# Patient Record
Sex: Male | Born: 1940
Health system: Southern US, Community
[De-identification: ages and names within clinical notes are randomized; demographics above are authoritative.]

## PROBLEM LIST (undated history)

## (undated) DIAGNOSIS — J189 Pneumonia, unspecified organism: Secondary | ICD-10-CM

## (undated) DIAGNOSIS — E785 Hyperlipidemia, unspecified: Secondary | ICD-10-CM

## (undated) DIAGNOSIS — I2510 Atherosclerotic heart disease of native coronary artery without angina pectoris: Secondary | ICD-10-CM

## (undated) DIAGNOSIS — J90 Pleural effusion, not elsewhere classified: Secondary | ICD-10-CM

## (undated) DIAGNOSIS — I4891 Unspecified atrial fibrillation: Secondary | ICD-10-CM

## (undated) DIAGNOSIS — I5023 Acute on chronic systolic (congestive) heart failure: Secondary | ICD-10-CM

## (undated) DIAGNOSIS — R05 Cough: Secondary | ICD-10-CM

## (undated) DIAGNOSIS — R42 Dizziness and giddiness: Secondary | ICD-10-CM

## (undated) DIAGNOSIS — K219 Gastro-esophageal reflux disease without esophagitis: Secondary | ICD-10-CM

## (undated) DIAGNOSIS — I255 Ischemic cardiomyopathy: Secondary | ICD-10-CM

## (undated) DIAGNOSIS — C801 Malignant (primary) neoplasm, unspecified: Secondary | ICD-10-CM

## (undated) DIAGNOSIS — Z7901 Long term (current) use of anticoagulants: Secondary | ICD-10-CM

## (undated) DIAGNOSIS — G459 Transient cerebral ischemic attack, unspecified: Secondary | ICD-10-CM

## (undated) DIAGNOSIS — I10 Essential (primary) hypertension: Secondary | ICD-10-CM

## (undated) DIAGNOSIS — IMO0002 Reserved for concepts with insufficient information to code with codable children: Secondary | ICD-10-CM

## (undated) DIAGNOSIS — R059 Cough, unspecified: Secondary | ICD-10-CM

## (undated) DIAGNOSIS — G4733 Obstructive sleep apnea (adult) (pediatric): Secondary | ICD-10-CM

## (undated) HISTORY — DX: Reserved for concepts with insufficient information to code with codable children: IMO0002

## (undated) HISTORY — DX: Pleural effusion, not elsewhere classified: J90

## (undated) HISTORY — PX: SHOULDER SURGERY: SHX246

## (undated) HISTORY — DX: Obstructive sleep apnea (adult) (pediatric): G47.33

## (undated) HISTORY — DX: Transient cerebral ischemic attack, unspecified: G45.9

## (undated) HISTORY — DX: Unspecified atrial fibrillation: I48.91

## (undated) HISTORY — DX: Acute on chronic systolic (congestive) heart failure: I50.23

## (undated) HISTORY — DX: Atherosclerotic heart disease of native coronary artery without angina pectoris: I25.10

## (undated) HISTORY — DX: Hyperlipidemia, unspecified: E78.5

## (undated) HISTORY — DX: Essential (primary) hypertension: I10

## (undated) HISTORY — DX: Gastro-esophageal reflux disease without esophagitis: K21.9

## (undated) HISTORY — DX: Ischemic cardiomyopathy: I25.5

## (undated) HISTORY — DX: Pneumonia, unspecified organism: J18.9

## (undated) HISTORY — DX: Dizziness and giddiness: R42

## (undated) HISTORY — DX: Cough, unspecified: R05.9

## (undated) HISTORY — DX: Cough: R05

---

## 1998-11-14 ENCOUNTER — Other Ambulatory Visit: Admission: RE | Admit: 1998-11-14 | Discharge: 1998-11-14 | Payer: Self-pay | Admitting: Urology

## 1999-07-10 ENCOUNTER — Other Ambulatory Visit: Admission: RE | Admit: 1999-07-10 | Discharge: 1999-07-10 | Payer: Self-pay | Admitting: Urology

## 2002-03-23 HISTORY — PX: CORONARY STENT PLACEMENT: SHX1402

## 2003-01-18 ENCOUNTER — Inpatient Hospital Stay (HOSPITAL_COMMUNITY): Admission: AD | Admit: 2003-01-18 | Discharge: 2003-01-22 | Payer: Self-pay | Admitting: Emergency Medicine

## 2003-01-19 ENCOUNTER — Encounter: Payer: Self-pay | Admitting: Internal Medicine

## 2003-01-23 ENCOUNTER — Inpatient Hospital Stay (HOSPITAL_COMMUNITY): Admission: EM | Admit: 2003-01-23 | Discharge: 2003-01-26 | Payer: Self-pay | Admitting: Emergency Medicine

## 2003-01-24 ENCOUNTER — Encounter: Payer: Self-pay | Admitting: Cardiovascular Disease

## 2003-02-05 ENCOUNTER — Encounter (HOSPITAL_COMMUNITY): Admission: RE | Admit: 2003-02-05 | Discharge: 2003-05-06 | Payer: Self-pay | Admitting: Emergency Medicine

## 2003-07-25 ENCOUNTER — Ambulatory Visit (HOSPITAL_COMMUNITY): Admission: RE | Admit: 2003-07-25 | Discharge: 2003-07-25 | Payer: Self-pay | Admitting: Cardiology

## 2004-04-16 ENCOUNTER — Ambulatory Visit: Payer: Self-pay | Admitting: Cardiology

## 2004-07-25 ENCOUNTER — Ambulatory Visit: Payer: Self-pay | Admitting: Cardiology

## 2004-08-13 ENCOUNTER — Ambulatory Visit: Payer: Self-pay | Admitting: Cardiology

## 2004-11-19 ENCOUNTER — Ambulatory Visit: Payer: Self-pay | Admitting: Cardiology

## 2005-06-11 ENCOUNTER — Ambulatory Visit: Payer: Self-pay | Admitting: Cardiology

## 2005-08-21 ENCOUNTER — Ambulatory Visit: Payer: Self-pay | Admitting: Cardiology

## 2005-11-27 ENCOUNTER — Ambulatory Visit: Payer: Self-pay | Admitting: Family Medicine

## 2006-02-26 ENCOUNTER — Ambulatory Visit: Payer: Self-pay | Admitting: Cardiology

## 2007-02-09 ENCOUNTER — Encounter: Admission: RE | Admit: 2007-02-09 | Discharge: 2007-02-09 | Payer: Self-pay | Admitting: Cardiology

## 2007-02-14 ENCOUNTER — Encounter: Payer: Self-pay | Admitting: Endocrinology

## 2007-02-14 ENCOUNTER — Ambulatory Visit: Payer: Self-pay | Admitting: Endocrinology

## 2007-02-14 DIAGNOSIS — I4891 Unspecified atrial fibrillation: Secondary | ICD-10-CM | POA: Insufficient documentation

## 2007-02-14 DIAGNOSIS — R42 Dizziness and giddiness: Secondary | ICD-10-CM | POA: Insufficient documentation

## 2007-02-15 ENCOUNTER — Ambulatory Visit: Payer: Self-pay | Admitting: Cardiovascular Disease

## 2007-02-21 ENCOUNTER — Ambulatory Visit: Payer: Self-pay | Admitting: Cardiology

## 2007-03-02 ENCOUNTER — Ambulatory Visit: Payer: Self-pay | Admitting: Cardiovascular Disease

## 2007-03-08 ENCOUNTER — Ambulatory Visit: Payer: Self-pay | Admitting: Cardiology

## 2007-03-08 LAB — CONVERTED CEMR LAB
BUN: 11 mg/dL (ref 6–23)
Basophils Absolute: 0 10*3/uL (ref 0.0–0.1)
Basophils Relative: 0.2 % (ref 0.0–1.0)
CO2: 29 meq/L (ref 19–32)
Calcium: 9.4 mg/dL (ref 8.4–10.5)
Chloride: 102 meq/L (ref 96–112)
Creatinine, Ser: 0.9 mg/dL (ref 0.4–1.5)
Eosinophils Absolute: 0.5 10*3/uL (ref 0.0–0.6)
Eosinophils Relative: 6.9 % — ABNORMAL HIGH (ref 0.0–5.0)
GFR calc Af Amer: 108 mL/min
GFR calc non Af Amer: 89 mL/min
Glucose, Bld: 93 mg/dL (ref 70–99)
HCT: 47.4 % (ref 39.0–52.0)
Hemoglobin: 16 g/dL (ref 13.0–17.0)
Lymphocytes Relative: 23.1 % (ref 12.0–46.0)
MCHC: 33.7 g/dL (ref 30.0–36.0)
MCV: 92.6 fL (ref 78.0–100.0)
Monocytes Absolute: 0.8 10*3/uL — ABNORMAL HIGH (ref 0.2–0.7)
Monocytes Relative: 10.3 % (ref 3.0–11.0)
Neutro Abs: 4.3 10*3/uL (ref 1.4–7.7)
Neutrophils Relative %: 59.5 % (ref 43.0–77.0)
Platelets: 203 10*3/uL (ref 150–400)
Potassium: 4.3 meq/L (ref 3.5–5.1)
RBC: 5.12 M/uL (ref 4.22–5.81)
RDW: 13.1 % (ref 11.5–14.6)
Sodium: 139 meq/L (ref 135–145)
TSH: 1.71 microintl units/mL (ref 0.35–5.50)
WBC: 7.3 10*3/uL (ref 4.5–10.5)

## 2007-03-16 ENCOUNTER — Ambulatory Visit: Payer: Self-pay | Admitting: Internal Medicine

## 2007-03-23 ENCOUNTER — Ambulatory Visit: Payer: Self-pay | Admitting: Cardiology

## 2007-03-30 ENCOUNTER — Ambulatory Visit: Payer: Self-pay | Admitting: Cardiovascular Disease

## 2007-04-04 ENCOUNTER — Ambulatory Visit: Payer: Self-pay | Admitting: Cardiology

## 2007-04-06 ENCOUNTER — Ambulatory Visit: Payer: Self-pay | Admitting: Cardiology

## 2007-04-06 LAB — CONVERTED CEMR LAB
ALT: 18 units/L (ref 0–53)
AST: 17 units/L (ref 0–37)
Albumin: 3.9 g/dL (ref 3.5–5.2)
Alkaline Phosphatase: 39 units/L (ref 39–117)
Bilirubin, Direct: 0.2 mg/dL (ref 0.0–0.3)
Cholesterol: 181 mg/dL (ref 0–200)
HDL: 56 mg/dL (ref >39.0)
LDL Cholesterol: 119 mg/dL — ABNORMAL HIGH (ref 0–99)
Total Bilirubin: 1.2 mg/dL (ref 0.3–1.2)
Total CHOL/HDL Ratio: 3.2
Total Protein: 7.3 g/dL (ref 6.0–8.3)
Triglycerides: 32 mg/dL (ref 0–149)
VLDL: 6 mg/dL (ref 0–40)

## 2007-04-14 ENCOUNTER — Ambulatory Visit: Payer: Self-pay | Admitting: Cardiology

## 2007-04-19 ENCOUNTER — Ambulatory Visit: Payer: Self-pay | Admitting: Cardiology

## 2007-04-25 ENCOUNTER — Ambulatory Visit: Payer: Self-pay | Admitting: Internal Medicine

## 2007-04-25 ENCOUNTER — Encounter: Payer: Self-pay | Admitting: Internal Medicine

## 2007-04-25 ENCOUNTER — Ambulatory Visit: Payer: Self-pay

## 2007-05-02 ENCOUNTER — Ambulatory Visit: Payer: Self-pay | Admitting: Cardiology

## 2007-05-02 ENCOUNTER — Ambulatory Visit: Payer: Self-pay | Admitting: Internal Medicine

## 2007-05-06 ENCOUNTER — Ambulatory Visit: Payer: Self-pay | Admitting: Cardiology

## 2007-05-11 ENCOUNTER — Ambulatory Visit: Payer: Self-pay | Admitting: Cardiology

## 2007-05-19 ENCOUNTER — Ambulatory Visit: Payer: Self-pay | Admitting: Cardiology

## 2007-05-20 ENCOUNTER — Ambulatory Visit (HOSPITAL_COMMUNITY): Admission: RE | Admit: 2007-05-20 | Discharge: 2007-05-20 | Payer: Self-pay | Admitting: Internal Medicine

## 2007-05-20 ENCOUNTER — Ambulatory Visit: Payer: Self-pay | Admitting: Cardiology

## 2007-05-25 ENCOUNTER — Ambulatory Visit: Payer: Self-pay | Admitting: Internal Medicine

## 2007-05-27 ENCOUNTER — Encounter (INDEPENDENT_AMBULATORY_CARE_PROVIDER_SITE_OTHER): Payer: Self-pay | Admitting: *Deleted

## 2007-05-27 ENCOUNTER — Ambulatory Visit (HOSPITAL_COMMUNITY): Admission: RE | Admit: 2007-05-27 | Discharge: 2007-05-27 | Payer: Self-pay | Admitting: Cardiology

## 2007-06-02 ENCOUNTER — Ambulatory Visit: Payer: Self-pay | Admitting: Cardiology

## 2007-06-02 LAB — CONVERTED CEMR LAB
BUN: 22 mg/dL (ref 6–23)
Basophils Absolute: 0 10*3/uL (ref 0.0–0.1)
Basophils Relative: 0.4 % (ref 0.0–1.0)
CO2: 29 meq/L (ref 19–32)
Calcium: 9.2 mg/dL (ref 8.4–10.5)
Chloride: 104 meq/L (ref 96–112)
Creatinine, Ser: 1 mg/dL (ref 0.4–1.5)
Eosinophils Absolute: 0.6 10*3/uL (ref 0.0–0.6)
Eosinophils Relative: 9.1 % — ABNORMAL HIGH (ref 0.0–5.0)
GFR calc Af Amer: 96 mL/min
GFR calc non Af Amer: 79 mL/min
Glucose, Bld: 119 mg/dL — ABNORMAL HIGH (ref 70–99)
HCT: 48.1 % (ref 39.0–52.0)
Hemoglobin: 15.6 g/dL (ref 13.0–17.0)
Lymphocytes Relative: 23.7 % (ref 12.0–46.0)
MCHC: 32.4 g/dL (ref 30.0–36.0)
MCV: 91.5 fL (ref 78.0–100.0)
Magnesium: 2.2 mg/dL (ref 1.5–2.5)
Monocytes Absolute: 0.6 10*3/uL (ref 0.2–0.7)
Monocytes Relative: 9.2 % (ref 3.0–11.0)
Neutro Abs: 4.1 10*3/uL (ref 1.4–7.7)
Neutrophils Relative %: 57.6 % (ref 43.0–77.0)
Platelets: 178 10*3/uL (ref 150–400)
Potassium: 4.4 meq/L (ref 3.5–5.1)
RBC: 5.26 M/uL (ref 4.22–5.81)
RDW: 14 % (ref 11.5–14.6)
Sodium: 140 meq/L (ref 135–145)
WBC: 6.9 10*3/uL (ref 4.5–10.5)
aPTT: 36 s — ABNORMAL HIGH (ref 21.7–29.8)

## 2007-06-06 ENCOUNTER — Ambulatory Visit (HOSPITAL_COMMUNITY): Admission: RE | Admit: 2007-06-06 | Discharge: 2007-06-06 | Payer: Self-pay | Admitting: Cardiology

## 2007-06-06 ENCOUNTER — Ambulatory Visit: Payer: Self-pay | Admitting: Cardiology

## 2007-06-22 ENCOUNTER — Ambulatory Visit: Payer: Self-pay | Admitting: Internal Medicine

## 2007-07-12 ENCOUNTER — Ambulatory Visit: Payer: Self-pay | Admitting: Cardiovascular Disease

## 2007-07-25 ENCOUNTER — Ambulatory Visit: Payer: Self-pay

## 2007-07-25 ENCOUNTER — Ambulatory Visit: Payer: Self-pay | Admitting: Internal Medicine

## 2007-08-16 ENCOUNTER — Ambulatory Visit: Payer: Self-pay | Admitting: Cardiology

## 2007-09-13 ENCOUNTER — Ambulatory Visit: Payer: Self-pay | Admitting: Cardiology

## 2007-09-27 ENCOUNTER — Ambulatory Visit: Payer: Self-pay | Admitting: Cardiovascular Disease

## 2007-10-04 ENCOUNTER — Ambulatory Visit: Payer: Self-pay | Admitting: Internal Medicine

## 2007-10-11 ENCOUNTER — Ambulatory Visit: Payer: Self-pay | Admitting: Cardiology

## 2007-10-25 ENCOUNTER — Ambulatory Visit: Payer: Self-pay | Admitting: Cardiology

## 2007-11-08 ENCOUNTER — Ambulatory Visit: Payer: Self-pay | Admitting: Cardiology

## 2007-11-29 ENCOUNTER — Ambulatory Visit: Payer: Self-pay | Admitting: Cardiology

## 2007-12-27 ENCOUNTER — Ambulatory Visit: Payer: Self-pay | Admitting: Cardiovascular Disease

## 2008-01-27 ENCOUNTER — Ambulatory Visit: Payer: Self-pay | Admitting: Cardiology

## 2008-02-23 ENCOUNTER — Ambulatory Visit: Payer: Self-pay | Admitting: Cardiology

## 2008-03-07 ENCOUNTER — Encounter: Payer: Self-pay | Admitting: Critical Care Medicine

## 2008-03-07 ENCOUNTER — Ambulatory Visit: Payer: Self-pay | Admitting: Critical Care Medicine

## 2008-03-07 DIAGNOSIS — J90 Pleural effusion, not elsewhere classified: Secondary | ICD-10-CM | POA: Insufficient documentation

## 2008-03-07 DIAGNOSIS — J189 Pneumonia, unspecified organism: Secondary | ICD-10-CM | POA: Insufficient documentation

## 2008-03-07 DIAGNOSIS — E785 Hyperlipidemia, unspecified: Secondary | ICD-10-CM | POA: Insufficient documentation

## 2008-03-07 DIAGNOSIS — J309 Allergic rhinitis, unspecified: Secondary | ICD-10-CM | POA: Insufficient documentation

## 2008-03-07 DIAGNOSIS — I2510 Atherosclerotic heart disease of native coronary artery without angina pectoris: Secondary | ICD-10-CM | POA: Insufficient documentation

## 2008-03-07 DIAGNOSIS — I10 Essential (primary) hypertension: Secondary | ICD-10-CM | POA: Insufficient documentation

## 2008-03-08 ENCOUNTER — Ambulatory Visit: Payer: Self-pay | Admitting: Critical Care Medicine

## 2008-03-11 DIAGNOSIS — K219 Gastro-esophageal reflux disease without esophagitis: Secondary | ICD-10-CM | POA: Insufficient documentation

## 2008-03-11 LAB — CONVERTED CEMR LAB
ALT: 23 units/L (ref 0–53)
AST: 25 units/L (ref 0–37)
Albumin: 3.6 g/dL (ref 3.5–5.2)
Alkaline Phosphatase: 41 units/L (ref 39–117)
BUN: 19 mg/dL (ref 6–23)
Basophils Absolute: 0 10*3/uL (ref 0.0–0.1)
Basophils Relative: 0.1 % (ref 0.0–3.0)
Bilirubin, Direct: 0.2 mg/dL (ref 0.0–0.3)
CK-MB: 3.7 ng/mL (ref 0.3–4.0)
CO2: 28 meq/L (ref 19–32)
CRP, High Sensitivity: 5 (ref 0.00–5.00)
Calcium: 9.3 mg/dL (ref 8.4–10.5)
Chloride: 106 meq/L (ref 96–112)
Creatinine, Ser: 1.1 mg/dL (ref 0.4–1.5)
Eosinophils Absolute: 0.2 10*3/uL (ref 0.0–0.7)
Eosinophils Relative: 2.4 % (ref 0.0–5.0)
GFR calc Af Amer: 85 mL/min
GFR calc non Af Amer: 71 mL/min
Glucose, Bld: 125 mg/dL — ABNORMAL HIGH (ref 70–99)
HCT: 44.1 % (ref 39.0–52.0)
Hemoglobin: 14.9 g/dL (ref 13.0–17.0)
Lymphocytes Relative: 19.5 % (ref 12.0–46.0)
MCHC: 33.8 g/dL (ref 30.0–36.0)
MCV: 92.7 fL (ref 78.0–100.0)
Monocytes Absolute: 0.5 10*3/uL (ref 0.1–1.0)
Monocytes Relative: 7.4 % (ref 3.0–12.0)
Neutro Abs: 4.7 10*3/uL (ref 1.4–7.7)
Neutrophils Relative %: 70.6 % (ref 43.0–77.0)
Platelets: 188 10*3/uL (ref 150–400)
Potassium: 4.5 meq/L (ref 3.5–5.1)
Pro B Natriuretic peptide (BNP): 1001 pg/mL — ABNORMAL HIGH (ref 0.0–100.0)
RBC: 4.76 M/uL (ref 4.22–5.81)
RDW: 14 % (ref 11.5–14.6)
Sed Rate: 15 mm/hr (ref 0–16)
Sodium: 138 meq/L (ref 135–145)
Total Bilirubin: 1.1 mg/dL (ref 0.3–1.2)
Total Protein: 6.7 g/dL (ref 6.0–8.3)
WBC: 6.7 10*3/uL (ref 4.5–10.5)

## 2008-03-15 ENCOUNTER — Ambulatory Visit: Payer: Self-pay | Admitting: Adult Health

## 2008-03-15 ENCOUNTER — Ambulatory Visit: Payer: Self-pay | Admitting: Pulmonary Disease

## 2008-03-22 ENCOUNTER — Ambulatory Visit: Payer: Self-pay | Admitting: Cardiology

## 2008-03-22 ENCOUNTER — Ambulatory Visit: Payer: Self-pay | Admitting: Critical Care Medicine

## 2008-04-04 ENCOUNTER — Ambulatory Visit: Payer: Self-pay | Admitting: Cardiology

## 2008-04-19 ENCOUNTER — Ambulatory Visit: Payer: Self-pay | Admitting: Internal Medicine

## 2008-04-19 LAB — CONVERTED CEMR LAB
ALT: 19 units/L (ref 0–53)
AST: 24 units/L (ref 0–37)
Albumin: 3.6 g/dL (ref 3.5–5.2)
Alkaline Phosphatase: 40 units/L (ref 39–117)
BUN: 17 mg/dL (ref 6–23)
Bilirubin, Direct: 0.1 mg/dL (ref 0.0–0.3)
CO2: 27 meq/L (ref 19–32)
Calcium: 9.1 mg/dL (ref 8.4–10.5)
Chloride: 104 meq/L (ref 96–112)
Cholesterol: 181 mg/dL (ref 0–200)
Creatinine, Ser: 0.9 mg/dL (ref 0.4–1.5)
GFR calc Af Amer: 108 mL/min
GFR calc non Af Amer: 89 mL/min
Glucose, Bld: 109 mg/dL — ABNORMAL HIGH (ref 70–99)
HDL: 49.6 mg/dL (ref >39.0)
Hgb A1c MFr Bld: 6.3 % — ABNORMAL HIGH (ref 4.6–6.0)
LDL Cholesterol: 122 mg/dL — ABNORMAL HIGH (ref 0–99)
Potassium: 4.2 meq/L (ref 3.5–5.1)
Pro B Natriuretic peptide (BNP): 473 pg/mL — ABNORMAL HIGH (ref 0.0–100.0)
Sodium: 139 meq/L (ref 135–145)
Total Bilirubin: 1.4 mg/dL — ABNORMAL HIGH (ref 0.3–1.2)
Total CHOL/HDL Ratio: 3.6
Total Protein: 7.1 g/dL (ref 6.0–8.3)
Triglycerides: 48 mg/dL (ref 0–149)
VLDL: 10 mg/dL (ref 0–40)

## 2008-04-30 ENCOUNTER — Telehealth: Payer: Self-pay | Admitting: Critical Care Medicine

## 2008-05-04 ENCOUNTER — Ambulatory Visit: Payer: Self-pay | Admitting: Internal Medicine

## 2008-06-01 ENCOUNTER — Ambulatory Visit: Payer: Self-pay | Admitting: Cardiovascular Disease

## 2008-06-20 ENCOUNTER — Ambulatory Visit: Payer: Self-pay

## 2008-06-20 ENCOUNTER — Ambulatory Visit: Payer: Self-pay | Admitting: Internal Medicine

## 2008-06-20 LAB — CONVERTED CEMR LAB
BUN: 12 mg/dL (ref 6–23)
CO2: 29 meq/L (ref 19–32)
Calcium: 9.1 mg/dL (ref 8.4–10.5)
Chloride: 106 meq/L (ref 96–112)
Creatinine, Ser: 0.9 mg/dL (ref 0.4–1.5)
GFR calc non Af Amer: 88.76 mL/min (ref >60)
Glucose, Bld: 102 mg/dL — ABNORMAL HIGH (ref 70–99)
Hgb A1c MFr Bld: 6.2 % (ref 4.6–6.5)
Magnesium: 2.2 mg/dL (ref 1.5–2.5)
Potassium: 4.2 meq/L (ref 3.5–5.1)
Sodium: 142 meq/L (ref 135–145)

## 2008-06-25 ENCOUNTER — Ambulatory Visit: Payer: Self-pay | Admitting: Internal Medicine

## 2008-06-25 ENCOUNTER — Observation Stay (HOSPITAL_COMMUNITY): Admission: AD | Admit: 2008-06-25 | Discharge: 2008-06-25 | Payer: Self-pay | Admitting: Internal Medicine

## 2008-06-28 ENCOUNTER — Ambulatory Visit: Payer: Self-pay | Admitting: Cardiology

## 2008-07-02 ENCOUNTER — Ambulatory Visit: Payer: Self-pay | Admitting: Internal Medicine

## 2008-07-09 ENCOUNTER — Ambulatory Visit: Payer: Self-pay | Admitting: Cardiology

## 2008-07-13 DIAGNOSIS — H8110 Benign paroxysmal vertigo, unspecified ear: Secondary | ICD-10-CM | POA: Insufficient documentation

## 2008-07-13 DIAGNOSIS — I2589 Other forms of chronic ischemic heart disease: Secondary | ICD-10-CM | POA: Insufficient documentation

## 2008-07-16 ENCOUNTER — Ambulatory Visit: Payer: Self-pay | Admitting: Cardiology

## 2008-07-19 ENCOUNTER — Telehealth: Payer: Self-pay | Admitting: Internal Medicine

## 2008-07-26 ENCOUNTER — Ambulatory Visit: Payer: Self-pay | Admitting: Internal Medicine

## 2008-07-26 ENCOUNTER — Ambulatory Visit: Payer: Self-pay | Admitting: Cardiology

## 2008-07-26 ENCOUNTER — Encounter: Payer: Self-pay | Admitting: Cardiology

## 2008-07-26 LAB — CONVERTED CEMR LAB
BUN: 14 mg/dL (ref 6–23)
CO2: 31 meq/L (ref 19–32)
Calcium: 9.3 mg/dL (ref 8.4–10.5)
Chloride: 105 meq/L (ref 96–112)
Creatinine, Ser: 0.9 mg/dL (ref 0.4–1.5)
Digitoxin Lvl: 0.5 ng/mL — ABNORMAL LOW (ref 0.8–2.0)
GFR calc non Af Amer: 88.73 mL/min (ref >60)
Glucose, Bld: 114 mg/dL — ABNORMAL HIGH (ref 70–99)
Magnesium: 2.3 mg/dL (ref 1.5–2.5)
Potassium: 4.2 meq/L (ref 3.5–5.1)
Sodium: 140 meq/L (ref 135–145)

## 2008-07-30 ENCOUNTER — Telehealth: Payer: Self-pay | Admitting: Internal Medicine

## 2008-07-30 ENCOUNTER — Inpatient Hospital Stay (HOSPITAL_COMMUNITY): Admission: AD | Admit: 2008-07-30 | Discharge: 2008-08-02 | Payer: Self-pay | Admitting: Internal Medicine

## 2008-07-30 ENCOUNTER — Ambulatory Visit: Payer: Self-pay | Admitting: Cardiovascular Disease

## 2008-07-30 ENCOUNTER — Ambulatory Visit: Payer: Self-pay | Admitting: Internal Medicine

## 2008-08-02 ENCOUNTER — Telehealth: Payer: Self-pay | Admitting: Internal Medicine

## 2008-08-03 ENCOUNTER — Telehealth: Payer: Self-pay | Admitting: Internal Medicine

## 2008-08-07 ENCOUNTER — Ambulatory Visit: Payer: Self-pay | Admitting: Internal Medicine

## 2008-08-07 ENCOUNTER — Telehealth: Payer: Self-pay | Admitting: Internal Medicine

## 2008-08-07 ENCOUNTER — Ambulatory Visit (HOSPITAL_COMMUNITY): Admission: RE | Admit: 2008-08-07 | Discharge: 2008-08-07 | Payer: Self-pay | Admitting: Internal Medicine

## 2008-08-15 ENCOUNTER — Ambulatory Visit: Payer: Self-pay | Admitting: Internal Medicine

## 2008-08-15 DIAGNOSIS — I495 Sick sinus syndrome: Secondary | ICD-10-CM | POA: Insufficient documentation

## 2008-08-18 ENCOUNTER — Ambulatory Visit: Payer: Self-pay | Admitting: Cardiology

## 2008-08-18 ENCOUNTER — Observation Stay (HOSPITAL_COMMUNITY): Admission: EM | Admit: 2008-08-18 | Discharge: 2008-08-18 | Payer: Self-pay | Admitting: Cardiology

## 2008-08-18 ENCOUNTER — Encounter: Payer: Self-pay | Admitting: Internal Medicine

## 2008-08-21 ENCOUNTER — Encounter: Payer: Self-pay | Admitting: *Deleted

## 2008-09-13 ENCOUNTER — Encounter (INDEPENDENT_AMBULATORY_CARE_PROVIDER_SITE_OTHER): Payer: Self-pay | Admitting: *Deleted

## 2008-09-13 ENCOUNTER — Ambulatory Visit: Payer: Self-pay | Admitting: Cardiology

## 2008-09-13 LAB — CONVERTED CEMR LAB
POC INR: 1.6
Prothrombin Time: 15.6 s

## 2008-09-19 ENCOUNTER — Telehealth: Payer: Self-pay | Admitting: Internal Medicine

## 2008-09-26 ENCOUNTER — Encounter: Payer: Self-pay | Admitting: *Deleted

## 2008-09-28 ENCOUNTER — Ambulatory Visit: Payer: Self-pay | Admitting: Cardiology

## 2008-09-28 ENCOUNTER — Encounter (INDEPENDENT_AMBULATORY_CARE_PROVIDER_SITE_OTHER): Payer: Self-pay | Admitting: Cardiology

## 2008-09-28 LAB — CONVERTED CEMR LAB
POC INR: 1.9
Prothrombin Time: 17.1 s

## 2008-10-10 ENCOUNTER — Telehealth: Payer: Self-pay | Admitting: Internal Medicine

## 2008-10-19 ENCOUNTER — Ambulatory Visit: Payer: Self-pay | Admitting: Cardiology

## 2008-10-19 LAB — CONVERTED CEMR LAB
POC INR: 2
Prothrombin Time: 17.3 s

## 2008-10-24 ENCOUNTER — Ambulatory Visit: Payer: Self-pay | Admitting: Internal Medicine

## 2008-10-24 ENCOUNTER — Ambulatory Visit: Payer: Self-pay | Admitting: Cardiology

## 2008-10-24 LAB — CONVERTED CEMR LAB
POC INR: 2
Prothrombin Time: 17.5 s

## 2008-10-26 ENCOUNTER — Telehealth: Payer: Self-pay | Admitting: Internal Medicine

## 2008-11-21 ENCOUNTER — Ambulatory Visit: Payer: Self-pay | Admitting: Internal Medicine

## 2008-11-21 ENCOUNTER — Encounter (INDEPENDENT_AMBULATORY_CARE_PROVIDER_SITE_OTHER): Payer: Self-pay | Admitting: Cardiology

## 2008-11-21 LAB — CONVERTED CEMR LAB: POC INR: 1.7

## 2008-12-03 ENCOUNTER — Telehealth: Payer: Self-pay | Admitting: Family Medicine

## 2008-12-04 ENCOUNTER — Ambulatory Visit: Payer: Self-pay | Admitting: Cardiology

## 2008-12-04 LAB — CONVERTED CEMR LAB: POC INR: 2.3

## 2008-12-12 ENCOUNTER — Ambulatory Visit: Payer: Self-pay | Admitting: Family Medicine

## 2008-12-12 LAB — CONVERTED CEMR LAB
ALT: 24 units/L (ref 0–53)
AST: 28 units/L (ref 0–37)
Albumin: 3.6 g/dL (ref 3.5–5.2)
Alkaline Phosphatase: 42 units/L (ref 39–117)
BUN: 12 mg/dL (ref 6–23)
Basophils Absolute: 0 10*3/uL (ref 0.0–0.1)
Basophils Relative: 0.6 % (ref 0.0–3.0)
Bilirubin Urine: NEGATIVE
Bilirubin, Direct: 0.1 mg/dL (ref 0.0–0.3)
CO2: 32 meq/L (ref 19–32)
CRP, High Sensitivity: 8.4 — ABNORMAL HIGH (ref 0.00–5.00)
Calcium: 9.3 mg/dL (ref 8.4–10.5)
Chloride: 106 meq/L (ref 96–112)
Cholesterol: 226 mg/dL — ABNORMAL HIGH (ref 0–200)
Creatinine, Ser: 0.8 mg/dL (ref 0.4–1.5)
Direct LDL: 150.9 mg/dL
Eosinophils Absolute: 0.7 10*3/uL (ref 0.0–0.7)
Eosinophils Relative: 13.4 % — ABNORMAL HIGH (ref 0.0–5.0)
GFR calc non Af Amer: 101.54 mL/min (ref >60)
Glucose, Bld: 95 mg/dL (ref 70–99)
HCT: 44.2 % (ref 39.0–52.0)
HDL: 47.9 mg/dL (ref >39.00)
Hemoglobin, Urine: NEGATIVE
Hemoglobin: 14.6 g/dL (ref 13.0–17.0)
Hgb A1c MFr Bld: 5.9 % (ref 4.6–6.5)
Ketones, ur: NEGATIVE mg/dL
Lymphocytes Relative: 28.6 % (ref 12.0–46.0)
Lymphs Abs: 1.5 10*3/uL (ref 0.7–4.0)
MCHC: 33.1 g/dL (ref 30.0–36.0)
MCV: 94.4 fL (ref 78.0–100.0)
Monocytes Absolute: 0.4 10*3/uL (ref 0.1–1.0)
Monocytes Relative: 8.6 % (ref 3.0–12.0)
Neutro Abs: 2.6 10*3/uL (ref 1.4–7.7)
Neutrophils Relative %: 48.8 % (ref 43.0–77.0)
Nitrite: NEGATIVE
PSA: 10.69 ng/mL — ABNORMAL HIGH (ref 0.10–4.00)
Platelets: 182 10*3/uL (ref 150.0–400.0)
Potassium: 4.5 meq/L (ref 3.5–5.1)
RBC: 4.69 M/uL (ref 4.22–5.81)
RDW: 13.8 % (ref 11.5–14.6)
Sodium: 143 meq/L (ref 135–145)
Specific Gravity, Urine: 1.01 (ref 1.000–1.030)
TSH: 1.54 microintl units/mL (ref 0.35–5.50)
Testosterone: 493.28 ng/dL (ref 350.00–890.00)
Total Bilirubin: 1.2 mg/dL (ref 0.3–1.2)
Total CHOL/HDL Ratio: 5
Total Protein, Urine: NEGATIVE mg/dL
Total Protein: 7.6 g/dL (ref 6.0–8.3)
Triglycerides: 33 mg/dL (ref 0.0–149.0)
Urine Glucose: NEGATIVE mg/dL
Urobilinogen, UA: 0.2 (ref 0.0–1.0)
VLDL: 6.6 mg/dL (ref 0.0–40.0)
Vit D, 25-Hydroxy: 82 ng/mL (ref 30–89)
Vitamin B-12: 585 pg/mL (ref 211–911)
WBC: 5.2 10*3/uL (ref 4.5–10.5)
pH: 7.5 (ref 5.0–8.0)

## 2008-12-17 ENCOUNTER — Ambulatory Visit: Payer: Self-pay | Admitting: Family Medicine

## 2008-12-17 DIAGNOSIS — R972 Elevated prostate specific antigen [PSA]: Secondary | ICD-10-CM | POA: Insufficient documentation

## 2008-12-18 ENCOUNTER — Ambulatory Visit: Payer: Self-pay | Admitting: Cardiology

## 2008-12-18 LAB — CONVERTED CEMR LAB: POC INR: 2.2

## 2009-01-15 ENCOUNTER — Ambulatory Visit: Payer: Self-pay | Admitting: Gastroenterology

## 2009-01-15 ENCOUNTER — Ambulatory Visit: Payer: Self-pay | Admitting: Cardiovascular Disease

## 2009-01-15 LAB — CONVERTED CEMR LAB: POC INR: 2

## 2009-01-28 ENCOUNTER — Telehealth: Payer: Self-pay | Admitting: Family Medicine

## 2009-01-30 ENCOUNTER — Ambulatory Visit: Payer: Self-pay | Admitting: Internal Medicine

## 2009-01-30 LAB — CONVERTED CEMR LAB: POC INR: 1.8

## 2009-02-13 ENCOUNTER — Ambulatory Visit: Payer: Self-pay | Admitting: Internal Medicine

## 2009-02-13 LAB — CONVERTED CEMR LAB: POC INR: 2

## 2009-03-04 ENCOUNTER — Ambulatory Visit: Payer: Self-pay | Admitting: Cardiovascular Disease

## 2009-03-04 LAB — CONVERTED CEMR LAB
INR: 2.4
POC INR: 2.4

## 2009-04-16 ENCOUNTER — Ambulatory Visit: Payer: Self-pay | Admitting: Cardiovascular Disease

## 2009-04-16 LAB — CONVERTED CEMR LAB: POC INR: 2

## 2009-05-09 ENCOUNTER — Telehealth: Payer: Self-pay | Admitting: Internal Medicine

## 2009-05-10 ENCOUNTER — Ambulatory Visit: Payer: Self-pay | Admitting: Cardiology

## 2009-05-10 LAB — CONVERTED CEMR LAB: POC INR: 1.9

## 2009-05-13 ENCOUNTER — Telehealth (INDEPENDENT_AMBULATORY_CARE_PROVIDER_SITE_OTHER): Payer: Self-pay | Admitting: *Deleted

## 2009-06-07 ENCOUNTER — Ambulatory Visit: Payer: Self-pay | Admitting: Cardiovascular Disease

## 2009-06-07 LAB — CONVERTED CEMR LAB: POC INR: 2

## 2009-06-13 ENCOUNTER — Telehealth (INDEPENDENT_AMBULATORY_CARE_PROVIDER_SITE_OTHER): Payer: Self-pay | Admitting: *Deleted

## 2009-06-25 ENCOUNTER — Ambulatory Visit: Payer: Self-pay | Admitting: Internal Medicine

## 2009-06-25 DIAGNOSIS — R0989 Other specified symptoms and signs involving the circulatory and respiratory systems: Secondary | ICD-10-CM

## 2009-06-25 DIAGNOSIS — R0609 Other forms of dyspnea: Secondary | ICD-10-CM | POA: Insufficient documentation

## 2009-06-26 ENCOUNTER — Telehealth: Payer: Self-pay | Admitting: Internal Medicine

## 2009-07-05 ENCOUNTER — Encounter: Payer: Self-pay | Admitting: Internal Medicine

## 2009-07-05 ENCOUNTER — Ambulatory Visit: Payer: Self-pay

## 2009-07-05 ENCOUNTER — Ambulatory Visit: Payer: Self-pay | Admitting: Internal Medicine

## 2009-07-05 ENCOUNTER — Ambulatory Visit: Payer: Self-pay | Admitting: Cardiovascular Disease

## 2009-07-05 ENCOUNTER — Ambulatory Visit (HOSPITAL_COMMUNITY): Admission: RE | Admit: 2009-07-05 | Discharge: 2009-07-05 | Payer: Self-pay | Admitting: Internal Medicine

## 2009-07-05 LAB — CONVERTED CEMR LAB: POC INR: 2

## 2009-07-15 ENCOUNTER — Telehealth: Payer: Self-pay | Admitting: Internal Medicine

## 2009-07-18 ENCOUNTER — Telehealth: Payer: Self-pay | Admitting: Internal Medicine

## 2009-07-23 ENCOUNTER — Telehealth: Payer: Self-pay | Admitting: Internal Medicine

## 2009-08-12 ENCOUNTER — Encounter: Payer: Self-pay | Admitting: Cardiology

## 2009-08-15 ENCOUNTER — Telehealth: Payer: Self-pay | Admitting: Internal Medicine

## 2009-10-28 ENCOUNTER — Telehealth: Payer: Self-pay | Admitting: Internal Medicine

## 2009-10-29 DIAGNOSIS — G4733 Obstructive sleep apnea (adult) (pediatric): Secondary | ICD-10-CM | POA: Insufficient documentation

## 2009-11-01 ENCOUNTER — Telehealth: Payer: Self-pay | Admitting: Internal Medicine

## 2009-11-15 ENCOUNTER — Ambulatory Visit: Payer: Self-pay | Admitting: Pulmonary Disease

## 2009-12-20 ENCOUNTER — Ambulatory Visit: Payer: Self-pay | Admitting: Internal Medicine

## 2009-12-23 ENCOUNTER — Telehealth: Payer: Self-pay | Admitting: Internal Medicine

## 2010-03-11 ENCOUNTER — Ambulatory Visit: Payer: Self-pay | Admitting: Family Medicine

## 2010-03-11 ENCOUNTER — Telehealth: Payer: Self-pay | Admitting: Family Medicine

## 2010-03-11 LAB — CONVERTED CEMR LAB: Rapid Strep: NEGATIVE

## 2010-03-13 ENCOUNTER — Telehealth: Payer: Self-pay | Admitting: Family Medicine

## 2010-03-14 ENCOUNTER — Telehealth: Payer: Self-pay | Admitting: Internal Medicine

## 2010-03-14 ENCOUNTER — Telehealth: Payer: Self-pay | Admitting: Family Medicine

## 2010-03-26 ENCOUNTER — Telehealth: Payer: Self-pay | Admitting: Family Medicine

## 2010-03-31 ENCOUNTER — Ambulatory Visit
Admission: RE | Admit: 2010-03-31 | Discharge: 2010-03-31 | Payer: Self-pay | Source: Home / Self Care | Attending: Critical Care Medicine | Admitting: Critical Care Medicine

## 2010-03-31 ENCOUNTER — Telehealth (INDEPENDENT_AMBULATORY_CARE_PROVIDER_SITE_OTHER): Payer: Self-pay | Admitting: *Deleted

## 2010-04-01 ENCOUNTER — Ambulatory Visit
Admission: RE | Admit: 2010-04-01 | Discharge: 2010-04-01 | Payer: Self-pay | Source: Home / Self Care | Attending: Internal Medicine | Admitting: Internal Medicine

## 2010-04-01 ENCOUNTER — Other Ambulatory Visit: Payer: Self-pay | Admitting: Internal Medicine

## 2010-04-01 ENCOUNTER — Encounter: Payer: Self-pay | Admitting: Internal Medicine

## 2010-04-01 LAB — MAGNESIUM: Magnesium: 2.1 mg/dL (ref 1.5–2.5)

## 2010-04-01 LAB — BASIC METABOLIC PANEL
BUN: 15 mg/dL (ref 6–23)
CO2: 28 mEq/L (ref 19–32)
Calcium: 9 mg/dL (ref 8.4–10.5)
Chloride: 102 mEq/L (ref 96–112)
Creatinine, Ser: 0.7 mg/dL (ref 0.4–1.5)
GFR: 110.68 mL/min (ref >60.00)
Glucose, Bld: 89 mg/dL (ref 70–99)
Potassium: 4.6 mEq/L (ref 3.5–5.1)
Sodium: 137 mEq/L (ref 135–145)

## 2010-04-08 ENCOUNTER — Ambulatory Visit
Admission: RE | Admit: 2010-04-08 | Discharge: 2010-04-08 | Payer: Self-pay | Source: Home / Self Care | Attending: Internal Medicine | Admitting: Internal Medicine

## 2010-04-08 ENCOUNTER — Encounter: Payer: Self-pay | Admitting: Physician Assistant

## 2010-04-20 LAB — CONVERTED CEMR LAB
BUN: 11 mg/dL (ref 6–23)
BUN: 13 mg/dL (ref 6–23)
BUN: 14 mg/dL (ref 6–23)
BUN: 16 mg/dL (ref 6–23)
CO2: 24 meq/L (ref 19–32)
CO2: 27 meq/L (ref 19–32)
CO2: 30 meq/L (ref 19–32)
CO2: 31 meq/L (ref 19–32)
CRP, High Sensitivity: 4.6 (ref 0.00–5.00)
CRP, High Sensitivity: 6.4 — ABNORMAL HIGH (ref 0.00–5.00)
CRP: 0.4 mg/dL (ref <0.6)
Calcium: 9.1 mg/dL (ref 8.4–10.5)
Calcium: 9.1 mg/dL (ref 8.4–10.5)
Calcium: 9.1 mg/dL (ref 8.4–10.5)
Calcium: 9.3 mg/dL (ref 8.4–10.5)
Chloride: 101 meq/L (ref 96–112)
Chloride: 101 meq/L (ref 96–112)
Chloride: 101 meq/L (ref 96–112)
Chloride: 104 meq/L (ref 96–112)
Creatinine, Ser: 0.8 mg/dL (ref 0.4–1.5)
Creatinine, Ser: 0.8 mg/dL (ref 0.4–1.5)
Creatinine, Ser: 0.9 mg/dL (ref 0.4–1.5)
Creatinine, Ser: 0.93 mg/dL (ref 0.40–1.50)
GFR calc non Af Amer: 101.5 mL/min (ref >60)
GFR calc non Af Amer: 101.58 mL/min (ref >60)
GFR calc non Af Amer: 88.5 mL/min (ref >60)
Glucose, Bld: 118 mg/dL — ABNORMAL HIGH (ref 70–99)
Glucose, Bld: 57 mg/dL — ABNORMAL LOW (ref 70–99)
Glucose, Bld: 87 mg/dL (ref 70–99)
Glucose, Bld: 93 mg/dL (ref 70–99)
Hgb A1c MFr Bld: 5.6 % (ref <5.7)
Hgb A1c MFr Bld: 5.8 % (ref 4.6–6.5)
Magnesium: 2 mg/dL (ref 1.5–2.5)
Magnesium: 2 mg/dL (ref 1.5–2.5)
Magnesium: 2.1 mg/dL (ref 1.5–2.5)
Magnesium: 2.3 mg/dL (ref 1.5–2.5)
Potassium: 4.2 meq/L (ref 3.5–5.1)
Potassium: 4.3 meq/L (ref 3.5–5.1)
Potassium: 4.4 meq/L (ref 3.5–5.1)
Potassium: 4.4 meq/L (ref 3.5–5.3)
Sodium: 137 meq/L (ref 135–145)
Sodium: 138 meq/L (ref 135–145)
Sodium: 138 meq/L (ref 135–145)
Sodium: 138 meq/L (ref 135–145)

## 2010-04-22 NOTE — Procedures (Signed)
Summary: Respiration Report   Respiration Report   Imported By: Sallee Provencal 09/10/2009 16:22:29  _____________________________________________________________________  External Attachment:    Type:   Image     Comment:   External Document

## 2010-04-22 NOTE — Medication Information (Signed)
Summary: rov/eac  Anticoagulant Therapy  Managed by: Porfirio Oar, PharmD Referring MD: Eustace Quail MD PCP: Stevie Kern, MD Supervising MD: Caryl Comes MD, Remo Lipps Indication 1: Atrial Fibrillation (ICD-427.31) Lab Used: [facility] Site: [facility] INR POC 2.0 INR RANGE 2 - 2.5  Dietary changes: no    Health status changes: no    Bleeding/hemorrhagic complications: no    Recent/future hospitalizations: no    Any changes in medication regimen? no    Recent/future dental: no  Any missed doses?: no       Is patient compliant with meds? yes       Allergies: 1)  ! Sulfa  Anticoagulation Management History:      The patient is taking warfarin and comes in today for a routine follow up visit.  Positive risk factors for bleeding include an age of 22 years or older.  The bleeding index is 'intermediate risk'.  Positive CHADS2 values include History of HTN.  Negative CHADS2 values include Age > 46 years old.  The start date was 02/07/2007.  His last INR was 2.4.  Anticoagulation responsible provider: Caryl Comes MD, Remo Lipps.  INR POC: 2.0.  Cuvette Lot#: [id number].  Exp: 07/2010.    Anticoagulation Management Assessment/Plan:      The patient's current anticoagulation dose is Warfarin sodium 4 mg tabs: Take as directed.  The target INR is 2.0-2.5.  The next INR is due 08/02/2009.  Anticoagulation instructions were given to patient.  Results were reviewed/authorized by Porfirio Oar, PharmD.  He was notified by Porfirio Oar PharmD.         Prior Anticoagulation Instructions: INR 2.0  Continue taking 2 tablets on Wednesday and Saturday and 1.5 tablets all other days.  REturn to clinic in 4 weeks.    Current Anticoagulation Instructions: INR 2.0  Continue same dose of 1 1/2 tablets every day except 2 tablets on Wednesday and Saturday   Appended Document: rov/eac Pt having prostate biopsy on 5/4. Per [name], will need to be bridged with Lovenox.  Pt has been given the following instructions:     Take last dose of Coumadin on 4/28 Do not take any anticoagulants on 4/29 Start Lovenox 151m daily on 4/30 and continue until 5/3  Do not take anything the morning of your procedure. Your MD should let you know if you can restart your Lovenox and Coumadin on 5/4 or 5/5.   Continue normal dose of Coumadin and Lovenox until next appt on 5/9.   Appended Document: rov/eac    Clinical Lists Changes  Medications: Added new medication of LOVENOX 120 MG/0.8ML SOLN (ENOXAPARIN SODIUM) Inject 1 syringe subcutaneously daily as directed by Anticoagulation Clinic - Signed Rx of LOVENOX 120 MG/0.8ML SOLN (ENOXAPARIN SODIUM) Inject 1 syringe subcutaneously daily as directed by Anticoagulation Clinic;  #10 x 1;  Signed;  Entered by: SPorfirio OarPharmD;  Authorized by: SNikki Dom MD, [facility]  [facility] [address] [place] [id number] Ph: [phone] Fax: [phone]   Prescriptions: LOVENOX 120 MG/0.8ML SOLN (ENOXAPARIN SODIUM) Inject 1 syringe subcutaneously daily as directed by Anticoagulation Clinic  #10 x 1   Entered by:   SPorfirio OarPharmD   Authorized by:   SNikki Dom MD, [facility]  Signed by:   SPorfirio OarPharmD on 07/05/2009   Method used:   Electronically to        [facility](retail)       [address]  [place], [place]  [id number]       Ph: [phone]       Fax: [phone]   RxID:   [id number]

## 2010-04-22 NOTE — Progress Notes (Signed)
Summary: Pt request call  Phone Note Call from Patient Call back at Home Phone [phone]   Caller: Patient Summary of Call: Pt request call Initial call taken by: Delsa Sale,  Jul 23, 2009 3:36 PM  Follow-up for Phone Call        [facility] to return call Janan Halter, RN, BSN  Jul 25, 2009 1:55 PM was questioning about holding Pradaxa for a Prostate bx  He and Dr Risa Grill "winged It" Janan Halter, RN, BSN  Jul 25, 2009 4:04 PM

## 2010-04-22 NOTE — Progress Notes (Signed)
  Pt Signed ROI on 06/07/09, I copied Records form him he will p/u on 4/11 when he see's [place]  June 13, 2009 11:54 AM    Appended Document:  Pt picked up Records today.Marland KitchenMarland KitchenKm

## 2010-04-22 NOTE — Assessment & Plan Note (Signed)
Summary: sleep apnea/apc   Visit Type:  Initial Consult Copy to:  Dr. Virl Axe Primary Provider/Referring Provider:  Stevie Kern, MD  CC:  Sleep consult.  Home sleep test already done.  Epworth score is 11.Marland Kitchen  History of Present Illness: 70 yo male for sleep evaluation.  He is followed by cardiology for his coronary artery disease, systolic dysfunction, and atrial fibrillation.  He is being evaluated for ablation of his arrhythmia.  During his recent cardiac follow up concern was raised as to whether he could have sleep disordered breathing as a contributing risk factor.  As a result he had a home sleep test on April 15 and 16.  This showed an AHI of 7.4 and 8.2.  This prompted further sleep evaluation.  He goes to bed at 1030pm, and falls asleep quickly.  He does not use anything to help him sleep.  He is up several times to use the bathroom.  He attributes this to age and prostate problems.  He gets out of bed at 7am.  He will sometimes feel tired when he gets up, but denies morning headache.  He does not nap, but will get sleepy in the late afternoon.  He can fall asleep easily if he is reading a magazine.  He is not using anything to help him stay awake.  He does snore.  He tends to sleep on his side.  He will get a dry mouth.  He will occasional talk in his sleep.  He tends to remember his dreams when he has them.    He denies sleep walking, or bruxism.  There is no history of restless legs. He denies sleep hallucinations, sleep paralysis, or cataplexy.  He does not drink alcohol to help sleep.  His weight has been steady.  There is no history of thyroid disease.  He has not noticed any problem with his mood.  Preventive Screening-Counseling & Management  Alcohol-Tobacco     Alcohol drinks/day: 1     Alcohol type: wine     Smoking Status: never  Current Medications (verified): 1)  Metoprolol Succinate 25 Mg Xr24h-Tab (Metoprolol Succinate) .... Take One-Half  Tablet By Mouth  Daily 2)  Pradaxa 150 Mg Caps (Dabigatran Etexilate Mesylate) .... As Directed 3)  Diovan 40 Mg Tabs (Valsartan) .Marland Kitchen.. 1 By Mouth Two Times A Day 4)  Tikosyn 500 Mcg Caps (Dofetilide) .... Take One Tablet Q12 Hours 5)  The Very Finest Fish Oil  Liqd (Omega-3 Fatty Acids) .... Take 2 To 3 Grams Daily 6)  Nitroglycerin 0.4 Mg Subl (Nitroglycerin) .... One Tablet Under Tongue Every 5 Minutes As Needed For Chest Pain---May Repeat Times Three  Allergies (verified): 1)  ! Sulfa  Past History:  Past Medical History: CORONARY HEART DISEASE (ICD-414.00) ATRIAL FIBRILLATION (ICD-427.31) CARDIOMYOPATHY, ISCHEMIC (XBD-532.9) SYSTOLIC HEART FAILURE, ACUTE ON CHRONIC (ICD-428.23) HYPERTENSION (ICD-401.9) HYPERLIPIDEMIA (ICD-272.4) PLEURAL EFFUSION (ICD-511.9) POSITIONAL VERTIGO (ICD-386.11) DIZZINESS (ICD-780.4) GERD (ICD-530.81) PNEUMONIA, ATYPICAL (ICD-486) COUGH (ICD-786.2) ALLERGIC RHINITIS (ICD-477.9) TIAs  OSA      - home sleep test 07/05/09 AHI 8.2  Past Surgical History: Reviewed history from 07/13/2008 and no changes required. Cor stent LAD 2004 Shoulder   Family History: Reviewed history from 03/07/2008 and no changes required. 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  Alcohol Use - yes socially Regular Exercise - yes Drug Use - no Alcohol drinks/day:  1  Review of Systems  The patient complains of shortness of breath with activity and hand/feet swelling.  The patient denies shortness of breath at rest, productive cough, non-productive cough, coughing up blood, chest pain, irregular heartbeats, acid heartburn, indigestion, loss of appetite, weight change, abdominal pain, difficulty swallowing, sore throat, tooth/dental problems, headaches, nasal congestion/difficulty breathing through nose, sneezing, itching, ear ache, anxiety, depression, joint stiffness or  pain, rash, change in color of mucus, and fever.    Vital Signs:  Patient profile:   70 year old male Height:      69 inches (175.26 cm) Weight:      173.25 pounds (78.75 kg) BMI:     25.68 O2 Sat:      98 % on Room air Temp:     98.6 degrees F (37.00 degrees C) oral Pulse rate:   60 / minute BP sitting:   120 / 60  (left arm) Cuff size:   regular  Vitals Entered By: Francesca Jewett CMA (November 15, 2009 2:01 PM)  O2 Sat at Rest %:  98 O2 Flow:  Room air CC: Sleep consult.  Home sleep test already done.  Epworth score is 11. Comments Medications reviewed with the patient. Daytime phone verified. Francesca Jewett CMA  November 15, 2009 2:10 PM   Physical Exam  General:  normal appearance, healthy appearing, and thin.   Eyes:  PERRLA and EOMI, wears glasses Nose:  no deformity, discharge, inflammation, or lesions Mouth:  Retrognathic, MP 3, enlarged tongue Neck:  no JVD.   Chest Machaela Caterino:  no deformities noted Lungs:  clear bilaterally to auscultation and percussion Heart:  regular rate and rhythm, S1, S2 without murmurs, rubs, gallops, or clicks Abdomen:  bowel sounds positive; abdomen soft and non-tender without masses, or organomegaly Msk:  no deformity or scoliosis noted with normal posture Pulses:  pulses normal Extremities:  minimal ankle edema Neurologic:  normal CN II-XII and strength normal.   Cervical Nodes:  no significant adenopathy Psych:  alert and cooperative; normal mood and affect; normal attention span and concentration   Impression & Recommendations:  Problem # 1:  OBSTRUCTIVE SLEEP APNEA (ICD-327.23) He has mild sleep apnea.  He also has an extensive history of cardiovascular disease and arrhythmia.  He is being evaluated for ablation for his atrial fibrillation.  I reviewed his sleep test results with him.  I explained in detail about how sleep apnea can affect his health, particularly with regard to his cardiac disease.  Driving precautions were reviewed.  I  explained the importance of maintaining a reasonable weight.  He is already sleeping on his side.  Treatment options were reviewed.  He would like to consider his options further.  He would favor using an oral appliance if possible.  I have advised him to check with his dentist and insurance provider about whether he would be able to get an oral appliance.  Advised him that he would need repeat sleep testing with device in place if he opts for a mandibular advancement device.  Medications Added to Medication List This Visit: 1)  Pradaxa 150 Mg Caps (Dabigatran etexilate mesylate) .... As directed  Complete Medication List: 1)  Metoprolol Succinate 25 Mg Xr24h-tab (Metoprolol succinate) .... Take one-half  tablet by mouth daily 2)  Pradaxa 150 Mg Caps (Dabigatran etexilate mesylate) .... As directed 3)  Diovan 40 Mg Tabs (Valsartan) .Marland Kitchen.. 1 by mouth two times a day 4)  Tikosyn 500 Mcg Caps (Dofetilide) .... Take one tablet q12 hours 5)  The Very Finest  Fish Oil Liqd (Omega-3 fatty acids) .... Take 2 to 3 grams daily 6)  Nitroglycerin 0.4 Mg Subl (Nitroglycerin) .... One tablet under tongue every 5 minutes as needed for chest pain---may repeat times three  Other Orders: Consultation Level IV (61224)  Patient Instructions: 1)  Follow up in 6 to 8 weeks

## 2010-04-22 NOTE — Progress Notes (Signed)
Summary:  Lab Question  Phone Note Outgoing Call   Call placed by: Barnett Abu, RN, BSN,  May 13, 2009 1:59 PM Call placed to: Patient Summary of Call: Returned pt's call, he had a question about labs. Left message with Arbie Cookey to call me back Barnett Abu, RN, BSN  May 13, 2009 1:59 PM   Follow-up for Phone Call        Wanted to know how often Tikosyn labs are done, advised every 6 months.  Follow-up by: Barnett Abu, RN, BSN,  May 15, 2009 4:36 PM

## 2010-04-22 NOTE — Progress Notes (Signed)
  Phone Note Call from Patient   Summary of Call: SPOKE WITH PT COMPLAINT OUT OF RHYTHM SINCE THURS IS CURRENTLY TAKING TIKOSYN  HAS BEEN IN NORM RHYTHM FOR 4 MONTHS NORMALLY  WHEN GOES OUT OF RHYTHM RETURNS ON OWN WITHIN 10 HOURS OR LESS  HAS BEEN COMING UP ON 48 HOURS  PER PT HR 80-90 PLEASE ADVISE. C/O FATIGUE NO SOB NOTED. Initial call taken by: Devra Dopp, LPN,  November 02, 1290 10:17 AM  Follow-up for Phone Call        PER DR [place] PHONE RECOMMENDS NO CHANGES AT THIS TIME AN TO FORWARD TO DR [place].PT AWARE OF ABOVE AND WILL SPEAK WITH DR Caryl Comes BEGINNING OF NEXT WEEK. Follow-up by: Devra Dopp, LPN,  November 01, 9088 11:04 AM  Additional Follow-up for Phone Call Additional follow up Details #1::        plz scehdule DCCV next week and will need ECG  and bmet and mag in office prior to thst TxS  (thanks steve) Additional Follow-up by: Nikki Dom, MD, [facility],  November 01, 2009 4:23 PM     Appended Document:  SPOKE WITH PT CONVERTED BACK TO NORM RHYTHM AND THEN WENT BACK OUT OVER WEEKEND AT THIS TIME PT DECLINES  ON HAVING CARDIOVERSION PER PT IN PAST HAS NOT LASTED AND HAS READ IF DOESNT CHANCES ARE IT WONT WILL CALL BACK IF NEEDED.

## 2010-04-22 NOTE — Progress Notes (Signed)
Summary: MED REFILL  Phone Note Refill Request Message from:  Pharmacy on July 18, 2009 3:46 PM  Refills Requested: Medication #1:  TIKOSYN 500 MCG CAPS TAKE ONE TABLET Q12 HOURS PLEASE SEND TO [facility] [id number]  Initial call taken by: Lorraine Lax,  July 18, 2009 3:46 PM  Follow-up for Phone Call        Rx already sent into [facility].   Follow-up by: Doug Sou CMA,  July 19, 2009 10:16 AM

## 2010-04-22 NOTE — Progress Notes (Signed)
Summary: refill  Phone Note Refill Request Call back at [phone] Message from:  Patient on Aug 15, 2009 11:37 AM  Refills Requested: Medication #1:  TIKOSYN 500 [place] TABLET Q12 HOURS   Supply Requested: 3 months ref #[id number]   Method Requested: Fax to [facility] Initial call taken by: Darnell Level,  Aug 15, 2009 11:38 AM Caller: Caleb [name]  Follow-up for Phone Call       Follow-up by: Doug Sou CMA,  Aug 15, 2009 5:00 PM    Prescriptions: Caleb [name] 500 MCG CAPS (DOFETILIDE) TAKE ONE TABLET Q12 HOURS  #180 x 0   Entered by:   Doug Sou CMA   Authorized by:   Nikki Dom, MD, [facility]   Signed by:   Doug Sou CMA on 08/15/2009   Method used:   Electronically to        [facility] (mail-order)       [address]       [place]  [address]       Ph: [phone]       Fax: [phone]   RxID:   [id number]

## 2010-04-22 NOTE — Medication Information (Signed)
Summary: CCR  Anticoagulant Therapy  Managed by: Bonnita Nasuti, PharmD, BCPS, CPP Referring MD: Eustace Quail MD PCP: Stevie Kern, MD Supervising MD: Burt Knack MD, Legrand Como Indication 1: Atrial Fibrillation (ICD-427.31) Lab Used: [place] Site: [facility] INR POC 2.0 INR RANGE 2 - 2.5  Dietary changes: no    Health status changes: no    Bleeding/hemorrhagic complications: no    Recent/future hospitalizations: no    Any changes in medication regimen? no    Recent/future dental: no  Any missed doses?: no       Is patient compliant with meds? yes       Current Medications (verified): 1)  Metoprolol Succinate 25 Mg Xr24h-Tab (Metoprolol Succinate) .... Take One Tablet By Mouth Daily 2)  Warfarin Sodium 4 Mg Tabs (Warfarin Sodium) .... Take As Directed 3)  Diovan 40 Mg Tabs (Valsartan) .Marland Kitchen.. 1 By Mouth Two Times A Day 4)  Adult Aspirin Ec Low Strength 81 Mg Tbec (Aspirin) .... Take 1 Tablet Once Daily 5)  Tikosyn 500 Mcg Caps (Dofetilide) .... Take One Tablet Q12 Hours 6)  The Very Finest Fish Oil  Liqd (Omega-3 Fatty Acids) .... Take 2 To 3 Grams Daily 7)  Nitroglycerin 0.4 Mg Subl (Nitroglycerin) .... One Tablet Under Tongue Every 5 Minutes As Needed For Chest Pain---May Repeat Times Three 8)  Anusol 1-12.5 % Oint (Pramoxine-Zinc Oxide in Mo) .... Uad  Allergies: 1)  ! Sulfa  Anticoagulation Management History:      The patient is taking warfarin and comes in today for a routine follow up visit.  Positive risk factors for bleeding include an age of 24 years or older.  The bleeding index is 'intermediate risk'.  Positive CHADS2 values include History of HTN.  Negative CHADS2 values include Age > 64 years old.  The start date was 02/07/2007.  His last INR was 2.4.  Anticoagulation responsible provider: Burt Knack MD, Legrand Como.  INR POC: 2.0.  Cuvette Lot#: [id number].  Exp: 04/2010.    Anticoagulation Management Assessment/Plan:      The patient's current anticoagulation dose is  Warfarin sodium 4 mg tabs: Take as directed.  The target INR is 2.0-2.5.  The next INR is due 05/14/2009.  Anticoagulation instructions were given to patient.  Results were reviewed/authorized by Bonnita Nasuti, PharmD, BCPS, CPP.         Prior Anticoagulation Instructions: The patient is to continue with the same dose of coumadin.  This dosage includes: 1.5 tabs all days except tuesday and satruday take 2 tabs  Current Anticoagulation Instructions: INR 2.0  Coumadin 1 and 1/2 tab = 7m each day  except 2 tabs  = 868mon Wed and Sat

## 2010-04-22 NOTE — Assessment & Plan Note (Signed)
Summary: follow up/mt   Visit Type:  Follow-up Referring Hudsen Fei:  Dr. Virl Axe Primary Elza Varricchio:  Stevie Kern, MD  CC:  no complaints.  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 He is on Tikosyn and this has been associated with a significant reduction in the frequency of his atrial fibrillation episodes. He continues to consider the possibility of catheter ablation.  His episodes are nocturnal. They're not associated with any particular increase in his volume of alcohol. He underwent sleep study which demonstrated mild sleep apnea. He has seen Dr. Halford Chessman was recommended further therapies. Review of this note suggested the consideration was for oral appliance the decision of which was pending  There is no chest pain ; he has noted an improved shortness of breath since maintaining sinus rhythm.   his last echo was reviewed demonstrating::Overall left ventricular systolic function was moderately       decreased. Left ventricular ejection fraction was estimated ,       range being 35 % to 40 %. There was akinesis of the entire       anteroseptal wall. There was akinesis of the entire       periapical wall.   last potassium magnesium and the record or from the fall and they were normal.    Current Medications (verified): 1)  Metoprolol Succinate 25 Mg Xr24h-Tab (Metoprolol Succinate) .... Take One-Half  Tablet By Mouth Daily 2)  Pradaxa 150 Mg Caps (Dabigatran Etexilate Mesylate) .... As Directed 3)  Diovan 40 Mg Tabs (Valsartan) .Marland Kitchen.. 1 By Mouth Two Times A Day 4)  Tikosyn 500 Mcg Caps (Dofetilide) .... Take One Tablet Q12 Hours 5)  The Very Finest Fish Oil  Liqd (Omega-3 Fatty Acids) .... Take 2 To 3 Grams Daily 6)  Nitroglycerin 0.4 Mg Subl (Nitroglycerin) .... One Tablet Under Tongue Every 5 Minutes As Needed For Chest Pain---May Repeat Times Three  Allergies (verified): 1)  ! Sulfa  Past History:  Past Medical  History: Last updated: 11/15/2009 CORONARY HEART DISEASE (ICD-414.00) ATRIAL FIBRILLATION (ICD-427.31) CARDIOMYOPATHY, ISCHEMIC (NFA-213.0) SYSTOLIC HEART FAILURE, ACUTE ON CHRONIC (ICD-428.23) HYPERTENSION (ICD-401.9) HYPERLIPIDEMIA (ICD-272.4) PLEURAL EFFUSION (ICD-511.9) POSITIONAL VERTIGO (ICD-386.11) DIZZINESS (ICD-780.4) GERD (ICD-530.81) PNEUMONIA, ATYPICAL (ICD-486) COUGH (ICD-786.2) ALLERGIC RHINITIS (ICD-477.9) TIAs  OSA      - home sleep test 07/05/09 AHI 8.2  Past Surgical History: Last updated: 07/13/2008 Cor stent LAD 2004 Shoulder   Family History: Last updated: 03/07/2008 adopted  so no known family hx  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  Alcohol Use - yes socially Regular Exercise - yes Drug Use - no  Risk Factors: Alcohol Use: 1 (11/15/2009) Exercise: yes (07/13/2008)  Risk Factors: Smoking Status: never (11/15/2009)  Vital Signs:  Patient profile:   70 year old male Height:      69 inches Weight:      168 pounds BMI:     24.90 Pulse rate:   65 / minute BP sitting:   158 / 68  (left arm) Cuff size:   regular  Vitals Entered By: Mignon Pine, RMA (December 20, 2009 3:41 PM)  Physical Exam  General:  The patient was alert and oriented in no acute distress. HEENT Normal.  Neck veins were flat, carotids were brisk.  Lungs were clear.  Heart sounds were regular without murmurs or gallops.  Abdomen was soft with active bowel sounds. There is no clubbing cyanosis or edema. Skin Warm and dry  EKG  Procedure date:  12/20/2009  Findings:      QT interval today was 446 ms with a QTC of 0.47 PR interval was 206  Impression & Recommendations:  Problem # 1:  ATRIAL FIBRILLATION (ICD-427.31) pt continues on tikosyn for atrial fibrillation we will check tikosyn labs today. We spent more than 40 minutes discussing his options related to treatments for atrial fibrillation and the  discussions he has had with EPs around [place] about possible next steps>  He has a tentatvie ablation scheduled with T[facility] at [facility]   His updated medication list for this problem includes:    Metoprolol Succinate 25 Mg Xr24h-tab (Metoprolol succinate) .Marland Kitchen... Take one-half  tablet by mouth daily    Tikosyn 500 Mcg Caps (Dofetilide) .Marland Kitchen... Take one tablet q12 hours  Orders: T-Magnesium ([id number]) T-Basic Metabolic Panel ([id number]) T- Hemoglobin A1C ([id number])  Patient Instructions: 1)  Your physician recommends that you continue on your current medications as directed. Please refer to the Current Medication list given to you today. 2)  Your physician wants you to follow-up in: 6 months  You will receive a reminder letter in the mail two months in advance. If you don't receive a letter, please call our office to schedule the follow-up appointment. Prescriptions: DIOVAN 40 MG TABS (VALSARTAN) 1 by mouth two times a day  #60 Each x 11   Entered by:   Joan Flores RN   Authorized by:   Nikki Dom, MD, [facility]   Signed by:   Joan Flores RN on 12/20/2009   Method used:   Electronically to        [facility] (retail)       [place], [place]  [id number]       Ph: [phone]       Fax: [phone]   RxID:   [id number]

## 2010-04-22 NOTE — Progress Notes (Signed)
Summary: Question regarding QT interval  Phone Note Call from Patient Call back at Home Phone [phone]   Caller: Patient Reason for Call: Talk to Nurse Summary of Call: per pt calling, pt was seen on friday by sk.  has question regarding qt interval.  Initial call taken by: Neil Crouch,  December 23, 2009 10:57 AM  Follow-up for Phone Call        lmfcb Joan Flores, RN, BSN pt adv of labs and qt discussed. he will either come in 1 month for now for another ekg or have an MD in [place] recheck. Pt ot advise. Also he would like to discuss the long QT w/ [name] Follow-up by: Joan Flores RN,  December 23, 2009 2:18 PM

## 2010-04-22 NOTE — Medication Information (Signed)
Summary: [facility]  Anticoagulant Therapy  Managed by: Inactive Referring MD: Eustace Quail MD PCP: Stevie Kern, MD Supervising MD: Caryl Comes MD, Remo Lipps Indication 1: Atrial Fibrillation (ICD-427.31) Lab Used: [facility] Site: [facility] INR RANGE 2 - 2.5          Comments: on Pradaxa   Allergies: 1)  ! Sulfa  Anticoagulation Management History:      Positive risk factors for bleeding include an age of 72 years or older.  The bleeding index is 'intermediate risk'.  Positive CHADS2 values include History of HTN.  Negative CHADS2 values include Age > 70 years old.  The start date was 02/07/2007.  His last INR was 2.4.  Anticoagulation responsible provider: Caryl Comes MD, Remo Lipps.  Exp: 07/2010.    Anticoagulation Management Assessment/Plan:      The patient's current anticoagulation dose is Warfarin sodium 4 mg tabs: Take as directed.  The target INR is 2.0-2.5.  The next INR is due 08/02/2009.  Anticoagulation instructions were given to patient.  Results were reviewed/authorized by Inactive.         Prior Anticoagulation Instructions: INR 2.0  Continue same dose of 1 1/2 tablets every day except 2 tablets on Wednesday and Saturday

## 2010-04-22 NOTE — Progress Notes (Signed)
Summary: REFILL/QUESTIONS ABOUT LABS  Phone Note Refill Request Call back at Work Phone [phone] Message from:  Patient on May 09, 2009 11:37 AM  Refills Requested: Medication #1:  METOPROLOL SUCCINATE 25 MG XR24H-TAB Take one tablet by mouth daily  Medication #2:  WARFARIN SODIUM 4 MG TABS Take as directed  Medication #3:  DIOVAN 40 MG TABS 1 by mouth two times a day SEND TO [place] [phone] AND PT HAVE QUESTIONS ABOUT GETTING LABS.  Initial call taken by: Delsa Sale,  May 09, 2009 11:39 AM    Prescriptions: METOPROLOL SUCCINATE 25 MG XR24H-TAB (METOPROLOL SUCCINATE) Take one tablet by mouth daily  #90 x 3   Entered by:   Doug Sou CMA   Authorized by:   Nikki Dom, MD, [facility]   Signed by:   Doug Sou CMA on 05/10/2009   Method used:   Electronically to        [facility] (retail)       [place], [place]  [id number]       Ph: [phone]       Fax: [phone]   RxID:   [id number]

## 2010-04-22 NOTE — Assessment & Plan Note (Signed)
Summary: ROV   Referring Provider:  Stevie Kern, MD Primary Provider:  Stevie Kern, MD  CC:  ROV/.  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 He is on Tikosyn and this has been associated with a significant reduction in the frequency of his atrial fibrillation episodes. He continues to consider the possibility of catheter ablation.  His episodes are nocturnal. They're not associated with any particular increase in his volume of alcohol.  There is no chest pain ; he has noted an improved shortness of breath since maintaining sinus rhythm.   his last echo was reviewed demonstrating::Overall left ventricular systolic function was moderately       decreased. Left ventricular ejection fraction was estimated ,       range being 35 % to 40 %. There was akinesis of the entire       anteroseptal wall. There was akinesis of the entire       periapical wall.   last potassium magnesium and the record or from the fall and they were normal.    Current Medications (verified): 1)  Metoprolol Succinate 25 Mg Xr24h-Tab (Metoprolol Succinate) .... Take One Tablet By Mouth Daily 2)  Warfarin Sodium 4 Mg Tabs (Warfarin Sodium) .... Take As Directed 3)  Diovan 40 Mg Tabs (Valsartan) .Marland Kitchen.. 1 By Mouth Two Times A Day 4)  Tikosyn 500 Mcg Caps (Dofetilide) .... Take One Tablet Q12 Hours 5)  The Very Finest Fish Oil  Liqd (Omega-3 Fatty Acids) .... Take 2 To 3 Grams Daily 6)  Nitroglycerin 0.4 Mg Subl (Nitroglycerin) .... One Tablet Under Tongue Every 5 Minutes As Needed For Chest Pain---May Repeat Times Three  Allergies (verified): 1)  ! Sulfa  Past History:  Past Medical History: Last updated: 01/15/2009 CORONARY HEART DISEASE (ICD-414.00) ATRIAL FIBRILLATION (ICD-427.31) CARDIOMYOPATHY, ISCHEMIC (ZOX-096.0) SYSTOLIC HEART FAILURE, ACUTE ON CHRONIC (ICD-428.23) HYPERTENSION (ICD-401.9) HYPERLIPIDEMIA (ICD-272.4) PLEURAL EFFUSION  (ICD-511.9) POSITIONAL VERTIGO (ICD-386.11) DIZZINESS (ICD-780.4) GERD (ICD-530.81) PNEUMONIA, ATYPICAL (ICD-486) COUGH (ICD-786.2) ALLERGIC RHINITIS (ICD-477.9) TIAs   Past Surgical History: Last updated: 07/13/2008 Cor stent LAD 2004 Shoulder   Family History: Last updated: 03/07/2008 adopted  so no known family hx  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  Alcohol Use - yes socially Regular Exercise - yes Drug Use - no  Vital Signs:  Patient profile:   70 year old male Height:      69 inches Weight:      171 pounds BMI:     25.34 Pulse rate:   65 / minute Pulse rhythm:   regular BP sitting:   132 / 64  (left arm) Cuff size:   large  Vitals Entered By: Doug Sou CMA (June 25, 2009 9:27 AM)  Physical Exam  General:  The patient was alert and oriented in no acute distress. HEENT Normal.  Neck veins were flat, carotids were brisk.  Lungs were clear.  Heart sounds were regular without murmurs or gallops.  Abdomen was soft with active bowel sounds. There is no clubbing cyanosis or edema. Skin Warm and dry    EKG  Procedure date:  06/25/2009  Findings:      sinus rhythm at 65 Intervals 0.20/one 0/1435 axis is 22 Nonspecific ST  Impression & Recommendations:  Problem # 1:  ATRIAL FIBRILLATION (ICD-427.31) the patient has had scant recurrences of his atrial fibrillation. He is maintaining Tikosyn. Last laboratories were obtained in the fall they will be repeated  today. His QTC is normal.  The patient also has nocturnal atrial fibrillation a potential trigger of sleep apnea. We'll evaluate this with an eye watch monitor.  He is scheduled to see Dr. Tally Due at  [facility] later this month for consideration of ablation therapy.  Will plan to leave him on his Coumadin until then. We did discuss potential value of Pradaxa as an alternative therapy and discussed the side effects including no reversibility as well  as GI symptoms. He will consider this.  Also because of his bradycardia, we'll decrease his Toprol further. This may further improve his nocturnal propensity toward atrial fibrillation Orders: TLB-BMP (Basic Metabolic Panel-BMET) (52778-EUMPNTI) TLB-Magnesium (Mg) (83735-MG) Echocardiogram (Echo) Event (Event) TLB-CRP-High Sensitivity (C-Reactive Protein) (86140-FCRP)  Problem # 2:  DYSPNEA ON EXERTION (ICD-786.09) his shortness of breath with exertion has not really improved following maintenance of sinus rhythm. We'll plan to repeat his ultrasound of a lesion of his heart. He may well need repeat Myoview scanning with his prior history of coronary disease The following medications were removed from the medication list:    Adult Aspirin Ec Low Strength 81 Mg Tbec (Aspirin) .Marland Kitchen... Take 1 tablet once daily His updated medication list for this problem includes:    Metoprolol Succinate 25 Mg Xr24h-tab (Metoprolol succinate) .Marland Kitchen... Take one-half  tablet by mouth daily    Diovan 40 Mg Tabs (Valsartan) .Marland Kitchen... 1 by mouth two times a day  The following medications were removed from the medication list:    Adult Aspirin Ec Low Strength 81 Mg Tbec (Aspirin) .Marland Kitchen... Take 1 tablet once daily His updated medication list for this problem includes:    Metoprolol Succinate 25 Mg Xr24h-tab (Metoprolol succinate) .Marland Kitchen... Take one-half  tablet by mouth daily    Diovan 40 Mg Tabs (Valsartan) .Marland Kitchen... 1 by mouth two times a day  Problem # 3:  CORONARY HEART DISEASE (ICD-414.00) no nintercurrent chest pain The following medications were removed from the medication list:    Adult Aspirin Ec Low Strength 81 Mg Tbec (Aspirin) .Marland Kitchen... Take 1 tablet once daily His updated medication list for this problem includes:    Metoprolol Succinate 25 Mg Xr24h-tab (Metoprolol succinate) .Marland Kitchen... Take one-half  tablet by mouth daily    Warfarin Sodium 4 Mg Tabs (Warfarin sodium) .Marland Kitchen... Take as directed    Nitroglycerin 0.4 Mg Subl  (Nitroglycerin) ..... One tablet under tongue every 5 minutes as needed for chest pain---may repeat times three  Orders: EKG w/ Interpretation (93000) TLB-CRP-High Sensitivity (C-Reactive Protein) (86140-FCRP) TLB-A1C / Hgb A1C (Glycohemoglobin) (83036-A1C)  The following medications were removed from the medication list:    Adult Aspirin Ec Low Strength 81 Mg Tbec (Aspirin) .Marland Kitchen... Take 1 tablet once daily His updated medication list for this problem includes:    Metoprolol Succinate 25 Mg Xr24h-tab (Metoprolol succinate) .Marland Kitchen... Take one-half  tablet by mouth daily    Warfarin Sodium 4 Mg Tabs (Warfarin sodium) .Marland Kitchen... Take as directed    Nitroglycerin 0.4 Mg Subl (Nitroglycerin) ..... One tablet under tongue every 5 minutes as needed for chest pain---may repeat times three  Patient Instructions: 1)  Your physician recommends that you return for lab work today. 2)  Your physician has recommended you make the following change in your medication: Decrease your Metoprolol to 82m 1/2 tablet daily. 3)  Your physician has recommended that you have a sleep study.  This test records several body functions during sleep, including:  brain activity, eye movement, oxygen and carbon dioxide blood levels,  heart rate and rhythm, breathing rate and rhythm, the flow of air through your mouth and nose, snoring, body muscle movements, and chest and belly movement. 4)  Your physician has requested that you have an echocardiogram.  Echocardiography is a painless test that uses sound waves to create images of your heart. It provides your doctor with information about the size and shape of your heart and how well your heart's chambers and valves are working.  This procedure takes approximately one hour. There are no restrictions for this procedure.

## 2010-04-22 NOTE — Progress Notes (Signed)
Summary: Off Pradaxa for Procedure   Phone Note Call from Patient Call back at Home Phone [phone]   Caller: Patient Summary of Call: Pt is having prostate biopsy next week.  Plan was to bridge his Coumadin with Lovenox.  He was just changed from Coumadin to Pradaxa by another MD last week.  Pt wants to know how long to hold Pradaxa and if he needs to bridge with Lovenox.  Will discuss with [name] and inform pt.  Initial call taken by: Porfirio Oar PharmD,  July 18, 2009 5:39 PM  Follow-up for Phone Call        Spoke with [name].  He suggest just holding Pradaxa for 2 days prior to procedure then resuming afterwards.  Spoke with pt.  He is aware.  He will hold his Pradaxa for 3-4 doses prior to the procedure then begin again once okay with MD performing procedure.  Follow-up by: Porfirio Oar PharmD,  July 19, 2009 4:31 PM

## 2010-04-22 NOTE — Miscellaneous (Signed)
Summary: Orders Update  Clinical Lists Changes  Orders: Added new Test order of T-CRP (C-Reactive Protein) (84784) - Signed

## 2010-04-22 NOTE — Medication Information (Signed)
Summary: rov/eac  Anticoagulant Therapy  Managed by: Margaretha Sheffield, PharmD Referring MD: Eustace Quail MD PCP: Stevie Kern, MD Supervising MD: Angelena Form MD, Harrell Gave Indication 1: Atrial Fibrillation (ICD-427.31) Lab Used: [facility] Site: [facility] INR POC 2.0 INR RANGE 2 - 2.5  Dietary changes: no    Health status changes: no    Bleeding/hemorrhagic complications: yes       Details: seeing urologist for episode of hematuria  Recent/future hospitalizations: no    Any changes in medication regimen? no    Recent/future dental: no  Any missed doses?: no       Is patient compliant with meds? yes       Allergies: 1)  ! Sulfa  Anticoagulation Management History:      The patient is taking warfarin and comes in today for a routine follow up visit.  Positive risk factors for bleeding include an age of 72 years or older.  The bleeding index is 'intermediate risk'.  Positive CHADS2 values include History of HTN.  Negative CHADS2 values include Age > 34 years old.  The start date was 02/07/2007.  His last INR was 2.4.  Anticoagulation responsible provider: Angelena Form MD, [place] Gave.  INR POC: 2.0.  Cuvette Lot#: [id number].  Exp: 07/2010.    Anticoagulation Management Assessment/Plan:      The patient's current anticoagulation dose is Warfarin sodium 4 mg tabs: Take as directed.  The target INR is 2.0-2.5.  The next INR is due 07/05/2009.  Anticoagulation instructions were given to patient.  Results were reviewed/authorized by Margaretha Sheffield, PharmD.  He was notified by Margaretha Sheffield, PharmD.         Prior Anticoagulation Instructions: INR 1.9  Take 2 tablets today.  Then return to normal dose of 2 tablet of Wednesday and Saturday and take 1.5 tablets all other days. Return to clinic in 4 weeks.   Current Anticoagulation Instructions: INR 2.0  Continue taking 2 tablets on Wednesday and Saturday and 1.5 tablets all other days.  REturn to clinic in 4 weeks.

## 2010-04-22 NOTE — Progress Notes (Signed)
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  Also discussed w/pt his sleep study that was done in April he states he did talk w/Dr Caryl Comes regarding those results but wasn't sure if f/u was needed or not, advised Dr Caryl Comes had advised he f/u w/pulm, he is agreeable will place order, also Dr Caryl Comes had a mentioned a myoview per DR Rolland Porter, pt states he does not need that at this time and wouldn't need until around Nov Kevan Rosebush, RN  October 28, 2009 3:44 PM   New Problems: SLEEP APNEA (ICD-780.57)   New Problems: SLEEP APNEA (ICD-780.57)

## 2010-04-22 NOTE — Progress Notes (Signed)
Summary: *lmtcb/ Lab Results  Phone Note From Other Clinic   Call placed by: Howell Pringle, RN, BSN,  June 26, 2009 11:08 AM Call placed to: Patient Reason for Call: Discuss lab or test results Summary of Call: RN Left Message To Call Back. Howell Pringle, RN, BSN  June 26, 2009 11:08 AM   Follow-up for Phone Call        Spoke with pt. Lab results given. Pt. verbalized understanding.   Pt returning call Delsa Sale  June 26, 2009 3:41 PM Follow-up by: Carollee Sires, RN, BSN,  June 26, 2009 5:13 PM

## 2010-04-22 NOTE — Medication Information (Signed)
Summary: blood in urine/lg  Anticoagulant Therapy  Managed by: Margaretha Sheffield, PharmD Referring MD: Eustace Quail MD PCP: Stevie Kern, MD Supervising MD: Aundra Dubin MD, Lysha Schrade Indication 1: Atrial Fibrillation (ICD-427.31) Lab Used: [facility] Site: [facility] INR POC 1.9 INR RANGE 2 - 2.5  Dietary changes: no    Health status changes: no    Bleeding/hemorrhagic complications: no    Recent/future hospitalizations: no    Any changes in medication regimen? no    Recent/future dental: no  Any missed doses?: no       Is patient compliant with meds? yes       Allergies: 1)  ! Sulfa  Anticoagulation Management History:      The patient is taking warfarin and comes in today for a routine follow up visit.  Positive risk factors for bleeding include an age of 72 years or older.  The bleeding index is 'intermediate risk'.  Positive CHADS2 values include History of HTN.  Negative CHADS2 values include Age > 70 years old.  The start date was 72/17/2008.  His last INR was 2.4.  Anticoagulation responsible provider: Aundra Dubin MD, Caryl Fate.  INR POC: 1.9.  Cuvette Lot#: [id number].  Exp: 06/2010.    Anticoagulation Management Assessment/Plan:      The patient's current anticoagulation dose is Warfarin sodium 4 mg tabs: Take as directed.  The target INR is 2.0-2.5.  The next INR is due 06/07/2009.  Anticoagulation instructions were given to patient.  Results were reviewed/authorized by Margaretha Sheffield, PharmD.  He was notified by Margaretha Sheffield.         Prior Anticoagulation Instructions: INR 2.0  Coumadin 1 and 1/2 tab = 51m each day  except 2 tabs  = 887mon Wed and Sat  Current Anticoagulation Instructions: INR 1.9  Take 2 tablets today.  Then return to normal dose of 2 tablet of Wednesday and Saturday and take 1.5 tablets all other days. Return to clinic in 4 weeks.

## 2010-04-22 NOTE — Progress Notes (Signed)
Summary: Calling regarding sleep study test  Phone Note Call from Patient Call back at [phone]   Caller: Patient Summary of Call: Pt calling regarding sleep study test Initial call taken by: Delsa Sale,  July 15, 2009 2:15 PM  Follow-up for Phone Call        Dr Caryl Comes called patient and left message on machine. Chanetta Marshall RN BSN  July 19, 2009 7:10 AM  Dr.Clavijo returning call pt is back in town call pt back [phone] ofr the next hour Delsa Sale  July 19, 2009 9:32 AM  Additional Follow-up for Phone Call Additional follow up Details #1::       Additional Follow-up by: Nikki Dom, MD, [facility],  July 19, 2009 6:29 PM    Additional Follow-up for Phone Call Additional follow up Details #2::    swpoke with patient about sleep study with [facility] will arrange visit with pulmonary  and per Dr Marlowe Aschoff request, will need myoview Follow-up by: Nikki Dom, MD, [facility],  July 19, 2009 6:37 PM

## 2010-04-24 NOTE — Progress Notes (Signed)
Summary: rx  Phone Note Call from Patient   Caller: Patient Call For: Caleb Cookey MD Summary of Call: Pt is having (yellow productive cough this am), and was wondering if there is any antibiotic other than oral.???? [phone] Initial call taken by: [facility] CMA AAMA,  March 13, 2010 10:30 AM  Follow-up for Phone Call        [facility] please call............Marland Kitchen just given some doxycycline, 100 mg b.i.d. x 10 days no refill Follow-up by: Caleb Cookey MD,  March 13, 2010 1:39 PM  Additional Follow-up for Phone Call Additional follow up Details #1::        patient states he does not want the ATB at this time but will call tomorrow if not better.  [place]. Additional Follow-up by: Westley Hummer CMA Deborra Medina),  March 13, 2010 1:48 PM    Additional Follow-up for Phone Call Additional follow up Details #2::    Pt would like to have the Doxycycline called to [facility].   Would like Phenergan with Codeine called to [facility] as he and Dr. Hildred Laser discussed it.  Please call him back, [name].  He really wants to talk to you.  Follow-up by: Deanna Artis CMA AAMA,  March 14, 2010 9:27 AM  Additional Follow-up for Phone Call Additional follow up Details #3:: Details for Additional Follow-up Action Taken: ok Additional Follow-up by: Caleb Cookey MD,  March 14, 2010 9:47 AM  New/Updated Medications: DOXYCYCLINE HYCLATE 100 MG CAPS (DOXYCYCLINE HYCLATE) take one tab two times a day for 10 days Prescriptions: DOXYCYCLINE HYCLATE 100 MG CAPS (DOXYCYCLINE HYCLATE) take one tab two times a day for 10 days  #20 x 0   Entered by:   Westley Hummer CMA ([place])   Authorized by:   Caleb Cookey MD   Signed by:   Westley Hummer CMA ([place]) on 03/14/2010   Method used:   Electronically to        [facility] (retail)       [address] [place], [place]  [id number]       Ph: [phone]       Fax: [phone]   RxID:   [id number]

## 2010-04-24 NOTE — Assessment & Plan Note (Signed)
Summary: EKG  Nurse Visit   Vital Signs:  Patient profile:   70 year old male Pulse rate:   67 / minute Pulse rhythm:   regular BP sitting:   127 / 61  (left arm)  Referring Provider:  Dr. Virl Axe Primary Provider:  Stevie Kern, MD   History of Present Illness: Pt c/o of being in AFIB for several days, states that while waiting in lobby he coverted to NSR. EKG confirms 1st degree w/occasional PVC.  Pt denies cp, shob or other complaints. Will have labs to check mag and K+. He has been taking additional mag the last several days and adv him on 1/9 to stop taking additional mag.    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 Take One-Half  Tablet By Mouth Daily 2)  Pradaxa 150 Mg Caps (Dabigatran Etexilate Mesylate) .... As Directed 3)  Diovan 40 Mg Tabs (Valsartan) .Marland Kitchen.. 1 By Mouth Two Times A Day 4)  Tikosyn 500 Mcg Caps (Dofetilide) .... Take One Tablet Q12 Hours 5)  The Very Finest Fish Oil  Liqd (Omega-3 Fatty Acids) .... Take 2 To 3 Grams Daily 6)  Nitroglycerin 0.4 Mg Subl (Nitroglycerin) .... One Tablet Under Tongue Every 5 Minutes As Needed For Chest  Pain---May Repeat Times Three 7)  Hydromet 5-1.5 Mg/22m Syrp (Hydrocodone-Homatropine) .... 1/2 To 1 Tsp Three Times A Day As Needed 8)  Doxycycline Hyclate 100 Mg Caps (Doxycycline Hyclate) .... Take One Tab Two Times A Day For 10 Days 9)  Promethazine-Codeine 6.25-10 Mg/593mSyrp (Promethazine-Codeine) .... Half To One  Teaspoons Three Times A Day As Needed Cough 10)  Prednisone 10 Mg  Tabs (Prednisone) .... Take As Directed Take 4 Daily For Two Days, Then 3 Daily For Two Days, Then Two Daily For Two Days Then One Daily For Two Days Then Stop 11)  Qvar 40 Mcg/act  Aers (Beclomethasone Dipropionate) .... Two Puffs Twice Daily  Current Medications (verified): 1)  Metoprolol Succinate 25 Mg Xr24h-Tab (Metoprolol Succinate) .... Take One-Half  Tablet By Mouth Daily 2)  Pradaxa 150 Mg Caps (Dabigatran Etexilate Mesylate) .... As Directed 3)  Diovan 40 Mg Tabs (Valsartan) ...Marland Kitchen 1 By Mouth Two Times A Day 4)  Tikosyn 500 Mcg Caps (Dofetilide) .... Take One Tablet Q12 Hours 5)  The Very Finest Fish Oil  Liqd (Omega-3 Fatty Acids) .... Take 2 To 3 Grams Daily 6)  Nitroglycerin 0.4 Mg Subl (Nitroglycerin) .... One Tablet Under Tongue Every 5 Minutes As Needed For Chest Pain---May Repeat Times Three 7)  Hydromet 5-1.5 Mg/59m23myrp (Hydrocodone-Homatropine) .... 1/2 To 1 Tsp Three Times A Day As Needed 8)  Doxycycline Hyclate 100 Mg Caps (Doxycycline Hyclate) .... Take One Tab Two Times A  Day For 10 Days 9)  Promethazine-Codeine 6.25-10 Mg/60m Syrp (Promethazine-Codeine) .... Half To One  Teaspoons Three Times A Day As Needed Cough 10)  Prednisone 10 Mg  Tabs (Prednisone) .... Take As Directed Take 4 Daily For Two Days, Then 3 Daily For Two Days, Then Two Daily For Two Days Then One Daily For Two Days Then Stop 11)  Qvar 40 Mcg/act  Aers (Beclomethasone Dipropionate) .... Two Puffs Twice Daily  Allergies (verified): 1)  ! Sulfa

## 2010-04-24 NOTE — Progress Notes (Signed)
Summary: rx directions  Phone Note Call from Patient   Summary of Call: Directions for phenergan & codiene cough syurp please? Initial call taken by: Westley Hummer CMA Deborra Medina),  March 14, 2010 10:36 AM  Follow-up for Phone Call        one half to 1 teaspoon 3 times a day p.r.n. Follow-up by: Dorena Cookey MD,  March 14, 2010 11:12 AM    New/Updated Medications: PROMETHAZINE-CODEINE 6.25-10 MG/5ML SYRP (PROMETHAZINE-CODEINE) half to one  teaspoons three times a day as needed cough

## 2010-04-24 NOTE — Assessment & Plan Note (Signed)
Summary: Pulmonary OV   Visit Type:  Acute Copy to:  Dr. Virl Axe Primary Provider/Referring Provider:  Stevie Kern, MD  CC:  Pt c/o productive cough with light yellow mucus x 1 month, rales starting last night. Denies fever, n/v/d, chills, and sweats.  History of Present Illness: 70 yo male for sleep evaluation.  He is followed by cardiology for his coronary artery disease, systolic dysfunction, and atrial fibrillation.  He is being evaluated for ablation of his arrhythmia.  During his recent cardiac follow up concern was raised as to whether he could have sleep disordered breathing as a contributing risk factor.  As a result he had a home sleep test on April 15 and 16.  This showed an AHI of 7.4 and 8.2.  This prompted further sleep evaluation.  He goes to bed at 1030pm, and falls asleep quickly.  He does not use anything to help him sleep.  He is up several times to use the bathroom.  He attributes this to age and prostate problems.  He gets out of bed at 7am.  He will sometimes feel tired when he gets up, but denies morning headache.  He does not nap, but will get sleepy in the late afternoon.  He can fall asleep easily if he is reading a magazine.  He is not using anything to help him stay awake.  He does snore.  He tends to sleep on his side.  He will get a dry mouth.  He will occasional talk in his sleep.  He tends to remember his dreams when he has them.    He denies sleep walking, or bruxism.  There is no history of restless legs. He denies sleep hallucinations, sleep paralysis, or cataplexy.  He does not drink alcohol to help sleep.  His weight has been steady.  There is no history of thyroid disease.  He has not noticed any problem with his mood.March 31, 2010 11:04 AM THis is the 4th week of viral type illness. The symptoms have been : terrible cold, sore throat,  pn drip.  The pt now  notes he is still coughing but not as much,  The pt saw PCP and rx cough meds and doxy.  The  pt started 5days ago Rx  and went 1/2 way through this and has some doxy left.  no cxr yet obtained Pt notes recurrent afib with increased palpitations. No edema. No f/c/s.  No n/v/change in stool. The mucus is difficult to raise.    Preventive Screening-Counseling & Management  Alcohol-Tobacco     Alcohol drinks/day: 1     Alcohol type: wine     Smoking Status: never  Current Medications (verified): 1)  Metoprolol Succinate 25 Mg Xr24h-Tab (Metoprolol Succinate) .... Take One-Half  Tablet By Mouth Daily 2)  Pradaxa 150 Mg Caps (Dabigatran Etexilate Mesylate) .... As Directed 3)  Diovan 40 Mg Tabs (Valsartan) .Marland Kitchen.. 1 By Mouth Two Times A Day 4)  Tikosyn 500 Mcg Caps (Dofetilide) .... Take One Tablet Q12 Hours 5)  The Very Finest Fish Oil  Liqd (Omega-3 Fatty Acids) .... Take 2 To 3 Grams Daily 6)  Nitroglycerin 0.4 Mg Subl (Nitroglycerin) .... One Tablet Under Tongue Every 5 Minutes As Needed For Chest Pain---May Repeat Times Three 7)  Hydromet 5-1.5 Mg/48m Syrp (Hydrocodone-Homatropine) .... 1/2 To 1 Tsp Three Times A Day As Needed 8)  Doxycycline Hyclate 100 Mg Caps (Doxycycline Hyclate) .... Take One Tab Two Times A Day For 10 Days  9)  Promethazine-Codeine 6.25-10 Mg/9m Syrp (Promethazine-Codeine) .... Half To One  Teaspoons Three Times A Day As Needed Cough  Allergies (verified): 1)  ! Sulfa  Past History:  Past medical, surgical, family and social histories (including risk factors) reviewed, and no changes noted (except as noted below).  Past Medical History: Reviewed history from 11/15/2009 and no changes required. CORONARY HEART DISEASE (ICD-414.00) ATRIAL FIBRILLATION (ICD-427.31) CARDIOMYOPATHY, ISCHEMIC (IHKV-425.9 SYSTOLIC HEART FAILURE, ACUTE ON CHRONIC (ICD-428.23) HYPERTENSION (ICD-401.9) HYPERLIPIDEMIA (ICD-272.4) PLEURAL EFFUSION (ICD-511.9) POSITIONAL VERTIGO (ICD-386.11) DIZZINESS (ICD-780.4) GERD (ICD-530.81) PNEUMONIA, ATYPICAL (ICD-486) COUGH  (ICD-786.2) ALLERGIC RHINITIS (ICD-477.9) TIAs  OSA      - home sleep test 07/05/09 AHI 8.2  Past Surgical History: Reviewed history from 07/13/2008 and no changes required. Cor stent LAD 2004 Shoulder   Family History: Reviewed history from 03/07/2008 and no changes required. 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  Alcohol Use - yes socially Regular Exercise - yes Drug Use - no  Review of Systems       The patient complains of shortness of breath with activity, shortness of breath at rest, productive cough, and non-productive cough.  The patient denies coughing up blood, chest pain, irregular heartbeats, acid heartburn, indigestion, loss of appetite, weight change, abdominal pain, difficulty swallowing, sore throat, tooth/dental problems, headaches, nasal congestion/difficulty breathing through nose, sneezing, itching, ear ache, anxiety, depression, hand/feet swelling, joint stiffness or pain, rash, change in color of mucus, and fever.    Vital Signs:  Patient profile:   70year old male Height:      69 inches Weight:      169 pounds BMI:     25.05 O2 Sat:      98 % on Room air Temp:     97.5 degrees F oral Pulse rate:   120 / minute BP sitting:   150 / 82  (left arm) Cuff size:   regular  Vitals Entered By: JIran PlanasCMA (March 31, 2010 10:29 AM)  O2 Flow:  Room air CC: Pt c/o productive cough with light yellow mucus x 1 month, rales starting last night. Denies fever, n/v/d, chills, sweats Comments Medications reviewed with patient Verified contact number and pharmacy with patient JIran PlanasCMA  March 31, 2010 10:30 AM    Physical Exam  Additional Exam:  Gen: Pleasant, well-nourished, in no distress,  normal affect ENT: No lesions,  mouth clear,  oropharynx clear, no postnasal drip Neck: No JVD, no TMG, no carotid bruits Lungs: No use of  accessory muscles, no dullness to percussion, exp wheezes, poor airflow Cardiovascular:irreg, irreg, heart sounds normal, no murmur or gallops, no peripheral edema Abdomen: soft and NT, no HSM,  BS normal Musculoskeletal: No deformities, no cyanosis or clubbing Neuro: alert, non focal Skin: Warm, no lesions or rashes    CXR  Procedure date:  03/31/2010  Findings:      IMPRESSION: No acute findings.  Impression & Recommendations:  Problem # 1:  ACUTE BRONCHITIS (ICD-466.0) Assessment Deteriorated acute tracheobronchitis with flare bronchiolitis on exam CXR shows no active disease, no CHF plan pulse prednisone finish doxy  start qvar 448m two puff two times a day x two cannisters (one rx and one sample) His updated medication list for this problem includes:    Hydromet 5-1.5 Mg/13m13myrp (Hydrocodone-homatropine) ....Marland Kitchen 1/2 to 1 tsp three times a day as needed  Doxycycline Hyclate 100 Mg Caps (Doxycycline hyclate) .Marland Kitchen... Take one tab two times a day for 10 days    Promethazine-codeine 6.25-10 Mg/62m Syrp (Promethazine-codeine) ..... Half to one  teaspoons three times a day as needed cough    Qvar 40 Mcg/act Aers (Beclomethasone dipropionate) ..Marland Kitchen..Marland KitchenTwo puffs twice daily  Orders: Est. Patient Level IV ((57846  Problem # 2:  ATRIAL FIBRILLATION (ICD-427.31) Assessment: Deteriorated atrial fibrillation, rapid ventricular response plan f/u with cardiology His updated medication list for this problem includes:    Metoprolol Succinate 25 Mg Xr24h-tab (Metoprolol succinate) ..Marland Kitchen.. Take one-half  tablet by mouth daily    Tikosyn 500 Mcg Caps (Dofetilide) ..Marland Kitchen.. Take one tablet q12 hours  Medications Added to Medication List This Visit: 1)  Prednisone 10 Mg Tabs (Prednisone) .... Take as directed take 4 daily for two days, then 3 daily for two days, then two daily for two days then one daily for two days then stop 2)  Qvar 40 Mcg/act Aers (Beclomethasone dipropionate) .... Two puffs  twice daily  Complete Medication List: 1)  Metoprolol Succinate 25 Mg Xr24h-tab (Metoprolol succinate) .... Take one-half  tablet by mouth daily 2)  Pradaxa 150 Mg Caps (Dabigatran etexilate mesylate) .... As directed 3)  Diovan 40 Mg Tabs (Valsartan) ..Marland Kitchen. 1 by mouth two times a day 4)  Tikosyn 500 Mcg Caps (Dofetilide) .... Take one tablet q12 hours 5)  The Very Finest Fish Oil Liqd (Omega-3 fatty acids) .... Take 2 to 3 grams daily 6)  Nitroglycerin 0.4 Mg Subl (Nitroglycerin) .... One tablet under tongue every 5 minutes as needed for chest pain---may repeat times three 7)  Hydromet 5-1.5 Mg/5690mSyrp (Hydrocodone-homatropine) .... 1/2 to 1 tsp three times a day as needed 8)  Doxycycline Hyclate 100 Mg Caps (Doxycycline hyclate) .... Take one tab two times a day for 10 days 9)  Promethazine-codeine 6.25-10 Mg/90m17myrp (Promethazine-codeine) .... Half to one  teaspoons three times a day as needed cough 10)  Prednisone 10 Mg Tabs (Prednisone) .... Take as directed take 4 daily for two days, then 3 daily for two days, then two daily for two days then one daily for two days then stop 11)  Qvar 40 Mcg/act Aers (Beclomethasone dipropionate) .... Two puffs twice daily  Other Orders: T-2 View CXR ([id number] Instructions: 1)  Finish doxycycline 2)  Take prednisone 74m68mke 4 daily for two days, then 3 daily for two days, then two daily for two days then one daily for two days then stop 3)  Qvar 40 two puff twice daily, use sample and one refill 4)  Mucinex DM two twice daily for 10days 5)  Return 6 weeks if unimproved Prescriptions: QVAR 40 MCG/ACT  AERS (BECLOMETHASONE DIPROPIONATE) Two puffs twice daily  #1 x 1   Entered and Authorized by:   PatrElsie Stain  Signed by:   PatrElsie Stainon 03/31/2010   Method used:   Electronically to        [facility]       [address]  [id number]   Ph: [phone]   Fax:  [phone]:   :   [id number] 10 MG  TABS (PREDNISONE) Take as directed Take 4 daily for two days, then 3 daily for two days, then two daily for two days then one daily for two days then stop  #  20 x 0   Entered and Authorized by:   Elsie Stain MD   Signed by:   Elsie Stain MD on 03/31/2010   Method used:   Electronically to        [facility] ([facility]ail)       [place], [place]  [id number]       Ph: [phone]       Fax: [phone]   RxID:   [id number]    Immunization History:  Influenza Immunization History:    Influenza:  historical (01/27/2010)

## 2010-04-24 NOTE — Progress Notes (Signed)
Summary: wants ov today--where to work in?  Phone Note Call from Patient Call back at Home Phone [phone]   Caller: Spouse---carol segal---triage vm Reason for Call: Talk to Nurse Complaint: Cough/Sore throat Summary of Call: has an enormous sore throat, with coughing, not down in his lungs. He has atrial Fib and he "popped out" last night. needs ov. has not slept. please return call Initial call taken by: Despina Arias,  March 11, 2010 8:19 AM  Follow-up for Phone Call        [name] having come over now Follow-up by: Dorena Cookey MD,  March 11, 2010 8:47 AM  Additional Follow-up for Phone Call Additional follow up Details #1::        Phone Call Completed Additional Follow-up by: Westley Hummer CMA Deborra Medina),  March 11, 2010 9:02 AM

## 2010-04-24 NOTE — Progress Notes (Signed)
  Phone Note Call from Patient   Caller: Patient Call For: Dorena Cookey MD Summary of Call: Pt is still having sore throat symptoms, and has decided to take the Doxycycline. No fever, chest congestion, etc. Initial call taken by: Deanna Artis CMA AAMA,  March 26, 2010 9:42 AM  Follow-up for Phone Call        Provider Notified Follow-up by: Dorena Cookey MD,  March 27, 2010 7:51 AM

## 2010-04-24 NOTE — Assessment & Plan Note (Signed)
Summary: st, cough - rv   Vital Signs:  Patient profile:   70 year old male Weight:      174 pounds Temp:     98.5 degrees F oral BP sitting:   120 / 78  (left arm) Cuff size:   regular  Vitals Entered By: Westley Hummer CMA Deborra Medina) (March 11, 2010 9:59 AM) CC: sore throat, cough   Primary Care Provider:  Stevie Kern, MD  CC:  sore throat and cough.  History of Present Illness: Caleb [name] is a 70 year old, married man nonsmoker, who comes in today accompanied by his wife with a 5-day history of a congestion, sore throat, postnasal drip, and nonproductive cough.  Review of systems negative.  Rapid strep negative  Allergies: 1)  ! Sulfa  Past History:  Past medical, surgical, family and social histories (including risk factors) reviewed for relevance to current acute and chronic problems.  Past Medical History: Reviewed history from 11/15/2009 and no changes required. CORONARY HEART DISEASE (ICD-414.00) ATRIAL FIBRILLATION (ICD-427.31) CARDIOMYOPATHY, ISCHEMIC (NFA-213.0) SYSTOLIC HEART FAILURE, ACUTE ON CHRONIC (ICD-428.23) HYPERTENSION (ICD-401.9) HYPERLIPIDEMIA (ICD-272.4) PLEURAL EFFUSION (ICD-511.9) POSITIONAL VERTIGO (ICD-386.11) DIZZINESS (ICD-780.4) GERD (ICD-530.81) PNEUMONIA, ATYPICAL (ICD-486) COUGH (ICD-786.2) ALLERGIC RHINITIS (ICD-477.9) TIAs  OSA      - home sleep test 07/05/09 AHI 8.2  Past Surgical History: Reviewed history from 07/13/2008 and no changes required. Cor stent LAD 2004 Shoulder   Family History: Reviewed history from 03/07/2008 and no changes required. 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  Alcohol Use - yes socially Regular Exercise - yes Drug Use - no  Review of Systems      See HPI  Physical Exam  General:  Well-developed,well-nourished,in no acute distress; alert,appropriate and cooperative throughout  examination Head:  Normocephalic and atraumatic without obvious abnormalities. No apparent alopecia or balding. Eyes:  No corneal or conjunctival inflammation noted. EOMI. Perrla. Funduscopic exam benign, without hemorrhages, exudates or papilledema. Vision grossly normal. Ears:  External ear exam shows no significant lesions or deformities.  Otoscopic examination reveals clear canals, tympanic membranes are intact bilaterally without bulging, retraction, inflammation or discharge. Hearing is grossly normal bilaterally. Nose:  External nasal examination shows no deformity or inflammation. Nasal mucosa are pink and moist without lesions or exudates. Mouth:  Oral mucosa and oropharynx without lesions or exudates.  Teeth in good repair. Neck:  No deformities, masses, or tenderness noted. Chest Wall:  No deformities, masses, tenderness or gynecomastia noted. Lungs:  Normal respiratory effort, chest expands symmetrically. Lungs are clear to auscultation, no crackles or wheezes.   Impression & Recommendations:  Problem # 1:  SORE THROAT (ICD-462) Assessment New  Orders: Rapid Strep (86578)  Complete Medication List: 1)  Metoprolol Succinate 25 Mg Xr24h-tab (Metoprolol succinate) .... Take one-half  tablet by mouth daily 2)  Pradaxa 150 Mg Caps (Dabigatran etexilate mesylate) .... As directed 3)  Diovan 40 Mg Tabs (Valsartan) .Marland Kitchen.. 1 by mouth two times a day 4)  Tikosyn 500 Mcg Caps (Dofetilide) .... Take one tablet q12 hours 5)  The Very Finest Fish Oil Liqd (Omega-3 fatty acids) .... Take 2 to 3 grams daily 6)  Nitroglycerin 0.4 Mg Subl (Nitroglycerin) .... One tablet under tongue every 5 minutes as needed for chest pain---may repeat times three 7)  Hydromet 5-1.5 Mg/39m Syrp (Hydrocodone-homatropine) .... 1/2 to 1 tsp three times a day as needed  Patient Instructions: 1)  rest at  home. 2)  Drink 30 ounces of water daily. 3)  Vaporizer in your bedroom. 4)  Hydromet one half to 1 teaspoon 3  to 4 times a day as needed for cough and cold.  Return p.r.n. Prescriptions: HYDROMET 5-1.5 MG/5ML SYRP (HYDROCODONE-HOMATROPINE) 1/2 to 1 tsp three times a day as needed  #8oz x 1   Entered and Authorized by:   Dorena Cookey MD   Signed by:   Dorena Cookey MD on 03/11/2010   Method used:   Print then Give to Patient   RxID:   [id number]    Orders Added: 1)  Rapid Strep [02542] 2)  Est. Patient Level III [70623]    Laboratory Results  Date/Time Received: March 11, 2010   Other Tests  Rapid Strep: negative Comments: Westley Hummer CMA Deborra Medina)  March 11, 2010 10:26 AM

## 2010-04-24 NOTE — Progress Notes (Signed)
Summary: pt having a-fib  Phone Note Call from Patient   Caller: Patient [phone] Reason for Call: Talk to Nurse Summary of Call: pt calling re a-fib, pcp told him to take abx, he's not sure about this due to taking tikosyn, pls advise Initial call taken by: Lorenda Hatchet,  March 14, 2010 8:15 AM  Follow-up for Phone Call        Pt is aware Doxycycline is compatible with Tikosyn. Also asked about Codeine-- ok.  Also requested appt with [name] or PA and obtain EKG for QT review.  Scheduled appt with PA on 04/08/10 same day as [name] in office. Pt aware and agreeable. Pt reports he is in atrial fib but "probably due to incessant coughing from a cold" otherwise he is having no cardiac issues at this time. Horton Chin RN

## 2010-04-24 NOTE — Assessment & Plan Note (Signed)
Summary: rov/dfg   Visit Type:  rov Referring Provider:  Dr. Virl Axe Primary Provider:  Stevie Kern, MD  CC:  pt denies any cardiac complaints today.  History of Present Illness: Primary Electrophysiologist:  Dr. Virl Axe  Caleb [name] is a 70 yo male with a h/o parox. AFib, on Tikosyn and coumadin, CAD, s/p Ant MI in 2004 tx'd with stenting to the LAD, ICM, last echo in 06/2009 with EF 30-35%, ant HK, septal and apical AK, mild LVH, mild AI/MR, mild LAE, chronic systolic CHF, HTN and hyperlipidemia.  He has called in recently with a sensation he is back in AFib.  HR was in 120s recently when he was seen by Dr. Joya Gaskins in follow up for URI symptoms.  He was dx'd with acute bronchitis.  He was put on Doxy and prednisone about a week ago.  He returns for follow up.  Labs: K 4.6, Creat 0.7, Mg 2.1 (04/01/10)  He is doing much better.  His cough is reduced.  He denies any chest discomfort or shortness of breath.  He denies exertional arm or jaw discomfort.  He denies syncope.  He denies any further palpitations.  He denies orthopnea, PND or pedal edema.  Current Medications (verified): 1)  Metoprolol Succinate 25 Mg Xr24h-Tab (Metoprolol Succinate) .... Take One-Half  Tablet By Mouth Daily 2)  Pradaxa 150 Mg Caps (Dabigatran Etexilate Mesylate) .Marland Kitchen.. 1 Cap Two Times A Day 3)  Diovan 40 Mg Tabs (Valsartan) .Marland Kitchen.. 1 By Mouth Two Times A Day 4)  Tikosyn 500 Mcg Caps (Dofetilide) .... Take One Tablet Q12 Hours 5)  The Very Finest Fish Oil  Liqd (Omega-3 Fatty Acids) .... Take 2 To 3 Grams Daily 6)  Nitroglycerin 0.4 Mg Subl (Nitroglycerin) .... One Tablet Under Tongue Every 5 Minutes As Needed For Chest Pain---May Repeat Times Three  Allergies: 1)  ! Sulfa  Past History:  Past Medical History: Last updated: 11/15/2009 CORONARY HEART DISEASE (ICD-414.00) ATRIAL FIBRILLATION (ICD-427.31) CARDIOMYOPATHY, ISCHEMIC (IZX-281.1) SYSTOLIC HEART FAILURE, ACUTE ON CHRONIC  (ICD-428.23) HYPERTENSION (ICD-401.9) HYPERLIPIDEMIA (ICD-272.4) PLEURAL EFFUSION (ICD-511.9) POSITIONAL VERTIGO (ICD-386.11) DIZZINESS (ICD-780.4) GERD (ICD-530.81) PNEUMONIA, ATYPICAL (ICD-486) COUGH (ICD-786.2) ALLERGIC RHINITIS (ICD-477.9) TIAs  OSA      - home sleep test 07/05/09 AHI 8.2  Review of Systems       As per  the HPI.  All other systems reviewed and negative.   Vital Signs:  Patient profile:   70 year old male Height:      69 inches Weight:      168.50 pounds BMI:     24.97 Pulse rate:   65 / minute Pulse rhythm:   irregular BP sitting:   150 / 70  (left arm) Cuff size:   regular  Vitals Entered By: Julaine Hua, CMA (April 08, 2010 10:13 AM)  Physical Exam  General:  Well nourished, well developed, in no acute distress HEENT: normal Neck: no JVD Cardiac:  normal S1, S2; RRR; no murmur Lungs:  clear to auscultation bilaterally, no wheezing, rhonchi or rales Abd: soft, nontender, no hepatomegaly Ext: no edema Skin: warm and dry Neuro:  CNs 2-12 intact, no focal abnormalities noted    EKG  Procedure date:  04/08/2010  Findings:      normal sinus rhythm Heart rate 65 Normal axis PACs Approximately one millimeter of ST depression in leads 2, 3 and some scooping noted of the ST segment in lead aVF Poor R-wave progression His ST segment changes in the appearance of digoxin  changes but he is not on digoxin  Impression & Recommendations:  Problem # 1:  ATRIAL FIBRILLATION (ICD-427.31) Maintaining sinus rhythm.  His potassium and magnesium are adequate recently.  His QTC today is 432 ms.  He will continue on Tikosyn.  Orders: EKG w/ Interpretation (93000)  Problem # 2:  CARDIOMYOPATHY, ISCHEMIC (ICD-414.8) He is optivolemic.  Problem # 3:  CORONARY HEART DISEASE (ICD-414.00) He is currently not having angina.  However he has an abnormal EKG.  It has been over 6 years since his last ischemic evaluation.  I discussed this with [name].  We  will set him up for [place] study.  This will be scheduled once he is further recovered from his bronchitis.  He will followup with [name] thereafter.  Orders: Nuclear Stress Test (Nuc Stress Test)  Problem # 4:  ACUTE BRONCHITIS (ICD-466.0) Improved.  f/u with pulmonary and PCP.  Problem # 5:  HYPERTENSION (ICD-401.9) BP elevated.  May be related to recent illness.  Monitor for now.  Problem # 6:  HYPERLIPIDEMIA (HKG-677.4) He refuses to take statin therapy due to concerns over adverse reactions.  Patient Instructions: 1)  Your physician recommends that you schedule a follow-up appointment in: [place] 2)  Your physician recommends that you continue on your current medications as directed. Please refer to the Current Medication list given to you today. 3)  Your physician has requested that you have an LeXISCAN stress myoview.  For further information please visit [email or web address].  Please follow instruction sheet, as given.

## 2010-04-24 NOTE — Progress Notes (Signed)
  03/31/10--1630pm--pt calling stating he has been very sick with upper resp infection and back in a fib due to constant cough--he would like to know how long he can stay on tikosyn( i believe he's trying to convert on this med) before he has to stop it?--also is any testing necessary since he's been taking extra magnesium and vit c and cold meds?--advised i would let dr Caryl Comes and his nurse know and one of them would call him back, but this may not happen until tomorrow--pt agrees--nt  Appended Document:  spoke w/pt and he states he has been out of rhythm since late sat early sunday. concerned about additional mag that he has been taking. adv to stop taking that. he will come in tomorrow for ekg and bmet and mag testing. he is taking tikosyn as directed. Joan Flores, RN, BSN

## 2010-04-25 ENCOUNTER — Telehealth: Payer: Self-pay | Admitting: Internal Medicine

## 2010-04-30 NOTE — Progress Notes (Signed)
Summary: discuss stress test  Phone Note Call from Patient Call back at Home Phone [phone]   Caller: Patient Reason for Call: Talk to Nurse Summary of Call: per pt calling back to speak with christine. - aware christine if off today. discuss stress test.  Initial call taken by: Neil Crouch,  April 25, 2010 12:11 PM  Follow-up for Phone Call        Pt is ready to schedule lexiscan as discussed with Richardson Dopp on 04/08/10.  Follow-up by: Joelyn Oms RN,  April 25, 2010 12:32 PM  New Problems: ABNORMAL ELECTROCARDIOGRAM (ICD-794.31)   New Problems: ABNORMAL ELECTROCARDIOGRAM (ICD-794.31)

## 2010-05-12 ENCOUNTER — Telehealth: Payer: Self-pay | Admitting: Internal Medicine

## 2010-05-12 ENCOUNTER — Telehealth (INDEPENDENT_AMBULATORY_CARE_PROVIDER_SITE_OTHER): Payer: Self-pay | Admitting: *Deleted

## 2010-05-13 ENCOUNTER — Telehealth (INDEPENDENT_AMBULATORY_CARE_PROVIDER_SITE_OTHER): Payer: Self-pay | Admitting: *Deleted

## 2010-05-14 ENCOUNTER — Encounter: Payer: Self-pay | Admitting: Cardiovascular Disease

## 2010-05-14 ENCOUNTER — Ambulatory Visit (HOSPITAL_COMMUNITY): Payer: Medicare Other | Attending: Internal Medicine

## 2010-05-14 DIAGNOSIS — I4949 Other premature depolarization: Secondary | ICD-10-CM

## 2010-05-14 DIAGNOSIS — R079 Chest pain, unspecified: Secondary | ICD-10-CM

## 2010-05-14 DIAGNOSIS — R9431 Abnormal electrocardiogram [ECG] [EKG]: Secondary | ICD-10-CM

## 2010-05-14 DIAGNOSIS — R0602 Shortness of breath: Secondary | ICD-10-CM

## 2010-05-14 DIAGNOSIS — I2510 Atherosclerotic heart disease of native coronary artery without angina pectoris: Secondary | ICD-10-CM

## 2010-05-16 ENCOUNTER — Ambulatory Visit: Payer: Self-pay | Admitting: Critical Care Medicine

## 2010-05-20 NOTE — Progress Notes (Signed)
Summary: Nuclear pre procedure  Phone Note Call from Patient   Caller: Patient Call For: Nuclear Medicine Summary of Call: Patient called in for instructions.  Reviewed information on Myoview Information Sheet (see scanned document for further details).       Nuclear Med Background Indications for Stress Test: Evaluation for Ischemia, Stent Patency, Abnormal EKG   History: Echo, Heart Catheterization, Myocardial Infarction, Stents  History Comments: '04 AWMI>Stent-LAD; '05 MPS:old anterior infarct w/some ischemia at the apex, EF=50%>Cath:patent stent, n/o CAD-medical tx; 4/11 Echo:EF=30-35%, mild MR, AR; h/o PAF, ICM     Nuclear Pre-Procedure Cardiac Risk Factors: Hypertension, Lipids, TIA Height (in): 28  Nuclear Med Study Referring MD:  Eustace Quail MD

## 2010-05-20 NOTE — Progress Notes (Signed)
Summary: c/o afib now,.   Phone Note Call from Patient Call back at Home Phone [phone] P [place]: Patient Reason for Call: Talk to Nurse Complaint: Nausea/Vomiting/Diarrhea Summary of Call: pt has procedure on wed .c/o afib now. would they do the procedure if he not in normal sinus rhythm.  Initial call taken by: Neil Crouch,  May 12, 2010 4:39 PM  Follow-up for Phone Call        Phone Call Completed WILL DISCUSS WITH NUC MED  TOM PER PT HR IS OVER 100   IS WONDERING IF TEST WOULD BE BENEFICIAL  WITH RATE SO HIGH  WILL TRY AND TAKE BETA BLOCKER AND TIKOSYN  AT DIFFERENT TIMES THIS PM AND DRINK PLENTY OF FLUIDS . Follow-up by: Devra Dopp, LPN,  May 12, 6765 5:41 PM  Additional Follow-up for Phone Call Additional follow up Details #1::        agree Additional Follow-up by: Nikki Dom, MD, [facility],  May 12, 2010 6:07 PM     Appended Document: c/o afib now,.  PT MAY PROCEED WITH MYOVIEW PER DR Caryl Comes.PT AWARE STATES IS BACK NORMAL SINUS RYTHM AND FEELS BETTER THIS AM.

## 2010-05-20 NOTE — Progress Notes (Signed)
Summary: Nuclear Pre-Procedure  Phone Note Outgoing Call Call back at Home Phone [phone] P [facility]     Call placed by: Eliezer Lofts, EMT-P,  May 13, 2010 1:54 PM Call placed to: Patient Action Taken: Phone Call Completed Summary of Call: Reviewed information on Myoview Information Sheet (see scanned document for further details).  Spoke with the patient. Eliezer Lofts, EMT-P  May 13, 2010 1:55 PM      Nuclear Med Background Indications for Stress Test: Evaluation for Ischemia, Stent Patency, Abnormal EKG   History: Echo, Heart Catheterization, Myocardial Infarction, Stents  History Comments: '04 AWMI>Stent-LAD; '05 MPS:old anterior infarct w/some ischemia at the apex, EF=50%>Cath:patent stent, n/o CAD-medical tx; 4/11 Echo:EF=30-35%, mild MR, AR; h/o PAF, ICM     Nuclear Pre-Procedure Cardiac Risk Factors: Hypertension, Lipids, TIA Height (in): 76  Nuclear Med Study Referring MD:  Eustace Quail MD
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
## 2010-06-16 ENCOUNTER — Other Ambulatory Visit: Payer: Self-pay | Admitting: Internal Medicine

## 2010-06-19 NOTE — Telephone Encounter (Signed)
[address] °

## 2010-07-01 LAB — BASIC METABOLIC PANEL
BUN: 10 mg/dL (ref 6–23)
BUN: 15 mg/dL (ref 6–23)
CO2: 28 mEq/L (ref 19–32)
Calcium: 9.5 mg/dL (ref 8.4–10.5)
Calcium: 9.8 mg/dL (ref 8.4–10.5)
Calcium: 9.2 mg/dL (ref 8.4–10.5)
Calcium: 9.7 mg/dL (ref 8.4–10.5)
Chloride: 107 mEq/L (ref 96–112)
Creatinine, Ser: 0.85 mg/dL (ref 0.4–1.5)
Creatinine, Ser: 0.89 mg/dL (ref 0.4–1.5)
GFR calc Af Amer: >60 mL/min (ref >60)
GFR calc Af Amer: >60 mL/min (ref >60)
GFR calc Af Amer: >60 mL/min (ref >60)
GFR calc non Af Amer: >60 mL/min (ref >60)
GFR calc non Af Amer: >60 mL/min (ref >60)
GFR calc non Af Amer: >60 mL/min (ref >60)
Potassium: 4.4 mEq/L (ref 3.5–5.1)
Potassium: 4.9 mEq/L (ref 3.5–5.1)
Sodium: 140 mEq/L (ref 135–145)
Sodium: 140 mEq/L (ref 135–145)

## 2010-07-01 LAB — PROTIME-INR
INR: 2 — ABNORMAL HIGH (ref 0.00–1.49)
INR: 2.3 — ABNORMAL HIGH (ref 0.00–1.49)
INR: 2.4 — ABNORMAL HIGH (ref 0.00–1.49)
INR: 2.5 — ABNORMAL HIGH (ref 0.00–1.49)
INR: 2.6 — ABNORMAL HIGH (ref 0.00–1.49)
Prothrombin Time: 23.6 seconds — ABNORMAL HIGH (ref 11.6–15.2)
Prothrombin Time: 27.8 seconds — ABNORMAL HIGH (ref 11.6–15.2)
Prothrombin Time: 28.5 seconds — ABNORMAL HIGH (ref 11.6–15.2)

## 2010-07-01 LAB — CBC
Hemoglobin: 15.2 g/dL (ref 13.0–17.0)
Hemoglobin: 15.1 g/dL (ref 13.0–17.0)
MCHC: 34.4 g/dL (ref 30.0–36.0)
MCV: 93.6 fL (ref 78.0–100.0)
Platelets: 189 10*3/uL (ref 150–400)
RBC: 5.11 MIL/uL (ref 4.22–5.81)
RBC: 4.71 MIL/uL (ref 4.22–5.81)
RDW: 14.5 % (ref 11.5–15.5)
WBC: 7.2 10*3/uL (ref 4.0–10.5)
WBC: 6.5 10*3/uL (ref 4.0–10.5)

## 2010-07-01 LAB — HEMOGLOBIN A1C: Mean Plasma Glucose: 123 mg/dL

## 2010-07-02 LAB — BASIC METABOLIC PANEL
BUN: 11 mg/dL (ref 6–23)
Chloride: 103 mEq/L (ref 96–112)
Glucose, Bld: 108 mg/dL — ABNORMAL HIGH (ref 70–99)
Potassium: 4.6 mEq/L (ref 3.5–5.1)
Sodium: 140 mEq/L (ref 135–145)

## 2010-07-02 LAB — PROTIME-INR: INR: 1.7 — ABNORMAL HIGH (ref 0.00–1.49)

## 2010-08-05 NOTE — Op Note (Signed)
NAMERAMELO, OETKEN               ACCOUNT NO.:  [id number]   MEDICAL RECORD NO.:  [id number]          PATIENT TYPE:  OIB   LOCATION:  2899                         FACILITY:  [facility]   PHYSICIAN:  Thompson Grayer, MD       DATE OF BIRTH:  09/08/1940   DATE OF PROCEDURE:  DATE OF DISCHARGE:  08/07/2008                               OPERATIVE REPORT   PREPROCEDURE DIAGNOSIS:  Persistent atrial fibrillation.   POSTPROCEDURE DIAGNOSIS:  Persistent atrial fibrillation.   PROCEDURES:  Elective electrical cardioversion.   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  He reports longstanding  symptomatic atrial fibrillation for which he was recently evaluated by  [name] and initiated on dofetilide.  He now presents for  cardioversion.   DESCRIPTION OF THE PROCEDURE:  Informed, written consent was obtained  and the patient was brought to the electrophysiology lab in the fasting  state.  He was observed to be in atrial fibrillation upon presentation.  Adequate IV access and airway support were assured.  The patient was  then successfully sedated with intravenous Versed and Fentanyl as  outlined in the nursing report.  Cardioversion electrodes were placed in  the anterior-posterior thoracic configuration.  The patient was  cardioverted to sinus rhythm with a single synchronized 360 joules  shock.  He had several beats of sinus rhythm before returning to atrial  fibrillation.  A second 360 joules synchronized biphasic shock was again  delivered and the patient was again cardioverted to sinus rhythm.  He  remained in sinus rhythm thereafter.  There were no early apparent  complications.   CONCLUSIONS:  1. Persistent atrial fibrillation.  2. Successful cardioversion to sinus rhythm.  3. No early apparent complications.      Thompson Grayer, MD  Electronically Signed     JA/MEDQ  D:  08/07/2008  T:  08/08/2008  Job:   [id number]   cc:   Deboraha Sprang, MD, [facility]

## 2010-08-05 NOTE — Letter (Signed)
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  He  got started on digoxin and metoprolol in December because of persistent  symptoms of fatigue.  He had congestive failure manifested by edema and  chest x-ray  abnormalities.  He is much improved on the combination.  He  denies breathlessness or even significant exercise impairment.  He then  asked what he should do about Tikosyn, and he mentioned again that he  talked to you about ablation, (see below).   CURRENT MEDICATIONS:  1. Metoprolol 50.  2. Digoxin  3. Pravachol.  4. Diovan 20 b.i.d.   PHYSICAL EXAMINATION:  VITAL SIGNS:  His blood pressure was elevated  today at 154/92, pulse 87, he was fasting.  His weight was 157, which is  down almost 10 pounds in the last 6 months.  Neck veins were flat.  LUNGS:  Clear.  HEART:  His heart sounds were irregular without murmurs or gallops.  ABDOMEN:  Soft.  EXTREMITIES:  No edema.   .   IMPRESSION:  1. Atrial fibrillation - permanent with at this time fewer symptoms,      perhaps related to somewhat improved ventricular response.  2. Ongoing decision regarding anticoagulation_therapy.  3. Hypertension and age as risk factors for which he takes Coumadin.   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  I suggested to take Holter to assess the adequacy of heart  rate control.  As he is tolerating up titration of these drugs, might  continue to have a benefit related to his symptoms.   When he asked about Tikosyn, I was encouraged first that his QTC was now  within range so that we use it if we wanted.  I told him, however, that  I thought the potential roles of Tikosyn were 2, the first if he  wanted  to pursue catheter ablation  that trying to get him regular rhythm would  help remodeling and might improve the likelihood of benefit from  ablation procedure (S).  The second is that his overall symptoms might  be improved if he remains asymptomatic with his atrial fibrillation.  As  is relates to the latter, improvement in the last month, we will have to  wait and see how he would do further up titration of his drugs as it  relates to the former.  As it relates to the former, unless there were  new data at [place], I do not know that we know that there are hard end  point benefits to catheter ablation for atrial fibrillation.  The  studies that were ongoing were paroxysmal versus persistent atrial  fibrillation as opposed to permanent AF and I think it would be a little  bit hard to extrapolate whatever data we do end up with in a permanent  AF patient, but I still think that the value of Tikosyn in him is valid  if catheter ablation is the target.  The long and short of it  is I told  him that his symptoms need to dictate what it is that he wants to do  next.  I also told him that ongoing indecision as it  relates to  remodeling has potentially negative consequences of its own as it  relates to potential ablation outcome.   I gather his wife is to have an ablation with you in the next month.  I  am sure he will discuss these things with you at that time in a more  formal setting.   Please let me know if there is anything further I can do.  I will  forward a copy of the Holter results to you.    Sincerely,      Caleb Sprang, MD, [facility]  Electronically Signed    SCK/MedQ  DD: 04/19/2008  DT: 04/19/2008  Job #: [id number]   CC:   Caleb [name], M.D.  Caleb Alfonso Patten Olevia Perches, MD, [facility]

## 2010-08-05 NOTE — Assessment & Plan Note (Signed)
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  He remains tenuous about proceeding with Tikosyn therapy.  He has met  with Dr. Adrian Prows who recommended either Tikosyn or sotalol  therapy.   He had numerous questions today including drug side effects, drug  efficacy, drug mortality, drug use history.  We reviewed those to my  best ability.   I also alerted in fact that his QTc interval is about 460 milliseconds,  and it is ranged from the 420 to 460 range, both with the sinus rhythm  and atrial fibrillation.  This may be related to mild hypokalemia or  hypomagnesemia, although we do not have any evidence from either as of  March 12.  This may have some impact on these dosing for Tikosyn for  atrial fibrillation.   I also mention that his QT correction may be partly related to rate as  well as irregularity; however, there is at least one electrocardiogram  with sinus rhythm with a QTc of greater than 440 milliseconds.   He is to let us know as to what he wants to do and at that point, we  will need to check the potassium, magnesium, and repeat the  electrocardiogram.     Deboraha Sprang, MD, [facility]  Electronically Signed    SCK/MedQ  DD: 10/04/2007  DT: 10/05/2007  Job #: [id number]

## 2010-08-05 NOTE — Letter (Signed)
April 25, 2007    Caleb [name], [place], [place] [address] [place], [place] [address]   RE:  AMMON, MUSCATELLO  MRN:  [id number]  /  DOB:  Feb 10, 1941   Dear  Darnell Level:   It was a pleasure to see Caleb [name] at your request.  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  This was notable both by  palpitations and progressive exercise intolerance.  This occurred in the  setting of more remote myocardial infarction occurring in 2004 in the  anterior wall distribution, and he received a Taxus stent to his LAD at  that time.  Ejection fraction had been in the 40% range.   At the time of his atrial fibrillation, apparently you and I had  discussed treatment options including rate control versus rhythm  control.  He has had many questions, and so I am asked to see him.  Between his initial diagnosis and now he had an episode in mid November  where while making a bed he suddenly fell the floor.  On the arrival via  EMS, he was in sinus rhythm.  It was his interpretation that he had a  stroke.  He underwent MRI scanning which demonstrated multiple  abnormalities highly suggestive of probable microembolic phenomenon and  various distributions.  He is not on Coumadin as best as I can tell.   He had an echo done in Dr. Lockie Mola office around that same time which  demonstrated ejection fraction still in the 40% range.   Rate control has been pursued using Cardizem at 120.  He has been  intolerant to Coreg because of alopecia and higher doses of Cardizem  also been poorly tolerated.   PAST MEDICAL HISTORY:  Notable for:  1. Fatigue.  2. Depression.   PAST SURGICAL HISTORY:  Notable for shoulder surgery.   SOCIAL HISTORY:  He is married to his second wife.  He has two children.  He has five grandchildren that he shares with her wife.  He does not use  cigarettes or recreational drugs.  Does drink  alcohol occasionally.   PHYSICAL EXAMINATION:  GENERAL:  He is an older Caucasian male appearing  younger than his stated age of 19.  VITAL SIGNS:  His blood pressure 148/90, his pulse was 91 and irregular.  HEENT:  Exam demonstrated no icterus or xanthoma.  NECK:  Veins were 78 cm.  The carotids were brisk without bruits.  BACK:  Without kyphosis or scoliosis.  LUNGS:  Clear.  HEART:  Sounds were irregular with a displaced PMI.  There is no  significant murmurs.  ABDOMEN:  Soft with active bowel sounds.  ABDOMEN:  Without midline pulsation or hepatomegaly.  EXTREMITIES:  Femoral pulses were 2+.  Distal pulses were intact.  There  is no clubbing, cyanosis or edema.  NEUROLOGIC:  Exam was grossly normal.   STUDIES:  Electrocardiogram dated today demonstrated atrial fibrillation  rate of 103 with intervals - __________/0.08/4.35.   IMPRESSION:  1. Atrial fibrillation with a poorly controlled ventricular response.  2. Rate control strategy currently using Cardizem with intolerance of      higher doses and previously intolerant of Coreg because of      alopecia.  3. Ischemic heart disease.      a.     Status post myocardial infarction.      b.  Status post DES stenting.      c.     Ejection fraction of 40%.  4. Congestive heart failure - class II to III.  5. Multiple MRI abnormalities consistent with prior thromboembolism.  6. Loss of postural tone episode temporally associated with      restoration of sinus rhythm.   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  The first is  clearly he has need for Coumadin, and this would be ongoing unless he  would undergo Maze surgery with left atrial appendectomy.   His heart rate is currently in adequate control, as you know, and he has  not tolerated higher doses of Cardizem.  We will plan to begin him on  metoprolol initially at 5 mg and see how that works.  I would plan to,  especially given his blood pressure, further down  titrate his calcium  blockers up titrate his beta blocker as tolerated.  We reviewed side  effects of the beta blockers.   In addition, I think that given the fact that he has reverted to sinus  rhythm once on his own, that the utility of DC cardioversion in the  absence antiarrhythmic drugs is likely to be low, and I would suggest  that we undertake Tikosyn therapy.  We have discussed previously the  ability to go from one drug to the other.  It is easier that way than to  start with amiodarone.   I have discussed with him the potential for proarrhythmia and the need  for in-hospital initiation.   The transition to sinus rhythm associated with that fall sounds to be  more like a post termination pause than it does a stroke.  Because of  that, I would pursue cardioversion in a controlled situation like EP lab  with temporary transvenous pacing available.  Prior to initiation of  Tikosyn and/or amiodarone therapy.  I have reviewed this also with him.   I think the urgency with which we proceed however drives from what is  LVEF is, and I am concerned given his symptoms and his rapid rate at  rest today at 103 that a tachycardia cardiomyopathic process is likely  superimposed now upon his ischemic cardiomyopathy.  To assess this, we  will get an echo.  In the event that this is true, I think urgency will  dictate that we move sooner rather than later.   We also discussed the role of pulmonary vein isolation, and according to  guidelines, it follows failure of antiarrhythmic drug therapy, and I  think that that is appropriate.  I have reviewed this also with him.   We will wait to hear from him regarding his choice about Tikosyn.  He  asked that I call Dr. Jory Sims, which I will do.   Thank you much for consultation.    Sincerely,      Deboraha Sprang, MD, [facility]  Electronically Signed    SCK/MedQ  DD: 04/25/2007  DT: 04/25/2007  Job #: [id number]   CC:    Pat Patrick. Rayford Halsted,  M.D.

## 2010-08-05 NOTE — Cardiovascular Report (Signed)
[name], Caleb [name]               ACCOUNT NO.:  [id number]   MEDICAL RECORD NO.:  [id number]          PATIENT TYPE:  OIB   LOCATION:  2853                         FACILITY:  [facility]   PHYSICIAN:  Caleb R. 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  Dr. Dione Housekeeper performed  anesthesia and gave 175 mg of Pentothal IV.  We converted him at 150  joules, using AP pads on the first shock from atrial fibrillation to  sinus rhythm.  There were no immediate sequelae.   A successful DC cardioversion.      Caleb Alfonso Patten Olevia Perches, MD, [facility]  Electronically Signed     BRB/MEDQ  D:  06/06/2007  T:  06/06/2007  Job:  [id number]

## 2010-08-05 NOTE — Consult Note (Signed)
[name], Caleb [name]               ACCOUNT NO.:  [id number]   MEDICAL RECORD NO.:  [id number]          PATIENT TYPE:  AMB   LOCATION:  ENDO                         FACILITY:  [facility]   PHYSICIAN:  Denice Bors. Stanford Breed, MD, FACCDATE OF BIRTH:  1940/04/03   DATE OF CONSULTATION:  05/20/2007  DATE OF DISCHARGE:                                 CONSULTATION   [name] is a 70 year old male with atrial fibrillation.  He presented  today for a transesophageal echocardiogram guided cardioversion.  We  sedated the patient with a total of 75 mcg of fentanyl intravenously as  well as 7.5 mg of Versed.  I attempted to insert the T probe multiple  times unsuccessfully.  I then asked for  Dr. Cristina Gong of gastroenterology to help with passing the probe.  He  tried multiple times as well and was unsuccessful.  The procedure was  therefore cancelled.  I did discuss the patient with Dr. Olevia Perches who is  his primary cardiologist.  We will plan to proceed with cardioversion  once his INR has been documented to be therapeutic for three consecutive  weeks.  We will also ask Dr. Olevia Perches to arrange a barium swallow as an  outpatient.      Denice Bors Stanford Breed, MD, [facility]  Electronically Signed     BSC/MEDQ  D:  05/20/2007  T:  05/21/2007  Job:  [id number]

## 2010-08-05 NOTE — Discharge Summary (Signed)
NAMEARBOR, COHEN               ACCOUNT NO.:  [id number]   MEDICAL RECORD NO.:  [id number]          PATIENT TYPE:  INP   LOCATION:  2007                         FACILITY:  [facility]   PHYSICIAN:  Deboraha Sprang, MD, FACCDATE OF BIRTH:  1940-12-09   DATE OF ADMISSION:  07/30/2008  DATE OF DISCHARGE:  08/02/2008                               DISCHARGE SUMMARY   PRIMARY CAREGIVER:  Dellis Filbert A. Sherren Mocha, MD.   CARDIOLOGIST:  Vanna Scotland. Olevia Perches, MD, [facility]   This patient has allergy to SULFA.   TIME FOR THE DICTATION, EXAMINATION, AND EXPLANATION TO THE PATIENT:  Greater than 1 hour.   FINAL DIAGNOSES:  1. Admitted for Tikosyn load and monitoring for symptomatic atrial      fibrillation.  2. QTc 443 after dose #3 of Tikosyn.  3. Pharmacologic conversion from atrial fibrillation to sinus      bradycardia, Aug 01, 2008.  4. The patient notes immediately that his general well being has      improved when he was in sinus rhythm.   SECONDARY DIAGNOSES:  1. History of anterior wall myocardial infarction, November 2004 with      a stent placed to the LAD, a Taxus stent.  2. Ischemic cardiomyopathy, ejection fraction of 35%-40% by      echocardiogram, February 2009.  3. Left heart catheterization in 2005, ejection fraction 45%-50%.  No      in-stent restenosis in the LAD.  The mid LAD has 60% stenosis.  The      first diagonal was small with an ostial 99% stenosis.  The left      circumflex was free of significant coronary artery disease.  Right      coronary artery had 30% stenosis.  Medical therapy will be pursued.  4. Hypertension.  5. Dyslipidemia.  6. Chronic Coumadin therapy.  7. Gastroesophageal reflux disease.  8. Positional vertigo.  9. Possible history of transient ischemic attack.  54.[id number] for acute systolic congestive heart failure in 2004.  11.Direct current cardioversion x2 in the past.  12.Right shoulder surgery.  13.Transesophageal echocardiogram aborted secondary to  elevated      anxiety levels.   PROCEDURES ON THIS ADMISSION:  Tikosyn loading with constant monitoring  of the QT interval.  This did not increase during the dosing period,  which extended to a total of 7 doses of Tikosyn prior to discharge.  As  mentioned above, the patient did have a pharmacologic conversion on  hospital day #3 and the patient did not require direct current  cardioversion.   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  He has a history of  atrial fibrillation.  He has trouble with rate control.  He has trouble  with volume control and has had actual acute on chronic congestive heart  failure admissions.  In addition, the patient is very symptomatic in the  sense that he feels fatigued, he feels palpitations and skipping when in  atrial fibrillation.  The patient and [name] have agreed that he will  be admitted for Tikosyn therapy.  Of note, he did have  a prior  admission, which would have included a transesophageal echocardiogram  prior to initiating Tikosyn therapy.  This was aborted.  The patient has  now been therapeutic on his Coumadin for a 4-week period and comes in  for Tikosyn loading.   HOSPITAL COURSE:  The patient presents electively on May 10.  His INR  has been therapeutic.  His potassium is above 4, it is 4.2.  Magnesium  is within normal limits.  He has started taking Tikosyn on the early  morning hours of May 11.  He converted spontaneously on May 12 to a  sinus rhythm.  After his third dose of Tikosyn, his QT interval did not  show any increase on a 500 mcg twice daily dose of Tikosyn.  The patient  is slightly bradycardic at discharge.  His heart rate has been always,  however, above 58 beats per minute.  The patient is asymptomatic with  this, but did have some questions about whether his metoprolol succinate  or his digoxin could be reduced in anyway.  This will be deferred to Dr.  Caryl Comes when the patient sees [name] in the  office.   The patient was discharged on the following medications.  1. Tikosyn 500 mcg 1 tablet every 12 hours.  The patient elects at 9      a.m. and 9 p.m..  2. Diovan 40 mg tablets one-half tablet twice daily.  3. Metoprolol succinate 50 mg daily.  4. Digoxin 0.25 mg daily.  5. Coumadin 6 mg daily except for 4 mg on Tuesdays.  6. Multivitamin daily.  7. Fish oil 2 g daily.   He follows up [facility], [address] to see Dr.  Caryl Comes on Wednesday, May 26 at 2:45 p.m.   LABORATORY STUDIES:  On May 11, hemoglobin 15.1, hematocrit 43.9, white  cells 6.5, platelets are 175.  Serum electrolytes on the day of  discharge, May 13, sodium 141, potassium 4.3, chloride 105, carbonate  30, BUN is 15, creatinine is 0.89, glucose 92.  In contradistinction too  many patients who need to take potassium supplements while on Tikosyn,  this patient has maintained potassium greater than 4 without any  supplementation.  Protime on the day of discharge is 27.8, INR is 2.4,  calcium 9.2, hemoglobin A1c is 5.9 on this admission.      Sueanne Margarita, [place]      Deboraha Sprang, MD, [facility]  Electronically Signed    GM/MEDQ  D:  08/02/2008  T:  08/03/2008  Job:  [id number]   cc:   Dellis Filbert A. Sherren Mocha, MD  Vanna Scotland Olevia Perches, MD, [facility]

## 2010-08-05 NOTE — Assessment & Plan Note (Signed)
[facility]                            CARDIOLOGY OFFICE NOTE   NAME:France, ENIO HORNBACK                      MRN:          [id number]  DATE:04/04/2007                            DOB:          02/27/1941    PRIMARY CARE PHYSICIAN:  Dr. Christie Nottingham.   CLINICAL HISTORY:  Artez Regis returned for follow-up management of  his coronary heart disease and atrial fibrillation.  He had previously  suffered an anterior wall myocardial infarction in 2004 treated with a  TAXUS stent to the LAD with no evidence of restenosis at follow-up cath  in May 2005.  His ejection fraction has remained moderately depressed  with an ejection fraction of 40% by last echo.   He recently developed symptoms of decreased exercise tolerance and  cough, and was found to be an atrial fibrillation by Dr. Rayford Halsted.  He  was started on Cardizem and Coumadin and I saw him a few weeks ago.  After discussing the various options we decided to proceed with  cardioversion after 4 weeks of therapeutic Coumadin.   He has felt reasonably well.  He still has more fatigue than usual and  he says he gets tired at the end of the work day more than he used to.  He has had occasional palpitations.   PAST MEDICAL HISTORY:  1. Hyperlipidemia.  2. Hypertension.  3. An elevated CRP which he said was 4 on recent readings.   HE ALSO HAS HAD SOME INTOLERANCE, HE HAD MARKED ALOPECIA ON COREG AND HE  HAS HAD SOME INTOLERANCE TO STATINS AND IS CURRENTLY TAKING LOW-DOSE  PRAVASTATIN.   CURRENT MEDICATIONS:  Include aspirin, Co Q10, multivitamins, Diovan,  fish oil, celery seed, Pravachol and Cardizem.   EXAMINATION:  Today the blood pressure was 143/81, pulse was 110 and  irregular.  There was no venous distension.  Carotid pulses were full  and there were no bruits.  CHEST:  Clear.  CARDIAC:  Rhythm was regular.  I could hear a short systolic murmur at  the left sternal edge.  ABDOMEN:  Soft with normal  bowel sounds.  There is no  hepatosplenomegaly.  Peripheral pulses were full and there was no peripheral edema.   IMPRESSION:  1. Recent onset atrial fibrillation.  2. Possible recent transient ischemic attack.  3. Coronary artery disease, status post anterior wall myocardial      infarction 2004 treated with a drug-eluting stent to the left      anterior descending.  4. Ejection fraction 40%.  5. Class II systolic heart failure.  6. Intolerance of Coreg due to hair loss.   RECOMMENDATIONS:  I think [place] is doing reasonably well.  His rate is  too fast today and he says it runs in the 80-90 at home so we have  increased his Cardizem from 120 to 180 a day.  Unfortunately he has not  been tolerant to Coreg.  We might consider Toprol in its place since a  beta blocker would be better than a calcium channel blocker with his LV  dysfunction, but we will consider that later.  His INR on December 24  was 1.7 and on January 2 was 2.4 so we still have about 2 more weeks  before he has 4 weeks of therapeutic Coumadin.  He was 2.6 today.  We  will plan to schedule cardioversion in 2 weeks but he would like to at  think about this for a couple days before making a final commitment, he  will give [place] a call.  I will continue weekly ProTimes until then.  He  asked me to discuss his situation with [name] and make sure we were  all in agreement with the current approach.  Since he has been  intolerant to medicines I plan to treat him with cardioversion alone and  reserve rhythm control medications if he has recurrence.  We discussed  the possibility of an atrial fibrillation ablation and I told him  generally we would wait until he is refractory to one of the rhythm  control drugs before considering that.   ADDENDUM  I spoke with [name] about Mr. Maese and he agreed with our current  approach.  His first drug of choice for rhythm control if Mr. Silguero  reverts back to atrial fibrillation  after cardioversion would be  Tikosyn, primarily because with amiodarone, it is a long washout period  if you wanted to switch to another drug.  If he has recurrent  fibrillations, refractory rhythm control on intolerant of rhythm control  dugs, then he may be a candidate for ablation.     Bruce Alfonso Patten Olevia Perches, MD, [facility]  Electronically Signed    BRB/MedQ  DD: 04/04/2007  DT: 04/04/2007  Job #: [id number]   cc:   Pat Patrick. Rayford Halsted, M.D.

## 2010-08-05 NOTE — Procedures (Signed)
 HEALTHCARE                              EXERCISE TREADMILL   NAME:Caleb [name], Caleb [name]                      MRN:          [id number]  DATE:07/25/2007                            DOB:          1940/11/26    PROCEDURE:  Treadmill test.   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  His resting heart rate was 88-90, he  stood up and it went to 103.  He then went through a modification of a  standard Bruce protocol with 1-1/2 minute stages and his heart rates  were 112, 122 and 133, respectively, at the end of the stage.   IMPRESSION:  1. Resting ambient tachycardia.  2. Modestly brisk heart rate response, but not overly excessive.  3. Normal blood pressure response to exercise.   [name] and I then reviewed the options after the treadmill.  We  again outlined the importance of rate control and that he is not  tolerating his bisoprolol, having not increased from 2.5 to 5, but  having gone from 1.25 to 2.5.  Rhythm control, I think then is going to  be our best bet for rate control.  We again then reviewed the role of  Tikosyn in this.  He said he would let us know in the next couple of  days.  I again emphasized that I think trying to get him into sinus  rhythm would augment his likelihood of any procedure being successful.     Deboraha Sprang, MD, [facility]  Electronically Signed    SCK/MedQ  DD: 07/25/2007  DT: 07/25/2007  Job #: [id number]   cc:   Vanna Scotland. Olevia Perches, MD, [facility]

## 2010-08-05 NOTE — Assessment & Plan Note (Signed)
[place]                            CARDIOLOGY OFFICE NOTE   NAME:Caleb [name], Caleb [name]                      MRN:          [id number]  DATE:05/02/2007                            DOB:          Jun 24, 1940    PRIMARY CARE PHYSICIAN:  Dellis Filbert A. Sherren Mocha, MD   CLINICAL HISTORY:  Caleb [name] returns for follow up management of his  coronary heart disease and atrial fibrillation.  He has been in atrial  fibrillation and has been on Coumadin therapy and we have planned  cardioversion.  He was seen in consultation by [name] and is also  seeing Dr. Rayford Halsted and we debated whether to put  him on Tikosyn prior  to his cardioversion.  He has a sensitivity to a number of drug had some  reservations about starting a new drug.   He has been doing reasonably well.  He does get short of breath and  fatigue with very minimal exertion and this is new since he had the  onset of atrial fibrillation.  He also had an echocardiogram done both  at Dr. Cynda Familia office which showed an ejection fraction of 40% and  then one here that [name] ordered which showed ejection fraction of  35-40%.  This represents a slight downward trend from the 40-45%  ejection fraction that he had previously.   He had no chest pain.   PAST MEDICAL HISTORY:  1. Hypertension.  2. Hyperlipidemia  3. Has had elevated CRP.   HE HAS HAD BEEN INTOLERANT OF NUMBER OF MEDICATIONS.  MARKED ALOPECIA ON  COREG AND HAS BEEN INTOLERANT TO MULTIPLE STATINS. Currently taking low  dose of pravastatin.   CURRENT MEDICATIONS:  1. Bisoprolol 5 mg one-quarter half tablet a day down from half tablet      a day that [name] prescribed.  2. Aspirin.  3. Co-Q10.  4. Multivitamins.  5. Diovan 40 mg 1/2 tablet b.i.d.  6. Fish oil.  7. Pravachol 10 mg daily.  8. Cardizem 120 mg daily.  9. Coumadin 4 mg Monday, Wednesday and Friday and 6 mg on the other      days.  His INRs have been greater than 2 for more  than 6 weeks.   ON EXAMINATION:  Blood pressure was 115/73 and pulse 80 irregular.  There is no venous tension.  The carotid pulses were full without  bruits.  CHEST:  Was clear without rales.  CARDIAC:  Rhythm was irregular.  I could hear no definite gallop.  ABDOMEN: Was soft with normal bowel sounds.  There is no  hepatosplenomegaly.  Peripheral pulses were equal.  There is no pedal edema.   IMPRESSION:  1. Recent onset of persistent atrial fibrillation.  2. Possible recent transient ischemic attack.  3. Coronary artery disease status post antral myocardial July 01, 2002 treated with drug-eluting stent to left anterior descending      artery.  4. Ejection fraction 35-40%  5. Class III systolic heart failure.  6. INTOLERANCE COREG DUE TO HAIR LOSS.   I discussed again the options  with Caleb [name] and his wife Caleb [name].  He will  need to remain on Coumadin regardless of which approach we take to his  atrial fibrillation.  He has some concern about potential side effects  of Tikosyn and together we decided to proceed with cardioversion without  a Tikosyn load.  If he does, he realizes this will have a higher  recurrence of atrial fibrillation and if this does happen the next step  would be to give him Tikosyn and bring him in the hospital for Tikosyn  load followed by cardioversion.  Will schedule this next week.  He may  have had pause and conversion when he had an episode that was classified  as a questionable transient ischemic attack.  For this reason, [name]  recommended we be prepared for bradycardia.  When we have the pacing  leads on will be prepared to pace through those leads.     Bruce Alfonso Patten Olevia Perches, MD, [facility]  Electronically Signed    BRB/MedQ  DD: 05/02/2007  DT: 05/02/2007  Job #: [id number]   cc:   Deboraha Sprang, MD, [facility]  Pat Patrick Rayford Halsted, M.D.

## 2010-08-05 NOTE — Assessment & Plan Note (Signed)
[place]                            CARDIOLOGY OFFICE NOTE   NAME:Caleb [name], Caleb [name]                      MRN:          [id number]  DATE:05/11/2007                            DOB:          1940-08-14    I spoke with Caleb [name] last night about options for the getting his  heart back into sinus rhythm from atrial fibrillation.  His INR was down  to 1.9 last week so we were unable to do the cardioversion on Monday.  Our options of 4 more weeks Coumadin TEE cardioversion and we decided to  put him with a TEE cardioversion which was scheduled next week.  Mr.  Omary wanted to do a day when I could be in the hospital and I will try  and schedule for one of these days.  We decided to the cardioversion  initially without a rhythm control drug since he had so much sensitivity  to drugs realizing that there is a moderate chance he would go back into  atrial fibrillation.   INDICATIONS:  Review     Bruce R. Olevia Perches, MD, [facility]  Electronically Signed    BRB/MedQ  DD: 05/11/2007  DT: 05/11/2007  Job #: [id number]

## 2010-08-05 NOTE — Letter (Signed)
June 22, 2007    Caleb [name], [place], [address], [place] [address]   RE:  Caleb, [name]  MRN:  [id number]  /  DOB:  [birth year]   Dear Darnell Level:   It was a pleasure to see Caleb [name] again at your request regarding  his atrial fibrillation.  We spent close to an hour discussing the below  described issues.  As you recall, he underwent cardioversion and failed  to hold sinus rhythm.  Left ventricular function assessments have  continued to be poor in the 35-45% range and this represents a decrease  from his stress scan in 2005 where there was improvement following  myocardial infarction up to about the apex of 50%.   He has not tolerated drugs in the past, as you know, but we put him on  bisoprolol at 2.5 mg a day and he is tolerating it thus far fairly well.  He also takes Cardizem.  His other medications include Diovan 20 b.i.d.,  aspirin, and Coumadin.   EXAMINATION:  His blood pressure is 142/86, his pulse was 92 irregular,  his weight was 174.  He is in no acute distress.  His lungs were clear.  His heart sounds were irregular.  ABDOMEN:  Soft.  Extremities were without edema.  Skin was warm and dry.   Electrocardiogram dated today demonstrated atrial fibrillation 92 with  intervals 0.08/0.36.  The axis was 35 degrees.   IMPRESSION:  1. atrial fibrillation - persistent.  2. Ischemic heart disease with prior anterior wall myocardial      infarction.  3. Cardiomyopathy, ischemic/nonischemic with questioned tachycardia      component.  4. Thromboembolic risk factors notable for hypertension and LV      dysfunction as well as congestive heart failure.   Caleb, [name] had multiple questions and we discussed extensively  the role of catheter ablation.  In the absence of data making it a first  line therapy, specifically looking at heart end points like mortality or  stroke, it is in the guidelines an option that follows antiarrhythmic  drug  therapy and I think it appropriately belongs there, given the  potential risks associated with that procedure.  To that end, I have  suggested that if he would like to pursue a course of catheter ablation  either now or later, in the event that there are data suggesting it is  the appropriate first line therapy, that it would appropriate to pursue  it sooner rather than later so as to see if we can transform has  persistent atrial fibrillation state to a paroxysmal atrial fibrillation  state.   In the interim, however, he would like to pursue rate control.  I think  he is wanting to have something force his hand to pursue the  antiarrhythmic drugs as he is quite leery of the proarrhythmic  potentials.  To that end, we have chosen to double his bisoprolol.  We  will plan to see him again in 2 to 3 weeks at which time we will put him  on a treadmill and see whether there is adequacy of rate control.  I  suspect we will not have that and if he is tolerating the bisoprolol we  will further up-titrate it at that time.  If we get to the point that it  is either ineffective or not tolerated then the issue of antiarrhythmic  drug therapy becomes more easily pursued and, in  that situation, I would  use Tikosyn.   Thank you very much for the consultation.    Sincerely,      Deboraha Sprang, MD, [facility]  Electronically Signed    SCK/MedQ  DD: 06/22/2007  DT: 06/22/2007  Job #: [id number]

## 2010-08-05 NOTE — Discharge Summary (Signed)
NAMEHOMERO, HYSON               ACCOUNT NO.:  [id number]   MEDICAL RECORD NO.:  [id number]          PATIENT TYPE:  INP   LOCATION:  3734                         FACILITY:  [facility]   PHYSICIAN:  Deboraha Sprang, MD, FACCDATE OF BIRTH:  08-11-1940   DATE OF ADMISSION:  06/25/2008  DATE OF DISCHARGE:  06/25/2008                               DISCHARGE SUMMARY   This patient has no known drug allergies.   Time for this dictation as well as explanations to the patient and his  wife greater than 2-1/2 hours.   FINAL DIAGNOSES:  1. Admitted for Tikosyn load.      a.     Permanent atrial fibrillation with controlled ventricular       rate.      b.     Chronic Coumadin therapy.      c.     Symptoms are fatigue and decreased exercise tolerance, not       particularly aware of palpitation, shortness of breath, or chest       discomfort.  2. INR this admission 1.7 - subtherapeutic.  3. Transesophageal echocardiography?      a.     The patient had attempted transesophageal echocardiography,       March 2009, unable to pass scope, procedure aborted.      b.     Transesophageal echocardiography is required to go forward       with this admission, but held secondary to past history.  4. Esophagogram, March 2009, normal function.      a.     No mass, stricture, or web.      b.     Normal esophageal motility.  5. The patient was discharged home on June 25, 2008, with 4-week      weekly surveillance of INR in therapeutic range before readmission      for Tikosyn load.  6. The patient profoundly anxious this admission.      a.     Sleeplessness the night before this admission.      b.     Extreme agitation causes the patient to experience transient       amnesia as to the purpose of this hospitalization, medications,       and their doses.      c.     Mental memory hiatus secondary to anxiety only or indicative       of more profound, incipient physiologic deficit?  7. Once again, the patient  will have 7 weeks of therapeutic Coumadin      before readmission for Tikosyn load.   SECONDARY DIAGNOSES:  1. Anterior myocardial infarction, October 2004.  2. Ischemic cardiomyopathy, ejection fraction 35-40% at      echocardiogram, February 2009.  3. Left heart catheterization, October 2004.  Stent placed to a 99%      proximal left anterior descending stenosis.  4. Acute on chronic congestive failure, December 2009, secondary to      atrial fibrillation, rapid ventricular rate.  5. History of reversible ischemic neurologic deficit/fall, left-sided      weakness less  than 24 hours thought to be a cardioembolic event.  6. New York Heart Association class II chronic systolic congestive      heart failure/  7. Hypertension.  8. Positional vertigo.  9. Echocardiogram, February 2009.  Study shows ejection fraction 35-      40%, akinesis of the entire anteroseptal wall, akinesis of the      entire periapical wall, mild-to-moderate mitral regurgitation, mild      tricuspid regurgitation.   No procedures this admission.  Once again, the TEE was considered but  declined secondary to failed prior attempt at TEE, March 2009.   BRIEF HISTORY:  Mr. Handler is a 70 year old male.  He has a history of  persistent atrial fibrillation that was diagnosed about 18 months ago.  He was admitted on February 29, 2008, with atrial fibrillation, rapid  ventricular rate, found to be in acute on chronic congestive heart  failure.  At that time, he was started on metoprolol and digoxin.  He  now has better rate control.  He has better exercise tolerance and less  fatigue.   He presents now, June 25, 2008, for initiation of Tikosyn.  We will  check potassium, magnesium, digoxin level, estimate creatinine  clearance, obtain INR prior to initiating Tikosyn.   Past medical history included acute anterior wall myocardial infarction,  October 2004.  At that time, he had a catheterization which showed  ejection  fraction of 40% and 99% stenosis in the proximal LAD with  minimal residual coronary artery disease.  He received a stent at that  time.  An echocardiogram more recently in February 2009 shows ejection  fraction of 35-40% with akinesis of the entire anteroseptal wall,  akinesis of the entire periapical wall, mild-to-moderate mitral  regurgitation, and mild tricuspid regurgitation.  In the past in March  2009, the patient had a cardioversion.  He was in sinus rhythm only  hours to days.  In the past, he also had a cardioembolic RIND which  produced left-sided weakness less than 24 hours.   HOSPITAL COURSE:  The patient presents electively, June 25, 2008, for  Tikosyn loading.  Laboratory studies were obtained.  His potassium and  magnesium were within normal limits, but the INR was 1.7.  It was  subtherapeutic.  The patient could not undergo TEE and was discharged.   MEDICATIONS AT DISCHARGE:  1. Metoprolol succinate 50 mg daily.  2. Digoxin 0.25 mg daily.  3. Divan 20 mg twice daily.  4. Multivitamin daily.  5. Minimal supplement daily.  6. Fish oil liquid daily.  7. Coumadin.  At home, he takes 6 mg daily except for 4 mg on Tuesday      and Friday.  He will go home with special dosing of his Coumadin,      Monday, June 25, 2008, 8 mg; Tuesday, June 26, 2008, 8 mg;      Wednesday, June 27, 2008, 6 mg.  He [facility],      June 28, 2008, Thursday, 8:15 in the morning, then again on Monday,      July 02, 2008, at 9:30 a.m., then again on Monday, July 09, 2008,      at 9:15 a.m., then again on Monday, July 16, 2008, at 8:45 a.m.,      then again Wednesday, Jul 26, 2008, at 8:15.  He will see [name]      on Jul 26, 2008, at 8:45.  Perhaps the best would be  to set the      patient up for Tikosyn loading on Tuesday, Jul 31, 2008, with a      protime on Monday, Jul 30, 2008, just to make sure that everything      is really set for this readmission.   LABORATORY  STUDIES THIS ADMISSION:  Protime 21.0, INR 1.7, sodium 140,  potassium 4.6, chloride 103, carbonate 30, BUN is 11, creatinine 0.86,  and glucose 108.  Digoxin level 0.7, magnesium 2.4.      Sueanne Margarita, [place]      Deboraha Sprang, MD, [facility]  Electronically Signed    GM/MEDQ  D:  06/25/2008  T:  06/26/2008  Job:  [id number]   cc:   Pat Patrick. Rayford Halsted, M.D.  Bruce Alfonso Patten Olevia Perches, MD, [facility]  Jory Ee. Sherren Mocha, MD

## 2010-08-05 NOTE — Consult Note (Signed)
Caleb [name], Caleb [name]               ACCOUNT NO.:  [id number]   MEDICAL RECORD NO.:  [id number]          PATIENT TYPE:  INP   LOCATION:  2014                         FACILITY:  [facility]   PHYSICIAN:  Satira Sark, MD DATE OF BIRTH:  1940/08/12   DATE OF CONSULTATION:  08/18/2008  DATE OF DISCHARGE:                                 CONSULTATION   PRIMARY CARDIOLOGIST:  Dr. Eustace Quail.   ELECTROPHYSIOLOGIST:  Dr. Virl Axe.   PRIMARY CARE PHYSICIAN:  Dr. Stevie Kern.   REASON FOR EVALUATION:  Recurrent atrial fibrillation.   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  He has had persistent atrial fibrillation and recently underwent a  Tikosyn load beginning on May 10.  He converted to sinus bradycardia on  May 12 and was discharged on May 13 on the medical regimen outlined in  his discharge summary which included Tikosyn 500 mcg q.12 h.  He was  seen in the office recently by [name] on May 26 and remained in sinus  rhythm at that time with heart rates sometimes in the 40s to 50s noted  at home despite interval discontinuation of digoxin.  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  It was  felt that Toprol XL should be decreased to 25 mg daily and otherwise  continue regular medicines with a 6-week follow-up.   He did well until yesterday evening.  He states that he felt instantly  when he returned to atrial fibrillation complaining of general weakness,  malaise and sense of rapid palpitations.  He spoke with Dr.  Haroldine Laws  on call overnight and it was  recommended that he take an additional  Toprol XL 50 mg tablet.  This was done and he remained in atrial  fibrillation this morning.  He states that he did not sleep much at all  last night.   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  [name] had  recommended a rapid attempt at cardioversion on return of atrial  fibrillation and Dr. Berenice [name] remained very concerned about his symptoms  and persistent dysrhythmia this morning.  I recommended an observation  admission to the hospital for cardioversion and this was arranged to the  2000 telemetry unit.  It was recommended that he stay n.p.o. except for  his medications.   ALLERGIES:  SULFA DRUGS.   MEDICATIONS:  1. Tikosyn 500 mcg p.o. b.i.d.  2. Toprol XL 25 mg p.o daily.  3. Coumadin 6 mg p.o. daily except 4 mg on Tuesdays.  4. Diovan 20 mg p.o. daily.  5. Aspirin 81 mg p.o. daily.  6. Pravachol 20 mg p.o daily.  Past medical history, social history and family history were just  recently reviewed in the history and physical from May 10.  I reviewed  this document.  Please refer to it for further details.  Otherwise there  has been no major interval history other than the recent decrease in  Toprol XL and discontinuation of digoxin.   REVIEW OF SYSTEMS:  Recently feeling well until the return of atrial  fibrillation last night.  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  He otherwise had no significant palpitations  or bleeding problems on Coumadin.  He reports an improving appetite  since stopping digoxin.  His wife mentions that he has had an 8-pound  weight loss over several weeks.  Otherwise reviewed and negative.   PHYSICAL EXAMINATION:  PRESENTING VITAL SIGNS:  Revealed temperature  96.8 degrees, heart rate originally 114 beats per minute  in atrial  fibrillation by telemetry.  Respirations 18, systolic blood pressure  939/03.  Oxygen saturation 98% on room air.  This is a normally nourished appearing male in no acute distress.  HEENT:  Conjunctiva, lids grossly normal.  NECK:  Reveals no elevated jugular venous pressure.  No loud bruits.  No  thyromegaly.  LUNGS:  Clear without labored breathing at rest.  CARDIAC:  On my examination (in sinus rhythm) a regular rate and rhythm  without S3 gallop.  Soft systolic murmur heard towards the base.  Second  heart sound preserved.  No diastolic murmur.  EXTREMITIES:  Exhibit no obvious edema.   LABORATORY DATA:  WBCs 5.7, hemoglobin 15.2, hematocrit 44.6, platelets  189, INR 2.3.  Sodium 142, potassium 4.9, chloride 107, bicarb 28,  glucose 103, BUN 10, creatinine 0.86.   IMPRESSION:  1. Recurrent atrial fibrillation with mildly to modestly increased      ventricular response, symptom onset late last evening.  Recent      interval history includes chemical cardioversion to sinus rhythm      following Tikosyn load with recent discontinuation of digoxin and      also a recent decrease in Toprol XL dosing from 50 mg daily to 25      mg daily related to bradycardia (although not technically      symptomatic).  [name] recommended a plan for fairly rapid      cardioversion with recurrent atrial fibrillation and this was in      fact our plan today.  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  After being placed on telemetry, he however      spontaneously converted to sinus bradycardia, although ultimately      with heart rate settling in the 60s, a little before 11:00 a.m..      Follow-up electrocardiogram showed sinus bradycardia with      nonspecific ST-T wave changes.  He reported feeling better      symptomatically.  I discussed this with the patient and his wife.  2. Cardiovascular  disease status post previous anterior wall      myocardial infarction in November 2004 with drug-eluting stent      placement to left anterior descending and subsequent ischemic      cardiomyopathy with a left ventricular ejection fraction of 35 to      40%. He is not reporting  any recent angina on medical therapy.  3. Hypertension, well-controlled today.  4. Hyperlipidemia, on statin therapy.  5. Chronic anticoagulation on Coumadin related to atrial fibrillation.   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  I have recommended that he continue  his present medical regimen, except to increase Toprol XL back to 50 mg  in the morning.  He indicates that he felt quite well on that dose and  is already following his heart rate regularly at home.  I did explain it  was still important to watch for development of significant /  symptomatic bradycardia.  He will continue his regular follow-up in the  [facility]madin Clinic and already has a visit scheduled to see [name] back  over the next 6 weeks.  If he reverts to atrial fibrillation, I have  recommended he take an additional 25 mg Toprol XL and see if this helps  to resolve things within 24 hours.  If his dysrhythmia persists however,  it was recommended that he contact us just as he was instructed to do  before.  He was comfortable with this.      Satira Sark, MD  Electronically Signed     SGM/MEDQ  D:  08/18/2008  T:  08/18/2008  Job:  [id number]   cc:   Dellis Filbert A. Sherren Mocha, MD  Deboraha Sprang, MD, [facility]  Bruce R. Olevia Perches, MD, [facility]

## 2010-08-05 NOTE — H&P (Signed)
NAMEYUEPHENG, SCHALLER               ACCOUNT NO.:  [id number]   MEDICAL RECORD NO.:  [id number]          PATIENT TYPE:  INP   LOCATION:  2007                         FACILITY:  [facility]   PHYSICIAN:  Deboraha Sprang, MD, FACCDATE OF BIRTH:  October 11, 1940   DATE OF ADMISSION:  07/30/2008  DATE OF DISCHARGE:                              HISTORY & PHYSICAL   PRIMARY CARE PHYSICIAN:  Dellis Filbert A. Sherren Mocha, MD   PRIMARY CARDIOLOGIST:  Vanna Scotland Olevia Perches, MD, [facility]   ELECTROPHYSIOLOGIST:  Deboraha Sprang, MD, [facility]   CHIEF COMPLAINT:  Tikosyn load.   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  He has  had problems with rate control and because of that he has had problems  with volume control and heart failure.  The situation was discussed and  he is here today for Tikosyn loading.  His Coumadin has been documented  as therapeutic and therefore he is appropriate for Tikosyn load and  being admitted for the same.  Currently, he is not experiencing any  chest pain or shortness of breath, also his heart rate is not that well  controlled with exertion.   PAST MEDICAL HISTORY:  1. Permanent atrial fibrillation.  2. Status post anterior wall MI in November 2004 with Taxus stent to      the LAD.  3. Ischemic cardiomyopathy with an EF of 35-40% by echocardiogram in      February 2009.  4. Status post direct current cardioversion in February and March      2009.  5. Hypertension.  6. Hyperlipidemia.  7. Chronic anticoagulation with Coumadin.  8. Positional vertigo.  9. Possible history of TIA.  93.YBOFB systolic CHF in 5102 with a history of chronic systolic CHF.  11.Gastroesophageal reflux disease.  12.Status post cardiac catheterization in 2005 showing no in-stent      restenosis in the LAD, mid LAD 60%, small diagonal-1 ostial 99%,      circumflex no disease, RCA 30%, an EF 45-50%, medical therapy      recommended.   SURGICAL HISTORY:  He is status post cardiac catheterization x2 as well  as direct current cardioversion x2 and right shoulder surgery.  Transesophageal echocardiogram has been attempted, but has been  unsuccessful.   ALLERGIES:  SULFA.   CURRENT MEDICATIONS:  1. Diovan 40 mg one-half tablet b.i.d.  2. Metoprolol ER 50 mg a day.  3. Digoxin 250 mcg daily.  4. Coumadin as directed.  5. Fish oil 2 g daily.  6. Multivitamin daily.   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  He has no history of alcohol, tobacco, or drug abuse and  walks on a regular basis.  He is on a heart healthy diet.   FAMILY HISTORY:  His mother was 9 when she died and his father was 74,  but he has no information on either one of them as he was adopted and he  has no siblings.   REVIEW OF SYSTEMS:  He  has had no fevers, chills, or sweats.  He has  occasional palpitations.  He is not having any shortness of breath,  dyspnea on exertion, orthopnea, PND, or edema.  He has had no  presyncope.  He is not having claudication symptoms, cough, or wheezing.  He is not having chest pain.  He has rare constipation but is currently  not having any GI symptoms at all.  He has rare arthralgias.  He has  some anxiety about the atrial fibrillation and various treatments.  He  has some problems with poor sleeping, although he is able to sleep some  and does not seem to followup back on insomnia.  Full 14-point review of  systems is otherwise negative.   PHYSICAL EXAMINATION:  VITAL SIGNS:  Temperature is 96.8, blood pressure  162/83, heart rate 65, respiratory rate 18, and O2 saturation 100% on  room air.  GENERAL:  He is a well-developed, well-nourished white male in no acute  distress.  HEENT:  Normal.  NECK:  There is no lymphadenopathy, thyromegaly, bruit, or JVD noted.  CVA:  His heart is irregular in rate and rhythm with an S1 and S2 and a  soft diastolic murmur is  noted.  Distal pulses are intact in all 4  extremities and no femoral bruits are appreciated.  LUNGS:  Essentially clear to auscultation bilaterally.  SKIN:  No rashes or lesions are noted.  ABDOMEN:  Soft and nontender with active bowel sounds.  EXTREMITIES:  There is no cyanosis, clubbing, or edema noted.  MUSCULOSKELETAL:  There is no joint deformity or effusions and no spine  or CVA tenderness.  NEURO:  He is alert and oriented.  Cranial nerves II through XII grossly  intact.   EKG is atrial fibrillation, rate 82 with no acute changes.   Laboratory values from Jul 26, 2008, shows sodium 140, potassium 4.2,  chloride 105, CO2 of 31, BUN 14, creatinine 0.9, and glucose 114.  Digoxin 0.5.   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.  He has permanent  atrial fibrillation and a history of coronary artery disease with  ischemic cardiomyopathy and left ventricular dysfunction.  Because of  the transient ischemic attack and history of hypertension and decreased  ejection fraction and borderline age, it is appropriate to  try to maintain sinus rhythm.  Of note, his magnesium was 2.4.  Dr.  Caryl Comes reviewed the data and recommended beginning Tikosyn 500 mcg q.12  h.  We will check a hemoglobin A1c because of the hyperglycemia and  follow his INR closely.  He will be continued on his other home  medications with adjustments made as needed.      Rosaria Ferries, PA-C      Deboraha Sprang, MD, [facility]  Electronically Signed    RB/MEDQ  D:  07/30/2008  T:  07/31/2008  Job:  [id number]

## 2010-08-05 NOTE — Assessment & Plan Note (Signed)
Caleb [name]                            CARDIOLOGY OFFICE NOTE   NAME:Caleb [name], Caleb [name]                      MRN:          [id number]  DATE:03/08/2007                            DOB:          10-22-1940    PRIMARY CARE PHYSICIAN:  Dr. Christie Nottingham.   CLINICAL HISTORY:  Caleb [name] returned for a followup visit for  evaluation and management of recent onset atrial fibrillation and  coronary heart disease.  He suffered an anterior wall myocardial  infarction in 2004 treated with a TAXUS stent to the LAD and had a  followup cath in May 2005 which showed no evidence of restenosis.  His  ejection fraction had been 40% by last echo.   Recently he developed symptoms of cough and then some decreased exercise  tolerance.  He felt some irregularity in his heart and saw Dr. Enid Derry  and he was found to be in atrial fibrillation.  He was started on  Cardizem and Coumadin.  Four days after he started on Coumadin he had a  sudden drop attack where his legs gave way.  EMS was called and by the  time they arrived he was stabilized and elected not to go to the  hospital.  It was felt that he might have had a TIA.   Since that time he has done fairly well as his palpitations have been  minimal and he has had no recurrent neurological symptoms.  He has had  decreased exercise tolerance when he tried to walk on the treadmill.  He  has had only twinges of chest pain.   PAST MEDICAL HISTORY:  1. Hyperlipidemia.  2. Hypertension.  3. Elevated CRP.   He has been very sensitive to multiple medications.  HE HAD ALOPECIA  FROM COREG, HE HAS NOT BEEN ABLE TO TOLERATE SOME OF THE STATINS but  currently is  able to take pravastatin.   EXAMINATION:  Today the blood pressure was 134/77 and the pulse 100 and  irregular.  There was no venous distention.  Carotid pulses were full  and there were no bruits.  CHEST:  Clear without rales or rhonchi.  CARDIAC:  Rhythm was irregular,  I could hear no murmurs or gallops.  ABDOMEN:  Soft with normal bowel sounds.  There is no  hepatosplenomegaly.  Peripheral pulses were full and there is no peripheral edema.   An electrocardiogram showed atrial fibrillation with a rate of 100.  There was an evidence of an old anterior infarction.   IMPRESSION:  1. Recent onset atrial fibrillation.  2. Possible recent transient ischemic attack.  3. Coronary artery disease status post anterior wall myocardial      infarction October 2004 with no evidence of restenosis at last      catheterization with a patent TAXUS stent in the left anterior      descending.  4. Ischemic cardiomyopathy with ejection fraction of 40%.  5. Class II systolic heart failure.  6. INTOLERANCE TO COREG DUE TO HAIR LOSS.   RECOMMENDATIONS:  Caleb [name] is symptomatic from his atrial fibrillation and  I think we  should try and restore sinus rhythm.  His INR was therapeutic  at 2.2 today but his previous one was not so we will need 4 weeks of  therapeutic INR before elective cardioversion.  We will put him on  weekly ProTime checks in order to monitor this.  I will see him back in  4 weeks with plans for scheduling cardioversion at that time.  Will get  a CBC, BMP and TSH today.  I told him in the longterm it would be  preferable to have him on a beta blocker rather than a calcium channel  blocker for his left ventricular dysfunction but we will make that  change today.     Bruce Alfonso Patten Olevia Perches, MD, [facility]  Electronically Signed    BRB/MedQ  DD: 03/08/2007  DT: 03/08/2007  Job #: [id number]   cc:   Dellis Filbert A. Sherren Mocha, MD

## 2010-08-08 NOTE — Discharge Summary (Signed)
NAMETHADD, APUZZO                           ACCOUNT NO.:  [id number]   MEDICAL RECORD NO.:  [id number]                   PATIENT TYPE:  INP   LOCATION:  2013                                 FACILITY:  [facility]   PHYSICIAN:  Eustace Quail, M.D.                  DATE OF BIRTH:  September 12, 1940   DATE OF ADMISSION:  01/23/2003  DATE OF DISCHARGE:  01/26/2003                           DISCHARGE SUMMARY - REFERRING   DISCHARGE DIAGNOSES:  1. Congestive heart failure, resolved.  2. Left lower-lobe pneumonia, treated.  3. Recent anterior wall myocardial infarction with stent placement to the     left anterior descending.  4. Anticoagulation secondary to LV thrombus.  5. Hyperlipidemia, treated.  6. Gastroesophageal reflux disease.  7. Allergic rhinitis.  8. Hypertension.   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  He ended up having a stent placed to the LAD.  Echocardiogram  revealed an EF of 35% with an LV thrombus.  Therefore, Coumadin was started.  He was discharged on January 22, 2003, but has noted more shortness of  breath since that time.   In the emergency room, a CT scan of the chest, and a chest x-ray showed  bilateral lower-lobe edema, small pleural effusion and pulmonary venous  congestion, but no pulmonary emboli.  At this point, CHF was treated  aggressively, and the patient had a pulmonary consultation by Dr. Lyda Jester.  By discharge, Dr. Joya Gaskins felt the patient did have a left-lower  lobe pneumonia, and he was treated with Avelox as well as Protonix and  Flonase.   Because of the patient's ejection fraction of 35%, it was felt that he would  be best benefited from switching his Lopressor over to Coreg.  Please note  the patient did have some diarrhea that was felt to be secondary to Crestor,  and his was decreased to half a 40 mg tablet.   LABORATORY DATA:   During the hospital stay, other lab work included sodium  136, potassium 4.1, BUN 13, creatinine 0.9.  INR 2.1, hemoglobin 12.9,  hematocrit 38.4, platelets 264.  BNP 239.  ESR 4.  ANA negative.   DISCHARGE MEDICATIONS:  1. Protonix 40 mg a day.  2. Flonase two sprays to each nostril daily.  3. Avelox one a day for six days.  4. Plavix 75 mg a day.  5. Baby aspirin daily.  6. Crestor 40 mg, one-half tablet daily.  7. Altace 5 mg a day.  8. Coumadin 5 mg a day.  9. Potassium 20 mEq daily.  10.      Coreg 12.5 mg po b.i.d.  11.      Lasix 40 mg a day.    DISCHARGE INSTRUCTIONS:  1. Activity, rest over the next several days.  2. Remain on  a low fat diet.  3. Call for questions or concerns.  4. Keep a followup appointment with Dr. Loletha Grayer next week.  5. He is to go to Dr. Glade Stanford office for lab work on Monday for a BNP, BMP     and PT/INR.  6. The patient has a followup appointment with Dr. Joya Gaskins on February 01, 2003, at 9:45 a.m.      Joesphine Bare, P.A. [facility]                      Eustace Quail, M.D.    LB/MEDQ  D:  01/26/2003  T:  01/27/2003  Job:  [id number]   cc:   Asencion Noble, M.D. [facility]   Eustace Quail, M.D.

## 2010-08-08 NOTE — Cardiovascular Report (Signed)
NAMENYAN, DUFRESNE                           ACCOUNT NO.:  [id number]   MEDICAL RECORD NO.:  [id number]                   PATIENT TYPE:  INP   LOCATION:  1825                                 FACILITY:  [facility]   PHYSICIAN:  Eustace Quail, M.D.                  DATE OF BIRTH:  [birth year]   DATE OF PROCEDURE:  01/18/2003  DATE OF DISCHARGE:                              CARDIAC CATHETERIZATION   PROCEDURE:  Cardiac catheterization and percutaneous coronary intervention.   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  He does have a  history of hypertension.  He had developed chest pain yesterday and was  scheduled to see Dr. Harrington Challenger this morning.  Last night at midnight he developed  severe chest pain and came to [facility] emergency room, where his EKG  showed an acute anterior wall myocardial infarction.  He was seen by Champ Mungo. Lovena Le, M.D., and transported to the catheterization lab for angiography  and probable intervention.   DESCRIPTION OF PROCEDURE:  The procedure was performed via the right femoral  artery using an arterial sheath and 6 French preformed coronary catheters.  A front wall arterial puncture was performed and Omnipaque contrast was  used.  After completion of the diagnostic study, we made a decision to  proceed with intervention on the LAD.   The patient had been given heparin in the emergency room and was given  additional heparin to keep the ACT over 200 seconds.  He was given aspirin  in the emergency room and was given 300 mg of Plavix in the catheterization  lab and was given a double-bolus Integrilin infusion in the catheterization  lab.  We used a 7 [place] guiding catheter and a short PT2 light support  wire.  We crossed the lesion in the proximal LAD with the wire without  difficulty.  We pre-dilated a 3.0 x 15 mm Quantum Maverick, performing two  inflations up  to eight atmospheres for 30 seconds.  We then deployed a 2.75  x 16 mm Taxus stent, deploying this with one inflation of 16 atmospheres for  35 seconds.  We then post-dilated the proximal and midportion of the stent  with a 3.5 x 9 mm Quantum Maverick, performing two inflations up to 14  atmospheres for 30 seconds.  Repeat diagnostic study was then performed with  the guiding catheter.  The patient tolerated the procedure well and left the  laboratory in satisfactory condition.   RESULTS:  1. Left main:  The left main coronary artery was free of significant     disease.  2. Left anterior descending artery:  The left anterior descending artery     gave rise to two large diagonal branches and two small diagonal branches     and two septal perforators.  There was a 99% stenosis in the proximal LAD     after the first diagonal branch and before the first septal perforator     with TIMI-2 flow distally.  There were 40% stenoses in the mid- and     distal vessel.  3. Circumflex artery:  The circumflex artery gave rise to a ramus branch and     an AV branch, which terminated into two posterolateral branches.  There     was 40% narrowing in the mid-circumflex artery.  4. Right coronary artery:  The right coronary artery was a moderate-sized     vessel that gave rise to a conus branch, two right ventricular branches,     a posterior descending branch, and a posterolateral branch.  These     vessels were free of significant disease.  5. Left ventriculogram:  The left ventriculogram performed in the RAO     projection showed hypokinesis of the anterolateral wall and akinesis of     the apex.  The estimated ejection fraction was 40%.  6. The aortic pressure was 107/67 with mean of 88.  Left ventricular     pressure was 107/28.   Following stenting of the lesion, the proximal LAD stenosis improved from  99% to 0% and the flow improved from TIMI-2 to TIMI-3 flow.   CONCLUSION:  1. Acute anterior  wall myocardial infarction with 99% stenosis in the     proximal left anterior descending artery, 40% narrowing in the mid- and     40% narrowing in the distal left anterior descending artery, with TIMI-2     flow, 40% narrowing in the mid-circumflex artery, no major obstruction of     the right coronary artery, and anterolateral wall hypokinesis and apical     wall akinesis, with an estimated ejection fraction of 40%.  2. Successful stenting of the lesion in the proximal left anterior     descending artery with improvement of sentinel narrowing from 99% to 0%     and improvement of flow from TIMI-2 to TIMI-3 flow.   DISPOSITION:  The patient was retained for further observation.                                                Eustace Quail, M.D.    BB/MEDQ  D:  01/18/2003  T:  01/18/2003  Job:  [id number]   cc:   Champ Mungo. Lovena Le, M.D.   Ishmael Holter. Sarajane Jews, M.D. [facility]   Dellis Filbert A. Sherren Mocha, M.D. [facility]   Cardiopulmonary Lab

## 2010-08-08 NOTE — Assessment & Plan Note (Signed)
[facility] OFFICE NOTE   NAME:Caleb [name], Caleb [name]                      MRN:          [id number]  DATE:02/26/2006                            DOB:          July 05, 1940    PRIMARY CARE PHYSICIAN:  Dellis Filbert A. Sherren Mocha, M.D.   CLINICAL HISTORY:  Caleb [name] is back for a followup management of  his coronary heart disease.  He suffered a large anterior MI in October  2004, and was treated with a TAXUS stent to the LAD.  He had a followup  cath performed in May 2005, and had no evidence of restenosis.  He has  done well.  His ejection fraction was initially depressed but had  improved to 40% by last echo.  He says he has been doing fairly well  with his heart.  He has had no chest pain, shortness of breath, or  palpitations.  However, he had to stop his Coreg when he developed  severe hair loss and his hair changed to white.  He was seen at [facility] and Sallye Lat apparently presented him there and they  felt it was related to [place].  They were not sure if his hair loss would  recover.   PAST MEDICAL HISTORY:  1. Hyperlipidemia.  2. Hypertension.  3. Elevated CRP.   CURRENT MEDICATIONS:  Aspirin, CoQ-10, multivitamin, Diovan, fish oil,  Vytorin, cinnamon, celery seed, and vitamin D.   PHYSICAL EXAMINATION:  VITAL SIGNS:  Blood pressure is 143/60 and pulse  65 and regular.  NECK:  There was no vein distention.  The carotid pulses were full  without bruits.  CHEST:  Clear without rales or rhonchi.  CARDIAC:  Rhythm was regular.  The heart sounds were normal.  I could  hear no murmurs or gallops.  ABDOMEN:  Soft without organomegaly.  EXTREMITIES:  Peripheral pulses were full.  There is no peripheral  edema.   An electrocardiogram showed sinus rhythm and an old anterior wall  infarction.  His recent cholesterol reading was a total of 145, an HDL  of 47, and LDL of 88.  His CRP had come from 3.1 to 2.   IMPRESSION:  1. Coronary artery disease, status post anterior wall myocardial      infarction, October 2004, with no evidence of restenosis at last      catheterization.  2. Ischemic cardiomyopathy with ejection fraction of 40% by last      catheterization.  3. Hyperlipidemia.  4. Previous elevated CRP.  5. Hair loss, felt probably secondary to Coreg.   RECOMMENDATIONS:  I think Caleb [name] is doing well.  Unfortunately, he is  off a beta-blocker now.  I told him one alternative would be to try him  on another beta-blocker namely, metoprolol.  Together, we decided to  wait on this.  He is seeing Dr. Rayford Halsted in the near future.  He is  supposed to have an echocardiogram in 2 weeks.  If his function  declines, then we may consider re-instituting a different beta-blocker.  I will plan to see him back in  followup in 6 months.     Bruce Alfonso Patten Olevia Perches, MD, [facility]  Electronically Signed    BRB/MedQ  DD: 02/26/2006  DT: 02/26/2006  Job #: [id number]   cc:   Dellis Filbert A. Sherren Mocha, MD  Pat Patrick. Rayford Halsted, M.D.

## 2010-08-08 NOTE — Discharge Summary (Signed)
Caleb [name], Caleb [name]                           ACCOUNT NO.:  [id number]   MEDICAL RECORD NO.:  [id number]                   PATIENT TYPE:  INP   LOCATION:  3709                                 FACILITY:  [facility]   PHYSICIAN:  Champ Mungo. Lovena Le, M.D.               DATE OF BIRTH:  Sep 29, 1940   DATE OF ADMISSION:  01/18/2003  DATE OF DISCHARGE:  01/22/2003                           DISCHARGE SUMMARY - REFERRING   PROCEDURE:  1. Cardiac catheterization.  2. Coronary arteriogram.  3. Left ventriculogram.  4. Taxus stent to one vessel.  5. 2-D echocardiogram.   HOSPITAL COURSE:  Mr. Barkdull is a 70 year old male with no known history of  coronary artery disease.  He has a history of hypertension and was recently  diagnosed with hyperlipidemia.  His pain began at approximately midnight  which woke him from sleep and was associated with diaphoresis as well as  shortness of breath.  He had anterior ST elevations and was taken urgently  to the catheterization laboratory.  Mr. Petrosky had a cardiac catheterization  that showed no critical disease except for a 99% LAD proximally.  This was  treated with a Taxus stent reducing the stenosis to 0.  The flow went from  TIMI 2 to TIMI 3.  His EF was 40% with anterior hypokinesis and apical  akinesis.   Mr. Berkowitz tolerated the procedure and sheath pull without incident.  He had  congestive heart failure secondary to his MI and was diuresed successfully.  He had been on a combination product of atenolol and chlorthalidone prior to  admission but this was discontinued and he was started on metoprolol which  was up titrated to control his blood pressure and heart rate as well as  Lasix.  The Lasix was changed to p.o. as his symptoms improved.   Mr. Wegmann was hypokalemic upon admission with potassium level 3.3.  Despite  supplementation up to 40 mEq a day he remained hypokalemic with a potassium  at discharge at 3.3.  He is to be started on a daily  potassium  supplementation of 20 mEq a day and he is to be given 40 mEq prior to  discharge.  He will have his potassium level checked in nine days.   Because of the apical akinesis, a 2-D echocardiogram was performed to rule  out left ventricular thrombus.  His RV pressure was minimally elevated at 35  systolic.  His EF was 35% with echo densities along distal anterior wall  into his apex suspicious for thrombus formation.  Left ventricular size and  wall thickness was mildly increased.  There was trivial aortic valvular  regurgitation.  No pericardial effusion was seen.  Because of this he was  started on Coumadin.  He was evaluated by Eustace Quail, M.D. who did not  feel that cross coverage with heparin or Lovenox was indicated.   By January 22, 2003 Mr.  Taranto had been seen by cardiac rehabilitation and  had been given a walking program.  He is ambulating in the room and in the  hallways without chest pain or shortness of breath.  He had a lipid profile  checked which showed a total cholesterol of 152, triglycerides 83, HDL 40,  LDL 95.  It was felt that he needed some improvement in his LDL and HDL but  he is to optimize his diet and add exercise to his daily regimen.  If these  are not successful his medications will be changed.  His rhythm strips were  reviewed and he was found to be in sinus rhythm/sinus tachycardia with  frequent PACs.  His beta blocker was up titrated to control this.  Mr.  Liby was considered stable for discharge on January 22, 2003.   INR at discharge 1.3.  Hemoglobin 12.9, hematocrit 38.0, WBC 8.0, platelets  209,000.  Sodium 137, potassium 3.3, chloride 103, CO2 30, BUN 11,  creatinine 1.0, glucose 105.   CONDITION ON DISCHARGE:  Improved.   DISCHARGE DIAGNOSES:  1. Acute anterior myocardial infarction status post Taxus stent to the left     anterior descending.  2. Left ventricular dysfunction with an ejection fraction of 40% at     catheterization  and 35% by echocardiogram.  3. Left ventricular thrombus by echocardiogram.  4. Hyperlipidemia.  5. Hypertension.  6. Congestive heart failure.  7. Anticoagulation with Coumadin secondary to left ventricular thrombus.  8. Hypokalemia.   DISCHARGE INSTRUCTIONS:  His activity level is to include no strenuous  activity until cleared by M.D.  He is to stick to a low fat and salt diet.  He is to call the office for problems with the catheterization site.  He is  to [facility] on Wednesday, November 3 at 3 p.m. and  follow up with Eustace Quail, M.D. on November 10 at noon and get a BMET at  the office visit.   DISCHARGE MEDICATIONS:  1. Aspirin 81 mg daily.  2. Plavix 75 mg daily.  3. Nitroglycerin p.r.n.  4. Crestor 40 mg daily.  5. Metoprolol 50 mg one and a half tablets b.i.d.  6. Coumadin 5 mg q.p.m. or as directed.  7. Altace 5 mg daily.  8. K-Dur 20 mEq daily.  9. Lasix 20 mg daily.      Davis Gourd, P.A. [facility]                  Champ Mungo. Lovena Le, M.D.    RG/MEDQ  D:  01/22/2003  T:  01/22/2003  Job:  [id number]   cc:   Eustace Quail, M.D.

## 2010-08-08 NOTE — Consult Note (Signed)
NAMEGUNTER, CONDE                           ACCOUNT NO.:  [id number]   MEDICAL RECORD NO.:  [id number]                   PATIENT TYPE:  INP   LOCATION:  2013                                 FACILITY:  [facility]   PHYSICIAN:  Asencion Noble, M.D. [facility]            DATE OF BIRTH:  11-Sep-1940   DATE OF CONSULTATION:  01/25/2003  DATE OF DISCHARGE:                                   CONSULTATION   CHIEF COMPLAINT:  Chronic cough, pleural effusions, and pulmonary  infiltrate.  Evaluate for cause.   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  The patient was admitted and found to have ejection fraction of 40% at  catheterization and ejection fraction of  35% on echo with LV thrombus and  had his LAD stented.  He had anticoagulation started because of the LV  thrombus, has known hyperlipidemia and hypertension previously.  The patient  also has some low stools after his discharge.  He was discharged on January 22, 2003, and found to have increasing shortness of breath, orthopnea and  cough.  He states he actually had some of this prior to discharge.  Chest x-  ray prior to discharge on January 19, 2003, had shown pulmonary edema and  atelectasis at the bases.  Chest x-ray on readmission reconfirmed this.  The  patient also notes prior to his myocardial infarction, symptoms of postnasal  drainage and allergy type symptoms with rhinitis, no fever.  The patient is  a lifelong nonsmoker, no previous history of known lung disease, did have  some dyspnea for the last two or three months prior to this acute event.  The patient notes the cough to be dry, nonproductive, denies any fever  currently, no chills or sweats.  Cough is worse when he lay flat.  He also  notes some reflux symptoms over the past several months that resolved with  Pepcid therapy.   ALLERGIES:  SULFA.   CURRENT  MEDICATIONS:  1. Aspirin 81 mg daily.  2. Plavix 75 mg daily.  3. Crestor 40 mg daily.  4. Lopressor 75 mg b.i.d.  5. Lasix 20 mg daily.  6. Potassium 20 mEq daily.  7. Altace 5 mg daily.  8. Coumadin 5 mg daily.   PAST MEDICAL HISTORY:  As noted above.   PAST SURGICAL HISTORY:  1. Cardiac catheterization.  2. Arthroscopic surgery of the right shoulder.   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  He  does not smoke, abuse alcohol or drugs.   FAMILY HISTORY:  The patient was adopted.  He does not really  have good  family data.   PHYSICAL EXAMINATION:  VITAL SIGNS:  Temperature 97, blood pressure 120 to  100/60, pulse 75, respiratory rate 20, saturation 95% on room air.  HEENT:  Mild nasal inflammation.  Oropharynx clear.  CHEST:  Decreased breath sounds at the bases and decreasing rales and  dullness to percussion one fourth way up on bilateral lower lung zones.  These areas are improved compared to admission on January 23, 2003.  CARDIOVASCULAR:  Regular rate and rhythm with an S3 gallop, normal S1 and  S2.  ABDOMEN:  Soft, liver slightly enlarged, nontender.  EXTREMITIES:  No edema or clubbing.  Pulses 2+ and symmetric.  Extremities  are warm and perfused.   LABORATORY DATA:  ESR was 4.  BNP 511.  White count 7.5, hemoglobin 12.9.  Sodium 136, potassium 4.1, chloride 104, CO2 25, BUN 13, creatinine 0.9,  blood sugar 102.  Troponin  and CK-MBs were persistently elevated but  thought to be due to persisting myocardial infarction.  Numbers were  trimming down compared to the January 18, 2003, numbers.  ANA values are  pending.   CT scan of the chest and chest x-ray show bilateral lower lobe edema and  small pleural effusions and pulmonary venous congestion but no pulmonary  emboli.   IMPRESSION:  1. This 70 year old white male status post acute anterior wall myocardial     infarction with decreased ejection fraction, status post stenting to  the     left anterior descending with recurrent congestive heart failure with     lower lobe edema and pleural effusions that was persisting and present at     last admission and now persists to this admission, with elevated B     natriuretic peptide, bilateral lower lobe atelectasis, small pleural     effusions.  2. Allergic rhinitis.  3. Gastroesophageal reflux disease.  4. No primary lung disease.  5. No pneumonia.  6. No pulmonary embolism.  7. History of hypertension and hyperlipidemia.  8. Cough appears to be due to the congestive heart failure, allergic     rhinitis, reflux disease, possible the ACE inhibitor is contributing but     is not a major factor.   RECOMMENDATIONS:  Continue ACE inhibitor as currently dosing.  Begin Flonase  two sprays each nostril daily.  Begin Protonix 40 mg daily.  Administer  incentive spirometry over four hours while awakened on discharge.  Follow-up  chest x-ray at this point, no thoracentesis is needed.  Continue to treat  congestive failure aggressively as you are doing.                                               Asencion Noble, M.D. [facility]    PW/MEDQ  D:  01/25/2003  T:  01/25/2003  Job:  [id number]   cc:   Dellis Filbert A. Sherren Mocha, M.D. [facility]   Eustace Quail, M.D.

## 2010-08-08 NOTE — Cardiovascular Report (Signed)
[name], [name]                           ACCOUNT NO.:  [id number]   MEDICAL RECORD NO.:  [id number]                   PATIENT TYPE:  OIB   LOCATION:  2899                                 FACILITY:  [facility]   PHYSICIAN:  Eustace Quail, M.D. [facility]              DATE OF BIRTH:  [birth year]   DATE OF PROCEDURE:  07/25/2003  DATE OF DISCHARGE:  07/25/2003                              CARDIAC CATHETERIZATION   PROCEDURE:  Cardiac catheterization and intravascular ultrasound.   CLINICAL HISTORY:  Caleb [name] is 70 years old and suffered an anterior  wall infarction last October, treated with stenting of the LAD.  He  developed mild heart failure about a week after his infarct, which required  re-admission.  He has done well since that time and had a followup  Cardiolite scan which showed an apical scar with possible superimposed  ischemia.  We made a decision to bring him back in for evaluation with  angiography.   PROCEDURE IN DETAIL:  The procedure was performed via the right femoral  artery using an arterial sheath and 6-French pigtail and coronary catheters.  __________ was performed and Omnipaque contrast was used.   After completing a diagnostic study, we made a decision to perform IVUS on  the lesion in the mid LAD.  We used a 6-French JL-4 guiding catheter and a  short floppy wire, and an Atlantis IVUS catheter.  We performed automatic  pullback.  Repeat diagnostic studies were then performed through the guiding  catheter.  The patient tolerated the procedure well and left the laboratory  in satisfactory condition.   RESULTS:  1. LEFT MAIN CORONARY ARTERY:  The left main coronary artery was free of     significant disease.  2. LEFT ANTERIOR DESCENDING ARTERY:  The left anterior descending artery     gave rise to four diagonal branches and two septal perforators.  There     was 0% stenosis at the stent site in the proximal LAD.  Angiographically,     there appeared to be 60%  narrowing in the mid LAD after the second     diagonal branch.  There was also a 90% ostial narrowing of a very small     first diagonal branch.  3. CIRCUMFLEX CORONARY ARTERY:  The circumflex artery gave rise to a large     ramus branch and an AV branch which terminated into two posterolateral     branches.  These vessels were free of significant disease.  4. RIGHT CORONARY ARTERY:  The right coronary artery is a moderate-sized     vessel which gave rise to two right ventricular branches, a posterior     descending branch, and two posterolateral branches.  There was 30%     proximal narrowing and irregularity in the mid portion.  5. LEFT VENTRICULOGRAM:  Left ventriculogram performed in the RAO projection     showed hypokinesis  of the anterolateral wall and hypokinesis of the apex.     The estimated ejection fraction was 45-50%.  6. IVUS MEASUREMENTS:  The IVUS measurements in the LAD showed a reference     area in the mid LAD of 7 mm2 with a diameter of 3 mm and, at the lesion     in the mid LAD, the area was 3.2 square millimeters with a diameter of     2.0 mm.  This gave a percent area of stenosis of 54%.  Based on these     measurements, we did not feel the lesion was tight enough to warrant     intervention.   CONCLUSION:  Coronary artery disease, status post prior anterior wall  myocardial infarction treated with stenting in the left anterior descending  artery October 2004 with 0% stenosis at the stent site in the proximal left  anterior descending artery, 60% narrowing in the mid left anterior  descending artery, and 90% ostial narrowing of a very small diagonal branch,  no major obstruction of the circumflex artery, 30% narrowing in the proximal  right coronary artery and anterolateral wall and apical wall hypokinesis  with an estimated ejection fraction of 45-50%.   RECOMMENDATIONS:  Will plan continued medical therapy and secondary risk  factor modification.   ADDENDUM:   The right femoral artery was closed with Angioseal at the end of  the procedure.                                               Eustace Quail, M.D. [facility]    BB/MEDQ  D:  08/21/2003  T:  08/21/2003  Job:  [id number]   cc:   Dellis Filbert A. Sherren Mocha, M.D. [facility]   Cardiopulmonary Lab

## 2010-08-08 NOTE — H&P (Signed)
NAMEHALIM, SURRETTE                           ACCOUNT NO.:  [id number]   MEDICAL RECORD NO.:  [id number]                   PATIENT TYPE:  EMS   LOCATION:  MAJO                                 FACILITY:  [facility]   PHYSICIAN:  Christy Sartorius, M.D.                   DATE OF BIRTH:  May 05, 1940   DATE OF ADMISSION:  01/23/2003  DATE OF DISCHARGE:                                HISTORY & PHYSICAL   PRIMARY CARE PHYSICIAN:  Dellis Filbert A. Sherren Mocha, M.D. [facility]   PRIMARY CARDIOLOGIST:  Eustace Quail, M.D.   CHIEF COMPLAINT:  Shortness of breath.   HISTORY OF PRESENT ILLNESS:  Mr. Berdell Hostetler is a 70 year old male with a  history of an anterior myocardial infarction on January 18, 2003 who was  discharged on January 22, 2003.  Last night he had increasing shortness of  breath as well as orthopnea and paroxysmal nocturnal dyspnea.  He denies any  significant edema, palpitations and did not have any chest pain.  He also  had five episodes of diarrhea over night and this morning since taking his  p.m. medications last night.  He did not take any over-the-counter or  prescription medication for this.  Today he took his Lasix and had some  improvement in his shortness of breath but was concerned and came to the  emergency room.  He was unable to lie down without coughing and the cough is  non productive.  He feels like he has difficulty taking a deep breath.  He  does state that his shortness of breath has significantly improved since he  took the Lasix this morning.  He is not short of breath at rest at this time  but states that he is unable to walk very far without shortness of breath.   PAST MEDICAL HISTORY:  1. Status post anterior myocardial infarction on January 18, 2003 with PTCA     and a Taxus stent to the proximal LAD, residual 40% stenosis x2 in the     LAD and times one in the circumflex.  2. Left ventricular dysfunction with an ejection fraction of 40% at     catheterization and apical  akinesis, ejection fraction 35% and possible     LV thrombus by echocardiogram.  3. Anticoagulation started secondary to LV thrombus.  4. Hyperlipidemia.  5. Congestive heart failure.  6. Hypokalemia.  7. Hypertension.   SURGICAL HISTORY:  Cardiac catheterization and arthroscopic surgery to the  right shoulder.   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  He has no history of tobacco use and does not abuse alcohol or  drugs.   FAMILY HISTORY:  His mother died in her 54's, and his father died at age 1  of unknown causes.  He was adopted and there is no other family data  available.   ALLERGIES:  SULFA.   MEDICATIONS AT DISCHARGE:  1. Aspirin 81 mg daily.  2. Plavix 75 mg daily.  3. Nitroglycerin p.r.n.  4. Crestor 40 mg daily.  5. Lopressor 75 b.i.d.  6. Coumadin 5 mg daily.  7. Altace 5 mg daily.  8. K-Dur 20 mEq daily.  9. Lasix 20 mg daily.   REVIEW OF SYSTEMS:  Significant for a non productive cough, shortness of  breath as described above.  He has occasions palpitations and PVCs have been  seen on the monitor.  He has had diarrhea as described above but denies any  nausea, vomiting or reflux symptoms.  Review of systems is otherwise  negative.   PHYSICAL EXAMINATION:  VITAL SIGNS:  Temperature is 98.1, blood pressure is  116/59, heart rate is 82, respiratory rate is 25 and O2 saturation is 94% on  room air.  GENERAL:  He is a well-developed, well-nourished white male in no acute  distress.  HEENT:  Head is normocephalic, atraumatic.  Pupils are equal, round and  reactive to light and accommodation.  Extraocular movements are intact.  Sclerae are clear.  Nares are without discharge.  NECK:  Supple and without lymphadenopathy, thyromegaly, bruit or JVD.  CARDIOVASCULAR:  Regular rate and rhythm S1, S2 and no significant murmur,  rub or gallop.  His distal pulses are 2+ bilaterally.  No femoral bruits are  appreciated.   There is an area of ecchymosis at the cath site but this is  resolving.  SKIN:  No rashes or lesions.  ABDOMEN:  Soft, nontender with active bowel sounds and no hepatosplenomegaly  noted.  EXTREMITIES:  There is trace edema, no cyanosis or clubbing.  MUSCULOSKELETAL:  No joint deformity or effusions and no spine or CVA  Tenderness.  NEUROLOGIC:  He is alert and oriented.  Cranial nerves II-XII grossly  intact.  Strength V/V in all extremities.   Chest x-ray:  Shows bilateral infiltrates in lower lobes with edema less  likely.  EKG:  Sinus rhythm with resolving anterior myocardial infarction  changes.   LABORATORY DATA:  Pending at the time of dictation.   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  He developed  orthopnea and paroxysmal nocturnal dyspnea over night that improved with  Lasix.  We will therefore admit him and make sure that his myocardial  infarction is resolving with serial enzymes.  His Lasix will be increased  b.i.d. and he will be given a dose of IV Lasix times one in the emergency  room.  His potassium will be supplemented and this will be checked.  Magnesium will be checked also.  An echocardiogram will be checked to  reassess his left  ventricular function.  It is felt that the diarrhea is most likely secondary  to Crestor and this will be decreased to his dose of 10 mg daily.  If he  tolerates this he will be discharged on 10 mg daily.  If his diarrhea  continues other medications may be the culprit as he has no other GI  symptoms.      Davis Gourd, P.A. [facility]                  Christy Sartorius, M.D.    RG/MEDQ  D:  01/23/2003  T:  01/23/2003  Job:  [id number]   cc:   Dellis Filbert A. Sherren Mocha, M.D. [facility]

## 2010-09-08 ENCOUNTER — Telehealth: Payer: Self-pay | Admitting: Internal Medicine

## 2010-09-08 NOTE — Telephone Encounter (Signed)
Spoke with pt at length - he states he is noticing a decrease in his stamina, some SOB "gets winded" with walking, bilateral ankle edema in the evenings.  No has had no increase in weight or abdominal girth.  Pt does keep his feet elevated in the evenings (as much as possible) and this helps his edema fairly quickly per his report.  He is watching his NA intake.  Would like to talk to Dr Caryl Comes about his s/s.  Pt was also given the results of his Lexiscan from 2/12.  Pt aware I will forward this information to Dr Caryl Comes for his input.  He will await a call back. He will be at his home number tomorrow 6/19.

## 2010-09-08 NOTE — Telephone Encounter (Signed)
Requesting an appt with klein asap and a call from kelly has questions, wants kelly to call re both

## 2010-09-08 NOTE — Telephone Encounter (Signed)
Called back and pt is in with a patient - he will call back.

## 2010-09-09 NOTE — Telephone Encounter (Signed)
Spoke with pt  He will come in on thurs we will get an echo also steve

## 2010-09-10 ENCOUNTER — Other Ambulatory Visit (HOSPITAL_COMMUNITY): Payer: Self-pay | Admitting: Internal Medicine

## 2010-09-10 ENCOUNTER — Encounter: Payer: Self-pay | Admitting: Internal Medicine

## 2010-09-10 DIAGNOSIS — I4891 Unspecified atrial fibrillation: Secondary | ICD-10-CM

## 2010-09-11 ENCOUNTER — Encounter: Payer: Self-pay | Admitting: Internal Medicine

## 2010-09-11 ENCOUNTER — Ambulatory Visit (HOSPITAL_COMMUNITY): Payer: Medicare Other | Attending: Internal Medicine | Admitting: Radiology

## 2010-09-11 ENCOUNTER — Ambulatory Visit (INDEPENDENT_AMBULATORY_CARE_PROVIDER_SITE_OTHER): Payer: Medicare Other | Admitting: Internal Medicine

## 2010-09-11 VITALS — BP 172/80 | HR 63 | Ht 70.0 in | Wt 169.0 lb

## 2010-09-11 DIAGNOSIS — I4891 Unspecified atrial fibrillation: Secondary | ICD-10-CM

## 2010-09-11 DIAGNOSIS — I079 Rheumatic tricuspid valve disease, unspecified: Secondary | ICD-10-CM | POA: Insufficient documentation

## 2010-09-11 DIAGNOSIS — I080 Rheumatic disorders of both mitral and aortic valves: Secondary | ICD-10-CM | POA: Insufficient documentation

## 2010-09-11 DIAGNOSIS — I379 Nonrheumatic pulmonary valve disorder, unspecified: Secondary | ICD-10-CM | POA: Insufficient documentation

## 2010-09-11 DIAGNOSIS — Z136 Encounter for screening for cardiovascular disorders: Secondary | ICD-10-CM

## 2010-09-11 DIAGNOSIS — I2589 Other forms of chronic ischemic heart disease: Secondary | ICD-10-CM

## 2010-09-11 DIAGNOSIS — I5022 Chronic systolic (congestive) heart failure: Secondary | ICD-10-CM | POA: Insufficient documentation

## 2010-09-11 DIAGNOSIS — I5023 Acute on chronic systolic (congestive) heart failure: Secondary | ICD-10-CM

## 2010-09-11 LAB — BASIC METABOLIC PANEL
CO2: 28 mEq/L (ref 19–32)
Calcium: 9.2 mg/dL (ref 8.4–10.5)
Chloride: 99 mEq/L (ref 96–112)
Glucose, Bld: 97 mg/dL (ref 70–99)
Sodium: 134 mEq/L — ABNORMAL LOW (ref 135–145)

## 2010-09-11 MED ORDER — EPLERENONE 25 MG PO TABS: 25.0000 mg | ORAL_TABLET | Freq: Every day | ORAL | Status: DC

## 2010-09-11 NOTE — Progress Notes (Signed)
  HPI  Caleb [name] is a 70 y.o. male  Seen at his request because of symptoms of progressive exercise intolerance and peripheral edema over the last 3-4 months. Because of this he saw Dr. Joya Gaskins who found him to be in rapid atrial fibrillation; he also had acute bronchitis and saw Richardson Dopp in followup. Given his history of coronary artery disease he was admitted for repeat Myoview scanning which demonstrated an ejection fraction in the 30s and no ischemia.  He has a history of paroxysmal atrial fibrillation and after many years following on Tikosyn which he thinks has been partially successful  He has a history of CAD, s/p Ant MI in 2004 tx'd with stenting to the LAD, ICM, last echo in 06/2009 with EF 30-35%, ant HK, septal and apical AK, mild LVH, mild AI/MR, mild LAE,      He denies a change in salt ingestion.  Past Medical History  Diagnosis Date  . CAD (coronary artery disease)   . Atrial fibrillation   . Cardiomyopathy, ischemic   . Heart failure, systolic, acute on chronic   . Hypertension   . Hyperlipidemia   . Pleural effusion   . Positional vertigo   . Dizziness   . GERD (gastroesophageal reflux disease)   . Atypical pneumonia   . Cough   . Allergic rhinitis   . TIA (transient ischemic attack)   . OSA (obstructive sleep apnea)     Home sleep test 07/05/2009 AHI 8.2    Past Surgical History  Procedure Date  . Coronary stent placement 2004    LAD  . Shoulder surgery     Current Outpatient Prescriptions  Medication Sig Dispense Refill  . dofetilide (TIKOSYN) 500 MCG capsule Take 500 mcg by mouth every 12 (twelve) hours.        . metoprolol succinate (TOPROL-XL) 25 MG 24 hr tablet Take 12.5 mg by mouth daily.        . nitroGLYCERIN (NITROSTAT) 0.4 MG SL tablet Place 0.4 mg under the tongue every 5 (five) minutes as needed. May repeat for up to 3 doses.       . Omega-3 Fatty Acids (THE VERY FINEST FISH OIL) LIQD Take 3 g by mouth daily.        Marland Kitchen PRADAXA 150 MG  CAPS TAKE (1) CAPSULE TWICE   DAILY.  60 capsule  5  . valsartan (DIOVAN) 40 MG tablet Take 40 mg by mouth 2 (two) times daily.          Allergies  Allergen Reactions  . Sulfonamide Derivatives     REACTION: rash    Review of Systems negative except from HPI and PMH  Physical Exam Well developed and well nourished in no acute distress HENT normal E scleral and icterus clear Neck Supple JVP flat With HJR ; carotids brisk and full Clear to ausculation Regular rate and rhythm, no murmurs Or rub; an S4 is present and the PMI is displaced and dyskinetic Soft with active bowel sounds No clubbing cyanosis and edema Alert and oriented, grossly normal motor and sensory function Skin Warm and Dry  ECG Sinus rhythm at 63 Intervals 0.20/0.11 5.45 Axis is -35 Evidence for prior anteroseptal MI  Assessment and  Plan

## 2010-09-11 NOTE — Patient Instructions (Signed)
Your physician recommends that you return for lab work today and in 2 weeks after starting Inspra.  Your physician has recommended you make the following change in your medication: Start Inspra 33m 1 tablet daily.  Return for lab work 2 weeks after beginning medication.  Your physician recommends that you schedule a follow-up appointment in: 3 months with Dr KCaryl Comes

## 2010-09-16 ENCOUNTER — Encounter: Payer: Self-pay | Admitting: *Deleted

## 2010-09-16 ENCOUNTER — Telehealth: Payer: Self-pay | Admitting: *Deleted

## 2010-09-16 NOTE — Telephone Encounter (Signed)
Late entry- The patient called out office on 09/12/10 to speak with [name] about his Inspra. He wanted to know if [name] were starting this for fluid or BP. He stated he had kept his feet elevated on Friday and he had no edema to his lower extremities, which is not typical for him. He also stated that his BP at home was 685 systolic on Thursday evening or Friday morning. He states he typically runs 488-301 systolic. He wanted to know if Inspra was necessary. [name] did call the patient back and speak with him about this. He recommended this for improvement in overall  outcomes for the patient.

## 2010-10-01 ENCOUNTER — Other Ambulatory Visit: Payer: Self-pay | Admitting: *Deleted

## 2010-10-01 DIAGNOSIS — I509 Heart failure, unspecified: Secondary | ICD-10-CM

## 2010-10-01 DIAGNOSIS — Z79899 Other long term (current) drug therapy: Secondary | ICD-10-CM

## 2010-10-03 ENCOUNTER — Other Ambulatory Visit (INDEPENDENT_AMBULATORY_CARE_PROVIDER_SITE_OTHER): Payer: Medicare Other | Admitting: *Deleted

## 2010-10-03 DIAGNOSIS — Z79899 Other long term (current) drug therapy: Secondary | ICD-10-CM

## 2010-10-03 DIAGNOSIS — I509 Heart failure, unspecified: Secondary | ICD-10-CM

## 2010-10-03 DIAGNOSIS — I5023 Acute on chronic systolic (congestive) heart failure: Secondary | ICD-10-CM

## 2010-10-03 LAB — BASIC METABOLIC PANEL
BUN: 12 mg/dL (ref 6–23)
Chloride: 104 mEq/L (ref 96–112)
Creatinine, Ser: 0.8 mg/dL (ref 0.4–1.5)
Glucose, Bld: 105 mg/dL — ABNORMAL HIGH (ref 70–99)
Potassium: 4.6 mEq/L (ref 3.5–5.1)

## 2010-10-08 ENCOUNTER — Other Ambulatory Visit: Payer: Self-pay | Admitting: Internal Medicine

## 2010-10-09 ENCOUNTER — Encounter: Payer: Self-pay | Admitting: Internal Medicine

## 2010-10-13 ENCOUNTER — Encounter: Payer: Self-pay | Admitting: Internal Medicine

## 2010-10-13 ENCOUNTER — Ambulatory Visit (INDEPENDENT_AMBULATORY_CARE_PROVIDER_SITE_OTHER): Payer: Medicare Other | Admitting: Internal Medicine

## 2010-10-13 VITALS — BP 158/60 | HR 58 | Resp 18 | Ht 70.0 in | Wt 167.4 lb

## 2010-10-13 DIAGNOSIS — I5023 Acute on chronic systolic (congestive) heart failure: Secondary | ICD-10-CM

## 2010-10-13 DIAGNOSIS — I10 Essential (primary) hypertension: Secondary | ICD-10-CM

## 2010-10-13 DIAGNOSIS — I495 Sick sinus syndrome: Secondary | ICD-10-CM

## 2010-10-13 DIAGNOSIS — I2510 Atherosclerotic heart disease of native coronary artery without angina pectoris: Secondary | ICD-10-CM

## 2010-10-13 DIAGNOSIS — I4891 Unspecified atrial fibrillation: Secondary | ICD-10-CM

## 2010-10-13 NOTE — Assessment & Plan Note (Signed)
We'll continue him on Tikosyn. He has expressed interest in getting a third set of opinions as well as his atrial fibrillation. I've encouraged him to do so. He is been previously seen at [facility]. I gave him Names from [facility] and [facility].

## 2010-10-13 NOTE — Assessment & Plan Note (Signed)
I am concerned that ischemia might have contributed to his episode of congestive heart failure. As he is reluctant to pursue catheterization, we will undertake empiric ongoing medical therapy.

## 2010-10-13 NOTE — Assessment & Plan Note (Addendum)
Much improved;  I have recommended that we undertake catheterization given the long interlude notwithstanding the negative Myoview. He is reluctant at this time. We'll continue his current medications We discussed possible reasons why the heart may have developed including ischemia, mild subclinical progression, i.e. A straw that broke the camels back, Unbeknownst Salt indiscretion et Ronney Asters. I don't know that we have A clear explanation for this. He might have also had subclinical atrial fibrillation

## 2010-10-13 NOTE — Patient Instructions (Signed)
Your physician recommends that you schedule a follow-up appointment in: 4 months.  Your physician recommends that you continue on your current medications as directed. Please refer to the Current Medication list given to you today.

## 2010-10-13 NOTE — Assessment & Plan Note (Signed)
By his report he Has chronotropic incompetence. He is able to achieve a heart rate of about 80 or 90 with exercise. In the event that we decide about ICD implantation because of progressive or persistent left ventricular dysfunction, dual-chamber pacing would be appropriate. It may also be appropriate to undertake device implantation for symptomatic chronotropic competence anyway,

## 2010-10-13 NOTE — Assessment & Plan Note (Signed)
We will continue him on his current medications. He said his blood pressure home is in the 120 range

## 2010-10-13 NOTE — Progress Notes (Signed)
HPI  Caleb [name] is a 70 y.o. male Seen in followup for paroxysmal fibrillation ischemic heart disease with prior MI and LAD stenting who recently had a episode of congestive heart failure manifested by dyspnea and peripheral edema.  He has done relatively well with diuretics and addition of eplernone.  He denies angina.He is much improved from a heart failure point of view  Most recent Myoview was February 2012 Demonstrating ejection fraction of38 % With Anteroapical hypokinesis Prior anterior (septal apical) MI And felt to be a low-risk study.  Echo June 2012 Also demonstrated an ejection fraction of 30-35% with mild to moderate mitral regurgitation and mild biatrial enlargement  Cath 2005 oronary artery disease, status post prior anterior wall  myocardial infarction treated with stenting in the left anterior descending  artery October 2004 with 0% stenosis at the stent site in the proximal left  anterior descending artery, 60% narrowing in the mid left anterior  descending artery, and 90% ostial narrowing of a very small diagonal branch,  no major obstruction of the circumflex artery, 30% narrowing in the proximal  right coronary artery a   Past Medical History  Diagnosis Date  . CAD (coronary artery disease)   . Atrial fibrillation   . Cardiomyopathy, ischemic   . Heart failure, systolic, acute on chronic   . Hypertension   . Hyperlipidemia   . Pleural effusion   . Positional vertigo   . Dizziness   . GERD (gastroesophageal reflux disease)   . Atypical pneumonia   . Cough   . Allergic rhinitis   . TIA (transient ischemic attack)   . OSA (obstructive sleep apnea)     Home sleep test 07/05/2009 AHI 8.2    Past Surgical History  Procedure Date  . Coronary stent placement 2004    LAD  . Shoulder surgery     Current Outpatient Prescriptions  Medication Sig Dispense Refill  . dofetilide (TIKOSYN) 500 MCG capsule Take 500 mcg by mouth every 12 (twelve) hours.         Marland Kitchen eplerenone (INSPRA) 25 MG tablet Take 1 tablet (25 mg total) by mouth daily.  30 tablet  11  . nitroGLYCERIN (NITROSTAT) 0.4 MG SL tablet Place 0.4 mg under the tongue every 5 (five) minutes as needed. May repeat for up to 3 doses.       . Omega-3 Fatty Acids (THE VERY FINEST FISH OIL) LIQD Take 3 g by mouth daily.        Marland Kitchen PRADAXA 150 MG CAPS TAKE (1) CAPSULE TWICE   DAILY.  60 capsule  5  . TOPROL XL 25 MG 24 hr tablet TAKE 1 TABLET ONCE DAILY.  90 each  3  . valsartan (DIOVAN) 40 MG tablet Take 40 mg by mouth 2 (two) times daily.          Allergies  Allergen Reactions  . Sulfonamide Derivatives     REACTION: rash    Review of Systems negative except from HPI and PMH  Physical Exam Well developed and well nourished in no acute distress HENT normal E scleral and icterus clear Neck Supple JVP flat; carotids brisk and full Clear to ausculation Regular rate and rhythm, no murmurs gallops or rub Soft with active bowel sounds No clubbing cyanosis and edema Alert and oriented, grossly normal motor and sensory function Skin Warm and Dry  ECG Sinus rhythm at 59 Intervals 0.21/0.11/0.47 Axis is -29 Mild ST segment flattening and prior septal infarct  Assessment and  Plan

## 2010-10-30 ENCOUNTER — Telehealth: Payer: Self-pay | Admitting: *Deleted

## 2010-10-30 NOTE — Telephone Encounter (Signed)
Patient called, questioned if he would be able to decrease dose of Pradaxa to 110m.  He states that he has done research and [place] more frequently.  Pt advised that Dr KCaryl Comesnot back in the office until next Wednesday.  Will send note to him and HAlvis Lemmingsfor review next week.

## 2010-11-05 NOTE — Telephone Encounter (Signed)
Spoke with patient and reviewed data from trials that did nt include 86m dose  He will continue on 150 bid

## 2010-11-17 ENCOUNTER — Telehealth: Payer: Self-pay | Admitting: Internal Medicine

## 2010-11-17 NOTE — Telephone Encounter (Signed)
Pt here stating he has appoint today with dr Leighton Roach appoint noted in schedule--pt advised he is not supposed to see dr Stanford Breed until October and pt has appoint slip for that appoint--in meantime he has ? About his medications--we went thru his med list together and figured out what he should and should not be taking and pt agrees--he will f/u as discussed in oct.--nt

## 2010-11-17 NOTE — Telephone Encounter (Signed)
Pt has question re his meds. Pt would like to speak with a nurse. # [phone]

## 2010-11-20 ENCOUNTER — Other Ambulatory Visit: Payer: Self-pay | Admitting: Internal Medicine

## 2010-11-21 MED ORDER — DABIGATRAN ETEXILATE MESYLATE 150 MG PO CAPS: 150.0000 mg | ORAL_CAPSULE | Freq: Two times a day (BID) | ORAL | Status: DC

## 2010-12-12 LAB — APTT: aPTT: 39 — ABNORMAL HIGH

## 2010-12-12 LAB — BASIC METABOLIC PANEL
CO2: 26
Chloride: 106
Glucose, Bld: 121 — ABNORMAL HIGH
Potassium: 4.7
Sodium: 140

## 2010-12-12 LAB — CBC
HCT: 49.1
Hemoglobin: 16.2
MCHC: 33
MCV: 90
RDW: 14.9

## 2010-12-12 LAB — PROTIME-INR: INR: 2.6 — ABNORMAL HIGH

## 2010-12-15 LAB — PROTIME-INR: INR: 2.6 — ABNORMAL HIGH

## 2010-12-23 ENCOUNTER — Telehealth: Payer: Self-pay | Admitting: Critical Care Medicine

## 2010-12-23 NOTE — Telephone Encounter (Signed)
Called, spoke with [name].  States he is coughing some with yellow phlegm and feels like he has a cold x 2-4 days.  States he has not had a fever, he feels like it is mostly in his throat, not chest at this time.  He also has a few concerns regarding the flu shot and an airplane trip he plans to take in the next few wks he would like to speak with Dr. Joya Gaskins about.  Dr. Joya Gaskins has an opening tomorrow - I offered this vs. Speaking with PW on the phone regarding this.  He would like to come in -- OV scheduled 12/24/10 at 3:45 -- [name] aware.  Nothing further needed at this time.

## 2010-12-24 ENCOUNTER — Ambulatory Visit (INDEPENDENT_AMBULATORY_CARE_PROVIDER_SITE_OTHER): Payer: Medicare Other | Admitting: Critical Care Medicine

## 2010-12-24 ENCOUNTER — Other Ambulatory Visit: Payer: Self-pay | Admitting: Internal Medicine

## 2010-12-24 ENCOUNTER — Encounter: Payer: Self-pay | Admitting: Critical Care Medicine

## 2010-12-24 VITALS — BP 144/66 | HR 63 | Temp 98.4°F | Ht 70.0 in | Wt 171.6 lb

## 2010-12-24 DIAGNOSIS — J309 Allergic rhinitis, unspecified: Secondary | ICD-10-CM

## 2010-12-24 MED ORDER — FLUTICASONE FUROATE 27.5 MCG/SPRAY NA SUSP: 2.0000 | Freq: Every day | NASAL | Status: DC

## 2010-12-24 NOTE — Patient Instructions (Signed)
Trial Veramyst two sprays each nostril daily No other changes Return as needed

## 2010-12-24 NOTE — Progress Notes (Signed)
Subjective:    Patient ID: Caleb [name], male    DOB: [birth year], 70 y.o.   MRN: [id number]  HPI 70 y.o.WM URI two weeks ago,  ? At first was the meds?  Felt tired and stuffy and cough.  Had PNA in jan 2012. Sl yellow mucus.  Notes clear mucus out of nose, throat is not sore. No chest pain.  Notes edema in feet.    Past Medical History  Diagnosis Date  . CAD (coronary artery disease)   . Atrial fibrillation   . Cardiomyopathy, ischemic   . Heart failure, systolic, acute on chronic   . Hypertension   . Hyperlipidemia   . Pleural effusion   . Positional vertigo   . Dizziness   . GERD (gastroesophageal reflux disease)   . Atypical pneumonia   . Cough   . Allergic rhinitis   . TIA (transient ischemic attack)   . OSA (obstructive sleep apnea)     Home sleep test 07/05/2009 AHI 8.2     Family History  Problem Relation Age of Onset  . Adopted: Yes     History   Social History  . Marital Status: Married    Spouse Name: N/A    Number of Children: N/A  . Years of Education: N/A   Occupational History  . PHYSICIAN     Psychologist   Social History Main Topics  . Smoking status: Never Smoker   . Smokeless tobacco: Never Used  . Alcohol Use: Yes     Socially  . Drug Use: No  . Sexually Active: Not on file   Other Topics Concern  . Not on file   Social History Narrative   MarriedGets regular exercise     Allergies  Allergen Reactions  . Sulfonamide Derivatives     REACTION: rash     Outpatient Prescriptions Prior to Visit  Medication Sig Dispense Refill  . dabigatran (PRADAXA) 150 MG CAPS Take 1 capsule (150 mg total) by mouth every 12 (twelve) hours.  60 capsule  5  . eplerenone (INSPRA) 25 MG tablet Take 1 tablet (25 mg total) by mouth daily.  30 tablet  11  . nitroGLYCERIN (NITROSTAT) 0.4 MG SL tablet Place 0.4 mg under the tongue every 5 (five) minutes as needed. May repeat for up to 3 doses.       . Omega-3 Fatty Acids (THE VERY FINEST FISH OIL) LIQD  Take 2-3 g by mouth daily.       . TOPROL XL 25 MG 24 hr tablet TAKE 1 TABLET ONCE DAILY.  90 each  3  . valsartan (DIOVAN) 40 MG tablet Take 40 mg by mouth 2 (two) times daily.           Review of Systems Constitutional:   No  weight loss, night sweats,  Fevers, chills, fatigue, lassitude. HEENT:   No headaches,  Difficulty swallowing,  Tooth/dental problems,  Sore throat,                No sneezing, itching, ear ache, nasal congestion, post nasal drip,   CV:  No chest pain,  Orthopnea, PND, swelling in lower extremities, anasarca, dizziness, palpitations  GI  No heartburn, indigestion, abdominal pain, nausea, vomiting, diarrhea, change in bowel habits, loss of appetite  Resp: No shortness of breath with exertion or at rest.  No excess mucus, no productive cough,  No non-productive cough,  No coughing up of blood.  No change in color of mucus.  No wheezing.  No chest wall deformity  Skin: no rash or lesions.  GU: no dysuria, change in color of urine, no urgency or frequency.  No flank pain.  MS:  No joint pain or swelling.  No decreased range of motion.  No back pain.  Psych:  No change in mood or affect. No depression or anxiety.  No memory loss.     Objective:   Physical Exam Filed Vitals:   12/24/10 1546  BP: 144/66  Pulse: 63  Temp: 98.4 F (36.9 C)  TempSrc: Oral  Height: _0  (1.778 m)  Weight: 171 lb 9.6 oz (77.837 kg)  SpO2: 96%    Gen: Pleasant, well-nourished, in no distress,  normal affect  ENT: No lesions,  mouth clear,  oropharynx clear, + postnasal drip with nasal erythema  Neck: No JVD, no TMG, no carotid bruits  Lungs: No use of accessory muscles, no dullness to percussion, clear without rales or rhonchi  Cardiovascular: RRR, heart sounds normal, no murmur or gallops, no peripheral edema  Abdomen: soft and NT, no HSM,  BS normal  Musculoskeletal: No deformities, no cyanosis or clubbing  Neuro: alert, non focal  Skin: Warm, no lesions or  rashes        Assessment & Plan:   ALLERGIC RHINITIS Probable Viral URI and allergic rhinitis flare, doubt true sinusitis No lower airway issues, no PNA on exam Plan Trial veramyst, samples given    Updated Medication List Outpatient Encounter Prescriptions as of 12/24/2010  Medication Sig Dispense Refill  . dabigatran (PRADAXA) 150 MG CAPS Take 1 capsule (150 mg total) by mouth every 12 (twelve) hours.  60 capsule  5  . eplerenone (INSPRA) 25 MG tablet Take 1 tablet (25 mg total) by mouth daily.  30 tablet  11  . nitroGLYCERIN (NITROSTAT) 0.4 MG SL tablet Place 0.4 mg under the tongue every 5 (five) minutes as needed. May repeat for up to 3 doses.       . Omega-3 Fatty Acids (THE VERY FINEST FISH OIL) LIQD Take 2-3 g by mouth daily.       Marland Kitchen TIKOSYN 500 MCG capsule TAKE 1 CAPSULE EVERY 12   HOURS  120 capsule  1  . TOPROL XL 25 MG 24 hr tablet TAKE 1 TABLET ONCE DAILY.  90 each  3  . valsartan (DIOVAN) 40 MG tablet Take 40 mg by mouth 2 (two) times daily.        . fluticasone (VERAMYST) 27.5 MCG/SPRAY nasal spray Place 2 sprays into the nose daily.  10 g  1

## 2010-12-25 ENCOUNTER — Other Ambulatory Visit: Payer: Self-pay | Admitting: *Deleted

## 2010-12-25 MED ORDER — VALSARTAN 40 MG PO TABS: 40.0000 mg | ORAL_TABLET | Freq: Two times a day (BID) | ORAL | Status: DC

## 2010-12-25 NOTE — Assessment & Plan Note (Addendum)
Probable Viral URI and allergic rhinitis flare, doubt true sinusitis No lower airway issues, no PNA on exam Plan Trial veramyst, samples given

## 2011-01-26 ENCOUNTER — Ambulatory Visit (INDEPENDENT_AMBULATORY_CARE_PROVIDER_SITE_OTHER): Payer: Medicare Other | Admitting: Internal Medicine

## 2011-01-26 ENCOUNTER — Encounter: Payer: Self-pay | Admitting: Internal Medicine

## 2011-01-26 VITALS — BP 146/74 | HR 52 | Resp 18 | Ht 70.0 in | Wt 167.0 lb

## 2011-01-26 DIAGNOSIS — I4891 Unspecified atrial fibrillation: Secondary | ICD-10-CM

## 2011-01-26 DIAGNOSIS — I2589 Other forms of chronic ischemic heart disease: Secondary | ICD-10-CM

## 2011-01-26 DIAGNOSIS — F411 Generalized anxiety disorder: Secondary | ICD-10-CM

## 2011-01-26 DIAGNOSIS — I10 Essential (primary) hypertension: Secondary | ICD-10-CM

## 2011-01-26 DIAGNOSIS — F419 Anxiety disorder, unspecified: Secondary | ICD-10-CM | POA: Insufficient documentation

## 2011-01-26 DIAGNOSIS — I5022 Chronic systolic (congestive) heart failure: Secondary | ICD-10-CM

## 2011-01-26 LAB — BASIC METABOLIC PANEL
Calcium: 9.4 mg/dL (ref 8.4–10.5)
GFR: 95.4 mL/min (ref >60.00)
Potassium: 4.6 mEq/L (ref 3.5–5.1)
Sodium: 139 mEq/L (ref 135–145)

## 2011-01-26 LAB — MAGNESIUM: Magnesium: 2.1 mg/dL (ref 1.5–2.5)

## 2011-01-26 NOTE — Assessment & Plan Note (Signed)
Well controlled 

## 2011-01-26 NOTE — Assessment & Plan Note (Signed)
Has a huge impact on all decision making

## 2011-01-26 NOTE — Assessment & Plan Note (Signed)
Stable on current medications. Ejection fraction remains depressed. We again reviewed the role of a defibrillator. He is not inclined currently. We also discussed the role of Dilantin therapy all of which he is taking to help prevent Adverse remodeling

## 2011-01-26 NOTE — Progress Notes (Signed)
  HPI  Caleb [name] is a 70 y.o. male Seen in followup for paroxysmal fibrillation ischemic heart disease with prior MI and LAD stenting  Some sob with exertion.  No significant fluid.      Most recent Myoview was February 2012 Demonstrating ejection fraction of38 % With Anteroapical hypokinesis Prior anterior (septal apical) MI And felt to be a low-risk study.  Echo June 2012 Also demonstrated an ejection fraction of 30-35% with mild to moderate mitral regurgitation and mild biatrial enlargement  Cath 2005 oronary artery disease, status post prior anterior wall  myocardial infarction treated with stenting in the left anterior descending  artery October 2004 with 0% stenosis at the stent site in the proximal left  anterior descending artery, 60% narrowing in the mid left anterior  descending artery, and 90% ostial narrowing of a very small diagonal branch,  no major obstruction of the circumflex artery, 30% narrowing in the proximal right coronary artery.  Past Medical History  Diagnosis Date  . CAD (coronary artery disease)   . Atrial fibrillation   . Cardiomyopathy, ischemic   . Heart failure, systolic, acute on chronic   . Hypertension   . Hyperlipidemia   . Pleural effusion   . Positional vertigo   . Dizziness   . GERD (gastroesophageal reflux disease)   . Atypical pneumonia   . Cough   . Allergic rhinitis   . TIA (transient ischemic attack)   . OSA (obstructive sleep apnea)     Home sleep test 07/05/2009 AHI 8.2    Past Surgical History  Procedure Date  . Coronary stent placement 2004    LAD  . Shoulder surgery     Current Outpatient Prescriptions  Medication Sig Dispense Refill  . dofetilide (TIKOSYN) 500 MCG capsule Take 500 mcg by mouth every 12 (twelve) hours.        Marland Kitchen eplerenone (INSPRA) 25 MG tablet Take 1 tablet (25 mg total) by mouth daily.  30 tablet  11  . nitroGLYCERIN (NITROSTAT) 0.4 MG SL tablet Place 0.4 mg under the tongue every 5 (five)  minutes as needed. May repeat for up to 3 doses.       . Omega-3 Fatty Acids (THE VERY FINEST FISH OIL) LIQD Take 3 g by mouth daily.        Marland Kitchen PRADAXA 150 MG CAPS TAKE (1) CAPSULE TWICE   DAILY.  60 capsule  5  . TOPROL XL 25 MG 24 hr tablet TAKE 1 TABLET ONCE DAILY.  90 each  3  . valsartan (DIOVAN) 40 MG tablet Take 40 mg by mouth 2 (two) times daily.          Allergies  Allergen Reactions  . Sulfonamide Derivatives     REACTION: rash    Review of Systems negative except from HPI and PMH  Physical Exam Well developed and well nourished in no acute distress HENT normal E scleral and icterus clear Neck Supple JVP flat; carotids brisk and full Clear to ausculation Regular rate and rhythm, no murmurs gallops or rub Soft with active bowel sounds No clubbing cyanosis and edema Alert and oriented, grossly normal motor and sensory function Skin Warm and Dry  ECG Sinus rhythm at 63 Intervals 0.20/0.11/0.47 Axis is -29 Mild ST segment flattening and prior septal infarct ucnhanged 7/12  Assessment and  Plan

## 2011-01-26 NOTE — Assessment & Plan Note (Signed)
Is having few  symptomatic paroxysms of atrial fibrillation. We will continue his Tikosyn. QT Interval is okay. We will check metabolic profile and magnesium. We discussed again catheter ablation and its role for symptomatic relief. We also discussed Pradaxa.

## 2011-01-26 NOTE — Patient Instructions (Addendum)
Your physician recommends that you have lab work today: bmp/magnesium  Your physician wants you to follow-up in: 6 months with [name]. You will receive a reminder letter in the mail two months in advance. If you don't receive a letter, please call our office to schedule the follow-up appointment.   Marland Kitchen

## 2011-01-26 NOTE — Assessment & Plan Note (Signed)
Stable on current medications

## 2011-02-16 ENCOUNTER — Telehealth: Payer: Self-pay | Admitting: Internal Medicine

## 2011-02-16 NOTE — Telephone Encounter (Addendum)
Pt requested call from kelly, per kelly this is dr Olin Pia pt and needs to be sent to triage due to heather being out

## 2011-02-16 NOTE — Telephone Encounter (Signed)
Pt had a blood vessel "rupture" in his thumb a few weeks ago.  It is still "puffy" and he has been experiencing pain with movement in the thumb.  He was wondering about the Pradaxa. We discussed the side effects of Pradaxa. Pt is quite knowledgeable regarding his medications. He also reports some pain intermittently in the left elbow.  He is seeing his orthopaedist today ( Dr. Micheline Chapman) and will have him examine his thumb as well.  Pt says he is available for contact by [name] if he needs to call him. I will forward this message to [name]. Horton Chin RN

## 2011-04-02 DIAGNOSIS — M19049 Primary osteoarthritis, unspecified hand: Secondary | ICD-10-CM | POA: Diagnosis not present

## 2011-04-26 ENCOUNTER — Emergency Department (INDEPENDENT_AMBULATORY_CARE_PROVIDER_SITE_OTHER)
Admission: EM | Admit: 2011-04-26 | Discharge: 2011-04-26 | Disposition: A | Discharge status: Home / Self Care | Payer: Medicare Other | Source: Home / Self Care | Attending: Family Medicine | Admitting: Family Medicine

## 2011-04-26 ENCOUNTER — Telehealth: Payer: Self-pay | Admitting: Physician Assistant

## 2011-04-26 ENCOUNTER — Encounter (HOSPITAL_COMMUNITY): Payer: Self-pay

## 2011-04-26 DIAGNOSIS — I776 Arteritis, unspecified: Secondary | ICD-10-CM | POA: Diagnosis not present

## 2011-04-26 NOTE — ED Notes (Signed)
Pt has rash on lower both shins, worse on rt for 3 days.

## 2011-04-26 NOTE — Telephone Encounter (Addendum)
Also received phone message from patient regarding above today Feb 3rd   Disc this and because of no systemic sx or bleeding would not stop the pradaxa but be seen tomorrow  Dr Sherren Mocha  Who is very experienced in rashes and cards as appropriate.  Unclear if from pradaxa  Or other issue.  WKpanosh MD Primary care on call  (Am attaching this as an addendum because call is related to first call to cardiology. )

## 2011-04-26 NOTE — ED Provider Notes (Signed)
History     CSN: [id number]  Arrival date & time 04/26/11  1659   First MD Initiated Contact with Patient 04/26/11 1722      Chief Complaint  Patient presents with  . 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 Reports onset of symptoms 3 days ago. Reports worsening symptoms. Since that point. He reports itching sensation with burning and some tenderness. He states that hot water also causes stinging sensation. He denies any new medications or exposures. There's been no change in activity level. He is taking Pradaxa, but this is the last 2 years. He is also taking a potassium sparing diuretic. Over the last 6 months. There've been no other new medications. His history of atrial fibrillation. And a heart attack 8 years ago. He tried some hydrocortisone topically with little relief. Last night. He denies any fever. He also denies any spontaneous bleeding. No mucosal bleeding. He does point out a small burns over his left forearm. The rash on his lower legs is bilateral. The right is worse than the left. The area is warm, mildly erythematous and tender. The erythema does blanch.  Patient is a 71 y.o. male presenting with rash. The history is provided by the patient.  Rash  This is a new problem. The problem has not changed since onset.The problem is associated with nothing. There has been no fever. The rash is present on the left lower leg and right lower leg. The pain is mild. The pain has been constant since onset. Associated symptoms include itching and pain. He has tried steriods for the symptoms.    Past Medical History  Diagnosis Date  . CAD (coronary artery disease)   . Atrial fibrillation   . Cardiomyopathy, ischemic   . Heart failure, systolic, acute on chronic   . Hypertension   . Hyperlipidemia   . Pleural effusion   . Positional  vertigo   . Dizziness   . GERD (gastroesophageal reflux disease)   . Atypical pneumonia   . Cough   . Allergic rhinitis   . TIA (transient ischemic attack)   . OSA (obstructive sleep apnea)     Home sleep test 07/05/2009 AHI 8.2    Past Surgical History  Procedure Date  . Coronary stent placement 2004    LAD  . Shoulder surgery     Family History  Problem Relation Age of Onset  . Adopted: Yes    History  Substance Use Topics  . Smoking status: Never Smoker   . Smokeless tobacco: Never Used  . Alcohol Use: Yes     Socially      Review of Systems  Constitutional: Negative.   HENT: Negative.   Eyes: Negative.   Respiratory: Negative.   Cardiovascular: Negative.   Gastrointestinal: Negative.   Genitourinary: Negative.   Musculoskeletal: Negative.   Skin: Positive for itching and rash.  Neurological: Negative.     Allergies  Sulfonamide derivatives  Home Medications   Current Outpatient Rx  Name Route Sig Dispense Refill  . DABIGATRAN ETEXILATE MESYLATE 150 MG PO CAPS Oral Take 1 capsule (150 mg total) by mouth every 12 (twelve) hours. 60 capsule 5  . EPLERENONE 25 MG PO TABS Oral Take 1 tablet (25 mg total) by mouth daily. 30 tablet 11  . FLUTICASONE FUROATE 27.5 MCG/SPRAY NA SUSP Nasal Place 2 sprays into the nose daily. 10 g 1  . NITROGLYCERIN  0.4 MG SL SUBL Sublingual Place 0.4 mg under the tongue every 5 (five) minutes as needed. May repeat for up to 3 doses.     . THE VERY FINEST FISH OIL PO LIQD Oral Take 2-3 g by mouth daily.     Marland Kitchen TIKOSYN 500 MCG PO CAPS  TAKE 1 CAPSULE EVERY 12   HOURS 120 capsule 1  . TOPROL XL 25 MG PO TB24  TAKE 1 TABLET ONCE DAILY. 90 each 3  . VALSARTAN 40 MG PO TABS Oral Take 1 tablet (40 mg total) by mouth 2 (two) times daily. 180 tablet 3    BP 168/80  Pulse 64  Temp(Src) 98.1 F (36.7 C) (Oral)  Resp 19  SpO2 97%  Physical Exam  Nursing note and vitals reviewed. Constitutional: He is oriented to person, place, and  time. He appears well-developed and well-nourished.  HENT:  Head: Normocephalic and atraumatic.  Eyes: EOM are normal.  Neck: Normal range of motion.  Pulmonary/Chest: Effort normal.  Musculoskeletal: Normal range of motion.  Neurological: He is alert and oriented to person, place, and time.  Skin: Skin is warm and dry. There is erythema.          Right lower leg: There is an area of approximately 6 cm x 6 cm of mild erythema superficial scratches and areas of visible blood beneath the skin that blanches. It is mildly warm to touch. It is mildly tender. There is no obvious bruising. There is mild petechiae.  Psychiatric: His behavior is normal.    ED Course  Procedures (including critical care time)  Labs Reviewed - No data to display No results found.   1. Vasculitis       MDM  Unclear cause for skin changes over right lower leg. Based on examination, there is concern for vasculitis. This might be secondary to medications. Specifically, the anticoagulants. He will followup with his primary care doctor, as well as cardiologist, tomorrow. Precautions given to return to care tonight should he experience any spontaneous bleeding.        Marcell Anger, MD 04/26/11 2014

## 2011-04-26 NOTE — Telephone Encounter (Signed)
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 It was described as pruritic, well-demarcated, and spreading slowly. It was not weeping. After the first conversation, he tried OTC steroid cream and got some relief. There was no edema, and no SOB.  [name] was very concerned about the rash being from Pradaxa and felt that he had similar symptoms several years ago while on coumadin. He has been on Pradaxa for months and there has been no new medical problems and no recent ABX or other temporary meds. The last medication that was started was eplerenone.  When his symptoms did not improve much overnight, he called back. I spoke with Dr Haroldine Laws who felt that the rash was not typical of a drug reaction. He felt the symptoms were more likely from a localized source such as a bug bite or contact reaction. It is recommended that the patient continue all his usual meds and it is OK to use OTC steroid or Benadryl creams to control symptoms. He is encouraged to follow up with primary care for further evaluation and seek more urgent help if symptoms worsen or he develps SOB/edema. The patient is aware of the recommendations and will also be contacted by cardiology for follow-up.

## 2011-04-27 ENCOUNTER — Encounter: Payer: Self-pay | Admitting: Physician Assistant

## 2011-04-27 ENCOUNTER — Ambulatory Visit (INDEPENDENT_AMBULATORY_CARE_PROVIDER_SITE_OTHER): Payer: Medicare Other | Admitting: Physician Assistant

## 2011-04-27 ENCOUNTER — Telehealth: Payer: Self-pay | Admitting: Internal Medicine

## 2011-04-27 VITALS — BP 130/70 | HR 73 | Ht 70.0 in | Wt 164.0 lb

## 2011-04-27 DIAGNOSIS — I4891 Unspecified atrial fibrillation: Secondary | ICD-10-CM | POA: Diagnosis not present

## 2011-04-27 DIAGNOSIS — R21 Rash and other nonspecific skin eruption: Secondary | ICD-10-CM

## 2011-04-27 DIAGNOSIS — I5022 Chronic systolic (congestive) heart failure: Secondary | ICD-10-CM

## 2011-04-27 NOTE — Assessment & Plan Note (Signed)
See above.  Continue Pradaxa for now.

## 2011-04-27 NOTE — Telephone Encounter (Signed)
Have attempted to return pt's call numerous times.  Home number, [phone] continues to be busy.

## 2011-04-27 NOTE — Telephone Encounter (Signed)
Appt made to see Richardson Dopp today 2:30pm.  Pt aware.

## 2011-04-27 NOTE — Progress Notes (Signed)
[place], [place]  [address] Phone: [phone] Fax:  [phone]  Date:  04/27/2011   Name:  Caleb [name]       DOB:  1940-09-07 MRN:  [id number]  PCP:  Dr. Sherren Mocha Primary Cardiologist:  Dr. Virl Axe  Primary Electrophysiologist:  Dr. 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  He has a h/o parox. AFib, on Tikosyn and coumadin, CAD, s/p Ant MI in 2004 tx'd with stenting to the LAD, ICM, last echo in 06/2009 with EF 30-35%, ant HK, septal and apical AK, mild LVH, mild AI/MR, mild LAE, chronic systolic CHF, HTN and hyperlipidemia.    He developed a rash on his RLE 2 weeks ago.  He called in to the answering service several times and was actually seen at Urgent Care once.  He is concerned it is from Pradaxa.  He has taken Pradaxa for over 2 years.  No other new medications.  The rash is localized.  Has noted som discoloration on the LLE as well.  It is pruritic.  No fevers, chills, arthralgias.  Has been using OTC hydrocortisone cream.  It is better today.  Notes some scant ecchymoses on his LUE.    Past Medical History  Diagnosis Date  . CAD (coronary artery disease)   . Atrial fibrillation   . Cardiomyopathy, ischemic   . Heart failure, systolic, acute on chronic   . Hypertension   . Hyperlipidemia   . Pleural effusion   . Positional vertigo   . Dizziness   . GERD (gastroesophageal reflux disease)   . Atypical pneumonia   . Cough   . Allergic rhinitis   . TIA (transient ischemic attack)   . OSA (obstructive sleep apnea)     Home sleep test 07/05/2009 AHI 8.2    Current Outpatient Prescriptions  Medication Sig Dispense Refill  . dabigatran (PRADAXA) 150 MG CAPS Take 1 capsule (150 mg total) by mouth every 12 (twelve) hours.  60 capsule  5  . eplerenone (INSPRA) 25 MG tablet Take 1 tablet (25 mg total) by mouth daily.  30 tablet  11  . nitroGLYCERIN (NITROSTAT) 0.4 MG SL  tablet Place 0.4 mg under the tongue every 5 (five) minutes as needed. May repeat for up to 3 doses.       . Omega-3 Fatty Acids (THE VERY FINEST FISH OIL) LIQD Take 2-3 g by mouth daily.       Marland Kitchen TIKOSYN 500 MCG capsule TAKE 1 CAPSULE EVERY 12   HOURS  120 capsule  1  . TOPROL XL 25 MG 24 hr tablet TAKE 1 TABLET ONCE DAILY.  90 each  3  . valsartan (DIOVAN) 40 MG tablet Take 1 tablet (40 mg total) by mouth 2 (two) times daily.  180 tablet  3    Allergies: Allergies  Allergen Reactions  . Sulfonamide Derivatives     REACTION: rash    History  Substance Use Topics  . Smoking status: Never Smoker   . Smokeless tobacco: Never Used  . Alcohol Use: Yes     Socially     ROS:  Please see the history of present illness.   No melena, hematochezia, significant cough, vomiting or diarrhea.  All other systems reviewed and negative.   PHYSICAL EXAM: VS:  BP 130/70  Pulse 73  Ht _0  (1.778 m)  Wt 164 lb (74.39  kg)  BMI 23.53 kg/m2 Well nourished, well developed, in no acute distress HEENT: normal Neck: no JVD at 90 degrees Cardiac:  normal S1, S2; RRR; no murmur Lungs:  clear to auscultation bilaterally, no wheezing, rhonchi or rales Abd: soft, nontender, no hepatomegaly Ext: no edema Skin: warm and dry; mod sized area of darkened pigment on the right tibial area with what appears to be scattered excoriation; LLE with darkened pigment that appears similar to chronic stasis changes Neuro:  CNs 2-12 intact, no focal abnormalities noted  EKG:  NSR, HR 73, LAD, IVCD, PRWP, no change from prior  ASSESSMENT AND PLAN:

## 2011-04-27 NOTE — Patient Instructions (Addendum)
Your physician recommends that you schedule a follow-up appointment in: [place] physician recommends that you return for lab work in: TODAY BMET, CBC W/DIFF 782.1 [place] have been referred to [facility]. Warren Lacy [place] [phone] @ 04/29/11 @ 3:40   KEEP USING HYDROCORTISONE CREAM TWICE DAILY UNTIL APPT WITH DERMATOLOGY

## 2011-04-27 NOTE — Assessment & Plan Note (Addendum)
This does not appear to be a drug reaction.  Changes look suspicious for chronic stasis changes with xerosis and excoriation.  He is quite concerned about Pradaxa.  But has taken this for years.  We discussed how the dose should remain at 150 mg bid if his renal fxn is normal.  We discussed how stroke prevention and bleeding complications have a better profile with the novel anticoagulants rather than coumadin.  Check a CBC with diff and bmet today.  Refer to dermatology (as he is quite concerned).  Continue OTC hydrocortisone cream bid for now.  Of note, Dr. Loralie Champagne also saw the patient today (at [facility] request).

## 2011-04-27 NOTE — Assessment & Plan Note (Signed)
Volume stable.  Follow up with Dr. Virl Axe as directed.

## 2011-04-27 NOTE — Telephone Encounter (Signed)
Pt calling re reaction to pradaxa and wants an appt today, [phone], pt having breaking out of legs, inflammed ,red and itching

## 2011-04-28 LAB — CBC WITH DIFFERENTIAL/PLATELET
Basophils Relative: 1.3 % (ref 0.0–3.0)
Eosinophils Relative: 5.4 % — ABNORMAL HIGH (ref 0.0–5.0)
HCT: 45.3 % (ref 39.0–52.0)
Hemoglobin: 15.4 g/dL (ref 13.0–17.0)
Lymphs Abs: 1.7 10*3/uL (ref 0.7–4.0)
MCV: 91 fl (ref 78.0–100.0)
Monocytes Relative: 8.3 % (ref 3.0–12.0)
Neutro Abs: 4 10*3/uL (ref 1.4–7.7)
Platelets: 230 10*3/uL (ref 150.0–400.0)
RBC: 4.98 Mil/uL (ref 4.22–5.81)
WBC: 6.7 10*3/uL (ref 4.5–10.5)

## 2011-04-28 LAB — BASIC METABOLIC PANEL
Chloride: 101 mEq/L (ref 96–112)
GFR: 84.77 mL/min (ref >60.00)
Potassium: 4.3 mEq/L (ref 3.5–5.1)
Sodium: 137 mEq/L (ref 135–145)

## 2011-04-29 DIAGNOSIS — L299 Pruritus, unspecified: Secondary | ICD-10-CM | POA: Diagnosis not present

## 2011-04-29 DIAGNOSIS — L259 Unspecified contact dermatitis, unspecified cause: Secondary | ICD-10-CM | POA: Diagnosis not present

## 2011-05-11 ENCOUNTER — Telehealth: Payer: Self-pay | Admitting: Internal Medicine

## 2011-05-11 NOTE — Telephone Encounter (Signed)
New Problem   Patient would like a return call at Home: [phone] regarding rash on both legs, and he thinks it may be from RX Pradaxa.

## 2011-05-11 NOTE — Telephone Encounter (Signed)
Patient states has been having a rash in his legs, the last time he was seen by Richardson Dopp PA on 04/27/11. PT  was referred THEN  to a dermatologist for the rash.   The Dermatologist  prescribed a cream that it did not help. He would like to make an appointment with Dr Caryl Comes soon because he said [name] needs to see this rash, which is  bluish underneath, pt   thinks it is caused by Pradaxa. Also pt has other issues to discussed with [name]. Patient has an appointment on 08/06/11 he would like to be seen sooner. Patient would like for  Alvis Lemmings to call him for an appointment tomorrow. Pt.  can't make it on Tuesdays or next Wednesday, he is aware that Md may need to be overbooked.

## 2011-05-12 NOTE — Telephone Encounter (Signed)
I asked [name] about this. He wanted the patient's number to call him at home. The patient's home & work numbers were texted to [name].

## 2011-05-13 NOTE — Telephone Encounter (Signed)
Per [name], he called the patient yesterday and spoke with him. He is going to stop Inspra since this is his newest medication. If he notices no change in his syptoms, he will stop pradaxa and start another blood thinner.

## 2011-05-14 ENCOUNTER — Other Ambulatory Visit: Payer: Self-pay | Admitting: Internal Medicine

## 2011-05-15 ENCOUNTER — Other Ambulatory Visit: Payer: Self-pay

## 2011-05-15 MED ORDER — DABIGATRAN ETEXILATE MESYLATE 150 MG PO CAPS: 150.0000 mg | ORAL_CAPSULE | Freq: Two times a day (BID) | ORAL | Status: DC

## 2011-05-21 ENCOUNTER — Telehealth: Payer: Self-pay | Admitting: Internal Medicine

## 2011-05-21 MED ORDER — APIXABAN 5 MG PO TABS: 5.0000 mg | ORAL_TABLET | Freq: Two times a day (BID) | ORAL | Status: DC

## 2011-05-21 NOTE — Telephone Encounter (Signed)
Patient would like to be seen as soon as possible, thinks he may be having a reaction to pradaxa meds.  Please return call to patient [phone].

## 2011-05-21 NOTE — Telephone Encounter (Signed)
I spoke with the patient. He states he has been off of his Inspra and he continues to have bluish discoloration to his lower extremities. Today being worse than yesterday. I have explained that we can switch his drug, but have also asked him did he want to see [name] first, or would he be ok switching over the phone. I have explained that if he took his Pradaxa this morning, we could replace his next dose with an alternate drug (xarelto/ eliquis). He is concerned about having too much drug on board. I explained to him that the Pradaxa would be clearing his system as the new drug is coming on board. We do not want him off therapy completely due to his history of TIA. He voices understanding. He would like to talk to our pharmacist, Porfirio Oar, regarding the options for treatment. I have explained I will ask her to call him and then we can start his new drug. He is agreeable with this. Alvis Lemmings, RN, BSN  Spoke with Gay Filler. She will call the patient and let me know what he decides. Alvis Lemmings, RN, BSN

## 2011-05-21 NOTE — Telephone Encounter (Signed)
Spoke with pt.  Discussed skin discoloration.  Pt says it has turned blue above his knee and below.  It does not itch as much anymore.  No pain associated with the discoloration.  He did see dermatology last week and they were unsure what it was.  ? Bruising from anticoagulation therapy.  Pt says it does resemble a bruise but hard to tell without being able to see pt.  We discuss other anticoagulation options.  He is aware bleeding risk is relatively similar in all of the agents.  He would like to try apixaban at this time.  He has checked with his insurance company and started an appeal process to get it approved.  He would like to try a lower dose but explained to pt that he should be a full treatment dose given his history of TIA and the lack of monitoring available with the new agents.  He agreed to try to higher dose. Will discuss with [name].

## 2011-05-21 NOTE — Telephone Encounter (Signed)
Per [name], okay to start apixaban.  Will dose at 40m BID.  Pt is aware.  He will stop Pradaxa then take first dose of apixaban when next dose of Pradaxa is due.  He will come by the office to pick up 30 day free card.

## 2011-05-22 ENCOUNTER — Other Ambulatory Visit: Payer: Self-pay | Admitting: *Deleted

## 2011-05-22 DIAGNOSIS — I4891 Unspecified atrial fibrillation: Secondary | ICD-10-CM

## 2011-05-25 ENCOUNTER — Ambulatory Visit (HOSPITAL_COMMUNITY): Payer: Medicare Other | Attending: Internal Medicine

## 2011-05-25 DIAGNOSIS — I10 Essential (primary) hypertension: Secondary | ICD-10-CM | POA: Diagnosis not present

## 2011-05-25 DIAGNOSIS — I4891 Unspecified atrial fibrillation: Secondary | ICD-10-CM

## 2011-05-25 DIAGNOSIS — I5022 Chronic systolic (congestive) heart failure: Secondary | ICD-10-CM | POA: Insufficient documentation

## 2011-05-25 DIAGNOSIS — R42 Dizziness and giddiness: Secondary | ICD-10-CM | POA: Insufficient documentation

## 2011-05-25 DIAGNOSIS — G4733 Obstructive sleep apnea (adult) (pediatric): Secondary | ICD-10-CM | POA: Diagnosis not present

## 2011-05-25 DIAGNOSIS — E785 Hyperlipidemia, unspecified: Secondary | ICD-10-CM | POA: Diagnosis not present

## 2011-05-28 ENCOUNTER — Encounter: Payer: Self-pay | Admitting: Internal Medicine

## 2011-05-28 ENCOUNTER — Other Ambulatory Visit: Payer: Self-pay | Admitting: Internal Medicine

## 2011-05-28 ENCOUNTER — Ambulatory Visit (INDEPENDENT_AMBULATORY_CARE_PROVIDER_SITE_OTHER): Payer: Medicare Other | Admitting: Internal Medicine

## 2011-05-28 VITALS — BP 152/70 | HR 67 | Wt 164.2 lb

## 2011-05-28 DIAGNOSIS — F419 Anxiety disorder, unspecified: Secondary | ICD-10-CM

## 2011-05-28 DIAGNOSIS — R21 Rash and other nonspecific skin eruption: Secondary | ICD-10-CM

## 2011-05-28 DIAGNOSIS — I4891 Unspecified atrial fibrillation: Secondary | ICD-10-CM

## 2011-05-28 DIAGNOSIS — F411 Generalized anxiety disorder: Secondary | ICD-10-CM

## 2011-05-28 DIAGNOSIS — I2589 Other forms of chronic ischemic heart disease: Secondary | ICD-10-CM

## 2011-05-28 NOTE — Assessment & Plan Note (Signed)
Echo suggests inteval improvement, but certaininly no worse  We discussed the role of MRI to adjudicate but will defer

## 2011-05-28 NOTE — Assessment & Plan Note (Signed)
Will follow  i wonder if it is more chronic and now just observed

## 2011-05-28 NOTE — Assessment & Plan Note (Signed)
Significant psyhcosocial stress  Suggested consideration of counseling

## 2011-05-28 NOTE — Progress Notes (Signed)
  HPI  Caleb [name] is a 71 y.o. male Seen in followup for paroxysmal fibrillation ischemic heart disease with prior MI and LAD stenting    Most recent Myoview was February 2012 Demonstrating ejection fraction of38 % With Anteroapical hypokinesis Prior anterior (septal apical) MI And felt to be a low-risk study.  Echo Feb 2013 improved LV function  The patient denies chest pain,shortness of breath is better  No edema  There is a rash on his legs which has been seen by many for which no explanation is yet forthcomng  meds have been changed without resolution   Cath 2005 oronary artery disease, status post prior anterior wall  myocardial infarction treated with stenting in the left anterior descending  artery October 2004 with 0% stenosis at the stent site in the proximal left  anterior descending artery, 60% narrowing in the mid left anterior  descending artery, and 90% ostial narrowing of a very small diagonal branch,  no major obstruction of the circumflex artery, 30% narrowing in the proximal right coronary artery.    Past Medical History  Diagnosis Date  . CAD (coronary artery disease)   . Atrial fibrillation   . Cardiomyopathy, ischemic   . Heart failure, systolic, acute on chronic   . Hypertension   . Hyperlipidemia   . Pleural effusion   . Positional vertigo   . Dizziness   . GERD (gastroesophageal reflux disease)   . Atypical pneumonia   . Cough   . Allergic rhinitis   . TIA (transient ischemic attack)   . OSA (obstructive sleep apnea)     Home sleep test 07/05/2009 AHI 8.2    Past Surgical History  Procedure Date  . Coronary stent placement 2004    LAD  . Shoulder surgery     Current Outpatient Prescriptions  Medication Sig Dispense Refill  . dofetilide (TIKOSYN) 500 MCG capsule Take 500 mcg by mouth every 12 (twelve) hours.        Marland Kitchen eplerenone (INSPRA) 25 MG tablet Take 1 tablet (25 mg total) by mouth daily.  30 tablet  11  . nitroGLYCERIN  (NITROSTAT) 0.4 MG SL tablet Place 0.4 mg under the tongue every 5 (five) minutes as needed. May repeat for up to 3 doses.       . Omega-3 Fatty Acids (THE VERY FINEST FISH OIL) LIQD Take 3 g by mouth daily.        Marland Kitchen PRADAXA 150 MG CAPS TAKE (1) CAPSULE TWICE   DAILY.  60 capsule  5  . TOPROL XL 25 MG 24 hr tablet TAKE 1 TABLET ONCE DAILY.  90 each  3  . valsartan (DIOVAN) 40 MG tablet Take 40 mg by mouth 2 (two) times daily.          Allergies  Allergen Reactions  . Sulfonamide Derivatives     REACTION: rash    Review of Systems negative except from HPI and PMH  Physical Exam Well developed and well nourished in no acute distress HENT normal E scleral and icterus clear Neck Supple JVP flat; carotids brisk and full Clear to ausculation Regular rate and rhythm, no murmurs gallops or rub Soft with active bowel sounds No clubbing cyanosis and edema Alert and oriented, grossly normal motor and sensory function Skin Warm and Dry; hypermpigmnetation along lateral shins R>L  ECG Sinus rhythm at 67 Intervals 0.20/0.11/0.46 Axis is -32 Mild ST segment flattening and prior septal infarct ucnhanged 7/12  Assessment and  Plan

## 2011-05-28 NOTE — Patient Instructions (Signed)
Your physician recommends that you continue on your current medications as directed. Please refer to the Current Medication list given to you today.  Your physician wants you to follow-up in: 6 months. You will receive a reminder letter in the mail two months in advance. If you don't receive a letter, please call our office to schedule the follow-up appointment.  

## 2011-05-28 NOTE — Assessment & Plan Note (Signed)
Stable  Continue .apixoban

## 2011-06-01 ENCOUNTER — Telehealth: Payer: Self-pay | Admitting: Internal Medicine

## 2011-06-01 NOTE — Telephone Encounter (Signed)
I left a message for the patient that I received a copy of the appeals forms and was sending them off today.

## 2011-06-01 NOTE — Telephone Encounter (Signed)
New  msg Pt called and said he received appeal forms. He wanted to know should he bring them to you to fill out. Please let him know this morning

## 2011-06-02 ENCOUNTER — Telehealth: Payer: Self-pay | Admitting: Internal Medicine

## 2011-06-02 NOTE — Telephone Encounter (Signed)
I called and spoke with Marita. I explained to her that there was no specific reason for the patient not to be on Xarelto. Eliquis was chosen after discussion of the two drugs with the patient. Per Coralyn Pear, she will send this to the medical director for review and we will receive a decision within 72 hours.

## 2011-06-02 NOTE — Telephone Encounter (Signed)
New problem  Per [facility], follow up on discussion from yesterday- regarding patient medication.

## 2011-06-12 ENCOUNTER — Telehealth: Payer: Self-pay | Admitting: Internal Medicine

## 2011-06-12 DIAGNOSIS — I4891 Unspecified atrial fibrillation: Secondary | ICD-10-CM

## 2011-06-12 NOTE — Telephone Encounter (Signed)
Spoke with [name] who is experiencing anxious feeling and difficulty sleeping which she ascribes to his apixoban His leg issues that he attributed to dabigitran has not changed appreciably since it was stopped. We discussed 2 options both predicated on his implied association between his symptoms and  apixoban The first is 2 resume dabigtran and the other is to try Rivaroxaban the problem with the latter is that it was rejected by his insurance company apparently.  He is agreeable to the former. He'll let us know how he does. Will plan to have him hold one dose this evening and resume his pradaxa in the morning, having been taken for hiatus because of the different mechanism of action of these 2 drugs. I told the mom not sure that this is necessary but it seemed reasonable

## 2011-06-12 NOTE — Telephone Encounter (Signed)
Will forward to [name] for review.

## 2011-06-12 NOTE — Telephone Encounter (Signed)
Pt rtn call again to make sure he gets called today since it's Friday, pls call [phone] first then try [phone]

## 2011-06-12 NOTE — Telephone Encounter (Signed)
Please return call to patient at [phone]   Patient has questions regarding anti-coag meds, would like to speak with [name] if possible

## 2011-06-18 ENCOUNTER — Telehealth: Payer: Self-pay | Admitting: Internal Medicine

## 2011-06-18 NOTE — Telephone Encounter (Signed)
Pt calling re problem with meds that heather was working on, wants a call from her

## 2011-06-18 NOTE — Telephone Encounter (Signed)
Encounter closed in error. I spoke with the patient. He states he has thought about the xarelto some more and would like to try this based on the fact that he still has the rash/ redness to his legs. He still feels this is related to Pradaxa. He would like Dr. Olin Pia approval for this. I explained I will review with him and then we can submit a prescription for him. We will get a notification for PA. I explained to the patient that we may can get PA for this due to the fact there is a potential side effect with Pradaxa and Xarelto is the next step drug per his insurance. The patient states he also had further information for [name] on a "non" medical issue that he would like to talk with [name] further about. He states he has spoken with [name] about this previously and he would know what it is. I will forward to [name] for review.

## 2011-07-06 NOTE — Telephone Encounter (Signed)
Spoke with wife as husband not at home Have called back  Discussed stress situation

## 2011-08-06 ENCOUNTER — Ambulatory Visit: Payer: Medicare Other | Admitting: Internal Medicine

## 2011-08-26 ENCOUNTER — Telehealth: Payer: Self-pay | Admitting: Family Medicine

## 2011-08-26 NOTE — Telephone Encounter (Signed)
Spoke with patient and an appointment scheduled

## 2011-08-26 NOTE — Telephone Encounter (Signed)
Caleb [name], pulled this from Triage vmail. Dr. Gerre Pebbles called at 9:15. He would like Dr. Sherren Mocha to call him, as he wants his opinion on a possible medication reaction he is having. Thank you.

## 2011-08-27 ENCOUNTER — Encounter: Payer: Self-pay | Admitting: Family Medicine

## 2011-08-27 ENCOUNTER — Ambulatory Visit (INDEPENDENT_AMBULATORY_CARE_PROVIDER_SITE_OTHER): Payer: Medicare Other | Admitting: Family Medicine

## 2011-08-27 VITALS — BP 130/70 | Temp 97.8°F | Wt 168.0 lb

## 2011-08-27 DIAGNOSIS — R21 Rash and other nonspecific skin eruption: Secondary | ICD-10-CM

## 2011-08-27 MED ORDER — PREDNISONE 20 MG PO TABS: ORAL_TABLET | ORAL | Status: DC

## 2011-08-27 NOTE — Progress Notes (Signed)
  Subjective:    Patient ID: Caleb [name], male    DOB: November 30, 1940, 71 y.o.   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 In January he noticed a rash on his lower extremities. He began with itching and and some red blotches.  Cardiology was unsure of the cause of the rash and sent him to see Dr. Amy [name] dermatologist. She was not sure exactly what it is thought it might have been a contact dermatitis and put him on a combination of a steroid cream and moisturizer. This did not help the rash or the itching.  The PI shows this can be a reaction from the above medication     Review of Systems General and dermatologic review of systems otherwise negative    Objective:   Physical Exam Well-developed well-nourished male in no acute distress.  Examination of the lower extremities shows small areas of erythema throughout both lower extremities they blanch easily otherwise skin appears normal       Assessment & Plan:  Skin rash lower extremities probably secondary to anticoagulant medication. We discussed stress options including a trial of oral prednisone however he says this causes him CNS side effects. He would prefer to consider going back to Coumadin

## 2011-09-29 ENCOUNTER — Telehealth: Payer: Self-pay | Admitting: Internal Medicine

## 2011-09-29 NOTE — Telephone Encounter (Signed)
I spoke with the patient. He states he is having a reoccurrence of the rash on his legs which he feels is related to pradaxa. He states this got better, but never completely went away. He would like Dr. Olin Pia ok to stop pradaxa and start xarelto. I explained I will forward to [name] for review. I will call him back after that. He would need his RX sent to [facility].

## 2011-09-29 NOTE — Telephone Encounter (Signed)
Pt calling re questions re medication, requesting nurse call, pls call

## 2011-09-30 NOTE — Telephone Encounter (Signed)
He can certainly have Rivaroxaban

## 2011-10-02 ENCOUNTER — Telehealth: Payer: Self-pay | Admitting: Internal Medicine

## 2011-10-02 NOTE — Telephone Encounter (Signed)
Patient is aware that [name] okay for him to changed from Pradaxa to [place] . Patient would like to start with the lower dose 15 mg instead of 20 mg. He will call on Monday to speak with MD or nurse  to find out about the dose.

## 2011-10-02 NOTE — Telephone Encounter (Signed)
New msg Pt was calling about switching from pradaxa to xeralto.  His pharmacy is [facility].  He said he had talked with [name] but hasnt heard anything. Please call

## 2011-10-05 ENCOUNTER — Telehealth: Payer: Self-pay | Admitting: Internal Medicine

## 2011-10-05 NOTE — Telephone Encounter (Signed)
Wants to change dabigitran 2/2 discoloration>>   wants to try Rivaroxaban  20 mg daily Lets see if we can get samples    Previously tried  apixoban with sideeffects

## 2011-10-05 NOTE — Telephone Encounter (Signed)
Fu call Pt is calling back to talk to you about this issue

## 2011-10-06 NOTE — Telephone Encounter (Signed)
10/05/2011 6:43 PM Telephone Description:  71 year old male   Provider:  Virl Axe, MD  MRN: [id number] Department:  [place]                Call Documentation     Virl Axe, MD 10/05/2011 6:48 PM Signed  Wants to change dabigitran  2/2 discoloration>>  wants to try Rivaroxaban 20 mg daily  Lets see if we can get samples  Previously tried apixoban with sideeffects

## 2011-10-06 NOTE — Telephone Encounter (Signed)
I spoke with the patient. I made him aware I do not have any xarelto 20 mg samples in the office today. I explained I will be out on Wednesday and Thursday, but back on Friday. I will check then to see if the xarelto rep has come by or not. I will call the patient back on Friday.

## 2011-10-09 NOTE — Telephone Encounter (Signed)
Samples of Xarelto 20 mg pulled for the patient.  Lot #- [id number] Exp- 3/15 # 10 The patient is aware they are being placed at the front desk.

## 2011-10-20 ENCOUNTER — Telehealth: Payer: Self-pay | Admitting: Internal Medicine

## 2011-10-20 NOTE — Telephone Encounter (Signed)
Please return call to patient [phone] (work until 4pm), please called patient at hm# after 5pm   Patient has questions about medication changes.

## 2011-10-20 NOTE — Telephone Encounter (Signed)
I spoke with the patient. He wanted to know about transition off of pradaxa and on to [place]. I reviewed with Porfirio Oar, Pharm D. Per Gay Filler, the patient should take his am Pradaxa and then replace the evening dose with the Xarelto (and take with a meal). The patient is aware and verbalizes understanding. He will start Xarelto samples and call me with how he is doing with those. If tolerating, I will call his RX to [facility] and proceed with PA. He is agreeable.

## 2011-11-04 ENCOUNTER — Telehealth: Payer: Self-pay | Admitting: Internal Medicine

## 2011-11-04 NOTE — Telephone Encounter (Signed)
lmtcb

## 2011-11-04 NOTE — Telephone Encounter (Signed)
Pt walked in, pt has 3-4 dime size bruises in multiple stages of healing. Pt concerned with new med. Denies blood in stool/ no stomach upset/ no c/o other symptoms. Pt was offered to talk with Dr Caryl Comes at end of day or Dr Caryl Comes offered to call him, pt accepted offer to be called, Dr Caryl Comes was informed to call him, number was provided.

## 2011-11-04 NOTE — Telephone Encounter (Signed)
New msg Pt called and wanted to know about bruising he is having while taking blood thinner. Please call

## 2011-11-20 ENCOUNTER — Ambulatory Visit (INDEPENDENT_AMBULATORY_CARE_PROVIDER_SITE_OTHER): Payer: Medicare Other | Admitting: Internal Medicine

## 2011-11-20 ENCOUNTER — Encounter: Payer: Self-pay | Admitting: Internal Medicine

## 2011-11-20 VITALS — BP 158/64 | HR 76

## 2011-11-20 DIAGNOSIS — R799 Abnormal finding of blood chemistry, unspecified: Secondary | ICD-10-CM

## 2011-11-20 DIAGNOSIS — R7989 Other specified abnormal findings of blood chemistry: Secondary | ICD-10-CM

## 2011-11-20 DIAGNOSIS — E7800 Pure hypercholesterolemia, unspecified: Secondary | ICD-10-CM

## 2011-11-20 DIAGNOSIS — E785 Hyperlipidemia, unspecified: Secondary | ICD-10-CM | POA: Diagnosis not present

## 2011-11-20 DIAGNOSIS — I4891 Unspecified atrial fibrillation: Secondary | ICD-10-CM | POA: Diagnosis not present

## 2011-11-20 DIAGNOSIS — I5022 Chronic systolic (congestive) heart failure: Secondary | ICD-10-CM

## 2011-11-20 DIAGNOSIS — I10 Essential (primary) hypertension: Secondary | ICD-10-CM

## 2011-11-20 LAB — LIPID PANEL
HDL: 60.1 mg/dL (ref >39.00)
Total CHOL/HDL Ratio: 3
Triglycerides: 57 mg/dL (ref 0.0–149.0)

## 2011-11-20 LAB — BASIC METABOLIC PANEL
BUN: 12 mg/dL (ref 6–23)
CO2: 28 mEq/L (ref 19–32)
Calcium: 9.1 mg/dL (ref 8.4–10.5)
Glucose, Bld: 99 mg/dL (ref 70–99)
Sodium: 136 mEq/L (ref 135–145)

## 2011-11-20 LAB — LDL CHOLESTEROL, DIRECT: Direct LDL: 131.8 mg/dL

## 2011-11-20 LAB — HEMOGLOBIN A1C: Hgb A1c MFr Bld: 5.6 % (ref 4.6–6.5)

## 2011-11-20 MED ORDER — RIVAROXABAN 20 MG PO TABS: 20.0000 mg | ORAL_TABLET | Freq: Every day | ORAL | Status: DC

## 2011-11-20 NOTE — Assessment & Plan Note (Signed)
Blood pressure appears elevated as it is about half the time in the office. He says at home it typically runs 120. He is some component white count hypertension.

## 2011-11-20 NOTE — Assessment & Plan Note (Signed)
We will check his fasting lipid profile. LDL target would be between 70 and 100

## 2011-11-20 NOTE — Assessment & Plan Note (Signed)
Euvolemic. There data suggests may benefit from aldosterone antagonism therapy. He is on beta blockers as well as intravenous

## 2011-11-20 NOTE — Progress Notes (Signed)
Patient Care Team: Dorena Cookey, MD as PCP - General Elsie Stain, MD (Pulmonary Disease)   HPI  Caleb [name] is a 71 y.o. male Seen in followup for paroxysmal fibrillation i in the context of schemic heart disease with prior MI and LAD stenting  He also has a history of a prior stroke and is on anticoagulation.  He has had some trouble with rashes he has attributed to most specifically to dabigitran. He is currently taking.Rivaroxaban and is having problems with easy bleeding. We had a lengthy discussion in a car a few weeks ago regarding wanting to take a lower dose. I told him we have no data repeat with normal renal function efficacy.    The patient denies chest pain, shortness of breath, nocturnal dyspnea, orthopnea or peripheral edema.  There have been no palpitations, lightheadedness or syncope.   He does have some fatigability. He denies sleep apnea and has seen pulmonary.   .  Most recent Myoview was February 2012 Demonstrating ejection fraction of38 % With Anteroapical hypokinesis  Prior anterior (septal apical) MI And felt to be a low-risk study.  Echo June 2012 Also demonstrated an ejection fraction of 30-35% with mild to moderate mitral regurgitation and mild biatrial enlargement  Cath 2005 oronary artery disease, status post prior anterior wall  myocardial infarction treated with stenting in the left anterior descending  artery October 2004 with 0% stenosis at the stent site in the proximal left  anterior descending artery, 60% narrowing in the mid left anterior  descending artery, and 90% ostial narrowing of a very small diagonal branch,  no major obstruction of the circumflex artery, 30% narrowing in the proximal right coronary artery.     Past Medical History  Diagnosis Date  . CAD (coronary artery disease)   . Atrial fibrillation   . Cardiomyopathy, ischemic   . Heart failure, systolic, acute on chronic   . Hypertension   . Hyperlipidemia   . Pleural  effusion   . Positional vertigo   . Dizziness   . GERD (gastroesophageal reflux disease)   . Atypical pneumonia   . Cough   . Allergic rhinitis   . TIA (transient ischemic attack)   . OSA (obstructive sleep apnea)     Home sleep test 07/05/2009 AHI 8.2    Past Surgical History  Procedure Date  . Coronary stent placement 2004    LAD  . Shoulder surgery     Current Outpatient Prescriptions  Medication Sig Dispense Refill  . dabigatran (PRADAXA) 150 MG CAPS Take 1 capsule (150 mg total) by mouth every 12 (twelve) hours.      . nitroGLYCERIN (NITROSTAT) 0.4 MG SL tablet Place 0.4 mg under the tongue every 5 (five) minutes as needed. May repeat for up to 3 doses.       . Omega-3 Fatty Acids (THE VERY FINEST FISH OIL) LIQD Take 2-3 g by mouth daily.       . predniSONE (DELTASONE) 20 MG tablet 1 tab x5 days, a half a tab x5 days, then a half a tablet Monday Wednesday Friday for a 2 week taper  30 tablet  1  . TIKOSYN 500 MCG capsule TAKE 1 CAPSULE EVERY 12   HOURS  180 capsule  1  . TOPROL XL 25 MG 24 hr tablet TAKE 1 TABLET ONCE DAILY.  90 each  3  . valsartan (DIOVAN) 40 MG tablet Take 1 tablet (40 mg total) by mouth 2 (two) times daily.  180 tablet  3  . DISCONTD: eplerenone (INSPRA) 25 MG tablet Take 1 tablet (25 mg total) by mouth daily.  30 tablet  11    Allergies  Allergen Reactions  . Sulfonamide Derivatives     REACTION: rash    Review of Systems negative except from HPI and PMH  Physical Exam BP 158/64  Pulse 76 Well developed and well nourished in no acute distress HENT normal E scleral and icterus clear Neck Supple JVP flat; carotids brisk and full Clear to ausculation Irregular rate and rhythm,S4, early systolic murmur  Soft with active bowel sounds No clubbing cyanosis none Edema Alert and oriented, grossly normal motor and sensory function Skin Warm and Dry  ECG   Assessment and  Plan

## 2011-11-20 NOTE — Patient Instructions (Addendum)
Your physician recommends that you have lab work today: HbgA1C/ bmp/magnesium/lipid/direct LDL  Your physician wants you to follow-up in: 6 months with [name]. You will receive a reminder letter in the mail two months in advance. If you don't receive a letter, please call our office to schedule the follow-up appointment.  Your physician recommends that you continue on your current medications as directed. Please refer to the Current Medication list given to you today.

## 2011-11-20 NOTE — Assessment & Plan Note (Signed)
He has paroxysmal atrial fibrillation. He is on dofetilide. We'll check surveillance laboratories today. He continues to ponder the possibility of catheter ablation we discussed the possibility of going to the [facility] clinic.  As relates anticoagulation, he will continue on Rivaroxaban . We have also discussed a little bit of left atrial occlusion and left atrial surgical appendectomy

## 2011-11-26 ENCOUNTER — Telehealth: Payer: Self-pay | Admitting: *Deleted

## 2011-11-26 NOTE — Telephone Encounter (Signed)
I called [employer] at [phone] and obtained prior authorization for the patient's xarelto- good through 11/25/2012. I have left a message on the pharmacy voice mail at [facility] that this has been done.

## 2011-12-01 ENCOUNTER — Telehealth: Payer: Self-pay | Admitting: *Deleted

## 2011-12-01 NOTE — Telephone Encounter (Signed)
Notes Recorded by Emily Filbert, RN on 12/01/2011 at 4:48 PM The patient is aware of Dr. Olin Pia recommendations. He will stay with the recommended 20 mg of xarelto for now. Notes Recorded by Emily Filbert, RN on 12/01/2011 at 4:39 PM I left a message for the patient to call regarding Dr. Olin Pia recommendations. Notes Recorded by Deboraha Sprang, MD on 12/01/2011 at 1:06 PM [name] and I have discussed the role of Rivaroxaban 15 mg versus 20 mg. We have no data on the effectiveness of the lower dose in people with normal kidney function. I have told him this. He has a prior history of a neurological event. He is free to choose what he would like to do although I do not support the decision of 15 mg. Notes Recorded by Christena Deem, RN on 11/30/2011 at 10:10 AM Pt given lab results and wanted me to let dr Caryl Comes know that he is continuing to fight with dr Caryl Comes reguarding xarelto--he states 24m is too much--he wants to take 134mand will continue to believe that 2069ms causing too much bruising Notes Recorded by SteDeboraha SprangD on 11/27/2011 at 8:15 AM Please Inform Patient that labs are normal except is LDL is at 131

## 2011-12-31 ENCOUNTER — Other Ambulatory Visit: Payer: Self-pay | Admitting: Internal Medicine

## 2012-01-15 ENCOUNTER — Other Ambulatory Visit: Payer: Self-pay | Admitting: *Deleted

## 2012-01-15 MED ORDER — VALSARTAN 40 MG PO TABS: 40.0000 mg | ORAL_TABLET | Freq: Two times a day (BID) | ORAL | Status: DC

## 2012-02-25 ENCOUNTER — Other Ambulatory Visit: Payer: Self-pay | Admitting: Internal Medicine

## 2012-03-30 ENCOUNTER — Other Ambulatory Visit: Payer: Self-pay | Admitting: Internal Medicine

## 2012-04-18 ENCOUNTER — Telehealth: Payer: Self-pay | Admitting: Internal Medicine

## 2012-04-18 ENCOUNTER — Telehealth: Payer: Self-pay

## 2012-04-18 NOTE — Telephone Encounter (Signed)
Called patient back regarding medication question and he said he would like someone to call him back this afternoon at 5pm or later to discuss xarelto

## 2012-04-18 NOTE — Telephone Encounter (Signed)
Pt calling re problem with med , pls call

## 2012-04-25 ENCOUNTER — Other Ambulatory Visit: Payer: Self-pay | Admitting: *Deleted

## 2012-04-25 MED ORDER — WARFARIN SODIUM 5 MG PO TABS: 5.0000 mg | ORAL_TABLET | Freq: Every day | ORAL | Status: DC

## 2012-04-25 NOTE — Telephone Encounter (Signed)
New Problem     Pt states he is having a reaction to Xarelto. C/o rash on legs, arms, and trunk area, swelling of feet and legs. Would like to speak to nurse regarding this issue and switching blood thinners.

## 2012-04-25 NOTE — Telephone Encounter (Addendum)
Patient has concerns that current symptomology is from continued use of Xarelto (started on 11/20/11). He reports general worsening of mild rash to arms, legs, trunks with itching especially to beltline area/covered areas "creepy crawly feeling" and some occasional feet swelling.   [name] states patient to be bridged back to Coumadin.   Called patient to discuss above. Patient agrees to plan.  Routing to [facility] - Tiffany Muse, [name], Alvis Lemmings.

## 2012-04-26 NOTE — Telephone Encounter (Signed)
Pt aware that he will need to be bridged to coumadin from Xarelto with Lovenox.  Coumadin brand name order per pt request to [facility]. Pt has not had any labs since 10/2011 or documented weight. Thus, he is coming in on 04/27/12 for BMP and stop in [facility] for weight so Lovenox can be ordered. He is aware to continue Xarelto daily until transition is made by CVRR.

## 2012-04-27 ENCOUNTER — Ambulatory Visit (INDEPENDENT_AMBULATORY_CARE_PROVIDER_SITE_OTHER): Payer: Medicare Other | Admitting: *Deleted

## 2012-04-27 DIAGNOSIS — I10 Essential (primary) hypertension: Secondary | ICD-10-CM | POA: Diagnosis not present

## 2012-04-27 LAB — BASIC METABOLIC PANEL
BUN: 12 mg/dL (ref 6–23)
Creatinine, Ser: 0.9 mg/dL (ref 0.4–1.5)
GFR: 92.52 mL/min (ref >60.00)
Potassium: 4 mEq/L (ref 3.5–5.1)

## 2012-04-27 MED ORDER — ENOXAPARIN SODIUM 80 MG/0.8ML ~~LOC~~ SOLN: 80.0000 mg | Freq: Two times a day (BID) | SUBCUTANEOUS | Status: DC

## 2012-04-27 NOTE — Telephone Encounter (Signed)
Pt stopped in [facility] for education on Lovenox 2/5 and had labs drawn for renal fx eval and wt. Wt 165lb Cr 1 Crcl 21m/min He will be prescribed Lovenox 894msq q12hr x 5 days to being 2/6 evening about 24hr after last dose of Xarelto.  He is also to begin his Coumadin at that time as well.  He has a follow up INR check 2/10.

## 2012-04-29 ENCOUNTER — Other Ambulatory Visit: Payer: Self-pay | Admitting: Internal Medicine

## 2012-04-30 ENCOUNTER — Telehealth: Payer: Self-pay | Admitting: Physician Assistant

## 2012-04-30 NOTE — Telephone Encounter (Signed)
Patient called re: question with bridging off Xarelto with Lovenox to Coumadin. He reports having adverse reactions with various forms of anticoagulation. He notes lower extremity swelling and itching currently on Lovenox and Coumadin. He is wondering about discontinuing Lovenox today in an attempt to relieve these symptoms as he believes Lovenox may be causing them. He has an appointment with the Coumadin clinic on 05/02/12. Advised to continue Lovenox/Coumadin as we do not yet know his INR, and he would be a thromboembolic risk if he prematurely stopped the Lovenox with a sub-therapeutic INR. Advised to watch symptoms over the weekend, and if they become severe/intolerable, to present to an urgent care/ED where he could be evaluated and INR drawn. He understood and agreed.   Jacquelynn Cree, PA-C 04/30/2012 9:45 AM

## 2012-05-02 ENCOUNTER — Ambulatory Visit (INDEPENDENT_AMBULATORY_CARE_PROVIDER_SITE_OTHER): Payer: Medicare Other | Admitting: Pharmacist

## 2012-05-02 DIAGNOSIS — Z7901 Long term (current) use of anticoagulants: Secondary | ICD-10-CM

## 2012-05-02 DIAGNOSIS — I4891 Unspecified atrial fibrillation: Secondary | ICD-10-CM

## 2012-05-02 LAB — POCT INR: INR: 1.5

## 2012-05-04 ENCOUNTER — Ambulatory Visit (INDEPENDENT_AMBULATORY_CARE_PROVIDER_SITE_OTHER): Payer: Medicare Other | Admitting: *Deleted

## 2012-05-04 DIAGNOSIS — I4891 Unspecified atrial fibrillation: Secondary | ICD-10-CM | POA: Diagnosis not present

## 2012-05-04 DIAGNOSIS — Z7901 Long term (current) use of anticoagulants: Secondary | ICD-10-CM | POA: Diagnosis not present

## 2012-05-04 LAB — POCT INR: INR: 1.8

## 2012-05-09 ENCOUNTER — Ambulatory Visit (INDEPENDENT_AMBULATORY_CARE_PROVIDER_SITE_OTHER): Payer: Medicare Other

## 2012-05-09 DIAGNOSIS — Z7901 Long term (current) use of anticoagulants: Secondary | ICD-10-CM

## 2012-05-09 DIAGNOSIS — I4891 Unspecified atrial fibrillation: Secondary | ICD-10-CM

## 2012-05-16 ENCOUNTER — Ambulatory Visit (INDEPENDENT_AMBULATORY_CARE_PROVIDER_SITE_OTHER): Payer: Medicare Other | Admitting: Pharmacist

## 2012-05-16 DIAGNOSIS — I4891 Unspecified atrial fibrillation: Secondary | ICD-10-CM | POA: Diagnosis not present

## 2012-05-16 DIAGNOSIS — Z7901 Long term (current) use of anticoagulants: Secondary | ICD-10-CM | POA: Diagnosis not present

## 2012-05-25 ENCOUNTER — Telehealth: Payer: Self-pay | Admitting: Internal Medicine

## 2012-05-25 NOTE — Telephone Encounter (Signed)
Spoke with patient regarding question as to whether Tikosyn should be discontinued/addressed as to current effectiveness. States he feels he is back in AFib as of the past Sunday (x4 days now) couple of times per day with SOB with exertion, foggy thinking, and causing him to wake up sometimes late at night. Current HR 90-100.  [name] states that patient needs to be scheduled to see him in office next week (has previous/current appt on 3/18).

## 2012-05-25 NOTE — Telephone Encounter (Signed)
New problems   Tikosyn  is not longer functioning or working for him.

## 2012-05-26 NOTE — Telephone Encounter (Signed)
I spoke with the patient and made him aware that if 3/11 would not work for him, then we could see him on 3/14. I will schedule him for 3/14 at 12:15pm. He will call back if this does not work. I will leave his 3/18 appointment for now in case there is a conflict. He is agreeable.

## 2012-05-31 ENCOUNTER — Emergency Department (HOSPITAL_COMMUNITY): Payer: Medicare Other

## 2012-05-31 ENCOUNTER — Encounter (HOSPITAL_COMMUNITY): Payer: Self-pay | Admitting: Emergency Medicine

## 2012-05-31 ENCOUNTER — Ambulatory Visit: Payer: Self-pay | Admitting: Internal Medicine

## 2012-05-31 ENCOUNTER — Telehealth: Payer: Self-pay | Admitting: Internal Medicine

## 2012-05-31 ENCOUNTER — Emergency Department (HOSPITAL_COMMUNITY)
Admission: EM | Admit: 2012-05-31 | Discharge: 2012-05-31 | Disposition: A | Discharge status: Home / Self Care | Payer: Medicare Other | Attending: Emergency Medicine | Admitting: Emergency Medicine

## 2012-05-31 DIAGNOSIS — Z8701 Personal history of pneumonia (recurrent): Secondary | ICD-10-CM | POA: Diagnosis not present

## 2012-05-31 DIAGNOSIS — Z8669 Personal history of other diseases of the nervous system and sense organs: Secondary | ICD-10-CM | POA: Diagnosis not present

## 2012-05-31 DIAGNOSIS — Z9861 Coronary angioplasty status: Secondary | ICD-10-CM | POA: Insufficient documentation

## 2012-05-31 DIAGNOSIS — Z7901 Long term (current) use of anticoagulants: Secondary | ICD-10-CM | POA: Diagnosis not present

## 2012-05-31 DIAGNOSIS — I5023 Acute on chronic systolic (congestive) heart failure: Secondary | ICD-10-CM | POA: Insufficient documentation

## 2012-05-31 DIAGNOSIS — R55 Syncope and collapse: Secondary | ICD-10-CM | POA: Insufficient documentation

## 2012-05-31 DIAGNOSIS — Z8709 Personal history of other diseases of the respiratory system: Secondary | ICD-10-CM | POA: Insufficient documentation

## 2012-05-31 DIAGNOSIS — R42 Dizziness and giddiness: Secondary | ICD-10-CM | POA: Diagnosis not present

## 2012-05-31 DIAGNOSIS — I2510 Atherosclerotic heart disease of native coronary artery without angina pectoris: Secondary | ICD-10-CM | POA: Insufficient documentation

## 2012-05-31 DIAGNOSIS — Z8679 Personal history of other diseases of the circulatory system: Secondary | ICD-10-CM | POA: Insufficient documentation

## 2012-05-31 DIAGNOSIS — R000 Tachycardia, unspecified: Secondary | ICD-10-CM | POA: Diagnosis not present

## 2012-05-31 DIAGNOSIS — I10 Essential (primary) hypertension: Secondary | ICD-10-CM | POA: Diagnosis not present

## 2012-05-31 DIAGNOSIS — K219 Gastro-esophageal reflux disease without esophagitis: Secondary | ICD-10-CM | POA: Diagnosis not present

## 2012-05-31 DIAGNOSIS — Z79899 Other long term (current) drug therapy: Secondary | ICD-10-CM | POA: Insufficient documentation

## 2012-05-31 DIAGNOSIS — I4891 Unspecified atrial fibrillation: Secondary | ICD-10-CM | POA: Insufficient documentation

## 2012-05-31 DIAGNOSIS — R002 Palpitations: Secondary | ICD-10-CM | POA: Diagnosis not present

## 2012-05-31 DIAGNOSIS — I2589 Other forms of chronic ischemic heart disease: Secondary | ICD-10-CM | POA: Diagnosis present

## 2012-05-31 DIAGNOSIS — Z8673 Personal history of transient ischemic attack (TIA), and cerebral infarction without residual deficits: Secondary | ICD-10-CM | POA: Insufficient documentation

## 2012-05-31 DIAGNOSIS — R404 Transient alteration of awareness: Secondary | ICD-10-CM | POA: Diagnosis not present

## 2012-05-31 HISTORY — DX: Long term (current) use of anticoagulants: Z79.01

## 2012-05-31 LAB — POCT I-STAT, CHEM 8
BUN: 15 mg/dL (ref 6–23)
Calcium, Ion: 1.14 mmol/L (ref 1.13–1.30)
Chloride: 104 mEq/L (ref 96–112)
Creatinine, Ser: 0.9 mg/dL (ref 0.50–1.35)
Glucose, Bld: 121 mg/dL — ABNORMAL HIGH (ref 70–99)
HCT: 48 % (ref 39.0–52.0)
Hemoglobin: 16.3 g/dL (ref 13.0–17.0)
Potassium: 4.2 mEq/L (ref 3.5–5.1)
Sodium: 139 mEq/L (ref 135–145)
TCO2: 24 mmol/L (ref 0–100)

## 2012-05-31 LAB — CBC WITH DIFFERENTIAL/PLATELET
Eosinophils Absolute: 0.2 10*3/uL (ref 0.0–0.7)
Hemoglobin: 16 g/dL (ref 13.0–17.0)
Lymphocytes Relative: 18 % (ref 12–46)
Lymphs Abs: 1.2 10*3/uL (ref 0.7–4.0)
MCH: 30.9 pg (ref 26.0–34.0)
Monocytes Relative: 7 % (ref 3–12)
Neutrophils Relative %: 72 % (ref 43–77)
RBC: 5.17 MIL/uL (ref 4.22–5.81)
WBC: 6.9 10*3/uL (ref 4.0–10.5)

## 2012-05-31 LAB — APTT: aPTT: 39 seconds — ABNORMAL HIGH (ref 24–37)

## 2012-05-31 LAB — POCT I-STAT TROPONIN I: Troponin i, poc: 0.02 ng/mL (ref 0.00–0.08)

## 2012-05-31 LAB — PROTIME-INR
INR: 2.05 — ABNORMAL HIGH (ref 0.00–1.49)
Prothrombin Time: 22.3 seconds — ABNORMAL HIGH (ref 11.6–15.2)

## 2012-05-31 MED ORDER — DILTIAZEM LOAD VIA INFUSION
20.0000 mg | Freq: Once | INTRAVENOUS | Status: AC
  Administered 2012-05-31: 20 mg via INTRAVENOUS

## 2012-05-31 MED ORDER — FUROSEMIDE 20 MG PO TABS: 20.0000 mg | ORAL_TABLET | Freq: Two times a day (BID) | ORAL | Status: DC

## 2012-05-31 MED ORDER — POTASSIUM CHLORIDE ER 20 MEQ PO TBCR: 20.0000 meq | EXTENDED_RELEASE_TABLET | Freq: Every day | ORAL | Status: DC

## 2012-05-31 MED ORDER — DILTIAZEM HCL ER COATED BEADS 180 MG PO CP24: 180.0000 mg | ORAL_CAPSULE | Freq: Every day | ORAL | Status: DC

## 2012-05-31 MED ORDER — DILTIAZEM HCL 100 MG IV SOLR
5.0000 mg/h | INTRAVENOUS | Status: DC
  Administered 2012-05-31: 5 mg/h via INTRAVENOUS

## 2012-05-31 MED ORDER — FUROSEMIDE 20 MG PO TABS: 20.0000 mg | ORAL_TABLET | Freq: Every day | ORAL | Status: DC

## 2012-05-31 NOTE — ED Notes (Signed)
[facility] at bedside.

## 2012-05-31 NOTE — ED Notes (Signed)
[place] paged and responded.  PA stated pt can eat and that we are awaiting Dr Zoila Shutter to come and assess pt.  Pt notified and given food.

## 2012-05-31 NOTE — ED Notes (Signed)
PT stood up and stated that he no longer feels dizzy.  Pt drinking water.

## 2012-05-31 NOTE — ED Notes (Signed)
In room w/ pt

## 2012-05-31 NOTE — ED Notes (Signed)
States was at a conference and got dizzy and lightheaded when he stood 12 lead shows a fiib has hx of stent and afib

## 2012-05-31 NOTE — Consult Note (Signed)
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 He was attending a conference today when he had onset of tachypalpitations and presyncope. He was able to ambulate but was very light-headed, dizzy and weak. Over the last few weeks, he has had episodes of atrial fibrillation but converted spontaneously each time. He called [facility] and was to continue his usual medications including Tikosyn and coumadin, come to the office on 3/18. He was sleeping poorly because of the palpitations. He took an extra metoprolol last pm to control his HR and get some rest. It helped but symptoms did not resolve. Today, he took his usual am meds and went to the conference but had to leave because of the weakness/dizziness. In the ER, he was started on IV Cardizem with a 20 mg bolus and is on 5 mg/hr right now. No visual changes, GI symptoms, no chest pain.   Review of Systems:    Cardiac Review of Systems: {Y] = yes _0  = no  Chest Pain [    ]  Resting SOB [   ] Exertional SOB  [ y ]  Orthopnea [  ]   Pedal Edema [   ]    Palpitations [ y ] Syncope  [  ]   Presyncope [  y ]  General Review of Systems: [Y] = yes [  ]=no Constitional: recent weight change [  ]; anorexia [  ]; fatigue [  ]; nausea [  ]; night sweats [  ]; fever [  ]; or chills [  ];                                                                                                                                          Dental: poor dentition[  ]  Eye : blurred vision [  ]; diplopia [   ]; vision changes [  ];  Amaurosis fugax[  ]; Resp: cough [  ];  wheezing[  ];  hemoptysis[  ]; shortness of breath[  ]; paroxysmal nocturnal dyspnea[  ]; dyspnea on exertion[  ]; or orthopnea[  ];  GI:  gallstones[  ], vomiting[  ];  dysphagia[  ]; melena[  ];  hematochezia [  ]; heartburn[  ];   Hx of   Colonoscopy[  ]; GU: kidney stones [  ]; hematuria[  ];   dysuria [  ];  nocturia[  ];  history of     obstruction [  ];                 Skin: rash, swelling[  ];, hair loss[  ];  peripheral edema[  ];  or itching[  ]; Musculosketetal: myalgias[  ];  joint swelling[  ];  joint erythema[  ];  joint pain[  ];  back pain[  ];  Heme/Lymph: bruising[  ];  bleeding[  ];  anemia[  ];  Neuro: TIA[  ];  headaches[  ];  stroke[  ];  vertigo[  ];  seizures[  ];   paresthesias[  ];  difficulty walking[  ];  Psych:depression[  ]; anxiety[  ];  Endocrine: diabetes[  ];  thyroid dysfunction[  ];  Immunizations: Flu [  ]; Pneumococcal[  ];  Other:  Past Medical History  Diagnosis Date  . CAD (coronary artery disease)   . Atrial fibrillation   . Cardiomyopathy, ischemic   . Heart failure, systolic, acute on chronic   . Hypertension   . Hyperlipidemia   . Pleural effusion   . Positional vertigo   . Dizziness   . GERD (gastroesophageal reflux disease)   . Atypical pneumonia   . Cough   . Allergic rhinitis   . TIA (transient ischemic attack)   . OSA (obstructive sleep apnea)     Home sleep test 07/05/2009 AHI 8.2  . Chronic anticoagulation    Past Surgical History  Procedure Laterality Date  . Coronary stent placement  2004    LAD  . Shoulder surgery     Infusions: . diltiazem (CARDIZEM) infusion 5 mg/hr (05/31/12 1011)    Allergies  Allergen Reactions  . Sulfonamide Derivatives     REACTION: rash   History   Social History  . Marital Status: Married    Spouse Name: N/A    Number of Children: N/A  . Years of Education: N/A   Occupational History  . PHYSICIAN     Psychologist   Social History Main Topics  . Smoking status: Never Smoker   . Smokeless tobacco: Never Used  . Alcohol Use: Yes     Comment: Socially  . Drug Use: No  . Sexually Active: Not on file   Other Topics Concern  . Not on file   Social History Narrative   Married   Gets regular exercise     Family History  Problem Relation Age of Onset  . Adopted: Yes - has no information on biological parents   PHYSICAL EXAM: Filed Vitals:   05/31/12 1300  BP: 117/66  Pulse: 95  Temp:   Resp: 14   General:  Well appearing male. No respiratory difficulty HEENT: normal Neck: supple. Minimal JVD. Carotids 2+ bilat; no bruits. No lymphadenopathy or thryomegaly appreciated. Cor: PMI nondisplaced. Irregular rate & rhythm. No rubs, gallops or murmurs. Lungs: clear bilaterally Abdomen: soft, nontender, nondistended. No hepatosplenomegaly. No bruits or masses. Good bowel sounds. Extremities: no cyanosis, clubbing, rash; Trace edema Neuro: alert & oriented x 3, cranial nerves grossly intact. moves all 4 extremities w/o difficulty. Affect pleasant.  Echo: 09/11/2010 Conclusions -- Left ventricle: LVEF is approximately 50% with hypokinesis of the distal inferior, inferoseptal and apical walls. The cavity size was normal. Wall thickness was increased in a pattern of mild LVH. - Aorta: Mild dilation of the aorta root (40 mm) and ascending aorta (44). - Mitral valve: Mild regurgitation. - Pulmonary arteries: PA peak pressure: 73m Hg (S).  ECG: 31-May-2012 10:14:30  ATRIAL FIBRILLATION ~ ? Atrial activity MULTIPLE PREMATURE COMPLEXES, VENT & SUPRAVEN ~ V and SV complexes w/ short R-R LAD, CONSIDER LEFT ANTERIOR FASCICULAR BLOCK ~ axis(240,-40), S>R II III aVF LVH WITH SECONDARY REPOLARIZATION ABNORMALITY ~ R56L/RISIII/S12R56/S3RL & rep abn ANTERIOR INFARCT, AGE INDETERMINATE ~ Q >329min V2 V3 Vent. rate 83  BPM PR interval * ms QRS duration 118 ms QT/QTc 432/508 ms P-R-T axes * -46 102  Results for orders placed during the hospital encounter of 05/31/12 (from the past 24 hour(s))  CBC WITH DIFFERENTIAL     Status: None   Collection Time    05/31/12 10:30 AM      Result Value Range   WBC 6.9  4.0 - 10.5 K/uL   RBC 5.17  4.22 - 5.81 MIL/uL   Hemoglobin 16.0  13.0 - 17.0 g/dL    HCT 45.8  39.0 - 52.0 %   MCV 88.6  78.0 - 100.0 fL   MCH 30.9  26.0 - 34.0 pg   MCHC 34.9  30.0 - 36.0 g/dL   RDW 13.6  11.5 - 15.5 %   Platelets 165  150 - 400 K/uL   Neutrophils Relative 72  43 - 77 %   Neutro Abs 5.0  1.7 - 7.7 K/uL   Lymphocytes Relative 18  12 - 46 %   Lymphs Abs 1.2  0.7 - 4.0 K/uL   Monocytes Relative 7  3 - 12 %   Monocytes Absolute 0.5  0.1 - 1.0 K/uL   Eosinophils Relative 3  0 - 5 %   Eosinophils Absolute 0.2  0.0 - 0.7 K/uL   Basophils Relative 0  0 - 1 %   Basophils Absolute 0.0  0.0 - 0.1 K/uL  APTT     Status: Abnormal   Collection Time    05/31/12 10:30 AM      Result Value Range   aPTT 39 (*) 24 - 37 seconds  PROTIME-INR     Status: Abnormal   Collection Time    05/31/12 10:30 AM      Result Value Range   Prothrombin Time 22.3 (*) 11.6 - 15.2 seconds   INR 2.05 (*) 0.00 - 1.49  POCT I-STAT TROPONIN I     Status: None   Collection Time    05/31/12 10:39 AM      Result Value Range   Troponin i, poc 0.02  0.00 - 0.08 ng/mL   Comment 3           POCT I-STAT, CHEM 8     Status: Abnormal   Collection Time    05/31/12 10:41 AM      Result Value Range   Sodium 139  135 - 145 mEq/L   Potassium 4.2  3.5 - 5.1 mEq/L   Chloride 104  96 - 112 mEq/L   BUN 15  6 - 23 mg/dL   Creatinine, Ser 0.90  0.50 - 1.35 mg/dL   Glucose, Bld 121 (*) 70 - 99 mg/dL   Calcium, Ion 1.14  1.13 - 1.30 mmol/L   TCO2 24  0 - 100 mmol/L   Hemoglobin 16.3  13.0 - 17.0 g/dL   HCT 48.0  39.0 - 52.0 %   INR  Date/Time Value Range Status  05/31/2012 10:30 AM 2.05* 0.00 - 1.49 Final  05/16/2012  1:15 PM 2.5   Final  05/09/2012 10:57 AM 2.1   Final  05/04/2012 10:13 AM 1.8   Final  05/02/2012  2:29 PM 1.5   Final  03/04/2009  2:09 PM 2.4   Final    Dg Chest Port 1 View 05/31/2012  *RADIOLOGY REPORT*  Clinical Data: Palpitations  PORTABLE CHEST - 1 VIEW  Comparison: Chest radiograph 03/31/2010  Findings: Normal mediastinum and cardiac silhouette.  Normal pulmonary   vasculature.  No evidence  of effusion, infiltrate, or pneumothorax.  No acute bony abnormality.  IMPRESSION: No acute cardiopulmonary process.  The   Original Report Authenticated By: Suzy Bouchard, M.D.    ASSESSMENT: 1. Atrial fibrillation with rapid ventricular response 2.  HYPERTENSION 3.  CARDIOMYOPATHY, ISCHEMIC 4.  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 Ablation has been broached and the patient is currently researching centers in order to decide when/where to have this done. His symptoms are not debilitating as long as his heart rate is controlled. MD advise on admission, cycle enzymes, continue coumadin, add IV Cardizem for rate control and have EP see in am for more definitive plan. Vs d/c Tikosyn, add po Cardizem +/- amiodarone and f/u with [facility]. No recent TSH, will check. Slight volume overload, give Lasix/KDur daily while in afib. Continue coumadin.  Rosaria Ferries, PA-C 05/31/2012 4:12 PM  Patient seen and examined with Rosaria Ferries, PA-C. We discussed all aspects of the encounter. I agree with the assessment and plan as stated above.   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 I have discussed the options with him at length and also have talked to [name]. We discussed inpatient vs outpatient management as well as treatment strategies for his AF.  He is now well rate controlled on low dose IV cardizem (58m/hr) so I think he will do well going home on oral cardizem. We will stop Tikosyn and start cardizem [id number]. He knows to hold this if he is back in NSR and his rate is slow. He will continue his b-blocker. We suggested initiating amiodarone [id number] starting Thursday but he would like to wait to start this until he speaks with Dr. KleCaryl Comes which time they can discuss amiodarone therapy and also catheter ablation again. He does have  mild volume overload and we gave him lasix 20/kcl 20 to use prn.   DanBenay Spice38 PM

## 2012-05-31 NOTE — Telephone Encounter (Signed)
Pt had INR checked in ED today per Suanne Marker the PA pls call pt to rsc as you see fit

## 2012-05-31 NOTE — ED Notes (Signed)
States that he  Could feel his heart thumping last night and took an extra metopolol did not get dizzy till today

## 2012-05-31 NOTE — ED Provider Notes (Signed)
History     CSN: [id number]  Arrival date & time 05/31/12  0944   First MD Initiated Contact with Patient 05/31/12 [phone]      Chief Complaint  Patient presents with  . Dizziness    (Consider location/radiation/quality/duration/timing/severity/associated sxs/prior treatment) HPI Comments: Pt has a long history of atrial fibrillation and coronary disease who takes daily Coumadin, Tikosyn and metoprolol.  He presents after stating that he was feeling palpitations throughout the night, they got worse this morning and are now associated with dizziness and lightheadedness. He has a feeling of near syncope but has not syncopized, he has no chest pain or shortness of breath but feels generally weak. The symptoms get worse when he sits up or stands up, better when he sits down. He does have a cardiologist with whom he follows.  [name].    The patient denies over-the-counter, stimulants, increased caffeine intake or alcohol use  The history is provided by the patient, the spouse and medical records.    Past Medical History  Diagnosis Date  . CAD (coronary artery disease)   . Atrial fibrillation   . Cardiomyopathy, ischemic   . Heart failure, systolic, acute on chronic   . Hypertension   . Hyperlipidemia   . Pleural effusion   . Positional vertigo   . Dizziness   . GERD (gastroesophageal reflux disease)   . Atypical pneumonia   . Cough   . Allergic rhinitis   . TIA (transient ischemic attack)   . OSA (obstructive sleep apnea)     Home sleep test 07/05/2009 AHI 8.2    Past Surgical History  Procedure Laterality Date  . Coronary stent placement  2004    LAD  . Shoulder surgery      Family History  Problem Relation Age of Onset  . Adopted: Yes    History  Substance Use Topics  . Smoking status: Never Smoker   . Smokeless tobacco: Never Used  . Alcohol Use: Yes     Comment: Socially      Review of Systems  All other systems reviewed and are  negative.    Allergies  Sulfonamide derivatives  Home Medications   Current Outpatient Rx  Name  Route  Sig  Dispense  Refill  . dofetilide (TIKOSYN) 500 MCG capsule   Oral   Take 500 mcg by mouth 2 (two) times daily.         . metoprolol succinate (TOPROL-XL) 25 MG 24 hr tablet   Oral   Take 12.5 mg by mouth daily.         . nitroGLYCERIN (NITROSTAT) 0.4 MG SL tablet   Sublingual   Place 0.4 mg under the tongue every 5 (five) minutes as needed. May repeat for up to 3 doses.          . Omega-3 Fatty Acids (THE VERY FINEST FISH OIL) LIQD   Oral   Take 2-3 g by mouth daily.          . valsartan (DIOVAN) 40 MG tablet   Oral   Take 1 tablet (40 mg total) by mouth 2 (two) times daily.   180 tablet   3   . warfarin (COUMADIN) 5 MG tablet   Oral   Take 1 tablet (5 mg total) by mouth daily.   30 tablet   1     BP 123/57  Pulse 87  Temp(Src) 98.3 F (36.8 C)  Resp 12  SpO2 99%  Physical Exam  Nursing  note and vitals reviewed. Constitutional: He appears well-developed and well-nourished. No distress.  HENT:  Head: Normocephalic and atraumatic.  Mouth/Throat: Oropharynx is clear and moist. No oropharyngeal exudate.  Eyes: Conjunctivae and EOM are normal. Pupils are equal, round, and reactive to light. Right eye exhibits no discharge. Left eye exhibits no discharge. No scleral icterus.  Neck: Normal range of motion. Neck supple. No JVD present. No thyromegaly present.  Cardiovascular: Normal heart sounds and intact distal pulses.  Exam reveals no gallop and no friction rub.   No murmur heard. Tachycardia, irregularly irregular, pulses palpable at the radial arteries bilaterally  Pulmonary/Chest: Effort normal and breath sounds normal. No respiratory distress. He has no wheezes. He has no rales.  Abdominal: Soft. Bowel sounds are normal. He exhibits no distension and no mass. There is no tenderness.  Musculoskeletal: Normal range of motion. He exhibits no edema  and no tenderness.  Lymphadenopathy:    He has no cervical adenopathy.  Neurological: He is alert. Coordination normal.  Skin: Skin is warm and dry. No rash noted. No erythema.  Psychiatric: He has a normal mood and affect. His behavior is normal.    ED Course  Procedures (including critical care time)  Labs Reviewed  APTT - Abnormal; Notable for the following:    aPTT 39 (*)    All other components within normal limits  PROTIME-INR - Abnormal; Notable for the following:    Prothrombin Time 22.3 (*)    INR 2.05 (*)    All other components within normal limits  POCT I-STAT, CHEM 8 - Abnormal; Notable for the following:    Glucose, Bld 121 (*)    All other components within normal limits  CBC WITH DIFFERENTIAL  POCT I-STAT TROPONIN I   Dg Chest Port 1 View  05/31/2012  *RADIOLOGY REPORT*  Clinical Data: Palpitations  PORTABLE CHEST - 1 VIEW  Comparison: Chest radiograph 03/31/2010  Findings: Normal mediastinum and cardiac silhouette.  Normal pulmonary  vasculature.  No evidence of effusion, infiltrate, or pneumothorax.  No acute bony abnormality.  IMPRESSION: No acute cardiopulmonary process.  The   Original Report Authenticated By: Suzy Bouchard, M.D.      1. Atrial fibrillation with rapid ventricular response   2. Light headedness       MDM  Overall the patient appears to have atrial fibrillation with rapid ventricular response, on the initial EKG there was evidence consistent with this, 20 mg of Cardizem was given and the patient has had a slowed rate down to the 80s. Will get laboratory workup, chest x-ray discuss with cardiology. Cardizem drip at this time.  ED ECG REPORT  I personally interpreted this EKG   Date: 05/31/2012   Rate: 102  Rhythm: atrial fibrillation and With rapid ventricular response  QRS Axis: left  Intervals: normal  ST/T Wave abnormalities: nonspecific ST/T changes  Conduction Disutrbances:none  Narrative Interpretation:   Old EKG Reviewed:  Compared with prior EKG from 11/20/2011, normal sinus rhythm has been replaced with atrial fibrillation   ED ECG REPORT  I personally interpreted this EKG   Date: 05/31/2012   Rate: 83  Rhythm: atrial fibrillation  QRS Axis: left  Intervals: normal  ST/T Wave abnormalities: nonspecific ST/T changes  Conduction Disutrbances:none  Narrative Interpretation:   Old EKG Reviewed: His last EKG prior to getting Cardizem, ST and T wave abnormalities less pronounced but still present    After receiving Cardizem by IV drip the patient's heart rate is now under control between 80  and 100 beats per minute. He still gets lightheaded when he sits up but after sitting up for approximately 1 minute the symptoms seemed to ease off and go away. He has no focal neurologic deficits on repeat exam, his memory is intact, he interacts appropriately with the examiner and has no focal limb ataxia.  PA and lateral views of the chest were obtained by digital radiography. I have personally interpreted these x-rays and find her to be no signs of pulmonary infiltrate, cardiomegaly, subdiaphragmatic free air, soft tissue abnormality, no obvious bony abnormalities or fractures.  Laboratory data unremarkable, patient stabilizing requiring intravenous continuous drip of Cardizem at this time. I have discussed his care with the cardiology service, they will come to see the patient in the emergency department the      Johnna Acosta, MD 05/31/12 1122

## 2012-05-31 NOTE — ED Notes (Signed)
Dr Zoila Shutter at bedside.

## 2012-06-01 LAB — TSH: TSH: 1.369 u[IU]/mL (ref 0.350–4.500)

## 2012-06-03 ENCOUNTER — Ambulatory Visit (INDEPENDENT_AMBULATORY_CARE_PROVIDER_SITE_OTHER): Payer: Medicare Other | Admitting: Internal Medicine

## 2012-06-03 ENCOUNTER — Encounter: Payer: Self-pay | Admitting: Internal Medicine

## 2012-06-03 ENCOUNTER — Ambulatory Visit: Payer: Medicare Other | Admitting: Internal Medicine

## 2012-06-03 VITALS — BP 142/75 | HR 82 | Ht 70.0 in | Wt 164.4 lb

## 2012-06-03 DIAGNOSIS — I4891 Unspecified atrial fibrillation: Secondary | ICD-10-CM

## 2012-06-03 NOTE — Patient Instructions (Addendum)
Your physician wants you to follow-up in: [place] will receive a reminder letter in the mail two months in advance. If you don't receive a letter, please call our office to schedule the follow-up appointment.   Your physician has requested that you have an echocardiogram. Echocardiography is a painless test that uses sound waves to create images of your heart. It provides your doctor with information about the size and shape of your heart and how well your heart's chambers and valves are working. This procedure takes approximately one hour. There are no restrictions for this procedure.

## 2012-06-03 NOTE — Progress Notes (Signed)
Patient Care Team: Dorena Cookey, MD as PCP - General Elsie Stain, MD (Pulmonary Disease)   HPI  Caleb [name] is a 72 y.o. male Seen in followup for paroxysmal fibrillation i in the context of schemic heart disease with prior MI and LAD stenting  He also has a history of a prior stroke and is on anticoagulation. His last echo showed left atrial dimension of 44 an ejection fraction of about 50% with hypokinesis.  Because of problems with NOACs and rashes he is back on warfarinthe most recent GFR is normal.  He was seen in the emergency room within the last week because of atrial fibrillation with a rapid ventricular response. Dofetilide was discontinued and diltiazem was initiated. He is feeling much better.  .  Most recent Myoview was February 2012 Demonstrating ejection fraction of38 % With Anteroapical hypokinesis  Prior anterior (septal apical) MI And felt to be a low-risk study.  Echo June 2012 Also demonstrated an ejection fraction of 30-35% with mild to moderate mitral regurgitation and mild biatrial enlargement  Cath 2005 oronary artery disease, status post prior anterior wall  myocardial infarction treated with stenting in the left anterior descending  artery October 2004 with 0% stenosis at the stent site in the proximal left  anterior descending artery, 60% narrowing in the mid left anterior  descending artery, and 90% ostial narrowing of a very small diagonal branch,  no major obstruction of the circumflex artery, 30% narrowing in the proximal right coronary artery.    Past Medical History  Diagnosis Date  . CAD (coronary artery disease)   . Atrial fibrillation   . Cardiomyopathy, ischemic   . Heart failure, systolic, acute on chronic   . Hypertension   . Hyperlipidemia   . Pleural effusion   . Positional vertigo   . Dizziness   . GERD (gastroesophageal reflux disease)   . Atypical pneumonia   . Cough   . Allergic rhinitis   . TIA (transient ischemic  attack)   . OSA (obstructive sleep apnea)     Home sleep test 07/05/2009 AHI 8.2  . Chronic anticoagulation     Past Surgical History  Procedure Laterality Date  . Coronary stent placement  2004    LAD  . Shoulder surgery      Current Outpatient Prescriptions  Medication Sig Dispense Refill  . diltiazem (CARDIZEM CD) 180 MG 24 hr capsule Take 1 capsule (180 mg total) by mouth daily.  30 capsule  11  . furosemide (LASIX) 20 MG tablet Take 20 mg by mouth as needed.      . metoprolol succinate (TOPROL-XL) 25 MG 24 hr tablet Take 12.5 mg by mouth daily.      . nitroGLYCERIN (NITROSTAT) 0.4 MG SL tablet Place 0.4 mg under the tongue every 5 (five) minutes as needed. May repeat for up to 3 doses.       . Omega-3 Fatty Acids (THE VERY FINEST FISH OIL) LIQD Take 2-3 g by mouth daily.       . Potassium Chloride ER 20 MEQ TBCR Take 20 mEq by mouth as needed.      . valsartan (DIOVAN) 40 MG tablet Take 1 tablet (40 mg total) by mouth 2 (two) times daily.  180 tablet  3  . warfarin (COUMADIN) 5 MG tablet Take 1 tablet (5 mg total) by mouth daily.  30 tablet  1  . [DISCONTINUED] eplerenone (INSPRA) 25 MG tablet Take 1 tablet (25 mg total) by mouth  daily.  30 tablet  11   No current facility-administered medications for this visit.    Allergies  Allergen Reactions  . Sulfonamide Derivatives     REACTION: rash  . Pradaxa (Dabigatran Etexilate Mesylate) Rash  . Xarelto (Rivaroxaban) Rash    Review of Systems negative except from HPI and PMH  Physical Exam BP 142/75  Pulse 82  Ht _0  (1.778 m)  Wt 164 lb 6.4 oz (74.571 kg)  BMI 23.59 kg/m2 Irregularly irregular rhythm     Assessment and  Plan

## 2012-06-03 NOTE — Assessment & Plan Note (Signed)
The patient has no persistent atrial fibrillation. He has decided to discontinue his dofetilide. He was seen in the emergency room the other day by Dr. Reine Just. Diltiazem was added and he is much better with improved rate control.  He comes in today, and he and his wife and I had a 45 minute discussion regarding treatment options for atrial fibrillation. Specifically, we discussed the role of amiodarone as an interim antiarrhythmic not withstanding his potential for side effects of which the patient and his wife are both very aware. We also discussed catheter ablation. The question for the patient raises his we are should he go and he is looking into various sites to pursue catheter ablation. We also discussed left atrial occlusion.  For now we will continue the current course and obtain an echocardiogram

## 2012-06-06 ENCOUNTER — Telehealth: Payer: Self-pay | Admitting: Internal Medicine

## 2012-06-06 ENCOUNTER — Telehealth: Payer: Self-pay | Admitting: Critical Care Medicine

## 2012-06-06 DIAGNOSIS — G4733 Obstructive sleep apnea (adult) (pediatric): Secondary | ICD-10-CM

## 2012-06-06 NOTE — Telephone Encounter (Signed)
I spoke to the patient  He needs a sleep study at [facility] ordered :  Dx is OSA

## 2012-06-06 NOTE — Telephone Encounter (Signed)
Called, spoke with [name].  States he has a few concerns he would briefly like to speak with Dr. Joya Gaskins about. 1.  States he has been advised to go on amiodarone but would like to speak with PW about the effects of it causing fibrosis of the lungs.   2.  Would like to discuss sleep apnea with PW Pt states he doesn't feel OV is needed.  He would like to personally speak with PW about the above concerns.  Dr. Joya Gaskins, will you pls call him?  Best contact # is [phone].  Thank you.

## 2012-06-06 NOTE — Telephone Encounter (Signed)
LOV,Cardiac Cath,Echo,12 Lead Faxed to [facility]  at [phone]     06/06/12/KM

## 2012-06-07 ENCOUNTER — Ambulatory Visit: Payer: Medicare Other | Admitting: Internal Medicine

## 2012-06-07 NOTE — Telephone Encounter (Signed)
Order placed. Pt aware he will receive a call regarding date, time, and location of sleep study. He verbalized understanding and voiced no further questions or concerns at this time.

## 2012-06-08 ENCOUNTER — Ambulatory Visit (HOSPITAL_COMMUNITY): Payer: Medicare Other | Attending: Internal Medicine | Admitting: Radiology

## 2012-06-08 ENCOUNTER — Ambulatory Visit (INDEPENDENT_AMBULATORY_CARE_PROVIDER_SITE_OTHER): Payer: Medicare Other | Admitting: *Deleted

## 2012-06-08 DIAGNOSIS — I2510 Atherosclerotic heart disease of native coronary artery without angina pectoris: Secondary | ICD-10-CM | POA: Diagnosis not present

## 2012-06-08 DIAGNOSIS — I059 Rheumatic mitral valve disease, unspecified: Secondary | ICD-10-CM | POA: Diagnosis not present

## 2012-06-08 DIAGNOSIS — I079 Rheumatic tricuspid valve disease, unspecified: Secondary | ICD-10-CM | POA: Diagnosis not present

## 2012-06-08 DIAGNOSIS — E785 Hyperlipidemia, unspecified: Secondary | ICD-10-CM | POA: Diagnosis not present

## 2012-06-08 DIAGNOSIS — I4891 Unspecified atrial fibrillation: Secondary | ICD-10-CM | POA: Diagnosis not present

## 2012-06-08 DIAGNOSIS — I2589 Other forms of chronic ischemic heart disease: Secondary | ICD-10-CM | POA: Diagnosis not present

## 2012-06-08 DIAGNOSIS — Z7901 Long term (current) use of anticoagulants: Secondary | ICD-10-CM | POA: Diagnosis not present

## 2012-06-08 DIAGNOSIS — I10 Essential (primary) hypertension: Secondary | ICD-10-CM | POA: Insufficient documentation

## 2012-06-08 DIAGNOSIS — Z8673 Personal history of transient ischemic attack (TIA), and cerebral infarction without residual deficits: Secondary | ICD-10-CM | POA: Insufficient documentation

## 2012-06-08 LAB — POCT INR: INR: 2.7

## 2012-06-08 NOTE — Progress Notes (Signed)
Echocardiogram performed by Victorio Palm.

## 2012-06-09 ENCOUNTER — Telehealth: Payer: Self-pay | Admitting: *Deleted

## 2012-06-09 ENCOUNTER — Telehealth: Payer: Self-pay | Admitting: Internal Medicine

## 2012-06-09 DIAGNOSIS — I4891 Unspecified atrial fibrillation: Secondary | ICD-10-CM

## 2012-06-09 MED ORDER — AMIODARONE HCL 400 MG PO TABS: ORAL_TABLET | ORAL | Status: DC

## 2012-06-09 NOTE — Telephone Encounter (Signed)
Walk In pt form " Pt has Questions about Meds" sent to [facility]" 06/09/12/KM

## 2012-06-09 NOTE — Telephone Encounter (Signed)
I spoke with the patient as he has decided to proceed with amiodarone and DCCV in 2 weeks. Per [name] - start amiodarone 400 mg BID x 1 week, then 400 mg QD for a week then DCCV. The patient is aware. He will start amiodarone on Monday 3/24. I will notify [facility] as I have discussed with the patient that his INR's will need to be followed very closely. I will call him back early next week to set a DCCV date for him. He is agreeable.

## 2012-06-10 DIAGNOSIS — I4891 Unspecified atrial fibrillation: Secondary | ICD-10-CM | POA: Diagnosis not present

## 2012-06-13 ENCOUNTER — Telehealth: Payer: Self-pay | Admitting: Internal Medicine

## 2012-06-13 DIAGNOSIS — I4891 Unspecified atrial fibrillation: Secondary | ICD-10-CM | POA: Insufficient documentation

## 2012-06-13 NOTE — Telephone Encounter (Signed)
New problem   Per Maudie Mercury at [facility] Dr Nettie Elm wants you to call him on his cell @ [phone] whenever you get a chance regarding this patient.  Nothing urgent!

## 2012-06-15 DIAGNOSIS — Z006 Encounter for examination for normal comparison and control in clinical research program: Secondary | ICD-10-CM | POA: Diagnosis not present

## 2012-06-15 DIAGNOSIS — Z0289 Encounter for other administrative examinations: Secondary | ICD-10-CM | POA: Diagnosis not present

## 2012-06-15 DIAGNOSIS — I509 Heart failure, unspecified: Secondary | ICD-10-CM | POA: Diagnosis not present

## 2012-06-16 NOTE — Telephone Encounter (Signed)
I spoke with the patient regarding a call I had received from [facility]- Dr. Koleen Nimrod in cardiology is waiting on a call from [name]. Per the patient, he saw Dr. Koleen Nimrod as a second opinion for his a-fib. His son is a physician out of state and knows Dr. Koleen Nimrod. The patient has not started amiodarone. Dr. Koleen Nimrod wanted to talk to [name] about the possibility of Tikosyn and Ranolazine for treatment of his rhythm. I advised I will make sure [name] is aware to call Dr. Koleen Nimrod when he is back in the office tomorrow. Another phone note is open with the contact information for Dr. Koleen Nimrod. I will close this note.

## 2012-06-16 NOTE — Telephone Encounter (Signed)
Call received from Maudie Mercury at [facility] today to follow up on [name] returning a call to Dr. Koleen Nimrod. Message was never routed. Will forward to [name] to make him aware to please call Dr. Koleen Nimrod at [phone]. Seth Bake made aware message not routed initially.

## 2012-06-17 ENCOUNTER — Other Ambulatory Visit: Payer: Self-pay | Admitting: Internal Medicine

## 2012-06-20 NOTE — Telephone Encounter (Signed)
Follow-up:    Patient called in wanting to follow-up on his previous call.  Please call back.

## 2012-06-21 NOTE — Telephone Encounter (Signed)
Spoke with pt   He will decide re amio vs tikosyn + ranolazine,  He has spoken w Dr Koleen Nimrod at [facility] Amiodarone- if he chooses to start would be @ 400 bid x two weeks then 400 qd and cardiversin about 3 weeks

## 2012-06-22 ENCOUNTER — Ambulatory Visit (INDEPENDENT_AMBULATORY_CARE_PROVIDER_SITE_OTHER): Payer: Medicare Other | Admitting: Nurse Practitioner

## 2012-06-22 ENCOUNTER — Ambulatory Visit (INDEPENDENT_AMBULATORY_CARE_PROVIDER_SITE_OTHER): Payer: Medicare Other | Admitting: *Deleted

## 2012-06-22 ENCOUNTER — Ambulatory Visit: Payer: Medicare Other | Admitting: Internal Medicine

## 2012-06-22 VITALS — BP 138/92 | HR 92 | Resp 18 | Wt 164.0 lb

## 2012-06-22 DIAGNOSIS — Z7901 Long term (current) use of anticoagulants: Secondary | ICD-10-CM | POA: Diagnosis not present

## 2012-06-22 DIAGNOSIS — I4891 Unspecified atrial fibrillation: Secondary | ICD-10-CM

## 2012-06-22 DIAGNOSIS — R079 Chest pain, unspecified: Secondary | ICD-10-CM | POA: Diagnosis not present

## 2012-06-22 LAB — POCT INR: INR: 2.4

## 2012-06-22 NOTE — Progress Notes (Signed)
Patient presents ambulatory from lobby here for EKG after calling to c/o chest discomfort yesterday.  Patient states he recently resumed Coumadin therapy and has an appointment today with [facility].  Patient states that he associates discomfort with Coumadin because the last time he was taking it he had a burning sensation in the iliac crest.  Patient does have some red rash on chest in area of complaint as well as on his arms.  Patient alert & oriented x 4, in no acute distress; denies SOB.  Patient able to speak in clear complete sentences.  12-lead EKG obtained and taken to [name], DOD.  Patient advised that [name] will come by to check on him but is currently with other patients.  Patient taken to [facility] for INR.  Ivin Booty, RN aware to return patient to [facility] and [name] aware that this is where patient will be waiting.   [name] spoke with patient at 78; patient was discharged without incident.

## 2012-06-22 NOTE — Telephone Encounter (Signed)
New problem    C/o chest discomfort last night.  Has  appt with coumadin @ 2 pm  Today. Asking to be seen.

## 2012-06-22 NOTE — Telephone Encounter (Signed)
Spoke with patient who c/o chest discomfort that is more on the top of his skin.  Patient states he spoke with [name] last night and at the time he was not experiencing any of these s/s.  Patient states he associates this with taking Coumadin but would like to get checked out.  Patient informed that he could come in for nurse visit at anytime and that [name] is DOD.  Patient has appt with [facility] at 2pm today and will come in early to see nurse for EKG.  Patient agrees with plan of care

## 2012-06-28 ENCOUNTER — Ambulatory Visit (HOSPITAL_BASED_OUTPATIENT_CLINIC_OR_DEPARTMENT_OTHER): Payer: Medicare Other | Attending: Critical Care Medicine | Admitting: Radiology

## 2012-06-28 VITALS — Ht 70.0 in | Wt 162.0 lb

## 2012-06-28 DIAGNOSIS — G4733 Obstructive sleep apnea (adult) (pediatric): Secondary | ICD-10-CM

## 2012-07-06 DIAGNOSIS — G4710 Hypersomnia, unspecified: Secondary | ICD-10-CM

## 2012-07-06 DIAGNOSIS — G4730 Sleep apnea, unspecified: Secondary | ICD-10-CM | POA: Diagnosis not present

## 2012-07-06 NOTE — Procedures (Signed)
Caleb [name], Caleb [name]               ACCOUNT NO.:  [id number]  MEDICAL RECORD NO.:  [id number]          PATIENT TYPE:  OUT  LOCATION:  SLEEP CENTER                 FACILITY:  [facility]  PHYSICIAN:  Rigoberto Noel, MD      DATE OF BIRTH:  [birth year]  DATE OF STUDY:  06/28/2012                           NOCTURNAL POLYSOMNOGRAM  REFERRING PHYSICIAN:  Burnett Harry. Joya Gaskins, MD, FCCP  INDICATION FOR STUDY:  Caleb [name] is a 72 year old gentleman with atrial fibrillation, and hypertension, and obstructive sleep apnea with excessive daytime somnolence and fatigue.  Of note, a home study in April 2011 had shown an AHI of 80 events per hour.  At the time of this study, he weighed 162 pounds with a height of 5 feet 10 inches, BMI of 23, neck size of 14 inches.  EPWORTH SLEEPINESS SCORE:  8.  MEDICATIONS: This nocturnal polysomnogram was performed with a sleep technologist in attendance.  EEG, EOG, EMG, EKG, and respiratory parameters were recorded.  Sleep stages, arousals, limb movements, and respiratory data were scored according to criteria laid out by the American Academy of Sleep Medicine.  SLEEP ARCHITECTURE:  Lights out was at 10:32 p.m., lights on was at 4:52 a.m.  Total sleep time was 183 minutes with a sleep period time of 305 minute with a sleep latency of 16 minutes.  Wake after sleep onset was 182 minutes.  Sleep stages as the percentage of total sleep time was N1 14%, N2 75%, and REM sleep 12%.  Supine sleep was only noted for 1.5 minutes.  REM sleep was noted around 2:00 a.m.  Long periods of awakening were noted.  AROUSAL DATA:  There were 60 arousals with an arousal index of 20 events per hour.  Of these, 28 were spontaneous and the rest were associated with respiratory events.  RESPIRATORY DATA:  There were 4 obstructive apneas, 10 central apneas, 1 mixed apnea, and 34 hypopneas with an apnea-hypopnea index of 18 events per hour, 21 RERAs were noted with an RDI of 23 events per  hour. Longest hypopnea was 66 seconds and the longest apnea was 34 seconds.  LIMB MOVEMENT DATA:  The limb movement index was 42 events per hour. However, the PLM arousal index was only 0.7 events per hour.  OXYGEN DATA:  The desaturation index was 26 events per hour.  He spent 0 minutes with a saturation less than 88%.  CARDIAC DATA:  The low heart rate was 30 beats per minute.  The high heart rate recorded was an artifact.  No arrhythmias were noted.  MOVEMENT-PARASOMNIA:none noted  DISCUSSION:  Poor ability to maintain sleep but long periods of awakening. Mild desaturation, atrial fibrillation was the rhythm throughout.  IMPRESSIONS-RECOMMENDATIONS: 1. Moderate obstructive sleep apnea with hypopneas causing sleep     fragmentation and mild oxygen desaturation. 2. Few PLMS were noted but these were not associated with arousals. 3. No evidence of cardiac arrhythmias or behavioral disturbance during     sleep.  RECOMMENDATION: 1. The treatment options for this degree of sleep-disordered breathing     include CPAP therapy or oral appliance. 2. He should be cautioned against driving when sleepy.  He should  be     asked to avoid medications with sedative side effects.  The benefit     of treatment of obstructive sleep apnea for atrial fibrillation can     be discussed.     Rigoberto Noel, MD    RVA/MEDQ  D:  07/06/2012 12:25:50  T:  07/06/2012 23:45:37  Job:  [id number]

## 2012-07-07 ENCOUNTER — Telehealth: Payer: Self-pay | Admitting: Pulmonary Disease

## 2012-07-07 NOTE — Telephone Encounter (Signed)
Dr. Elsworth Soho please advise thanks

## 2012-07-07 NOTE — Telephone Encounter (Signed)
Next aval for RA is may 19.  Is this ok for the pt to wait this long.  mindy please advise. thanks

## 2012-07-07 NOTE — Telephone Encounter (Signed)
Ok to [place] in 2-3 weeks

## 2012-07-07 NOTE — Telephone Encounter (Signed)
Pt needs OV w/ RA to discuss sleep study.

## 2012-07-11 NOTE — Telephone Encounter (Signed)
Spoke with patient, patient has been scheduled to see Dr. Isaias Sakai. May 9,2014 at 1145am. Patient also requesting sleep study be sent to his cardiologist at [facility] : Dr. Nettie Elm _0 .[phone]. This has also been done. Nothing further needed at this time.

## 2012-07-13 ENCOUNTER — Telehealth: Payer: Self-pay | Admitting: Critical Care Medicine

## 2012-07-13 DIAGNOSIS — I4891 Unspecified atrial fibrillation: Secondary | ICD-10-CM | POA: Diagnosis not present

## 2012-07-13 DIAGNOSIS — G4733 Obstructive sleep apnea (adult) (pediatric): Secondary | ICD-10-CM | POA: Insufficient documentation

## 2012-07-13 DIAGNOSIS — I499 Cardiac arrhythmia, unspecified: Secondary | ICD-10-CM | POA: Diagnosis not present

## 2012-07-13 NOTE — Telephone Encounter (Signed)
Sleep study shows moderate sleep apnea  He needs Cpap set up, order sent to [facility] Needs ov with me soon  I left msg on his phone re results, call him again

## 2012-07-13 NOTE — Telephone Encounter (Signed)
Order given to [facility]

## 2012-07-14 ENCOUNTER — Ambulatory Visit (INDEPENDENT_AMBULATORY_CARE_PROVIDER_SITE_OTHER): Payer: Medicare Other | Admitting: *Deleted

## 2012-07-14 DIAGNOSIS — I4891 Unspecified atrial fibrillation: Secondary | ICD-10-CM

## 2012-07-14 DIAGNOSIS — Z7901 Long term (current) use of anticoagulants: Secondary | ICD-10-CM

## 2012-07-14 NOTE — Telephone Encounter (Addendum)
Called, spoke with pt.   Informed him of below per PW. He verbalized understanding of this and did receive PW's VM. He is aware [facility] will be contacting him to set up Cpap. We have scheduled him to see PW on Tuesday, May 6 at 9 am at [facility] office -- he is aware of appt date, time, and location.  Per phone msg from 07/07/12:  Inge Rise, CMA at 07/07/2012 4:08 PM   Status: Signed            Pt needs OV w/ RA to discuss sleep study.  ---------- He has OV scheduled with RA on May 9.  He would like to know if PW thinks it is necessary he keep this appt.  Dr. Joya Gaskins, pls advise.  Thank you!

## 2012-07-14 NOTE — Telephone Encounter (Signed)
noted 

## 2012-07-25 ENCOUNTER — Telehealth: Payer: Self-pay | Admitting: Critical Care Medicine

## 2012-07-25 NOTE — Telephone Encounter (Signed)
lmomtcb x1 for melissa 

## 2012-07-26 ENCOUNTER — Encounter: Payer: Self-pay | Admitting: Critical Care Medicine

## 2012-07-26 ENCOUNTER — Ambulatory Visit (INDEPENDENT_AMBULATORY_CARE_PROVIDER_SITE_OTHER): Payer: Medicare Other | Admitting: Critical Care Medicine

## 2012-07-26 VITALS — BP 132/72 | HR 80 | Temp 97.5°F | Ht 70.0 in | Wt 167.0 lb

## 2012-07-26 DIAGNOSIS — G4733 Obstructive sleep apnea (adult) (pediatric): Secondary | ICD-10-CM

## 2012-07-26 NOTE — Telephone Encounter (Signed)
LM with Melissa to cancel the order for CPAP per PW.

## 2012-07-26 NOTE — Patient Instructions (Addendum)
Consider oral appliance,  Oneal Grout or Nicoletta Ba.  Also in [facility] oral surgeon,  Michail Jewels  No change in meds. Return as needed Once we get your new appliance will need to repeat sleeps study

## 2012-07-26 NOTE — Telephone Encounter (Signed)
ATC Melissa, no answer LMOMTCB

## 2012-07-26 NOTE — Assessment & Plan Note (Signed)
Obstructive sleep apnea to a moderate degree with desaturations and also of apnea hypopnea index of 80 in a patient with associated atrial fibrillation Plan We discussed the pros and cons of CPAP therapy versus an oral appliance and the patient prefers to go the direction of an oral appliance. Note the patient had been that study in 2011 and seen by a prior sleep physician who also recommended an  oral appliance.  The patient appears to be more it shouldn't pursuing oral appliance at this time Referrals to oral surgeon to does oral appliance devices was given to the patient several different providers names and phone numbers were given to the patient will followup on this Once the oral appliance is devised the patient would need a followup sleep study

## 2012-07-26 NOTE — Progress Notes (Signed)
Subjective:    Patient ID: Caleb [name], male    DOB: [birth year], 72 y.o.   MRN: [id number]  HPI 72 y.o.WM 07/26/2012 This is a 72 year old white male who has been seen previously for pleural effusions. The patient also has a history of sleep apnea to a moderate degree with associated mild desaturations. The patient's sleep study in 2011 which demonstrated at apnea hypotony index of 18. At that time an oral appliance was recommended to the patient is not followup on this. The patient returns today to discuss this issue further and had a followup sleep study done prior to this visit.  The patient goes to bed around 10:30 PM every night and awakens after 7 AM. The patient denies any restlessness at night. He denies any significant daytime hypersomnolence. The main driver for the obtaining sleep study had to do with the patient's atrial fibrillation issues. Pt denies any significant sore throat, nasal congestion or excess secretions, fever, chills, sweats, unintended weight loss, pleurtic or exertional chest pain, orthopnea PND, or leg swelling Pt denies any increase in rescue therapy over baseline, denies waking up needing it or having any early am or nocturnal exacerbations of coughing/wheezing/or dyspnea. Pt also denies any obvious fluctuation in symptoms with  weather or environmental change or other alleviating or aggravating factors  Past Medical History  Diagnosis Date  . CAD (coronary artery disease)   . Atrial fibrillation   . Cardiomyopathy, ischemic   . Heart failure, systolic, acute on chronic   . Hypertension   . Hyperlipidemia   . Pleural effusion   . Positional vertigo   . Dizziness   . GERD (gastroesophageal reflux disease)   . Atypical pneumonia   . Cough   . Allergic rhinitis   . TIA (transient ischemic attack)   . OSA (obstructive sleep apnea)     Home sleep test 07/05/2009 AHI 8.2  . Chronic anticoagulation      Family History  Problem Relation Age of Onset  .  Adopted: Yes     History   Social History  . Marital Status: Married    Spouse Name: N/A    Number of Children: N/A  . Years of Education: N/A   Occupational History  . PHYSICIAN     Psychologist   Social History Main Topics  . Smoking status: Never Smoker   . Smokeless tobacco: Never Used  . Alcohol Use: Yes     Comment: Socially  . Drug Use: No  . Sexually Active: Not on file   Other Topics Concern  . Not on file   Social History Narrative   Married   Gets regular exercise     Allergies  Allergen Reactions  . Sulfonamide Derivatives     REACTION: rash  . Pradaxa (Dabigatran Etexilate Mesylate) Rash  . Xarelto (Rivaroxaban) Rash     Outpatient Prescriptions Prior to Visit  Medication Sig Dispense Refill  . COUMADIN 5 MG tablet Take as directed by coumadn clinic  40 tablet  1  . diltiazem (CARDIZEM CD) 180 MG 24 hr capsule Take 1 capsule (180 mg total) by mouth daily.  30 capsule  11  . furosemide (LASIX) 20 MG tablet Take 20 mg by mouth as needed.      . metoprolol succinate (TOPROL-XL) 25 MG 24 hr tablet Take 12.5 mg by mouth daily.      . nitroGLYCERIN (NITROSTAT) 0.4 MG SL tablet Place 0.4 mg under the tongue every 5 (five) minutes as needed. May  repeat for up to 3 doses.       . Omega-3 Fatty Acids (THE VERY FINEST FISH OIL) LIQD Take 2-3 g by mouth daily.       . Potassium Chloride ER 20 MEQ TBCR Take 20 mEq by mouth as needed.      . valsartan (DIOVAN) 40 MG tablet Take 1 tablet (40 mg total) by mouth 2 (two) times daily.  180 tablet  3  . amiodarone (PACERONE) 400 MG tablet Take one tablet by mouth twice daily for 1 week, then decrease to one tablet by mouth daily for 1 week.  30 tablet  0   No facility-administered medications prior to visit.         Review of Systems Constitutional:   No  weight loss, night sweats,  Fevers, chills, fatigue, lassitude. HEENT:   No headaches,  Difficulty swallowing,  Tooth/dental problems,  Sore throat,                 No sneezing, itching, ear ache, nasal congestion, post nasal drip,   CV:  No chest pain,  Orthopnea, PND, swelling in lower extremities, anasarca, dizziness, palpitations  GI  No heartburn, indigestion, abdominal pain, nausea, vomiting, diarrhea, change in bowel habits, loss of appetite  Resp: ++ shortness of breath with exertion not at rest.  No excess mucus, no productive cough,  No non-productive cough,  No coughing up of blood.  No change in color of mucus.  No wheezing.  No chest wall deformity  Skin: no rash or lesions.  GU: no dysuria, change in color of urine, no urgency or frequency.  No flank pain.  MS:  No joint pain or swelling.  No decreased range of motion.  No back pain.  Psych:  No change in mood or affect. No depression or anxiety.  No memory loss.     Objective:   Physical Exam Filed Vitals:   07/26/12 0851  BP: 132/72  Pulse: 80  Temp: 97.5 F (36.4 C)  TempSrc: Oral  Height: _0  (1.778 m)  Weight: 167 lb (75.751 kg)  SpO2: 99%    Gen: Pleasant, well-nourished, in no distress,  normal affect  ENT: No lesions,  mouth clear,  oropharynx clear, no postnasal drip  Neck: No JVD, no TMG, no carotid bruits  Lungs: No use of accessory muscles, no dullness to percussion, clear without rales or rhonchi  Cardiovascular: irreg irreg  heart sounds normal, no murmur or gallops, no peripheral edema  Abdomen: soft and NT, no HSM,  BS normal  Musculoskeletal: No deformities, no cyanosis or clubbing  Neuro: alert, non focal  Skin: Warm, no lesions or rashes  Sleep study reviewed: revealed AHI 80 mild desaturation, no RLSM.         Assessment & Plan:   OSA (obstructive sleep apnea) Obstructive sleep apnea to a moderate degree with desaturations and also of apnea hypopnea index of 80 in a patient with associated atrial fibrillation Plan We discussed the pros and cons of CPAP therapy versus an oral appliance and the patient prefers to go the direction  of an oral appliance. Note the patient had been that study in 2011 and seen by a prior sleep physician who also recommended an  oral appliance.  The patient appears to be more it shouldn't pursuing oral appliance at this time Referrals to oral surgeon to does oral appliance devices was given to the patient several different providers names and phone numbers were given to the  patient will followup on this Once the oral appliance is devised the patient would need a followup sleep study   Updated Medication List Outpatient Encounter Prescriptions as of 07/26/2012  Medication Sig Dispense Refill  . COUMADIN 5 MG tablet Take as directed by coumadn clinic  40 tablet  1  . diltiazem (CARDIZEM CD) 180 MG 24 hr capsule Take 1 capsule (180 mg total) by mouth daily.  30 capsule  11  . furosemide (LASIX) 20 MG tablet Take 20 mg by mouth as needed.      . metoprolol succinate (TOPROL-XL) 25 MG 24 hr tablet Take 12.5 mg by mouth daily.      . nitroGLYCERIN (NITROSTAT) 0.4 MG SL tablet Place 0.4 mg under the tongue every 5 (five) minutes as needed. May repeat for up to 3 doses.       . Omega-3 Fatty Acids (THE VERY FINEST FISH OIL) LIQD Take 2-3 g by mouth daily.       . Potassium Chloride ER 20 MEQ TBCR Take 20 mEq by mouth as needed.      . valsartan (DIOVAN) 40 MG tablet Take 1 tablet (40 mg total) by mouth 2 (two) times daily.  180 tablet  3  . [DISCONTINUED] amiodarone (PACERONE) 400 MG tablet Take one tablet by mouth twice daily for 1 week, then decrease to one tablet by mouth daily for 1 week.  30 tablet  0   No facility-administered encounter medications on file as of 07/26/2012.

## 2012-07-26 NOTE — Telephone Encounter (Signed)
Spoke with Melissa at [facility]-- She states that because patient did not have OV PRIOR to sleep study his machine will not be covered. According to [facility] --per patients insurance patient would need OV first to discuss the "problem" then do the study and then order the supplies. Dr. Joya Gaskins please advise, thank you.

## 2012-07-26 NOTE — Telephone Encounter (Signed)
I really have never heard of this [facility] order  I will either use another DME supplier or go a different direction

## 2012-07-29 ENCOUNTER — Ambulatory Visit: Payer: Medicare Other | Admitting: Pulmonary Disease

## 2012-08-05 ENCOUNTER — Telehealth: Payer: Self-pay | Admitting: Internal Medicine

## 2012-08-05 ENCOUNTER — Ambulatory Visit (INDEPENDENT_AMBULATORY_CARE_PROVIDER_SITE_OTHER): Payer: Medicare Other | Admitting: *Deleted

## 2012-08-05 DIAGNOSIS — Z7901 Long term (current) use of anticoagulants: Secondary | ICD-10-CM

## 2012-08-05 DIAGNOSIS — I4891 Unspecified atrial fibrillation: Secondary | ICD-10-CM

## 2012-08-05 LAB — POCT INR: INR: 2.3

## 2012-08-05 NOTE — Telephone Encounter (Signed)
Kim prepared and left instructions on 5.14.2014 concerning patient records. The patient picked up two rubber banded folders of records during his coumadin visit today.

## 2012-08-09 DIAGNOSIS — Z7901 Long term (current) use of anticoagulants: Secondary | ICD-10-CM | POA: Diagnosis not present

## 2012-08-09 DIAGNOSIS — Z8673 Personal history of transient ischemic attack (TIA), and cerebral infarction without residual deficits: Secondary | ICD-10-CM | POA: Diagnosis not present

## 2012-08-09 DIAGNOSIS — I10 Essential (primary) hypertension: Secondary | ICD-10-CM | POA: Diagnosis present

## 2012-08-09 DIAGNOSIS — E785 Hyperlipidemia, unspecified: Secondary | ICD-10-CM | POA: Diagnosis present

## 2012-08-09 DIAGNOSIS — I4892 Unspecified atrial flutter: Secondary | ICD-10-CM | POA: Diagnosis not present

## 2012-08-09 DIAGNOSIS — I509 Heart failure, unspecified: Secondary | ICD-10-CM | POA: Diagnosis present

## 2012-08-09 DIAGNOSIS — I252 Old myocardial infarction: Secondary | ICD-10-CM | POA: Diagnosis not present

## 2012-08-09 DIAGNOSIS — I4891 Unspecified atrial fibrillation: Secondary | ICD-10-CM | POA: Diagnosis not present

## 2012-08-09 DIAGNOSIS — I2510 Atherosclerotic heart disease of native coronary artery without angina pectoris: Secondary | ICD-10-CM | POA: Diagnosis present

## 2012-08-09 DIAGNOSIS — I440 Atrioventricular block, first degree: Secondary | ICD-10-CM | POA: Diagnosis not present

## 2012-08-09 DIAGNOSIS — Z5181 Encounter for therapeutic drug level monitoring: Secondary | ICD-10-CM | POA: Diagnosis not present

## 2012-08-18 ENCOUNTER — Ambulatory Visit: Payer: Medicare Other | Admitting: Family Medicine

## 2012-08-18 DIAGNOSIS — Z7901 Long term (current) use of anticoagulants: Secondary | ICD-10-CM | POA: Diagnosis not present

## 2012-08-18 DIAGNOSIS — I4891 Unspecified atrial fibrillation: Secondary | ICD-10-CM | POA: Diagnosis not present

## 2012-08-18 DIAGNOSIS — I440 Atrioventricular block, first degree: Secondary | ICD-10-CM | POA: Diagnosis not present

## 2012-08-24 ENCOUNTER — Encounter: Payer: Self-pay | Admitting: Family Medicine

## 2012-08-24 ENCOUNTER — Ambulatory Visit (INDEPENDENT_AMBULATORY_CARE_PROVIDER_SITE_OTHER): Payer: Medicare Other | Admitting: Family Medicine

## 2012-08-24 VITALS — BP 120/70 | Temp 97.7°F | Wt 161.0 lb

## 2012-08-24 DIAGNOSIS — I4891 Unspecified atrial fibrillation: Secondary | ICD-10-CM

## 2012-08-24 DIAGNOSIS — I499 Cardiac arrhythmia, unspecified: Secondary | ICD-10-CM

## 2012-08-24 NOTE — Patient Instructions (Signed)
You might consider taking the Cardizem twice a day if you have breakthrough AF  Return when necessary

## 2012-08-24 NOTE — Progress Notes (Signed)
  Subjective:    Patient ID: Caleb [name], male    DOB: 12-03-1940, 72 y.o.   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 He did have some ischemic cardiomyopathy however he's under are well. He's had a history of atrial fib details of which are in his cardiac note. He's currently on Cardizem 180 mg daily, Lasix 20 mg when necessary for fluid retention from the Cardizem, Toprol 12.5 mg twice a day, Diovan 20 mg twice a day, Ranexa 500 mg twice a day and Tikosyn twice a day. He's currently followed at [facility] where he said his ablations  He says he went into atrial fib over the weekend. He called and came in today. Just before going into the exam room his rhythm went back into sinus. No chest pain   Review of Systems Review of systems otherwise negative    Objective:   Physical Exam Well-developed well-nourished male in no acute distress vital signs stable afebrile BP 120/70 pulse now 70 and regular  Cardiopulmonary exam normal  EKG normal sinus rhythm       Assessment & Plan:  Atrial fibrillation with spontaneous conversion plan continue current therapy however I would recommend he take a Cardizem twice a day if he has a breakthrough AF

## 2012-08-25 ENCOUNTER — Telehealth: Payer: Self-pay | Admitting: *Deleted

## 2012-08-25 DIAGNOSIS — R079 Chest pain, unspecified: Secondary | ICD-10-CM | POA: Diagnosis not present

## 2012-08-25 DIAGNOSIS — I259 Chronic ischemic heart disease, unspecified: Secondary | ICD-10-CM | POA: Diagnosis not present

## 2012-08-25 DIAGNOSIS — I369 Nonrheumatic tricuspid valve disorder, unspecified: Secondary | ICD-10-CM | POA: Diagnosis not present

## 2012-08-25 DIAGNOSIS — I5042 Chronic combined systolic (congestive) and diastolic (congestive) heart failure: Secondary | ICD-10-CM | POA: Diagnosis not present

## 2012-08-25 NOTE — Telephone Encounter (Signed)
Closed in error. Routing.

## 2012-08-25 NOTE — Telephone Encounter (Signed)
Call received from the patient today. He states he was at [facility] today for a stress test with Dr. Koleen Nimrod, but this was cancelled due to a low EF. He reports that recommendations were made for a heart cath to be done next week, but he has not decided yet to do this. He would like [name] to be in the loop. He states information will be sent from Dr. Hans Eden office and he would like [name] to review and contact him once received. I explained I will be looking out for information from Dr. Koleen Nimrod and have [name] review and call him. He is agreeable.

## 2012-08-29 ENCOUNTER — Telehealth: Payer: Self-pay | Admitting: *Deleted

## 2012-08-29 NOTE — Telephone Encounter (Signed)
Patient called in to advise that he was seen at [facility] by Dr. Koleen Nimrod.   He states he had a stress echo that was abnormal and a heart cath was advised. Patient states that Dr. Koleen Nimrod will be in touch with Dr Caryl Comes. He has mentioned that he wants Dr. Burt Knack to perform his heart cath. Will wait to hear from [name] for heart cath order.

## 2012-08-29 NOTE — Telephone Encounter (Signed)
Received call from [place]. He has advised me that the patient had an echo because of symptoms of SOB and the EF is down to 25 / 30%. Dr.Henderson is not sure if this has occurred because of atrial fib or progression of coronary disease. He would like Dr.Klein to decide if the patient needs a heart cath. I asked him to fax the echo report to this department and then it will be given to [place] for review tomorrow.

## 2012-08-30 NOTE — Telephone Encounter (Signed)
Echo reviewed by [name]. Most likely will need cath. Schedule follow up with him to discuss and schedule. I have discussed this with the patient and he will see [name] tomorrow at 9:00 am.

## 2012-08-31 ENCOUNTER — Encounter: Payer: Self-pay | Admitting: Internal Medicine

## 2012-08-31 ENCOUNTER — Ambulatory Visit (INDEPENDENT_AMBULATORY_CARE_PROVIDER_SITE_OTHER): Payer: Medicare Other | Admitting: Internal Medicine

## 2012-08-31 ENCOUNTER — Ambulatory Visit (INDEPENDENT_AMBULATORY_CARE_PROVIDER_SITE_OTHER): Payer: Medicare Other | Admitting: *Deleted

## 2012-08-31 VITALS — BP 124/56 | HR 57 | Ht 70.0 in | Wt 160.0 lb

## 2012-08-31 DIAGNOSIS — I2510 Atherosclerotic heart disease of native coronary artery without angina pectoris: Secondary | ICD-10-CM | POA: Diagnosis not present

## 2012-08-31 DIAGNOSIS — I4891 Unspecified atrial fibrillation: Secondary | ICD-10-CM

## 2012-08-31 DIAGNOSIS — Z7901 Long term (current) use of anticoagulants: Secondary | ICD-10-CM | POA: Diagnosis not present

## 2012-08-31 LAB — CBC WITH DIFFERENTIAL/PLATELET
Basophils Relative: 0.6 % (ref 0.0–3.0)
HCT: 45.8 % (ref 39.0–52.0)
Hemoglobin: 15.4 g/dL (ref 13.0–17.0)
Lymphocytes Relative: 22.4 % (ref 12.0–46.0)
Lymphs Abs: 1.4 10*3/uL (ref 0.7–4.0)
MCHC: 33.6 g/dL (ref 30.0–36.0)
Monocytes Relative: 9.6 % (ref 3.0–12.0)
Neutro Abs: 4 10*3/uL (ref 1.4–7.7)
RBC: 5.12 Mil/uL (ref 4.22–5.81)

## 2012-08-31 LAB — POCT INR: INR: 2.5

## 2012-08-31 LAB — BASIC METABOLIC PANEL
CO2: 31 mEq/L (ref 19–32)
Calcium: 9.5 mg/dL (ref 8.4–10.5)
Potassium: 4.4 mEq/L (ref 3.5–5.1)
Sodium: 134 mEq/L — ABNORMAL LOW (ref 135–145)

## 2012-08-31 MED ORDER — METOPROLOL SUCCINATE ER 25 MG PO TB24: 25.0000 mg | ORAL_TABLET | Freq: Two times a day (BID) | ORAL | Status: DC

## 2012-08-31 MED ORDER — ENOXAPARIN SODIUM 100 MG/ML ~~LOC~~ SOLN: 100.0000 mg | Freq: Every day | SUBCUTANEOUS | Status: DC

## 2012-08-31 NOTE — Progress Notes (Signed)
Patient Care Team: Dorena Cookey, MD as PCP - General Elsie Stain, MD (Pulmonary Disease)   HPI  Caleb [name] is a 72 y.o. male Seen in followup for paroxysmal fibrillation i in the context of schemic heart disease with prior MI and LAD stenting  He also has a history of a prior stroke and is on anticoagulation    Because of problems with NOACs and rashes he is back on warfarinthe most recent GFR is normal.  He was seen in the emergency room Feb 2014 because of atrial fibrillation with a rapid ventricular response. Diltiazem was initiated.  Ranexa was started by Dr. Koleen Nimrod at [facility] in hopes of augmenting the antiarrhythmic benefit of dofetilide  e; this appears to have been effective at least in part.  He saw Dr. Koleen Nimrod recently with complaints of worsening shortness of breath. Echocardiogram demonstrated worsening of his MR from mild>>-moderate-severe; TR was also noted. Left atrial dimensions with a 2.8 range of 2.4 range an ejection fraction was in the 25-30% range which was relatively stable. With his known history of coronary disease and the associated pressure taking with his dyspnea catheterization was recommended and is here today to discuss it.  Hospital records reviewed    .    Past Medical History  Diagnosis Date  . CAD (coronary artery disease)   . Atrial fibrillation   . Cardiomyopathy, ischemic   . Heart failure, systolic, acute on chronic   . Hypertension   . Hyperlipidemia   . Pleural effusion   . Positional vertigo   . Dizziness   . GERD (gastroesophageal reflux disease)   . Atypical pneumonia   . Cough   . Allergic rhinitis   . TIA (transient ischemic attack)   . OSA (obstructive sleep apnea)     Home sleep test 07/05/2009 AHI 8.2  . Chronic anticoagulation     Past Surgical History  Procedure Laterality Date  . Coronary stent placement  2004    LAD  . Shoulder surgery      Current Outpatient Prescriptions  Medication Sig  Dispense Refill  . COUMADIN 5 MG tablet Take as directed by coumadn clinic  40 tablet  1  . dofetilide (TIKOSYN) 125 MCG capsule Take 125 mcg by mouth 2 (two) times daily.      . furosemide (LASIX) 20 MG tablet Take 20 mg by mouth as needed.      . metoprolol succinate (TOPROL-XL) 25 MG 24 hr tablet Take 12.5 mg by mouth daily.      . nitroGLYCERIN (NITROSTAT) 0.4 MG SL tablet Place 0.4 mg under the tongue every 5 (five) minutes as needed. May repeat for up to 3 doses.       . Omega-3 Fatty Acids (THE VERY FINEST FISH OIL) LIQD Take 2-3 g by mouth daily.       . Potassium Chloride ER 20 MEQ TBCR Take 20 mEq by mouth as needed.      Marland Kitchen RANEXA 500 MG 12 hr tablet Take 500 mg by mouth 2 (two) times daily.       . valsartan (DIOVAN) 40 MG tablet Take 1 tablet (40 mg total) by mouth 2 (two) times daily.  180 tablet  3  . [DISCONTINUED] eplerenone (INSPRA) 25 MG tablet Take 1 tablet (25 mg total) by mouth daily.  30 tablet  11   No current facility-administered medications for this visit.    Allergies  Allergen Reactions  . Sulfonamide Derivatives  REACTION: rash  . Pradaxa (Dabigatran Etexilate Mesylate) Rash  . Xarelto (Rivaroxaban) Rash    Review of Systems negative except from HPI and PMH  Physical Exam BP 124/56  Pulse 57  Ht _0  (1.778 m)  Wt 160 lb (72.576 kg)  BMI 22.96 kg/m2 Well developed and well nourished in no acute distress HENT normal E scleral and icterus clear Neck Supple JVP 6 or so without V waves carotids brisk and full Clear to ausculation  Regular rate and rhythm, 2/6 murmur at the apex Soft with active bowel sounds No clubbing cyanosis none Edema Alert and oriented, grossly normal motor and sensory function Skin Warm and Dry  ECG done last week demonstrated sinus rhythm at 61 with intervals 24/13/47 and occasional PAC nonspecific ST segment flattening  Assessment and  Plan

## 2012-08-31 NOTE — Patient Instructions (Addendum)
**Note De-Identified  Obfuscation** Your physician has requested that you have a cardiac catheterization. Cardiac catheterization is used to diagnose and/or treat various heart conditions. Doctors may recommend this procedure for a number of different reasons. The most common reason is to evaluate chest pain. Chest pain can be a symptom of coronary artery disease (CAD), and cardiac catheterization can show whether plaque is narrowing or blocking your heart's arteries. This procedure is also used to evaluate the valves, as well as measure the blood flow and oxygen levels in different parts of your heart. For further information please visit [email or web address]. Please follow instruction sheet, as given.  Your physician recommends that you return for lab work in: today  Your physician has recommended you make the following change in your medication: stop taking Diltiazem and increase Metoprolol to 25 mg twice daily  Your physician recommends that you schedule a follow-up appointment in: after cath

## 2012-08-31 NOTE — Assessment & Plan Note (Signed)
The patient has symptoms of heart failure with echocardiographic evidence of worsening mitral regurgitation borderline changes in left ventricular function which may be masking worsening LVEF associated with mitral regurgitation as well as progressive left atrial enlargement. There has been accompanying chest tightness and catheterization is indicated. Would undertake right and left heart catheterization. He would need heparin bridging it is a prior stroke.  I would like to set this up with Dr. Sherren Mocha so as to get his input also in the mitral regurgitation which may need further assessment following catheterization.  His diltiazem may be further worsening LV function. We will stop it and increase his metoprolol from 25 mg a day in divided doses to 50 mg a day.  And also resume his diuretic at home at 20 mg a day.

## 2012-08-31 NOTE — Assessment & Plan Note (Signed)
Paroxysmal atrial fibrillation on dofetilide and Ranexa QT interval is stable

## 2012-08-31 NOTE — Assessment & Plan Note (Signed)
As above. We'll plan for catheterization during

## 2012-08-31 NOTE — Patient Instructions (Signed)
09/06/2012 last day to take coumadin 09/07/2012 no coumadin no lovenox 09/08/2012  Lovenox 100 mg 8am 09/09/2012 Lovenox 148m 8am 09/10/2012 Lovenox 100 mg 8am 09/11/2012 Lovenox 1057m8am  09/12/2012 day of procedure no Lovenox then after procedure and instructed by MD doing procedure to restart coumadin and lovenox take extra 1/2 tablet of coumadin for 2 days and continue same dose of Lovenox and continue Lovenox and Coumadin until seen in clinic June 26th

## 2012-09-02 ENCOUNTER — Telehealth: Payer: Self-pay | Admitting: Cardiology

## 2012-09-02 NOTE — Telephone Encounter (Signed)
Received a phone call from the answering service while on call Friday evening from [name]. He is a patient of [name], just recently seen on 6/11. He called to report that he has felt as if his heart has been in and out of atrial fibrillation recently, and he has noticed that his heart rate has been in the 50s fairly consistently since this morning. He has been on Tikosyn and Ranexa - managed also by Dr. Koleen Nimrod at [facility]. At the recent office visit with [name], diltiazem CD 180 mg daily was discontinued, and Toprol-XL was increased from 25 mg twice daily.  He reports no hypotension during this time, systolic blood pressures in the 130s to 140s. Felt mildly dizzy but otherwise no major symptomatology. Of note, he is also to undergo right and left heart catheterization a week from now for further investigation of worsening cardiomyopathy and mitral regurgitation.  I recommended that [name] not take the second dose of Toprol-XL this evening (actually he only took Toprol-XL 12.5 mg this morning), continue to monitor his heart rate and blood pressure, consider coming in to the ER for an ECG this evening if symptoms persist, mainly to exclude any other arrhythmias (heart block) that might need to be managed differently. If on the other hand, his heart rate increases to baseline and symptoms resolve, he could consider coming in for an ECG in the office on Monday, and otherwise reducing Toprol-XL back to once daily dosing, perhaps even 12.5 mg once daily. Otherwise he will continue Tikosyn and Ranexa for now.  Incidentally, Ranexa side effect profile does list bradycardia, question whether this might also be contributing since this is a relatively new medication, although I have not specifically seen this.  He voiced comfort with this plan, can certainly also call us back as needed.

## 2012-09-08 ENCOUNTER — Telehealth: Payer: Self-pay | Admitting: Cardiology

## 2012-09-08 NOTE — Telephone Encounter (Signed)
Pt aware Echo ( [facility]Echo)  will be Left at [facility] for Him to [place] on 09/15/12.

## 2012-09-09 ENCOUNTER — Other Ambulatory Visit: Payer: Self-pay | Admitting: Internal Medicine

## 2012-09-12 ENCOUNTER — Encounter (HOSPITAL_BASED_OUTPATIENT_CLINIC_OR_DEPARTMENT_OTHER)
Admission: RE | Disposition: A | Discharge status: Home / Self Care | Payer: Self-pay | Source: Ambulatory Visit | Attending: Cardiovascular Disease

## 2012-09-12 ENCOUNTER — Inpatient Hospital Stay (HOSPITAL_BASED_OUTPATIENT_CLINIC_OR_DEPARTMENT_OTHER)
Admission: RE | Admit: 2012-09-12 | Discharge: 2012-09-12 | Disposition: A | Discharge status: Home / Self Care | Payer: Medicare Other | Source: Ambulatory Visit | Attending: Cardiovascular Disease | Admitting: Cardiovascular Disease

## 2012-09-12 ENCOUNTER — Ambulatory Visit (INDEPENDENT_AMBULATORY_CARE_PROVIDER_SITE_OTHER): Payer: Medicare Other | Admitting: *Deleted

## 2012-09-12 DIAGNOSIS — Z7901 Long term (current) use of anticoagulants: Secondary | ICD-10-CM | POA: Diagnosis not present

## 2012-09-12 DIAGNOSIS — I4891 Unspecified atrial fibrillation: Secondary | ICD-10-CM

## 2012-09-12 DIAGNOSIS — I2589 Other forms of chronic ischemic heart disease: Secondary | ICD-10-CM | POA: Insufficient documentation

## 2012-09-12 DIAGNOSIS — I2510 Atherosclerotic heart disease of native coronary artery without angina pectoris: Secondary | ICD-10-CM | POA: Diagnosis not present

## 2012-09-12 DIAGNOSIS — I059 Rheumatic mitral valve disease, unspecified: Secondary | ICD-10-CM | POA: Insufficient documentation

## 2012-09-12 DIAGNOSIS — I5023 Acute on chronic systolic (congestive) heart failure: Secondary | ICD-10-CM | POA: Insufficient documentation

## 2012-09-12 DIAGNOSIS — I509 Heart failure, unspecified: Secondary | ICD-10-CM | POA: Insufficient documentation

## 2012-09-12 DIAGNOSIS — I2789 Other specified pulmonary heart diseases: Secondary | ICD-10-CM | POA: Insufficient documentation

## 2012-09-12 DIAGNOSIS — R0602 Shortness of breath: Secondary | ICD-10-CM | POA: Insufficient documentation

## 2012-09-12 DIAGNOSIS — Z9861 Coronary angioplasty status: Secondary | ICD-10-CM | POA: Insufficient documentation

## 2012-09-12 LAB — POCT I-STAT 3, VENOUS BLOOD GAS (G3P V)
Bicarbonate: 25.6 mEq/L — ABNORMAL HIGH (ref 20.0–24.0)
TCO2: 27 mmol/L (ref 0–100)
pCO2, Ven: 45.1 mmHg (ref 45.0–50.0)
pCO2, Ven: 46.3 mmHg (ref 45.0–50.0)
pH, Ven: 7.35 — ABNORMAL HIGH (ref 7.250–7.300)
pH, Ven: 7.361 — ABNORMAL HIGH (ref 7.250–7.300)

## 2012-09-12 LAB — POCT I-STAT 3, ART BLOOD GAS (G3+)
O2 Saturation: 96 %
TCO2: 27 mmol/L (ref 0–100)

## 2012-09-12 SURGERY — JV LEFT HEART CATHETERIZATION WITH CORONARY ANGIOGRAM

## 2012-09-12 MED ORDER — ONDANSETRON HCL 4 MG/2ML IJ SOLN: 4.0000 mg | Freq: Four times a day (QID) | INTRAMUSCULAR | Status: DC | PRN

## 2012-09-12 MED ORDER — ASPIRIN 81 MG PO CHEW
324.0000 mg | CHEWABLE_TABLET | ORAL | Status: DC
  Administered 2012-09-12: 324 mg via ORAL

## 2012-09-12 MED ORDER — ACETAMINOPHEN 325 MG PO TABS: 650.0000 mg | ORAL_TABLET | ORAL | Status: DC | PRN

## 2012-09-12 MED ORDER — DIAZEPAM 5 MG PO TABS
5.0000 mg | ORAL_TABLET | ORAL | Status: DC
  Administered 2012-09-12: 5 mg via ORAL

## 2012-09-12 MED ORDER — SODIUM CHLORIDE 0.9 % IV SOLN: 1.0000 mL/kg/h | INTRAVENOUS | Status: DC

## 2012-09-12 MED ORDER — SODIUM CHLORIDE 0.9 % IV SOLN
1.0000 mL/kg/h | INTRAVENOUS | Status: DC
  Administered 2012-09-12: 1 mL/kg/h via INTRAVENOUS

## 2012-09-12 NOTE — Progress Notes (Signed)
Bedrest begins @ 1240, tegaderm dressing applied by Moishe Spice, site level 0.

## 2012-09-12 NOTE — H&P (View-Only) (Signed)
Patient Care Team: Jeffrey A Todd, MD as PCP - General Patrick E Wright, MD (Pulmonary Disease)   HPI  Caleb [name] is a 72 y.o. male Seen in followup for paroxysmal fibrillation i in the context of schemic heart disease with prior MI and LAD stenting  He also has a history of a prior stroke and is on anticoagulation    Because of problems with NOACs and rashes he is back on warfarinthe most recent GFR is normal.  He was seen in the emergency room Feb 2014 because of atrial fibrillation with a rapid ventricular response. Diltiazem was initiated.  Ranexa was started by Dr. Henderson at [facility] in hopes of augmenting the antiarrhythmic benefit of dofetilide  e; this appears to have been effective at least in part.  He saw Dr. Henderson recently with complaints of worsening shortness of breath. Echocardiogram demonstrated worsening of his MR from mild>>-moderate-severe; TR was also noted. Left atrial dimensions with a 2.8 range of 2.4 range an ejection fraction was in the 25-30% range which was relatively stable. With his known history of coronary disease and the associated pressure taking with his dyspnea catheterization was recommended and is here today to discuss it.  Hospital records reviewed    .    Past Medical History  Diagnosis Date  . CAD (coronary artery disease)   . Atrial fibrillation   . Cardiomyopathy, ischemic   . Heart failure, systolic, acute on chronic   . Hypertension   . Hyperlipidemia   . Pleural effusion   . Positional vertigo   . Dizziness   . GERD (gastroesophageal reflux disease)   . Atypical pneumonia   . Cough   . Allergic rhinitis   . TIA (transient ischemic attack)   . OSA (obstructive sleep apnea)     Home sleep test 07/05/2009 AHI 8.2  . Chronic anticoagulation     Past Surgical History  Procedure Laterality Date  . Coronary stent placement  2004    LAD  . Shoulder surgery      Current Outpatient Prescriptions  Medication Sig  Dispense Refill  . COUMADIN 5 MG tablet Take as directed by coumadn clinic  40 tablet  1  . dofetilide (TIKOSYN) 125 MCG capsule Take 125 mcg by mouth 2 (two) times daily.      . furosemide (LASIX) 20 MG tablet Take 20 mg by mouth as needed.      . metoprolol succinate (TOPROL-XL) 25 MG 24 hr tablet Take 12.5 mg by mouth daily.      . nitroGLYCERIN (NITROSTAT) 0.4 MG SL tablet Place 0.4 mg under the tongue every 5 (five) minutes as needed. May repeat for up to 3 doses.       . Omega-3 Fatty Acids (THE VERY FINEST FISH OIL) LIQD Take 2-3 g by mouth daily.       . Potassium Chloride ER 20 MEQ TBCR Take 20 mEq by mouth as needed.      . RANEXA 500 MG 12 hr tablet Take 500 mg by mouth 2 (two) times daily.       . valsartan (DIOVAN) 40 MG tablet Take 1 tablet (40 mg total) by mouth 2 (two) times daily.  180 tablet  3  . [DISCONTINUED] eplerenone (INSPRA) 25 MG tablet Take 1 tablet (25 mg total) by mouth daily.  30 tablet  11   No current facility-administered medications for this visit.    Allergies  Allergen Reactions  . Sulfonamide Derivatives       REACTION: rash  . Pradaxa (Dabigatran Etexilate Mesylate) Rash  . Xarelto (Rivaroxaban) Rash    Review of Systems negative except from HPI and PMH  Physical Exam BP 124/56  Pulse 57  Ht 5' 10" (1.778 m)  Wt 160 lb (72.576 kg)  BMI 22.96 kg/m2 Well developed and well nourished in no acute distress HENT normal E scleral and icterus clear Neck Supple JVP 6 or so without V waves carotids brisk and full Clear to ausculation  Regular rate and rhythm, 2/6 murmur at the apex Soft with active bowel sounds No clubbing cyanosis none Edema Alert and oriented, grossly normal motor and sensory function Skin Warm and Dry  ECG done last week demonstrated sinus rhythm at 61 with intervals 24/13/47 and occasional PAC nonspecific ST segment flattening  Assessment and  Plan  

## 2012-09-12 NOTE — Interval H&P Note (Signed)
History and Physical Interval Note:  09/12/2012 11:44 AM  Caleb [name]  has presented today for surgery, with the diagnosis of cp  The various methods of treatment have been discussed with the patient and family. After consideration of risks, benefits and other options for treatment, the patient has consented to  Procedure(s): JV LEFT HEART CATHETERIZATION WITH CORONARY ANGIOGRAM (N/A) as a surgical intervention .  The patient's history has been reviewed, patient examined, no change in status, stable for surgery.  I have reviewed the patient's chart and labs.  Questions were answered to the patient's satisfaction.     Sherren Mocha

## 2012-09-12 NOTE — CV Procedure (Signed)
Cardiac Catheterization Procedure Note  Name: Caleb [name] MRN: [id number] DOB: 27-Jun-1940  Procedure: Right Heart Cath, Left Heart Cath, Selective Coronary Angiography, LV angiography  Indication: Congestive heart failure, mitral regurgitation. This is a 72 year old gentleman with cardiomyopathy and progressive shortness of breath. He's been noted to have moderately severe mitral regurgitation on her recent echo. He has known coronary artery disease with previous LAD stenting. He's had progressive dyspnea and exercise intolerance, New York Heart Association class III symptoms.  Procedural Details: The right groin was prepped, draped, and anesthetized with 1% lidocaine. Using the modified Seldinger technique a 4 French sheath was placed in the right femoral artery and a 6 French sheath was placed in the right femoral vein. A multipurpose catheter was used for the right heart catheterization. Standard protocol was followed for recording of right heart pressures and sampling of oxygen saturations. Fick cardiac output was calculated. Standard Judkins catheters were used for selective coronary angiography and left ventriculography. A JL 5 catheter was used for imaging of the left coronary artery. There were no immediate procedural complications. The patient was transferred to the post catheterization recovery area for further monitoring.  Procedural Findings: Hemodynamics RA A wave 3, V wave 1, mean of 0 RV 51/3 PA 46/16 with a mean of 29 PCWP A wave 17, V wave 27, mean of 16 LV 156/28 AO 159/70 with a mean of 106  Oxygen saturations: PA 72 AO 96 SVC 71  Cardiac Output (Fick) 4.7  Cardiac Index (Fick) 2.5   Coronary angiography: Coronary dominance: right  Left mainstem: Widely patent with no obstructive disease.  Left anterior descending (LAD): The LAD reaches the left ventricular apex. The proximal LAD is stented and the stented segment is widely patent without disease. The first  diagonal branch is widely patent. Just after the first diagonal branch there is a moderately calcified 75-80% stenosis. The remaining portions of the mid and distal LAD are patent without significant obstruction.  Left circumflex (LCx): The left circumflex is patent. There is an intermediate branch and no significant stenosis. The mid circumflex in the AV groove has a 75-80% stenosis. This vessel supplies 2 small posterolateral branches.  Right coronary artery (RCA): Large, dominant vessel. There is a 30-40% proximal vessel stenosis. There are diffuse irregularities present. The PDA is large without significant stenosis. The PLA branches are small with no significant stenoses.  Left ventriculography: There is severe LV dysfunction. The estimated left ventricular ejection fraction is 25-30%. There is akinesis of the mid anterior wall through the periapical region. The inferior wall appears to contract normally. The most basal segment of the anterior wall appears to contract normally. There is at least 3+ mitral regurgitation.  Final Conclusions:   1. Severe mid LAD stenosis with continued patency of the proximal LAD stent 2. Severe mid circumflex stenosis extending into 2 small posterolateral branches 3. Minor nonobstructive stenosis of a large, dominant RCA 4. Severe segmental left ventricular systolic dysfunction with probably severe mitral regurgitation  Recommendations:  The patient has a severe ischemic cardiomyopathy. He has what appears to be severe mitral regurgitation based on left ventriculography, a large V wave in the pulmonary capillary wedge tracing, and pulmonary hypertension. He has developed a severe mid LAD de novo stenosis. He appears to be symptomatic with New York Heart Association class III heart failure. Considerations include cardiac resynchronization, mitral valve repair, and coronary revascularization either percutaneously or with surgical bypass. I think the next steps in his  evaluation should probably  be a transesophageal echo and cardiac surgery evaluation.   Sherren Mocha 09/12/2012, 1:44 PM

## 2012-09-15 ENCOUNTER — Telehealth: Payer: Self-pay | Admitting: Internal Medicine

## 2012-09-15 ENCOUNTER — Encounter: Payer: Self-pay | Admitting: *Deleted

## 2012-09-15 NOTE — Telephone Encounter (Signed)
Patient is scheduled for TEE with [name] on 7/2 at 10:00 am.  I have notified him of the date & time and given him verbal instructions for his procedure. Letter of instructions also mailed to the patient.

## 2012-09-15 NOTE — Telephone Encounter (Signed)
New Problem   Pt said if you could give him a call back regarding scheduling another procedure.  He said he didn't know if he is suppose to speak with you or Heather.

## 2012-09-16 ENCOUNTER — Ambulatory Visit (INDEPENDENT_AMBULATORY_CARE_PROVIDER_SITE_OTHER): Payer: Medicare Other | Admitting: *Deleted

## 2012-09-16 DIAGNOSIS — Z7901 Long term (current) use of anticoagulants: Secondary | ICD-10-CM | POA: Diagnosis not present

## 2012-09-16 DIAGNOSIS — I4891 Unspecified atrial fibrillation: Secondary | ICD-10-CM

## 2012-09-19 ENCOUNTER — Telehealth: Payer: Self-pay | Admitting: Internal Medicine

## 2012-09-19 NOTE — Telephone Encounter (Signed)
New Prob    Pt has some questions regarding his procedure tomorrow at the hospital. Please call.

## 2012-09-19 NOTE — Telephone Encounter (Signed)
Spoke with pt, questions regarding entrance and parking answered.

## 2012-09-19 NOTE — Telephone Encounter (Signed)
Left message for pt to call

## 2012-09-20 ENCOUNTER — Ambulatory Visit (INDEPENDENT_AMBULATORY_CARE_PROVIDER_SITE_OTHER): Payer: Medicare Other | Admitting: *Deleted

## 2012-09-20 DIAGNOSIS — Z7901 Long term (current) use of anticoagulants: Secondary | ICD-10-CM

## 2012-09-20 DIAGNOSIS — I4891 Unspecified atrial fibrillation: Secondary | ICD-10-CM | POA: Diagnosis not present

## 2012-09-21 ENCOUNTER — Other Ambulatory Visit: Payer: Self-pay | Admitting: Internal Medicine

## 2012-09-21 ENCOUNTER — Encounter (HOSPITAL_COMMUNITY)
Admission: RE | Disposition: A | Discharge status: Home / Self Care | Payer: Self-pay | Source: Ambulatory Visit | Attending: Cardiology

## 2012-09-21 ENCOUNTER — Ambulatory Visit (HOSPITAL_COMMUNITY)
Admission: RE | Admit: 2012-09-21 | Discharge: 2012-09-21 | Disposition: A | Discharge status: Home / Self Care | Payer: Medicare Other | Source: Ambulatory Visit | Attending: Cardiology | Admitting: Cardiology

## 2012-09-21 ENCOUNTER — Encounter (HOSPITAL_COMMUNITY): Payer: Self-pay | Admitting: *Deleted

## 2012-09-21 DIAGNOSIS — I2589 Other forms of chronic ischemic heart disease: Secondary | ICD-10-CM | POA: Diagnosis not present

## 2012-09-21 DIAGNOSIS — I4891 Unspecified atrial fibrillation: Secondary | ICD-10-CM | POA: Diagnosis not present

## 2012-09-21 DIAGNOSIS — I5022 Chronic systolic (congestive) heart failure: Secondary | ICD-10-CM | POA: Diagnosis not present

## 2012-09-21 DIAGNOSIS — Z8673 Personal history of transient ischemic attack (TIA), and cerebral infarction without residual deficits: Secondary | ICD-10-CM | POA: Diagnosis not present

## 2012-09-21 DIAGNOSIS — Z7901 Long term (current) use of anticoagulants: Secondary | ICD-10-CM | POA: Insufficient documentation

## 2012-09-21 DIAGNOSIS — I080 Rheumatic disorders of both mitral and aortic valves: Secondary | ICD-10-CM | POA: Diagnosis not present

## 2012-09-21 DIAGNOSIS — I2510 Atherosclerotic heart disease of native coronary artery without angina pectoris: Secondary | ICD-10-CM | POA: Diagnosis not present

## 2012-09-21 DIAGNOSIS — G4733 Obstructive sleep apnea (adult) (pediatric): Secondary | ICD-10-CM | POA: Insufficient documentation

## 2012-09-21 DIAGNOSIS — I252 Old myocardial infarction: Secondary | ICD-10-CM | POA: Diagnosis not present

## 2012-09-21 DIAGNOSIS — Z9861 Coronary angioplasty status: Secondary | ICD-10-CM | POA: Diagnosis not present

## 2012-09-21 DIAGNOSIS — Z882 Allergy status to sulfonamides status: Secondary | ICD-10-CM | POA: Insufficient documentation

## 2012-09-21 DIAGNOSIS — I059 Rheumatic mitral valve disease, unspecified: Secondary | ICD-10-CM

## 2012-09-21 DIAGNOSIS — E785 Hyperlipidemia, unspecified: Secondary | ICD-10-CM | POA: Insufficient documentation

## 2012-09-21 DIAGNOSIS — Z79899 Other long term (current) drug therapy: Secondary | ICD-10-CM | POA: Diagnosis not present

## 2012-09-21 DIAGNOSIS — I509 Heart failure, unspecified: Secondary | ICD-10-CM | POA: Insufficient documentation

## 2012-09-21 DIAGNOSIS — Z888 Allergy status to other drugs, medicaments and biological substances status: Secondary | ICD-10-CM | POA: Insufficient documentation

## 2012-09-21 DIAGNOSIS — I10 Essential (primary) hypertension: Secondary | ICD-10-CM | POA: Diagnosis not present

## 2012-09-21 DIAGNOSIS — K219 Gastro-esophageal reflux disease without esophagitis: Secondary | ICD-10-CM | POA: Insufficient documentation

## 2012-09-21 HISTORY — PX: TEE WITHOUT CARDIOVERSION: SHX5443

## 2012-09-21 SURGERY — ECHOCARDIOGRAM, TRANSESOPHAGEAL
Anesthesia: Moderate Sedation

## 2012-09-21 MED ORDER — MIDAZOLAM HCL 10 MG/2ML IJ SOLN
INTRAMUSCULAR | Status: DC | PRN
  Administered 2012-09-21 (×2): 2 mg via INTRAVENOUS

## 2012-09-21 MED ORDER — SODIUM CHLORIDE 0.9 % IV SOLN
INTRAVENOUS | Status: DC
  Administered 2012-09-21: 500 mL via INTRAVENOUS

## 2012-09-21 MED ORDER — MIDAZOLAM HCL 5 MG/ML IJ SOLN
INTRAMUSCULAR | Status: AC
  Filled 2012-09-21: qty 2

## 2012-09-21 MED ORDER — BUTAMBEN-TETRACAINE-BENZOCAINE 2-2-14 % EX AERO
INHALATION_SPRAY | CUTANEOUS | Status: DC | PRN
  Administered 2012-09-21: 2 via TOPICAL

## 2012-09-21 MED ORDER — FENTANYL CITRATE 0.05 MG/ML IJ SOLN
INTRAMUSCULAR | Status: AC
  Filled 2012-09-21: qty 2

## 2012-09-21 MED ORDER — FENTANYL CITRATE 0.05 MG/ML IJ SOLN
INTRAMUSCULAR | Status: DC | PRN
  Administered 2012-09-21 (×2): 25 ug via INTRAVENOUS

## 2012-09-21 NOTE — Interval H&P Note (Signed)
History and Physical Interval Note:  09/21/2012 10:06 AM  Caleb [name]  has presented today for surgery, with the diagnosis of Mitroregeritation  The various methods of treatment have been discussed with the patient and family. After consideration of risks, benefits and other options for treatment, the patient has consented to  Procedure(s): TRANSESOPHAGEAL ECHOCARDIOGRAM (TEE) (N/A) as a surgical intervention .  The patient's history has been reviewed, patient examined, no change in status, stable for surgery.  I have reviewed the patient's chart and labs.  Questions were answered to the patient's satisfaction.     Adden Strout [employer]

## 2012-09-21 NOTE — Progress Notes (Signed)
  Echocardiogram Echocardiogram Transesophageal has been performed.  Philipp Deputy 09/21/2012, 12:34 PM

## 2012-09-21 NOTE — H&P (View-Only) (Signed)
Patient Care Team: Dorena Cookey, MD as PCP - General Elsie Stain, MD (Pulmonary Disease)   HPI  Caleb [name] is a 72 y.o. male Seen in followup for paroxysmal fibrillation i in the context of schemic heart disease with prior MI and LAD stenting  He also has a history of a prior stroke and is on anticoagulation    Because of problems with NOACs and rashes he is back on warfarinthe most recent GFR is normal.  He was seen in the emergency room Feb 2014 because of atrial fibrillation with a rapid ventricular response. Diltiazem was initiated.  Ranexa was started by Dr. Koleen Nimrod at [facility] in hopes of augmenting the antiarrhythmic benefit of dofetilide  e; this appears to have been effective at least in part.  He saw Dr. Koleen Nimrod recently with complaints of worsening shortness of breath. Echocardiogram demonstrated worsening of his MR from mild>>-moderate-severe; TR was also noted. Left atrial dimensions with a 2.8 range of 2.4 range an ejection fraction was in the 25-30% range which was relatively stable. With his known history of coronary disease and the associated pressure taking with his dyspnea catheterization was recommended and is here today to discuss it.  Hospital records reviewed    .    Past Medical History  Diagnosis Date  . CAD (coronary artery disease)   . Atrial fibrillation   . Cardiomyopathy, ischemic   . Heart failure, systolic, acute on chronic   . Hypertension   . Hyperlipidemia   . Pleural effusion   . Positional vertigo   . Dizziness   . GERD (gastroesophageal reflux disease)   . Atypical pneumonia   . Cough   . Allergic rhinitis   . TIA (transient ischemic attack)   . OSA (obstructive sleep apnea)     Home sleep test 07/05/2009 AHI 8.2  . Chronic anticoagulation     Past Surgical History  Procedure Laterality Date  . Coronary stent placement  2004    LAD  . Shoulder surgery      Current Outpatient Prescriptions  Medication Sig  Dispense Refill  . COUMADIN 5 MG tablet Take as directed by coumadn clinic  40 tablet  1  . dofetilide (TIKOSYN) 125 MCG capsule Take 125 mcg by mouth 2 (two) times daily.      . furosemide (LASIX) 20 MG tablet Take 20 mg by mouth as needed.      . metoprolol succinate (TOPROL-XL) 25 MG 24 hr tablet Take 12.5 mg by mouth daily.      . nitroGLYCERIN (NITROSTAT) 0.4 MG SL tablet Place 0.4 mg under the tongue every 5 (five) minutes as needed. May repeat for up to 3 doses.       . Omega-3 Fatty Acids (THE VERY FINEST FISH OIL) LIQD Take 2-3 g by mouth daily.       . Potassium Chloride ER 20 MEQ TBCR Take 20 mEq by mouth as needed.      Marland Kitchen RANEXA 500 MG 12 hr tablet Take 500 mg by mouth 2 (two) times daily.       . valsartan (DIOVAN) 40 MG tablet Take 1 tablet (40 mg total) by mouth 2 (two) times daily.  180 tablet  3  . [DISCONTINUED] eplerenone (INSPRA) 25 MG tablet Take 1 tablet (25 mg total) by mouth daily.  30 tablet  11   No current facility-administered medications for this visit.    Allergies  Allergen Reactions  . Sulfonamide Derivatives  REACTION: rash  . Pradaxa (Dabigatran Etexilate Mesylate) Rash  . Xarelto (Rivaroxaban) Rash    Review of Systems negative except from HPI and PMH  Physical Exam BP 124/56  Pulse 57  Ht _0  (1.778 m)  Wt 160 lb (72.576 kg)  BMI 22.96 kg/m2 Well developed and well nourished in no acute distress HENT normal E scleral and icterus clear Neck Supple JVP 6 or so without V waves carotids brisk and full Clear to ausculation  Regular rate and rhythm, 2/6 murmur at the apex Soft with active bowel sounds No clubbing cyanosis none Edema Alert and oriented, grossly normal motor and sensory function Skin Warm and Dry  ECG done last week demonstrated sinus rhythm at 61 with intervals 24/13/47 and occasional PAC nonspecific ST segment flattening  Assessment and  Plan

## 2012-09-21 NOTE — CV Procedure (Signed)
Procedure: TEE  Indication: Mitral regurgitation, ischemic cardiomyopathy  Sedation: Versed 4 mg IV, Fentanyl 50 mcg IV  Findings: The left ventricle was mildly dilated with severe systolic dysfunction, EF 92%.  The anterior, anteroseptal, and apical walls were akinetic.  There was no more than moderate mitral regurgitation.  PISA ERO 0.3 cm^2.  Normal RV size and systolic function. Mild AI.  Mildly dilated aortic root.  No PFO/ASD.    Caleb [name] 09/21/2012 10:41 AM

## 2012-09-22 ENCOUNTER — Encounter (HOSPITAL_COMMUNITY): Payer: Self-pay | Admitting: Cardiology

## 2012-09-26 ENCOUNTER — Ambulatory Visit (INDEPENDENT_AMBULATORY_CARE_PROVIDER_SITE_OTHER): Payer: Medicare Other | Admitting: *Deleted

## 2012-09-26 DIAGNOSIS — Z7901 Long term (current) use of anticoagulants: Secondary | ICD-10-CM

## 2012-09-26 DIAGNOSIS — I4891 Unspecified atrial fibrillation: Secondary | ICD-10-CM | POA: Diagnosis not present

## 2012-09-26 LAB — POCT INR: INR: 2.2

## 2012-09-27 ENCOUNTER — Other Ambulatory Visit: Payer: Self-pay | Admitting: *Deleted

## 2012-09-27 DIAGNOSIS — I255 Ischemic cardiomyopathy: Secondary | ICD-10-CM

## 2012-09-29 ENCOUNTER — Encounter: Payer: Self-pay | Admitting: Internal Medicine

## 2012-09-29 ENCOUNTER — Telehealth: Payer: Self-pay | Admitting: Internal Medicine

## 2012-09-29 NOTE — Telephone Encounter (Signed)
New Problem

## 2012-10-05 ENCOUNTER — Ambulatory Visit (HOSPITAL_COMMUNITY)
Admission: RE | Admit: 2012-10-05 | Discharge: 2012-10-05 | Disposition: A | Discharge status: Home / Self Care | Payer: Medicare Other | Source: Ambulatory Visit | Attending: Internal Medicine | Admitting: Internal Medicine

## 2012-10-05 DIAGNOSIS — I059 Rheumatic mitral valve disease, unspecified: Secondary | ICD-10-CM | POA: Diagnosis not present

## 2012-10-05 DIAGNOSIS — I428 Other cardiomyopathies: Secondary | ICD-10-CM | POA: Diagnosis not present

## 2012-10-05 DIAGNOSIS — I2510 Atherosclerotic heart disease of native coronary artery without angina pectoris: Secondary | ICD-10-CM | POA: Insufficient documentation

## 2012-10-05 DIAGNOSIS — I255 Ischemic cardiomyopathy: Secondary | ICD-10-CM

## 2012-10-05 MED ORDER — GADOBENATE DIMEGLUMINE 529 MG/ML IV SOLN
23.0000 mL | Freq: Once | INTRAVENOUS | Status: AC
  Administered 2012-10-05: 23 mL via INTRAVENOUS

## 2012-10-10 ENCOUNTER — Ambulatory Visit (INDEPENDENT_AMBULATORY_CARE_PROVIDER_SITE_OTHER): Payer: Medicare Other | Admitting: *Deleted

## 2012-10-10 DIAGNOSIS — Z7901 Long term (current) use of anticoagulants: Secondary | ICD-10-CM | POA: Diagnosis not present

## 2012-10-10 DIAGNOSIS — I4891 Unspecified atrial fibrillation: Secondary | ICD-10-CM

## 2012-10-10 LAB — POCT INR: INR: 2

## 2012-10-13 ENCOUNTER — Telehealth: Payer: Self-pay | Admitting: *Deleted

## 2012-10-13 NOTE — Telephone Encounter (Signed)
The patient called today to inform me that he has spoken with [name]. He was trying to clarify the course of action and mentioned appointments with both Dr. Burt Knack and [name]. I will clarify with [name] what the plan will be and work on scheduling appointments for the patient.

## 2012-10-14 NOTE — Telephone Encounter (Signed)
Discussed with dr Caryl Comes, pt aware he has spoke with dr cooper and they felt the pt should follow up with dr cooper. The first available I gave the pt was 11-11-12. He would like to be seen sooner, will forward to lauren, dr cooper's nurse for her to call the pt with an appt.

## 2012-10-14 NOTE — Telephone Encounter (Signed)
Per dr Caryl Comes, the pt will need to be seen when cooper can work him in.

## 2012-10-18 ENCOUNTER — Other Ambulatory Visit: Payer: Self-pay | Admitting: *Deleted

## 2012-10-18 DIAGNOSIS — R42 Dizziness and giddiness: Secondary | ICD-10-CM

## 2012-10-19 ENCOUNTER — Telehealth: Payer: Self-pay | Admitting: Internal Medicine

## 2012-10-19 ENCOUNTER — Other Ambulatory Visit: Payer: Self-pay | Admitting: Family Medicine

## 2012-10-19 DIAGNOSIS — R790 Abnormal level of blood mineral: Secondary | ICD-10-CM

## 2012-10-19 DIAGNOSIS — I10 Essential (primary) hypertension: Secondary | ICD-10-CM

## 2012-10-19 DIAGNOSIS — R972 Elevated prostate specific antigen [PSA]: Secondary | ICD-10-CM

## 2012-10-19 DIAGNOSIS — E785 Hyperlipidemia, unspecified: Secondary | ICD-10-CM

## 2012-10-19 NOTE — Telephone Encounter (Signed)
Follow up  Pt is calling again regarding the appts that need to be schedule.

## 2012-10-19 NOTE — Telephone Encounter (Signed)
Pt Signed ROI, faxed back to me All Cardiac Records were faxed to  [place] at [phone] Copy of Echo being made Once I receive this Mail to : [facility] [place] [address] [place], [place] 10/19/12/KM

## 2012-10-20 ENCOUNTER — Encounter: Payer: Self-pay | Admitting: Family Medicine

## 2012-10-20 ENCOUNTER — Ambulatory Visit (INDEPENDENT_AMBULATORY_CARE_PROVIDER_SITE_OTHER): Payer: Medicare Other | Admitting: Family Medicine

## 2012-10-20 VITALS — BP 128/74 | Temp 97.8°F | Ht 70.0 in | Wt 158.0 lb

## 2012-10-20 DIAGNOSIS — E785 Hyperlipidemia, unspecified: Secondary | ICD-10-CM

## 2012-10-20 DIAGNOSIS — I10 Essential (primary) hypertension: Secondary | ICD-10-CM | POA: Diagnosis not present

## 2012-10-20 DIAGNOSIS — Z23 Encounter for immunization: Secondary | ICD-10-CM

## 2012-10-20 DIAGNOSIS — R972 Elevated prostate specific antigen [PSA]: Secondary | ICD-10-CM

## 2012-10-20 DIAGNOSIS — R790 Abnormal level of blood mineral: Secondary | ICD-10-CM

## 2012-10-20 LAB — LIPID PANEL
Cholesterol: 236 mg/dL — ABNORMAL HIGH (ref 0–200)
HDL: 58.6 mg/dL (ref >39.00)
Total CHOL/HDL Ratio: 4
Triglycerides: 38 mg/dL (ref 0.0–149.0)

## 2012-10-20 LAB — CBC WITH DIFFERENTIAL/PLATELET
Basophils Absolute: 0 10*3/uL (ref 0.0–0.1)
Hemoglobin: 15.5 g/dL (ref 13.0–17.0)
Lymphocytes Relative: 28.4 % (ref 12.0–46.0)
Monocytes Relative: 9.8 % (ref 3.0–12.0)
Platelets: 199 10*3/uL (ref 150.0–400.0)
RDW: 15.1 % — ABNORMAL HIGH (ref 11.5–14.6)

## 2012-10-20 LAB — BASIC METABOLIC PANEL
Chloride: 99 mEq/L (ref 96–112)
Creatinine, Ser: 0.9 mg/dL (ref 0.4–1.5)
Sodium: 136 mEq/L (ref 135–145)

## 2012-10-20 LAB — POCT URINALYSIS DIPSTICK
Leukocytes, UA: NEGATIVE
Protein, UA: NEGATIVE
Urobilinogen, UA: 0.2

## 2012-10-20 LAB — HEPATIC FUNCTION PANEL
ALT: 12 U/L (ref 0–53)
AST: 14 U/L (ref 0–37)
Albumin: 3.9 g/dL (ref 3.5–5.2)

## 2012-10-20 LAB — PSA: PSA: 14.77 ng/mL — ABNORMAL HIGH (ref 0.10–4.00)

## 2012-10-20 LAB — LDL CHOLESTEROL, DIRECT: Direct LDL: 163.9 mg/dL

## 2012-10-20 LAB — TSH: TSH: 2.22 u[IU]/mL (ref 0.35–5.50)

## 2012-10-20 NOTE — Addendum Note (Signed)
Addended by: Elmer Picker on: 10/20/2012 09:48 AM   Modules accepted: Orders

## 2012-10-20 NOTE — Progress Notes (Signed)
Medicare Annual Preventive Care Visit  (initial annual wellness or annual wellness exam)  72 yo M pt of Dr. Honor Junes with complicated PMH including extensive CV disease followed closely by cardiology here for annual medicare wellness exam. He reports his PCP ordered labs for him. He has had a lot of issues with heart recently and stopped exercising and not eating as much.   1.) Patient-completed health risk assessment  - completed and reviewed, see scanned documentation  2.) Review of Medical History: -PMH, PSH, Family History and current specialty and care providers reviewed and updated and listed below  - see scanned info and chart   3.) Review of functional ability and level of safety:  Any difficulty hearing? NO  History of falling? NO  Any trouble with IADLs - using a phone, using transportation, grocery shopping, preparing meals, doing housework, doing laundry, taking medications and managing money? NO  Advance Directives? NO - wife and him are meeting with lawyer to discuss this  See summary of recommendations in Patient Instructions below.  4.) Physical Exam Filed Vitals:   10/20/12 0819  BP: 128/74  Temp: 97.8 F (36.6 C)   Estimated body mass index is 22.67 kg/(m^2) as calculated from the following:   Height as of this encounter: _0  (1.778 m).   Weight as of this encounter: 158 lb (71.668 kg).  Mini Cog: 1. Patient instructed to listen carefully and repeat the following: [place]  2. Clock drawing test was administered: NORMAL       3. Recall of three words 3/3 - all normal  Scoring:  Patient Score: NEG    See patient instructions for recommendations.  4)The following written screening schedule of preventive measures were reviewed with assessment and plan made per below, orders and patient instructions:      AAA screening: N/A     Alcohol screening: done, counseled     Obesity Screening and counseling: N/A     STI screening: declined     Tobacco Screening: done       Pneumococcal (PPSV23 -one dose after 64, one before if risk factors), influenza yearly and hepatitis B vaccines (if high risk - end stage renal disease, IV drugs, homosexual men, live in home for mentally retarded, hemophilia receiving factors) ASSESSMENT/PLAN: completed      Prostate cancer screening ASSESSMENT/PLAN: discussed: his PCP does this      Colorectal cancer screening (FOBT yearly or flex sig q4y or colonoscopy q10y or barium enema q4y) ASSESSMENT/PLAN:  Declined today      Screening for glaucoma ASSESSMENT/PLAN: see eye doctor on regular basis      Cardiovascular screening blood tests (lipids q5y) ASSESSMENT/PLAN: done by PCP      Diabetes screening tests ASSESSMENT/PLAN: done by PCP   7.) Summary: -risk factors and conditions per above assessment were discussed and treatment, recommendations and referrals were offered per documentation above and orders and patient instructions.

## 2012-10-20 NOTE — Telephone Encounter (Signed)
I spoke with the pt and scheduled him to see Dr Burt Knack on 11/15/12.  The pt said he is very anxious after his phone discussion with Dr Caryl Comes and he would appreciate a call from Dr Burt Knack to discuss what exactly his plan of care is going forward. I will route this message to Dr Burt Knack.

## 2012-10-20 NOTE — Patient Instructions (Addendum)
Please see a lawyer and/or go to this website to help you with advanced directives and designating a health care power of attorney so that your wishes will be followed should you become too ill to make your own medical decisions.  [email or web address]       AAA screening: N/A     Alcohol screening: done, counseled     Obesity Screening and counseling: N/A     STI screening: declined     Tobacco Screening: done       Pneumococcal (PPSV23 -one dose after 64, one before if risk factors), influenza yearly and hepatitis B vaccines (if high risk - end stage renal disease, IV drugs, homosexual men, live in home for mentally retarded, hemophilia receiving factors) ASSESSMENT/PLAN: completed      Prostate cancer screening ASSESSMENT/PLAN: discussed: his PCP does this      Colorectal cancer screening (FOBT yearly or flex sig q4y or colonoscopy q10y or barium enema q4y) ASSESSMENT/PLAN:  Declined today      Screening for glaucoma ASSESSMENT/PLAN: see eye doctor on regular basis      Cardiovascular screening blood tests (lipids q5y) ASSESSMENT/PLAN: done by PCP      Diabetes screening tests ASSESSMENT/PLAN: done by PCP  -Dr. Sherren Mocha ordered labs or studies for you. It can take up to 1-2 weeks for results and processing. We will contact you with instructions IF your results are abnormal. Normal results will be released to your [facility]. If you have not heard from [place] or can not find your results in [facility] in 2 weeks please contact our office.  Please follow up with Dr. Sherren Mocha in one month for weight check, in the meantime please try ensure clear twice daily after meals.

## 2012-10-20 NOTE — Addendum Note (Signed)
Addended by: Colleen Can on: 10/20/2012 08:59 AM   Modules accepted: Orders

## 2012-10-24 ENCOUNTER — Telehealth: Payer: Self-pay | Admitting: Internal Medicine

## 2012-10-24 NOTE — Telephone Encounter (Signed)
CD of Echo Mailed To  [facility] [place] 10/24/12/KM

## 2012-10-25 LAB — MAGNESIUM, RBC: Magnesium RBC: 4 mg/dL (ref 4.0–6.4)

## 2012-10-31 ENCOUNTER — Telehealth: Payer: Self-pay | Admitting: Internal Medicine

## 2012-10-31 ENCOUNTER — Ambulatory Visit (INDEPENDENT_AMBULATORY_CARE_PROVIDER_SITE_OTHER): Payer: Medicare Other | Admitting: *Deleted

## 2012-10-31 DIAGNOSIS — Z7901 Long term (current) use of anticoagulants: Secondary | ICD-10-CM | POA: Diagnosis not present

## 2012-10-31 DIAGNOSIS — I4891 Unspecified atrial fibrillation: Secondary | ICD-10-CM

## 2012-10-31 LAB — POCT INR: INR: 2.1

## 2012-10-31 NOTE — Telephone Encounter (Signed)
Received a call from the patient on 8.7.14. The patient stated that the medical center in [place] received the requested records; however, the medical center in [place] had not received the requested records. Echo discs from 7.2.14 and 3.19.14 and cath from 6.23.14 along with reports were prepared and sent to [facility] interoffice for mailing to [facility] for the patients upcoming appointment/djc

## 2012-10-31 NOTE — Telephone Encounter (Signed)
Disc of Cardiac Catheterization requested and picked up from [facility] Cath lab, 2 discs of Echocardiograms received from [facility]. All documents and 3 DVD's sent via [employer] to [place], [place], [place]: [address], [place] [address] 10/31/12 rmf

## 2012-11-02 ENCOUNTER — Encounter: Payer: Self-pay | Admitting: Cardiovascular Disease

## 2012-11-02 ENCOUNTER — Ambulatory Visit (INDEPENDENT_AMBULATORY_CARE_PROVIDER_SITE_OTHER): Payer: Medicare Other | Admitting: Cardiovascular Disease

## 2012-11-02 VITALS — BP 150/82 | HR 71 | Ht 70.0 in | Wt 159.0 lb

## 2012-11-02 DIAGNOSIS — I5022 Chronic systolic (congestive) heart failure: Secondary | ICD-10-CM | POA: Diagnosis not present

## 2012-11-02 DIAGNOSIS — I2510 Atherosclerotic heart disease of native coronary artery without angina pectoris: Secondary | ICD-10-CM | POA: Diagnosis not present

## 2012-11-02 DIAGNOSIS — I059 Rheumatic mitral valve disease, unspecified: Secondary | ICD-10-CM | POA: Diagnosis not present

## 2012-11-02 DIAGNOSIS — I340 Nonrheumatic mitral (valve) insufficiency: Secondary | ICD-10-CM

## 2012-11-02 NOTE — Patient Instructions (Signed)
Pt will call back after further evaluation in [place].

## 2012-11-04 ENCOUNTER — Telehealth: Payer: Self-pay | Admitting: Cardiovascular Disease

## 2012-11-04 ENCOUNTER — Encounter: Payer: Self-pay | Admitting: Cardiovascular Disease

## 2012-11-04 DIAGNOSIS — I2510 Atherosclerotic heart disease of native coronary artery without angina pectoris: Secondary | ICD-10-CM

## 2012-11-04 DIAGNOSIS — I340 Nonrheumatic mitral (valve) insufficiency: Secondary | ICD-10-CM

## 2012-11-04 DIAGNOSIS — Z139 Encounter for screening, unspecified: Secondary | ICD-10-CM

## 2012-11-04 DIAGNOSIS — I480 Paroxysmal atrial fibrillation: Secondary | ICD-10-CM

## 2012-11-04 DIAGNOSIS — I5022 Chronic systolic (congestive) heart failure: Secondary | ICD-10-CM

## 2012-11-04 NOTE — Progress Notes (Addendum)
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 I have not formally seen him in the office, but admit him when he underwent cardiac catheterization in June.  His cardiac history dates back to 2004 when he had an anterior wall MI. History with stenting of the LAD and his left ventricular ejection fraction initially was estimated at 40%. He underwent repeat heart catheterization in 2005 and this demonstrated wide patency of his stent site with a moderate stenosis in the mid LAD. This was characterized as a 60% stenosis angiographically, intravascular ultrasound was performed and this gave a percent area stenosis of 54%. Medical therapy was recommended. The minimal lumen area in the mid LAD was 3.2 mm.  The patient developed atrial fibrillation within the next few years and has been on chronic anticoagulation with warfarin. He has undergone cardioversions and has been maintained on Tikosyn and Ranexa. His LVEF dating back to 2009 when he was in atrial fib was in the range of 35-40%. He does have a history of TIA. He's been treated primarily with warfarin, but was on a novel anticoagulant for a short time. He has a history of multiple medication intolerances.  Earlier this year, the patient began to experience more fatigue. He went to [facility] for a stress echocardiogram, but this was not completed because of a decline in his left ventricular ejection fraction now in the range of 20-25%. At that time the patient was referred for right and left heart catheterization which was performed June 23 with pertinent hemodynamic findings of mild pulmonary hypertension, a pulmonary capillary wedge pressure V wave of 27 mmHg, and preserved cardiac output of 4.7 L per minute with a cardiac index of 2.5. Coronary angiography demonstrated 75% stenosis of the mid LAD after the diagonal branch. The proximal LAD stented segment demonstrated  continued patency. There was moderately severe stenosis in the mid left circumflex but this vessel only supplying 2 small posterolateral branches. The right coronary artery was a large dominant vessel without significant disease. Left ventriculography demonstrated severe LV dysfunction with a left ventricular ejection fraction of 25-30%. Mitral regurgitation was graded at 3+. A TEE was recommended for further characterization of the patient's mitral regurgitation. This was performed July 2 and demonstrated a left ventricular ejection fraction of 25% with akinesis of the anterior, anteroseptal, and apical walls. There was central mitral regurgitation quantified as moderate with an ERO of 0.3 cm. There is mild aortic insufficiency and mild aortic root dilatation. Finally, the patient went for a cardiac MRI to assess for potential viability of the anterior wall in the setting of his mid LAD stenosis. Delayed enhancement demonstrated nearly full-thickness scar of the mid to apical anterior wall, the apical inferior wall, and the true apex. The left ventricular ejection fraction was calculated to be 35%.  From a symptomatic perspective, Mr. Gancarz complains of episodic chest pains at rest. These are fleeting. He does not have chest discomfort associated with walking. He has occasional dizziness without history of syncope. He's had no recent palpitations. He does admit to fatigue. His symptoms appear to be New York Heart Association class II. He denies edema, orthopnea, or PND.   Outpatient Encounter Prescriptions as of 11/02/2012  Medication Sig Dispense Refill  . COUMADIN 5 MG tablet TAKE AS DIRECTED.  40 tablet  3  . dofetilide (TIKOSYN) 125 MCG capsule Take 125 mcg by mouth 2 (two) times daily.      Marland Kitchen  enoxaparin (LOVENOX) 100 MG/ML injection Inject 1 mL (100 mg total) into the skin daily.  10 Syringe  1  . furosemide (LASIX) 20 MG tablet Take 20 mg by mouth as needed.      . metoprolol succinate (TOPROL-XL)  25 MG 24 hr tablet Take 1 tablet (25 mg total) by mouth 2 (two) times daily.  180 tablet  1  . nitroGLYCERIN (NITROSTAT) 0.4 MG SL tablet Place 0.4 mg under the tongue every 5 (five) minutes as needed. May repeat for up to 3 doses.       . Omega-3 Fatty Acids (THE VERY FINEST FISH OIL) LIQD Take 2-3 g by mouth daily.       . Potassium Chloride ER 20 MEQ TBCR Take 20 mEq by mouth as needed.      Marland Kitchen RANEXA 500 MG 12 hr tablet Take 500 mg by mouth 2 (two) times daily.       . valsartan (DIOVAN) 40 MG tablet Take 1 tablet (40 mg total) by mouth 2 (two) times daily.  180 tablet  3   No facility-administered encounter medications on file as of 11/02/2012.    Allergies  Allergen Reactions  . Sulfonamide Derivatives     REACTION: rash  . Pradaxa [Dabigatran Etexilate Mesylate] Rash  . Xarelto [Rivaroxaban] Rash    Past Medical History  Diagnosis Date  . CAD (coronary artery disease)   . Atrial fibrillation   . Cardiomyopathy, ischemic   . Heart failure, systolic, acute on chronic   . Hypertension   . Hyperlipidemia   . Pleural effusion   . Positional vertigo   . Dizziness   . GERD (gastroesophageal reflux disease)   . Atypical pneumonia   . Cough   . Allergic rhinitis   . TIA (transient ischemic attack)   . OSA (obstructive sleep apnea)     Home sleep test 07/05/2009 AHI 8.2  . Chronic anticoagulation    ROS: Negative except as per HPI  BP 150/82  Pulse 71  Ht _0  (1.778 m)  Wt 72.122 kg (159 lb)  BMI 22.81 kg/m2  SpO2 98%  PHYSICAL EXAM: Pt is alert and oriented, NAD HEENT: normal Neck: JVP - normal, carotids 2+= without bruits Lungs: CTA bilaterally CV: RRR with grade 2/6 holosystolic murmur at the apex with soft diastolic decrescendo murmur Abd: soft, NT, Positive BS, no hepatomegaly Ext: no C/C/E, distal pulses intact and equal Skin: warm/dry no rash Neuro: CNII-XII intact, strength 5/5 = bilaterally  Cardiac Catheterization 09/12/2012: Procedural Findings:    Hemodynamics  RA A wave 3, V wave 1, mean of 0  RV 51/3  PA 46/16 with a mean of 29  PCWP A wave 17, V wave 27, mean of 16  LV 156/28  AO 159/70 with a mean of 106  Oxygen saturations:  PA 72  AO 96  SVC 71  Cardiac Output (Fick) 4.7  Cardiac Index (Fick) 2.5  Coronary angiography:  Coronary dominance: right  Left mainstem: Widely patent with no obstructive disease.  Left anterior descending (LAD): The LAD reaches the left ventricular apex. The proximal LAD is stented and the stented segment is widely patent without disease. The first diagonal branch is widely patent. Just after the first diagonal branch there is a moderately calcified 75-80% stenosis. The remaining portions of the mid and distal LAD are patent without significant obstruction.  Left circumflex (LCx): The left circumflex is patent. There is an intermediate branch and no significant stenosis. The mid circumflex  in the AV groove has a 75-80% stenosis. This vessel supplies 2 small posterolateral branches.  Right coronary artery (RCA): Large, dominant vessel. There is a 30-40% proximal vessel stenosis. There are diffuse irregularities present. The PDA is large without significant stenosis. The PLA branches are small with no significant stenoses.  Left ventriculography: There is severe LV dysfunction. The estimated left ventricular ejection fraction is 25-30%. There is akinesis of the mid anterior wall through the periapical region. The inferior wall appears to contract normally. The most basal segment of the anterior wall appears to contract normally. There is at least 3+ mitral regurgitation.   Final Conclusions:  1. Severe mid LAD stenosis with continued patency of the proximal LAD stent  2. Severe mid circumflex stenosis extending into 2 small posterolateral branches  3. Minor nonobstructive stenosis of a large, dominant RCA  4. Severe segmental left ventricular systolic dysfunction with probably severe mitral  regurgitation   TEE 09/21/12: Study Conclusions  - Left ventricle: The cavity size was mildly dilated. Wall thickness was normal. The estimated ejection fraction was 25%. The anterior, anteroseptal, and apical walls were akinetic. - Aortic valve: There was no stenosis. Mild regurgitation. - Aorta: Mildly dilated aortic root at sinuses of valsalva measuring 4.3 cm. Ascending aorta dilated to 4.2 cm. Grade III plaque descending thoracic aorta. - Mitral valve: Moderate regurgitation. This appears to be central MR and may be due to annular dilatation. Effective regurgitant orifice: 0.3cm^2 (PISA). - Left atrium: The atrium was mildly dilated. - Right ventricle: The cavity size was normal. Systolic function was normal. - Right atrium: No evidence of thrombus in the atrial cavity or appendage. - Atrial septum: No defect or patent foramen ovale was identified. Echo contrast study showed no right-to-left atrial level shunt, at baseline or with provocation. - Tricuspid valve: Peak RV-RA gradient 27 mmHg. - Pulmonic valve: No evidence of vegetation. Transesophageal echocardiography. 2D and color Doppler. Patient status: Inpatient. Location: Endoscopy.  Cardiac MRI: IMPRESSION:  1. Mildly dilated LV with moderate systolic function, EF 32%. Wall  motion abnormalities as described above.  2. Normal RV size and systolic function.  3. Probably moderate central mitral regurgitation.  4. Mildly dilated aortic root and ascending aorta.  4. Delayed enhancement pattern in the anterior and anteroseptal  walls suggests that these walls are unlikely to improve  significantly with revascularization.  ASSESSMENT AND PLAN: 1. Chronic systolic heart failure, New York Heart Association class II with probable mixed cardiomyopathy. Suspect the primary cause of his cardiomyopathy is related to his old anterior wall infarction with LV remodeling. He also has paroxysmal atrial fibrillation which could  potentially contribute although am not clear if rapid ventricular rates have been associated with this. I have reviewed multiple echocardiograms and his left ventricular ejection fraction assessment has been all over the board. It does appear that there has been some decline an LVEF over the past year. However, it is somewhat reassuring that by cardiac MRI his left ventricular ejection fraction is calculated at 35%. From perspective of medical therapy, we discussed consideration of adding spironolactone, but he was not inclined to do this at the present time. He has a history of multiple medication intolerances and is going to seek a second opinion at [facility] next month. He would like to wait until that assessment before changing any medicines. He has been intolerant to carvedilol and is treated with metoprolol succinate. He is on an ARB with valsartan 40 mg twice daily.  2. Coronary artery  disease, native vessel. The patient is now 10 years out from an anterior infarction with MRI evidence of nonviable ileum was anterolateral walls. His mid LAD lesion would certainly be technically feasible for percutaneous intervention. However, this territory has greater than 75% scar by delayed enhancement on MRI cannot not confident that PCI would impact his clinical symptoms or left ventricular ejection fraction. His left circumflex disease involves a very small territory of myocardium as well as small caliber coronary arteries. I have recommended medical therapy. The patient is not on antiplatelet therapy because of chronic Coumadin. Overall he's had little progression of CAD since his last heart catheterization in 2005.  3. Valvular heart disease with moderate mitral regurgitation and mild aortic insufficiency. I was suspicious of a greater degree of mitral regurgitation based on his cardiac cath findings with a large V waves and what appeared to be at least 3+ MR by angiography. However, I have  personally reviewed his TEE images and agree that his mitral regurgitation is moderate. The etiology is likely related to annular dilatation and malcoaptation.   4. Paroxysmal atrial fibrillation. The patient is on a combination of Ranexa and Tikosyn. He is followed by [name] and is considering options for radiofrequency ablation.  In summary, I think medical therapy is appropriate for the patient's cardiomyopathy, coronary disease, and valvular heart disease. Would consider addition of spironolactone and upward titration of his blockade and ARB as tolerated. I have shown the patient and his wife both his TEE and cardiac cath images. I have personally reviewed multiple imaging studies as detailed above. A total of 90 minutes was spent with this encounter, greater than 50% of that was in face-to-face discussion with the patient.  Sherren Mocha 11/04/2012 6:08 AM

## 2012-11-04 NOTE — Telephone Encounter (Signed)
New Prob  Pt states he talked to Dr Burt Knack about a Psychologist, sport and exercise at [facility] and he is wanting to speak with him again regarding it. He said if he can not reach him at home then try his cell at [phone].

## 2012-11-07 ENCOUNTER — Other Ambulatory Visit: Payer: Self-pay | Admitting: Family Medicine

## 2012-11-07 DIAGNOSIS — R972 Elevated prostate specific antigen [PSA]: Secondary | ICD-10-CM

## 2012-11-08 ENCOUNTER — Other Ambulatory Visit (INDEPENDENT_AMBULATORY_CARE_PROVIDER_SITE_OTHER): Payer: Medicare Other

## 2012-11-08 DIAGNOSIS — R972 Elevated prostate specific antigen [PSA]: Secondary | ICD-10-CM

## 2012-11-08 LAB — PSA: PSA: 13.2 ng/mL — ABNORMAL HIGH (ref 0.10–4.00)

## 2012-11-10 ENCOUNTER — Telehealth: Payer: Self-pay | Admitting: Cardiovascular Disease

## 2012-11-10 NOTE — Telephone Encounter (Signed)
Dr.Leavell call in this am asking For Copies Of Cd  Ct-Done 10/05/12 TEE-Done 09/21/12 Cath Done 09/12/12 Will call [place] Records and See what can be Done about Pt picking these Up there. 11/10/12/km

## 2012-11-10 NOTE — Telephone Encounter (Signed)
Called Radiology Dept Of [facility] to Request Copy Of Pts MRI,Also had to Leave A Message For  Cath Lab requesting Pt Cath & Echo to Be Put On Cd and Sent to Radiology Dept so He can Pick all up  In One Place. Pt Aware and also left [facility] Records # with Pt to Call and Follow Up and to Sign ROI  11/10/12/KM

## 2012-11-15 ENCOUNTER — Ambulatory Visit: Payer: Medicare Other | Admitting: Cardiovascular Disease

## 2012-11-23 NOTE — Telephone Encounter (Signed)
I spoke with the pt and his heart surgery has been scheduled on September 20th with screening scheduled on the 17th at [facility]. The pt needs to have a carotid and PFT (pt calling Dr Joya Gaskins) performed locally. I will place order for carotid and have [place] contact the pt. The pt would like to speak directly with Dr Burt Knack about his evaluation at [facility] and upcoming heart surgery.  I will forward this message to Dr Burt Knack.

## 2012-11-23 NOTE — Telephone Encounter (Signed)
New Problem  Pt sch for heart surgery// moved the date up// is asking to speak with a nurse or the Dr about plan and program// Pt believes the Dr. Hazel Sams a head up on this update.

## 2012-11-24 ENCOUNTER — Telehealth: Payer: Self-pay | Admitting: Critical Care Medicine

## 2012-11-24 DIAGNOSIS — J90 Pleural effusion, not elsewhere classified: Secondary | ICD-10-CM

## 2012-11-24 NOTE — Telephone Encounter (Signed)
I spoke with pt. He stated he is scheduled for heart surgery on 01/09/13 in [place] by Dr. Gilmer Mor. One of the test requirements is if he has a PFT. He is wanting to see Dr. Joya Gaskins also so he scheduled appt for tomorrow 3:00. Please advise regarding PFT Dr. Joya Gaskins thanks

## 2012-11-24 NOTE — Telephone Encounter (Signed)
I spoke with pt. We had an opening at 9 am here in the office. Per pt he is not able to come here at 9 am bc he has another appt and he needs to keep. i advised will call PFT lab ASAP in the AM. Pt will be available after 10:30 AM. I advised pt will call back once we get his appt.

## 2012-11-24 NOTE — Telephone Encounter (Signed)
Patient returning call.

## 2012-11-24 NOTE — Telephone Encounter (Signed)
I received a fax from [facility] in regards to the pt needing Cardiac Clearance from Dr Burt Knack for MVR, CABG, Maze Procedure and Ascending aortic replacement. I will place this request in Dr Antionette Char folder.

## 2012-11-24 NOTE — Telephone Encounter (Signed)
lmomtcb x1 for pt 

## 2012-11-24 NOTE — Telephone Encounter (Signed)
ATC line busy x 3 wcb

## 2012-11-24 NOTE — Telephone Encounter (Signed)
Order full PFTs ASAP  Do at hospital [facility] (if no openings in our office)  if possible BEFORE Appt tomorrow

## 2012-11-25 ENCOUNTER — Encounter: Payer: Self-pay | Admitting: Critical Care Medicine

## 2012-11-25 ENCOUNTER — Ambulatory Visit (HOSPITAL_COMMUNITY)
Admission: RE | Admit: 2012-11-25 | Discharge: 2012-11-25 | Disposition: A | Discharge status: Home / Self Care | Payer: Medicare Other | Source: Ambulatory Visit | Attending: Critical Care Medicine | Admitting: Critical Care Medicine

## 2012-11-25 ENCOUNTER — Ambulatory Visit (INDEPENDENT_AMBULATORY_CARE_PROVIDER_SITE_OTHER): Payer: Medicare Other | Admitting: Critical Care Medicine

## 2012-11-25 VITALS — BP 160/92 | HR 80 | Temp 97.8°F | Ht 69.0 in | Wt 159.6 lb

## 2012-11-25 DIAGNOSIS — Z23 Encounter for immunization: Secondary | ICD-10-CM

## 2012-11-25 DIAGNOSIS — R0609 Other forms of dyspnea: Secondary | ICD-10-CM | POA: Insufficient documentation

## 2012-11-25 DIAGNOSIS — I5022 Chronic systolic (congestive) heart failure: Secondary | ICD-10-CM | POA: Diagnosis not present

## 2012-11-25 DIAGNOSIS — R0989 Other specified symptoms and signs involving the circulatory and respiratory systems: Secondary | ICD-10-CM | POA: Insufficient documentation

## 2012-11-25 DIAGNOSIS — G4733 Obstructive sleep apnea (adult) (pediatric): Secondary | ICD-10-CM | POA: Diagnosis not present

## 2012-11-25 DIAGNOSIS — J90 Pleural effusion, not elsewhere classified: Secondary | ICD-10-CM

## 2012-11-25 NOTE — Assessment & Plan Note (Addendum)
Moderate sleep apnea The pt declined an oral appliance in the past but will now get one made and use Note PFTs essentially normal.  No primary lung disease and no contraindication for surgery pulm wise Plan Obtain oral appliance Records to CV surgeon in [facility] for planned complex cardiac surgery The pt is cleared for CV surgery from pulm perspective

## 2012-11-25 NOTE — Assessment & Plan Note (Addendum)
Dyspnea on exertion Note Normal PFTs:  FeV1 104% FVC  100% TLC 101 %  DLCO 100%

## 2012-11-25 NOTE — Progress Notes (Signed)
Subjective:    Patient ID: Caleb [name], male    DOB: Jun 09, 1940, 72 y.o.   MRN: [id number]  HPI  72 y.o.WM 11/25/2012 Chief Complaint  Patient presents with  . Follow-up    No problems sleeping right now. Does have DOE but overall breathing is doing well.  Has surgery scheduled for Oct 20 in [place].    Planning CV surg in [place] in October:  Mitral valve and aortic valve, CAB , afib ablation, radiofrequency  Atrial appendage clipping, pt with mod  AR and MR Pt with mid LAD stenosis and likely CAB to LAD No new lung issues. He did not get dental appliance made. The pt has moderate sleep apnea  LV has apical thrombus Past Medical History  Diagnosis Date  . CAD (coronary artery disease)   . Atrial fibrillation   . Cardiomyopathy, ischemic   . Heart failure, systolic, acute on chronic   . Hypertension   . Hyperlipidemia   . Pleural effusion   . Positional vertigo   . Dizziness   . GERD (gastroesophageal reflux disease)   . Atypical pneumonia   . Cough   . Allergic rhinitis   . TIA (transient ischemic attack)   . OSA (obstructive sleep apnea)     Home sleep test 07/05/2009 AHI 8.2  . Chronic anticoagulation      Family History  Problem Relation Age of Onset  . Adopted: Yes     History   Social History  . Marital Status: Married    Spouse Name: N/A    Number of Children: N/A  . Years of Education: N/A   Occupational History  . PHYSICIAN     Psychologist   Social History Main Topics  . Smoking status: Never Smoker   . Smokeless tobacco: Never Used  . Alcohol Use: Yes     Comment: Socially  . Drug Use: No  . Sexual Activity: Not on file   Other Topics Concern  . Not on file   Social History Narrative   Married   Gets regular exercise     Allergies  Allergen Reactions  . Sulfonamide Derivatives     REACTION: rash  . Pradaxa [Dabigatran Etexilate Mesylate] Rash  . Xarelto [Rivaroxaban] Rash     Outpatient Prescriptions Prior to Visit   Medication Sig Dispense Refill  . COUMADIN 5 MG tablet TAKE AS DIRECTED.  40 tablet  3  . dofetilide (TIKOSYN) 125 MCG capsule Take 125 mcg by mouth 2 (two) times daily.      . metoprolol succinate (TOPROL-XL) 25 MG 24 hr tablet Take 1 tablet (25 mg total) by mouth 2 (two) times daily.  180 tablet  1  . nitroGLYCERIN (NITROSTAT) 0.4 MG SL tablet Place 0.4 mg under the tongue every 5 (five) minutes as needed. May repeat for up to 3 doses.       . Omega-3 Fatty Acids (THE VERY FINEST FISH OIL) LIQD Take 2-3 g by mouth daily.       Marland Kitchen RANEXA 500 MG 12 hr tablet Take 500 mg by mouth 2 (two) times daily.       . valsartan (DIOVAN) 40 MG tablet Take 1 tablet (40 mg total) by mouth 2 (two) times daily.  180 tablet  3  . furosemide (LASIX) 20 MG tablet Take 20 mg by mouth as needed.      . Potassium Chloride ER 20 MEQ TBCR Take 20 mEq by mouth as needed.      Marland Kitchen  enoxaparin (LOVENOX) 100 MG/ML injection Inject 1 mL (100 mg total) into the skin daily.  10 Syringe  1   No facility-administered medications prior to visit.         Review of Systems  Constitutional:   No  weight loss, night sweats,  Fevers, chills, fatigue, lassitude. HEENT:   No headaches,  Difficulty swallowing,  Tooth/dental problems,  Sore throat,                No sneezing, itching, ear ache, nasal congestion, post nasal drip,   CV:  No chest pain,  Orthopnea, PND, swelling in lower extremities, anasarca, dizziness, palpitations  GI  No heartburn, indigestion, abdominal pain, nausea, vomiting, diarrhea, change in bowel habits, loss of appetite  Resp: ++ shortness of breath with exertion not at rest.  No excess mucus, no productive cough,  No non-productive cough,  No coughing up of blood.  No change in color of mucus.  No wheezing.  No chest wall deformity  Skin: no rash or lesions.  GU: no dysuria, change in color of urine, no urgency or frequency.  No flank pain.  MS:  No joint pain or swelling.  No decreased range of  motion.  No back pain.  Psych:  No change in mood or affect. No depression or anxiety.  No memory loss.     Objective:   Physical Exam  Filed Vitals:   11/25/12 1509  BP: 160/92  Pulse: 80  Temp: 97.8 F (36.6 C)  TempSrc: Oral  Height: _0  (1.753 m)  Weight: 72.394 kg (159 lb 9.6 oz)  SpO2: 99%    Gen: Pleasant, well-nourished, in no distress,  normal affect  ENT: No lesions,  mouth clear,  oropharynx clear, no postnasal drip  Neck: No JVD, no TMG, no carotid bruits  Lungs: No use of accessory muscles, no dullness to percussion, clear without rales or rhonchi  Cardiovascular: irreg irreg  heart sounds normal, no murmur or gallops, no peripheral edema  Abdomen: soft and NT, no HSM,  BS normal  Musculoskeletal: No deformities, no cyanosis or clubbing  Neuro: alert, non focal  Skin: Warm, no lesions or rashes  Sleep study reviewed: revealed AHI 80 mild desaturation, no RLSM.    All recent cardiac studies reviewed and are in Epic .     Assessment & Plan:   OSA (obstructive sleep apnea) Moderate sleep apnea The pt declined an oral appliance in the past but will now get one made and use Note PFTs essentially normal.  No primary lung disease and no contraindication for surgery pulm wise Plan Obtain oral appliance Records to CV surgeon in [facility] for planned complex cardiac surgery The pt is cleared for CV surgery from pulm perspective  Chronic systolic heart failure Stable heart failure Plan Per cardiology  Dyspnea on exertion Dyspnea on exertion Note Normal PFTs:  FeV1>100% FVC >100% TLC >100%  DLCO normal.     Updated Medication List Outpatient Encounter Prescriptions as of 11/25/2012  Medication Sig Dispense Refill  . COUMADIN 5 MG tablet TAKE AS DIRECTED.  40 tablet  3  . dofetilide (TIKOSYN) 125 MCG capsule Take 125 mcg by mouth 2 (two) times daily.      . metoprolol succinate (TOPROL-XL) 25 MG 24 hr tablet Take 1 tablet (25 mg total) by  mouth 2 (two) times daily.  180 tablet  1  . nitroGLYCERIN (NITROSTAT) 0.4 MG SL tablet Place 0.4 mg under the tongue every 5 (five) minutes  as needed. May repeat for up to 3 doses.       . Omega-3 Fatty Acids (THE VERY FINEST FISH OIL) LIQD Take 2-3 g by mouth daily.       Marland Kitchen RANEXA 500 MG 12 hr tablet Take 500 mg by mouth 2 (two) times daily.       . valsartan (DIOVAN) 40 MG tablet Take 20 mg by mouth 2 (two) times daily.      . [DISCONTINUED] valsartan (DIOVAN) 40 MG tablet Take 1 tablet (40 mg total) by mouth 2 (two) times daily.  180 tablet  3  . furosemide (LASIX) 20 MG tablet Take 20 mg by mouth as needed.      . Potassium Chloride ER 20 MEQ TBCR Take 20 mEq by mouth as needed.      . [DISCONTINUED] enoxaparin (LOVENOX) 100 MG/ML injection Inject 1 mL (100 mg total) into the skin daily.  10 Syringe  1   No facility-administered encounter medications on file as of 11/25/2012.

## 2012-11-25 NOTE — Telephone Encounter (Signed)
I called and spoke with Sharyn Lull. Pt is scheduled for PFT at 1:30 over at [facility].  I called pt and spoke spouse. She took down the appt information and will relay this to pt. Nothing further needed

## 2012-11-25 NOTE — Assessment & Plan Note (Signed)
Stable heart failure Plan Per cardiology

## 2012-11-25 NOTE — Patient Instructions (Addendum)
Use lasix as needed Consider getting the dental appliance made before surgery Return as needed You are cleared lung wise for planned heart surgery Flu vaccine

## 2012-11-28 ENCOUNTER — Encounter (INDEPENDENT_AMBULATORY_CARE_PROVIDER_SITE_OTHER): Payer: Medicare Other

## 2012-11-28 ENCOUNTER — Ambulatory Visit (INDEPENDENT_AMBULATORY_CARE_PROVIDER_SITE_OTHER): Payer: Medicare Other | Admitting: *Deleted

## 2012-11-28 DIAGNOSIS — I2510 Atherosclerotic heart disease of native coronary artery without angina pectoris: Secondary | ICD-10-CM

## 2012-11-28 DIAGNOSIS — G459 Transient cerebral ischemic attack, unspecified: Secondary | ICD-10-CM

## 2012-11-28 DIAGNOSIS — I480 Paroxysmal atrial fibrillation: Secondary | ICD-10-CM

## 2012-11-28 DIAGNOSIS — R42 Dizziness and giddiness: Secondary | ICD-10-CM | POA: Diagnosis not present

## 2012-11-28 DIAGNOSIS — Z7901 Long term (current) use of anticoagulants: Secondary | ICD-10-CM | POA: Diagnosis not present

## 2012-11-28 DIAGNOSIS — I4891 Unspecified atrial fibrillation: Secondary | ICD-10-CM

## 2012-11-28 DIAGNOSIS — I6529 Occlusion and stenosis of unspecified carotid artery: Secondary | ICD-10-CM | POA: Diagnosis not present

## 2012-11-28 DIAGNOSIS — I340 Nonrheumatic mitral (valve) insufficiency: Secondary | ICD-10-CM

## 2012-11-28 DIAGNOSIS — Z139 Encounter for screening, unspecified: Secondary | ICD-10-CM

## 2012-11-28 DIAGNOSIS — I5022 Chronic systolic (congestive) heart failure: Secondary | ICD-10-CM

## 2012-12-09 ENCOUNTER — Telehealth: Payer: Self-pay | Admitting: Cardiovascular Disease

## 2012-12-09 NOTE — Telephone Encounter (Signed)
I spoke with the pt and made him aware of carotid results.  I did fax his carotid report to Attn: Franz Dell, [phone].

## 2012-12-09 NOTE — Telephone Encounter (Signed)
New Problem  Pt has a Carotid echo done and asks was the results sent  to [facility]  in [facility]   Did he interpret it and send it or did he just send the results...  If he did interpret it, What were the results. Please call back

## 2012-12-14 ENCOUNTER — Telehealth: Payer: Self-pay | Admitting: Cardiovascular Disease

## 2012-12-14 NOTE — Telephone Encounter (Signed)
New problem    Is requesting a brief conversation with Dr. Burt Knack - this coming Friday would be good for him. Or tomorrow after noon after 4 pm .

## 2012-12-19 NOTE — Telephone Encounter (Signed)
I spoke with the pt and he is scheduled to leave for [place] on the 15th and have surgery on October 20th.  The pt states he has had issues with lovenox bridging in the past and was wondering about switching his anticoagulant.  The pt said he has lost weight over the past month and does not have a "belly" for injections.  The pt was wondering if he could switch to Xarelto (pt said he is not really allergic to this medication, sometimes develops a rash on leg) in preparation for this procedure.  I advised the pt to touch base with the surgeons office about bridging to determine how they handle this prior to surgery. The pt will call back with this information.     The pt has also noticed and increase in episodes of dizziness over the past month and feels this is related to Ranexa and Tikosyn. The pt would like to know if he should come into the office for an EKG to measure his QT interval. Also the pt would like to know if the Ranexa should be stopped and what medication will take the place. The pt denies AFib at this time.

## 2012-12-19 NOTE — Telephone Encounter (Signed)
New Problem   1. Change anti meds. 2. Dizzy and faint side effects increasing w/  RA and EXA meds and asks is there any problems with that? 3. Does Dr. Burt Knack need to see him for a cardiogram to measure the QT Intervral.

## 2012-12-22 ENCOUNTER — Telehealth: Payer: Self-pay | Admitting: Internal Medicine

## 2012-12-22 NOTE — Telephone Encounter (Signed)
New Problem  Pt is having surgery// needs instruction for the lovenox bridge// needs a call back for specifics.

## 2012-12-23 NOTE — Telephone Encounter (Signed)
Spoke with patient and advised him per Dr. Olin Pia instruction - he is to have the surgeons in [place], in charge of his surgery, be the ones to make decisions about bridging patient.  Patient states that his dizziness has improved and doesn't feel need for EKG at this time. I advised him to call us and we would perform EKG to check his rhythm per his request.  Patient agreeable to plan.

## 2012-12-26 ENCOUNTER — Ambulatory Visit (INDEPENDENT_AMBULATORY_CARE_PROVIDER_SITE_OTHER): Payer: Medicare Other | Admitting: *Deleted

## 2012-12-26 DIAGNOSIS — I4891 Unspecified atrial fibrillation: Secondary | ICD-10-CM

## 2012-12-26 DIAGNOSIS — Z7901 Long term (current) use of anticoagulants: Secondary | ICD-10-CM | POA: Diagnosis not present

## 2012-12-26 DIAGNOSIS — Z5181 Encounter for therapeutic drug level monitoring: Secondary | ICD-10-CM

## 2012-12-26 LAB — POCT INR: INR: 1.8

## 2012-12-26 NOTE — Telephone Encounter (Signed)
Dr Burt Knack wanted the pt's issues addressed by Dr Caryl Comes and they were on 12/23/12.    Caleb Kidney, RN at 12/23/2012 2:03 PM   Status: Signed            Spoke with patient and advised him per Dr. Olin Pia instruction - he is to have the surgeons in [place], in charge of his surgery, be the ones to make decisions about bridging patient.  Patient states that his dizziness has improved and doesn't feel need for EKG at this time. I advised him to call us and we would perform EKG to check his rhythm per his request. Patient agreeable to plan.

## 2012-12-28 ENCOUNTER — Telehealth: Payer: Self-pay | Admitting: Critical Care Medicine

## 2012-12-28 NOTE — Telephone Encounter (Signed)
I spoke with pt and made him aware. Nothing further needed

## 2012-12-28 NOTE — Telephone Encounter (Signed)
lmomtcb x1 for pt--received confirmation this went through on our end

## 2012-12-29 ENCOUNTER — Telehealth: Payer: Self-pay | Admitting: *Deleted

## 2012-12-29 ENCOUNTER — Encounter: Payer: Self-pay | Admitting: Cardiovascular Disease

## 2012-12-29 MED ORDER — ENOXAPARIN SODIUM 80 MG/0.8ML ~~LOC~~ SOLN: 80.0000 mg | Freq: Two times a day (BID) | SUBCUTANEOUS | Status: DC

## 2012-12-29 NOTE — Telephone Encounter (Signed)
PA from hospital in [place] spoke with [name] to discuss bridging patient.  Per [name] we are to order Lovenox to bridge patient, so that he may take with him to [place]. Lovenox for 5 days.

## 2012-12-29 NOTE — Telephone Encounter (Signed)
Pt's weight's 72kg.  SCr-0.9.  Will dose at 46m BID.  [facility]

## 2013-01-03 ENCOUNTER — Encounter: Payer: Self-pay | Admitting: Cardiovascular Disease

## 2013-01-03 NOTE — Telephone Encounter (Signed)
  This encounter was created in error - please disregard.

## 2013-01-03 NOTE — Telephone Encounter (Signed)
New problem:  Pt states it is urgent that he speak to Dr. Burt Knack. Please advise

## 2013-01-05 DIAGNOSIS — I77810 Thoracic aortic ectasia: Secondary | ICD-10-CM | POA: Diagnosis not present

## 2013-01-05 DIAGNOSIS — Z01811 Encounter for preprocedural respiratory examination: Secondary | ICD-10-CM | POA: Diagnosis not present

## 2013-01-05 DIAGNOSIS — I712 Thoracic aortic aneurysm, without rupture, unspecified: Secondary | ICD-10-CM | POA: Diagnosis not present

## 2013-01-05 DIAGNOSIS — I5020 Unspecified systolic (congestive) heart failure: Secondary | ICD-10-CM | POA: Diagnosis not present

## 2013-01-05 DIAGNOSIS — Z01818 Encounter for other preprocedural examination: Secondary | ICD-10-CM | POA: Diagnosis not present

## 2013-01-05 DIAGNOSIS — I2510 Atherosclerotic heart disease of native coronary artery without angina pectoris: Secondary | ICD-10-CM | POA: Diagnosis not present

## 2013-01-05 DIAGNOSIS — I509 Heart failure, unspecified: Secondary | ICD-10-CM | POA: Diagnosis not present

## 2013-01-05 DIAGNOSIS — I700 Atherosclerosis of aorta: Secondary | ICD-10-CM | POA: Diagnosis not present

## 2013-01-05 DIAGNOSIS — I369 Nonrheumatic tricuspid valve disorder, unspecified: Secondary | ICD-10-CM | POA: Diagnosis not present

## 2013-01-05 DIAGNOSIS — I079 Rheumatic tricuspid valve disease, unspecified: Secondary | ICD-10-CM | POA: Diagnosis not present

## 2013-01-05 DIAGNOSIS — Z01810 Encounter for preprocedural cardiovascular examination: Secondary | ICD-10-CM | POA: Diagnosis not present

## 2013-01-05 DIAGNOSIS — I359 Nonrheumatic aortic valve disorder, unspecified: Secondary | ICD-10-CM | POA: Diagnosis not present

## 2013-01-05 DIAGNOSIS — I2789 Other specified pulmonary heart diseases: Secondary | ICD-10-CM | POA: Diagnosis not present

## 2013-01-05 DIAGNOSIS — I059 Rheumatic mitral valve disease, unspecified: Secondary | ICD-10-CM | POA: Diagnosis not present

## 2013-01-06 DIAGNOSIS — K219 Gastro-esophageal reflux disease without esophagitis: Secondary | ICD-10-CM | POA: Insufficient documentation

## 2013-01-06 DIAGNOSIS — Z951 Presence of aortocoronary bypass graft: Secondary | ICD-10-CM | POA: Diagnosis not present

## 2013-01-06 DIAGNOSIS — I059 Rheumatic mitral valve disease, unspecified: Secondary | ICD-10-CM | POA: Diagnosis not present

## 2013-01-06 DIAGNOSIS — I719 Aortic aneurysm of unspecified site, without rupture: Secondary | ICD-10-CM | POA: Diagnosis not present

## 2013-01-06 DIAGNOSIS — Z7901 Long term (current) use of anticoagulants: Secondary | ICD-10-CM | POA: Insufficient documentation

## 2013-01-06 DIAGNOSIS — E785 Hyperlipidemia, unspecified: Secondary | ICD-10-CM | POA: Diagnosis present

## 2013-01-06 DIAGNOSIS — I482 Chronic atrial fibrillation, unspecified: Secondary | ICD-10-CM | POA: Insufficient documentation

## 2013-01-06 DIAGNOSIS — Z4682 Encounter for fitting and adjustment of non-vascular catheter: Secondary | ICD-10-CM | POA: Diagnosis not present

## 2013-01-06 DIAGNOSIS — I10 Essential (primary) hypertension: Secondary | ICD-10-CM | POA: Diagnosis present

## 2013-01-06 DIAGNOSIS — I428 Other cardiomyopathies: Secondary | ICD-10-CM | POA: Diagnosis not present

## 2013-01-06 DIAGNOSIS — I4891 Unspecified atrial fibrillation: Secondary | ICD-10-CM | POA: Diagnosis not present

## 2013-01-06 DIAGNOSIS — I499 Cardiac arrhythmia, unspecified: Secondary | ICD-10-CM | POA: Diagnosis not present

## 2013-01-06 DIAGNOSIS — E8779 Other fluid overload: Secondary | ICD-10-CM | POA: Diagnosis not present

## 2013-01-06 DIAGNOSIS — Z888 Allergy status to other drugs, medicaments and biological substances status: Secondary | ICD-10-CM | POA: Diagnosis not present

## 2013-01-06 DIAGNOSIS — E871 Hypo-osmolality and hyponatremia: Secondary | ICD-10-CM | POA: Diagnosis not present

## 2013-01-06 DIAGNOSIS — Z9911 Dependence on respirator [ventilator] status: Secondary | ICD-10-CM | POA: Diagnosis not present

## 2013-01-06 DIAGNOSIS — R9431 Abnormal electrocardiogram [ECG] [EKG]: Secondary | ICD-10-CM | POA: Diagnosis not present

## 2013-01-06 DIAGNOSIS — I351 Nonrheumatic aortic (valve) insufficiency: Secondary | ICD-10-CM | POA: Insufficient documentation

## 2013-01-06 DIAGNOSIS — G939 Disorder of brain, unspecified: Secondary | ICD-10-CM | POA: Insufficient documentation

## 2013-01-06 DIAGNOSIS — I2510 Atherosclerotic heart disease of native coronary artery without angina pectoris: Secondary | ICD-10-CM | POA: Diagnosis not present

## 2013-01-06 DIAGNOSIS — Z9889 Other specified postprocedural states: Secondary | ICD-10-CM | POA: Insufficient documentation

## 2013-01-06 DIAGNOSIS — I97190 Other postprocedural cardiac functional disturbances following cardiac surgery: Secondary | ICD-10-CM | POA: Diagnosis not present

## 2013-01-06 DIAGNOSIS — I6782 Cerebral ischemia: Secondary | ICD-10-CM | POA: Insufficient documentation

## 2013-01-06 DIAGNOSIS — I340 Nonrheumatic mitral (valve) insufficiency: Secondary | ICD-10-CM | POA: Insufficient documentation

## 2013-01-06 DIAGNOSIS — R066 Hiccough: Secondary | ICD-10-CM | POA: Diagnosis not present

## 2013-01-06 DIAGNOSIS — I2789 Other specified pulmonary heart diseases: Secondary | ICD-10-CM | POA: Diagnosis present

## 2013-01-06 DIAGNOSIS — J309 Allergic rhinitis, unspecified: Secondary | ICD-10-CM | POA: Diagnosis present

## 2013-01-06 DIAGNOSIS — I509 Heart failure, unspecified: Secondary | ICD-10-CM | POA: Diagnosis not present

## 2013-01-06 DIAGNOSIS — I272 Pulmonary hypertension, unspecified: Secondary | ICD-10-CM | POA: Insufficient documentation

## 2013-01-06 DIAGNOSIS — Z954 Presence of other heart-valve replacement: Secondary | ICD-10-CM | POA: Diagnosis not present

## 2013-01-06 DIAGNOSIS — D62 Acute posthemorrhagic anemia: Secondary | ICD-10-CM | POA: Diagnosis not present

## 2013-01-06 DIAGNOSIS — I2589 Other forms of chronic ischemic heart disease: Secondary | ICD-10-CM | POA: Diagnosis present

## 2013-01-06 DIAGNOSIS — I5020 Unspecified systolic (congestive) heart failure: Secondary | ICD-10-CM | POA: Insufficient documentation

## 2013-01-06 DIAGNOSIS — N179 Acute kidney failure, unspecified: Secondary | ICD-10-CM | POA: Diagnosis not present

## 2013-01-06 DIAGNOSIS — Z955 Presence of coronary angioplasty implant and graft: Secondary | ICD-10-CM | POA: Insufficient documentation

## 2013-01-06 DIAGNOSIS — J9600 Acute respiratory failure, unspecified whether with hypoxia or hypercapnia: Secondary | ICD-10-CM | POA: Diagnosis not present

## 2013-01-06 DIAGNOSIS — I429 Cardiomyopathy, unspecified: Secondary | ICD-10-CM | POA: Insufficient documentation

## 2013-01-06 DIAGNOSIS — I5021 Acute systolic (congestive) heart failure: Secondary | ICD-10-CM | POA: Diagnosis not present

## 2013-01-06 DIAGNOSIS — Z9861 Coronary angioplasty status: Secondary | ICD-10-CM | POA: Diagnosis not present

## 2013-01-06 DIAGNOSIS — I519 Heart disease, unspecified: Secondary | ICD-10-CM | POA: Diagnosis not present

## 2013-01-06 DIAGNOSIS — Z8673 Personal history of transient ischemic attack (TIA), and cerebral infarction without residual deficits: Secondary | ICD-10-CM | POA: Diagnosis not present

## 2013-01-06 DIAGNOSIS — I712 Thoracic aortic aneurysm, without rupture, unspecified: Secondary | ICD-10-CM | POA: Diagnosis not present

## 2013-01-06 DIAGNOSIS — R570 Cardiogenic shock: Secondary | ICD-10-CM | POA: Diagnosis not present

## 2013-01-06 DIAGNOSIS — I7409 Other arterial embolism and thrombosis of abdominal aorta: Secondary | ICD-10-CM | POA: Diagnosis not present

## 2013-01-06 DIAGNOSIS — I498 Other specified cardiac arrhythmias: Secondary | ICD-10-CM | POA: Diagnosis not present

## 2013-01-06 DIAGNOSIS — J95821 Acute postprocedural respiratory failure: Secondary | ICD-10-CM | POA: Diagnosis not present

## 2013-01-06 DIAGNOSIS — Z09 Encounter for follow-up examination after completed treatment for conditions other than malignant neoplasm: Secondary | ICD-10-CM | POA: Diagnosis not present

## 2013-01-06 DIAGNOSIS — I359 Nonrheumatic aortic valve disorder, unspecified: Secondary | ICD-10-CM | POA: Diagnosis not present

## 2013-01-06 DIAGNOSIS — I729 Aneurysm of unspecified site: Secondary | ICD-10-CM | POA: Diagnosis not present

## 2013-01-06 DIAGNOSIS — I252 Old myocardial infarction: Secondary | ICD-10-CM | POA: Diagnosis not present

## 2013-01-06 DIAGNOSIS — G4733 Obstructive sleep apnea (adult) (pediatric): Secondary | ICD-10-CM | POA: Diagnosis not present

## 2013-01-06 DIAGNOSIS — I080 Rheumatic disorders of both mitral and aortic valves: Secondary | ICD-10-CM | POA: Diagnosis present

## 2013-01-06 DIAGNOSIS — Z882 Allergy status to sulfonamides status: Secondary | ICD-10-CM | POA: Diagnosis not present

## 2013-01-09 DIAGNOSIS — I719 Aortic aneurysm of unspecified site, without rupture: Secondary | ICD-10-CM | POA: Insufficient documentation

## 2013-01-09 HISTORY — PX: AORTIC VALVE REPAIR: SHX6306

## 2013-01-09 HISTORY — PX: MITRAL VALVE ANNULOPLASTY: SHX2038

## 2013-01-09 HISTORY — PX: CORONARY ARTERY BYPASS GRAFT: SHX141

## 2013-01-17 DIAGNOSIS — Z9889 Other specified postprocedural states: Secondary | ICD-10-CM | POA: Insufficient documentation

## 2013-01-17 DIAGNOSIS — Z952 Presence of prosthetic heart valve: Secondary | ICD-10-CM | POA: Insufficient documentation

## 2013-01-18 DIAGNOSIS — I10 Essential (primary) hypertension: Secondary | ICD-10-CM | POA: Diagnosis not present

## 2013-01-18 DIAGNOSIS — Z48812 Encounter for surgical aftercare following surgery on the circulatory system: Secondary | ICD-10-CM | POA: Diagnosis not present

## 2013-01-18 DIAGNOSIS — I2789 Other specified pulmonary heart diseases: Secondary | ICD-10-CM | POA: Diagnosis not present

## 2013-01-18 DIAGNOSIS — I428 Other cardiomyopathies: Secondary | ICD-10-CM | POA: Diagnosis not present

## 2013-01-18 DIAGNOSIS — Z8673 Personal history of transient ischemic attack (TIA), and cerebral infarction without residual deficits: Secondary | ICD-10-CM | POA: Diagnosis not present

## 2013-01-18 DIAGNOSIS — I5030 Unspecified diastolic (congestive) heart failure: Secondary | ICD-10-CM | POA: Diagnosis not present

## 2013-01-18 DIAGNOSIS — Z951 Presence of aortocoronary bypass graft: Secondary | ICD-10-CM | POA: Diagnosis not present

## 2013-01-18 DIAGNOSIS — I2510 Atherosclerotic heart disease of native coronary artery without angina pectoris: Secondary | ICD-10-CM | POA: Diagnosis not present

## 2013-01-19 DIAGNOSIS — I428 Other cardiomyopathies: Secondary | ICD-10-CM | POA: Diagnosis not present

## 2013-01-19 DIAGNOSIS — I359 Nonrheumatic aortic valve disorder, unspecified: Secondary | ICD-10-CM | POA: Diagnosis not present

## 2013-01-19 DIAGNOSIS — I10 Essential (primary) hypertension: Secondary | ICD-10-CM | POA: Diagnosis not present

## 2013-01-19 DIAGNOSIS — Z7901 Long term (current) use of anticoagulants: Secondary | ICD-10-CM | POA: Diagnosis not present

## 2013-01-19 DIAGNOSIS — I5030 Unspecified diastolic (congestive) heart failure: Secondary | ICD-10-CM | POA: Diagnosis not present

## 2013-01-19 DIAGNOSIS — Z48812 Encounter for surgical aftercare following surgery on the circulatory system: Secondary | ICD-10-CM | POA: Diagnosis not present

## 2013-01-19 DIAGNOSIS — I2789 Other specified pulmonary heart diseases: Secondary | ICD-10-CM | POA: Diagnosis not present

## 2013-01-19 DIAGNOSIS — I4891 Unspecified atrial fibrillation: Secondary | ICD-10-CM | POA: Diagnosis not present

## 2013-01-19 DIAGNOSIS — I2510 Atherosclerotic heart disease of native coronary artery without angina pectoris: Secondary | ICD-10-CM | POA: Diagnosis not present

## 2013-01-20 DIAGNOSIS — Z48812 Encounter for surgical aftercare following surgery on the circulatory system: Secondary | ICD-10-CM | POA: Diagnosis not present

## 2013-01-20 DIAGNOSIS — I428 Other cardiomyopathies: Secondary | ICD-10-CM | POA: Diagnosis not present

## 2013-01-20 DIAGNOSIS — I2510 Atherosclerotic heart disease of native coronary artery without angina pectoris: Secondary | ICD-10-CM | POA: Diagnosis not present

## 2013-01-20 DIAGNOSIS — I5030 Unspecified diastolic (congestive) heart failure: Secondary | ICD-10-CM | POA: Diagnosis not present

## 2013-01-20 DIAGNOSIS — I2789 Other specified pulmonary heart diseases: Secondary | ICD-10-CM | POA: Diagnosis not present

## 2013-01-20 DIAGNOSIS — I10 Essential (primary) hypertension: Secondary | ICD-10-CM | POA: Diagnosis not present

## 2013-01-21 DIAGNOSIS — I2510 Atherosclerotic heart disease of native coronary artery without angina pectoris: Secondary | ICD-10-CM | POA: Diagnosis not present

## 2013-01-21 DIAGNOSIS — I5030 Unspecified diastolic (congestive) heart failure: Secondary | ICD-10-CM | POA: Diagnosis not present

## 2013-01-21 DIAGNOSIS — I10 Essential (primary) hypertension: Secondary | ICD-10-CM | POA: Diagnosis not present

## 2013-01-21 DIAGNOSIS — I428 Other cardiomyopathies: Secondary | ICD-10-CM | POA: Diagnosis not present

## 2013-01-21 DIAGNOSIS — I2789 Other specified pulmonary heart diseases: Secondary | ICD-10-CM | POA: Diagnosis not present

## 2013-01-21 DIAGNOSIS — Z48812 Encounter for surgical aftercare following surgery on the circulatory system: Secondary | ICD-10-CM | POA: Diagnosis not present

## 2013-01-22 DIAGNOSIS — D72829 Elevated white blood cell count, unspecified: Secondary | ICD-10-CM | POA: Diagnosis present

## 2013-01-22 DIAGNOSIS — I252 Old myocardial infarction: Secondary | ICD-10-CM | POA: Diagnosis not present

## 2013-01-22 DIAGNOSIS — E785 Hyperlipidemia, unspecified: Secondary | ICD-10-CM | POA: Diagnosis present

## 2013-01-22 DIAGNOSIS — Z7982 Long term (current) use of aspirin: Secondary | ICD-10-CM | POA: Diagnosis not present

## 2013-01-22 DIAGNOSIS — Z951 Presence of aortocoronary bypass graft: Secondary | ICD-10-CM | POA: Diagnosis not present

## 2013-01-22 DIAGNOSIS — I2789 Other specified pulmonary heart diseases: Secondary | ICD-10-CM | POA: Diagnosis not present

## 2013-01-22 DIAGNOSIS — R0602 Shortness of breath: Secondary | ICD-10-CM | POA: Diagnosis not present

## 2013-01-22 DIAGNOSIS — R7309 Other abnormal glucose: Secondary | ICD-10-CM | POA: Diagnosis present

## 2013-01-22 DIAGNOSIS — I5043 Acute on chronic combined systolic (congestive) and diastolic (congestive) heart failure: Secondary | ICD-10-CM | POA: Diagnosis present

## 2013-01-22 DIAGNOSIS — I4891 Unspecified atrial fibrillation: Secondary | ICD-10-CM | POA: Diagnosis not present

## 2013-01-22 DIAGNOSIS — Z882 Allergy status to sulfonamides status: Secondary | ICD-10-CM | POA: Diagnosis not present

## 2013-01-22 DIAGNOSIS — I498 Other specified cardiac arrhythmias: Secondary | ICD-10-CM | POA: Diagnosis not present

## 2013-01-22 DIAGNOSIS — E875 Hyperkalemia: Secondary | ICD-10-CM | POA: Diagnosis present

## 2013-01-22 DIAGNOSIS — J90 Pleural effusion, not elsewhere classified: Secondary | ICD-10-CM | POA: Diagnosis not present

## 2013-01-22 DIAGNOSIS — I509 Heart failure, unspecified: Secondary | ICD-10-CM | POA: Diagnosis not present

## 2013-01-22 DIAGNOSIS — Z79899 Other long term (current) drug therapy: Secondary | ICD-10-CM | POA: Diagnosis not present

## 2013-01-22 DIAGNOSIS — I2510 Atherosclerotic heart disease of native coronary artery without angina pectoris: Secondary | ICD-10-CM | POA: Diagnosis not present

## 2013-01-22 DIAGNOSIS — I455 Other specified heart block: Secondary | ICD-10-CM | POA: Diagnosis not present

## 2013-01-22 DIAGNOSIS — M7989 Other specified soft tissue disorders: Secondary | ICD-10-CM | POA: Diagnosis not present

## 2013-01-22 DIAGNOSIS — E871 Hypo-osmolality and hyponatremia: Secondary | ICD-10-CM | POA: Diagnosis not present

## 2013-01-22 DIAGNOSIS — J9819 Other pulmonary collapse: Secondary | ICD-10-CM | POA: Diagnosis present

## 2013-01-22 DIAGNOSIS — I080 Rheumatic disorders of both mitral and aortic valves: Secondary | ICD-10-CM | POA: Diagnosis present

## 2013-01-22 DIAGNOSIS — J988 Other specified respiratory disorders: Secondary | ICD-10-CM | POA: Diagnosis present

## 2013-01-22 DIAGNOSIS — R9431 Abnormal electrocardiogram [ECG] [EKG]: Secondary | ICD-10-CM | POA: Diagnosis not present

## 2013-01-22 DIAGNOSIS — Z7901 Long term (current) use of anticoagulants: Secondary | ICD-10-CM | POA: Diagnosis not present

## 2013-01-25 ENCOUNTER — Encounter: Payer: Self-pay | Admitting: Critical Care Medicine

## 2013-01-25 DIAGNOSIS — Z48812 Encounter for surgical aftercare following surgery on the circulatory system: Secondary | ICD-10-CM | POA: Diagnosis not present

## 2013-01-25 DIAGNOSIS — I428 Other cardiomyopathies: Secondary | ICD-10-CM | POA: Diagnosis not present

## 2013-01-25 DIAGNOSIS — I10 Essential (primary) hypertension: Secondary | ICD-10-CM | POA: Diagnosis not present

## 2013-01-25 DIAGNOSIS — I2789 Other specified pulmonary heart diseases: Secondary | ICD-10-CM | POA: Diagnosis not present

## 2013-01-25 DIAGNOSIS — I5030 Unspecified diastolic (congestive) heart failure: Secondary | ICD-10-CM | POA: Diagnosis not present

## 2013-01-25 DIAGNOSIS — I2510 Atherosclerotic heart disease of native coronary artery without angina pectoris: Secondary | ICD-10-CM | POA: Diagnosis not present

## 2013-01-26 DIAGNOSIS — Z48812 Encounter for surgical aftercare following surgery on the circulatory system: Secondary | ICD-10-CM | POA: Diagnosis not present

## 2013-01-26 DIAGNOSIS — I2510 Atherosclerotic heart disease of native coronary artery without angina pectoris: Secondary | ICD-10-CM | POA: Diagnosis not present

## 2013-01-26 DIAGNOSIS — I10 Essential (primary) hypertension: Secondary | ICD-10-CM | POA: Diagnosis not present

## 2013-01-26 DIAGNOSIS — I428 Other cardiomyopathies: Secondary | ICD-10-CM | POA: Diagnosis not present

## 2013-01-26 DIAGNOSIS — I2789 Other specified pulmonary heart diseases: Secondary | ICD-10-CM | POA: Diagnosis not present

## 2013-01-26 DIAGNOSIS — I5030 Unspecified diastolic (congestive) heart failure: Secondary | ICD-10-CM | POA: Diagnosis not present

## 2013-01-27 DIAGNOSIS — I059 Rheumatic mitral valve disease, unspecified: Secondary | ICD-10-CM | POA: Diagnosis not present

## 2013-01-27 DIAGNOSIS — I499 Cardiac arrhythmia, unspecified: Secondary | ICD-10-CM | POA: Diagnosis not present

## 2013-01-27 DIAGNOSIS — I509 Heart failure, unspecified: Secondary | ICD-10-CM | POA: Diagnosis not present

## 2013-01-27 DIAGNOSIS — I359 Nonrheumatic aortic valve disorder, unspecified: Secondary | ICD-10-CM | POA: Diagnosis not present

## 2013-01-28 DIAGNOSIS — I2510 Atherosclerotic heart disease of native coronary artery without angina pectoris: Secondary | ICD-10-CM | POA: Diagnosis not present

## 2013-01-28 DIAGNOSIS — I2789 Other specified pulmonary heart diseases: Secondary | ICD-10-CM | POA: Diagnosis not present

## 2013-01-28 DIAGNOSIS — Z48812 Encounter for surgical aftercare following surgery on the circulatory system: Secondary | ICD-10-CM | POA: Diagnosis not present

## 2013-01-28 DIAGNOSIS — I428 Other cardiomyopathies: Secondary | ICD-10-CM | POA: Diagnosis not present

## 2013-01-28 DIAGNOSIS — I10 Essential (primary) hypertension: Secondary | ICD-10-CM | POA: Diagnosis not present

## 2013-01-28 DIAGNOSIS — I5030 Unspecified diastolic (congestive) heart failure: Secondary | ICD-10-CM | POA: Diagnosis not present

## 2013-01-30 DIAGNOSIS — I2789 Other specified pulmonary heart diseases: Secondary | ICD-10-CM | POA: Diagnosis not present

## 2013-01-30 DIAGNOSIS — I428 Other cardiomyopathies: Secondary | ICD-10-CM | POA: Diagnosis not present

## 2013-01-30 DIAGNOSIS — I5030 Unspecified diastolic (congestive) heart failure: Secondary | ICD-10-CM | POA: Diagnosis not present

## 2013-01-30 DIAGNOSIS — Z48812 Encounter for surgical aftercare following surgery on the circulatory system: Secondary | ICD-10-CM | POA: Diagnosis not present

## 2013-01-30 DIAGNOSIS — I2510 Atherosclerotic heart disease of native coronary artery without angina pectoris: Secondary | ICD-10-CM | POA: Diagnosis not present

## 2013-01-30 DIAGNOSIS — I10 Essential (primary) hypertension: Secondary | ICD-10-CM | POA: Diagnosis not present

## 2013-01-31 DIAGNOSIS — I499 Cardiac arrhythmia, unspecified: Secondary | ICD-10-CM | POA: Diagnosis not present

## 2013-01-31 DIAGNOSIS — I509 Heart failure, unspecified: Secondary | ICD-10-CM | POA: Diagnosis not present

## 2013-02-08 ENCOUNTER — Encounter: Payer: Self-pay | Admitting: Critical Care Medicine

## 2013-02-08 ENCOUNTER — Ambulatory Visit (INDEPENDENT_AMBULATORY_CARE_PROVIDER_SITE_OTHER)
Admission: RE | Admit: 2013-02-08 | Discharge: 2013-02-08 | Disposition: A | Discharge status: Home / Self Care | Payer: Medicare Other | Source: Ambulatory Visit | Attending: Critical Care Medicine | Admitting: Critical Care Medicine

## 2013-02-08 ENCOUNTER — Other Ambulatory Visit (INDEPENDENT_AMBULATORY_CARE_PROVIDER_SITE_OTHER): Payer: Medicare Other

## 2013-02-08 ENCOUNTER — Ambulatory Visit (INDEPENDENT_AMBULATORY_CARE_PROVIDER_SITE_OTHER): Payer: Medicare Other | Admitting: Critical Care Medicine

## 2013-02-08 VITALS — BP 130/68 | HR 83 | Temp 97.9°F | Ht 70.0 in | Wt 159.6 lb

## 2013-02-08 DIAGNOSIS — I4891 Unspecified atrial fibrillation: Secondary | ICD-10-CM

## 2013-02-08 DIAGNOSIS — R0989 Other specified symptoms and signs involving the circulatory and respiratory systems: Secondary | ICD-10-CM | POA: Diagnosis not present

## 2013-02-08 DIAGNOSIS — I2510 Atherosclerotic heart disease of native coronary artery without angina pectoris: Secondary | ICD-10-CM | POA: Diagnosis not present

## 2013-02-08 DIAGNOSIS — R0609 Other forms of dyspnea: Secondary | ICD-10-CM | POA: Diagnosis not present

## 2013-02-08 DIAGNOSIS — J90 Pleural effusion, not elsewhere classified: Secondary | ICD-10-CM

## 2013-02-08 DIAGNOSIS — Z9889 Other specified postprocedural states: Secondary | ICD-10-CM

## 2013-02-08 DIAGNOSIS — I2589 Other forms of chronic ischemic heart disease: Secondary | ICD-10-CM

## 2013-02-08 DIAGNOSIS — Z954 Presence of other heart-valve replacement: Secondary | ICD-10-CM

## 2013-02-08 DIAGNOSIS — Z952 Presence of prosthetic heart valve: Secondary | ICD-10-CM

## 2013-02-08 DIAGNOSIS — Z7901 Long term (current) use of anticoagulants: Secondary | ICD-10-CM

## 2013-02-08 DIAGNOSIS — Z951 Presence of aortocoronary bypass graft: Secondary | ICD-10-CM

## 2013-02-08 LAB — PROTIME-INR
INR: 2.3 ratio — ABNORMAL HIGH (ref 0.8–1.0)
Prothrombin Time: 24.2 s — ABNORMAL HIGH (ref 10.2–12.4)

## 2013-02-08 LAB — RENAL FUNCTION PANEL
Albumin: 3.6 g/dL (ref 3.5–5.2)
BUN: 18 mg/dL (ref 6–23)
CO2: 28 mEq/L (ref 19–32)
Calcium: 9 mg/dL (ref 8.4–10.5)
Chloride: 97 mEq/L (ref 96–112)
Creatinine, Ser: 1 mg/dL (ref 0.4–1.5)
GFR: 79.4 mL/min (ref >60.00)
Glucose, Bld: 101 mg/dL — ABNORMAL HIGH (ref 70–99)
Phosphorus: 3.7 mg/dL (ref 2.3–4.6)
Potassium: 3.9 mEq/L (ref 3.5–5.1)
Sodium: 133 mEq/L — ABNORMAL LOW (ref 135–145)

## 2013-02-08 LAB — BRAIN NATRIURETIC PEPTIDE: Pro B Natriuretic peptide (BNP): 387 pg/mL — ABNORMAL HIGH (ref 0.0–100.0)

## 2013-02-08 MED ORDER — BUMETANIDE 2 MG PO TABS: 2.0000 mg | ORAL_TABLET | Freq: Every day | ORAL | Status: DC

## 2013-02-08 MED ORDER — AMIODARONE HCL 200 MG PO TABS: 200.0000 mg | ORAL_TABLET | Freq: Every day | ORAL | Status: DC

## 2013-02-08 MED ORDER — WARFARIN SODIUM 2.5 MG PO TABS: 2.5000 mg | ORAL_TABLET | ORAL | Status: DC

## 2013-02-08 MED ORDER — PRAVASTATIN SODIUM 40 MG PO TABS: 40.0000 mg | ORAL_TABLET | Freq: Every day | ORAL | Status: DC

## 2013-02-08 NOTE — Progress Notes (Signed)
Subjective:    Patient ID: Caleb [name], male    DOB: May 02, 1940, 72 y.o.   MRN: [id number]  HPI  72 y.o.WM  02/08/2013 Chief Complaint  Patient presents with  . Follow-up    Still recovering from heart surgery.  Cough with small amount of white mucus.  This patient returns from [facility] after having an undergoing an aortic valve repair, mitral valve repair, left atrial appendage occlusion procedure and procedure to treat atrial fibrillation. This procedure was done on 01/09/2013. The patient's had several followups since discharge from the hospital November 3. The patient stayed in [place] for an additional 2 weeks after discharge and outpatient followup. All the records from this hospitalization are in the patient's Epic chart and have been brought forward and placed into the chart from the [facility] visits.   Pt arrived  Back 02/02/13.    Symptoms now: edema in ankle and legs, started after surgery.  Weight stable, 150-154.  Sl cough, sl mucus.  Got a stool softner dissolved throat.  No pndrip.  Not much pain in sternal area.  No oxygen needed.  No palpitations.  No dizziness.  On amiodarone.  No syncopal Sleep better in own home.  Needs rehab appt   [name]   FAx [phone]    [email or web address]  Phone  [phone]    Past Medical History  Diagnosis Date  . CAD (coronary artery disease)   . Atrial fibrillation   . Cardiomyopathy, ischemic   . Heart failure, systolic, acute on chronic   . Hypertension   . Hyperlipidemia   . Pleural effusion   . Positional vertigo   . Dizziness   . GERD (gastroesophageal reflux disease)   . Atypical pneumonia   . Cough   . Allergic rhinitis   . TIA (transient ischemic attack)   . OSA (obstructive sleep apnea)     Home sleep test 07/05/2009 AHI 8.2  . Chronic anticoagulation      Family History  Problem Relation Age of Onset  . Adopted: Yes     History   Social History  . Marital Status: Married    Spouse  Name: N/A    Number of Children: N/A  . Years of Education: N/A   Occupational History  . PHYSICIAN     Psychologist   Social History Main Topics  . Smoking status: Never Smoker   . Smokeless tobacco: Never Used  . Alcohol Use: Yes     Comment: Socially  . Drug Use: No  . Sexual Activity: Not on file   Other Topics Concern  . Not on file   Social History Narrative   Married   Gets regular exercise     Allergies  Allergen Reactions  . Carvedilol     Dizziness, uneasiness, alopecia  . Sulfonamide Derivatives     REACTION: rash  . Pradaxa [Dabigatran Etexilate Mesylate] Rash  . Xarelto [Rivaroxaban] Rash     Outpatient Prescriptions Prior to Visit  Medication Sig Dispense Refill  . nitroGLYCERIN (NITROSTAT) 0.4 MG SL tablet Place 0.4 mg under the tongue every 5 (five) minutes as needed. May repeat for up to 3 doses.       . Omega-3 Fatty Acids (THE VERY FINEST FISH OIL) LIQD Take 2-3 g by mouth daily.       . Potassium Chloride ER 20 MEQ TBCR Take 20 mEq by mouth 2 (two) times daily.       Marland Kitchen COUMADIN 5 MG tablet TAKE AS  DIRECTED.  40 tablet  3  . dofetilide (TIKOSYN) 125 MCG capsule Take 125 mcg by mouth 2 (two) times daily.      Marland Kitchen enoxaparin (LOVENOX) 80 MG/0.8ML injection Inject 0.8 mLs (80 mg total) into the skin every 12 (twelve) hours.  10 Syringe  0  . furosemide (LASIX) 20 MG tablet Take 20 mg by mouth as needed.      . metoprolol succinate (TOPROL-XL) 25 MG 24 hr tablet Take 1 tablet (25 mg total) by mouth 2 (two) times daily.  180 tablet  1  . RANEXA 500 MG 12 hr tablet Take 500 mg by mouth 2 (two) times daily.       . valsartan (DIOVAN) 40 MG tablet ON HOLD       No facility-administered medications prior to visit.    Review of Systems  Constitutional:   No  weight loss, night sweats,  Fevers, chills, fatigue, lassitude. HEENT:   No headaches,  Difficulty swallowing,  Tooth/dental problems,  Sore throat,                No sneezing, itching, ear ache, nasal  congestion, post nasal drip,   CV:  No chest pain,  Orthopnea, PND, swelling in lower extremities, anasarca, dizziness, palpitations  GI  No heartburn, indigestion, abdominal pain, nausea, vomiting, diarrhea, change in bowel habits, loss of appetite  Resp: ++ shortness of breath with exertion not at rest.  No excess mucus, no productive cough,  No non-productive cough,  No coughing up of blood.  No change in color of mucus.  No wheezing.  No chest wall deformity  Skin: no rash or lesions.  GU: no dysuria, change in color of urine, no urgency or frequency.  No flank pain.  MS:  No joint pain or swelling.  No decreased range of motion.  No back pain.  Psych:  No change in mood or affect. No depression or anxiety.  No memory loss.     Objective:   Physical Exam  Filed Vitals:   02/08/13 0932  BP: 130/68  Pulse: 83  Temp: 97.9 F (36.6 C)  TempSrc: Oral  Height: _0  (1.778 m)  Weight: 159 lb 9.6 oz (72.394 kg)  SpO2: 97%    Gen: Pleasant, well-nourished, in no distress,  normal affect  ENT: No lesions,  mouth clear,  oropharynx clear, no postnasal drip  Neck: No JVD, no TMG, no carotid bruits  Lungs: No use of accessory muscles, no dullness to percussion, clear without rales or rhonchi  Cardiovascular: reg reg   heart sounds normal, no murmur or gallops, 2+ peripheral edema  Abdomen: soft and NT, no HSM,  BS normal  Musculoskeletal: No deformities, no cyanosis or clubbing  Neuro: alert, non focal  Skin: Warm, no lesions or rashes  Sleep study reviewed: revealed AHI 80 mild desaturation, no RLSM.    All studies/ labs from [facility] reviewed and are in care everywhere section of Epic      Assessment & Plan:   CORONARY HEART DISEASE This patient is status post bypass surgery with internal mammary to left anterior descending artery, aortic valve repair, and the MAZE procedure, and also left atrial appendage exclusion procedure,  mitral valve ring repair.  These were all done at [facility] in [place] in 01/09/2013. This patient has spent the past month in [place] recovering from this procedure and has returned to [facility] for followup. Also note a left atrial appendage exclusion was performed during  that hospitalization. Overall patient is stable at this point and appears to be an adequate volume status. The patient wishes to reengage in the pulmonary clinic for followup of long-standing pleural effusions which appear to now be stable. The patient also is going to reestablish in the cardiology clinic as well. Note the patient is now on Coumadin therapy because of the atrial fibrillation.  Note the atrial fibrillation is now converted to sinus rhythm. This patient is on amiodarone at this time. He did have the MAZE  procedure with the goal of potentially recurrent evaluations  Plan Maintain current medications as prescribed Obtain baseline labs and chest x-ray   CARDIOMYOPATHY, ISCHEMIC Ischemic cardiomyopathy with ejection fraction of 32% at last echocardiogram status post bypass surgery to left anterior descending artery and aortic and mitral valve repairs  Plan Refer back to cardiology for further followup and treatment Maintain current cardiac medications as prescribed Note the patient has been taken off the ARB and beta blocker at this time her cardiologist in [place]  wear thigh high TED hose.  Needs cardiac rehab referral Needs cardiology f/u  Updated Medication List Outpatient Encounter Prescriptions as of 02/08/2013  Medication Sig  . amiodarone (PACERONE) 200 MG tablet Take 1 tablet (200 mg total) by mouth daily.  . Ascorbic Acid (VITAMIN C) 500 MG CAPS Take 2 capsules by mouth daily.  Marland Kitchen aspirin 81 MG tablet Take 81 mg by mouth daily.  . bumetanide (BUMEX) 2 MG tablet Take 1 tablet (2 mg total) by mouth daily.  Marland Kitchen docusate (COLACE) 50 MG/5ML liquid Take by mouth daily.  . Magnesium 100 MG CAPS Take 1-2  capsules by mouth daily.  . Magnesium Hydroxide (MILK OF MAGNESIA PO) Take by mouth as needed.  . nitroGLYCERIN (NITROSTAT) 0.4 MG SL tablet Place 0.4 mg under the tongue every 5 (five) minutes as needed. May repeat for up to 3 doses.   . Omega-3 Fatty Acids (THE VERY FINEST FISH OIL) LIQD Take 2-3 g by mouth daily.   . Potassium Chloride ER 20 MEQ TBCR Take 20 mEq by mouth 2 (two) times daily.   Marland Kitchen zinc sulfate (ORAZINC) 220 MG capsule Take 1 capsule by mouth daily.  . [DISCONTINUED] amiodarone (PACERONE) 200 MG tablet Take 1 tablet by mouth daily.  . [DISCONTINUED] bumetanide (BUMEX) 2 MG tablet Take 1 tablet by mouth daily.  . [DISCONTINUED] warfarin (COUMADIN) 5 MG tablet Take 5 mg by mouth as directed.  . pravastatin (PRAVACHOL) 40 MG tablet Take 1 tablet (40 mg total) by mouth daily. Please fill as BRAND ONLY.  Thank you.  . warfarin (COUMADIN) 2.5 MG tablet Take 1 tablet (2.5 mg total) by mouth as directed.  . [DISCONTINUED] COUMADIN 5 MG tablet TAKE AS DIRECTED.  . [DISCONTINUED] dofetilide (TIKOSYN) 125 MCG capsule Take 125 mcg by mouth 2 (two) times daily.  . [DISCONTINUED] enoxaparin (LOVENOX) 80 MG/0.8ML injection Inject 0.8 mLs (80 mg total) into the skin every 12 (twelve) hours.  . [DISCONTINUED] furosemide (LASIX) 20 MG tablet Take 20 mg by mouth as needed.  . [DISCONTINUED] metoprolol succinate (TOPROL-XL) 25 MG 24 hr tablet Take 1 tablet (25 mg total) by mouth 2 (two) times daily.  . [DISCONTINUED] metoprolol succinate (TOPROL-XL) 25 MG 24 hr tablet ON HOLD  . [DISCONTINUED] pravastatin (PRAVACHOL) 40 MG tablet ON HOLD  . [DISCONTINUED] RANEXA 500 MG 12 hr tablet Take 500 mg by mouth 2 (two) times daily.   . [DISCONTINUED] valsartan (DIOVAN) 40 MG tablet ON HOLD

## 2013-02-08 NOTE — Patient Instructions (Signed)
Labs today Chest xray today We will obtain an appointment with Dr Burt Knack No medication changes, Take Statin/pravastatin DAILY Make appointment with Cardiac rehab, we will establish Wear thigh high TED hose during the day, take off at night

## 2013-02-09 ENCOUNTER — Encounter: Payer: Self-pay | Admitting: Critical Care Medicine

## 2013-02-09 DIAGNOSIS — Z951 Presence of aortocoronary bypass graft: Secondary | ICD-10-CM | POA: Insufficient documentation

## 2013-02-09 DIAGNOSIS — Z9889 Other specified postprocedural states: Secondary | ICD-10-CM | POA: Insufficient documentation

## 2013-02-09 NOTE — Assessment & Plan Note (Signed)
Ischemic cardiomyopathy with ejection fraction of 32% at last echocardiogram status post bypass surgery to left anterior descending artery and aortic and mitral valve repairs  Plan Refer back to cardiology for further followup and treatment Maintain current cardiac medications as prescribed Note the patient has been taken off the ARB and beta blocker at this time her cardiologist in [place]

## 2013-02-09 NOTE — Assessment & Plan Note (Signed)
This patient is status post bypass surgery with internal mammary to left anterior descending artery, aortic valve repair, and the MAZE procedure, and also left atrial appendage exclusion procedure,  mitral valve ring repair. These were all done at [facility] in [place] in 01/09/2013. This patient has spent the past month in [place] recovering from this procedure and has returned to [facility] for followup. Also note a left atrial appendage exclusion was performed during that hospitalization. Overall patient is stable at this point and appears to be an adequate volume status. The patient wishes to reengage in the pulmonary clinic for followup of long-standing pleural effusions which appear to now be stable. The patient also is going to reestablish in the cardiology clinic as well. Note the patient is now on Coumadin therapy because of the atrial fibrillation.  Note the atrial fibrillation is now converted to sinus rhythm. This patient is on amiodarone at this time. He did have the MAZE  procedure with the goal of potentially recurrent evaluations  Plan Maintain current medications as prescribed Obtain baseline labs and chest x-ray

## 2013-02-10 ENCOUNTER — Telehealth: Payer: Self-pay | Admitting: *Deleted

## 2013-02-10 NOTE — Telephone Encounter (Signed)
Called [phone]   Pt ID# [id number]  pravachol 40 mg  1 daily---name brand only has been approved through 03/22/2014 for the pt.  i have called [facility] and they are aware. i have called pt and he is aware.

## 2013-02-13 ENCOUNTER — Encounter: Payer: Self-pay | Admitting: Physician Assistant

## 2013-02-13 ENCOUNTER — Ambulatory Visit (HOSPITAL_BASED_OUTPATIENT_CLINIC_OR_DEPARTMENT_OTHER)
Admission: RE | Admit: 2013-02-13 | Discharge: 2013-02-13 | Disposition: A | Discharge status: Home / Self Care | Payer: Medicare Other | Source: Ambulatory Visit | Attending: Critical Care Medicine | Admitting: Critical Care Medicine

## 2013-02-13 ENCOUNTER — Ambulatory Visit (INDEPENDENT_AMBULATORY_CARE_PROVIDER_SITE_OTHER): Payer: Medicare Other | Admitting: Physician Assistant

## 2013-02-13 ENCOUNTER — Telehealth: Payer: Self-pay | Admitting: Critical Care Medicine

## 2013-02-13 ENCOUNTER — Ambulatory Visit (INDEPENDENT_AMBULATORY_CARE_PROVIDER_SITE_OTHER): Payer: Medicare Other | Admitting: Critical Care Medicine

## 2013-02-13 ENCOUNTER — Encounter: Payer: Self-pay | Admitting: Critical Care Medicine

## 2013-02-13 VITALS — BP 142/70 | HR 82 | Temp 98.1°F | Ht 70.0 in | Wt 158.0 lb

## 2013-02-13 VITALS — BP 152/70 | HR 86 | Ht 70.0 in | Wt 157.8 lb

## 2013-02-13 DIAGNOSIS — I5022 Chronic systolic (congestive) heart failure: Secondary | ICD-10-CM | POA: Diagnosis not present

## 2013-02-13 DIAGNOSIS — Z951 Presence of aortocoronary bypass graft: Secondary | ICD-10-CM

## 2013-02-13 DIAGNOSIS — R05 Cough: Secondary | ICD-10-CM

## 2013-02-13 DIAGNOSIS — I10 Essential (primary) hypertension: Secondary | ICD-10-CM

## 2013-02-13 DIAGNOSIS — I2510 Atherosclerotic heart disease of native coronary artery without angina pectoris: Secondary | ICD-10-CM | POA: Diagnosis not present

## 2013-02-13 DIAGNOSIS — I4891 Unspecified atrial fibrillation: Secondary | ICD-10-CM

## 2013-02-13 DIAGNOSIS — Z9889 Other specified postprocedural states: Secondary | ICD-10-CM

## 2013-02-13 DIAGNOSIS — J90 Pleural effusion, not elsewhere classified: Secondary | ICD-10-CM | POA: Insufficient documentation

## 2013-02-13 DIAGNOSIS — I2589 Other forms of chronic ischemic heart disease: Secondary | ICD-10-CM

## 2013-02-13 DIAGNOSIS — R059 Cough, unspecified: Secondary | ICD-10-CM

## 2013-02-13 DIAGNOSIS — J9819 Other pulmonary collapse: Secondary | ICD-10-CM | POA: Diagnosis not present

## 2013-02-13 DIAGNOSIS — I38 Endocarditis, valve unspecified: Secondary | ICD-10-CM

## 2013-02-13 DIAGNOSIS — E785 Hyperlipidemia, unspecified: Secondary | ICD-10-CM

## 2013-02-13 MED ORDER — VALSARTAN 40 MG PO TABS: 40.0000 mg | ORAL_TABLET | Freq: Every day | ORAL | Status: DC

## 2013-02-13 MED ORDER — FUROSEMIDE 40 MG PO TABS: ORAL_TABLET | ORAL | Status: DC

## 2013-02-13 MED ORDER — FUROSEMIDE 40 MG PO TABS: 40.0000 mg | ORAL_TABLET | Freq: Every day | ORAL | Status: DC

## 2013-02-13 NOTE — Progress Notes (Signed)
Subjective:    Patient ID: Caleb [name], male    DOB: [birth year], 72 y.o.   MRN: [id number]  HPI  72 y.o.WM  02/08/2013 Chief Complaint  Patient presents with  . Follow-up    Still recovering from heart surgery.  Cough with small amount of white mucus.  This patient returns from [facility] after having an undergoing an aortic valve repair, mitral valve repair, left atrial appendage occlusion procedure and procedure to treat atrial fibrillation. This procedure was done on 01/09/2013. The patient's had several followups since discharge from the hospital November 3. The patient stayed in [place] for an additional 2 weeks after discharge and outpatient followup. All the records from this hospitalization are in the patient's Epic chart and have been brought forward and placed into the chart from the [facility] visits.   Pt arrived  Back 02/02/13.    Symptoms now: edema in ankle and legs, started after surgery.  Weight stable, 150-154.  Sl cough, sl mucus.  Got a stool softner dissolved throat.  No pndrip.  Not much pain in sternal area.  No oxygen needed.  No palpitations.  No dizziness.  On amiodarone.  No syncopal Sleep better in own home.  Needs rehab appt   Caleb [name]   FAx [phone]    [email or web address]  Phone  [phone]    02/13/2013 Chief Complaint  Patient presents with  . Acute Visit    with cxr.  Congestion, rattling, increased DOE, and cough - prod at times with small amount of white mucus x several days.    This patient is seen as an acute work in and was just in this office 5 days previous. The patient is concerned that he may have pneumonia. He was just seen by cardiology earlier during this day. Cardiology has increased the patient's Lasix and recommended starting valsartan in which the patient has not yet obtained. The patient notes increasing congestion and shortness of breath with exertion and lower extremity edema. The patient denies any chest pain.  There is no excess mucus. When he does cough but mucus is clear. There is no fever chills or sweats.  Past Medical History  Diagnosis Date  . CAD (coronary artery disease)   . Atrial fibrillation   . Cardiomyopathy, ischemic   . Heart failure, systolic, acute on chronic   . Hypertension   . Hyperlipidemia   . Pleural effusion   . Positional vertigo   . Dizziness   . GERD (gastroesophageal reflux disease)   . Atypical pneumonia   . Cough   . Allergic rhinitis   . TIA (transient ischemic attack)   . OSA (obstructive sleep apnea)     Home sleep test 07/05/2009 AHI 8.2  . Chronic anticoagulation      Family History  Problem Relation Age of Onset  . Adopted: Yes     History   Social History  . Marital Status: Married    Spouse Name: N/A    Number of Children: N/A  . Years of Education: N/A   Occupational History  . PHYSICIAN     Psychologist   Social History Main Topics  . Smoking status: Never Smoker   . Smokeless tobacco: Never Used  . Alcohol Use: Yes     Comment: Socially  . Drug Use: No  . Sexual Activity: Not on file   Other Topics Concern  . Not on file   Social History Narrative   Married   Gets regular exercise  Allergies  Allergen Reactions  . Carvedilol     Dizziness, uneasiness, alopecia  . Sulfonamide Derivatives     REACTION: rash  . Pradaxa [Dabigatran Etexilate Mesylate] Rash  . Xarelto [Rivaroxaban] Rash     Outpatient Prescriptions Prior to Visit  Medication Sig Dispense Refill  . amiodarone (PACERONE) 200 MG tablet Take 1 tablet (200 mg total) by mouth daily.  30 tablet  6  . Ascorbic Acid (VITAMIN C) 500 MG CAPS Take 2 capsules by mouth daily.      Marland Kitchen aspirin 81 MG tablet Take 81 mg by mouth daily.      Marland Kitchen docusate (COLACE) 50 MG/5ML liquid Take by mouth daily.      . Magnesium 100 MG CAPS Take 1-2 capsules by mouth daily.      . Magnesium Hydroxide (MILK OF MAGNESIA PO) Take by mouth as needed.      . nitroGLYCERIN (NITROSTAT)  0.4 MG SL tablet Place 0.4 mg under the tongue every 5 (five) minutes as needed. May repeat for up to 3 doses.       . Omega-3 Fatty Acids (THE VERY FINEST FISH OIL) LIQD Take 2-3 g by mouth daily.       . Potassium Chloride ER 20 MEQ TBCR Take 20 mEq by mouth 2 (two) times daily.       Marland Kitchen warfarin (COUMADIN) 2.5 MG tablet Take 1 tablet (2.5 mg total) by mouth as directed.  30 tablet  6  . zinc sulfate (ORAZINC) 220 MG capsule Take 1 capsule by mouth daily.      . pravastatin (PRAVACHOL) 40 MG tablet Take 1 tablet (40 mg total) by mouth daily. Please fill as BRAND ONLY.  Thank you.  30 tablet  6  . valsartan (DIOVAN) 40 MG tablet Take 1 tablet (40 mg total) by mouth daily.      . bumetanide (BUMEX) 2 MG tablet Take 1 tablet (2 mg total) by mouth daily.  30 tablet  6  . furosemide (LASIX) 40 MG tablet Take 1 tablet (40 mg total) by mouth daily.  30 tablet  11   No facility-administered medications prior to visit.    Review of Systems  Constitutional:   No  weight loss, night sweats,  Fevers, chills, fatigue, lassitude. HEENT:   No headaches,  Difficulty swallowing,  Tooth/dental problems,  Sore throat,                No sneezing, itching, ear ache, nasal congestion, post nasal drip,   CV:  No chest pain,  Orthopnea, PND, swelling in lower extremities, anasarca, dizziness, palpitations  GI  No heartburn, indigestion, abdominal pain, nausea, vomiting, diarrhea, change in bowel habits, loss of appetite  Resp: ++ shortness of breath with exertion not at rest.  No excess mucus, no productive cough,  No non-productive cough,  No coughing up of blood.  No change in color of mucus.  No wheezing.  No chest wall deformity  Skin: no rash or lesions.  GU: no dysuria, change in color of urine, no urgency or frequency.  No flank pain.  MS:  No joint pain or swelling.  No decreased range of motion.  No back pain.  Psych:  No change in mood or affect. No depression or anxiety.  No memory loss.      Objective:   Physical Exam  Filed Vitals:   02/13/13 1536  BP: 142/70  Pulse: 82  Temp: 98.1 F (36.7 C)  TempSrc: Oral  Height:  _0  (1.778 m)  Weight: 158 lb (71.668 kg)  SpO2: 99%    Gen: Pleasant, well-nourished, in no distress,  normal affect  ENT: No lesions,  mouth clear,  oropharynx clear, no postnasal drip  Neck: No JVD, no TMG, no carotid bruits  Lungs: No use of accessory muscles, no dullness to percussion, bibasilar rales  Cardiovascular: reg reg   heart sounds normal, no murmur or gallops, 3+ peripheral edema  Abdomen: soft and NT, no HSM,  BS normal  Musculoskeletal: No deformities, no cyanosis or clubbing  Neuro: alert, non focal  Skin: Warm, no lesions or rashes     Assessment & Plan:   Chronic systolic heart failure Chronic systolic heart failure due to ischemic cardiomyopathy with mild volume excess on exam No evidence of tracheobronchitis or pneumonia Plan Lasix 40 mg TWICE daily for 3 days then ONE daily Potassium twice daily Start Valsartan daily as ordered by Cardiology Keep established return with Dr Joya Gaskins at [facility]   wear thigh high TED hose.  Needs cardiac rehab referral Needs cardiology f/u  Updated Medication List Outpatient Encounter Prescriptions as of 02/13/2013  Medication Sig  . amiodarone (PACERONE) 200 MG tablet Take 1 tablet (200 mg total) by mouth daily.  . Ascorbic Acid (VITAMIN C) 500 MG CAPS Take 2 capsules by mouth daily.  Marland Kitchen aspirin 81 MG tablet Take 81 mg by mouth daily.  Marland Kitchen docusate (COLACE) 50 MG/5ML liquid Take by mouth daily.  . Magnesium 100 MG CAPS Take 1-2 capsules by mouth daily.  . Magnesium Hydroxide (MILK OF MAGNESIA PO) Take by mouth as needed.  . nitroGLYCERIN (NITROSTAT) 0.4 MG SL tablet Place 0.4 mg under the tongue every 5 (five) minutes as needed. May repeat for up to 3 doses.   . Omega-3 Fatty Acids (THE VERY FINEST FISH OIL) LIQD Take 2-3 g by mouth daily.   . Potassium Chloride ER 20 MEQ TBCR  Take 20 mEq by mouth 2 (two) times daily.   Marland Kitchen warfarin (COUMADIN) 2.5 MG tablet Take 1 tablet (2.5 mg total) by mouth as directed.  . zinc sulfate (ORAZINC) 220 MG capsule Take 1 capsule by mouth daily.  . furosemide (LASIX) 40 MG tablet Take one twice daily for 3 days then one daily  . pravastatin (PRAVACHOL) 40 MG tablet Take 1 tablet (40 mg total) by mouth daily. Please fill as BRAND ONLY.  Thank you.  . valsartan (DIOVAN) 40 MG tablet Take 1 tablet (40 mg total) by mouth daily.  . [DISCONTINUED] bumetanide (BUMEX) 2 MG tablet Take 1 tablet (2 mg total) by mouth daily.  . [DISCONTINUED] furosemide (LASIX) 40 MG tablet Take 1 tablet (40 mg total) by mouth daily.

## 2013-02-13 NOTE — Telephone Encounter (Signed)
Called, spoke with pt.  He will come to [facility] office today at 4 pm for OV with PW.  Pt will arrive early for cxr first.

## 2013-02-13 NOTE — Patient Instructions (Signed)
Your physician has recommended you make the following change in your medication: Stop taking Bumex, Start Lasix 40 MG daily (ok on first day to take 60 MG 1 1/2 tablet of 40 MG), Start Diovan 40 MG Daily.  Your physician recommends that you return for lab work in: One week on 02/20/13 for BMET,TSH,Lipid,and ALT Panels (These are fasting panels)  We are going to refer you over to Cardiac Rehab at [facility]  We sent a request to [facility] in [place] for your ECHO and D/C Summary from 12/2012  Your physician recommends that you schedule a follow-up appointment in: 4-6 Weeks with Dr Caryl Comes  Your physician recommends that you schedule a follow-up appointment in: 3-4 weeks with the coumadin clinic

## 2013-02-13 NOTE — Progress Notes (Signed)
[address], [place], [place]  [address] Phone: [phone] Fax:  [phone]  Date:  02/13/2013   ID:  Caleb [name], Caleb [name] 1940-11-02, MRN [id number]  PCP:  Joycelyn Man, MD  Cardiologist:  Dr. Remo Lipps Klein/Dr. Sherren Mocha   Electrophysiologist:  Dr. Virl Axe    History of Present Illness: Caleb [name] is a 72 y.o. male with a h/o parox. AFib, on Tikosyn and coumadin, CAD, s/p Ant MI in 2004 tx'd with stenting to the LAD, ICM with EF 45-80%, chronic systolic CHF, HTN, HL.   Myoview (04/2010): Anteroapical infarct, no ischemia, EF 38%.  Patient was evaluated at [facility] due to increased fatigue. Stress echocardiogram was not completed because of decreased EF now in the range of 20-25%.  R/L heart cath (08/2012):  RA A wave 3, V wave 1, mean 0, RV 51/3, PA 46/16 with mean 29, PCWP A wave 17, V wave 27, mean 16, CO2 4.7, CI 2.5; proximal LAD stent patent, 7580% after D1, mid AV groove circumflex 75-80%, proximal RCA 30-40%, EF 25-30%, 3+ MR.  TEE (09/2012): EF 25% with anterior, anteroseptal and apical akinesis, mild AI, mildly dilated aortic root measuring 4.3 cm, ascending aorta dilated at 4.2 cm, grade 3 descending thoracic aortic plaque, moderate MR, mild LAE, no right-to-left atrial level shunt.  Cardiac MRI (09/2012): EF 35%, probable moderate central MR, mildly dilated aortic root and ascending aorta, delayed enhancement pattern in the anterior and anteroseptal wall suggestive these walls are unlikely to improve significantly with revascularization.  He was evaluated by Dr. Burt Knack in 10/2012.  Medical therapy was recommended.  He went for a 2nd opinion at [facility] in [place], [place].  He was offered AV and MV repair, CABG and MAZE procedure.  He underwent aortic and mitral valve repair, aortic aneurysm repair by means of reduction plasty of aortic root and ascending aorta, bi-atrial maze, left atrial appendage exclusion and CABG x1 with a LIMA-LAD  on 01/09/13. The surgeon is Dr. Wendi Maya.   He remained in [place] for sometime after his surgery. He tells me that he had a left pleural effusion that was treated with thoracentesis.   D/c notes indicate he remained in AFib post op and was continued on Amiodarone.  He is to remain on this for 3 mos post op.  Post op Echo at [facility] (01/23/13):  EF 32 %, akinesis of the entire apical wall, akinesis of the apical cap, akinesis of the mid anteroseptal to septal wall, mitral annuloplasty ring present with mild MR, mild aortic root dilation (Sinus of Valsalva: 4.1 cm. Ascending Aorta 4.6 cm), mild to moderate AI, PASP 37 (mild pulmo HTN).    He has made slow and steady progress since his surgery. He continues to have significant problems with LE edema. He was changed from Lasix to Bumex. He has not had much relief with this. Most of his edema seems to be dependent. He is not wearing his compression stockings today. Breathing seems to be stable since his surgery. He noted some worsening dyspnea with exertion over the last couple of days. Overall, he describes NYHA class II symptoms. His chest remains sore. His Steri-Strips are still in place. He denies orthopnea or PND. He denies syncope.  He saw Dr. Joya Gaskins last week. Chest x-ray was obtained and demonstrated small bilateral effusions. He sees Dr. Joya Gaskins again today due to some upper airway congestion and he has another chest x-ray scheduled.  Recent Labs:  10/20/2012: ALT 12; Direct LDL 163.9; HDL 58.60; Hemoglobin 15.5; TSH 2.22  02/08/2013: Creatinine 1.0; Potassium 3.9; Pro B Natriuretic peptide (BNP) 387.0*   Wt Readings from Last 3 Encounters:  02/13/13 157 lb 12.8 oz (71.578 kg)  02/08/13 159 lb 9.6 oz (72.394 kg)  11/25/12 159 lb 9.6 oz (72.394 kg)     Past Medical History  Diagnosis Date  . CAD (coronary artery disease)   . Atrial fibrillation   . Cardiomyopathy, ischemic   . Heart failure, systolic, acute on chronic   .  Hypertension   . Hyperlipidemia   . Pleural effusion   . Positional vertigo   . Dizziness   . GERD (gastroesophageal reflux disease)   . Atypical pneumonia   . Cough   . Allergic rhinitis   . TIA (transient ischemic attack)   . OSA (obstructive sleep apnea)     Home sleep test 07/05/2009 AHI 8.2  . Chronic anticoagulation     Current Outpatient Prescriptions  Medication Sig Dispense Refill  . amiodarone (PACERONE) 200 MG tablet Take 1 tablet (200 mg total) by mouth daily.  30 tablet  6  . Ascorbic Acid (VITAMIN C) 500 MG CAPS Take 2 capsules by mouth daily.      Marland Kitchen aspirin 81 MG tablet Take 81 mg by mouth daily.      . bumetanide (BUMEX) 2 MG tablet Take 1 tablet (2 mg total) by mouth daily.  30 tablet  6  . docusate (COLACE) 50 MG/5ML liquid Take by mouth daily.      . Magnesium 100 MG CAPS Take 1-2 capsules by mouth daily.      . Magnesium Hydroxide (MILK OF MAGNESIA PO) Take by mouth as needed.      . nitroGLYCERIN (NITROSTAT) 0.4 MG SL tablet Place 0.4 mg under the tongue every 5 (five) minutes as needed. May repeat for up to 3 doses.       . Omega-3 Fatty Acids (THE VERY FINEST FISH OIL) LIQD Take 2-3 g by mouth daily.       . Potassium Chloride ER 20 MEQ TBCR Take 20 mEq by mouth 2 (two) times daily.       . pravastatin (PRAVACHOL) 40 MG tablet Take 1 tablet (40 mg total) by mouth daily. Please fill as BRAND ONLY.  Thank you.  30 tablet  6  . warfarin (COUMADIN) 2.5 MG tablet Take 1 tablet (2.5 mg total) by mouth as directed.  30 tablet  6  . zinc sulfate (ORAZINC) 220 MG capsule Take 1 capsule by mouth daily.      . [DISCONTINUED] eplerenone (INSPRA) 25 MG tablet Take 1 tablet (25 mg total) by mouth daily.  30 tablet  11   No current facility-administered medications for this visit.    Allergies:   Carvedilol; Sulfonamide derivatives; Pradaxa; and Xarelto   Social History:  The patient  reports that he has never smoked. He has never used smokeless tobacco. He reports that  he drinks alcohol. He reports that he does not use illicit drugs.   Family History:  The patient's family history is not on file. He was adopted.   ROS:  Please see the history of present illness.    All other systems reviewed and negative.   PHYSICAL EXAM: VS:  BP 152/70  Pulse 86  Ht _0  (1.778 m)  Wt 157 lb 12.8 oz (71.578 kg)  BMI 22.64 kg/m2 Well nourished, well developed, in no acute distress HEENT: normal Neck:  no JVD Cardiac:  normal S1, S2; RRR; no murmur Chest: Median sternotomy well healed; Steri-Strips remain in place Lungs:  clear to auscultation bilaterally, no wheezing, rhonchi or rales; no egophony Abd: soft, nontender, no hepatomegaly Ext: 2+ bilateral LE edema Skin: warm and dry Neuro:  CNs 2-12 intact, no focal abnormalities noted  EKG:  NSR, HR 86, interventricular conduction delay, inferolateral T wave inversions     ASSESSMENT AND PLAN:  1. CAD: No angina. He is status post recent single vessel CABG at [facility] in [place]. Continue aspirin and statin. He has not started a statin yet but plans to do so. Refer for cardiac rehabilitation.  I have encouraged him to go ahead peel-off the Steri-Strips as he is able. 2. Ischemic Cardiomyopathy: He is off of his ARB and beta blocker. He has had difficulty tolerating beta blockers in the past. He is currently on amiodarone. I am not certain that he could tolerate his beta blocker again at this time. I will however place him back on his ARB. Start Diovan 40 mg daily. Check a basic metabolic panel in one week. Consider adding beta blocker in followup. 3. Atrial fibrillation: He is status post recent Maze procedure. He is maintaining sinus rhythm. He remains on Coumadin. He tells me that he is supposed to remain on amiodarone for a total of 3 months postop. Check LFTs and TSH. 4. Valvular Heart Disease: He is status post recent aortic valve and mitral valve repair. Postop echo is available via Care  Everywhere. Aortic and mitral valve regurgitation is improved and remains stable. 5. Hyperlipidemia: He would like a baseline lipid panel. This will be obtained. He will start his pravastatin. 6. Hypertension:  Restart Diovan 40 mg QD as noted.  Check BMET in 1 week. 7. Chronic Systolic CHF: He has increased pedal edema. I believe that most of this is related to venous insufficiency. He has not had much success with Bumex. I have recommended that he discontinue Bumex. Start Lasix 60 mg x1 then 40 mg daily. Check a basic metabolic panel in one week. I have asked him to wear his compression stockings at all times and to keep his legs elevated. 8. Disposition: I will make sure he has f/u in the Coumadin clinic. I will also arrange follow up with [name] in 4-6 weeks.  Signed, Richardson Dopp, PA-C  02/13/2013 11:47 AM

## 2013-02-13 NOTE — Telephone Encounter (Signed)
Pt ill, cough, congestion. Needs to be worked in [employer] schedule, needs CXR first, I ordered this.  Does not know how to get to [facility]

## 2013-02-13 NOTE — Patient Instructions (Signed)
You do not have pneumonia Lasix 40 mg TWICE daily for 3 days then ONE daily Potassium twice daily Start Valsartan daily as ordered by Cardiology Keep established return with Dr Joya Gaskins at [facility]

## 2013-02-14 ENCOUNTER — Telehealth: Payer: Self-pay | Admitting: *Deleted

## 2013-02-14 NOTE — Telephone Encounter (Signed)
No recommendations ,observe for now Take lasix as prescribed

## 2013-02-14 NOTE — Progress Notes (Signed)
Quick Note:  Called, spoke with pt. Informed him of lab results and recs per Dr. Joya Gaskins. He verbalized understanding. ______

## 2013-02-14 NOTE — Assessment & Plan Note (Signed)
Chronic systolic heart failure due to ischemic cardiomyopathy with mild volume excess on exam No evidence of tracheobronchitis or pneumonia Plan Lasix 40 mg TWICE daily for 3 days then ONE daily Potassium twice daily Start Valsartan daily as ordered by Cardiology Keep established return with Dr Joya Gaskins at [facility]

## 2013-02-14 NOTE — Telephone Encounter (Signed)
Called, spoke with pt to advise of lab results.  Pt states last night he took the first 40 mg dose of lasix.  Approx 1 hour later, he noticed chills and felt warm and feverish.  States this lasted quite a while.  He did not have a fever.  Pt would like to know if PW has any further insight on this?  Also, pt states his feet and ankles look "considerably" better this morning.  He would like to know if PW would still like him to cont with the 40 mg lasix bid x 3 days now given this.  PW, pls advise.  Thank you.

## 2013-02-14 NOTE — Progress Notes (Signed)
Quick Note:  Call pt and tell his Magnesium RBC labs were NORMAL No change in medications ______

## 2013-02-14 NOTE — Telephone Encounter (Signed)
Pt aware and nothing further needed

## 2013-02-15 ENCOUNTER — Telehealth: Payer: Self-pay | Admitting: Physician Assistant

## 2013-02-15 NOTE — Telephone Encounter (Signed)
ROI faxed to [facility] @ [phone]

## 2013-02-20 ENCOUNTER — Telehealth: Payer: Self-pay | Admitting: Physician Assistant

## 2013-02-20 NOTE — Telephone Encounter (Signed)
Records Rec From [facility], Will Hold Till Arbie Cookey back on Tuesday  02/20/13/KM

## 2013-02-21 ENCOUNTER — Other Ambulatory Visit (INDEPENDENT_AMBULATORY_CARE_PROVIDER_SITE_OTHER): Payer: Medicare Other

## 2013-02-21 DIAGNOSIS — E785 Hyperlipidemia, unspecified: Secondary | ICD-10-CM

## 2013-02-21 DIAGNOSIS — I10 Essential (primary) hypertension: Secondary | ICD-10-CM

## 2013-02-21 DIAGNOSIS — I5022 Chronic systolic (congestive) heart failure: Secondary | ICD-10-CM | POA: Diagnosis not present

## 2013-02-21 DIAGNOSIS — I2589 Other forms of chronic ischemic heart disease: Secondary | ICD-10-CM

## 2013-02-21 LAB — LIPID PANEL
Cholesterol: 205 mg/dL — ABNORMAL HIGH (ref 0–200)
HDL: 54.5 mg/dL (ref >39.00)
Total CHOL/HDL Ratio: 4
Triglycerides: 41 mg/dL (ref 0.0–149.0)

## 2013-02-21 LAB — BASIC METABOLIC PANEL
BUN: 15 mg/dL (ref 6–23)
Calcium: 9 mg/dL (ref 8.4–10.5)
GFR: 83.31 mL/min (ref >60.00)
Potassium: 4.2 mEq/L (ref 3.5–5.1)
Sodium: 135 mEq/L (ref 135–145)

## 2013-02-21 LAB — TSH: TSH: 4.75 u[IU]/mL (ref 0.35–5.50)

## 2013-02-21 LAB — LDL CHOLESTEROL, DIRECT: Direct LDL: 143.9 mg/dL

## 2013-02-21 LAB — ALT: ALT: 16 U/L (ref 0–53)

## 2013-02-22 ENCOUNTER — Telehealth: Payer: Self-pay | Admitting: *Deleted

## 2013-02-22 DIAGNOSIS — E785 Hyperlipidemia, unspecified: Secondary | ICD-10-CM

## 2013-02-22 NOTE — Telephone Encounter (Signed)
pt notified about lab results and states he will cb to schedule for FLP/LFT to be on in 2-3 months. I will place the order today though.

## 2013-02-23 ENCOUNTER — Encounter (HOSPITAL_COMMUNITY)
Admission: RE | Admit: 2013-02-23 | Discharge: 2013-02-23 | Disposition: A | Discharge status: Home / Self Care | Payer: Medicare Other | Source: Ambulatory Visit | Attending: Internal Medicine | Admitting: Internal Medicine

## 2013-02-23 DIAGNOSIS — I2510 Atherosclerotic heart disease of native coronary artery without angina pectoris: Secondary | ICD-10-CM | POA: Insufficient documentation

## 2013-02-23 DIAGNOSIS — I428 Other cardiomyopathies: Secondary | ICD-10-CM | POA: Insufficient documentation

## 2013-02-23 DIAGNOSIS — Z951 Presence of aortocoronary bypass graft: Secondary | ICD-10-CM | POA: Insufficient documentation

## 2013-02-23 DIAGNOSIS — Z79899 Other long term (current) drug therapy: Secondary | ICD-10-CM | POA: Insufficient documentation

## 2013-02-23 DIAGNOSIS — I059 Rheumatic mitral valve disease, unspecified: Secondary | ICD-10-CM | POA: Insufficient documentation

## 2013-02-23 DIAGNOSIS — I509 Heart failure, unspecified: Secondary | ICD-10-CM | POA: Insufficient documentation

## 2013-02-23 NOTE — Progress Notes (Signed)
Cardiac Rehab Medication Review by a Pharmacist  Does the patient  feel that his/her medications are working for him/her?  yes  Has the patient been experiencing any side effects to the medications prescribed?  no  Does the patient measure his/her own blood pressure or blood glucose at home?  yes (blood pressure; no history of diabetes that would require home blood glucose checks)  Does the patient have any problems obtaining medications due to transportation or finances?   no  Understanding of regimen: excellent Understanding of indications: excellent Potential of compliance: excellent  Pharmacist comments: Caleb [name] demonstrates a good understanding of his medications, their indications, and how to take them. He is well educated on his medications and health conditions. Caleb [name] describes excellent adherence and says he is "pretty diligent" with his medications. His wife assists him in ensuring all of his medications are properly taken.   Meggan Dhaliwal C. Eveny Anastas, PharmD Clinical Pharmacist-Resident Pager: [phone] Pharmacy: [phone] 02/23/2013 8:55 AM

## 2013-02-25 ENCOUNTER — Other Ambulatory Visit: Payer: Self-pay | Admitting: Internal Medicine

## 2013-02-27 ENCOUNTER — Other Ambulatory Visit: Payer: Self-pay

## 2013-02-27 ENCOUNTER — Encounter (HOSPITAL_COMMUNITY)
Admission: RE | Admit: 2013-02-27 | Discharge: 2013-02-27 | Disposition: A | Discharge status: Home / Self Care | Payer: Medicare Other | Source: Ambulatory Visit | Attending: Internal Medicine | Admitting: Internal Medicine

## 2013-02-27 DIAGNOSIS — Z951 Presence of aortocoronary bypass graft: Secondary | ICD-10-CM | POA: Diagnosis not present

## 2013-02-27 DIAGNOSIS — I428 Other cardiomyopathies: Secondary | ICD-10-CM | POA: Diagnosis not present

## 2013-02-27 DIAGNOSIS — I509 Heart failure, unspecified: Secondary | ICD-10-CM | POA: Diagnosis not present

## 2013-02-27 DIAGNOSIS — Z79899 Other long term (current) drug therapy: Secondary | ICD-10-CM | POA: Diagnosis not present

## 2013-02-27 DIAGNOSIS — I059 Rheumatic mitral valve disease, unspecified: Secondary | ICD-10-CM | POA: Diagnosis not present

## 2013-02-27 DIAGNOSIS — I2510 Atherosclerotic heart disease of native coronary artery without angina pectoris: Secondary | ICD-10-CM | POA: Diagnosis not present

## 2013-02-27 MED ORDER — VALSARTAN 40 MG PO TABS: 40.0000 mg | ORAL_TABLET | Freq: Every day | ORAL | Status: DC

## 2013-02-27 NOTE — Progress Notes (Signed)
Pt started cardiac rehab today.  Pt tolerated light exercise without difficulty. Telemetry rhythm Sinus. Vital signs stable. Will continue to monitor the patient throughout  the program.

## 2013-03-01 ENCOUNTER — Encounter (HOSPITAL_COMMUNITY)
Admission: RE | Admit: 2013-03-01 | Discharge: 2013-03-01 | Disposition: A | Discharge status: Home / Self Care | Payer: Medicare Other | Source: Ambulatory Visit | Attending: Internal Medicine | Admitting: Internal Medicine

## 2013-03-03 ENCOUNTER — Encounter (HOSPITAL_COMMUNITY)
Admission: RE | Admit: 2013-03-03 | Discharge: 2013-03-03 | Disposition: A | Discharge status: Home / Self Care | Payer: Medicare Other | Source: Ambulatory Visit | Attending: Internal Medicine | Admitting: Internal Medicine

## 2013-03-06 ENCOUNTER — Encounter (HOSPITAL_COMMUNITY)
Admission: RE | Admit: 2013-03-06 | Discharge: 2013-03-06 | Disposition: A | Discharge status: Home / Self Care | Payer: Medicare Other | Source: Ambulatory Visit | Attending: Internal Medicine | Admitting: Internal Medicine

## 2013-03-06 NOTE — Progress Notes (Signed)
Reviewed home exercise with pt today.  Pt plans to walk at home and go to [facility] for exercise.  Reviewed THR, pulse, RPE, sign and symptoms, and when to call 911 or MD.  Pt voiced understanding. Alberteen Sam, MA, ACSM RCEP

## 2013-03-07 ENCOUNTER — Ambulatory Visit (INDEPENDENT_AMBULATORY_CARE_PROVIDER_SITE_OTHER): Payer: Medicare Other

## 2013-03-07 DIAGNOSIS — Z5181 Encounter for therapeutic drug level monitoring: Secondary | ICD-10-CM

## 2013-03-07 DIAGNOSIS — I4891 Unspecified atrial fibrillation: Secondary | ICD-10-CM

## 2013-03-07 DIAGNOSIS — Z7901 Long term (current) use of anticoagulants: Secondary | ICD-10-CM | POA: Diagnosis not present

## 2013-03-07 LAB — POCT INR: INR: 1.3

## 2013-03-08 ENCOUNTER — Encounter (HOSPITAL_COMMUNITY)
Admission: RE | Admit: 2013-03-08 | Discharge: 2013-03-08 | Disposition: A | Discharge status: Home / Self Care | Payer: Medicare Other | Source: Ambulatory Visit | Attending: Internal Medicine | Admitting: Internal Medicine

## 2013-03-10 ENCOUNTER — Encounter (HOSPITAL_COMMUNITY)
Admission: RE | Admit: 2013-03-10 | Discharge: 2013-03-10 | Disposition: A | Discharge status: Home / Self Care | Payer: Medicare Other | Source: Ambulatory Visit | Attending: Internal Medicine | Admitting: Internal Medicine

## 2013-03-13 ENCOUNTER — Encounter (HOSPITAL_COMMUNITY)
Admission: RE | Admit: 2013-03-13 | Discharge: 2013-03-13 | Disposition: A | Discharge status: Home / Self Care | Payer: Medicare Other | Source: Ambulatory Visit | Attending: Internal Medicine | Admitting: Internal Medicine

## 2013-03-14 ENCOUNTER — Ambulatory Visit (INDEPENDENT_AMBULATORY_CARE_PROVIDER_SITE_OTHER): Payer: Medicare Other | Admitting: Pharmacist

## 2013-03-14 DIAGNOSIS — Z7901 Long term (current) use of anticoagulants: Secondary | ICD-10-CM | POA: Diagnosis not present

## 2013-03-14 DIAGNOSIS — I4891 Unspecified atrial fibrillation: Secondary | ICD-10-CM | POA: Diagnosis not present

## 2013-03-14 LAB — POCT INR: INR: 1.9

## 2013-03-15 ENCOUNTER — Telehealth: Payer: Self-pay | Admitting: Internal Medicine

## 2013-03-15 ENCOUNTER — Encounter (HOSPITAL_COMMUNITY)
Admission: RE | Admit: 2013-03-15 | Discharge: 2013-03-15 | Disposition: A | Discharge status: Home / Self Care | Payer: Medicare Other | Source: Ambulatory Visit | Attending: Internal Medicine | Admitting: Internal Medicine

## 2013-03-15 NOTE — Progress Notes (Signed)
Pt in today for 1115 cardiac rehab exercise class.  Pt remarked that he felt shortness of breath and leg fatigue which had progressively gotten worse each day.  Pt earlier had contacted [name] office regarding shortness of breath.  Pt informed that [name] would be out of the office this week.  Offered to make appt for pt on Friday when the office reopens.  Pt declined and preferred to specifically talk with [name] about the shortness of breath.  Pt is compliant with his medications including lasix 77m daily.  Lungs sounds clear bilat, no change in weight, o2 sat maintained 98-99% on room air.  Pt noted that the bike was the hardest of the three and caused him the most fatigue in his leg which is different than when he exercised last week.  Offered to contact primary MD for follow up.  Pt politely declined and feels this is cardiac in nature and would best be dicussed with Dr. KCaryl Comes  Pt feels there may something going on with the graft.  Pt plans to re contact MD office on Friday.  Pt aware if his condition worsens he should immediately seek attention.  Will send rehab report for review

## 2013-03-15 NOTE — Telephone Encounter (Signed)
New Problem:  Pt states he is having SOB. Pt denies any other symptoms. Pt would like to speak to a nurse

## 2013-03-15 NOTE — Telephone Encounter (Signed)
Pt c/o of SOB when on bicycle at rehab or walking stairs. He states that he is not in any immediate danger and just wanted to discuss it with [name] if he was in the office. After finding out he is not in office until first of the year, pt states that he feels he is ok and will call if symptoms worsen. He has f/u appt on 1/7 with Caryl Comes.

## 2013-03-17 ENCOUNTER — Other Ambulatory Visit: Payer: Self-pay | Admitting: *Deleted

## 2013-03-17 ENCOUNTER — Encounter (HOSPITAL_COMMUNITY): Payer: Medicare Other

## 2013-03-17 NOTE — Telephone Encounter (Signed)
Follow Up  Pt called. He states that it is nothing serious simply requests a call back to discuss his symptoms further. Please call.

## 2013-03-17 NOTE — Telephone Encounter (Signed)
No new symptoms, still SOB and leg fatigue. Pt calling back to see if [name] available before his scheduled appt on 01/07. I explained that he is not available but that we could have him see a PA/NP - pt does not want to see them, only [name], states he will wait. Pt advised to call if symptoms worsen.

## 2013-03-20 ENCOUNTER — Encounter (HOSPITAL_COMMUNITY)
Admission: RE | Admit: 2013-03-20 | Discharge: 2013-03-20 | Disposition: A | Discharge status: Home / Self Care | Payer: Medicare Other | Source: Ambulatory Visit | Attending: Internal Medicine | Admitting: Internal Medicine

## 2013-03-22 ENCOUNTER — Encounter (HOSPITAL_COMMUNITY)
Admission: RE | Admit: 2013-03-22 | Discharge: 2013-03-22 | Disposition: A | Discharge status: Home / Self Care | Payer: Medicare Other | Source: Ambulatory Visit | Attending: Internal Medicine | Admitting: Internal Medicine

## 2013-03-24 ENCOUNTER — Encounter (HOSPITAL_COMMUNITY)
Admission: RE | Admit: 2013-03-24 | Discharge: 2013-03-24 | Disposition: A | Discharge status: Home / Self Care | Payer: Medicare Other | Source: Ambulatory Visit | Attending: Internal Medicine | Admitting: Internal Medicine

## 2013-03-24 DIAGNOSIS — I428 Other cardiomyopathies: Secondary | ICD-10-CM | POA: Diagnosis not present

## 2013-03-24 DIAGNOSIS — Z79899 Other long term (current) drug therapy: Secondary | ICD-10-CM | POA: Insufficient documentation

## 2013-03-24 DIAGNOSIS — I2510 Atherosclerotic heart disease of native coronary artery without angina pectoris: Secondary | ICD-10-CM | POA: Diagnosis not present

## 2013-03-24 DIAGNOSIS — I509 Heart failure, unspecified: Secondary | ICD-10-CM | POA: Diagnosis not present

## 2013-03-24 DIAGNOSIS — Z951 Presence of aortocoronary bypass graft: Secondary | ICD-10-CM | POA: Diagnosis not present

## 2013-03-24 DIAGNOSIS — I059 Rheumatic mitral valve disease, unspecified: Secondary | ICD-10-CM | POA: Insufficient documentation

## 2013-03-24 NOTE — Progress Notes (Signed)
Quality of life questionnaire forwarded to Dr Olin Pia office for review via fax.

## 2013-03-27 ENCOUNTER — Encounter (HOSPITAL_COMMUNITY)
Admission: RE | Admit: 2013-03-27 | Discharge: 2013-03-27 | Disposition: A | Discharge status: Home / Self Care | Payer: Medicare Other | Source: Ambulatory Visit | Attending: Internal Medicine | Admitting: Internal Medicine

## 2013-03-27 NOTE — Progress Notes (Signed)
Caleb [name] 73 y.o. male Nutrition Note Spoke with pt. Nutrition Plan and Nutrition Survey goals reviewed with pt. Pt is following Step 2 of the Therapeutic Lifestyle Changes diet. Pt taking Coumadin and is aware of drug-nutrient interaction. Pt expressed understanding of the information reviewed. Pt aware of nutrition education classes offered.  Nutrition Diagnosis   Food-and nutrition-related knowledge deficit related to lack of exposure to information as related to diagnosis of: ? CVD   Nutrition RX/ Estimated Daily Nutrition Needs for: wt maintenance 2000-2300 Kcal, 65-75 gm fat, 13-15 gm sat fat, 2.0-2.3 gm trans-fat, <1500 mg sodium  Nutrition Intervention   Pt's individual nutrition plan reviewed with pt.   Benefits of adopting Therapeutic Lifestyle Changes discussed when Medficts reviewed.   Pt to attend the Portion Distortion class - met 03/22/13   Pt given handouts for: ? Nutrition I class ? Nutrition II class    Continue client-centered nutrition education by RD, as part of interdisciplinary care. Goal(s)   Pt to describe the benefit of including fruits, vegetables, whole grains, and low-fat dairy products in a heart healthy meal plan. Monitor and Evaluate progress toward nutrition goal with team. Nutrition Risk: Change to Moderate Derek Mound, M.Ed, RD, LDN, CDE 03/27/2013 3:37 PM

## 2013-03-29 ENCOUNTER — Ambulatory Visit (INDEPENDENT_AMBULATORY_CARE_PROVIDER_SITE_OTHER): Payer: Medicare Other | Admitting: *Deleted

## 2013-03-29 ENCOUNTER — Ambulatory Visit (INDEPENDENT_AMBULATORY_CARE_PROVIDER_SITE_OTHER): Payer: Medicare Other | Admitting: Internal Medicine

## 2013-03-29 ENCOUNTER — Encounter: Payer: Self-pay | Admitting: Internal Medicine

## 2013-03-29 ENCOUNTER — Encounter (INDEPENDENT_AMBULATORY_CARE_PROVIDER_SITE_OTHER): Payer: Self-pay

## 2013-03-29 ENCOUNTER — Encounter (HOSPITAL_COMMUNITY)
Admission: RE | Admit: 2013-03-29 | Discharge: 2013-03-29 | Disposition: A | Discharge status: Home / Self Care | Payer: Medicare Other | Source: Ambulatory Visit | Attending: Internal Medicine | Admitting: Internal Medicine

## 2013-03-29 VITALS — BP 153/82 | HR 82 | Ht 70.0 in | Wt 156.0 lb

## 2013-03-29 DIAGNOSIS — R5383 Other fatigue: Secondary | ICD-10-CM

## 2013-03-29 DIAGNOSIS — I2589 Other forms of chronic ischemic heart disease: Secondary | ICD-10-CM | POA: Diagnosis not present

## 2013-03-29 DIAGNOSIS — I4891 Unspecified atrial fibrillation: Secondary | ICD-10-CM

## 2013-03-29 DIAGNOSIS — I495 Sick sinus syndrome: Secondary | ICD-10-CM

## 2013-03-29 DIAGNOSIS — Z7901 Long term (current) use of anticoagulants: Secondary | ICD-10-CM | POA: Diagnosis not present

## 2013-03-29 DIAGNOSIS — R0602 Shortness of breath: Secondary | ICD-10-CM

## 2013-03-29 DIAGNOSIS — I5022 Chronic systolic (congestive) heart failure: Secondary | ICD-10-CM

## 2013-03-29 DIAGNOSIS — R5381 Other malaise: Secondary | ICD-10-CM

## 2013-03-29 LAB — POCT INR: INR: 2.1

## 2013-03-29 MED ORDER — FUROSEMIDE 40 MG PO TABS: 80.0000 mg | ORAL_TABLET | Freq: Every day | ORAL | Status: DC

## 2013-03-29 NOTE — Progress Notes (Signed)
Patient Care Team: Dorena Cookey, MD as PCP - General Elsie Stain, MD (Pulmonary Disease)   HPI  Caleb [name] is a 73 y.o. male Seen in followup for atrial fib in the context of MR  AI with AV root dilitation,  non viability of LV by MR assoc with prior MI and EF 35%  He subsequently went to [facility] in [place] and he underwent aortic and mitral valve repair, aortic aneurysm repair by means of reduction plasty of aortic root and ascending aorta, bi-atrial maze, left atrial appendage exclusion and CABG x1 with a LIMA-LAD on 01/09/13 Atrial fib persisted and amiodarone was maintained.  Post op course cx by pleural effusion Echo post op EF 32% aortic root dilitation He was seen for postoperative shortness of breath cardiology as well as pulmonary with the feeling that this was related to congestive heart failure  He feels he is less well  Than 3-4 qweeks ago with more swelling and fatigue.  The plan had been to take amio for 3 months; HR recorded from rehab >>2 values greater than 100 but all the rest in the 80-90 raising question of chronotropic incompetence.  Some cough raises question of amio toxicity.  His wife thinks he is doing better than he thinks that he is doing  No afib post procedure of which he is aware    Past Medical History  Diagnosis Date  . CAD (coronary artery disease)   . Atrial fibrillation   . Cardiomyopathy, ischemic   . Heart failure, systolic, acute on chronic   . Hypertension   . Hyperlipidemia   . Pleural effusion   . Positional vertigo   . Dizziness   . GERD (gastroesophageal reflux disease)   . Atypical pneumonia   . Cough   . Allergic rhinitis   . TIA (transient ischemic attack)   . OSA (obstructive sleep apnea)     Home sleep test 07/05/2009 AHI 8.2  . Chronic anticoagulation     Past Surgical History  Procedure Laterality Date  . Coronary stent placement  2004    LAD  . Shoulder surgery    . Tee without cardioversion N/A  09/21/2012    Procedure: TRANSESOPHAGEAL ECHOCARDIOGRAM (TEE);  Surgeon: Larey Dresser, MD;  Location: [facility] ENDOSCOPY;  Service: Cardiovascular;  Laterality: N/A;  . Coronary artery bypass graft  01/09/13    LAD LIMA, left atrial appendage  . Mitral valve annuloplasty  01/09/13  . Aortic valve repair  01/09/13    Current Outpatient Prescriptions  Medication Sig Dispense Refill  . amiodarone (PACERONE) 200 MG tablet Take 1 tablet (200 mg total) by mouth daily.  30 tablet  6  . Ascorbic Acid (VITAMIN C) 500 MG CAPS Take 2 capsules by mouth daily.      Marland Kitchen aspirin 81 MG tablet Take 81 mg by mouth daily.      . furosemide (LASIX) 40 MG tablet Take one twice daily for 3 days then one daily  60 tablet  11  . Magnesium 100 MG CAPS Take 1 capsule by mouth 2 (two) times daily.       . Magnesium Hydroxide (MILK OF MAGNESIA PO) Take by mouth as needed.      . nitroGLYCERIN (NITROSTAT) 0.4 MG SL tablet Place 0.4 mg under the tongue every 5 (five) minutes as needed. May repeat for up to 3 doses.       . Omega-3 Fatty Acids (THE VERY FINEST FISH OIL) LIQD  Take 2-3 g by mouth daily.       . Potassium Chloride ER 20 MEQ TBCR Take 20 mEq by mouth 2 (two) times daily.       . pravastatin (PRAVACHOL) 40 MG tablet Take 1 tablet (40 mg total) by mouth daily. Please fill as BRAND ONLY.  Thank you.  30 tablet  6  . valsartan (DIOVAN) 40 MG tablet Take 1 tablet (40 mg total) by mouth daily.  30 tablet  1  . warfarin (COUMADIN) 2.5 MG tablet Take 1 tablet (2.5 mg total) by mouth as directed.  30 tablet  6  . Zinc 100 MG TABS Take by mouth daily.      . [DISCONTINUED] eplerenone (INSPRA) 25 MG tablet Take 1 tablet (25 mg total) by mouth daily.  30 tablet  11   No current facility-administered medications for this visit.    Allergies  Allergen Reactions  . Carvedilol     Dizziness, uneasiness, alopecia  . Sulfonamide Derivatives     REACTION: rash  . Pradaxa [Dabigatran Etexilate Mesylate] Rash  . Xarelto  [Rivaroxaban] Rash    Review of Systems negative except from HPI and PMH  Physical Exam BP 153/82  Pulse 82  Ht _0  (1.778 m)  Wt 156 lb (70.761 kg)  BMI 22.38 kg/m2 Well developed and well nourished in no acute distress HENT normal E scleral and icterus clear Neck Supple JVP flat; carotids brisk and full Clear to ausculation  *Regular rate and rhythm, no murmurs gallops or rub Soft with active bowel sounds No clubbing cyanosis 2+ Edema Alert and oriented, grossly normal motor and sensory function Skin Warm and Dry  ECG NSR  Assessment and  Plan

## 2013-03-29 NOTE — Patient Instructions (Addendum)
Your physician has recommended you make the following change in your medication:  1) Stop Amiodarone 2) Increase Furosemide to 80 mg daily  Your physician has requested that you have an echocardiogram. Echocardiography is a painless test that uses sound waves to create images of your heart. It provides your doctor with information about the size and shape of your heart and how well your heart's chambers and valves are working. This procedure takes approximately one hour. There are no restrictions for this procedure.  Your physician recommends that you schedule a follow-up appointment with Dr. Burt Knack soon.  Your physician recommends that you schedule a follow-up appointment in: 6 weeks with [name].

## 2013-03-30 NOTE — Assessment & Plan Note (Signed)
As above Will have him reestablish with Dr [facility]

## 2013-03-30 NOTE — Assessment & Plan Note (Signed)
No clinical recurrences  He would like to be engaged with Dr Judye Bos at [facility] so they will work to establish that connection I am glad to help as they desire

## 2013-03-30 NOTE — Assessment & Plan Note (Signed)
As above.

## 2013-03-30 NOTE — Assessment & Plan Note (Signed)
He has evidence of worsening CHF.  Symptom relief will be sought with increasing his diuretics We will review his echo to assure stable valve and LV function We will discontinue amio as data from rehab suggest that he may be chronotropically incompetent.  This will need to be reassessed after amio wash out and pacing may be indicated

## 2013-03-31 ENCOUNTER — Encounter (HOSPITAL_COMMUNITY)
Admission: RE | Admit: 2013-03-31 | Discharge: 2013-03-31 | Disposition: A | Discharge status: Home / Self Care | Payer: Medicare Other | Source: Ambulatory Visit | Attending: Internal Medicine | Admitting: Internal Medicine

## 2013-04-03 ENCOUNTER — Encounter (HOSPITAL_COMMUNITY)
Admission: RE | Admit: 2013-04-03 | Discharge: 2013-04-03 | Disposition: A | Discharge status: Home / Self Care | Payer: Medicare Other | Source: Ambulatory Visit | Attending: Internal Medicine | Admitting: Internal Medicine

## 2013-04-04 ENCOUNTER — Ambulatory Visit (INDEPENDENT_AMBULATORY_CARE_PROVIDER_SITE_OTHER): Payer: Medicare Other | Admitting: Critical Care Medicine

## 2013-04-04 ENCOUNTER — Encounter: Payer: Self-pay | Admitting: Critical Care Medicine

## 2013-04-04 VITALS — BP 140/68 | HR 79 | Temp 98.0°F | Ht 70.0 in | Wt 155.0 lb

## 2013-04-04 DIAGNOSIS — R0989 Other specified symptoms and signs involving the circulatory and respiratory systems: Secondary | ICD-10-CM

## 2013-04-04 DIAGNOSIS — R0609 Other forms of dyspnea: Secondary | ICD-10-CM | POA: Diagnosis not present

## 2013-04-04 DIAGNOSIS — I5022 Chronic systolic (congestive) heart failure: Secondary | ICD-10-CM | POA: Diagnosis not present

## 2013-04-04 NOTE — Assessment & Plan Note (Signed)
Chronic systolic heart failure improving Plan Per cardiology

## 2013-04-04 NOTE — Assessment & Plan Note (Signed)
Dyspnea on exertion likely due to chronic systolic heart failure and no primary lung disease There is an expiratory wheeze today this likely due to peribronchial cuffing from Chronic heart failure Plan The patient is encouraged to continue to use incentive spirometry

## 2013-04-04 NOTE — Progress Notes (Signed)
Subjective:    Patient ID: Caleb [name], male    DOB: 01-Aug-1940, 73 y.o.   MRN: [id number]  HPI  73 y.o.WM 04/04/2013 Chief Complaint  Patient presents with  . 2 month follow up    DOE is improving.  No cough.  Pt doing ok for now.  Pt noted a few nights ago, if exhale hears a nose in windpipe.  This is positional, right side down. No mucus. No chest pain.  SOme pndrip noted.  Notes less edema, on lasix.  Dyspnea is better.  In rehab and helps.   No palpitiations. Off amiodarone now one week  Past Medical History  Diagnosis Date  . CAD (coronary artery disease)   . Atrial fibrillation   . Cardiomyopathy, ischemic   . Heart failure, systolic, acute on chronic   . Hypertension   . Hyperlipidemia   . Pleural effusion   . Positional vertigo   . Dizziness   . GERD (gastroesophageal reflux disease)   . Atypical pneumonia   . Cough   . Allergic rhinitis   . TIA (transient ischemic attack)   . OSA (obstructive sleep apnea)     Home sleep test 07/05/2009 AHI 8.2  . Chronic anticoagulation      Family History  Problem Relation Age of Onset  . Adopted: Yes     History   Social History  . Marital Status: Married    Spouse Name: N/A    Number of Children: N/A  . Years of Education: N/A   Occupational History  . PHYSICIAN     Psychologist   Social History Main Topics  . Smoking status: Never Smoker   . Smokeless tobacco: Never Used  . Alcohol Use: Yes     Comment: Socially  . Drug Use: No  . Sexual Activity: Not on file   Other Topics Concern  . Not on file   Social History Narrative   Married   Gets regular exercise     Allergies  Allergen Reactions  . Carvedilol     Dizziness, uneasiness, alopecia  . Sulfonamide Derivatives     REACTION: rash  . Pradaxa [Dabigatran Etexilate Mesylate] Rash  . Xarelto [Rivaroxaban] Rash     Outpatient Prescriptions Prior to Visit  Medication Sig Dispense Refill  . Ascorbic Acid (VITAMIN C) 500 MG CAPS Take 2  capsules by mouth daily.      Marland Kitchen aspirin 81 MG tablet Take 81 mg by mouth daily.      . Magnesium 100 MG CAPS Take 1 capsule by mouth 2 (two) times daily.       . Magnesium Hydroxide (MILK OF MAGNESIA PO) Take by mouth as needed.      . nitroGLYCERIN (NITROSTAT) 0.4 MG SL tablet Place 0.4 mg under the tongue every 5 (five) minutes as needed. May repeat for up to 3 doses.       . Omega-3 Fatty Acids (THE VERY FINEST FISH OIL) LIQD Take 2-3 g by mouth daily.       . Potassium Chloride ER 20 MEQ TBCR Take 20 mEq by mouth 2 (two) times daily.       . pravastatin (PRAVACHOL) 40 MG tablet Take 1 tablet (40 mg total) by mouth daily. Please fill as BRAND ONLY.  Thank you.  30 tablet  6  . valsartan (DIOVAN) 40 MG tablet Take 1 tablet (40 mg total) by mouth daily.  30 tablet  1  . warfarin (COUMADIN) 2.5 MG tablet Take 1  tablet (2.5 mg total) by mouth as directed.  30 tablet  6  . Zinc 100 MG TABS Take by mouth daily.      . furosemide (LASIX) 40 MG tablet Take 2 tablets (80 mg total) by mouth daily.  60 tablet  11   No facility-administered medications prior to visit.    Review of Systems  Constitutional:   No  weight loss, night sweats,  Fevers, chills, fatigue, lassitude. HEENT:   No headaches,  Difficulty swallowing,  Tooth/dental problems,  Sore throat,                No sneezing, itching, ear ache, nasal congestion, post nasal drip,   CV:  No chest pain,  Orthopnea, PND, swelling in lower extremities, anasarca, dizziness, palpitations  GI  No heartburn, indigestion, abdominal pain, nausea, vomiting, diarrhea, change in bowel habits, loss of appetite  Resp: ++ shortness of breath with exertion not at rest.  No excess mucus, no productive cough,  No non-productive cough,  No coughing up of blood.  No change in color of mucus.  No wheezing.  No chest wall deformity  Skin: no rash or lesions.  GU: no dysuria, change in color of urine, no urgency or frequency.  No flank pain.  MS:  No joint  pain or swelling.  No decreased range of motion.  No back pain.  Psych:  No change in mood or affect. No depression or anxiety.  No memory loss.     Objective:   Physical Exam  Filed Vitals:   04/04/13 1644  BP: 140/68  Pulse: 79  Temp: 98 F (36.7 C)  TempSrc: Oral  Height: _0  (1.778 m)  Weight: 155 lb (70.308 kg)  SpO2: 99%    Gen: Pleasant, well-nourished, in no distress,  normal affect  ENT: No lesions,  mouth clear,  oropharynx clear, no postnasal drip  Neck: No JVD, no TMG, no carotid bruits  Lungs: No use of accessory muscles, no dullness to percussion, no rales and minimal expiratory squeak  Cardiovascular: reg reg   heart sounds normal, no murmur or gallops, 1+ peripheral edema  Abdomen: soft and NT, no HSM,  BS normal  Musculoskeletal: No deformities, no cyanosis or clubbing  Neuro: alert, non focal  Skin: Warm, no lesions or rashes     Assessment & Plan:   Chronic systolic heart failure Chronic systolic heart failure improving Plan Per cardiology  Dyspnea on exertion Dyspnea on exertion likely due to chronic systolic heart failure and no primary lung disease There is an expiratory wheeze today this likely due to peribronchial cuffing from Chronic heart failure Plan The patient is encouraged to continue to use incentive spirometry   Updated Medication List Outpatient Encounter Prescriptions as of 04/04/2013  Medication Sig  . Ascorbic Acid (VITAMIN C) 500 MG CAPS Take 2 capsules by mouth daily.  Marland Kitchen aspirin 81 MG tablet Take 81 mg by mouth daily.  . furosemide (LASIX) 40 MG tablet Take 40 mg by mouth daily.  . Magnesium 100 MG CAPS Take 1 capsule by mouth 2 (two) times daily.   . Magnesium Hydroxide (MILK OF MAGNESIA PO) Take by mouth as needed.  . nitroGLYCERIN (NITROSTAT) 0.4 MG SL tablet Place 0.4 mg under the tongue every 5 (five) minutes as needed. May repeat for up to 3 doses.   . Omega-3 Fatty Acids (THE VERY FINEST FISH OIL) LIQD Take  2-3 g by mouth daily.   . Potassium Chloride ER 20 MEQ  TBCR Take 20 mEq by mouth 2 (two) times daily.   . pravastatin (PRAVACHOL) 40 MG tablet Take 1 tablet (40 mg total) by mouth daily. Please fill as BRAND ONLY.  Thank you.  . valsartan (DIOVAN) 40 MG tablet Take 1 tablet (40 mg total) by mouth daily.  Marland Kitchen warfarin (COUMADIN) 2.5 MG tablet Take 1 tablet (2.5 mg total) by mouth as directed.  . Zinc 100 MG TABS Take by mouth daily.  . [DISCONTINUED] furosemide (LASIX) 40 MG tablet Take 2 tablets (80 mg total) by mouth daily.

## 2013-04-04 NOTE — Patient Instructions (Signed)
No change in medications. 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

## 2013-04-05 ENCOUNTER — Encounter: Payer: Self-pay | Admitting: Cardiovascular Disease

## 2013-04-05 ENCOUNTER — Encounter (HOSPITAL_COMMUNITY)
Admission: RE | Admit: 2013-04-05 | Discharge: 2013-04-05 | Disposition: A | Discharge status: Home / Self Care | Payer: Medicare Other | Source: Ambulatory Visit | Attending: Internal Medicine | Admitting: Internal Medicine

## 2013-04-05 ENCOUNTER — Ambulatory Visit (INDEPENDENT_AMBULATORY_CARE_PROVIDER_SITE_OTHER): Payer: Medicare Other | Admitting: Cardiovascular Disease

## 2013-04-05 VITALS — BP 138/78 | HR 80 | Ht 70.0 in | Wt 155.0 lb

## 2013-04-05 DIAGNOSIS — R0602 Shortness of breath: Secondary | ICD-10-CM

## 2013-04-05 DIAGNOSIS — I4891 Unspecified atrial fibrillation: Secondary | ICD-10-CM | POA: Diagnosis not present

## 2013-04-05 DIAGNOSIS — I5022 Chronic systolic (congestive) heart failure: Secondary | ICD-10-CM

## 2013-04-05 NOTE — Patient Instructions (Signed)
Your physician has requested that you have an exercise tolerance test. For further information please visit [email or web address]. Please also follow instruction sheet, as given.  Your physician recommends that you return for lab work on same day as treadmill test (BMET, BNP)  Your physician recommends that you schedule a follow-up appointment in: 3 months with Dr. Burt Knack

## 2013-04-05 NOTE — Progress Notes (Signed)
HPI:  Caleb [name] returns for follow-up cardiac evaluation. He has a history of ischemic cardiomyopathy dating back to 2004 when he presented with an anterior wall MI. He's also had atrial fibrillation, mitral regurgitation, and a dilated aortic root. He has residual CAD with severe stenosis in the LAD which was his infarct vessel back in 2004. He went to [facility] where he underwent mitral valve repair, aortic root surgery, Cryo/Cuff Maze procedure, and single-vessel bypass with the LIMA to LAD. Postoperatively he required thoracentesis for treatment of a left pleural effusion. He was on amiodarone because of postoperative atrial fibrillation. He returned to [facility] about one month after surgery.  The patient has been making slow progress. His breathing is improving. When he saw [name] last week, his Lasix was increased. He has been taking 60 mg daily. Leg swelling is improved. He denies orthopnea, PND, or chest pain. He continues to have dyspnea with exertion. He is working with cardiac rehabilitation regularly.  Outpatient Encounter Prescriptions as of 04/05/2013  Medication Sig  . Ascorbic Acid (VITAMIN C) 500 MG CAPS Take 2 capsules by mouth daily.  Marland Kitchen aspirin 81 MG tablet Take 81 mg by mouth daily.  . furosemide (LASIX) 40 MG tablet Take 40 mg by mouth daily.  . Magnesium 100 MG CAPS Take 1 capsule by mouth 2 (two) times daily.   . Magnesium Hydroxide (MILK OF MAGNESIA PO) Take by mouth as needed.  . nitroGLYCERIN (NITROSTAT) 0.4 MG SL tablet Place 0.4 mg under the tongue every 5 (five) minutes as needed. May repeat for up to 3 doses.   . Omega-3 Fatty Acids (THE VERY FINEST FISH OIL) LIQD Take 2-3 g by mouth daily.   . Potassium Chloride ER 20 MEQ TBCR Take 20 mEq by mouth 2 (two) times daily.   . pravastatin (PRAVACHOL) 40 MG tablet Take 1 tablet (40 mg total) by mouth daily. Please fill as BRAND ONLY.  Thank you.  . valsartan (DIOVAN) 40 MG tablet Take 1 tablet (40 mg total) by  mouth daily.  Marland Kitchen warfarin (COUMADIN) 2.5 MG tablet Take 1 tablet (2.5 mg total) by mouth as directed.  . Zinc 100 MG TABS Take by mouth daily.    Allergies  Allergen Reactions  . Carvedilol     Dizziness, uneasiness, alopecia  . Sulfonamide Derivatives     REACTION: rash  . Pradaxa [Dabigatran Etexilate Mesylate] Rash  . Xarelto [Rivaroxaban] Rash    Past Medical History  Diagnosis Date  . CAD (coronary artery disease)   . Atrial fibrillation   . Cardiomyopathy, ischemic   . Heart failure, systolic, acute on chronic   . Hypertension   . Hyperlipidemia   . Pleural effusion   . Positional vertigo   . Dizziness   . GERD (gastroesophageal reflux disease)   . Atypical pneumonia   . Cough   . Allergic rhinitis   . TIA (transient ischemic attack)   . OSA (obstructive sleep apnea)     Home sleep test 07/05/2009 AHI 8.2  . Chronic anticoagulation     ROS: Negative except as per HPI  BP 138/78  Pulse 80  Ht _0  (1.778 m)  Wt 155 lb (70.308 kg)  BMI 22.24 kg/m2  PHYSICAL EXAM: Pt is alert and oriented, NAD HEENT: normal Neck: JVP - normal, carotids 2+= without bruits Lungs: CTA bilaterally CV: RRR without murmur or gallop Abd: soft, NT, Positive BS, no hepatomegaly Ext: 1+ pretibial edema on the left and trace on  the right, distal pulses intact and equal Skin: warm/dry no rash  ASSESSMENT AND PLAN: 1. Chronic systolic heart failure, New York Heart Association class 2. He seems to be recovering well from surgery. A 2-D echocardiogram is ordered for next week. He is off of amiodarone now as well as beta blockers because of concern for chronotropic incompetence. Long-term I would anticipate that we put him back on metoprolol succinate at all possible. This will be important in treatment of his congestive heart failure. I am going to set up a treadmill study in a few weeks to assess for chronotropic incompetence. We also discussed consideration of Aldactone, but he has been  intolerant of this in the past.  2. Atrial fibrillation. He is status post Cryo/Cox Maze surgery. He remains on warfarin. Seeking opinion from Dr Ola Spurr. Off of amiodarone now. Scheduled to see Dr Caryl Comes in February.  3. CAD - s/p LIMA-LAD. No angina.  For follow-up, will check a BMET in a few weeks when he comes in for the treadmill study. I will see him back for follow-up in 3 months. Consider addition of metoprolol succinate after treadmill study.  Sherren Mocha 04/05/2013 12:16 PM

## 2013-04-07 ENCOUNTER — Encounter (HOSPITAL_COMMUNITY)
Admission: RE | Admit: 2013-04-07 | Discharge: 2013-04-07 | Disposition: A | Discharge status: Home / Self Care | Payer: Medicare Other | Source: Ambulatory Visit | Attending: Internal Medicine | Admitting: Internal Medicine

## 2013-04-10 ENCOUNTER — Ambulatory Visit: Payer: Medicare Other | Admitting: Critical Care Medicine

## 2013-04-10 ENCOUNTER — Encounter (HOSPITAL_COMMUNITY)
Admission: RE | Admit: 2013-04-10 | Discharge: 2013-04-10 | Disposition: A | Discharge status: Home / Self Care | Payer: Medicare Other | Source: Ambulatory Visit | Attending: Internal Medicine | Admitting: Internal Medicine

## 2013-04-12 ENCOUNTER — Encounter (HOSPITAL_COMMUNITY)
Admission: RE | Admit: 2013-04-12 | Discharge: 2013-04-12 | Disposition: A | Discharge status: Home / Self Care | Payer: Medicare Other | Source: Ambulatory Visit | Attending: Internal Medicine | Admitting: Internal Medicine

## 2013-04-14 ENCOUNTER — Ambulatory Visit (INDEPENDENT_AMBULATORY_CARE_PROVIDER_SITE_OTHER): Payer: Medicare Other | Admitting: Pharmacist

## 2013-04-14 ENCOUNTER — Encounter: Payer: Self-pay | Admitting: Cardiology

## 2013-04-14 ENCOUNTER — Encounter (HOSPITAL_COMMUNITY)
Admission: RE | Admit: 2013-04-14 | Discharge: 2013-04-14 | Disposition: A | Discharge status: Home / Self Care | Payer: Medicare Other | Source: Ambulatory Visit | Attending: Internal Medicine | Admitting: Internal Medicine

## 2013-04-14 ENCOUNTER — Ambulatory Visit (HOSPITAL_COMMUNITY): Payer: Medicare Other | Attending: Internal Medicine | Admitting: Radiology

## 2013-04-14 DIAGNOSIS — I059 Rheumatic mitral valve disease, unspecified: Secondary | ICD-10-CM

## 2013-04-14 DIAGNOSIS — I080 Rheumatic disorders of both mitral and aortic valves: Secondary | ICD-10-CM | POA: Insufficient documentation

## 2013-04-14 DIAGNOSIS — E785 Hyperlipidemia, unspecified: Secondary | ICD-10-CM | POA: Insufficient documentation

## 2013-04-14 DIAGNOSIS — Z7901 Long term (current) use of anticoagulants: Secondary | ICD-10-CM

## 2013-04-14 DIAGNOSIS — I4891 Unspecified atrial fibrillation: Secondary | ICD-10-CM | POA: Diagnosis not present

## 2013-04-14 DIAGNOSIS — G4733 Obstructive sleep apnea (adult) (pediatric): Secondary | ICD-10-CM | POA: Insufficient documentation

## 2013-04-14 DIAGNOSIS — R5383 Other fatigue: Secondary | ICD-10-CM

## 2013-04-14 DIAGNOSIS — Z5181 Encounter for therapeutic drug level monitoring: Secondary | ICD-10-CM

## 2013-04-14 DIAGNOSIS — R0602 Shortness of breath: Secondary | ICD-10-CM

## 2013-04-14 DIAGNOSIS — I2589 Other forms of chronic ischemic heart disease: Secondary | ICD-10-CM | POA: Diagnosis not present

## 2013-04-14 DIAGNOSIS — I079 Rheumatic tricuspid valve disease, unspecified: Secondary | ICD-10-CM | POA: Insufficient documentation

## 2013-04-14 LAB — POCT INR: INR: 2

## 2013-04-14 NOTE — Progress Notes (Signed)
Echocardiogram performed.  

## 2013-04-17 ENCOUNTER — Encounter (HOSPITAL_COMMUNITY)
Admission: RE | Admit: 2013-04-17 | Discharge: 2013-04-17 | Disposition: A | Discharge status: Home / Self Care | Payer: Medicare Other | Source: Ambulatory Visit | Attending: Internal Medicine | Admitting: Internal Medicine

## 2013-04-19 ENCOUNTER — Encounter (HOSPITAL_COMMUNITY)
Admission: RE | Admit: 2013-04-19 | Discharge: 2013-04-19 | Disposition: A | Discharge status: Home / Self Care | Payer: Medicare Other | Source: Ambulatory Visit | Attending: Internal Medicine | Admitting: Internal Medicine

## 2013-04-21 ENCOUNTER — Encounter (HOSPITAL_COMMUNITY)
Admission: RE | Admit: 2013-04-21 | Discharge: 2013-04-21 | Disposition: A | Discharge status: Home / Self Care | Payer: Medicare Other | Source: Ambulatory Visit | Attending: Internal Medicine | Admitting: Internal Medicine

## 2013-04-24 ENCOUNTER — Encounter (HOSPITAL_COMMUNITY)
Admission: RE | Admit: 2013-04-24 | Discharge: 2013-04-24 | Disposition: A | Discharge status: Home / Self Care | Payer: Medicare Other | Source: Ambulatory Visit | Attending: Internal Medicine | Admitting: Internal Medicine

## 2013-04-24 DIAGNOSIS — Z951 Presence of aortocoronary bypass graft: Secondary | ICD-10-CM | POA: Insufficient documentation

## 2013-04-24 DIAGNOSIS — I509 Heart failure, unspecified: Secondary | ICD-10-CM | POA: Insufficient documentation

## 2013-04-24 DIAGNOSIS — I428 Other cardiomyopathies: Secondary | ICD-10-CM | POA: Insufficient documentation

## 2013-04-24 DIAGNOSIS — Z79899 Other long term (current) drug therapy: Secondary | ICD-10-CM | POA: Diagnosis not present

## 2013-04-24 DIAGNOSIS — I2510 Atherosclerotic heart disease of native coronary artery without angina pectoris: Secondary | ICD-10-CM | POA: Diagnosis not present

## 2013-04-24 DIAGNOSIS — I059 Rheumatic mitral valve disease, unspecified: Secondary | ICD-10-CM | POA: Diagnosis not present

## 2013-04-26 ENCOUNTER — Encounter (HOSPITAL_COMMUNITY)
Admission: RE | Admit: 2013-04-26 | Discharge: 2013-04-26 | Disposition: A | Discharge status: Home / Self Care | Payer: Medicare Other | Source: Ambulatory Visit | Attending: Internal Medicine | Admitting: Internal Medicine

## 2013-04-27 ENCOUNTER — Telehealth: Payer: Self-pay | Admitting: Internal Medicine

## 2013-04-27 NOTE — Telephone Encounter (Signed)
Patient is returning your call, he will be home the rest of the day. Please call back

## 2013-04-28 ENCOUNTER — Encounter (HOSPITAL_COMMUNITY): Payer: Medicare Other

## 2013-05-01 ENCOUNTER — Encounter (HOSPITAL_COMMUNITY)
Admission: RE | Admit: 2013-05-01 | Discharge: 2013-05-01 | Disposition: A | Discharge status: Home / Self Care | Payer: Medicare Other | Source: Ambulatory Visit | Attending: Internal Medicine | Admitting: Internal Medicine

## 2013-05-02 NOTE — Telephone Encounter (Signed)
A user error has taken place: encounter opened in error, closed for administrative reasons.  See 04/14/13 echo results for documentaion

## 2013-05-03 ENCOUNTER — Encounter (HOSPITAL_COMMUNITY)
Admission: RE | Admit: 2013-05-03 | Discharge: 2013-05-03 | Disposition: A | Discharge status: Home / Self Care | Payer: Medicare Other | Source: Ambulatory Visit | Attending: Internal Medicine | Admitting: Internal Medicine

## 2013-05-03 ENCOUNTER — Telehealth: Payer: Self-pay | Admitting: Cardiology

## 2013-05-03 ENCOUNTER — Ambulatory Visit (HOSPITAL_COMMUNITY)
Admission: RE | Admit: 2013-05-03 | Discharge: 2013-05-03 | Disposition: A | Discharge status: Home / Self Care | Payer: Medicare Other | Source: Ambulatory Visit | Attending: Family Medicine | Admitting: Family Medicine

## 2013-05-03 DIAGNOSIS — R9431 Abnormal electrocardiogram [ECG] [EKG]: Secondary | ICD-10-CM | POA: Insufficient documentation

## 2013-05-03 NOTE — Progress Notes (Signed)
Whitt complains of feeling anxious this afternoon. On the monitor telemetry rhythm questionable sinus with frequent PAC's versus Atrial fibrillation. Blood pressure 118/72.  [name] FNP-C called and notified. !2 lead ECG obtained showed Sinus Rhythm.  [name] FNP-C faxed ECG to review along with ECG tracings from cardiac rehab.  Maceo did not exercise today but plans to return to exercise on Friday. Will fax exercise flow sheets to Dr. Olin Pia office for review. Bexley says he is having a stress test tomorrow morning.

## 2013-05-03 NOTE — Telephone Encounter (Signed)
Cardiac rehab called today concerned that pt was in atrial fib.  Strips most appear to be SR with freq PACs though one brief period appears irreg irreg.  EKG was done and is SR.  Pt for stress tomorrow which he should proceed with.  Strips will be sent to [name].  Pt does have hx a fib and  Cryo/Cuff Maze procedure and single-vessel bypass with the LIMA to LAD.

## 2013-05-04 ENCOUNTER — Ambulatory Visit (HOSPITAL_COMMUNITY)
Admission: RE | Admit: 2013-05-04 | Discharge: 2013-05-04 | Disposition: A | Discharge status: Home / Self Care | Payer: Medicare Other | Source: Ambulatory Visit | Attending: Internal Medicine | Admitting: Internal Medicine

## 2013-05-04 DIAGNOSIS — I509 Heart failure, unspecified: Secondary | ICD-10-CM | POA: Insufficient documentation

## 2013-05-04 DIAGNOSIS — I4891 Unspecified atrial fibrillation: Secondary | ICD-10-CM | POA: Diagnosis not present

## 2013-05-04 DIAGNOSIS — I5022 Chronic systolic (congestive) heart failure: Secondary | ICD-10-CM

## 2013-05-05 ENCOUNTER — Encounter (HOSPITAL_COMMUNITY)
Admission: RE | Admit: 2013-05-05 | Discharge: 2013-05-05 | Disposition: A | Discharge status: Home / Self Care | Payer: Medicare Other | Source: Ambulatory Visit | Attending: Internal Medicine | Admitting: Internal Medicine

## 2013-05-05 ENCOUNTER — Ambulatory Visit (INDEPENDENT_AMBULATORY_CARE_PROVIDER_SITE_OTHER): Payer: Medicare Other | Admitting: *Deleted

## 2013-05-05 DIAGNOSIS — I4891 Unspecified atrial fibrillation: Secondary | ICD-10-CM

## 2013-05-05 DIAGNOSIS — Z5181 Encounter for therapeutic drug level monitoring: Secondary | ICD-10-CM | POA: Diagnosis not present

## 2013-05-05 LAB — POCT INR: INR: 1.7

## 2013-05-05 NOTE — Progress Notes (Addendum)
Patient heart rate still appears to be irregular.  Blood pressure 134/72. [name] FNP-C called and notified.  [name] FNP-C said Caleb [name] is okay to exercise today. Will continue to monitor the patient throughout  the program. Resting heart rate 99 which is higher than it normally is. Caleb [name] did not have any complaints today during exercise except feeling like his heart rate is irregular.

## 2013-05-08 ENCOUNTER — Encounter: Payer: Medicare Other | Admitting: Physician Assistant

## 2013-05-08 ENCOUNTER — Encounter (HOSPITAL_COMMUNITY): Payer: Medicare Other

## 2013-05-08 ENCOUNTER — Other Ambulatory Visit: Payer: Medicare Other

## 2013-05-10 ENCOUNTER — Encounter (HOSPITAL_COMMUNITY): Payer: Medicare Other

## 2013-05-10 ENCOUNTER — Telehealth (HOSPITAL_COMMUNITY): Payer: Self-pay | Admitting: Family Medicine

## 2013-05-11 ENCOUNTER — Encounter: Payer: Self-pay | Admitting: Internal Medicine

## 2013-05-11 ENCOUNTER — Ambulatory Visit (INDEPENDENT_AMBULATORY_CARE_PROVIDER_SITE_OTHER): Payer: Medicare Other | Admitting: Internal Medicine

## 2013-05-11 VITALS — BP 122/80 | HR 112 | Ht 70.0 in | Wt 155.0 lb

## 2013-05-11 DIAGNOSIS — I2589 Other forms of chronic ischemic heart disease: Secondary | ICD-10-CM

## 2013-05-11 DIAGNOSIS — E785 Hyperlipidemia, unspecified: Secondary | ICD-10-CM

## 2013-05-11 DIAGNOSIS — I5022 Chronic systolic (congestive) heart failure: Secondary | ICD-10-CM

## 2013-05-11 DIAGNOSIS — R0602 Shortness of breath: Secondary | ICD-10-CM

## 2013-05-11 DIAGNOSIS — I4891 Unspecified atrial fibrillation: Secondary | ICD-10-CM

## 2013-05-11 DIAGNOSIS — I255 Ischemic cardiomyopathy: Secondary | ICD-10-CM

## 2013-05-11 LAB — BASIC METABOLIC PANEL
BUN: 20 mg/dL (ref 6–23)
CHLORIDE: 105 meq/L (ref 96–112)
CO2: 24 meq/L (ref 19–32)
Calcium: 9.9 mg/dL (ref 8.4–10.5)
Creatinine, Ser: 1.1 mg/dL (ref 0.4–1.5)
GFR: 66.64 mL/min (ref >60.00)
GLUCOSE: 118 mg/dL — AB (ref 70–99)
POTASSIUM: 4.3 meq/L (ref 3.5–5.1)
SODIUM: 141 meq/L (ref 135–145)

## 2013-05-11 LAB — TSH: TSH: 2.24 u[IU]/mL (ref 0.35–5.50)

## 2013-05-11 LAB — LIPID PANEL
Cholesterol: 225 mg/dL — ABNORMAL HIGH (ref 0–200)
HDL: 72.6 mg/dL (ref >39.00)
Total CHOL/HDL Ratio: 3
Triglycerides: 39 mg/dL (ref 0.0–149.0)
VLDL: 7.8 mg/dL (ref 0.0–40.0)

## 2013-05-11 LAB — HEPATIC FUNCTION PANEL
ALT: 22 U/L (ref 0–53)
AST: 19 U/L (ref 0–37)
Albumin: 4.2 g/dL (ref 3.5–5.2)
Alkaline Phosphatase: 51 U/L (ref 39–117)
BILIRUBIN DIRECT: 0.1 mg/dL (ref 0.0–0.3)
Total Bilirubin: 0.8 mg/dL (ref 0.3–1.2)
Total Protein: 7.6 g/dL (ref 6.0–8.3)

## 2013-05-11 LAB — BRAIN NATRIURETIC PEPTIDE: Pro B Natriuretic peptide (BNP): 313 pg/mL — ABNORMAL HIGH (ref 0.0–100.0)

## 2013-05-11 LAB — T4, FREE: Free T4: 1.13 ng/dL (ref 0.60–1.60)

## 2013-05-11 LAB — T3, FREE: T3, Free: 2.5 pg/mL (ref 2.3–4.2)

## 2013-05-11 LAB — LDL CHOLESTEROL, DIRECT: LDL DIRECT: 143.9 mg/dL

## 2013-05-11 MED ORDER — FUROSEMIDE 40 MG PO TABS: ORAL_TABLET | ORAL | Status: DC

## 2013-05-11 NOTE — Progress Notes (Signed)
Patient Care Team: Dorena Cookey, MD as PCP - General Elsie Stain, MD (Pulmonary Disease)   HPI  Caleb [name] is a 73 y.o. male Seen in followup for atrial arrhythmias in the context of long-standing atrial fibrillation. He has a history of ischemic heart disease dating back to 2004 when he presented with an anterior wall MI. He had progressive mitral regurgitation and a dilated aortic root and he went to [place] underwent mitral valve repair aortic root surgery and a CryoMaze He also had a single vessel bypass with a LIMA to his LAD Echocardiogram 1/15 it demonstrated ejection fraction 20-25% with moderate AR mild MR with a prosthestic ring. He saw Dr. Burt Knack about a month ago I had seen him a week or so before then and we discussed his arrhythmia followup to be consummated with Dr. Adrian Prows  Well in cardiac rehabilitation he was thought to have sinus rhythm with frequent PACs. A stress test demonstrating chronotropic incompetence with a maximal heart rate of 70.  Past Medical History  Diagnosis Date  . CAD (coronary artery disease)   . Atrial fibrillation   . Cardiomyopathy, ischemic   . Heart failure, systolic, acute on chronic   . Hypertension   . Hyperlipidemia   . Pleural effusion   . Positional vertigo   . Dizziness   . GERD (gastroesophageal reflux disease)   . Atypical pneumonia   . Cough   . Allergic rhinitis   . TIA (transient ischemic attack)   . OSA (obstructive sleep apnea)     Home sleep test 07/05/2009 AHI 8.2  . Chronic anticoagulation     Past Surgical History  Procedure Laterality Date  . Coronary stent placement  2004    LAD  . Shoulder surgery    . Tee without cardioversion N/A 09/21/2012    Procedure: TRANSESOPHAGEAL ECHOCARDIOGRAM (TEE);  Surgeon: Larey Dresser, MD;  Location: [facility] ENDOSCOPY;  Service: Cardiovascular;  Laterality: N/A;  . Coronary artery bypass graft  01/09/13    LAD LIMA, left atrial appendage  .  Mitral valve annuloplasty  01/09/13  . Aortic valve repair  01/09/13    Current Outpatient Prescriptions  Medication Sig Dispense Refill  . Ascorbic Acid (VITAMIN C) 500 MG CAPS Take 2 capsules by mouth daily.      Marland Kitchen aspirin 81 MG tablet Take 81 mg by mouth daily.      . furosemide (LASIX) 40 MG tablet Take 40 mg by mouth daily.      . Magnesium 100 MG CAPS Take 1 capsule by mouth 2 (two) times daily.       . Magnesium Hydroxide (MILK OF MAGNESIA PO) Take by mouth as needed.      . nitroGLYCERIN (NITROSTAT) 0.4 MG SL tablet Place 0.4 mg under the tongue every 5 (five) minutes as needed. May repeat for up to 3 doses.       . Omega-3 Fatty Acids (THE VERY FINEST FISH OIL) LIQD Take 2-3 g by mouth daily.       . Potassium Chloride ER 20 MEQ TBCR Take 20 mEq by mouth 2 (two) times daily.       . pravastatin (PRAVACHOL) 40 MG tablet Take 1 tablet (40 mg total) by mouth daily. Please fill as BRAND ONLY.  Thank you.  30 tablet  6  . valsartan (DIOVAN) 40 MG tablet Take 1 tablet (40 mg total) by mouth daily.  30 tablet  1  .  warfarin (COUMADIN) 2.5 MG tablet Take 1 tablet (2.5 mg total) by mouth as directed.  30 tablet  6  . Zinc 100 MG TABS Take by mouth daily.      . [DISCONTINUED] eplerenone (INSPRA) 25 MG tablet Take 1 tablet (25 mg total) by mouth daily.  30 tablet  11   No current facility-administered medications for this visit.    Allergies  Allergen Reactions  . Carvedilol     Dizziness, uneasiness, alopecia  . Sulfonamide Derivatives     REACTION: rash  . Pradaxa [Dabigatran Etexilate Mesylate] Rash  . Xarelto [Rivaroxaban] Rash    Review of Systems negative except from HPI and PMH  Physical Exam BP 122/80  Pulse 112  Ht _0  (1.778 m)  Wt 155 lb (70.308 kg)  BMI 22.24 kg/m2 Well developed and well nourished in no acute distress HENT normal E scleral and icterus clear Neck Supple JVP flat; carotids brisk and full Clear to ausculation  Regular rate and rhythm, no  murmurs gallops or rub Soft with active bowel sounds No clubbing cyanosis none Edema Alert and oriented, grossly normal motor and sensory function Skin Warm and Dry   ECG demonstrated atypical atrial flutter Assessment and  Plan  We had a >35 min lengthy discussion regarding his atypical atrial arrhythmias. He is planning to see Dr. Adrian Prows next week. They will discuss repeat catheter ablation. We'll plan to see him as he requests to support work up Dr. Wallis Mart of Dr. Burt Knack.

## 2013-05-11 NOTE — Patient Instructions (Addendum)
Your physician has recommended you make the following change in your medication:  1) Alternate Furosemide - take 80 mg one morning, 40 mg next morning, keep alternating   Your physician recommends that you return for lab work in: TSH, T3, T4, BNP, BMET   Your physician recommends that you schedule a follow-up appointment in: 4 months with [name].

## 2013-05-12 ENCOUNTER — Telehealth: Payer: Self-pay | Admitting: Internal Medicine

## 2013-05-12 ENCOUNTER — Telehealth: Payer: Self-pay | Admitting: *Deleted

## 2013-05-12 ENCOUNTER — Encounter (HOSPITAL_COMMUNITY)
Admission: RE | Admit: 2013-05-12 | Discharge: 2013-05-12 | Disposition: A | Discharge status: Home / Self Care | Payer: Medicare Other | Source: Ambulatory Visit | Attending: Internal Medicine | Admitting: Internal Medicine

## 2013-05-12 MED ORDER — WARFARIN SODIUM 5 MG PO TABS: ORAL_TABLET | ORAL | Status: DC

## 2013-05-12 NOTE — Telephone Encounter (Signed)
Spoke with pt.  Rx refill sent

## 2013-05-12 NOTE — Telephone Encounter (Signed)
pt notified about lab results with verbal understanding.Pt was asked about changing statin to either crestor or lipitor from pravastatin. pt said he wants to research 1st then will let know. SW,PA aware.

## 2013-05-12 NOTE — Telephone Encounter (Signed)
New message   Patient calling need a medication  dosage  Increase  5 mg - warfarin. Please advise

## 2013-05-15 ENCOUNTER — Encounter (HOSPITAL_COMMUNITY)
Admission: RE | Admit: 2013-05-15 | Discharge: 2013-05-15 | Disposition: A | Discharge status: Home / Self Care | Payer: Medicare Other | Source: Ambulatory Visit | Attending: Internal Medicine | Admitting: Internal Medicine

## 2013-05-17 ENCOUNTER — Encounter (HOSPITAL_COMMUNITY): Payer: Medicare Other

## 2013-05-17 DIAGNOSIS — Z951 Presence of aortocoronary bypass graft: Secondary | ICD-10-CM | POA: Diagnosis not present

## 2013-05-17 DIAGNOSIS — I4891 Unspecified atrial fibrillation: Secondary | ICD-10-CM | POA: Diagnosis not present

## 2013-05-17 DIAGNOSIS — Z9889 Other specified postprocedural states: Secondary | ICD-10-CM | POA: Diagnosis not present

## 2013-05-17 DIAGNOSIS — I059 Rheumatic mitral valve disease, unspecified: Secondary | ICD-10-CM | POA: Diagnosis not present

## 2013-05-17 DIAGNOSIS — I2510 Atherosclerotic heart disease of native coronary artery without angina pectoris: Secondary | ICD-10-CM | POA: Diagnosis not present

## 2013-05-19 ENCOUNTER — Ambulatory Visit (INDEPENDENT_AMBULATORY_CARE_PROVIDER_SITE_OTHER): Payer: Medicare Other | Admitting: Pharmacist

## 2013-05-19 ENCOUNTER — Encounter (HOSPITAL_COMMUNITY)
Admission: RE | Admit: 2013-05-19 | Discharge: 2013-05-19 | Disposition: A | Discharge status: Home / Self Care | Payer: Medicare Other | Source: Ambulatory Visit | Attending: Internal Medicine | Admitting: Internal Medicine

## 2013-05-19 DIAGNOSIS — I4891 Unspecified atrial fibrillation: Secondary | ICD-10-CM

## 2013-05-19 DIAGNOSIS — Z5181 Encounter for therapeutic drug level monitoring: Secondary | ICD-10-CM | POA: Diagnosis not present

## 2013-05-19 DIAGNOSIS — Z7901 Long term (current) use of anticoagulants: Secondary | ICD-10-CM | POA: Diagnosis not present

## 2013-05-19 LAB — POCT INR: INR: 1.9

## 2013-05-22 ENCOUNTER — Encounter (HOSPITAL_COMMUNITY)
Admission: RE | Admit: 2013-05-22 | Discharge: 2013-05-22 | Disposition: A | Discharge status: Home / Self Care | Payer: Medicare Other | Source: Ambulatory Visit | Attending: Internal Medicine | Admitting: Internal Medicine

## 2013-05-22 DIAGNOSIS — I509 Heart failure, unspecified: Secondary | ICD-10-CM | POA: Insufficient documentation

## 2013-05-22 DIAGNOSIS — Z79899 Other long term (current) drug therapy: Secondary | ICD-10-CM | POA: Diagnosis not present

## 2013-05-22 DIAGNOSIS — Z951 Presence of aortocoronary bypass graft: Secondary | ICD-10-CM | POA: Diagnosis not present

## 2013-05-22 DIAGNOSIS — I2510 Atherosclerotic heart disease of native coronary artery without angina pectoris: Secondary | ICD-10-CM | POA: Insufficient documentation

## 2013-05-22 DIAGNOSIS — I428 Other cardiomyopathies: Secondary | ICD-10-CM | POA: Insufficient documentation

## 2013-05-22 DIAGNOSIS — I059 Rheumatic mitral valve disease, unspecified: Secondary | ICD-10-CM | POA: Diagnosis not present

## 2013-05-24 ENCOUNTER — Encounter (HOSPITAL_COMMUNITY)
Admission: RE | Admit: 2013-05-24 | Discharge: 2013-05-24 | Disposition: A | Discharge status: Home / Self Care | Payer: Medicare Other | Source: Ambulatory Visit | Attending: Internal Medicine | Admitting: Internal Medicine

## 2013-05-26 ENCOUNTER — Encounter (HOSPITAL_COMMUNITY)
Admission: RE | Admit: 2013-05-26 | Discharge: 2013-05-26 | Disposition: A | Discharge status: Home / Self Care | Payer: Medicare Other | Source: Ambulatory Visit | Attending: Internal Medicine | Admitting: Internal Medicine

## 2013-05-26 ENCOUNTER — Ambulatory Visit (INDEPENDENT_AMBULATORY_CARE_PROVIDER_SITE_OTHER): Payer: Medicare Other | Admitting: *Deleted

## 2013-05-26 ENCOUNTER — Encounter: Payer: Self-pay | Admitting: Internal Medicine

## 2013-05-26 DIAGNOSIS — Z7901 Long term (current) use of anticoagulants: Secondary | ICD-10-CM

## 2013-05-26 DIAGNOSIS — Z5181 Encounter for therapeutic drug level monitoring: Secondary | ICD-10-CM | POA: Diagnosis not present

## 2013-05-26 DIAGNOSIS — I4891 Unspecified atrial fibrillation: Secondary | ICD-10-CM

## 2013-05-26 LAB — POCT INR: INR: 2.5

## 2013-05-26 NOTE — Progress Notes (Addendum)
Patient's heart rate went up to 145 on the airdyne at cardiac rehab. Patient appears to be  in atrial fib or flutter.  Exercise stopped. Mr Wenzlick did complain of feeling short of breath initially.  Oxygen saturation 99% on room air.  Blood pressure 140/70. Danna Hefty NP called and notified.  No new orders received. Mr Stick heart rate at rest went back to 80-100. Mr Eiland reported feeling better after sitting down. Mr Levitz plans to return to exercise on Monday. Will continue to monitor the patient throughout  the program. Will fax today's ECG tracing's to Dr Olin Pia office for review.

## 2013-05-29 ENCOUNTER — Encounter (HOSPITAL_COMMUNITY)
Admission: RE | Admit: 2013-05-29 | Discharge: 2013-05-29 | Disposition: A | Discharge status: Home / Self Care | Payer: Medicare Other | Source: Ambulatory Visit | Attending: Internal Medicine | Admitting: Internal Medicine

## 2013-05-31 ENCOUNTER — Encounter (HOSPITAL_COMMUNITY)
Admission: RE | Admit: 2013-05-31 | Discharge: 2013-05-31 | Disposition: A | Discharge status: Home / Self Care | Payer: Medicare Other | Source: Ambulatory Visit | Attending: Internal Medicine | Admitting: Internal Medicine

## 2013-06-02 ENCOUNTER — Encounter (HOSPITAL_COMMUNITY)
Admission: RE | Admit: 2013-06-02 | Discharge: 2013-06-02 | Disposition: A | Discharge status: Home / Self Care | Payer: Medicare Other | Source: Ambulatory Visit | Attending: Internal Medicine | Admitting: Internal Medicine

## 2013-06-02 ENCOUNTER — Ambulatory Visit (INDEPENDENT_AMBULATORY_CARE_PROVIDER_SITE_OTHER): Payer: Medicare Other | Admitting: *Deleted

## 2013-06-02 DIAGNOSIS — Z7901 Long term (current) use of anticoagulants: Secondary | ICD-10-CM | POA: Diagnosis not present

## 2013-06-02 DIAGNOSIS — Z5181 Encounter for therapeutic drug level monitoring: Secondary | ICD-10-CM | POA: Diagnosis not present

## 2013-06-02 DIAGNOSIS — I4891 Unspecified atrial fibrillation: Secondary | ICD-10-CM

## 2013-06-02 LAB — POCT INR: INR: 2.4

## 2013-06-09 ENCOUNTER — Ambulatory Visit (INDEPENDENT_AMBULATORY_CARE_PROVIDER_SITE_OTHER): Payer: Medicare Other | Admitting: *Deleted

## 2013-06-09 DIAGNOSIS — Z7901 Long term (current) use of anticoagulants: Secondary | ICD-10-CM | POA: Diagnosis not present

## 2013-06-09 DIAGNOSIS — Z5181 Encounter for therapeutic drug level monitoring: Secondary | ICD-10-CM | POA: Diagnosis not present

## 2013-06-09 DIAGNOSIS — I4891 Unspecified atrial fibrillation: Secondary | ICD-10-CM | POA: Diagnosis not present

## 2013-06-09 LAB — POCT INR: INR: 1.5

## 2013-06-12 DIAGNOSIS — N400 Enlarged prostate without lower urinary tract symptoms: Secondary | ICD-10-CM | POA: Diagnosis not present

## 2013-06-12 DIAGNOSIS — S3720XA Unspecified injury of bladder, initial encounter: Secondary | ICD-10-CM | POA: Diagnosis not present

## 2013-06-12 DIAGNOSIS — I4892 Unspecified atrial flutter: Secondary | ICD-10-CM | POA: Diagnosis not present

## 2013-06-12 DIAGNOSIS — K219 Gastro-esophageal reflux disease without esophagitis: Secondary | ICD-10-CM | POA: Diagnosis not present

## 2013-06-12 DIAGNOSIS — Z882 Allergy status to sulfonamides status: Secondary | ICD-10-CM | POA: Diagnosis not present

## 2013-06-12 DIAGNOSIS — I2510 Atherosclerotic heart disease of native coronary artery without angina pectoris: Secondary | ICD-10-CM | POA: Diagnosis not present

## 2013-06-12 DIAGNOSIS — Z7901 Long term (current) use of anticoagulants: Secondary | ICD-10-CM | POA: Diagnosis not present

## 2013-06-12 DIAGNOSIS — I509 Heart failure, unspecified: Secondary | ICD-10-CM | POA: Diagnosis not present

## 2013-06-12 DIAGNOSIS — Z7982 Long term (current) use of aspirin: Secondary | ICD-10-CM | POA: Diagnosis not present

## 2013-06-12 DIAGNOSIS — G4733 Obstructive sleep apnea (adult) (pediatric): Secondary | ICD-10-CM | POA: Diagnosis not present

## 2013-06-12 DIAGNOSIS — Z8673 Personal history of transient ischemic attack (TIA), and cerebral infarction without residual deficits: Secondary | ICD-10-CM | POA: Diagnosis not present

## 2013-06-12 DIAGNOSIS — Z79899 Other long term (current) drug therapy: Secondary | ICD-10-CM | POA: Diagnosis not present

## 2013-06-12 DIAGNOSIS — IMO0002 Reserved for concepts with insufficient information to code with codable children: Secondary | ICD-10-CM | POA: Diagnosis not present

## 2013-06-12 DIAGNOSIS — I059 Rheumatic mitral valve disease, unspecified: Secondary | ICD-10-CM | POA: Diagnosis not present

## 2013-06-12 DIAGNOSIS — I5022 Chronic systolic (congestive) heart failure: Secondary | ICD-10-CM | POA: Diagnosis not present

## 2013-06-12 DIAGNOSIS — Z9861 Coronary angioplasty status: Secondary | ICD-10-CM | POA: Diagnosis not present

## 2013-06-12 DIAGNOSIS — R310 Gross hematuria: Secondary | ICD-10-CM | POA: Diagnosis not present

## 2013-06-12 DIAGNOSIS — I252 Old myocardial infarction: Secondary | ICD-10-CM | POA: Diagnosis not present

## 2013-06-12 DIAGNOSIS — Z951 Presence of aortocoronary bypass graft: Secondary | ICD-10-CM | POA: Diagnosis not present

## 2013-06-15 ENCOUNTER — Ambulatory Visit (INDEPENDENT_AMBULATORY_CARE_PROVIDER_SITE_OTHER): Payer: Medicare Other | Admitting: Pharmacist

## 2013-06-15 DIAGNOSIS — Z7901 Long term (current) use of anticoagulants: Secondary | ICD-10-CM

## 2013-06-15 DIAGNOSIS — I4891 Unspecified atrial fibrillation: Secondary | ICD-10-CM

## 2013-06-15 DIAGNOSIS — Z5181 Encounter for therapeutic drug level monitoring: Secondary | ICD-10-CM | POA: Diagnosis not present

## 2013-06-15 LAB — POCT INR: INR: 2.1

## 2013-06-19 DIAGNOSIS — I059 Rheumatic mitral valve disease, unspecified: Secondary | ICD-10-CM | POA: Diagnosis not present

## 2013-06-19 DIAGNOSIS — S3720XA Unspecified injury of bladder, initial encounter: Secondary | ICD-10-CM | POA: Diagnosis not present

## 2013-06-19 DIAGNOSIS — I5022 Chronic systolic (congestive) heart failure: Secondary | ICD-10-CM | POA: Diagnosis not present

## 2013-06-19 DIAGNOSIS — S3730XA Unspecified injury of urethra, initial encounter: Secondary | ICD-10-CM | POA: Diagnosis not present

## 2013-06-19 DIAGNOSIS — Z79899 Other long term (current) drug therapy: Secondary | ICD-10-CM | POA: Diagnosis not present

## 2013-06-19 DIAGNOSIS — R310 Gross hematuria: Secondary | ICD-10-CM | POA: Diagnosis not present

## 2013-06-19 DIAGNOSIS — Z7901 Long term (current) use of anticoagulants: Secondary | ICD-10-CM | POA: Diagnosis not present

## 2013-06-19 DIAGNOSIS — I4892 Unspecified atrial flutter: Secondary | ICD-10-CM | POA: Diagnosis not present

## 2013-06-20 DIAGNOSIS — I359 Nonrheumatic aortic valve disorder, unspecified: Secondary | ICD-10-CM | POA: Diagnosis not present

## 2013-06-20 DIAGNOSIS — Z79899 Other long term (current) drug therapy: Secondary | ICD-10-CM | POA: Diagnosis not present

## 2013-06-20 DIAGNOSIS — Z7901 Long term (current) use of anticoagulants: Secondary | ICD-10-CM | POA: Diagnosis not present

## 2013-06-20 DIAGNOSIS — S3720XA Unspecified injury of bladder, initial encounter: Secondary | ICD-10-CM | POA: Diagnosis not present

## 2013-06-20 DIAGNOSIS — I369 Nonrheumatic tricuspid valve disorder, unspecified: Secondary | ICD-10-CM | POA: Diagnosis not present

## 2013-06-20 DIAGNOSIS — I2789 Other specified pulmonary heart diseases: Secondary | ICD-10-CM | POA: Diagnosis not present

## 2013-06-20 DIAGNOSIS — I059 Rheumatic mitral valve disease, unspecified: Secondary | ICD-10-CM | POA: Diagnosis not present

## 2013-06-20 DIAGNOSIS — Z452 Encounter for adjustment and management of vascular access device: Secondary | ICD-10-CM | POA: Diagnosis not present

## 2013-06-20 DIAGNOSIS — I4892 Unspecified atrial flutter: Secondary | ICD-10-CM | POA: Diagnosis not present

## 2013-06-20 DIAGNOSIS — I5022 Chronic systolic (congestive) heart failure: Secondary | ICD-10-CM | POA: Diagnosis not present

## 2013-06-20 DIAGNOSIS — R310 Gross hematuria: Secondary | ICD-10-CM | POA: Diagnosis not present

## 2013-06-21 DIAGNOSIS — I4891 Unspecified atrial fibrillation: Secondary | ICD-10-CM | POA: Diagnosis not present

## 2013-06-22 ENCOUNTER — Ambulatory Visit (INDEPENDENT_AMBULATORY_CARE_PROVIDER_SITE_OTHER): Payer: Medicare Other

## 2013-06-22 DIAGNOSIS — Z5181 Encounter for therapeutic drug level monitoring: Secondary | ICD-10-CM | POA: Diagnosis not present

## 2013-06-22 DIAGNOSIS — I4891 Unspecified atrial fibrillation: Secondary | ICD-10-CM | POA: Diagnosis not present

## 2013-06-22 DIAGNOSIS — Z7901 Long term (current) use of anticoagulants: Secondary | ICD-10-CM | POA: Diagnosis not present

## 2013-06-22 LAB — POCT INR: INR: 1.4

## 2013-06-29 ENCOUNTER — Ambulatory Visit (INDEPENDENT_AMBULATORY_CARE_PROVIDER_SITE_OTHER): Payer: Medicare Other | Admitting: Pharmacist

## 2013-06-29 DIAGNOSIS — Z5181 Encounter for therapeutic drug level monitoring: Secondary | ICD-10-CM

## 2013-06-29 DIAGNOSIS — I4891 Unspecified atrial fibrillation: Secondary | ICD-10-CM | POA: Diagnosis not present

## 2013-06-29 DIAGNOSIS — Z7901 Long term (current) use of anticoagulants: Secondary | ICD-10-CM

## 2013-06-29 LAB — POCT INR: INR: 1.6

## 2013-07-07 ENCOUNTER — Ambulatory Visit (INDEPENDENT_AMBULATORY_CARE_PROVIDER_SITE_OTHER): Payer: Medicare Other | Admitting: Pharmacist

## 2013-07-07 ENCOUNTER — Encounter (INDEPENDENT_AMBULATORY_CARE_PROVIDER_SITE_OTHER): Payer: Self-pay

## 2013-07-07 DIAGNOSIS — I4891 Unspecified atrial fibrillation: Secondary | ICD-10-CM

## 2013-07-07 DIAGNOSIS — Z5181 Encounter for therapeutic drug level monitoring: Secondary | ICD-10-CM | POA: Diagnosis not present

## 2013-07-07 DIAGNOSIS — Z7901 Long term (current) use of anticoagulants: Secondary | ICD-10-CM | POA: Diagnosis not present

## 2013-07-07 LAB — POCT INR: INR: 2

## 2013-07-12 DIAGNOSIS — I5022 Chronic systolic (congestive) heart failure: Secondary | ICD-10-CM | POA: Diagnosis not present

## 2013-07-12 DIAGNOSIS — Z9889 Other specified postprocedural states: Secondary | ICD-10-CM | POA: Diagnosis not present

## 2013-07-12 DIAGNOSIS — I4891 Unspecified atrial fibrillation: Secondary | ICD-10-CM | POA: Diagnosis not present

## 2013-07-12 DIAGNOSIS — I059 Rheumatic mitral valve disease, unspecified: Secondary | ICD-10-CM | POA: Diagnosis not present

## 2013-07-12 DIAGNOSIS — I509 Heart failure, unspecified: Secondary | ICD-10-CM | POA: Diagnosis not present

## 2013-07-12 DIAGNOSIS — I2510 Atherosclerotic heart disease of native coronary artery without angina pectoris: Secondary | ICD-10-CM | POA: Diagnosis not present

## 2013-07-13 ENCOUNTER — Other Ambulatory Visit: Payer: Self-pay

## 2013-07-13 MED ORDER — POTASSIUM CHLORIDE ER 20 MEQ PO TBCR: 20.0000 meq | EXTENDED_RELEASE_TABLET | Freq: Two times a day (BID) | ORAL | Status: DC

## 2013-07-14 ENCOUNTER — Other Ambulatory Visit: Payer: Self-pay

## 2013-07-14 MED ORDER — POTASSIUM CHLORIDE ER 20 MEQ PO TBCR: 20.0000 meq | EXTENDED_RELEASE_TABLET | Freq: Two times a day (BID) | ORAL | Status: DC

## 2013-07-21 ENCOUNTER — Ambulatory Visit (INDEPENDENT_AMBULATORY_CARE_PROVIDER_SITE_OTHER): Payer: Medicare Other | Admitting: Pharmacist

## 2013-07-21 DIAGNOSIS — Z5181 Encounter for therapeutic drug level monitoring: Secondary | ICD-10-CM

## 2013-07-21 DIAGNOSIS — I4891 Unspecified atrial fibrillation: Secondary | ICD-10-CM | POA: Diagnosis not present

## 2013-07-21 DIAGNOSIS — Z7901 Long term (current) use of anticoagulants: Secondary | ICD-10-CM | POA: Diagnosis not present

## 2013-07-21 LAB — POCT INR: INR: 2.2

## 2013-07-24 DIAGNOSIS — I491 Atrial premature depolarization: Secondary | ICD-10-CM | POA: Diagnosis not present

## 2013-07-24 DIAGNOSIS — I4949 Other premature depolarization: Secondary | ICD-10-CM | POA: Diagnosis not present

## 2013-07-24 DIAGNOSIS — I472 Ventricular tachycardia: Secondary | ICD-10-CM | POA: Diagnosis not present

## 2013-07-24 DIAGNOSIS — I471 Supraventricular tachycardia: Secondary | ICD-10-CM | POA: Diagnosis not present

## 2013-07-24 DIAGNOSIS — I4729 Other ventricular tachycardia: Secondary | ICD-10-CM | POA: Diagnosis not present

## 2013-07-27 ENCOUNTER — Telehealth: Payer: Self-pay

## 2013-07-27 DIAGNOSIS — I4892 Unspecified atrial flutter: Secondary | ICD-10-CM | POA: Diagnosis not present

## 2013-07-27 NOTE — Telephone Encounter (Signed)
[facility] outpatient pharm called to change patient from generic to brand name coumadin, the patient was having an reacting to the generic brand. I gave them permission to change back to name brand

## 2013-08-01 DIAGNOSIS — I5022 Chronic systolic (congestive) heart failure: Secondary | ICD-10-CM | POA: Diagnosis not present

## 2013-08-01 DIAGNOSIS — H35369 Drusen (degenerative) of macula, unspecified eye: Secondary | ICD-10-CM | POA: Diagnosis not present

## 2013-08-01 DIAGNOSIS — I509 Heart failure, unspecified: Secondary | ICD-10-CM | POA: Diagnosis not present

## 2013-08-01 DIAGNOSIS — H353190 Nonexudative age-related macular degeneration, unspecified eye, stage unspecified: Secondary | ICD-10-CM | POA: Diagnosis not present

## 2013-08-06 DIAGNOSIS — H353190 Nonexudative age-related macular degeneration, unspecified eye, stage unspecified: Secondary | ICD-10-CM | POA: Insufficient documentation

## 2013-08-06 DIAGNOSIS — H35369 Drusen (degenerative) of macula, unspecified eye: Secondary | ICD-10-CM | POA: Insufficient documentation

## 2013-08-11 ENCOUNTER — Ambulatory Visit (INDEPENDENT_AMBULATORY_CARE_PROVIDER_SITE_OTHER): Payer: Medicare Other

## 2013-08-11 DIAGNOSIS — I4891 Unspecified atrial fibrillation: Secondary | ICD-10-CM

## 2013-08-11 DIAGNOSIS — Z5181 Encounter for therapeutic drug level monitoring: Secondary | ICD-10-CM | POA: Diagnosis not present

## 2013-08-11 DIAGNOSIS — Z7901 Long term (current) use of anticoagulants: Secondary | ICD-10-CM

## 2013-08-11 LAB — POCT INR: INR: 1.7

## 2013-08-25 ENCOUNTER — Ambulatory Visit (INDEPENDENT_AMBULATORY_CARE_PROVIDER_SITE_OTHER): Payer: Medicare Other

## 2013-08-25 DIAGNOSIS — Z5181 Encounter for therapeutic drug level monitoring: Secondary | ICD-10-CM

## 2013-08-25 DIAGNOSIS — Z7901 Long term (current) use of anticoagulants: Secondary | ICD-10-CM | POA: Diagnosis not present

## 2013-08-25 DIAGNOSIS — I4891 Unspecified atrial fibrillation: Secondary | ICD-10-CM | POA: Diagnosis not present

## 2013-08-25 LAB — POCT INR: INR: 1.6

## 2013-09-01 ENCOUNTER — Other Ambulatory Visit: Payer: Self-pay | Admitting: *Deleted

## 2013-09-01 MED ORDER — VALSARTAN 40 MG PO TABS: 40.0000 mg | ORAL_TABLET | Freq: Every day | ORAL | Status: DC

## 2013-09-04 DIAGNOSIS — I059 Rheumatic mitral valve disease, unspecified: Secondary | ICD-10-CM | POA: Diagnosis not present

## 2013-09-04 DIAGNOSIS — I2510 Atherosclerotic heart disease of native coronary artery without angina pectoris: Secondary | ICD-10-CM | POA: Diagnosis not present

## 2013-09-04 DIAGNOSIS — I4891 Unspecified atrial fibrillation: Secondary | ICD-10-CM | POA: Diagnosis not present

## 2013-09-04 DIAGNOSIS — Z9889 Other specified postprocedural states: Secondary | ICD-10-CM | POA: Diagnosis not present

## 2013-09-08 ENCOUNTER — Ambulatory Visit (INDEPENDENT_AMBULATORY_CARE_PROVIDER_SITE_OTHER): Payer: Medicare Other | Admitting: Pharmacist

## 2013-09-08 DIAGNOSIS — Z7901 Long term (current) use of anticoagulants: Secondary | ICD-10-CM | POA: Diagnosis not present

## 2013-09-08 DIAGNOSIS — Z5181 Encounter for therapeutic drug level monitoring: Secondary | ICD-10-CM | POA: Diagnosis not present

## 2013-09-08 DIAGNOSIS — I4891 Unspecified atrial fibrillation: Secondary | ICD-10-CM

## 2013-09-08 LAB — POCT INR: INR: 2.1

## 2013-09-11 ENCOUNTER — Encounter: Payer: Self-pay | Admitting: Internal Medicine

## 2013-09-11 ENCOUNTER — Ambulatory Visit (INDEPENDENT_AMBULATORY_CARE_PROVIDER_SITE_OTHER): Payer: Medicare Other | Admitting: Internal Medicine

## 2013-09-11 VITALS — BP 151/72 | HR 89 | Ht 70.0 in | Wt 156.0 lb

## 2013-09-11 DIAGNOSIS — I2589 Other forms of chronic ischemic heart disease: Secondary | ICD-10-CM | POA: Diagnosis not present

## 2013-09-11 DIAGNOSIS — I5022 Chronic systolic (congestive) heart failure: Secondary | ICD-10-CM | POA: Diagnosis not present

## 2013-09-11 DIAGNOSIS — I4891 Unspecified atrial fibrillation: Secondary | ICD-10-CM | POA: Diagnosis not present

## 2013-09-11 DIAGNOSIS — I255 Ischemic cardiomyopathy: Secondary | ICD-10-CM

## 2013-09-11 NOTE — Progress Notes (Signed)
Patient Care Team: Dorena Cookey, MD as PCP - General Elsie Stain, MD (Pulmonary Disease)   HPI  Caleb [name] is a 73 y.o. male Seen in followup for atrial arrhythmias in the context of long-standing atrial fibrillation. He has a history of ischemic heart disease dating back to 2004 when he presented with an anterior wall MI. He had progressive mitral regurgitation and a dilated aortic root and he went to [place] underwent mitral valve repair aortic root surgery and a CryoMaze He also had a single vessel bypass with a LIMA to his LAD Echocardiogram 1/15 it demonstrated ejection fraction 20-25% with moderate AR mild MR with a prosthestic ring.    he was seen by Dr. Ola Spurr for treatment of recurrent postoperative atrial arrhythmia  he continues to have palpitations. A ZIO patch failed to be illuminating  He has peripheral edema left greater than right. He wonders whether this is cardiac.  A stress test demonstrating chronotropic incompetence with a maximal heart rate of 70.  Past Medical History  Diagnosis Date  . CAD (coronary artery disease)   . Atrial fibrillation   . Cardiomyopathy, ischemic   . Heart failure, systolic, acute on chronic   . Hypertension   . Hyperlipidemia   . Pleural effusion   . Positional vertigo   . Dizziness   . GERD (gastroesophageal reflux disease)   . Atypical pneumonia   . Cough   . Allergic rhinitis   . TIA (transient ischemic attack)   . OSA (obstructive sleep apnea)     Home sleep test 07/05/2009 AHI 8.2  . Chronic anticoagulation     Past Surgical History  Procedure Laterality Date  . Coronary stent placement  2004    LAD  . Shoulder surgery    . Tee without cardioversion N/A 09/21/2012    Procedure: TRANSESOPHAGEAL ECHOCARDIOGRAM (TEE);  Surgeon: Larey Dresser, MD;  Location: [facility] ENDOSCOPY;  Service: Cardiovascular;  Laterality: N/A;  . Coronary artery bypass graft  01/09/13    LAD LIMA, left atrial appendage    . Mitral valve annuloplasty  01/09/13  . Aortic valve repair  01/09/13    Current Outpatient Prescriptions  Medication Sig Dispense Refill  . Ascorbic Acid (VITAMIN C) 500 MG CAPS Take 2 capsules by mouth daily.      Marland Kitchen aspirin 81 MG tablet Take 81 mg by mouth daily.      . furosemide (LASIX) 40 MG tablet Take 80 mg one day, take 40 mg next day - keep alternating this pattern.  45 tablet  3  . Magnesium 100 MG CAPS Take 1 capsule by mouth 2 (two) times daily.       . Magnesium Hydroxide (MILK OF MAGNESIA PO) Take by mouth as needed.      . nitroGLYCERIN (NITROSTAT) 0.4 MG SL tablet Place 0.4 mg under the tongue every 5 (five) minutes as needed. May repeat for up to 3 doses.       . Omega-3 Fatty Acids (THE VERY FINEST FISH OIL) LIQD Take 2-3 g by mouth daily.       . Potassium Chloride ER 20 MEQ TBCR Take 20 mEq by mouth 2 (two) times daily.  60 tablet  3  . valsartan (DIOVAN) 40 MG tablet Take 1 tablet (40 mg total) by mouth daily.  30 tablet  0  . warfarin (COUMADIN) 5 MG tablet Take as directed by Anticoagulation clinic  30 tablet  3  .  Zinc 100 MG TABS Take by mouth daily.      . [DISCONTINUED] eplerenone (INSPRA) 25 MG tablet Take 1 tablet (25 mg total) by mouth daily.  30 tablet  11   No current facility-administered medications for this visit.    Allergies  Allergen Reactions  . Carvedilol     Dizziness, uneasiness, alopecia  . Sulfonamide Derivatives     REACTION: rash  . Pradaxa [Dabigatran Etexilate Mesylate] Rash  . Xarelto [Rivaroxaban] Rash    Review of Systems negative except from HPI and PMH  Physical Exam BP 151/72  Pulse 89  Ht _0  (1.778 m)  Wt 156 lb (70.761 kg)  BMI 22.38 kg/m2 Well developed and well nourished in no acute distress HENT normal E scleral and icterus clear Neck Supple JVP flat; carotids brisk and full Clear to ausculation  Regular rate and rhythm, no murmurs gallops or rub Soft with active bowel sounds No clubbing cyanosis left  1+ right trace edema Edema Alert and oriented, grossly normal motor and sensory function Skin Warm and Dry   ECG demonstratedNSR    Assessment and  Plan  Palpitations  MV repair Cryo Maze  RFCA   Edema  Chronic systolic heart failure  Ischemic/nonischemic cardiomyopathy with prior bypass   He is undergoing catheter ablation following his MAZE . He continues to have complaints of palpitations and exercise intolerance. He used a 1-lead ZIO patch. I wonder whether he is really recorder might be helpful.  He does not take beta blockers because of alopecia  does not take Aldactone because of cognitive defects. The former is a class effect the latter may not be. He may well be a candidate for eplerenone.  He is to see Dr. Ola Spurr in a couple of months. A repeat echo is pending. He make a decision at that time as the recommendation regarding ICD implantation  We'll increase his Lasix from 40--80 mg daily for 3 days.  He may well be a candidate for venous Dopplers to assess her venous incompetence

## 2013-09-11 NOTE — Patient Instructions (Signed)
Your physician recommends that you continue on your current medications as directed. Please refer to the Current Medication list given to you today.  Your physician wants you to follow-up in: 6 months with [name]. You will receive a reminder letter in the mail two months in advance. If you don't receive a letter, please call our office to schedule the follow-up appointment.

## 2013-09-21 ENCOUNTER — Telehealth: Payer: Self-pay

## 2013-09-21 MED ORDER — WARFARIN SODIUM 5 MG PO TABS: ORAL_TABLET | ORAL | Status: DC

## 2013-09-21 NOTE — Telephone Encounter (Signed)
Rx sent for pt

## 2013-09-29 ENCOUNTER — Ambulatory Visit (INDEPENDENT_AMBULATORY_CARE_PROVIDER_SITE_OTHER): Payer: Medicare Other

## 2013-09-29 DIAGNOSIS — Z7901 Long term (current) use of anticoagulants: Secondary | ICD-10-CM

## 2013-09-29 DIAGNOSIS — Z5181 Encounter for therapeutic drug level monitoring: Secondary | ICD-10-CM | POA: Diagnosis not present

## 2013-09-29 DIAGNOSIS — I4891 Unspecified atrial fibrillation: Secondary | ICD-10-CM

## 2013-09-29 LAB — POCT INR: INR: 2.1

## 2013-10-03 ENCOUNTER — Other Ambulatory Visit: Payer: Self-pay | Admitting: Internal Medicine

## 2013-10-27 ENCOUNTER — Ambulatory Visit (INDEPENDENT_AMBULATORY_CARE_PROVIDER_SITE_OTHER): Payer: Medicare Other | Admitting: *Deleted

## 2013-10-27 DIAGNOSIS — Z7901 Long term (current) use of anticoagulants: Secondary | ICD-10-CM

## 2013-10-27 DIAGNOSIS — Z5181 Encounter for therapeutic drug level monitoring: Secondary | ICD-10-CM | POA: Diagnosis not present

## 2013-10-27 DIAGNOSIS — I4891 Unspecified atrial fibrillation: Secondary | ICD-10-CM | POA: Diagnosis not present

## 2013-10-27 LAB — POCT INR: INR: 1.9

## 2013-10-31 ENCOUNTER — Ambulatory Visit (INDEPENDENT_AMBULATORY_CARE_PROVIDER_SITE_OTHER): Payer: Medicare Other | Admitting: Pharmacist Clinician (PhC)/ Clinical Pharmacy Specialist

## 2013-10-31 DIAGNOSIS — Z7901 Long term (current) use of anticoagulants: Secondary | ICD-10-CM | POA: Diagnosis not present

## 2013-10-31 DIAGNOSIS — I4891 Unspecified atrial fibrillation: Secondary | ICD-10-CM | POA: Diagnosis not present

## 2013-10-31 DIAGNOSIS — Z5181 Encounter for therapeutic drug level monitoring: Secondary | ICD-10-CM | POA: Diagnosis not present

## 2013-10-31 LAB — POCT INR: INR: 2

## 2013-11-01 ENCOUNTER — Other Ambulatory Visit (INDEPENDENT_AMBULATORY_CARE_PROVIDER_SITE_OTHER): Payer: Medicare Other

## 2013-11-01 ENCOUNTER — Other Ambulatory Visit: Payer: Self-pay | Admitting: Family Medicine

## 2013-11-01 DIAGNOSIS — R319 Hematuria, unspecified: Secondary | ICD-10-CM | POA: Diagnosis not present

## 2013-11-01 LAB — CBC WITH DIFFERENTIAL/PLATELET
BASOS ABS: 0 10*3/uL (ref 0.0–0.1)
Basophils Relative: 0.5 % (ref 0.0–3.0)
EOS ABS: 0.3 10*3/uL (ref 0.0–0.7)
Eosinophils Relative: 7 % — ABNORMAL HIGH (ref 0.0–5.0)
HCT: 42.1 % (ref 39.0–52.0)
Hemoglobin: 14.1 g/dL (ref 13.0–17.0)
LYMPHS PCT: 27.8 % (ref 12.0–46.0)
Lymphs Abs: 1.2 10*3/uL (ref 0.7–4.0)
MCHC: 33.5 g/dL (ref 30.0–36.0)
MCV: 89 fl (ref 78.0–100.0)
MONOS PCT: 10.5 % (ref 3.0–12.0)
Monocytes Absolute: 0.5 10*3/uL (ref 0.1–1.0)
NEUTROS ABS: 2.4 10*3/uL (ref 1.4–7.7)
Neutrophils Relative %: 54.2 % (ref 43.0–77.0)
PLATELETS: 190 10*3/uL (ref 150.0–400.0)
RBC: 4.73 Mil/uL (ref 4.22–5.81)
RDW: 16.1 % — AB (ref 11.5–15.5)
WBC: 4.4 10*3/uL (ref 4.0–10.5)

## 2013-11-01 LAB — POCT URINALYSIS DIPSTICK
Bilirubin, UA: NEGATIVE
Glucose, UA: NEGATIVE
Ketones, UA: NEGATIVE
LEUKOCYTES UA: NEGATIVE
NITRITE UA: NEGATIVE
PH UA: 7
Spec Grav, UA: 1.015
UROBILINOGEN UA: 0.2

## 2013-11-01 LAB — BASIC METABOLIC PANEL
BUN: 17 mg/dL (ref 6–23)
CALCIUM: 9.3 mg/dL (ref 8.4–10.5)
CHLORIDE: 100 meq/L (ref 96–112)
CO2: 28 mEq/L (ref 19–32)
CREATININE: 0.9 mg/dL (ref 0.4–1.5)
GFR: 89.72 mL/min (ref >60.00)
Glucose, Bld: 90 mg/dL (ref 70–99)
Potassium: 4.6 mEq/L (ref 3.5–5.1)
Sodium: 135 mEq/L (ref 135–145)

## 2013-11-02 ENCOUNTER — Telehealth: Payer: Self-pay | Admitting: *Deleted

## 2013-11-02 NOTE — Telephone Encounter (Signed)
Patient called to report he was having hematuria for the past week, and after seeing Dr. Sherren Mocha was told to hold aspirin for 3-5 days.  Patient is on warfarin for h/o AFib, and also on aspirin given h/o CAD / MI.  He is on omega 3 FA as well, which has a small antiplatelet effect to it as well.  Since holding aspirin for the past 2 days his hematuria has resolved.  His CBC this week was normal.  Patient is nervous about being off aspirin, and wants to know what he should do.  INR was 2.0 earlier this week.    Patient notified to continue warfarin, and to remain off aspirin for 2 more days.  He is also to reduce his fish oil from 3 g/d down to 1 g/d, and may be able to d/c this in the future.  He will call back if hematuria restarts.

## 2013-11-03 ENCOUNTER — Other Ambulatory Visit: Payer: Self-pay | Admitting: Family Medicine

## 2013-11-03 DIAGNOSIS — D649 Anemia, unspecified: Secondary | ICD-10-CM

## 2013-11-17 ENCOUNTER — Other Ambulatory Visit: Payer: Self-pay | Admitting: Internal Medicine

## 2013-11-20 DIAGNOSIS — I5022 Chronic systolic (congestive) heart failure: Secondary | ICD-10-CM | POA: Diagnosis not present

## 2013-11-20 DIAGNOSIS — I509 Heart failure, unspecified: Secondary | ICD-10-CM | POA: Diagnosis not present

## 2013-11-28 DIAGNOSIS — I472 Ventricular tachycardia: Secondary | ICD-10-CM | POA: Diagnosis not present

## 2013-11-28 DIAGNOSIS — I471 Supraventricular tachycardia: Secondary | ICD-10-CM | POA: Diagnosis not present

## 2013-11-28 DIAGNOSIS — I4891 Unspecified atrial fibrillation: Secondary | ICD-10-CM | POA: Diagnosis not present

## 2013-11-28 DIAGNOSIS — I4729 Other ventricular tachycardia: Secondary | ICD-10-CM | POA: Diagnosis not present

## 2013-12-05 DIAGNOSIS — I4891 Unspecified atrial fibrillation: Secondary | ICD-10-CM | POA: Diagnosis not present

## 2013-12-11 DIAGNOSIS — I059 Rheumatic mitral valve disease, unspecified: Secondary | ICD-10-CM | POA: Diagnosis not present

## 2013-12-11 DIAGNOSIS — Z9889 Other specified postprocedural states: Secondary | ICD-10-CM | POA: Diagnosis not present

## 2013-12-11 DIAGNOSIS — Z8679 Personal history of other diseases of the circulatory system: Secondary | ICD-10-CM | POA: Diagnosis not present

## 2013-12-18 ENCOUNTER — Ambulatory Visit (INDEPENDENT_AMBULATORY_CARE_PROVIDER_SITE_OTHER): Payer: Medicare Other | Admitting: Surgery

## 2013-12-18 ENCOUNTER — Ambulatory Visit (INDEPENDENT_AMBULATORY_CARE_PROVIDER_SITE_OTHER): Payer: Medicare Other | Admitting: Cardiovascular Disease

## 2013-12-18 ENCOUNTER — Encounter: Payer: Self-pay | Admitting: Cardiovascular Disease

## 2013-12-18 VITALS — BP 134/68 | HR 79 | Ht 70.0 in | Wt 157.0 lb

## 2013-12-18 DIAGNOSIS — I2589 Other forms of chronic ischemic heart disease: Secondary | ICD-10-CM

## 2013-12-18 DIAGNOSIS — I509 Heart failure, unspecified: Secondary | ICD-10-CM

## 2013-12-18 DIAGNOSIS — Z5181 Encounter for therapeutic drug level monitoring: Secondary | ICD-10-CM

## 2013-12-18 DIAGNOSIS — I5022 Chronic systolic (congestive) heart failure: Secondary | ICD-10-CM | POA: Diagnosis not present

## 2013-12-18 DIAGNOSIS — I6523 Occlusion and stenosis of bilateral carotid arteries: Secondary | ICD-10-CM

## 2013-12-18 DIAGNOSIS — I658 Occlusion and stenosis of other precerebral arteries: Secondary | ICD-10-CM | POA: Diagnosis not present

## 2013-12-18 DIAGNOSIS — I6529 Occlusion and stenosis of unspecified carotid artery: Secondary | ICD-10-CM | POA: Diagnosis not present

## 2013-12-18 DIAGNOSIS — I4891 Unspecified atrial fibrillation: Secondary | ICD-10-CM

## 2013-12-18 LAB — POCT INR: INR: 1.6

## 2013-12-18 NOTE — Patient Instructions (Signed)
Your physician has requested that you have an echocardiogram in January 2016. Echocardiography is a painless test that uses sound waves to create images of your heart. It provides your doctor with information about the size and shape of your heart and how well your heart's chambers and valves are working. This procedure takes approximately one hour. There are no restrictions for this procedure.  Your physician has requested that you have a carotid duplex in January 2016. This test is an ultrasound of the carotid arteries in your neck. It looks at blood flow through these arteries that supply the brain with blood. Allow one hour for this exam. There are no restrictions or special instructions.  Your physician recommends that you continue on your current medications as directed. Please refer to the Current Medication list given to you today.  Your physician wants you to follow-up in: 6 MONTHS with Dr Burt Knack.  You will receive a reminder letter in the mail two months in advance. If you don't receive a letter, please call our office to schedule the follow-up appointment.

## 2013-12-20 ENCOUNTER — Encounter: Payer: Self-pay | Admitting: Cardiovascular Disease

## 2013-12-20 NOTE — Progress Notes (Signed)
HPI:  Caleb [name] returns for follow-up cardiac evaluation. He has a history of ischemic cardiomyopathy dating back to 2004 when he presented with an anterior wall MI. He's also had atrial fibrillation, mitral regurgitation, and a dilated aortic root. He has residual CAD with severe stenosis in the LAD which was his infarct vessel back in 2004. He  underwent mitral valve repair, aortic root surgery, Cryo-Cox Maze procedure, and single-vessel bypass with the LIMA to LAD approximately one year ago (01/09/2013). He has been followed closely by both [name] and Dr. Ola Spurr for recurrent postoperative atrial arrhythmias and he has undergone subsequent RF ablation. He's also been found to have chronotropic incompetence with a maximum heart rate of 70. The patient has severe LV dysfunction postoperatively with no significant improvement, LVEF 20-25%.  From a symptomatic perspective, he is doing fairly well. He denies chest pain or pressure. He has mild exertional dyspnea, unchanged over time. He does complain of fatigue. He denies orthopnea or PND. Continues to have episodes of palpitations.   Outpatient Encounter Prescriptions as of 12/18/2013  Medication Sig  . Ascorbic Acid (VITAMIN C) 500 MG CAPS Take 2 capsules by mouth daily.  Marland Kitchen aspirin 81 MG tablet Take 81 mg by mouth daily.  . furosemide (LASIX) 40 MG tablet TAKE 2 TABLETS BY MOUTH 1 DAY AND 1 TABLET THE NEXT DAY (KEEP ALTERNATING THIS PATTERN)  . Magnesium 100 MG CAPS Take 1 capsule by mouth 2 (two) times daily.   . Magnesium Hydroxide (MILK OF MAGNESIA PO) Take by mouth as needed.  . nitroGLYCERIN (NITROSTAT) 0.4 MG SL tablet Place 0.4 mg under the tongue every 5 (five) minutes as needed. May repeat for up to 3 doses.   . Omega-3 Fatty Acids (THE VERY FINEST FISH OIL) LIQD Take 2-3 g by mouth daily.   . Potassium Chloride ER 20 MEQ TBCR Take 20 mEq by mouth 2 (two) times daily.  . valsartan (DIOVAN) 40 MG tablet TAKE 1 TABLET BY MOUTH  ONCE DAILY  . warfarin (COUMADIN) 5 MG tablet Take as directed by Anticoagulation clinic  . Zinc 100 MG TABS Take by mouth daily.    Allergies  Allergen Reactions  . Carvedilol     Dizziness, uneasiness, alopecia  . Sulfonamide Derivatives     REACTION: rash  . Pradaxa [Dabigatran Etexilate Mesylate] Rash  . Xarelto [Rivaroxaban] Rash    Past Medical History  Diagnosis Date  . CAD (coronary artery disease)   . Atrial fibrillation   . Cardiomyopathy, ischemic   . Heart failure, systolic, acute on chronic   . Hypertension   . Hyperlipidemia   . Pleural effusion   . Positional vertigo   . Dizziness   . GERD (gastroesophageal reflux disease)   . Atypical pneumonia   . Cough   . Allergic rhinitis   . TIA (transient ischemic attack)   . OSA (obstructive sleep apnea)     Home sleep test 07/05/2009 AHI 8.2  . Chronic anticoagulation     ROS: Negative except as per HPI  BP 134/68  Pulse 79  Ht _0  (1.778 m)  Wt 157 lb (71.215 kg)  BMI 22.53 kg/m2  PHYSICAL EXAM: Pt is alert and oriented, NAD HEENT: normal Neck: JVP - normal, carotids 2+= without bruits Lungs: CTA bilaterally CV: RRR without murmur or gallop Abd: soft, NT, Positive BS, no hepatomegaly Ext: no C/C/E, distal pulses intact and equal Skin: warm/dry no rash  ASSESSMENT AND PLAN: 1. CAD with severe  ischemic cardiomyopathy, no anginal symptoms. He has been intolerant to multiple beta blockers. He continues on low-dose valsartan. Recommend repeat echocardiogram when he is out to one year from surgery.  2. Chronic systolic heart failure. As above, no evidence of volume overload on exam. Multiple drug intolerances limit therapy. ICD has been discussed with the patient.  3. Atrial fibrillation. He is followed closely by [name] and Dr. Ola Spurr. He maintains anticoagulation with warfarin.  4. Carotid stenosis without history of TIA or stroke. The patient has 40-59% ICA stenosis. Will arrange carotid  duplex when he comes in for his echocardiogram in January.  I will see him back in followup in 6 months.  Sherren Mocha MD 12/20/2013 11:05 PM

## 2014-01-01 ENCOUNTER — Ambulatory Visit (INDEPENDENT_AMBULATORY_CARE_PROVIDER_SITE_OTHER): Payer: Medicare Other | Admitting: *Deleted

## 2014-01-01 DIAGNOSIS — Z7901 Long term (current) use of anticoagulants: Secondary | ICD-10-CM

## 2014-01-01 DIAGNOSIS — I4891 Unspecified atrial fibrillation: Secondary | ICD-10-CM

## 2014-01-01 DIAGNOSIS — Z5181 Encounter for therapeutic drug level monitoring: Secondary | ICD-10-CM | POA: Diagnosis not present

## 2014-01-01 LAB — POCT INR: INR: 1.9

## 2014-01-09 ENCOUNTER — Ambulatory Visit (INDEPENDENT_AMBULATORY_CARE_PROVIDER_SITE_OTHER): Payer: Medicare Other

## 2014-01-09 DIAGNOSIS — Z23 Encounter for immunization: Secondary | ICD-10-CM

## 2014-01-22 ENCOUNTER — Ambulatory Visit (INDEPENDENT_AMBULATORY_CARE_PROVIDER_SITE_OTHER): Payer: Medicare Other | Admitting: Pharmacist

## 2014-01-22 DIAGNOSIS — Z7901 Long term (current) use of anticoagulants: Secondary | ICD-10-CM

## 2014-01-22 DIAGNOSIS — Z5181 Encounter for therapeutic drug level monitoring: Secondary | ICD-10-CM

## 2014-01-22 DIAGNOSIS — I4891 Unspecified atrial fibrillation: Secondary | ICD-10-CM | POA: Diagnosis not present

## 2014-01-22 LAB — POCT INR: INR: 1.9

## 2014-02-07 ENCOUNTER — Ambulatory Visit (INDEPENDENT_AMBULATORY_CARE_PROVIDER_SITE_OTHER): Payer: Medicare Other | Admitting: Family Medicine

## 2014-02-07 ENCOUNTER — Encounter: Payer: Self-pay | Admitting: Family Medicine

## 2014-02-07 VITALS — BP 120/68 | Temp 98.2°F | Wt 158.0 lb

## 2014-02-07 DIAGNOSIS — R310 Gross hematuria: Secondary | ICD-10-CM | POA: Diagnosis not present

## 2014-02-07 DIAGNOSIS — R319 Hematuria, unspecified: Secondary | ICD-10-CM | POA: Diagnosis not present

## 2014-02-07 DIAGNOSIS — I2589 Other forms of chronic ischemic heart disease: Secondary | ICD-10-CM | POA: Diagnosis not present

## 2014-02-07 DIAGNOSIS — I4891 Unspecified atrial fibrillation: Secondary | ICD-10-CM | POA: Diagnosis not present

## 2014-02-07 LAB — POCT HEMOGLOBIN: Hemoglobin: 15 g/dL (ref 14.1–18.1)

## 2014-02-07 LAB — POCT INR: INR: 1.9

## 2014-02-07 NOTE — Patient Instructions (Signed)
I will get you an appointment to see your urologist ASAP

## 2014-02-07 NOTE — Progress Notes (Signed)
   Subjective:    Patient ID: Caleb [name], male    DOB: 1940/07/15, 73 y.o.   MRN: [id number]  HPI Caleb [name] is a 73 year old married male nonsmoker who comes in today for evaluation of painless hematuria 1 day  He went to urinate yesterday and noticed he was pain bright red blood. Since that time his urinated 7 times all of which are bloody. No pain no fever chills or back pain.  He's on Coumadin 5 mg daily INR today 1.9.  He's never had any problem with kidney stones bladder polyps etc. He says he said biopsy in the past that were extremely painful and if these can have anything done he wants to be sedated. I don't blame him. His urologist is Dr. Lorenso Quarry    Review of Systems    REVIEW OF SYSTEMS OTHERWISE NEGATIVE Objective:   Physical Exam  WELL-DEVELOPED WELL-NOURISHED MALE NO ACUTE DISTRESS VITAL SIGNS STABLE IS AFEBRILE bp IS 120/68  inr 1.9 urine grossly blood      Assessment & Plan:  Hematuria............Marland Kitchen I have a call in for his urologist Dr. Lorenso Quarry

## 2014-02-07 NOTE — Progress Notes (Signed)
Pre visit review using our clinic review tool, if applicable. No additional management support is needed unless otherwise documented below in the visit note.

## 2014-02-08 DIAGNOSIS — R310 Gross hematuria: Secondary | ICD-10-CM | POA: Diagnosis not present

## 2014-02-08 DIAGNOSIS — N401 Enlarged prostate with lower urinary tract symptoms: Secondary | ICD-10-CM | POA: Diagnosis not present

## 2014-02-08 DIAGNOSIS — N3289 Other specified disorders of bladder: Secondary | ICD-10-CM | POA: Diagnosis not present

## 2014-02-08 DIAGNOSIS — R351 Nocturia: Secondary | ICD-10-CM | POA: Diagnosis not present

## 2014-02-08 DIAGNOSIS — R972 Elevated prostate specific antigen [PSA]: Secondary | ICD-10-CM | POA: Diagnosis not present

## 2014-02-09 ENCOUNTER — Telehealth: Payer: Self-pay | Admitting: Internal Medicine

## 2014-02-09 NOTE — Telephone Encounter (Signed)
New message  Pt called states that he takes coumadin for AFIb. He has had servere prostate bleeding. Urologist sent records per pt.. Per Neuologist believes the coumadin is aggrivating the bleeding and they took him off. Pt request a call back to discuss if it is a good idea to come off of coumadin//sr

## 2014-02-10 ENCOUNTER — Encounter: Payer: Self-pay | Admitting: Family Medicine

## 2014-02-10 ENCOUNTER — Ambulatory Visit (INDEPENDENT_AMBULATORY_CARE_PROVIDER_SITE_OTHER): Payer: Medicare Other | Admitting: Family Medicine

## 2014-02-10 VITALS — BP 160/60 | Temp 97.8°F | Wt 159.0 lb

## 2014-02-10 DIAGNOSIS — R319 Hematuria, unspecified: Secondary | ICD-10-CM | POA: Diagnosis not present

## 2014-02-10 DIAGNOSIS — I2589 Other forms of chronic ischemic heart disease: Secondary | ICD-10-CM | POA: Diagnosis not present

## 2014-02-10 LAB — POCT HEMOGLOBIN: HEMOGLOBIN: 12.3 g/dL — AB (ref 14.1–18.1)

## 2014-02-10 NOTE — Progress Notes (Signed)
   Subjective:    Patient ID: Caleb [name], male    DOB: 02-Apr-1940, 73 y.o.   MRN: [id number]  HPI Pt here c/o con't hematuria---he saw pcp Wed and was sent to urology who saw him yesterday.  He was put on med to shrink prostate and was told to "wait and see"  But he is concerned he has bled so much his hgb is low.  He has no pain.  No fatigue, dizziness, sob or cp.    Review of Systems    as above Objective:   Physical Exam BP 160/60 mmHg  Temp(Src) 97.8 F (36.6 C)  Wt 159 lb (72.122 kg) General appearance: alert, cooperative, appears stated age and no distress Abdomen: soft, non-tender; bowel sounds normal; no masses,  no organomegaly   hgb- 12.3    Assessment & Plan:  1. Blood in urine From prostate hgb dropped from 15-12.3 in 3 days-- advised to go to ER but he states he will call the on call dr for urology first to get advise He is off coumadin And taking meds from urology Pt aware if symptoms worsen to go to ER - POC Hemoglobin

## 2014-02-12 ENCOUNTER — Telehealth: Payer: Self-pay | Admitting: Family Medicine

## 2014-02-12 ENCOUNTER — Other Ambulatory Visit (INDEPENDENT_AMBULATORY_CARE_PROVIDER_SITE_OTHER): Payer: Medicare Other

## 2014-02-12 ENCOUNTER — Other Ambulatory Visit: Payer: Self-pay | Admitting: Family Medicine

## 2014-02-12 DIAGNOSIS — I4891 Unspecified atrial fibrillation: Secondary | ICD-10-CM

## 2014-02-12 LAB — HEMOGLOBIN: HEMOGLOBIN: 14.4 g/dL (ref 13.0–17.0)

## 2014-02-12 LAB — PROTIME-INR
INR: 1.1 ratio — ABNORMAL HIGH (ref 0.8–1.0)
Prothrombin Time: 11.9 s (ref 9.6–13.1)

## 2014-02-12 LAB — HEMATOCRIT: HCT: 43.8 % (ref 39.0–52.0)

## 2014-02-12 NOTE — Telephone Encounter (Signed)
Would like a call once orders are entered.

## 2014-02-12 NOTE — Telephone Encounter (Signed)
Pt needs to have labs drawn at [facility] this morning. Needs orders for hemo/hema and INR entered so he can have them drawn today. Has stopped his Coumadin 5 days ago and has an active prostrate bleed.

## 2014-02-12 NOTE — Telephone Encounter (Signed)
Orders placed.  Patient is aware.

## 2014-02-20 DIAGNOSIS — R310 Gross hematuria: Secondary | ICD-10-CM | POA: Diagnosis not present

## 2014-02-23 DIAGNOSIS — H3531 Nonexudative age-related macular degeneration: Secondary | ICD-10-CM | POA: Diagnosis not present

## 2014-02-23 DIAGNOSIS — H35363 Drusen (degenerative) of macula, bilateral: Secondary | ICD-10-CM | POA: Diagnosis not present

## 2014-03-01 ENCOUNTER — Telehealth: Payer: Self-pay | Admitting: *Deleted

## 2014-03-01 MED ORDER — AMITRIPTYLINE HCL 10 MG PO TABS: 10.0000 mg | ORAL_TABLET | Freq: Every day | ORAL | Status: DC

## 2014-03-01 NOTE — Telephone Encounter (Signed)
Rx called in per Dr Sherren Mocha

## 2014-03-02 NOTE — Telephone Encounter (Signed)
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 He is not comfortable starting this back up at the current time secondary to "tough bleeding" recently.  He states he was losing 1gm Hgb every 3 days.  He tells me that Dr. Ola Spurr recommends he restart Coumadin but is waiting until monitor results are complete. He is currently wearing a monitor. He feels comfortable waiting as he had LA appendage in [place] earlier this year.  He will wait until after the monitor and will call back to speak with [name] what options are.  He would also like to know follow up with Caryl Comes, but will address this when he speaks with him later this month or next. Will forward to [name].

## 2014-03-02 NOTE — Telephone Encounter (Signed)
Follow Up         Pt returning phone call. Please call back and advise.

## 2014-03-05 DIAGNOSIS — I491 Atrial premature depolarization: Secondary | ICD-10-CM | POA: Diagnosis not present

## 2014-03-05 DIAGNOSIS — I493 Ventricular premature depolarization: Secondary | ICD-10-CM | POA: Diagnosis not present

## 2014-03-05 DIAGNOSIS — I4891 Unspecified atrial fibrillation: Secondary | ICD-10-CM | POA: Diagnosis not present

## 2014-03-05 NOTE — Telephone Encounter (Signed)
i called pt and he is currently being followed with monitor etc by Adrian Prows  So i have suggested that we allow him to continue to make decisions and that I will be available in the future

## 2014-03-09 DIAGNOSIS — I4891 Unspecified atrial fibrillation: Secondary | ICD-10-CM | POA: Diagnosis not present

## 2014-03-13 IMAGING — CR DG CHEST 2V
2 series · 2 of 2 positions shown · non-contrast
Comparison: Chest radiograph 05/31/2012

CLINICAL DATA: Ten days postop from CABG. Follow-up pleural
effusion.

EXAM:
CHEST  2 VIEW

[view not recorded (1 of 2)]
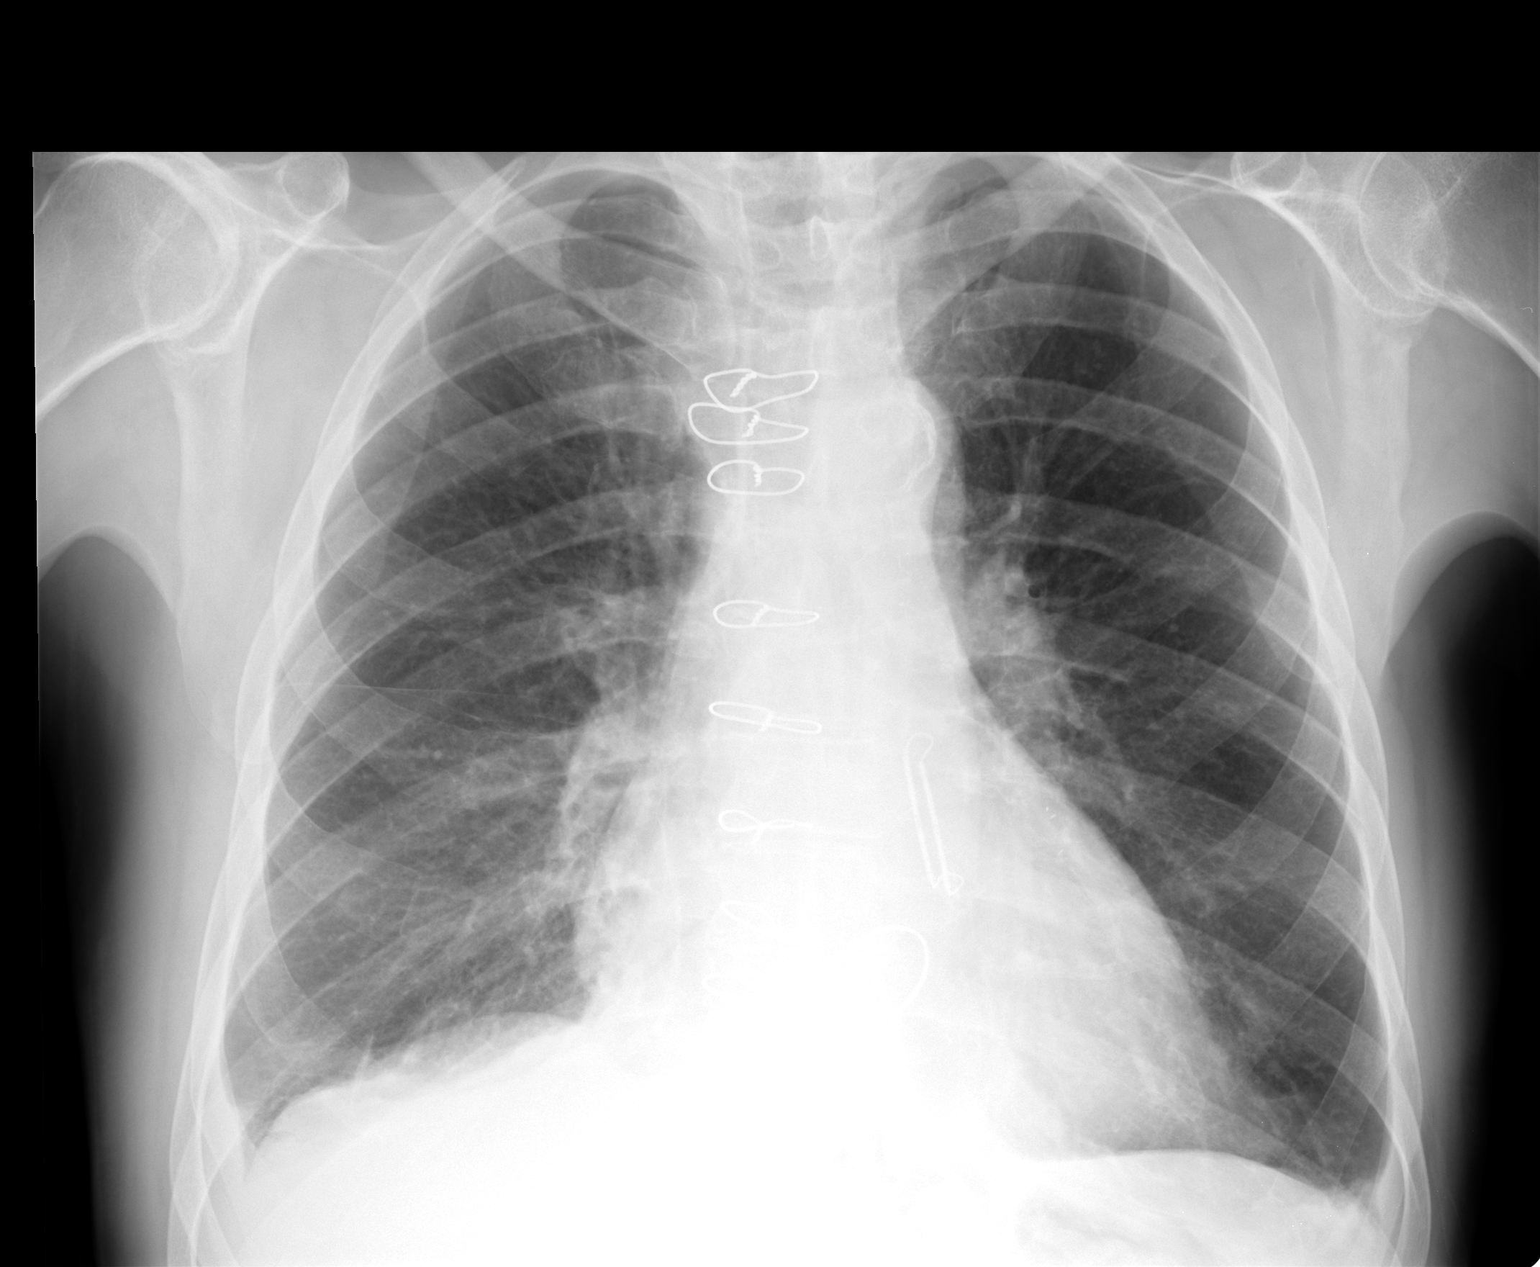

[view not recorded (2 of 2)]
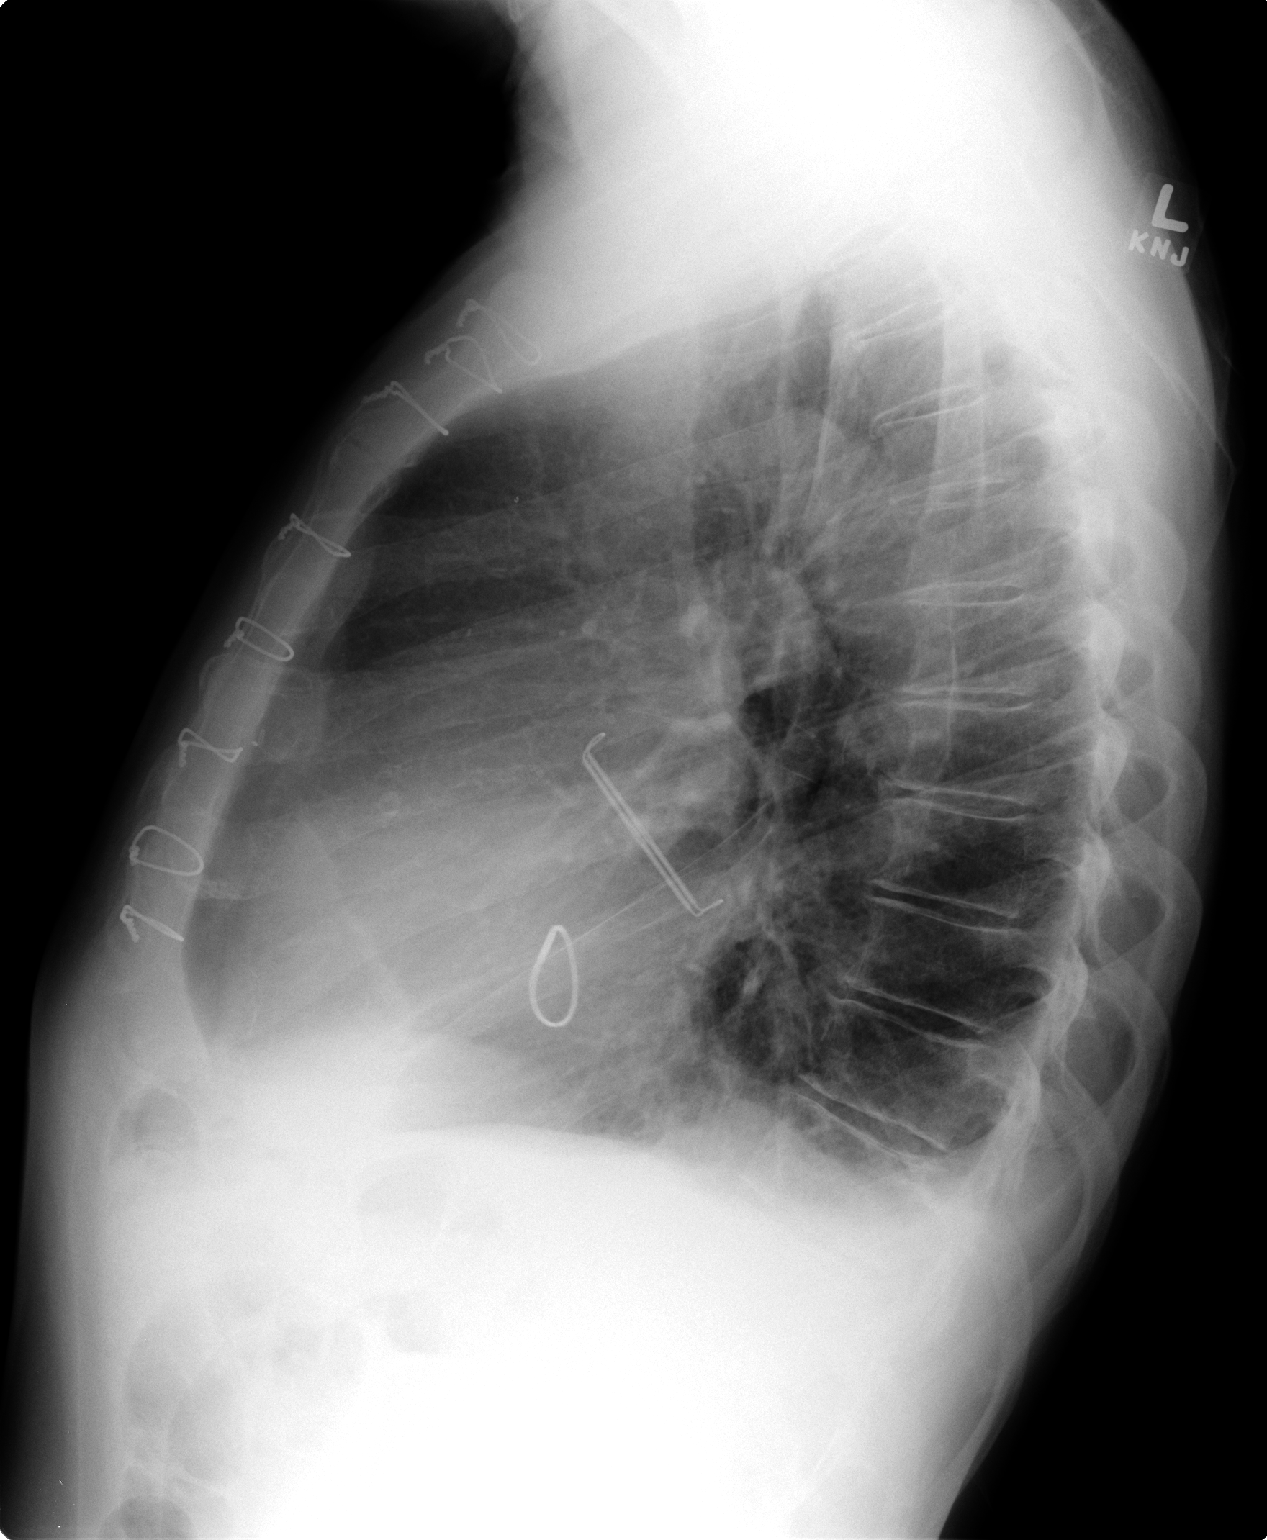

[2 of 2 positions shown; findings below may reference images not displayed]

FINDINGS: The patient has undergone interval sternotomy with evidence of left
atrial appendage clip occlusion and likely mitral valve
annuloplasty. There is mild prominence of the left ventricle.
Cardiomediastinal silhouette is otherwise within normal limits and
unchanged. The lungs are well inflated. There is mild blunting of
the costophrenic angles bilaterally, compatible with small pleural
effusions. There is no evidence of airspace consolidation, edema, or
pneumothorax. No acute osseous abnormality is identified.
IMPRESSION: 1. Small bilateral pleural effusions.
2. Sequelae of interval cardiac surgery.

## 2014-03-18 ENCOUNTER — Encounter (HOSPITAL_COMMUNITY): Payer: Self-pay | Admitting: *Deleted

## 2014-03-18 ENCOUNTER — Emergency Department (INDEPENDENT_AMBULATORY_CARE_PROVIDER_SITE_OTHER)
Admission: EM | Admit: 2014-03-18 | Discharge: 2014-03-18 | Disposition: A | Discharge status: Home / Self Care | Payer: Medicare Other | Source: Home / Self Care | Attending: Family Medicine | Admitting: Family Medicine

## 2014-03-18 DIAGNOSIS — R319 Hematuria, unspecified: Secondary | ICD-10-CM | POA: Diagnosis not present

## 2014-03-18 LAB — CBC
HCT: 42.3 % (ref 39.0–52.0)
HEMOGLOBIN: 13.9 g/dL (ref 13.0–17.0)
MCH: 29.6 pg (ref 26.0–34.0)
MCHC: 32.9 g/dL (ref 30.0–36.0)
MCV: 90.2 fL (ref 78.0–100.0)
Platelets: 171 10*3/uL (ref 150–400)
RBC: 4.69 MIL/uL (ref 4.22–5.81)
RDW: 13.9 % (ref 11.5–15.5)
WBC: 5.7 10*3/uL (ref 4.0–10.5)

## 2014-03-18 LAB — POCT URINALYSIS DIP (DEVICE)
GLUCOSE, UA: 100 mg/dL — AB
Ketones, ur: 15 mg/dL — AB
Nitrite: NEGATIVE
Protein, ur: >=300 mg/dL — AB
Specific Gravity, Urine: 1.015 (ref 1.005–1.030)
Urobilinogen, UA: 2 mg/dL — ABNORMAL HIGH (ref 0.0–1.0)
pH: 8.5 — ABNORMAL HIGH (ref 5.0–8.0)

## 2014-03-18 LAB — APTT: aPTT: 32 seconds (ref 24–37)

## 2014-03-18 LAB — PROTIME-INR
INR: 1.09 (ref 0.00–1.49)
Prothrombin Time: 14.2 seconds (ref 11.6–15.2)

## 2014-03-18 NOTE — ED Notes (Signed)
C/o hematuria onset 5 days ago.  He saw one small clot.  Talked to Dr. Risa Grill yesterday and today and he recommended pt. to get an H and H.  Off Coumadin for 1 month but still takes a baby aspirin. No pain.  Has frequency but is on Lasix.  Had burning last night @ 0200 and it stopped.

## 2014-03-18 NOTE — ED Provider Notes (Signed)
CSN: [id number]     Arrival date & time 03/18/14  1355 History   None    Chief Complaint  Patient presents with  . Hematuria   (Consider location/radiation/quality/duration/timing/severity/associated sxs/prior Treatment) HPI Comments: Patient reports that he has had gross hematuria since 03/14/2014. Is followed by Dr. Risa Grill with [facility] and had similar episode one month. Did undergo cystoscopy one month ago and following this procedure, bleeding stopped. Is no longer taking Warfarin, only aspirin. Is scheduled to see provider at [facility] 03/19/2014 at 8:30am, however, after telephone discussion with his urologist today, it was recommended he come to our facility to have blood work drawn to determine if he has developed anemia as a result of recent hematuria.     Patient is a 73 y.o. male presenting with hematuria. The history is provided by the patient.  Hematuria This is a recurrent problem. Episode onset: current episode began 03/14/2014. The problem occurs constantly. The problem has not changed since onset.Pertinent negatives include no headaches.    Past Medical History  Diagnosis Date  . CAD (coronary artery disease)   . Atrial fibrillation   . Cardiomyopathy, ischemic   . Heart failure, systolic, acute on chronic   . Hypertension   . Hyperlipidemia   . Pleural effusion   . Positional vertigo   . Dizziness   . GERD (gastroesophageal reflux disease)   . Atypical pneumonia   . Cough   . Allergic rhinitis   . TIA (transient ischemic attack)   . OSA (obstructive sleep apnea)     Home sleep test 07/05/2009 AHI 8.2  . Chronic anticoagulation    Past Surgical History  Procedure Laterality Date  . Coronary stent placement  2004    LAD  . Shoulder surgery    . Tee without cardioversion N/A 09/21/2012    Procedure: TRANSESOPHAGEAL ECHOCARDIOGRAM (TEE);  Surgeon: Larey Dresser, MD;  Location: [facility] ENDOSCOPY;  Service: Cardiovascular;  Laterality: N/A;  .  Coronary artery bypass graft  01/09/13    LAD LIMA, left atrial appendage  . Mitral valve annuloplasty  01/09/13  . Aortic valve repair  01/09/13   Family History  Problem Relation Age of Onset  . Adopted: Yes   History  Substance Use Topics  . Smoking status: Never Smoker   . Smokeless tobacco: Never Used  . Alcohol Use: No    Review of Systems  Constitutional: Negative for chills and fatigue.  Respiratory: Negative.   Cardiovascular: Negative.   Gastrointestinal: Negative.   Genitourinary: Positive for hematuria. Negative for dysuria, urgency, frequency, flank pain, decreased urine volume, discharge, penile swelling, scrotal swelling, difficulty urinating, penile pain and testicular pain.  Musculoskeletal: Negative for back pain.  Skin: Negative for pallor.  Neurological: Negative for dizziness, syncope, weakness, light-headedness and headaches.    Allergies  Carvedilol; Sulfonamide derivatives; Pradaxa; and Xarelto  Home Medications   Prior to Admission medications   Medication Sig Start Date End Date Taking? Authorizing Provider  Ascorbic Acid (VITAMIN C) 500 MG CAPS Take 2 capsules by mouth daily.   Yes Historical Provider, MD  aspirin 81 MG chewable tablet Chew 81 mg by mouth daily.   Yes Historical Provider, MD  furosemide (LASIX) 40 MG tablet Take 40 mg by mouth.   Yes Historical Provider, MD  Magnesium 100 MG CAPS Take 1 capsule by mouth 2 (two) times daily.    Yes Historical Provider, MD  Omega-3 Fatty Acids (THE VERY FINEST FISH OIL) LIQD Take 2-3 g by mouth  daily.    Yes Historical Provider, MD  valsartan (DIOVAN) 40 MG tablet TAKE 1 TABLET BY MOUTH ONCE DAILY   Yes Deboraha Sprang, MD  Zinc 100 MG TABS Take by mouth daily.   Yes Historical Provider, MD  amitriptyline (ELAVIL) 10 MG tablet Take 1 tablet (10 mg total) by mouth at bedtime. 03/01/14   Dorena Cookey, MD  Magnesium Hydroxide (MILK OF MAGNESIA PO) Take by mouth as needed.    Historical Provider, MD   nitroGLYCERIN (NITROSTAT) 0.4 MG SL tablet Place 0.4 mg under the tongue every 5 (five) minutes as needed. May repeat for up to 3 doses.     Historical Provider, MD  warfarin (COUMADIN) 5 MG tablet Take as directed by Anticoagulation clinic 09/21/13   Deboraha Sprang, MD   BP 138/72 mmHg  Pulse 93  Temp(Src) 98.1 F (36.7 C) (Oral)  Resp 18  SpO2 98% Physical Exam  Constitutional: He is oriented to person, place, and time. He appears well-developed and well-nourished. No distress.  HENT:  Head: Normocephalic and atraumatic.  Eyes: Conjunctivae are normal.  Cardiovascular: Normal rate, regular rhythm and normal heart sounds.   Pulmonary/Chest: Effort normal and breath sounds normal.  Abdominal: Soft. Normal appearance and bowel sounds are normal. He exhibits no distension and no mass. There is no tenderness. There is no CVA tenderness.  Musculoskeletal: Normal range of motion.  Neurological: He is alert and oriented to person, place, and time.  Skin: Skin is warm and dry. No pallor.  Psychiatric: He has a normal mood and affect. His behavior is normal.  Nursing note and vitals reviewed.   ED Course  Procedures (including critical care time) Labs Review Labs Reviewed  POCT URINALYSIS DIP (DEVICE) - Abnormal; Notable for the following:    Glucose, UA 100 (*)    Bilirubin Urine LARGE (*)    Ketones, ur 15 (*)    Hgb urine dipstick LARGE (*)    pH 8.5 (*)    Protein, ur >=300 (*)    Urobilinogen, UA 2.0 (*)    Leukocytes, UA LARGE (*)    All other components within normal limits  URINE CULTURE  CBC  PROTIME-INR  APTT    Imaging Review No results found.   MDM   1. Hematuria   H/H ok. Coags normal. Is reassured by these results. No clinical indication of urinary retention. Patient plans to follow up with [facility] tomorrow. Does not wish to be transferred to ER for urology evaluation tonight. Voices understanding of red flag reasons for urgent re-evaluation.     Lutricia Feil, [place] 03/18/14 [phone]

## 2014-03-18 NOTE — Discharge Instructions (Signed)
Your hemoglobin is normal. Please keep your appointment with [facility] for tomorrow. If you develop fever, abdominal pain, back pain or the inability to pass your urine, please go directly to your nearest ER for assistance.   Hematuria Hematuria is blood in your urine. It can be caused by a bladder infection, kidney infection, prostate infection, kidney stone, or cancer of your urinary tract. Infections can usually be treated with medicine, and a kidney stone usually will pass through your urine. If neither of these is the cause of your hematuria, further workup to find out the reason may be needed. It is very important that you tell your health care provider about any blood you see in your urine, even if the blood stops without treatment or happens without causing pain. Blood in your urine that happens and then stops and then happens again can be a symptom of a very serious condition. Also, pain is not a symptom in the initial stages of many urinary cancers. HOME CARE INSTRUCTIONS   Drink lots of fluid, 3-4 quarts a day. If you have been diagnosed with an infection, cranberry juice is especially recommended, in addition to large amounts of water.  Avoid caffeine, tea, and carbonated beverages because they tend to irritate the bladder.  Avoid alcohol because it may irritate the prostate.  Take all medicines as directed by your health care provider.  If you were prescribed an antibiotic medicine, finish it all even if you start to feel better.  If you have been diagnosed with a kidney stone, follow your health care provider's instructions regarding straining your urine to catch the stone.  Empty your bladder often. Avoid holding urine for long periods of time.  After a bowel movement, women should cleanse front to back. Use each tissue only once.  Empty your bladder before and after sexual intercourse if you are a male. SEEK MEDICAL CARE IF:  You develop back pain.  You have a  fever.  You have a feeling of sickness in your stomach (nausea) or vomiting.  Your symptoms are not better in 3 days. Return sooner if you are getting worse. SEEK IMMEDIATE MEDICAL CARE IF:   You develop severe vomiting and are unable to keep the medicine down.  You develop severe back or abdominal pain despite taking your medicines.  You begin passing a large amount of blood or clots in your urine.  You feel extremely weak or faint, or you pass out. MAKE SURE YOU:   Understand these instructions.  Will watch your condition.  Will get help right away if you are not doing well or get worse. Document Released: 03/09/2005 Document Revised: 07/24/2013 Document Reviewed: 11/07/2012 [facility] Patient Information 2015 [place], [place]. This information is not intended to replace advice given to you by your health care provider. Make sure you discuss any questions you have with your health care provider.

## 2014-03-19 DIAGNOSIS — R972 Elevated prostate specific antigen [PSA]: Secondary | ICD-10-CM | POA: Diagnosis not present

## 2014-03-19 DIAGNOSIS — R310 Gross hematuria: Secondary | ICD-10-CM | POA: Diagnosis not present

## 2014-03-20 LAB — URINE CULTURE
Colony Count: NO GROWTH
Culture: NO GROWTH

## 2014-03-26 ENCOUNTER — Telehealth: Payer: Self-pay

## 2014-03-26 NOTE — Telephone Encounter (Signed)
Blane Ohara, MD  Minus Breeding, MD Cc: Lurline Del, RN           thx   Ander Purpura can you add him on Tuesday pm?       Previous Messages     ----- Message -----   From: Minus Breeding, MD   Sent: 03/24/2014 10:34 PM    To: Lurline Del, RN, Deboraha Sprang, MD, *   He wants to be seen early next week. His BP has been fluctuating and he is more SOB. I told him to take a little extra Lasix and that I would drop this note.              I spoke with the pt and he feels better today.  BP and pulse are back to normal.  The pt states that he has already spoken with Dr Ola Spurr (Cardiologist in [facility]) this morning and he is starting the pt on Metoprolol 38m daily (pt has not picked up Rx, so unsure if this is correct) based on heart monitor results. The pt states that over the past week his pulse and BP have been erratic and he has had worsened SOB. The pt is scheduled to see Dr FOla Spurrnext week.  The pt said that he would contact our office if he requires further evaluation.

## 2014-04-02 ENCOUNTER — Other Ambulatory Visit: Payer: Self-pay | Admitting: Internal Medicine

## 2014-04-05 DIAGNOSIS — Z79899 Other long term (current) drug therapy: Secondary | ICD-10-CM | POA: Diagnosis not present

## 2014-04-05 DIAGNOSIS — Z9889 Other specified postprocedural states: Secondary | ICD-10-CM | POA: Diagnosis not present

## 2014-04-05 DIAGNOSIS — I5022 Chronic systolic (congestive) heart failure: Secondary | ICD-10-CM | POA: Diagnosis not present

## 2014-04-05 DIAGNOSIS — I2510 Atherosclerotic heart disease of native coronary artery without angina pectoris: Secondary | ICD-10-CM | POA: Diagnosis not present

## 2014-04-05 DIAGNOSIS — I4891 Unspecified atrial fibrillation: Secondary | ICD-10-CM | POA: Diagnosis not present

## 2014-04-05 DIAGNOSIS — R0602 Shortness of breath: Secondary | ICD-10-CM | POA: Diagnosis not present

## 2014-04-05 DIAGNOSIS — Z8679 Personal history of other diseases of the circulatory system: Secondary | ICD-10-CM | POA: Diagnosis not present

## 2014-04-05 DIAGNOSIS — Z951 Presence of aortocoronary bypass graft: Secondary | ICD-10-CM | POA: Diagnosis not present

## 2014-04-19 ENCOUNTER — Ambulatory Visit (HOSPITAL_COMMUNITY): Payer: Medicare Other | Attending: Cardiology

## 2014-04-19 ENCOUNTER — Other Ambulatory Visit (HOSPITAL_COMMUNITY): Payer: Medicare Other | Admitting: Cardiology

## 2014-04-19 ENCOUNTER — Ambulatory Visit (HOSPITAL_BASED_OUTPATIENT_CLINIC_OR_DEPARTMENT_OTHER): Payer: Medicare Other | Admitting: Cardiology

## 2014-04-19 DIAGNOSIS — R42 Dizziness and giddiness: Secondary | ICD-10-CM | POA: Diagnosis not present

## 2014-04-19 DIAGNOSIS — I10 Essential (primary) hypertension: Secondary | ICD-10-CM | POA: Insufficient documentation

## 2014-04-19 DIAGNOSIS — G4733 Obstructive sleep apnea (adult) (pediatric): Secondary | ICD-10-CM | POA: Insufficient documentation

## 2014-04-19 DIAGNOSIS — F419 Anxiety disorder, unspecified: Secondary | ICD-10-CM | POA: Diagnosis not present

## 2014-04-19 DIAGNOSIS — R001 Bradycardia, unspecified: Secondary | ICD-10-CM | POA: Insufficient documentation

## 2014-04-19 DIAGNOSIS — K219 Gastro-esophageal reflux disease without esophagitis: Secondary | ICD-10-CM | POA: Diagnosis not present

## 2014-04-19 DIAGNOSIS — I6523 Occlusion and stenosis of bilateral carotid arteries: Secondary | ICD-10-CM

## 2014-04-19 DIAGNOSIS — I5022 Chronic systolic (congestive) heart failure: Secondary | ICD-10-CM

## 2014-04-19 DIAGNOSIS — E785 Hyperlipidemia, unspecified: Secondary | ICD-10-CM | POA: Diagnosis not present

## 2014-04-19 MED ORDER — PERFLUTREN LIPID MICROSPHERE
1.0000 mL | Freq: Once | INTRAVENOUS | Status: AC
  Administered 2014-04-19: 1 mL via INTRAVENOUS

## 2014-04-19 NOTE — Progress Notes (Signed)
2D Echo with Definity completed. 04/19/2014

## 2014-04-19 NOTE — Progress Notes (Signed)
Carotid duplex performed 

## 2014-04-24 ENCOUNTER — Telehealth: Payer: Self-pay | Admitting: Cardiovascular Disease

## 2014-04-24 NOTE — Telephone Encounter (Signed)
New Msg       Pt returning call.   Please call back.

## 2014-04-25 NOTE — Telephone Encounter (Signed)
Reviewed echo findings with patient. He would like to see Dr Aundra Dubin in [facility].

## 2014-04-25 NOTE — Telephone Encounter (Signed)
F/U       Pt returning call from Dr. Burt Knack about lab results.    Please contact pt on cell as he is going out of town. Cell [phone].

## 2014-04-25 NOTE — Telephone Encounter (Signed)
Patient notified of study results (2D ECHO and Carotid Dopplers) and advisement from Dr. Burt Knack. Patient verbalized understanding and agreement with current treatment plan to be referred to [name] or Dr. Haroldine Laws. Patient requests to see [name]. Routed to Dr. McLean/Dr. Leana Gamer and Arsenio Katz, RNs.

## 2014-04-25 NOTE — Telephone Encounter (Signed)
Thanks, will arrange an appointment for him.   [name], could you please get Caleb [name] a new patient appointment on a day I am in clinic? Thanks. [name]

## 2014-04-26 NOTE — Telephone Encounter (Signed)
appt sch for 2/11

## 2014-04-26 NOTE — Telephone Encounter (Signed)
Called patient to inform him of date/time of appointment with [name]. Patient stated he already received a call this morning and he is planning to be there.

## 2014-04-30 ENCOUNTER — Telehealth: Payer: Self-pay | Admitting: Cardiovascular Disease

## 2014-04-30 NOTE — Telephone Encounter (Signed)
New problem    Pt need to speak to you concerning sending an Echocardiogram report to another physician. I advised him that MR normally does this but he insist I send you the message.

## 2014-04-30 NOTE — Telephone Encounter (Signed)
I spoke with the pt and he would like his Echo report faxed to: Dr Ola Spurr [phone] ATTN: Wendy/Dr Ola Spurr [phone] Attn: Milas Hock  The pt said he was in the car when he had his conversation with Dr Burt Knack and could not focus his full attention on the conversation.  The pt would appreciate if Dr Burt Knack could call him back to discuss his Echo and treatment. I will fax records per the pt's request.

## 2014-05-01 NOTE — Telephone Encounter (Signed)
I spoke with the pt and answered his questions about his upcoming appointment with Dr Aundra Dubin on 05/03/14. The pt said Dr Burt Knack had mentioned that he may need a cardiac catheterization for further evaluation of his symptoms.  I made the pt aware that he needs to see Dr Aundra Dubin first and Dr Aundra Dubin and Dr Burt Knack will determine the plan of care for the patient.

## 2014-05-01 NOTE — Telephone Encounter (Signed)
New Message  Pt called would like to share that there has been some changes within himself since he last received lab reports. Pt states that he would like to discuss the changes that he "think" has happened with the nurse. Would not go further in detail. Please call back to discuss

## 2014-05-03 ENCOUNTER — Other Ambulatory Visit: Payer: Self-pay | Admitting: *Deleted

## 2014-05-03 ENCOUNTER — Encounter: Payer: Self-pay | Admitting: Cardiology

## 2014-05-03 ENCOUNTER — Encounter: Payer: Self-pay | Admitting: *Deleted

## 2014-05-03 ENCOUNTER — Ambulatory Visit (INDEPENDENT_AMBULATORY_CARE_PROVIDER_SITE_OTHER): Payer: Medicare Other | Admitting: Cardiology

## 2014-05-03 ENCOUNTER — Inpatient Hospital Stay (HOSPITAL_COMMUNITY): Admission: RE | Admit: 2014-05-03 | Payer: Medicare Other | Source: Ambulatory Visit

## 2014-05-03 VITALS — BP 110/54 | HR 70 | Ht 70.0 in | Wt 153.0 lb

## 2014-05-03 DIAGNOSIS — Z9889 Other specified postprocedural states: Secondary | ICD-10-CM

## 2014-05-03 DIAGNOSIS — I38 Endocarditis, valve unspecified: Secondary | ICD-10-CM | POA: Diagnosis not present

## 2014-05-03 DIAGNOSIS — I5022 Chronic systolic (congestive) heart failure: Secondary | ICD-10-CM | POA: Diagnosis not present

## 2014-05-03 DIAGNOSIS — M7989 Other specified soft tissue disorders: Secondary | ICD-10-CM | POA: Diagnosis not present

## 2014-05-03 DIAGNOSIS — R0602 Shortness of breath: Secondary | ICD-10-CM

## 2014-05-03 DIAGNOSIS — I480 Paroxysmal atrial fibrillation: Secondary | ICD-10-CM | POA: Diagnosis not present

## 2014-05-03 DIAGNOSIS — I6523 Occlusion and stenosis of bilateral carotid arteries: Secondary | ICD-10-CM | POA: Diagnosis not present

## 2014-05-03 DIAGNOSIS — Z951 Presence of aortocoronary bypass graft: Secondary | ICD-10-CM | POA: Diagnosis not present

## 2014-05-03 DIAGNOSIS — I270 Primary pulmonary hypertension: Secondary | ICD-10-CM

## 2014-05-03 DIAGNOSIS — I2510 Atherosclerotic heart disease of native coronary artery without angina pectoris: Secondary | ICD-10-CM | POA: Diagnosis not present

## 2014-05-03 DIAGNOSIS — I272 Pulmonary hypertension, unspecified: Secondary | ICD-10-CM

## 2014-05-03 MED ORDER — BISOPROLOL FUMARATE 5 MG PO TABS: ORAL_TABLET | ORAL | Status: DC

## 2014-05-03 NOTE — Progress Notes (Signed)
Patient ID: Caleb [name], male   DOB: 02-18-1941, 74 y.o.   MRN: [id number] PCP: Dr. Sherren Mocha EP: [name]  74 yo with complex past history presents for heart failure evaluation.  Patient had anterior MI in 2004 and developed ischemic cardiomyopathy as well as mitral regurgitation. He also developed atrial fibrillation.  In 10/14, he had MV repair, Maze, CABG with LIMA-LAD, and LA appendage closure at [facility] in [place].  Subsequently, atrial fibrillation returned and he had an atrial fibrillation ablation in [facility] in 3/15.  He wore a Zio patch in 12/15 and had a low atrial fibrillation burden of 7%.  He has had a long-standing ischemic cardiomyopathy.  Last year, EF was 20-25%.  Echo done this year also showed EF 25-30% but estimated PA pressure suggested severe pulmonary hypertension, which is new for him.    Recently, he seems to have been doing worse symptomatically.  He has dyspnea that waxes and wanes in significance but is clearly worse than in the past.  He is short of breath walking up steps typically.  His appetite is poor.  He still takes walks but gets short of breath with brisk walking.  Dyspnea with moderate activity generally.  No orthopnea or PND.  No chest pain.  He has chronic left lower leg edema that has not changed recently (minimal edema on right).  For the last 2 weeks, atrial fibrillation seems to have been worse.  He is feeling frequent heart fluttering which is disturbing to him. No lightheadedness or syncope.   ECG: abnormal P wave axis (?ectopic atrial rhythm) with LVH with repolarization abnormality, QRS 120 msec.  Labs (2/15): LDL 144 Labs (8/15): K 4.6, creatinine 0.9 Labs (12/15): HCT 42.3    PMH: 1. CAD: Anterior MI in 2004.  Cardiac surgery in 10/14 included LIMA-LAD.  2. Chronic mitral regurgitation: 10/14 surgery at [facility] with MV repair, Maze, LIMA-LAD, and LA appendage closure.  3. Atrial fibrillation: Paroxysmal.  H/o Maze in 2014.  Had  recurrent atrial fibrillation with ablation in 3/15 by Dr Ola Spurr in [facility].  Not anticoagulated after 2 severe prostate bleeding episodes.  - Zio patch in 12/15 with low atrial fibrillation burden (7%).  4. Chronotropic incompetence.  5. HTN 6. Hyperlipidemia: Refuses statin.  7. Peripheral neuropathy 8. GERD 9. H/o BPPV 10. H/o TIA 11. Ischemic cardiomyopathy: cardiac MRI 7/14 with EF 35%, moderate MR, normal RV size and systolic function, extensive anterior and anteroseptal LGE suggestive of non-viable myocardium (this was prior to LIMA-LAD).  Echo 1/15 with EF 20-25%, moderate AI, PA systolic pressure 39 mmHg. Echo (1/16) with EF 20-25%, diffuse hypokinesis with regionality, moderate LV dilation, moderate AI, s/p MV repair with mild MR and normal gradients, RV dilated with mildly decreased systolic function, PA systolic pressure 71 mmHg.  - Spironolactone apparently caused cognitive deficits - Intolerant of Coreg due to development of severe alopecia 12. Carotid dopplers (1/16) with 40-59% bilateral ICA stenosis.  13. Aortic insufficiency: Moderate by last echo 2/16.   SH: Married, lives in [place], Careers information officer, nonsmoker  FH: CAD  ROS: All systems reviewed and negative except as per HPI.   Current Outpatient Prescriptions  Medication Sig Dispense Refill  . Ascorbic Acid (VITAMIN C) 500 MG CAPS Take 2 capsules by mouth daily.    Marland Kitchen aspirin 81 MG chewable tablet Chew 81 mg by mouth daily.    . furosemide (LASIX) 40 MG tablet Take 40 mg by mouth.    . Magnesium 100  MG CAPS Take 1 capsule by mouth 2 (two) times daily.     . Magnesium Hydroxide (MILK OF MAGNESIA PO) Take by mouth as needed.    . nitroGLYCERIN (NITROSTAT) 0.4 MG SL tablet Place 0.4 mg under the tongue every 5 (five) minutes as needed. May repeat for up to 3 doses.     . Omega-3 Fatty Acids (THE VERY FINEST FISH OIL) LIQD Take 2-3 g by mouth daily.     . potassium chloride SA (K-DUR,KLOR-CON) 20 MEQ  tablet Take 1 tablet by mouth daily.    . valsartan (DIOVAN) 40 MG tablet TAKE 1 TABLET BY MOUTH ONCE DAILY 30 tablet 5  . Zinc 100 MG TABS Take by mouth daily.    . bisoprolol (ZEBETA) 5 MG tablet 1/2 tablet (2.80m ) daily 15 tablet 1  . [DISCONTINUED] eplerenone (INSPRA) 25 MG tablet Take 1 tablet (25 mg total) by mouth daily. 30 tablet 11   No current facility-administered medications for this visit.   BP 110/54 mmHg  Pulse 70  Ht _0  (1.778 m)  Wt 153 lb (69.4 kg)  BMI 21.95 kg/m2 General: NAD Neck: No JVD, no thyromegaly or thyroid nodule.  Lungs: Clear to auscultation bilaterally with normal respiratory effort. CV: Nondisplaced PMI.  Heart regular S1/S2, no S3/S4, 1/6 HSM apex, 2/6 diastolic murmur along the sternal border.  1+ edema 2/3 up left lower leg; trace right ankle edema.  No carotid bruit.  Normal pedal pulses.  Abdomen: Soft, nontender, no hepatosplenomegaly, no distention.  Skin: Intact without lesions or rashes.  Neurologic: Alert and oriented x 3.  Psych: Normal affect. Extremities: No clubbing or cyanosis.  HEENT: Normal.   Assessment/Plan: 1. CAD: This seems stable, no chest pain. S/p LIMA-LAD.  He is on ASA 81.  He has decided not to take statins after reviewing the data.   2. S/p mitral valve repair: The MV repair looked stable on recent echo with mild MR and no evidence for significant mitral stenosis.  3. Aortic insufficiency: Moderate on recent echo, similar to prior.  Diastolic murmur on exam.  4. Chronic systolic CHF: Ischemic cardiomyopathy, EF 20-25%.  This is similar to the past.  However, estimated PA pressure was higher on 1/16 echo than in past and symptoms have evolved, now NYHA class II-III.  On exam, he does not appear particularly volume overloaded, so I would not advocate increasing Lasix at this time. I am concerned that his symptoms could be due to low output.  Also, I am concerned that my estimation of his volume status on exam may be  inaccurate, as the high PA pressure by echo could be a reflection of elevated left atrial filling pressure (pulmonary venous hypertension).  - I think we need a right heart cath to sort out cardiac output, filling pressures, and PA pressure.  I will arrange to do this next week. If cardiac output is on the low side, would add digoxin.  - He is currently on metoprolol tartrate. He has tolerated this without problems (could not take Coreg with severe alopecia).  I will have him stop metoprolol and start bisoprolol 2.5 mg daily (better data in systolic CHF).   - I discussed use of Entresto rather than valsartan. We went over the Paradigm trial.  He wants to do some research so we will readdress this.  - He had ?cognitive problems with spironolactone.  I do think that we could try eplerenone in the future.  - Continue current Lasix for  now.  - He does not want an ICD and understands the reasoning for having one.  - In the future, he could be an LVAD candidate.  5. Atrial fibrillation: s/p Maze in 10/14, then ablation in 3/15. 7% atrial fibrillation on Zio patch in 12/15, but he thinks that he is having significantly more atrial fibrillation now.  It is bothersome to him.  - Would consider repeating Zio patch to see if afib burden is indeed higher currently (will check with EP to see if we offer this test).   - Would consider starting him on Tikosyn. I will review this with [name].  - Currently, he is not anticoagulated due to history of prostate bleeding.  6. Pulmonary hypertension: Severe by echo (was mild on prior echo in 1/15).  This may be an over-estimate.  If PA pressure truly elevated, will need to differentiate pulmonary venous hypertension from elevated left atrial pressure from true pulmonary arterial hypertension.   - As above, will get RHC.  - If PAH is found, chronic PE would be a consideration.  Given chronic asymmetric edema in left lower leg, I will go ahead and get venous dopplers to  evaluate.   Followup with me in 2 wks.   Loralie Champagne 05/03/2014

## 2014-05-03 NOTE — Patient Instructions (Signed)
Stop metoprolol.  Start bisoprolol 2.34m daily. This will be 1/2 of a 521mtablet daily.  Your physician recommends that you return for lab work --BMET/BNP/CBCd  Your physician has requested that you have a lower  extremity venous duplex. This test is an ultrasound of the veins in the legs . It looks at venous blood flow that carries blood from the heart to the legs.  Allow one hour for a Lower Venous exam.  There are no restrictions or special instructions. LEFT LEG  You are scheduled for a Right Heart Catheterization Friday February 19,2015. See instruction sheet.   Your physician recommends that you schedule a follow-up appointment in: [facility] We will be in touch with you about the Zio patch monitor.

## 2014-05-04 ENCOUNTER — Encounter (HOSPITAL_COMMUNITY): Payer: Self-pay | Admitting: Pharmacy Technician

## 2014-05-04 NOTE — Telephone Encounter (Signed)
Pt evaluated by Dr Aundra Dubin yesterday and plans as outlined. He has close heart failure follow-up. I'll be available if he still needs to talk but suspect all of his questions have been answered.  Sherren Mocha 05/04/2014 1:32 PM

## 2014-05-05 ENCOUNTER — Encounter: Payer: Self-pay | Admitting: Cardiology

## 2014-05-05 DIAGNOSIS — R0602 Shortness of breath: Secondary | ICD-10-CM | POA: Diagnosis not present

## 2014-05-07 ENCOUNTER — Other Ambulatory Visit: Payer: Self-pay | Admitting: *Deleted

## 2014-05-07 ENCOUNTER — Telehealth: Payer: Self-pay | Admitting: *Deleted

## 2014-05-07 ENCOUNTER — Ambulatory Visit (HOSPITAL_COMMUNITY): Payer: Medicare Other | Attending: Cardiology | Admitting: Cardiology

## 2014-05-07 ENCOUNTER — Other Ambulatory Visit (INDEPENDENT_AMBULATORY_CARE_PROVIDER_SITE_OTHER): Payer: Medicare Other | Admitting: *Deleted

## 2014-05-07 DIAGNOSIS — Z79899 Other long term (current) drug therapy: Secondary | ICD-10-CM

## 2014-05-07 DIAGNOSIS — I10 Essential (primary) hypertension: Secondary | ICD-10-CM | POA: Diagnosis not present

## 2014-05-07 DIAGNOSIS — M7989 Other specified soft tissue disorders: Secondary | ICD-10-CM | POA: Diagnosis not present

## 2014-05-07 DIAGNOSIS — D649 Anemia, unspecified: Secondary | ICD-10-CM

## 2014-05-07 DIAGNOSIS — R0602 Shortness of breath: Secondary | ICD-10-CM | POA: Insufficient documentation

## 2014-05-07 DIAGNOSIS — I270 Primary pulmonary hypertension: Secondary | ICD-10-CM | POA: Diagnosis not present

## 2014-05-07 DIAGNOSIS — E538 Deficiency of other specified B group vitamins: Secondary | ICD-10-CM

## 2014-05-07 DIAGNOSIS — I272 Pulmonary hypertension, unspecified: Secondary | ICD-10-CM

## 2014-05-07 DIAGNOSIS — R0600 Dyspnea, unspecified: Secondary | ICD-10-CM

## 2014-05-07 LAB — CBC WITH DIFFERENTIAL/PLATELET
Basophils Absolute: 0 10*3/uL (ref 0.0–0.1)
Basophils Relative: 0.4 % (ref 0.0–3.0)
Eosinophils Absolute: 0.5 10*3/uL (ref 0.0–0.7)
Eosinophils Relative: 7.5 % — ABNORMAL HIGH (ref 0.0–5.0)
HEMATOCRIT: 43.2 % (ref 39.0–52.0)
Hemoglobin: 14.4 g/dL (ref 13.0–17.0)
Lymphocytes Relative: 20.4 % (ref 12.0–46.0)
Lymphs Abs: 1.3 10*3/uL (ref 0.7–4.0)
MCHC: 33.4 g/dL (ref 30.0–36.0)
MCV: 88.8 fl (ref 78.0–100.0)
MONOS PCT: 9 % (ref 3.0–12.0)
Monocytes Absolute: 0.6 10*3/uL (ref 0.1–1.0)
NEUTROS ABS: 3.9 10*3/uL (ref 1.4–7.7)
Neutrophils Relative %: 62.7 % (ref 43.0–77.0)
Platelets: 211 10*3/uL (ref 150.0–400.0)
RBC: 4.86 Mil/uL (ref 4.22–5.81)
RDW: 14.3 % (ref 11.5–15.5)
WBC: 6.2 10*3/uL (ref 4.0–10.5)

## 2014-05-07 LAB — BASIC METABOLIC PANEL
BUN: 18 mg/dL (ref 6–23)
CALCIUM: 9.8 mg/dL (ref 8.4–10.5)
CHLORIDE: 97 meq/L (ref 96–112)
CO2: 30 meq/L (ref 19–32)
Creatinine, Ser: 1.02 mg/dL (ref 0.40–1.50)
GFR: 75.57 mL/min (ref >60.00)
Glucose, Bld: 109 mg/dL — ABNORMAL HIGH (ref 70–99)
POTASSIUM: 4.1 meq/L (ref 3.5–5.1)
Sodium: 134 mEq/L — ABNORMAL LOW (ref 135–145)

## 2014-05-07 LAB — BRAIN NATRIURETIC PEPTIDE: PRO B NATRI PEPTIDE: 268 pg/mL — AB (ref 0.0–100.0)

## 2014-05-07 LAB — VITAMIN B12: Vitamin B-12: 884 pg/mL (ref 211–911)

## 2014-05-07 NOTE — Progress Notes (Signed)
Bilateral lower venous duplex performed  

## 2014-05-07 NOTE — Addendum Note (Signed)
Addended by: Eulis Foster on: 05/07/2014 11:48 AM   Modules accepted: Orders

## 2014-05-07 NOTE — Telephone Encounter (Signed)
Pt was in office for lower venous doppler.   He states that he took his first dose of bisoprolol about 8PM 05/04/14. He states about 10-11PM on 05/04/14 he did not feel good and his breathing was shallow.   Pt called EMS and was told when they arrived that his BP and heart rate were Ok. An EKG was done and pt brought tracings to the office.  This was reviewed by Dr Aundra Dubin.  Pt states he elected not to go to hospital.   Pt has taken his previous dose of metoprolol instead of bisoprolol since 05/05/14.  Pt discussed with Dr McLean--Dr Aundra Dubin recommended pt try bisoprolol instead of metoprolol one more time. Pt advised by Dr Aundra Dubin to call if he had problems again on bisoprolol.

## 2014-05-11 ENCOUNTER — Encounter (HOSPITAL_COMMUNITY)
Admission: RE | Disposition: A | Discharge status: Home / Self Care | Payer: Self-pay | Source: Ambulatory Visit | Attending: Cardiology

## 2014-05-11 ENCOUNTER — Encounter (HOSPITAL_COMMUNITY): Payer: Self-pay | Admitting: Cardiology

## 2014-05-11 ENCOUNTER — Ambulatory Visit (HOSPITAL_COMMUNITY)
Admission: RE | Admit: 2014-05-11 | Discharge: 2014-05-11 | Disposition: A | Discharge status: Home / Self Care | Payer: Medicare Other | Source: Ambulatory Visit | Attending: Cardiology | Admitting: Cardiology

## 2014-05-11 DIAGNOSIS — I272 Other secondary pulmonary hypertension: Secondary | ICD-10-CM | POA: Insufficient documentation

## 2014-05-11 DIAGNOSIS — I255 Ischemic cardiomyopathy: Secondary | ICD-10-CM | POA: Insufficient documentation

## 2014-05-11 DIAGNOSIS — K219 Gastro-esophageal reflux disease without esophagitis: Secondary | ICD-10-CM | POA: Insufficient documentation

## 2014-05-11 DIAGNOSIS — Z7982 Long term (current) use of aspirin: Secondary | ICD-10-CM | POA: Diagnosis not present

## 2014-05-11 DIAGNOSIS — I4589 Other specified conduction disorders: Secondary | ICD-10-CM | POA: Diagnosis not present

## 2014-05-11 DIAGNOSIS — Z951 Presence of aortocoronary bypass graft: Secondary | ICD-10-CM | POA: Insufficient documentation

## 2014-05-11 DIAGNOSIS — I509 Heart failure, unspecified: Secondary | ICD-10-CM | POA: Diagnosis not present

## 2014-05-11 DIAGNOSIS — I080 Rheumatic disorders of both mitral and aortic valves: Secondary | ICD-10-CM | POA: Diagnosis not present

## 2014-05-11 DIAGNOSIS — I252 Old myocardial infarction: Secondary | ICD-10-CM | POA: Diagnosis not present

## 2014-05-11 DIAGNOSIS — I10 Essential (primary) hypertension: Secondary | ICD-10-CM | POA: Diagnosis not present

## 2014-05-11 DIAGNOSIS — I6523 Occlusion and stenosis of bilateral carotid arteries: Secondary | ICD-10-CM | POA: Insufficient documentation

## 2014-05-11 DIAGNOSIS — I480 Paroxysmal atrial fibrillation: Secondary | ICD-10-CM | POA: Insufficient documentation

## 2014-05-11 DIAGNOSIS — E785 Hyperlipidemia, unspecified: Secondary | ICD-10-CM | POA: Insufficient documentation

## 2014-05-11 DIAGNOSIS — I2510 Atherosclerotic heart disease of native coronary artery without angina pectoris: Secondary | ICD-10-CM | POA: Diagnosis not present

## 2014-05-11 DIAGNOSIS — G629 Polyneuropathy, unspecified: Secondary | ICD-10-CM | POA: Diagnosis not present

## 2014-05-11 DIAGNOSIS — Z8673 Personal history of transient ischemic attack (TIA), and cerebral infarction without residual deficits: Secondary | ICD-10-CM | POA: Insufficient documentation

## 2014-05-11 DIAGNOSIS — I5022 Chronic systolic (congestive) heart failure: Secondary | ICD-10-CM | POA: Insufficient documentation

## 2014-05-11 HISTORY — PX: RIGHT HEART CATHETERIZATION: SHX5447

## 2014-05-11 LAB — BASIC METABOLIC PANEL
Anion gap: 4 — ABNORMAL LOW (ref 5–15)
BUN: 13 mg/dL (ref 6–23)
CHLORIDE: 98 mmol/L (ref 96–112)
CO2: 34 mmol/L — AB (ref 19–32)
CREATININE: 1.05 mg/dL (ref 0.50–1.35)
Calcium: 9.5 mg/dL (ref 8.4–10.5)
GFR calc non Af Amer: 67 mL/min — ABNORMAL LOW (ref >90)
GFR, EST AFRICAN AMERICAN: 78 mL/min — AB (ref >90)
GLUCOSE: 92 mg/dL (ref 70–99)
POTASSIUM: 4 mmol/L (ref 3.5–5.1)
Sodium: 136 mmol/L (ref 135–145)

## 2014-05-11 LAB — CBC
HCT: 44.3 % (ref 39.0–52.0)
Hemoglobin: 14.8 g/dL (ref 13.0–17.0)
MCH: 29.7 pg (ref 26.0–34.0)
MCHC: 33.4 g/dL (ref 30.0–36.0)
MCV: 89 fL (ref 78.0–100.0)
PLATELETS: 189 10*3/uL (ref 150–400)
RBC: 4.98 MIL/uL (ref 4.22–5.81)
RDW: 14 % (ref 11.5–15.5)
WBC: 6.4 10*3/uL (ref 4.0–10.5)

## 2014-05-11 LAB — PROTIME-INR
INR: 1.09 (ref 0.00–1.49)
Prothrombin Time: 14.3 seconds (ref 11.6–15.2)

## 2014-05-11 SURGERY — RIGHT HEART CATH
Anesthesia: LOCAL

## 2014-05-11 MED ORDER — HEPARIN (PORCINE) IN NACL 2-0.9 UNIT/ML-% IJ SOLN
INTRAMUSCULAR | Status: AC
  Filled 2014-05-11: qty 500

## 2014-05-11 MED ORDER — ONDANSETRON HCL 4 MG/2ML IJ SOLN: 4.0000 mg | Freq: Four times a day (QID) | INTRAMUSCULAR | Status: DC | PRN

## 2014-05-11 MED ORDER — SODIUM CHLORIDE 0.9 % IV SOLN: 250.0000 mL | INTRAVENOUS | Status: DC | PRN

## 2014-05-11 MED ORDER — SODIUM CHLORIDE 0.9 % IJ SOLN: 3.0000 mL | INTRAMUSCULAR | Status: DC | PRN

## 2014-05-11 MED ORDER — SODIUM CHLORIDE 0.9 % IJ SOLN: 3.0000 mL | Freq: Two times a day (BID) | INTRAMUSCULAR | Status: DC

## 2014-05-11 MED ORDER — MIDAZOLAM HCL 2 MG/2ML IJ SOLN
INTRAMUSCULAR | Status: AC
  Filled 2014-05-11: qty 2

## 2014-05-11 MED ORDER — LIDOCAINE HCL (PF) 1 % IJ SOLN
INTRAMUSCULAR | Status: AC
  Filled 2014-05-11: qty 30

## 2014-05-11 MED ORDER — ASPIRIN 81 MG PO CHEW: 81.0000 mg | CHEWABLE_TABLET | ORAL | Status: AC

## 2014-05-11 MED ORDER — FENTANYL CITRATE 0.05 MG/ML IJ SOLN
INTRAMUSCULAR | Status: AC
  Filled 2014-05-11: qty 2

## 2014-05-11 MED ORDER — SODIUM CHLORIDE 0.9 % IV SOLN
INTRAVENOUS | Status: DC
  Administered 2014-05-11: 1000 mL via INTRAVENOUS

## 2014-05-11 MED ORDER — ASPIRIN 81 MG PO CHEW
CHEWABLE_TABLET | ORAL | Status: AC
  Filled 2014-05-11: qty 1

## 2014-05-11 MED ORDER — ACETAMINOPHEN 325 MG PO TABS: 650.0000 mg | ORAL_TABLET | ORAL | Status: DC | PRN

## 2014-05-11 NOTE — Progress Notes (Signed)
Site area: Environmental health practitioner Prior to Removal:  Level  0 Pressure Applied For: 25 minutes Manual:   yes Patient Status During Pull:  stable Post Pull Site:  Level  0 Post Pull Instructions Given:  yes Post Pull Pulses Present: yes Dressing Applied:  tegaderm Bedrest begins @ -- Comments: 0 complications

## 2014-05-11 NOTE — Progress Notes (Addendum)
Site area: rt groin Site Prior to Removal:  Level 0 Pressure Applied For: 15 minutes Manual:   yes Patient Status During Pull:  stable Post Pull Site:  Level 0 Post Pull Instructions Given:  yes Post Pull Pulses Present: yes Dressing Applied:  tegaderm Bedrest begins @  1000 Comments: no complications. IV saline locked

## 2014-05-11 NOTE — Discharge Instructions (Signed)
Angiogram, Care After Refer to this sheet in the next few weeks. These instructions provide you with information on caring for yourself after your procedure. Your health care provider may also give you more specific instructions. Your treatment has been planned according to current medical practices, but problems sometimes occur. Call your health care provider if you have any problems or questions after your procedure.  WHAT TO EXPECT AFTER THE PROCEDURE After your procedure, it is typical to have the following sensations:  Minor discomfort or tenderness and a small bump at the catheter insertion site. The bump should usually decrease in size and tenderness within 1 to 2 weeks.  Any bruising will usually fade within 2 to 4 weeks. HOME CARE INSTRUCTIONS   You may need to keep taking blood thinners if they were prescribed for you. Take medicines only as directed by your health care provider.  Do not apply powder or lotion to the site.  Do not take baths, swim, or use a hot tub until your health care provider approves.  You may shower 24 hours after the procedure. Remove the bandage (dressing) and gently wash the site with plain soap and water. Gently pat the site dry.  Inspect the site at least twice daily.  Limit your activity for the first 48 hours. Do not bend, squat, or lift anything over 20 lb (9 kg) or as directed by your health care provider.  Plan to have someone take you home after the procedure. Follow instructions about when you can drive or return to work. SEEK MEDICAL CARE IF:  You get light-headed when standing up.  You have drainage (other than a small amount of blood on the dressing).  You have chills.  You have a fever.  You have redness, warmth, swelling, or pain at the insertion site. SEEK IMMEDIATE MEDICAL CARE IF:   You develop chest pain or shortness of breath, feel faint, or pass out.  You have bleeding, swelling larger than a walnut, or drainage from the  catheter insertion site.  You develop pain, discoloration, coldness, or severe bruising in the leg or arm that held the catheter.  You develop bleeding from any other place, such as the bowels. You may see bright red blood in your urine or stools, or your stools may appear black and tarry.  You have heavy bleeding from the site. If this happens, hold pressure on the site. MAKE SURE YOU:  Understand these instructions.  Will watch your condition.  Will get help right away if you are not doing well or get worse. Document Released: 09/25/2004 Document Revised: 07/24/2013 Document Reviewed: 08/01/2012 [facility] Patient Information 2015 [place], [place]. This information is not intended to replace advice given to you by your health care provider. Make sure you discuss any questions you have with your health care provider.

## 2014-05-11 NOTE — H&P (View-Only) (Signed)
Patient ID: Caleb [name], male   DOB: 02/23/1941, 73 y.o.   MRN: [id number] PCP: Dr. Todd EP: Dr. Klein  73 yo with complex past history presents for heart failure evaluation.  Patient had anterior MI in 2004 and developed ischemic cardiomyopathy as well as mitral regurgitation. He also developed atrial fibrillation.  In 10/14, he had MV repair, Maze, CABG with LIMA-LAD, and LA appendage closure at [facility] in [place].  Subsequently, atrial fibrillation returned and he had an atrial fibrillation ablation in [place] in 3/15.  He wore a Zio patch in 12/15 and had a low atrial fibrillation burden of 7%.  He has had a long-standing ischemic cardiomyopathy.  Last year, EF was 20-25%.  Echo done this year also showed EF 25-30% but estimated PA pressure suggested severe pulmonary hypertension, which is new for him.    Recently, he seems to have been doing worse symptomatically.  He has dyspnea that waxes and wanes in significance but is clearly worse than in the past.  He is short of breath walking up steps typically.  His appetite is poor.  He still takes walks but gets short of breath with brisk walking.  Dyspnea with moderate activity generally.  No orthopnea or PND.  No chest pain.  He has chronic left lower leg edema that has not changed recently (minimal edema on right).  For the last 2 weeks, atrial fibrillation seems to have been worse.  He is feeling frequent heart fluttering which is disturbing to him. No lightheadedness or syncope.   ECG: abnormal P wave axis (?ectopic atrial rhythm) with LVH with repolarization abnormality, QRS 120 msec.  Labs (2/15): LDL 144 Labs (8/15): K 4.6, creatinine 0.9 Labs (12/15): HCT 42.3    PMH: 1. CAD: Anterior MI in 2004.  Cardiac surgery in 10/14 included LIMA-LAD.  2. Chronic mitral regurgitation: 10/14 surgery at [facility] with MV repair, Maze, LIMA-LAD, and LA appendage closure.  3. Atrial fibrillation: Paroxysmal.  H/o Maze in 2014.  Had  recurrent atrial fibrillation with ablation in 3/15 by Dr Fitzgerald in [place].  Not anticoagulated after 2 severe prostate bleeding episodes.  - Zio patch in 12/15 with low atrial fibrillation burden (7%).  4. Chronotropic incompetence.  5. HTN 6. Hyperlipidemia: Refuses statin.  7. Peripheral neuropathy 8. GERD 9. H/o BPPV 10. H/o TIA 11. Ischemic cardiomyopathy: cardiac MRI 7/14 with EF 35%, moderate MR, normal RV size and systolic function, extensive anterior and anteroseptal LGE suggestive of non-viable myocardium (this was prior to LIMA-LAD).  Echo 1/15 with EF 20-25%, moderate AI, PA systolic pressure 39 mmHg. Echo (1/16) with EF 20-25%, diffuse hypokinesis with regionality, moderate LV dilation, moderate AI, s/p MV repair with mild MR and normal gradients, RV dilated with mildly decreased systolic function, PA systolic pressure 71 mmHg.  - Spironolactone apparently caused cognitive deficits - Intolerant of Coreg due to development of severe alopecia 12. Carotid dopplers (1/16) with 40-59% bilateral ICA stenosis.  13. Aortic insufficiency: Moderate by last echo 2/16.   SH: Married, lives in [place], practicing psychologist, nonsmoker  FH: CAD  ROS: All systems reviewed and negative except as per HPI.   Current Outpatient Prescriptions  Medication Sig Dispense Refill  . Ascorbic Acid (VITAMIN C) 500 MG CAPS Take 2 capsules by mouth daily.    . aspirin 81 MG chewable tablet Chew 81 mg by mouth daily.    . furosemide (LASIX) 40 MG tablet Take 40 mg by mouth.    . Magnesium 100   MG CAPS Take 1 capsule by mouth 2 (two) times daily.     . Magnesium Hydroxide (MILK OF MAGNESIA PO) Take by mouth as needed.    . nitroGLYCERIN (NITROSTAT) 0.4 MG SL tablet Place 0.4 mg under the tongue every 5 (five) minutes as needed. May repeat for up to 3 doses.     . Omega-3 Fatty Acids (THE VERY FINEST FISH OIL) LIQD Take 2-3 g by mouth daily.     . potassium chloride SA (K-DUR,KLOR-CON) 20 MEQ  tablet Take 1 tablet by mouth daily.    . valsartan (DIOVAN) 40 MG tablet TAKE 1 TABLET BY MOUTH ONCE DAILY 30 tablet 5  . Zinc 100 MG TABS Take by mouth daily.    . bisoprolol (ZEBETA) 5 MG tablet 1/2 tablet (2.5mg ) daily 15 tablet 1  . [DISCONTINUED] eplerenone (INSPRA) 25 MG tablet Take 1 tablet (25 mg total) by mouth daily. 30 tablet 11   No current facility-administered medications for this visit.   BP 110/54 mmHg  Pulse 70  Ht 5' 10" (1.778 m)  Wt 153 lb (69.4 kg)  BMI 21.95 kg/m2 General: NAD Neck: No JVD, no thyromegaly or thyroid nodule.  Lungs: Clear to auscultation bilaterally with normal respiratory effort. CV: Nondisplaced PMI.  Heart regular S1/S2, no S3/S4, 1/6 HSM apex, 2/6 diastolic murmur along the sternal border.  1+ edema 2/3 up left lower leg; trace right ankle edema.  No carotid bruit.  Normal pedal pulses.  Abdomen: Soft, nontender, no hepatosplenomegaly, no distention.  Skin: Intact without lesions or rashes.  Neurologic: Alert and oriented x 3.  Psych: Normal affect. Extremities: No clubbing or cyanosis.  HEENT: Normal.   Assessment/Plan: 1. CAD: This seems stable, no chest pain. S/p LIMA-LAD.  He is on ASA 81.  He has decided not to take statins after reviewing the data.   2. S/p mitral valve repair: The MV repair looked stable on recent echo with mild MR and no evidence for significant mitral stenosis.  3. Aortic insufficiency: Moderate on recent echo, similar to prior.  Diastolic murmur on exam.  4. Chronic systolic CHF: Ischemic cardiomyopathy, EF 20-25%.  This is similar to the past.  However, estimated PA pressure was higher on 1/16 echo than in past and symptoms have evolved, now NYHA class II-III.  On exam, he does not appear particularly volume overloaded, so I would not advocate increasing Lasix at this time. I am concerned that his symptoms could be due to low output.  Also, I am concerned that my estimation of his volume status on exam may be  inaccurate, as the high PA pressure by echo could be a reflection of elevated left atrial filling pressure (pulmonary venous hypertension).  - I think we need a right heart cath to sort out cardiac output, filling pressures, and PA pressure.  I will arrange to do this next week. If cardiac output is on the low side, would add digoxin.  - He is currently on metoprolol tartrate. He has tolerated this without problems (could not take Coreg with severe alopecia).  I will have him stop metoprolol and start bisoprolol 2.5 mg daily (better data in systolic CHF).   - I discussed use of Entresto rather than valsartan. We went over the Paradigm trial.  He wants to do some research so we will readdress this.  - He had ?cognitive problems with spironolactone.  I do think that we could try eplerenone in the future.  - Continue current Lasix for   now.  - He does not want an ICD and understands the reasoning for having one.  - In the future, he could be an LVAD candidate.  5. Atrial fibrillation: s/p Maze in 10/14, then ablation in 3/15. 7% atrial fibrillation on Zio patch in 12/15, but he thinks that he is having significantly more atrial fibrillation now.  It is bothersome to him.  - Would consider repeating Zio patch to see if afib burden is indeed higher currently (will check with EP to see if we offer this test).   - Would consider starting him on Tikosyn. I will review this with [name].  - Currently, he is not anticoagulated due to history of prostate bleeding.  6. Pulmonary hypertension: Severe by echo (was mild on prior echo in 1/15).  This may be an over-estimate.  If PA pressure truly elevated, will need to differentiate pulmonary venous hypertension from elevated left atrial pressure from true pulmonary arterial hypertension.   - As above, will get RHC.  - If PAH is found, chronic PE would be a consideration.  Given chronic asymmetric edema in left lower leg, I will go ahead and get venous dopplers to  evaluate.   Followup with me in 2 wks.   Loralie Champagne 05/03/2014

## 2014-05-11 NOTE — CV Procedure (Signed)
    Cardiac Catheterization Procedure Note  Name: Caleb [name] MRN: [id number] DOB: [birth year]  Procedure: Right Heart Cath  Indication: CHF, ?pulmonary hypertension   Procedural Details: The right groin was prepped, draped, and anesthetized with 1% lidocaine. Using the modified Seldinger technique a 5 French sheath was placed in the right femoral artery and a 7 French sheath was placed in the right femoral vein. A Swan-Ganz catheter was used for the right heart catheterization. Standard protocol was followed for recording of right heart pressures and sampling of oxygen saturations. Fick and thermodilution cardiac output was calculated. There were no immediate procedural complications. The patient was transferred to the post catheterization recovery area for further monitoring.  Procedural Findings: Hemodynamics (mmHg) RA mean 6 RV 47/9 PA 61/25, mean 40 PCWP mean 23  Oxygen saturations: PA 68% AO 99%  Cardiac Output (Fick) 3.96  Cardiac Index (Fick) 2.13 PVR 4.3 WU  Cardiac Output (Thermo) 3.59 Cardiac Index (Thermo) 1.93  PVR 4.7 WU  Final Conclusions:  Mildly elevated left and right heart filling pressures with low cardiac output.  The patient has moderate pulmonary hypertension.  Some of this is pulmonary venous hypertension from elevated left atrial pressure, but PA pressure is elevated out of proportion to the PCWP elevation.  There is a component of PAH, possibly from pulmonary vascular remodeling in the setting of long-standing mitral valve disease prior to MV repair in 2014.  I will arrange for a sleep study and V/Q scan.  At followup, will consider addition of tadalafil to his regimen.   Loralie Champagne 05/11/2014, 9:01 AM

## 2014-05-11 NOTE — Interval H&P Note (Signed)
History and Physical Interval Note:  05/11/2014 7:50 AM  Caleb [name]  has presented today for surgery, with the diagnosis of pulmonary hypertension  The various methods of treatment have been discussed with the patient and family. After consideration of risks, benefits and other options for treatment, the patient has consented to  Procedure(s): RIGHT HEART CATH (N/A) as a surgical intervention .  The patient's history has been reviewed, patient examined, no change in status, stable for surgery.  I have reviewed the patient's chart and labs.  Questions were answered to the patient's satisfaction.     Symia Herdt [employer]

## 2014-05-14 LAB — POCT I-STAT 3, VENOUS BLOOD GAS (G3P V)
ACID-BASE EXCESS: 1 mmol/L (ref 0.0–2.0)
BICARBONATE: 27.8 meq/L — AB (ref 20.0–24.0)
O2 SAT: 68 %
PO2 VEN: 38 mmHg (ref 30.0–45.0)
TCO2: 29 mmol/L (ref 0–100)
pCO2, Ven: 51.1 mmHg — ABNORMAL HIGH (ref 45.0–50.0)
pH, Ven: 7.343 — ABNORMAL HIGH (ref 7.250–7.300)

## 2014-05-14 LAB — POCT I-STAT 3, ART BLOOD GAS (G3+)
Acid-base deficit: 1 mmol/L (ref 0.0–2.0)
Bicarbonate: 23.6 mEq/L (ref 20.0–24.0)
O2 Saturation: 99 %
TCO2: 25 mmol/L (ref 0–100)
pCO2 arterial: 36.6 mmHg (ref 35.0–45.0)
pH, Arterial: 7.417 (ref 7.350–7.450)
pO2, Arterial: 130 mmHg — ABNORMAL HIGH (ref 80.0–100.0)

## 2014-05-15 ENCOUNTER — Telehealth: Payer: Self-pay | Admitting: Cardiology

## 2014-05-15 ENCOUNTER — Other Ambulatory Visit: Payer: Self-pay

## 2014-05-15 ENCOUNTER — Telehealth (HOSPITAL_COMMUNITY): Payer: Self-pay | Admitting: *Deleted

## 2014-05-15 DIAGNOSIS — I272 Pulmonary hypertension, unspecified: Secondary | ICD-10-CM

## 2014-05-15 DIAGNOSIS — I2782 Chronic pulmonary embolism: Secondary | ICD-10-CM

## 2014-05-15 NOTE — Telephone Encounter (Signed)
-----  Message from Larey Dresser, MD sent at 05/11/2014  9:08 AM EST ----- Please arrange for Caleb [name] to get a V/Q scan looking for chronic PE.  He also will need sleep study to look for OSA. Thanks.  Make sure he has followup with me.

## 2014-05-15 NOTE — Telephone Encounter (Signed)
He needs to have a sleep study and a V/Q scan to rule out chronic PEs.  Please arrange.

## 2014-05-15 NOTE — Telephone Encounter (Addendum)
**Note De-Identified  Obfuscation** The pt states that he is to be schedule to have a test that Dr Aundra Dubin ordered after his cath on 05/11/14 (he cant remember the name of test). According to the pts cath report Dr Aundra Dubin noted that the pt needs to have a sleep study and a VQ scan. The pt is requesting that his test not be scheduled on this Friday (05/19/14) and that it be scheduled to be done after 10 am. Will forward note to Dr Aundra Dubin and his RN, Webb Silversmith.

## 2014-05-15 NOTE — Telephone Encounter (Signed)
New Message  Pt wanted to speak w/ Rn about test. Please call back and discuss.

## 2014-05-15 NOTE — Telephone Encounter (Signed)
Sleep study sch for 4/29 at 8 pm, they will mail pt a packet, attempted to sch vq however central sch is closed until 1 pm due to moving locations will try this afternoon

## 2014-05-16 NOTE — Telephone Encounter (Signed)
Sleep study- 4/3 VQ scan- scheduling pending

## 2014-05-17 NOTE — Telephone Encounter (Signed)
VQ scan scheduled for 2/29 @ 9  Pt aware

## 2014-05-18 ENCOUNTER — Other Ambulatory Visit: Payer: Self-pay | Admitting: Pulmonary Disease

## 2014-05-21 ENCOUNTER — Ambulatory Visit (HOSPITAL_COMMUNITY)
Admission: RE | Admit: 2014-05-21 | Discharge: 2014-05-21 | Disposition: A | Discharge status: Home / Self Care | Payer: Medicare Other | Source: Ambulatory Visit | Attending: Cardiology | Admitting: Cardiology

## 2014-05-21 ENCOUNTER — Encounter (HOSPITAL_COMMUNITY)
Admission: RE | Admit: 2014-05-21 | Discharge: 2014-05-21 | Disposition: A | Discharge status: Home / Self Care | Payer: Medicare Other | Source: Ambulatory Visit | Attending: Cardiology | Admitting: Cardiology

## 2014-05-21 DIAGNOSIS — I272 Pulmonary hypertension, unspecified: Secondary | ICD-10-CM

## 2014-05-21 DIAGNOSIS — I270 Primary pulmonary hypertension: Secondary | ICD-10-CM | POA: Insufficient documentation

## 2014-05-21 DIAGNOSIS — J439 Emphysema, unspecified: Secondary | ICD-10-CM | POA: Insufficient documentation

## 2014-05-21 DIAGNOSIS — R0602 Shortness of breath: Secondary | ICD-10-CM | POA: Diagnosis not present

## 2014-05-21 MED ORDER — TECHNETIUM TO 99M ALBUMIN AGGREGATED
6.0000 | Freq: Once | INTRAVENOUS | Status: AC | PRN
  Administered 2014-05-21: 6 via INTRAVENOUS

## 2014-05-21 MED ORDER — TECHNETIUM TC 99M DIETHYLENETRIAME-PENTAACETIC ACID: 40.0000 | Freq: Once | INTRAVENOUS | Status: AC | PRN

## 2014-05-23 ENCOUNTER — Other Ambulatory Visit: Payer: Self-pay | Admitting: Pulmonary Disease

## 2014-05-23 DIAGNOSIS — I272 Pulmonary hypertension, unspecified: Secondary | ICD-10-CM

## 2014-05-23 DIAGNOSIS — R0609 Other forms of dyspnea: Secondary | ICD-10-CM

## 2014-05-23 DIAGNOSIS — R509 Fever, unspecified: Secondary | ICD-10-CM

## 2014-05-23 DIAGNOSIS — Z114 Encounter for screening for human immunodeficiency virus [HIV]: Secondary | ICD-10-CM

## 2014-05-24 ENCOUNTER — Ambulatory Visit (HOSPITAL_COMMUNITY)
Admission: RE | Admit: 2014-05-24 | Discharge: 2014-05-24 | Disposition: A | Discharge status: Home / Self Care | Payer: Medicare Other | Source: Ambulatory Visit | Attending: Cardiology | Admitting: Cardiology

## 2014-05-24 ENCOUNTER — Encounter (HOSPITAL_COMMUNITY): Payer: Self-pay

## 2014-05-24 VITALS — BP 110/52 | HR 54 | Wt 155.4 lb

## 2014-05-24 DIAGNOSIS — I351 Nonrheumatic aortic (valve) insufficiency: Secondary | ICD-10-CM | POA: Insufficient documentation

## 2014-05-24 DIAGNOSIS — I272 Other secondary pulmonary hypertension: Secondary | ICD-10-CM | POA: Diagnosis not present

## 2014-05-24 DIAGNOSIS — R0609 Other forms of dyspnea: Secondary | ICD-10-CM | POA: Diagnosis not present

## 2014-05-24 DIAGNOSIS — I5022 Chronic systolic (congestive) heart failure: Secondary | ICD-10-CM | POA: Diagnosis not present

## 2014-05-24 DIAGNOSIS — I270 Primary pulmonary hypertension: Secondary | ICD-10-CM

## 2014-05-24 DIAGNOSIS — I4891 Unspecified atrial fibrillation: Secondary | ICD-10-CM | POA: Diagnosis not present

## 2014-05-24 DIAGNOSIS — R509 Fever, unspecified: Secondary | ICD-10-CM | POA: Diagnosis not present

## 2014-05-24 DIAGNOSIS — I2510 Atherosclerotic heart disease of native coronary artery without angina pectoris: Secondary | ICD-10-CM | POA: Insufficient documentation

## 2014-05-24 DIAGNOSIS — I480 Paroxysmal atrial fibrillation: Secondary | ICD-10-CM

## 2014-05-24 DIAGNOSIS — I5020 Unspecified systolic (congestive) heart failure: Secondary | ICD-10-CM | POA: Diagnosis not present

## 2014-05-24 LAB — BASIC METABOLIC PANEL
ANION GAP: 8 (ref 5–15)
BUN: 15 mg/dL (ref 6–23)
CHLORIDE: 102 mmol/L (ref 96–112)
CO2: 27 mmol/L (ref 19–32)
CREATININE: 0.92 mg/dL (ref 0.50–1.35)
Calcium: 9.6 mg/dL (ref 8.4–10.5)
GFR calc non Af Amer: 80 mL/min — ABNORMAL LOW (ref >90)
Glucose, Bld: 99 mg/dL (ref 70–99)
POTASSIUM: 4.2 mmol/L (ref 3.5–5.1)
Sodium: 137 mmol/L (ref 135–145)

## 2014-05-24 LAB — MAGNESIUM: Magnesium: 2.2 mg/dL (ref 1.5–2.5)

## 2014-05-24 LAB — DIGOXIN LEVEL: Digoxin Level: 0.4 ng/mL — ABNORMAL LOW (ref 0.8–2.0)

## 2014-05-24 LAB — BRAIN NATRIURETIC PEPTIDE: B Natriuretic Peptide: 495.2 pg/mL — ABNORMAL HIGH (ref 0.0–100.0)

## 2014-05-24 LAB — TSH: TSH: 2.01 u[IU]/mL (ref 0.350–4.500)

## 2014-05-24 LAB — T4, FREE: Free T4: 1.29 ng/dL (ref 0.80–1.80)

## 2014-05-24 MED ORDER — VALSARTAN 40 MG PO TABS: 40.0000 mg | ORAL_TABLET | Freq: Two times a day (BID) | ORAL | Status: DC

## 2014-05-24 MED ORDER — DIGOXIN 125 MCG PO TABS: 0.1250 mg | ORAL_TABLET | Freq: Every day | ORAL | Status: DC

## 2014-05-24 NOTE — Progress Notes (Signed)
6 minute Walk  Patient ambulated 1250 feet total with brisk pace and steady gait, no rest breaks needed.  Heart rate maintained in 80's while O2 sat on room air maintained high 90's. Patient tolerated very well.  Dr. Loralie Champagne made aware of results.  Renee Pain

## 2014-05-24 NOTE — Patient Instructions (Signed)
INCREASE Valsartan to 78m twice daily.  Follow up 2-3 weeks.  Do the following things EVERYDAY: 1) Weigh yourself in the morning before breakfast. Write it down and keep it in a log. 2) Take your medicines as prescribed 3) Eat low salt foods-Limit salt (sodium) to 2000 mg per day.  4) Stay as active as you can everyday 5) Limit all fluids for the day to less than 2 liters

## 2014-05-24 NOTE — Progress Notes (Signed)
Patient ID: Caleb [name], male   DOB: 1941-02-17, 74 y.o.   MRN: [id number] PCP: Dr. Sherren Mocha EP: [name]  74 yo with complex past history presents for heart failure evaluation.  Patient had anterior MI in 2004 and developed ischemic cardiomyopathy as well as mitral regurgitation. He also developed atrial fibrillation.  In 10/14, he had MV repair, Maze, CABG with LIMA-LAD, and LA appendage closure at [facility] in [place].  Subsequently, atrial fibrillation returned and he had an atrial fibrillation ablation in [facility] in 3/15.  He wore a Zio patch in 12/15 and had a low atrial fibrillation burden of 7%.  He has had a long-standing ischemic cardiomyopathy.  In 2015, EF was 20-25%.  Echo in 2016 also showed EF 25-30% but estimated PA pressure suggested severe pulmonary hypertension, which is new for him.    At initial appointment, he reported increased exertional dyspnea over a number of weeks.  RHC was done, showing mildly elevated PCWP with moderate pulmonary HTN and low cardiac index (1.93 thermo, 2.13 Fick).  V/Q scan showed no PE.  I started him on digoxin and have titrated his Lasix to 80 mg daily.  He is now on bisoprolol and able to tolerate it.  He seems to be feeling better with medication changes.  He now has more good days than bad days. He can walk 10-15 minutes before getting short of breath.  Palpitations have considerably decreased.  No orthopnea or PND.  No chest pain.   6 minute walk (3/16): 381 m   ECG: abnormal P wave axis (?ectopic atrial rhythm) with LVH with repolarization abnormality, QRS 120 msec.  Labs (2/15): LDL 144 Labs (8/15): K 4.6, creatinine 0.9 Labs (12/15): HCT 42.3   Labs (2/16): K 4, creatinine 1.05, BNP 268  PMH: 1. CAD: Anterior MI in 2004.  Cardiac surgery in 10/14 included LIMA-LAD.  2. Chronic mitral regurgitation: 10/14 surgery at [facility] with MV repair, Maze, LIMA-LAD, and LA appendage closure.  3. Atrial fibrillation: Paroxysmal.  H/o Maze  in 2014.  Had recurrent atrial fibrillation with ablation in 3/15 by Dr Ola Spurr in [facility].  Not anticoagulated after 2 severe prostate bleeding episodes.  - Zio patch in 12/15 with low atrial fibrillation burden (7%).  4. Chronotropic incompetence.  5. HTN 6. Hyperlipidemia: Refuses statin.  7. Peripheral neuropathy 8. GERD 9. H/o BPPV 10. H/o TIA 11. Ischemic cardiomyopathy: cardiac MRI 7/14 with EF 35%, moderate MR, normal RV size and systolic function, extensive anterior and anteroseptal LGE suggestive of non-viable myocardium (this was prior to LIMA-LAD).  Echo 1/15 with EF 20-25%, moderate AI, PA systolic pressure 39 mmHg. Echo (1/16) with EF 20-25%, diffuse hypokinesis with regionality, moderate LV dilation, moderate AI, s/p MV repair with mild MR and normal gradients, RV dilated with mildly decreased systolic function, PA systolic pressure 71 mmHg.  RHC (2/16) with mean RA 6, PA 61/25 mean 40, mean PCWP 23, CI 2.13/PVR 4.3 (Fick), CI 1.93/PVR 4.7 (thermo).   - Spironolactone apparently caused cognitive deficits - Intolerant of Coreg due to development of severe alopecia 12. Carotid dopplers (1/16) with 40-59% bilateral ICA stenosis.  13. Aortic insufficiency: Moderate by last echo 2/16.  14. Pulmonary HTN: Mixed PAH and pulmonary venous hypertension.  PFTs (9/14) were normal.  V/Q scan (2/16) with no evidence of acute or chronic PE.  6 minute walk (3/16) 381 m.   SH: Married, lives in [place], Careers information officer, nonsmoker  FH: CAD  ROS: All systems reviewed and  negative except as per HPI.   Current Outpatient Prescriptions  Medication Sig Dispense Refill  . Ascorbic Acid (VITAMIN C) 500 MG CAPS Take 2 capsules by mouth daily.    Marland Kitchen aspirin 81 MG chewable tablet Chew 81 mg by mouth daily.    . bisoprolol (ZEBETA) 5 MG tablet 1/2 tablet (2.79m ) daily (Patient taking differently: Take 2.5 mg by mouth daily. 1/2 tablet (2.553m) daily) 15 tablet 1  . furosemide (LASIX) 40  MG tablet Take 80 mg by mouth daily.     . Magnesium 100 MG CAPS Take 1 capsule by mouth 2 (two) times daily.     . nitroGLYCERIN (NITROSTAT) 0.4 MG SL tablet Place 0.4 mg under the tongue every 5 (five) minutes as needed. May repeat for up to 3 doses.     . Omega-3 Fatty Acids (THE VERY FINEST FISH OIL) LIQD Take 2-3 g by mouth daily.     . potassium chloride SA (K-DUR,KLOR-CON) 20 MEQ tablet Take 1 tablet by mouth daily.    . valsartan (DIOVAN) 40 MG tablet Take 1 tablet (40 mg total) by mouth 2 (two) times daily. 60 tablet 5  . Zinc 100 MG TABS Take 1 tablet by mouth daily.     . digoxin (LANOXIN) 0.125 MG tablet Take 1 tablet (0.125 mg total) by mouth daily. 90 tablet 3  . [DISCONTINUED] eplerenone (INSPRA) 25 MG tablet Take 1 tablet (25 mg total) by mouth daily. 30 tablet 11   No current facility-administered medications for this encounter.   BP 110/52 mmHg  Pulse 54  Wt 155 lb 6.4 oz (70.489 kg)  SpO2 99% General: NAD Neck: No JVD, no thyromegaly or thyroid nodule.  Lungs: Clear to auscultation bilaterally with normal respiratory effort. CV: Nondisplaced PMI.  Heart regular S1/S2, no S3/S4, 1/6 HSM apex, 2/6 diastolic murmur along the sternal border.  1+ edema left ankle, no edema on right.  No carotid bruit.  Normal pedal pulses.  Abdomen: Soft, nontender, no hepatosplenomegaly, no distention.  Skin: Intact without lesions or rashes.  Neurologic: Alert and oriented x 3.  Psych: Normal affect. Extremities: No clubbing or cyanosis.  HEENT: Normal.   Assessment/Plan: 1. CAD: This seems stable, no chest pain. S/p LIMA-LAD.  He is on ASA 81.  He has decided not to take statins after reviewing the data.   2. S/p mitral valve repair: The MV repair looked stable on recent echo with mild MR and no evidence for significant mitral stenosis.  3. Aortic insufficiency: Moderate on recent echo, similar to prior.  Diastolic murmur on exam.  4. Chronic systolic CHF: Ischemic cardiomyopathy, EF  20-25%.  This is similar to the past.  However, estimated PA pressure was higher on 1/16 echo than in past and symptoms have evolved, now NYHA class II-III.  RHC showed some elevation in PCWP and elevation in PA pressure out of proportion to the [facility] Card[facility] output was low.  I added digoxin and increased Lasix, symptoms now improved.  On exam today, he is not volume overloaded. - Continue digoxin, check level today.  - He has tolerated bisoprolol, continue 2.5 mg daily.  - BP is on the softer side.  I will hold off on starting Entresto for now.  I will increase valsartan to 40 mg bid. - He had ?cognitive problems with spironolactone.  I do think that we could try eplerenone in the future.  - Continue Lasix 80 mg daily and will check BMET today.   -  He does not want an ICD and understands the reasoning for having one.  - In the future, he could be an LVAD candidate. At next appointment, I am going to set him up for CPX.  - Followup in 2 wks with BMET.  - I would like him to consider doing cardiac rehab.  5. Atrial fibrillation: s/p Maze in 10/14, then ablation in 3/15. 7% atrial fibrillation on Zio patch in 12/15.  At last appointment, he felt like he was having frequent runs of atrial fibrillation.  This seems to have improved with less palpitations now.   - In the future, he could be a Tikosyn candidate if he has more atrial fibrillation.  - Currently, he is not anticoagulated due to history of prostate bleeding.  6. Pulmonary hypertension: Severe by echo (was mild on prior echo in 1/15), moderate by recent RHC.  Mixed pulmonary venous and pulmoary arterial HTN.  It is possible that the [facility] component is due to pulmonary vascular remodeling in the setting of chronic mitral regurgitation prior to MV repair. Negative V/Q scan, no evidence for CTEPH.  PFTs normal in 9/14.   - I will send collagen vascular workup: ANA, RF, anti-SCL-70.  Send HIV, TSH, ESR, CRP.  - If collagen vascular workup is  negative, I would initiate Adcirca 20 mg daily.  Will discuss this at next appt in 2 wks.  - He will need sleep study, will be seeing pulmonary to arrange this.   Followup with me in 2-3 wks.   Loralie Champagne 05/24/2014

## 2014-05-25 LAB — RHEUMATOID FACTOR: Rhuematoid fact SerPl-aCnc: 14.7 IU/mL — ABNORMAL HIGH (ref 0.0–13.9)

## 2014-05-25 LAB — HIV ANTIBODY (ROUTINE TESTING W REFLEX): HIV Screen 4th Generation wRfx: NONREACTIVE

## 2014-05-25 LAB — ANTI-SCLERODERMA ANTIBODY: SCLERODERMA (SCL-70) (ENA) ANTIBODY, IGG: NEGATIVE

## 2014-05-25 LAB — T3, FREE: T3, Free: 2.8 pg/mL (ref 2.0–4.4)

## 2014-06-04 DIAGNOSIS — I4891 Unspecified atrial fibrillation: Secondary | ICD-10-CM | POA: Diagnosis not present

## 2014-06-05 ENCOUNTER — Ambulatory Visit (INDEPENDENT_AMBULATORY_CARE_PROVIDER_SITE_OTHER): Payer: Medicare Other | Admitting: Pulmonary Disease

## 2014-06-05 ENCOUNTER — Encounter: Payer: Self-pay | Admitting: Pulmonary Disease

## 2014-06-05 VITALS — BP 110/62 | HR 56 | Ht 70.0 in | Wt 157.0 lb

## 2014-06-05 DIAGNOSIS — I272 Pulmonary hypertension, unspecified: Secondary | ICD-10-CM

## 2014-06-05 DIAGNOSIS — I270 Primary pulmonary hypertension: Secondary | ICD-10-CM | POA: Diagnosis not present

## 2014-06-05 DIAGNOSIS — I6523 Occlusion and stenosis of bilateral carotid arteries: Secondary | ICD-10-CM

## 2014-06-05 NOTE — Assessment & Plan Note (Signed)
Caleb [name] has been referred to my clinic for evaluation of pulmonary hypertension in the setting of long-standing ischemic cardiomyopathy and valvular disease. I have reviewed the records from his right heart catheterization which did show significant pulmonary hypertension which may in fact be out of proportion to his ischemic cardiomyopathy. Some experts feel that disproportionate pulmonary hypertension is reflective of a mean PA pressure greater than 20 mmHg greater than the pulmonary capillary wedge pressure, others feel that the diastolic PA pressure has to be 10 mmHg greater than the pulmonary capillary wedge pressure. In his particular case he does not meet either of these criteria, however he has had worsening findings on his echocardiogram and a significant worsening of symptoms in the last several months. So considering that I think it is not unreasonable to consider treatment because perhaps he is developing worsening pulmonary arteriopathy. If this is the case then we would expect to see some symptomatic improvement with the addition of a pulmonary vasodilator. We would like to see improvement and objective measures such as the 6 minute walk and perhaps a repeat right heart catheterization.  There was nothing in his lab work that stood out as as evidence of an underlying connective tissue disease with the exception of a mildly elevated rheumatoid factor (which I think is a false positive). I agree that he needs to have a polysomnogram.  Plan: -He will likely start with Adcirca per Dr. Marigene Ehlers this month, I agree with this plan -would follow serial 6 minute walk testing -if he has symptomatic improvement, would follow serial RHC at least for the first year to see objective evidence of improvement -advised on the side effect profile of Adcirca -would complete polysomnogram

## 2014-06-05 NOTE — Progress Notes (Signed)
Subjective:    Patient ID: Caleb [name], male    DOB: 1941/01/27, 74 y.o.   MRN: [id number]  HPI Chief Complaint  Patient presents with  . Pulmonary Consult    referred by Dr. Joya Gaskins for [facility].   [name] is here to see me for evaluation of his pulmonary hyperteson.  He has a lengthy history of CHF secdondary to vavluar disase and ischeamic cardiac disease treated with vavluar surgery and CABG in [place] in the past.   He says tht in December 2015 he noted increasing dyspnea which was a relatively new problem for him.  He had completed cardiac rehab in 2015 and said that he was doing quite well. However in December 2015 he started to notice the fairly abrupt onset of increasing dyspnea. He said that this was "a sign wave pattern" in that his symptoms would come and go. During this time he was evaluated by cardiology and had a right heart catheterization which showed clear evidence of pulmonary hypertension in addition to his elevated pulmonary capillary wedge pressure. His diuretic medications were adjusted and he started seeing a naturopath physician at this time. He says that since having his diuretic medications adjusted his energy level has improved significantly and his shortness of breath has improved. However, something is a bit different than it was back in 2015.  He has had lung function testing in the past which did not show clear evidence of underlying lung disease. He had a VQ scan recently which did not show evidence of underlying pulmonary vascular obstruction.  He has been referred to my clinic for further evaluation of pulmonary hypertension and consideration for pulmonary vasodilator treatment.  Past Medical History  Diagnosis Date  . CAD (coronary artery disease)   . Atrial fibrillation   . Cardiomyopathy, ischemic   . Heart failure, systolic, acute on chronic   . Hypertension   . Hyperlipidemia   . Pleural effusion   . Positional vertigo   . Dizziness   . GERD  (gastroesophageal reflux disease)   . Atypical pneumonia   . Cough   . Allergic rhinitis   . TIA (transient ischemic attack)   . OSA (obstructive sleep apnea)     Home sleep test 07/05/2009 AHI 8.2  . Chronic anticoagulation      Family History  Problem Relation Age of Onset  . Adopted: Yes  . Diabetes      [place]  . Heart disease Neg Hx   . Hypertension Neg Hx      History   Social History  . Marital Status: Married    Spouse Name: N/A  . Number of Children: N/A  . Years of Education: N/A   Occupational History  . PHYSICIAN     Psychologist   Social History Main Topics  . Smoking status: Never Smoker   . Smokeless tobacco: Never Used  . Alcohol Use: No  . Drug Use: No  . Sexual Activity: Not on file   Other Topics Concern  . Not on file   Social History Narrative   Married   Gets regular exercise     Allergies  Allergen Reactions  . Carvedilol     Dizziness, uneasiness, alopecia  . Sulfonamide Derivatives     REACTION: rash  . Pradaxa [Dabigatran Etexilate Mesylate] Rash  . Xarelto [Rivaroxaban] Rash     Outpatient Prescriptions Prior to Visit  Medication Sig Dispense Refill  . Ascorbic Acid (VITAMIN C) 500 MG CAPS Take 2 capsules by  mouth daily.    Marland Kitchen aspirin 81 MG chewable tablet Chew 81 mg by mouth daily.    . bisoprolol (ZEBETA) 5 MG tablet 1/2 tablet (2.80m ) daily (Patient taking differently: Take 2.5 mg by mouth daily. 1/2 tablet (2.55m) daily) 15 tablet 1  . digoxin (LANOXIN) 0.125 MG tablet Take 1 tablet (0.125 mg total) by mouth daily. 90 tablet 3  . Magnesium 100 MG CAPS Take 1 capsule by mouth 2 (two) times daily.     . nitroGLYCERIN (NITROSTAT) 0.4 MG SL tablet Place 0.4 mg under the tongue every 5 (five) minutes as needed. May repeat for up to 3 doses.     . Omega-3 Fatty Acids (THE VERY FINEST FISH OIL) LIQD Take 2-3 g by mouth daily.     . potassium chloride SA (K-DUR,KLOR-CON) 20 MEQ tablet Take 1 tablet by mouth daily.    . valsartan  (DIOVAN) 40 MG tablet Take 1 tablet (40 mg total) by mouth 2 (two) times daily. (Patient taking differently: Take 60 mg by mouth 2 (two) times daily. ) 60 tablet 5  . Zinc 100 MG TABS Take 1 tablet by mouth daily.     . furosemide (LASIX) 40 MG tablet Take 80 mg by mouth daily.      No facility-administered medications prior to visit.       Review of Systems  Constitutional: Negative for fever and unexpected weight change.  HENT: Positive for postnasal drip and rhinorrhea. Negative for congestion, dental problem, ear pain, nosebleeds, sinus pressure, sneezing, sore throat and trouble swallowing.   Eyes: Negative for redness and itching.  Respiratory: Positive for cough and shortness of breath. Negative for chest tightness and wheezing.   Cardiovascular: Positive for palpitations and leg swelling.  Gastrointestinal: Negative for nausea and vomiting.  Genitourinary: Negative for dysuria.  Musculoskeletal: Negative for joint swelling.  Skin: Negative for rash.  Neurological: Negative for headaches.  Hematological: Bruises/bleeds easily.  Psychiatric/Behavioral: Negative for dysphoric mood. The patient is nervous/anxious.        Objective:   Physical Exam Filed Vitals:   06/05/14 1325  BP: 110/62  Pulse: 56  Height: _0  (1.778 m)  Weight: 157 lb (71.215 kg)  SpO2: 96%   RA  Gen: well appearing, no acute distress HEENT: NCAT, PERRL, EOMi, OP clear, neck supple without masses PULM: CTA B CV: RRR, systolic murmur, no JVD AB: BS+, soft, nontender, no hsm Ext: warm, no edema, no clubbing, no cyanosis Derm: no rash or skin breakdown Neuro: A&Ox4, CN II-XII intact, strength 5/5 in all 4 extremities  Recent cardiology records and right heart catheterization records reviewed labwork reviewed      Assessment & Plan:   Pulmonary hypertension Mr. GrBartlesonas been referred to my clinic for evaluation of pulmonary hypertension in the setting of long-standing ischemic  cardiomyopathy and valvular disease. I have reviewed the records from his right heart catheterization which did show significant pulmonary hypertension which may in fact be out of proportion to his ischemic cardiomyopathy. Some experts feel that disproportionate pulmonary hypertension is reflective of a mean PA pressure greater than 20 mmHg greater than the pulmonary capillary wedge pressure, others feel that the diastolic PA pressure has to be 10 mmHg greater than the pulmonary capillary wedge pressure. In his particular case he does not meet either of these criteria, however he has had worsening findings on his echocardiogram and a significant worsening of symptoms in the last several months. So considering that I think it  is not unreasonable to consider treatment because perhaps he is developing worsening pulmonary arteriopathy. If this is the case then we would expect to see some symptomatic improvement with the addition of a pulmonary vasodilator. We would like to see improvement and objective measures such as the 6 minute walk and perhaps a repeat right heart catheterization.  There was nothing in his lab work that stood out as as evidence of an underlying connective tissue disease with the exception of a mildly elevated rheumatoid factor (which I think is a false positive). I agree that he needs to have a polysomnogram.  Plan: -He will likely start with Adcirca per Dr. Marigene Ehlers this month, I agree with this plan -would follow serial 6 minute walk testing -if he has symptomatic improvement, would follow serial RHC at least for the first year to see objective evidence of improvement -advised on the side effect profile of Adcirca -would complete polysomnogram     Updated Medication List Outpatient Encounter Prescriptions as of 06/05/2014  Medication Sig  . Ascorbic Acid (VITAMIN C) 500 MG CAPS Take 2 capsules by mouth daily.  Marland Kitchen aspirin 81 MG chewable tablet Chew 81 mg by mouth daily.  .  bisoprolol (ZEBETA) 5 MG tablet 1/2 tablet (2.43m ) daily (Patient taking differently: Take 2.5 mg by mouth daily. 1/2 tablet (2.574m) daily)  . digoxin (LANOXIN) 0.125 MG tablet Take 1 tablet (0.125 mg total) by mouth daily.  . furosemide (LASIX) 80 MG tablet Take 80 mg by mouth daily.  . Magnesium 100 MG CAPS Take 1 capsule by mouth 2 (two) times daily.   . nitroGLYCERIN (NITROSTAT) 0.4 MG SL tablet Place 0.4 mg under the tongue every 5 (five) minutes as needed. May repeat for up to 3 doses.   . Omega-3 Fatty Acids (THE VERY FINEST FISH OIL) LIQD Take 2-3 g by mouth daily.   . potassium chloride SA (K-DUR,KLOR-CON) 20 MEQ tablet Take 1 tablet by mouth daily.  . valsartan (DIOVAN) 40 MG tablet Take 1 tablet (40 mg total) by mouth 2 (two) times daily. (Patient taking differently: Take 60 mg by mouth 2 (two) times daily. )  . Zinc 100 MG TABS Take 1 tablet by mouth daily.   . [DISCONTINUED] furosemide (LASIX) 40 MG tablet Take 80 mg by mouth daily.

## 2014-06-05 NOTE — Patient Instructions (Signed)
I recommend that you continue with the plan outlined by [name] for your heart failure and the pulmonary hypertension I am happy to see you back for pulmonary hypertension or your general pulmonary needs, but for now plan to follow up with Dr. Joya Gaskins

## 2014-06-05 NOTE — Progress Notes (Signed)
I would recommend that he start Adcirca 20 mg daily.  If he is ok with this, could go ahead and get paperwork started. Please call.

## 2014-06-12 ENCOUNTER — Ambulatory Visit (HOSPITAL_COMMUNITY)
Admission: RE | Admit: 2014-06-12 | Discharge: 2014-06-12 | Disposition: A | Discharge status: Home / Self Care | Payer: Medicare Other | Source: Ambulatory Visit | Attending: Cardiology | Admitting: Cardiology

## 2014-06-12 VITALS — BP 110/52 | HR 56 | Wt 157.0 lb

## 2014-06-12 DIAGNOSIS — Z951 Presence of aortocoronary bypass graft: Secondary | ICD-10-CM

## 2014-06-12 DIAGNOSIS — E785 Hyperlipidemia, unspecified: Secondary | ICD-10-CM | POA: Insufficient documentation

## 2014-06-12 DIAGNOSIS — R5383 Other fatigue: Secondary | ICD-10-CM

## 2014-06-12 DIAGNOSIS — I4891 Unspecified atrial fibrillation: Secondary | ICD-10-CM | POA: Diagnosis not present

## 2014-06-12 DIAGNOSIS — I5032 Chronic diastolic (congestive) heart failure: Secondary | ICD-10-CM | POA: Diagnosis not present

## 2014-06-12 DIAGNOSIS — I272 Other secondary pulmonary hypertension: Secondary | ICD-10-CM | POA: Insufficient documentation

## 2014-06-12 DIAGNOSIS — I270 Primary pulmonary hypertension: Secondary | ICD-10-CM | POA: Diagnosis not present

## 2014-06-12 DIAGNOSIS — I252 Old myocardial infarction: Secondary | ICD-10-CM | POA: Diagnosis not present

## 2014-06-12 DIAGNOSIS — R0600 Dyspnea, unspecified: Secondary | ICD-10-CM

## 2014-06-12 DIAGNOSIS — Z8639 Personal history of other endocrine, nutritional and metabolic disease: Secondary | ICD-10-CM

## 2014-06-12 DIAGNOSIS — Z9889 Other specified postprocedural states: Secondary | ICD-10-CM

## 2014-06-12 DIAGNOSIS — R002 Palpitations: Secondary | ICD-10-CM | POA: Insufficient documentation

## 2014-06-12 DIAGNOSIS — Z79899 Other long term (current) drug therapy: Secondary | ICD-10-CM | POA: Insufficient documentation

## 2014-06-12 DIAGNOSIS — I6523 Occlusion and stenosis of bilateral carotid arteries: Secondary | ICD-10-CM | POA: Diagnosis not present

## 2014-06-12 DIAGNOSIS — Z8673 Personal history of transient ischemic attack (TIA), and cerebral infarction without residual deficits: Secondary | ICD-10-CM | POA: Diagnosis not present

## 2014-06-12 DIAGNOSIS — E538 Deficiency of other specified B group vitamins: Secondary | ICD-10-CM

## 2014-06-12 DIAGNOSIS — I10 Essential (primary) hypertension: Secondary | ICD-10-CM | POA: Diagnosis not present

## 2014-06-12 DIAGNOSIS — I2510 Atherosclerotic heart disease of native coronary artery without angina pectoris: Secondary | ICD-10-CM | POA: Insufficient documentation

## 2014-06-12 DIAGNOSIS — Z7982 Long term (current) use of aspirin: Secondary | ICD-10-CM | POA: Insufficient documentation

## 2014-06-12 DIAGNOSIS — I5022 Chronic systolic (congestive) heart failure: Secondary | ICD-10-CM | POA: Insufficient documentation

## 2014-06-12 DIAGNOSIS — K219 Gastro-esophageal reflux disease without esophagitis: Secondary | ICD-10-CM | POA: Insufficient documentation

## 2014-06-12 DIAGNOSIS — Z5181 Encounter for therapeutic drug level monitoring: Secondary | ICD-10-CM

## 2014-06-12 DIAGNOSIS — R0602 Shortness of breath: Secondary | ICD-10-CM

## 2014-06-12 DIAGNOSIS — I255 Ischemic cardiomyopathy: Secondary | ICD-10-CM | POA: Diagnosis not present

## 2014-06-12 DIAGNOSIS — Z8489 Family history of other specified conditions: Secondary | ICD-10-CM

## 2014-06-12 DIAGNOSIS — Z8349 Family history of other endocrine, nutritional and metabolic diseases: Secondary | ICD-10-CM

## 2014-06-12 DIAGNOSIS — I480 Paroxysmal atrial fibrillation: Secondary | ICD-10-CM | POA: Diagnosis not present

## 2014-06-12 LAB — BASIC METABOLIC PANEL
ANION GAP: 9 (ref 5–15)
BUN: 16 mg/dL (ref 6–23)
CALCIUM: 9.7 mg/dL (ref 8.4–10.5)
CO2: 28 mmol/L (ref 19–32)
CREATININE: 0.91 mg/dL (ref 0.50–1.35)
Chloride: 99 mmol/L (ref 96–112)
GFR calc non Af Amer: 81 mL/min — ABNORMAL LOW (ref >90)
Glucose, Bld: 73 mg/dL (ref 70–99)
Potassium: 4.4 mmol/L (ref 3.5–5.1)
SODIUM: 136 mmol/L (ref 135–145)

## 2014-06-12 LAB — BRAIN NATRIURETIC PEPTIDE: B Natriuretic Peptide: 320.1 pg/mL — ABNORMAL HIGH (ref 0.0–100.0)

## 2014-06-12 MED ORDER — TADALAFIL (PAH) 20 MG PO TABS: 20.0000 mg | ORAL_TABLET | Freq: Every day | ORAL | Status: DC

## 2014-06-12 NOTE — Progress Notes (Signed)
Patient ID: Caleb [name], male   DOB: 1940-06-15, 74 y.o.   MRN: [id number] PCP: Dr. Sherren Mocha EP: [name]  74 yo with complex past history presents for heart failure evaluation.  Patient had anterior MI in 2004 and developed ischemic cardiomyopathy as well as mitral regurgitation. He also developed atrial fibrillation.  In 10/14, he had MV repair, Maze, CABG with LIMA-LAD, and LA appendage closure at [facility] in [place].  Subsequently, atrial fibrillation returned and he had an atrial fibrillation ablation in [facility] in 3/15.  He wore a Zio patch in 12/15 and had a low atrial fibrillation burden of 7%.  He has had a long-standing ischemic cardiomyopathy.  In 2015, EF was 20-25%.  Echo in 2016 also showed EF 25-30% but estimated PA pressure suggested severe pulmonary hypertension, which is new for him.    At initial appointment, he reported increased exertional dyspnea over a number of weeks.  RHC was done, showing mildly elevated PCWP with moderate pulmonary HTN and low cardiac index (1.93 thermo, 2.13 Fick).  V/Q scan showed no PE.  I started him on digoxin and have titrated his Lasix to 80 mg daily.  He is now on bisoprolol and able to tolerate it.  At last appointment, I tried to increase his valsartan to 40 bid, but he felt fatigued and lightheaded so has decreased it to 40 qam, 20 qpm.  He can tolerate this.  He now has more good days than bad days. He can walk 15 minutes before getting short of breath.  He can ride an exercise bicycle without problems.  Palpitations have considerably decreased.  No orthopnea or PND.  No chest pain. He does get tired in the evenings.   He saw Dr Lake Bells with pulmonary recently, and he has been set up for a sleep study.   6 minute walk (3/16): 381 m   ECG: abnormal P wave axis (?ectopic atrial rhythm) with LVH with repolarization abnormality, QRS 120 msec.  Labs (2/15): LDL 144 Labs (8/15): K 4.6, creatinine 0.9 Labs (12/15): HCT 42.3   Labs (2/16): K 4  => 4.2, creatinine 1.05 => 0.92, BNP 268 Labs (3/16): BNP 495, digoxin 0.4, RF 14.7 (very mild increase), TSH normal, HIV negative, anti-SCL70 negative  PMH: 1. CAD: Anterior MI in 2004.  Cardiac surgery in 10/14 included LIMA-LAD.  2. Chronic mitral regurgitation: 10/14 surgery at [facility] with MV repair, Maze, LIMA-LAD, and LA appendage closure.  3. Atrial fibrillation: Paroxysmal.  H/o Maze in 2014.  Had recurrent atrial fibrillation with ablation in 3/15 by Dr Ola Spurr in [facility].  Not anticoagulated after 2 severe prostate bleeding episodes.  - Zio patch in 12/15 with low atrial fibrillation burden (7%).  4. Chronotropic incompetence.  5. HTN 6. Hyperlipidemia: Refuses statin.  7. Peripheral neuropathy 8. GERD 9. H/o BPPV 10. H/o TIA 11. Ischemic cardiomyopathy: cardiac MRI 7/14 with EF 35%, moderate MR, normal RV size and systolic function, extensive anterior and anteroseptal LGE suggestive of non-viable myocardium (this was prior to LIMA-LAD).  Echo 1/15 with EF 20-25%, moderate AI, PA systolic pressure 39 mmHg. Echo (1/16) with EF 20-25%, diffuse hypokinesis with regionality, moderate LV dilation, moderate AI, s/p MV repair with mild MR and normal gradients, RV dilated with mildly decreased systolic function, PA systolic pressure 71 mmHg.  RHC (2/16) with mean RA 6, PA 61/25 mean 40, mean PCWP 23, CI 2.13/PVR 4.3 (Fick), CI 1.93/PVR 4.7 (thermo).   - Spironolactone apparently caused cognitive deficits - Intolerant  of Coreg due to development of severe alopecia 12. Carotid dopplers (1/16) with 40-59% bilateral ICA stenosis.  13. Aortic insufficiency: Moderate by last echo 2/16.  14. Pulmonary HTN: Mixed PAH and pulmonary venous hypertension.  PFTs (9/14) were normal.  V/Q scan (2/16) with no evidence of acute or chronic PE.  6 minute walk (3/16) 381 m.   SH: Married, lives in [place], Careers information officer, nonsmoker  FH: CAD  ROS: All systems reviewed and negative  except as per HPI.   Current Outpatient Prescriptions  Medication Sig Dispense Refill  . Ascorbic Acid (VITAMIN C) 500 MG CAPS Take 2 capsules by mouth daily.    Marland Kitchen aspirin 81 MG chewable tablet Chew 81 mg by mouth daily.    . bisoprolol (ZEBETA) 5 MG tablet 1/2 tablet (2.61m ) daily (Patient taking differently: Take 2.5 mg by mouth daily. 1/2 tablet (2.569m) daily) 15 tablet 1  . digoxin (LANOXIN) 0.125 MG tablet Take 1 tablet (0.125 mg total) by mouth daily. 90 tablet 3  . furosemide (LASIX) 80 MG tablet Take 80 mg by mouth daily.    . Magnesium 100 MG CAPS Take 1 capsule by mouth 2 (two) times daily.     . nitroGLYCERIN (NITROSTAT) 0.4 MG SL tablet Place 0.4 mg under the tongue every 5 (five) minutes as needed. May repeat for up to 3 doses.     . Omega-3 Fatty Acids (THE VERY FINEST FISH OIL) LIQD Take 2-3 g by mouth daily.     . potassium chloride SA (K-DUR,KLOR-CON) 20 MEQ tablet Take 1 tablet by mouth daily.    . valsartan (DIOVAN) 40 MG tablet 40 mg in the AM and 20 mg in the PM    . Zinc 100 MG TABS Take 1 tablet by mouth daily.     . Tadalafil, PAH, (ADCIRCA) 20 MG TABS Take 1 tablet (20 mg total) by mouth daily. 60 tablet 6  . [DISCONTINUED] eplerenone (INSPRA) 25 MG tablet Take 1 tablet (25 mg total) by mouth daily. 30 tablet 11   No current facility-administered medications for this encounter.   BP 110/52 mmHg  Pulse 56  Wt 157 lb (71.215 kg)  SpO2 97% General: NAD Neck: No JVD, no thyromegaly or thyroid nodule.  Lungs: Clear to auscultation bilaterally with normal respiratory effort. CV: Nondisplaced PMI.  Heart regular S1/S2, no S3/S4, 1/6 HSM apex, 2/6 diastolic murmur along the sternal border.  1+ edema left ankle, no edema on right.  No carotid bruit.  Normal pedal pulses.  Abdomen: Soft, nontender, no hepatosplenomegaly, no distention.  Skin: Intact without lesions or rashes.  Neurologic: Alert and oriented x 3.  Psych: Normal affect. Extremities: No clubbing or  cyanosis.  HEENT: Normal.   Assessment/Plan: 1. CAD: This seems stable, no chest pain. S/p LIMA-LAD.  He is on ASA 81.  He has decided not to take statins after reviewing the data.   2. S/p mitral valve repair: The MV repair looked stable on recent echo with mild MR and no evidence for significant mitral stenosis.  3. Aortic insufficiency: Moderate on recent echo, similar to prior.  Diastolic murmur on exam.  4. Chronic systolic CHF: Ischemic cardiomyopathy, EF 20-25%.  This is similar to the past.  However, estimated PA pressure was higher on 1/16 echo than in past and symptoms have evolved, now NYHA class II-III.  RHC showed some elevation in PCWP and elevation in PA pressure out of proportion to the [facility].  I added digoxin and increased Lasix, symptoms now improved.  On exam today, he is not volume overloaded. - Continue digoxin, recent level ok.  - He has tolerated bisoprolol, continue 2.5 mg daily.  - Continue valsartan 40 qam/20 qpm (did not tolerate 40 bid).   - He had ?cognitive problems with spironolactone.  I do think that we could try eplerenone in the future => will be next step after Adcirca.   - Continue Lasix 80 mg daily and will check BMET/BNP today.   - He does not want an ICD and understands the reasoning for having one.  - In the future, he could be an LVAD candidate. I am going to arrange for CPX to be done prior to next appt.  - Followup in 6 wks.  5. Atrial fibrillation: s/p Maze in 10/14, then ablation in 3/15. 7% atrial fibrillation on Zio patch in 12/15.  At last appointment, he felt like he was having frequent runs of atrial fibrillation.  This seems to have improved with less palpitations now.   - In the future, he could be a Tikosyn candidate if he has more atrial fibrillation.  - Currently, he is not anticoagulated due to history of prostate bleeding.  6. Pulmonary hypertension: Severe by echo (was mild on prior echo in 1/15), moderate by recent  RHC.  Mixed pulmonary venous and pulmoary arterial HTN.  It is possible that the [facility] component is due to pulmonary vascular remodeling in the setting of chronic mitral regurgitation prior to MV repair. Negative V/Q scan, no evidence for CTEPH.  PFTs normal in 9/14.  He is seeing Dr Lake Bells. - We will do a trial of Adcirca 20 mg daily.  I will see him back in 6 wks for 6 minute walk.  If no symptomatic improvement, he could stop it.  - Sleep study arranged for April.   Followup with me in 6 wks  Loralie Champagne 06/12/2014

## 2014-06-12 NOTE — Patient Instructions (Signed)
Start Adcirca 20 mg daily, we have sent the prescription to [facility], they will contact you before sending the medication  Labs today  Your physician has recommended that you have a cardiopulmonary stress test (CPX). CPX testing is a non-invasive measurement of heart and lung function. It replaces a traditional treadmill stress test. This type of test provides a tremendous amount of information that relates not only to your present condition but also for future outcomes. This test combines measurements of you ventilation, respiratory gas exchange in the lungs, electrocardiogram (EKG), blood pressure and physical response before, during, and following an exercise protocol.  Your physician recommends that you schedule a follow-up appointment in: 6 weeks

## 2014-06-19 ENCOUNTER — Telehealth (HOSPITAL_COMMUNITY): Payer: Self-pay | Admitting: Vascular Surgery

## 2014-06-19 NOTE — Telephone Encounter (Signed)
[facility] called they would like to know the status of the prior auth form for this pt.. Please advise

## 2014-06-20 NOTE — Telephone Encounter (Signed)
PA is pending, will forward to person responsible/ working on [place]

## 2014-06-22 NOTE — Telephone Encounter (Signed)
There was error on the paperwork sent into the [facility], it was marked for Revatio by mistake, have called [facility] and gave order to cancel the Revation and gave verbal order for Adcirca 20 mg daily, they will submit order and let us know if PA is needed

## 2014-06-26 ENCOUNTER — Ambulatory Visit: Payer: Self-pay | Admitting: Cardiology

## 2014-06-26 DIAGNOSIS — Z5181 Encounter for therapeutic drug level monitoring: Secondary | ICD-10-CM

## 2014-06-26 DIAGNOSIS — I4891 Unspecified atrial fibrillation: Secondary | ICD-10-CM

## 2014-06-28 ENCOUNTER — Encounter: Payer: Self-pay | Admitting: Cardiovascular Disease

## 2014-06-28 ENCOUNTER — Ambulatory Visit (INDEPENDENT_AMBULATORY_CARE_PROVIDER_SITE_OTHER): Payer: Medicare Other | Admitting: Cardiovascular Disease

## 2014-06-28 VITALS — BP 130/58 | HR 52 | Ht 70.0 in | Wt 158.6 lb

## 2014-06-28 DIAGNOSIS — I351 Nonrheumatic aortic (valve) insufficiency: Secondary | ICD-10-CM | POA: Diagnosis not present

## 2014-06-28 DIAGNOSIS — I6523 Occlusion and stenosis of bilateral carotid arteries: Secondary | ICD-10-CM | POA: Diagnosis not present

## 2014-06-28 NOTE — Progress Notes (Signed)
Cardiology Office Note   Date:  06/28/2014   ID:  Caleb [name], Caleb [name] 01/01/1941, MRN [id number]  PCP:  Joycelyn Man, MD  Cardiologist:  Sherren Mocha, MD    Chief Complaint  Patient presents with  . Shortness of Breath   History of Present Illness: Caleb [name] is a 74 y.o. male who presents for follow-up evaluation. Patient has a complex cardiovascular history. Patient had anterior MI in 2004 and developed ischemic cardiomyopathy as well as mitral regurgitation. He also developed atrial fibrillation. In 10/14, he had MV repair, Maze, CABG with LIMA-LAD, aortic root replacement, and LA appendage closure at [facility] in [place]. Subsequently, atrial fibrillation returned and he had an atrial fibrillation ablation in [facility] in 3/15. He wore a Zio patch in 12/15 and had a low atrial fibrillation burden of 7%. He has had a long-standing ischemic cardiomyopathy. In 2015, EF was 20-25%. Echo in 2016 also showed EF 25-30% but estimated PA pressure suggested severe pulmonary hypertension. He underwent right heart catheterization demonstrating low cardiac output, elevated ulnar capillary wedge pressure, and moderate pulmonary hypertension. He has been followed closely by [name] in the congestive heart failure clinic.  He is slowly feeling better. Still has fatigue but has appreciated some improvement. His shortness of breath is markedly improved over recent weeks. He still reports 'good days and bad days.'  He is working through [employer] issues trying to obtain daily tadalafil for pulmonary HTN. Has done extensive research in stem cell therapies and is considering enrolling in a clinical trial.   Past Medical History  Diagnosis Date  . CAD (coronary artery disease)   . Atrial fibrillation   . Cardiomyopathy, ischemic   . Heart failure, systolic, acute on chronic   . Hypertension   . Hyperlipidemia   . Pleural effusion   . Positional vertigo   . Dizziness     . GERD (gastroesophageal reflux disease)   . Atypical pneumonia   . Cough   . Allergic rhinitis   . TIA (transient ischemic attack)   . OSA (obstructive sleep apnea)     Home sleep test 07/05/2009 AHI 8.2  . Chronic anticoagulation     Past Surgical History  Procedure Laterality Date  . Coronary stent placement  2004    LAD  . Shoulder surgery    . Tee without cardioversion N/A 09/21/2012    Procedure: TRANSESOPHAGEAL ECHOCARDIOGRAM (TEE);  Surgeon: Larey Dresser, MD;  Location: [facility] ENDOSCOPY;  Service: Cardiovascular;  Laterality: N/A;  . Coronary artery bypass graft  01/09/13    LAD LIMA, left atrial appendage  . Mitral valve annuloplasty  01/09/13  . Aortic valve repair  01/09/13  . Right heart catheterization N/A 05/11/2014    Procedure: RIGHT HEART CATH;  Surgeon: Larey Dresser, MD;  Location: [facility] CATH LAB;  Service: Cardiovascular;  Laterality: N/A;    Current Outpatient Prescriptions  Medication Sig Dispense Refill  . Ascorbic Acid (VITAMIN C) 500 MG CAPS Take 2 capsules by mouth daily.    Marland Kitchen aspirin 81 MG chewable tablet Chew 81 mg by mouth daily.    . bisoprolol (ZEBETA) 5 MG tablet Take 5 mg by mouth daily. Patient taking one half tablet (2.5 mg) by mouth daily    . digoxin (LANOXIN) 0.125 MG tablet Take 1 tablet (0.125 mg total) by mouth daily. 90 tablet 3  . furosemide (LASIX) 80 MG tablet Take 80 mg by mouth daily.    . Magnesium 100 MG  CAPS Take 1 capsule by mouth 2 (two) times daily.     . nitroGLYCERIN (NITROSTAT) 0.4 MG SL tablet Place 0.4 mg under the tongue every 5 (five) minutes as needed. May repeat for up to 3 doses.     . Omega-3 Fatty Acids (THE VERY FINEST FISH OIL) LIQD Take 2-3 g by mouth daily.     . potassium chloride SA (K-DUR,KLOR-CON) 20 MEQ tablet Take 1 tablet by mouth daily.    . Tadalafil, PAH, (ADCIRCA) 20 MG TABS Take 1 tablet (20 mg total) by mouth daily. 60 tablet 6  . valsartan (DIOVAN) 40 MG tablet 40 mg in the AM and 20 mg in the PM     . Zinc 100 MG TABS Take 1 tablet by mouth daily.     . [DISCONTINUED] eplerenone (INSPRA) 25 MG tablet Take 1 tablet (25 mg total) by mouth daily. 30 tablet 11   No current facility-administered medications for this visit.    Allergies:   Carvedilol; Pradaxa; Sulfonamide derivatives; and Xarelto   Social History:  The patient  reports that he has never smoked. He has never used smokeless tobacco. He reports that he does not drink alcohol or use illicit drugs.   Family History:  The patient's family history includes Diabetes in an other family member. There is no history of Heart disease or Hypertension. He was adopted.    ROS:  Please see the history of present illness.  Otherwise, review of systems is positive for leg swelling, easy bruising, and excessive fatigue.  All other systems are reviewed and negative.    PHYSICAL EXAM: VS:  BP 130/58 mmHg  Pulse 52  Ht _0  (1.778 m)  Wt 158 lb 9.6 oz (71.94 kg)  BMI 22.76 kg/m2  SpO2 97% , BMI Body mass index is 22.76 kg/(m^2). GEN: Well nourished, well developed, in no acute distress HEENT: normal Neck: no JVD, no masses. No carotid bruits Cardiac: RRR without murmur or gallop                Respiratory:  clear to auscultation bilaterally, normal work of breathing GI: soft, nontender, nondistended, + BS MS: no deformity or atrophy Ext: no pretibial edema, pedal pulses 2+= bilaterally Skin: warm and dry, no rash Neuro:  Strength and sensation are intact Psych: euthymic mood, full affect  EKG:  EKG is not ordered today.  Recent Labs: 05/07/2014: Pro B Natriuretic peptide (BNP) 268.0* 05/11/2014: Hemoglobin 14.8; Platelets 189 05/24/2014: Magnesium 2.2; TSH 2.010 06/12/2014: B Natriuretic Peptide 320.1*; BUN 16; Creatinine 0.91; Potassium 4.4; Sodium 136   Lipid Panel     Component Value Date/Time   CHOL 225* 05/11/2013 1003   TRIG 39.0 05/11/2013 1003   HDL 72.60 05/11/2013 1003   CHOLHDL 3 05/11/2013 1003   VLDL 7.8  05/11/2013 1003   LDLCALC 122* 04/19/2008 0927   LDLDIRECT 143.9 05/11/2013 1003      Wt Readings from Last 3 Encounters:  06/28/14 158 lb 9.6 oz (71.94 kg)  06/12/14 157 lb (71.215 kg)  06/05/14 157 lb (71.215 kg)     Cardiac Studies Reviewed: 2-D echocardiogram 04/19/2014: Study Conclusions  - Left ventricle: There is diffuse hypokineisis with mid anterior, anteroseptal, and anterolateral walls akinesis. All apical segments are dyskinetic. No obvious thrombus is seen in the LV apex. The cavity size was moderately dilated. Systolic function was severely reduced. The estimated ejection fraction was in the range of 20% to 25%. The study is not technically sufficient to allow  evaluation of LV diastolic function. - Aortic valve: Trileaflet; normal thickness leaflets. Transvalvular velocity was within the normal range. There was no stenosis. There was moderate regurgitation. Valve area (VTI): 1.78 cm^2. Valve area (Vmax): 1.88 cm^2. Valve area (Vmean): 1.74 cm^2. - Mitral valve: S/P mitral valve repair with normal transmitral gradients and only mild regurgitation. There was mild regurgitation. Valve area by continuity equation (using LVOT flow): 1.31 cm^2. - Left atrium: The atrium was mildly dilated. - Right ventricle: The cavity size was mildly dilated. Wall thickness was normal. Systolic function was mildly reduced. - Right atrium: The atrium was moderately to severely dilated. - Pulmonary arteries: Systolic pressure was severely increased. PA peak pressure: 71 mm Hg (S).  Impressions:  - Compared to the prior study from 04/14/2013 the LVEF is stable, however pulmonary hypertension is now severe (previosuly mild).  ASSESSMENT AND PLAN: 1.  Chronic systolic heart failure, [place] heart association functional class 2: the patient appears to be doing well on his current medical program. He will start tadalafil in the near future for pulmonary  hypertension. He continues on valsartan, furosemide, digoxin, and bisoprolol. He is followed closely in the advanced heart failure clinic by [name]. I will see him back as needed.  2. Coronary artery disease, native vessel: The patient is status post LIMA to LAD grafting. He did not have significant improvement after revascularization with a LIMA to LAD graft. Suspect this is because he had suffered a large anterior infarct in the past. He has no symptoms of angina and will continue on daily aspirin.  3. Moderate aortic insufficiency: this will be followed with serial echocardiography.  4. Status post mitral valve repair: intact repair by recent echo with mild residual MR.  5. Paroxysmal atrial fibrillation: followed by Dr. Ola Spurr. He has not been inclined to take chronic oral anticoagulation. There has been extensive discussion past.  6. Pulmonary hypertension, suspect secondary to chronic left heart disease (valvular and myocardial).  Current medicines are reviewed with the patient today.  The patient does not have concerns regarding medicines.  The following changes have been made:  no change  Labs/ tests ordered today include:  No orders of the defined types were placed in this encounter.    Disposition:   As he has multiple cardiology providers Aundra Dubin - CHF, Caryl Comes and Ola Spurr - EP), he will FU with me as needed  Signed, Sherren Mocha, MD  06/28/2014 10:23 PM    [facility] [address], [place], [place]  [address] Phone: [phone]; Fax: [phone]

## 2014-06-28 NOTE — Patient Instructions (Signed)
Please call the office if you have any other questions or concerns.

## 2014-06-29 ENCOUNTER — Other Ambulatory Visit: Payer: Self-pay | Admitting: Cardiology

## 2014-06-29 ENCOUNTER — Telehealth (HOSPITAL_COMMUNITY): Payer: Self-pay | Admitting: *Deleted

## 2014-06-29 NOTE — Telephone Encounter (Signed)
Completed PA for pt's Adcirca through [employer], med was approved through 06/29/15, called Kristal with [facility] and left a VM that this was approved

## 2014-07-02 ENCOUNTER — Encounter (HOSPITAL_COMMUNITY): Payer: Medicare Other

## 2014-07-04 ENCOUNTER — Other Ambulatory Visit (HOSPITAL_COMMUNITY): Payer: Self-pay | Admitting: *Deleted

## 2014-07-04 MED ORDER — BISOPROLOL FUMARATE 5 MG PO TABS: 2.5000 mg | ORAL_TABLET | Freq: Every day | ORAL | Status: DC

## 2014-07-09 ENCOUNTER — Telehealth (HOSPITAL_COMMUNITY): Payer: Self-pay | Admitting: Cardiology

## 2014-07-09 NOTE — Telephone Encounter (Signed)
Pt called to inform Dr.McLean Adcirca co pay is $1000, pt states he is not willing to pay this monthly so pt will not be starting med

## 2014-07-09 NOTE — Telephone Encounter (Signed)
Spoke w/pt provided number for [employer] [phone]) to assist with copay, he will give them a call

## 2014-07-17 ENCOUNTER — Ambulatory Visit (HOSPITAL_COMMUNITY): Payer: Medicare Other | Attending: Cardiology

## 2014-07-17 DIAGNOSIS — I272 Other secondary pulmonary hypertension: Secondary | ICD-10-CM | POA: Diagnosis not present

## 2014-07-20 ENCOUNTER — Ambulatory Visit (HOSPITAL_BASED_OUTPATIENT_CLINIC_OR_DEPARTMENT_OTHER): Payer: Medicare Other | Attending: Cardiology

## 2014-07-20 VITALS — Ht 70.0 in | Wt 156.0 lb

## 2014-07-20 DIAGNOSIS — G4733 Obstructive sleep apnea (adult) (pediatric): Secondary | ICD-10-CM | POA: Diagnosis not present

## 2014-07-20 DIAGNOSIS — R0683 Snoring: Secondary | ICD-10-CM | POA: Diagnosis not present

## 2014-07-20 DIAGNOSIS — G4719 Other hypersomnia: Secondary | ICD-10-CM | POA: Diagnosis present

## 2014-07-20 DIAGNOSIS — G4761 Periodic limb movement disorder: Secondary | ICD-10-CM | POA: Diagnosis not present

## 2014-07-20 DIAGNOSIS — I272 Pulmonary hypertension, unspecified: Secondary | ICD-10-CM

## 2014-07-20 DIAGNOSIS — I519 Heart disease, unspecified: Secondary | ICD-10-CM

## 2014-07-20 DIAGNOSIS — I493 Ventricular premature depolarization: Secondary | ICD-10-CM | POA: Insufficient documentation

## 2014-07-24 ENCOUNTER — Telehealth: Payer: Self-pay | Admitting: Cardiology

## 2014-07-24 ENCOUNTER — Telehealth (HOSPITAL_COMMUNITY): Payer: Self-pay | Admitting: Vascular Surgery

## 2014-07-24 NOTE — Telephone Encounter (Signed)
Please let patient know that they have sleep apnea and recommend CPAP titration. Please set up titration in the sleep lab.

## 2014-07-24 NOTE — Sleep Study (Signed)
   NAME: Caleb [name] DATE OF BIRTH:  [birth year] MEDICAL RECORD NUMBER [id number]  LOCATION: [facility]  PHYSICIAN: Giulian Goldring R  DATE OF STUDY: 07/20/2014  SLEEP STUDY TYPE: Nocturnal Polysomnogram               REFERRING PHYSICIAN: Larey Dresser, MD  INDICATION FOR STUDY: Snoring, excessive daytime sleepiness  EPWORTH SLEEPINESS SCORE: 10 HEIGHT: _0  (177.8 cm)  WEIGHT: 156 lb (70.761 kg)    Body mass index is 22.38 kg/(m^2).  NECK SIZE: 13 in.  MEDICATIONS: Reviewed in the chart  SLEEP ARCHITECTURE: The patient slept for a total of 123 minutes out of a total sleep time of 358 minutes.  There was no slow wave or REM sleep.  The onset to sleep latency was 7 minutes.  The sleep efficiency was reduced at 33%.    RESPIRATORY DATA: There were 25 apneas, of which, 16 were obstructive, 8 were central and 1 was a mixed event.  There were 33 hypopneas.  The total AHI was 18.5 events per hour, consistent with moderate obstructive sleep apnea/hyoponea syndrome.  Most events occurred in the nonsupine position.  There was moderate snoring.  OXYGEN DATA: The average oxygen saturation was 93%.  The lowset oxygen saturation was 86%.  The time spent with oxygen saturations < 88% was 1.1 minutes.    CARDIAC DATA: The patient maintained sinus bradycardia with PVC's during the study.  The average heart rate was 52 bpm.  The lowest heart rate was 35 bpm and the fastest heart rate was 113 bpm.    MOVEMENT/PARASOMNIA: There were an increased number of periodic limb movement disorders with a PLMS index of 43.9 movements per hour. There were no REM sleep behavioral disorders.  IMPRESSION/ RECOMMENDATION:   1.  Moderate obstructive sleep apnea/hypopnea syndrome with an AHI of 18.5 events per hour.   Most events occurred in the nonsupine position. 2.  There was moderate snoring. 3.  Reduced sleep efficiency with reduced sleep time. 4.  Abnormal sleep architecture with no slow  wave or REM sleep. 5.  Oxygen desaturations as low as 86% with respiratory events. 6.  There were PVC's noted during the study.   7.  Increased periodic limb movements during the study with a PLMS index of 43.9 per hour. 8.  Given the degree of sleep disordered breathing in a short period of sleep time, oxygen desaturations and elevated epworth sleepiness scale with excessive daytime sleepiness and moderate snoring, recommend a trial of CPAP therapy.  Signed: Sueanne Margarita Diplomate, American Board of Sleep Medicine  ELECTRONICALLY SIGNED ON:  07/24/2014, 2:47 PM [place] PH: [phone]   FX: [phone] [place]

## 2014-07-24 NOTE — Telephone Encounter (Signed)
Pt called he would like to speak to a nurse... Its been a ongoing discussion about a particular medication whether the pt is going on it or not. Pt found a similar medication that is far less expensive.. Please advise

## 2014-07-25 ENCOUNTER — Telehealth (HOSPITAL_COMMUNITY): Payer: Self-pay | Admitting: Vascular Surgery

## 2014-07-25 DIAGNOSIS — I5022 Chronic systolic (congestive) heart failure: Secondary | ICD-10-CM

## 2014-07-25 MED ORDER — POTASSIUM CHLORIDE CRYS ER 20 MEQ PO TBCR: 20.0000 meq | EXTENDED_RELEASE_TABLET | Freq: Every day | ORAL | Status: DC

## 2014-07-25 NOTE — Telephone Encounter (Signed)
Pt called to [facility] spoke to [place].. Pt needs his potassium sent to [place].. Please advise

## 2014-07-25 NOTE — Telephone Encounter (Signed)
rx sent

## 2014-07-25 NOTE — Telephone Encounter (Signed)
Spoke with [name] and he stated that he would like to wait until he sees [name] again this month. He said that he would let us know what he wants to do.  If I have not heard from him by the time he sees [name], I will call back to discuss.

## 2014-07-26 ENCOUNTER — Other Ambulatory Visit (HOSPITAL_COMMUNITY): Payer: Self-pay | Admitting: *Deleted

## 2014-07-26 MED ORDER — TADALAFIL (PAH) 20 MG PO TABS: 20.0000 mg | ORAL_TABLET | Freq: Every day | ORAL | Status: DC

## 2014-08-01 ENCOUNTER — Telehealth: Payer: Self-pay | Admitting: *Deleted

## 2014-08-01 NOTE — Telephone Encounter (Signed)
Pt states he took first dose of Adcirca 20 mg on Tue evening about 5 pm and today his HR has been low in the 45-50 range, he c/o slight dizziness at times.  He reports BP has been slightly elevated for him in the 130 range.  He feels this is due to the Adcirca and would like Dr Claris Gladden recommendation, will send to him to review tomorrow, pt aware and agreeable

## 2014-08-01 NOTE — Telephone Encounter (Signed)
Adcirca won't make his heart rate go down.  Would

## 2014-08-01 NOTE — Telephone Encounter (Signed)
Adcirca should not make his HR go down unless perhaps his cardiac output is improving.  Would stick with it as long as he does not have marked symptoms, needs to give it some time.

## 2014-08-02 NOTE — Telephone Encounter (Signed)
Left message to call back

## 2014-08-03 NOTE — Telephone Encounter (Signed)
Addressed by other clinical staff.

## 2014-08-03 NOTE — Telephone Encounter (Signed)
Spoke w/pt, he is aware and agreeable will continue med and will keep f/u appt as sch on Mon 5/16

## 2014-08-06 ENCOUNTER — Ambulatory Visit (HOSPITAL_COMMUNITY)
Admission: RE | Admit: 2014-08-06 | Discharge: 2014-08-06 | Disposition: A | Discharge status: Home / Self Care | Payer: Medicare Other | Source: Ambulatory Visit | Attending: Cardiology | Admitting: Cardiology

## 2014-08-06 VITALS — BP 130/46 | HR 60 | Wt 158.8 lb

## 2014-08-06 DIAGNOSIS — G4733 Obstructive sleep apnea (adult) (pediatric): Secondary | ICD-10-CM | POA: Diagnosis not present

## 2014-08-06 DIAGNOSIS — I5022 Chronic systolic (congestive) heart failure: Secondary | ICD-10-CM | POA: Diagnosis not present

## 2014-08-06 DIAGNOSIS — Z9889 Other specified postprocedural states: Secondary | ICD-10-CM

## 2014-08-06 DIAGNOSIS — I480 Paroxysmal atrial fibrillation: Secondary | ICD-10-CM | POA: Insufficient documentation

## 2014-08-06 DIAGNOSIS — I270 Primary pulmonary hypertension: Secondary | ICD-10-CM | POA: Diagnosis not present

## 2014-08-06 DIAGNOSIS — Z951 Presence of aortocoronary bypass graft: Secondary | ICD-10-CM | POA: Diagnosis not present

## 2014-08-06 DIAGNOSIS — I272 Pulmonary hypertension, unspecified: Secondary | ICD-10-CM

## 2014-08-06 LAB — BRAIN NATRIURETIC PEPTIDE: B Natriuretic Peptide: 537.3 pg/mL — ABNORMAL HIGH (ref 0.0–100.0)

## 2014-08-06 LAB — BASIC METABOLIC PANEL
ANION GAP: 10 (ref 5–15)
BUN: 15 mg/dL (ref 6–20)
CHLORIDE: 99 mmol/L — AB (ref 101–111)
CO2: 28 mmol/L (ref 22–32)
Calcium: 9.6 mg/dL (ref 8.9–10.3)
Creatinine, Ser: 1 mg/dL (ref 0.61–1.24)
Glucose, Bld: 97 mg/dL (ref 65–99)
Potassium: 4.1 mmol/L (ref 3.5–5.1)
SODIUM: 137 mmol/L (ref 135–145)

## 2014-08-06 LAB — DIGOXIN LEVEL: Digoxin Level: 0.4 ng/mL — ABNORMAL LOW (ref 0.8–2.0)

## 2014-08-06 MED ORDER — EPLERENONE 25 MG PO TABS: 25.0000 mg | ORAL_TABLET | Freq: Every day | ORAL | Status: DC

## 2014-08-06 NOTE — Patient Instructions (Signed)
Start Inspra 25 mg daily  Labs today  Labs in 2 weeks  Will contact Dr Theodosia Blender office to titrate your CPAP machine  Your physician recommends that you schedule a follow-up appointment in: 6 weeks

## 2014-08-06 NOTE — Progress Notes (Signed)
6 min walk test completed, pt ambulated 1350f (414.559mon O2 sats ranged from 96-98% and HR ranged from 54-79

## 2014-08-06 NOTE — Progress Notes (Signed)
Patient ID: Caleb [name], male   DOB: 1940/09/06, 74 y.o.   MRN: [id number] PCP: Dr. Sherren Mocha EP: [name]  74 y.o with complex past history presents for heart failure evaluation.  Patient had anterior MI in 2004 and developed ischemic cardiomyopathy as well as mitral regurgitation. He also developed atrial fibrillation.  In 10/14, he had MV repair, Maze, CABG with LIMA-LAD, and LA appendage closure at [facility] in [place].  Subsequently, atrial fibrillation returned and he had an atrial fibrillation ablation in [facility] in 3/15.  He wore a Zio patch in 12/15 and had a low atrial fibrillation burden of 7%.  He has had a long-standing ischemic cardiomyopathy.  In 2015, EF was 20-25%.  Echo in 2016 also showed EF 25-30% but estimated PA pressure suggested severe pulmonary hypertension, which is new for him.    At initial appointment, he reported increased exertional dyspnea over a number of weeks.  RHC was done, showing mildly elevated PCWP with moderate pulmonary HTN and low cardiac index (1.93 thermo, 2.13 Fick).  V/Q scan showed no PE.  I started him on digoxin and have titrated his Lasix to 80 mg daily.  He is now on bisoprolol and able to tolerate it.  At a prior appointment, I tried to increase his valsartan to 40 bid, but he felt fatigued and lightheaded so has decreased it to 40 qam, 20 qpm.  He can tolerate this.  CPX in 4/16 showed mildly decreased functional capacity.   He is doing reasonably well.  He started Adcirca about a week ago.  He feels like he is breathing better since then.  6 minute walk is improved today.  He is not short of breath walking on flat ground, mild dyspnea with hills.  No chest pain.  No orthopnea, PND, or bendopnea.  He is sleeping a bit better but still not restful. Sleep study showed moderate OSA.  No palpitations.    6 minute walk (3/16): 381 m  6 minute walk (5/16): 414.5 m  Labs (2/15): LDL 144 Labs (8/15): K 4.6, creatinine 0.9 Labs (12/15): HCT 42.3    Labs (2/16): K 4 => 4.2, creatinine 1.05 => 0.92, BNP 268 Labs (3/16): BNP 495 => 320, digoxin 0.4, RF 14.7 (very mild increase), TSH normal, HIV negative, anti-SCL70 negative, creatinine 0.91, K 4.4  PMH: 1. CAD: Anterior MI in 2004.  Cardiac surgery in 10/14 included LIMA-LAD.  2. Chronic mitral regurgitation: 10/14 surgery at [facility] with MV repair, Maze, LIMA-LAD, and LA appendage closure.  3. Atrial fibrillation: Paroxysmal.  H/o Maze in 2014.  Had recurrent atrial fibrillation with ablation in 3/15 by Dr Ola Spurr in [facility].  Not anticoagulated after 2 severe prostate bleeding episodes.  - Zio patch in 12/15 with low atrial fibrillation burden (7%).  4. Chronotropic incompetence.  5. HTN 6. Hyperlipidemia: Refuses statin.  7. Peripheral neuropathy 8. GERD 9. H/o BPPV 10. H/o TIA 11. Ischemic cardiomyopathy: cardiac MRI 7/14 with EF 35%, moderate MR, normal RV size and systolic function, extensive anterior and anteroseptal LGE suggestive of non-viable myocardium (this was prior to LIMA-LAD).  Echo 1/15 with EF 20-25%, moderate AI, PA systolic pressure 39 mmHg. Echo (1/16) with EF 20-25%, diffuse hypokinesis with regionality, moderate LV dilation, moderate AI, s/p MV repair with mild MR and normal gradients, RV dilated with mildly decreased systolic function, PA systolic pressure 71 mmHg.  RHC (2/16) with mean RA 6, PA 61/25 mean 40, mean PCWP 23, CI 2.13/PVR 4.3 (Fick), CI  1.93/PVR 4.7 (thermo).  CPX (4/16) with peak VO2 17.9, VE/VCO2 34.7 => mildly decreased functional capacity.  - Spironolactone apparently caused cognitive deficits - Intolerant of Coreg due to development of severe alopecia 12. Carotid dopplers (1/16) with 40-59% bilateral ICA stenosis.  13. Aortic insufficiency: Moderate by last echo 2/16.  14. Pulmonary HTN: Mixed PAH and pulmonary venous hypertension.  PFTs (9/14) were normal.  V/Q scan (2/16) with no evidence of acute or chronic PE.  6 minute walk (3/16)  381 m. 6 minute walk (5/16) 414.5 m.  15. OSA: Moderate on 5/16 sleep study.  16. Venous insufficiency  SH: Married, lives in [place], Careers information officer, nonsmoker  FH: CAD  ROS: All systems reviewed and negative except as per HPI.   Current Outpatient Prescriptions  Medication Sig Dispense Refill  . Ascorbic Acid (VITAMIN C) 500 MG CAPS Take 2 capsules by mouth daily.    Marland Kitchen aspirin 81 MG chewable tablet Chew 81 mg by mouth daily.    . bisoprolol (ZEBETA) 5 MG tablet Take 0.5 tablets (2.5 mg total) by mouth daily. 15 tablet 3  . digoxin (LANOXIN) 0.125 MG tablet Take 1 tablet (0.125 mg total) by mouth daily. 90 tablet 3  . furosemide (LASIX) 80 MG tablet Take 80 mg by mouth daily.    . Magnesium 100 MG CAPS Take 1 capsule by mouth 2 (two) times daily.     . nitroGLYCERIN (NITROSTAT) 0.4 MG SL tablet Place 0.4 mg under the tongue every 5 (five) minutes as needed. May repeat for up to 3 doses.     . Omega-3 Fatty Acids (THE VERY FINEST FISH OIL) LIQD Take 2-3 g by mouth daily.     . potassium chloride SA (K-DUR,KLOR-CON) 20 MEQ tablet Take 1 tablet (20 mEq total) by mouth daily. 30 tablet 3  . Tadalafil, PAH, (ADCIRCA) 20 MG TABS Take 1 tablet (20 mg total) by mouth daily. 30 tablet 6  . valsartan (DIOVAN) 40 MG tablet 40 mg in the AM and 20 mg in the PM    . Zinc 100 MG TABS Take 1 tablet by mouth daily.     Marland Kitchen eplerenone (INSPRA) 25 MG tablet Take 1 tablet (25 mg total) by mouth daily. 30 tablet 6   No current facility-administered medications for this encounter.   BP 130/46 mmHg  Pulse 60  Wt 158 lb 12.8 oz (72.031 kg)  SpO2 98% General: NAD Neck: No JVD, no thyromegaly or thyroid nodule.  Lungs: Clear to auscultation bilaterally with normal respiratory effort. CV: Nondisplaced PMI.  Heart regular S1/S2, no S3/S4, 1/6 HSM apex, 2/6 diastolic murmur along the sternal border.  1+ edema left ankle, trace edema on right.  No carotid bruit.  Normal pedal pulses.  Abdomen: Soft,  nontender, no hepatosplenomegaly, no distention.  Skin: Intact without lesions or rashes.  Neurologic: Alert and oriented x 3.  Psych: Normal affect. Extremities: No clubbing or cyanosis.  HEENT: Normal.   Assessment/Plan: 1. CAD: This seems stable, no chest pain. S/p LIMA-LAD.  He is on ASA 81.  He has decided not to take statins after reviewing the data.   2. S/p mitral valve repair: The MV repair looked stable on recent echo with mild MR and no evidence for significant mitral stenosis.  3. Aortic insufficiency: Moderate on recent echo, similar to prior.  Diastolic murmur on exam.  4. Chronic systolic CHF: Ischemic cardiomyopathy, EF 20-25%.  This is similar to the past.  However, estimated PA pressure was higher  on 1/16 echo than in past and symptoms worsened.  RHC showed moderate pulmonary hypertension.  Now doing better with medication adjustment and NYHA class II. Mildly decreased functional capacity on 4/16 CPX.  On exam today, he is not significantly volume overloaded. - Continue digoxin, check level today.  - He has tolerated bisoprolol, continue 2.5 mg daily.  - Continue valsartan 40 qam/20 qpm (did not tolerate 40 bid).   - He had ?cognitive problems with spironolactone.  I do think that we can try eplerenone => start 25 mg daily with BMET today and repeat BMET in 2 wks.  - Continue Lasix 80 mg daily, BNP today.   - He does not want an ICD and understands the reasoning for having one.  - In the future, he could be an LVAD candidate.  - He is interested in stem cell therapy and has been doing research on possible clinical trials.  5. Atrial fibrillation: s/p Maze in 10/14, then ablation in 3/15. 7% atrial fibrillation on Zio patch in 12/15.  At a prior appointment, he felt like he was having frequent runs of atrial fibrillation.  This seems to have improved with less palpitations now.   - In the future, he could be a Tikosyn candidate if he has more atrial fibrillation.  - Currently,  he is not anticoagulated due to history of prostate bleeding.  6. Pulmonary hypertension: Severe by echo (was mild on prior echo in 1/15), moderate by recent RHC.  Mixed pulmonary venous and pulmonary arterial HTN.  It is possible that the [facility] component is due to pulmonary vascular remodeling in the setting of chronic mitral regurgitation prior to MV repair. Negative V/Q scan, no evidence for CTEPH.  PFTs normal in 9/14.  He is seeing Dr [place] Bells.  Recent sleep study showed moderate OSA.  He has started on Adcirca and is feeling like it has helped.  6 minute walk is better.  - Continue Adcirca at 20 mg daily, will work on trying to get him better pricing for the medication.  If we cannot get the price down, may need to switch to Revatio.  - He will need CPAP titration.   Followup with me in 6 wks  Loralie Champagne 08/06/2014

## 2014-08-09 ENCOUNTER — Telehealth (HOSPITAL_COMMUNITY): Payer: Self-pay | Admitting: *Deleted

## 2014-08-09 DIAGNOSIS — I5022 Chronic systolic (congestive) heart failure: Secondary | ICD-10-CM

## 2014-08-09 MED ORDER — POTASSIUM CHLORIDE CRYS ER 20 MEQ PO TBCR: 10.0000 meq | EXTENDED_RELEASE_TABLET | Freq: Every day | ORAL | Status: DC

## 2014-08-09 NOTE — Telephone Encounter (Signed)
Pt aware and agreeable, he is interested in knowing if he would be able to stop his digoxin, will send to Dr Aundra Dubin for review

## 2014-08-09 NOTE — Telephone Encounter (Signed)
Left message to call back

## 2014-08-09 NOTE — Telephone Encounter (Signed)
Pt called with questions about his potassium.  He was started on Inspra 25 mg daily at appt on 5/17 and he is also on KCL 20 meq daily, he is wondering if the KCL should be stopped or not.  Pt had labs at [facility] with a K of 4.1 and is sch for repeat bmet on 6/1, will send to Dr Aundra Dubin to review

## 2014-08-09 NOTE — Telephone Encounter (Signed)
Leave digoxin alone for now, may see clinical worsening if he stops it.

## 2014-08-09 NOTE — Telephone Encounter (Signed)
Cut the KCl to 10 mEq daily and get BMET end of next week.

## 2014-08-10 NOTE — Telephone Encounter (Signed)
Pt aware and agreeable.  

## 2014-08-21 ENCOUNTER — Telehealth (HOSPITAL_COMMUNITY): Payer: Self-pay | Admitting: Cardiology

## 2014-08-21 NOTE — Telephone Encounter (Signed)
Pt aware of sleep study results

## 2014-08-22 ENCOUNTER — Ambulatory Visit (HOSPITAL_COMMUNITY)
Admission: RE | Admit: 2014-08-22 | Discharge: 2014-08-22 | Disposition: A | Discharge status: Home / Self Care | Payer: Medicare Other | Source: Ambulatory Visit | Attending: Cardiology | Admitting: Cardiology

## 2014-08-22 DIAGNOSIS — I5022 Chronic systolic (congestive) heart failure: Secondary | ICD-10-CM

## 2014-08-22 LAB — BASIC METABOLIC PANEL
Anion gap: 10 (ref 5–15)
BUN: 16 mg/dL (ref 6–20)
CHLORIDE: 97 mmol/L — AB (ref 101–111)
CO2: 30 mmol/L (ref 22–32)
Calcium: 9.6 mg/dL (ref 8.9–10.3)
Creatinine, Ser: 1.04 mg/dL (ref 0.61–1.24)
GFR calc Af Amer: >60 mL/min (ref >60)
Glucose, Bld: 89 mg/dL (ref 65–99)
Potassium: 4.6 mmol/L (ref 3.5–5.1)
SODIUM: 137 mmol/L (ref 135–145)

## 2014-08-27 ENCOUNTER — Telehealth (HOSPITAL_COMMUNITY): Payer: Self-pay | Admitting: *Deleted

## 2014-08-27 ENCOUNTER — Telehealth (HOSPITAL_COMMUNITY): Payer: Self-pay | Admitting: Vascular Surgery

## 2014-08-27 ENCOUNTER — Encounter (HOSPITAL_COMMUNITY): Payer: Self-pay | Admitting: Emergency Medicine

## 2014-08-27 ENCOUNTER — Emergency Department (HOSPITAL_COMMUNITY): Payer: Medicare Other

## 2014-08-27 ENCOUNTER — Emergency Department (HOSPITAL_COMMUNITY)
Admission: EM | Admit: 2014-08-27 | Discharge: 2014-08-27 | Disposition: A | Discharge status: Home / Self Care | Payer: Medicare Other | Attending: Emergency Medicine | Admitting: Emergency Medicine

## 2014-08-27 DIAGNOSIS — E785 Hyperlipidemia, unspecified: Secondary | ICD-10-CM | POA: Diagnosis not present

## 2014-08-27 DIAGNOSIS — I5023 Acute on chronic systolic (congestive) heart failure: Secondary | ICD-10-CM | POA: Insufficient documentation

## 2014-08-27 DIAGNOSIS — R079 Chest pain, unspecified: Secondary | ICD-10-CM | POA: Diagnosis not present

## 2014-08-27 DIAGNOSIS — Z8669 Personal history of other diseases of the nervous system and sense organs: Secondary | ICD-10-CM | POA: Insufficient documentation

## 2014-08-27 DIAGNOSIS — Z79899 Other long term (current) drug therapy: Secondary | ICD-10-CM | POA: Diagnosis not present

## 2014-08-27 DIAGNOSIS — Z8673 Personal history of transient ischemic attack (TIA), and cerebral infarction without residual deficits: Secondary | ICD-10-CM | POA: Diagnosis not present

## 2014-08-27 DIAGNOSIS — I4891 Unspecified atrial fibrillation: Secondary | ICD-10-CM | POA: Diagnosis not present

## 2014-08-27 DIAGNOSIS — I2510 Atherosclerotic heart disease of native coronary artery without angina pectoris: Secondary | ICD-10-CM | POA: Diagnosis not present

## 2014-08-27 DIAGNOSIS — M79602 Pain in left arm: Secondary | ICD-10-CM | POA: Diagnosis not present

## 2014-08-27 DIAGNOSIS — Z9889 Other specified postprocedural states: Secondary | ICD-10-CM | POA: Diagnosis not present

## 2014-08-27 DIAGNOSIS — Z7901 Long term (current) use of anticoagulants: Secondary | ICD-10-CM | POA: Diagnosis not present

## 2014-08-27 DIAGNOSIS — Z8709 Personal history of other diseases of the respiratory system: Secondary | ICD-10-CM | POA: Diagnosis not present

## 2014-08-27 DIAGNOSIS — I10 Essential (primary) hypertension: Secondary | ICD-10-CM | POA: Diagnosis not present

## 2014-08-27 DIAGNOSIS — Z8701 Personal history of pneumonia (recurrent): Secondary | ICD-10-CM | POA: Diagnosis not present

## 2014-08-27 DIAGNOSIS — Z951 Presence of aortocoronary bypass graft: Secondary | ICD-10-CM | POA: Insufficient documentation

## 2014-08-27 DIAGNOSIS — K219 Gastro-esophageal reflux disease without esophagitis: Secondary | ICD-10-CM | POA: Insufficient documentation

## 2014-08-27 DIAGNOSIS — Z7982 Long term (current) use of aspirin: Secondary | ICD-10-CM | POA: Diagnosis not present

## 2014-08-27 DIAGNOSIS — Z9861 Coronary angioplasty status: Secondary | ICD-10-CM | POA: Diagnosis not present

## 2014-08-27 LAB — CBC WITH DIFFERENTIAL/PLATELET
Basophils Absolute: 0 10*3/uL (ref 0.0–0.1)
Basophils Relative: 1 % (ref 0–1)
EOS PCT: 3 % (ref 0–5)
Eosinophils Absolute: 0.2 10*3/uL (ref 0.0–0.7)
HEMATOCRIT: 42.5 % (ref 39.0–52.0)
Hemoglobin: 14.4 g/dL (ref 13.0–17.0)
LYMPHS ABS: 1.2 10*3/uL (ref 0.7–4.0)
LYMPHS PCT: 19 % (ref 12–46)
MCH: 30.1 pg (ref 26.0–34.0)
MCHC: 33.9 g/dL (ref 30.0–36.0)
MCV: 88.9 fL (ref 78.0–100.0)
MONOS PCT: 8 % (ref 3–12)
Monocytes Absolute: 0.5 10*3/uL (ref 0.1–1.0)
NEUTROS ABS: 4.4 10*3/uL (ref 1.7–7.7)
Neutrophils Relative %: 69 % (ref 43–77)
Platelets: 177 10*3/uL (ref 150–400)
RBC: 4.78 MIL/uL (ref 4.22–5.81)
RDW: 14.7 % (ref 11.5–15.5)
WBC: 6.3 10*3/uL (ref 4.0–10.5)

## 2014-08-27 LAB — I-STAT CHEM 8, ED
BUN: 23 mg/dL — ABNORMAL HIGH (ref 6–20)
CREATININE: 1.1 mg/dL (ref 0.61–1.24)
Calcium, Ion: 1.15 mmol/L (ref 1.13–1.30)
Chloride: 95 mmol/L — ABNORMAL LOW (ref 101–111)
Glucose, Bld: 111 mg/dL — ABNORMAL HIGH (ref 65–99)
HEMATOCRIT: 47 % (ref 39.0–52.0)
Hemoglobin: 16 g/dL (ref 13.0–17.0)
Potassium: 3.9 mmol/L (ref 3.5–5.1)
SODIUM: 135 mmol/L (ref 135–145)
TCO2: 26 mmol/L (ref 0–100)

## 2014-08-27 LAB — BRAIN NATRIURETIC PEPTIDE: B Natriuretic Peptide: 417.7 pg/mL — ABNORMAL HIGH (ref 0.0–100.0)

## 2014-08-27 LAB — I-STAT TROPONIN, ED
TROPONIN I, POC: 0.03 ng/mL (ref 0.00–0.08)
TROPONIN I, POC: 0.05 ng/mL (ref 0.00–0.08)

## 2014-08-27 MED ORDER — ASPIRIN 81 MG PO CHEW
243.0000 mg | CHEWABLE_TABLET | Freq: Once | ORAL | Status: AC
  Administered 2014-08-27: 243 mg via ORAL
  Filled 2014-08-27: qty 3

## 2014-08-27 NOTE — ED Provider Notes (Signed)
CSN: [id number]     Arrival date & time 08/27/14  1408 History   First MD Initiated Contact with Patient 08/27/14 1525     Chief Complaint  Patient presents with  . Arm Pain     (Consider location/radiation/quality/duration/timing/severity/associated sxs/prior Treatment) Patient is a 74 y.o. male presenting with extremity pain.  Extremity Pain This is a new problem. The current episode started today. The problem occurs rarely. The problem has been resolved. Pertinent negatives include no abdominal pain, anorexia, arthralgias, chest pain, chills, congestion, coughing, diaphoresis, fatigue, fever, headaches, nausea, neck pain, numbness, rash, sore throat, vertigo, visual change, vomiting or weakness. He has tried nothing for the symptoms.   Left arm pain lasted 10 min Past Medical History  Diagnosis Date  . CAD (coronary artery disease)   . Atrial fibrillation   . Cardiomyopathy, ischemic   . Heart failure, systolic, acute on chronic   . Hypertension   . Hyperlipidemia   . Pleural effusion   . Positional vertigo   . Dizziness   . GERD (gastroesophageal reflux disease)   . Atypical pneumonia   . Cough   . Allergic rhinitis   . TIA (transient ischemic attack)   . OSA (obstructive sleep apnea)     Home sleep test 07/05/2009 AHI 8.2  . Chronic anticoagulation    Past Surgical History  Procedure Laterality Date  . Coronary stent placement  2004    LAD  . Shoulder surgery    . Tee without cardioversion N/A 09/21/2012    Procedure: TRANSESOPHAGEAL ECHOCARDIOGRAM (TEE);  Surgeon: Larey Dresser, MD;  Location: [facility] ENDOSCOPY;  Service: Cardiovascular;  Laterality: N/A;  . Coronary artery bypass graft  01/09/13    LAD LIMA, left atrial appendage  . Mitral valve annuloplasty  01/09/13  . Aortic valve repair  01/09/13  . Right heart catheterization N/A 05/11/2014    Procedure: RIGHT HEART CATH;  Surgeon: Larey Dresser, MD;  Location: [facility] CATH LAB;  Service: Cardiovascular;  Laterality:  N/A;   Family History  Problem Relation Age of Onset  . Adopted: Yes  . Diabetes      [place]  . Heart disease Neg Hx   . Hypertension Neg Hx    History  Substance Use Topics  . Smoking status: Never Smoker   . Smokeless tobacco: Never Used  . Alcohol Use: No    Review of Systems  Constitutional: Negative for fever, chills, diaphoresis, appetite change and fatigue.  HENT: Negative for congestion, ear pain, facial swelling, mouth sores and sore throat.   Eyes: Negative for visual disturbance.  Respiratory: Negative for cough, chest tightness and shortness of breath.   Cardiovascular: Negative for chest pain and palpitations.  Gastrointestinal: Negative for nausea, vomiting, abdominal pain, diarrhea, blood in stool and anorexia.  Endocrine: Negative for cold intolerance and heat intolerance.  Genitourinary: Negative for frequency, decreased urine volume and difficulty urinating.  Musculoskeletal: Negative for back pain, arthralgias, neck pain and neck stiffness.  Skin: Negative for rash.  Neurological: Negative for dizziness, vertigo, weakness, light-headedness, numbness and headaches.  All other systems reviewed and are negative.     Allergies  Carvedilol; Pradaxa; Sulfonamide derivatives; and Xarelto  Home Medications   Prior to Admission medications   Medication Sig Start Date End Date Taking? Authorizing Provider  Ascorbic Acid (VITAMIN C) 500 MG CAPS Take 2 capsules by mouth daily.   Yes Historical Provider, MD  aspirin 81 MG chewable tablet Chew 81 mg by mouth daily.   Yes  Historical Provider, MD  bisoprolol (ZEBETA) 5 MG tablet Take 0.5 tablets (2.5 mg total) by mouth daily. 07/04/14  Yes Jolaine Artist, MD  digoxin (LANOXIN) 0.125 MG tablet Take 1 tablet (0.125 mg total) by mouth daily. 05/24/14  Yes Larey Dresser, MD  eplerenone (INSPRA) 25 MG tablet Take 1 tablet (25 mg total) by mouth daily. 08/06/14  Yes Larey Dresser, MD  furosemide (LASIX) 80 MG tablet Take  80 mg by mouth daily.   Yes Historical Provider, MD  Magnesium 100 MG CAPS Take 1 capsule by mouth 2 (two) times daily.    Yes Historical Provider, MD  nitroGLYCERIN (NITROSTAT) 0.4 MG SL tablet Place 0.4 mg under the tongue every 5 (five) minutes as needed. May repeat for up to 3 doses.    Yes Historical Provider, MD  Omega-3 Fatty Acids (THE VERY FINEST FISH OIL) LIQD Take 2-3 g by mouth daily.    Yes Historical Provider, MD  potassium chloride SA (K-DUR,KLOR-CON) 20 MEQ tablet Take 0.5 tablets (10 mEq total) by mouth daily. 08/09/14  Yes Larey Dresser, MD  Tadalafil, PAH, (ADCIRCA) 20 MG TABS Take 1 tablet (20 mg total) by mouth daily. 07/26/14  Yes Jolaine Artist, MD  valsartan (DIOVAN) 40 MG tablet 40 mg in the AM and 20 mg in the PM   Yes Historical Provider, MD  Zinc 100 MG TABS Take 1 tablet by mouth daily.    Yes Historical Provider, MD   BP 141/52 mmHg  Pulse 86  Temp(Src) 97.4 F (36.3 C) (Oral)  Resp 14  Wt 154 lb 11.2 oz (70.171 kg)  SpO2 94% Physical Exam  Constitutional: He is oriented to person, place, and time. He appears well-nourished. No distress.  HENT:  Head: Normocephalic and atraumatic.  Right Ear: External ear normal.  Left Ear: External ear normal.  Eyes: Pupils are equal, round, and reactive to light. Right eye exhibits no discharge. Left eye exhibits no discharge. No scleral icterus.  Neck: Normal range of motion. Neck supple.  Cardiovascular: Normal rate.  Exam reveals no gallop and no friction rub.   No murmur heard. Pulmonary/Chest: Effort normal and breath sounds normal. No stridor. No respiratory distress. He has no wheezes. He has no rales. He exhibits no tenderness.  Abdominal: Soft. He exhibits no distension and no mass. There is no tenderness. There is no rebound and no guarding.  Musculoskeletal: He exhibits no edema or tenderness.  Neurological: He is alert and oriented to person, place, and time.  Skin: Skin is warm and dry. No rash noted. He  is not diaphoretic. No erythema.    ED Course  Procedures (including critical care time) Labs Review Labs Reviewed  BRAIN NATRIURETIC PEPTIDE - Abnormal; Notable for the following:    B Natriuretic Peptide 417.7 (*)    All other components within normal limits  I-STAT CHEM 8, ED - Abnormal; Notable for the following:    Chloride 95 (*)    BUN 23 (*)    Glucose, Bld 111 (*)    All other components within normal limits  CBC WITH DIFFERENTIAL/PLATELET  Randolm Idol, ED  Randolm Idol, ED    Imaging Review Dg Chest Port 1 View  08/27/2014   CLINICAL DATA:  Chest pain and left arm pain  EXAM: PORTABLE CHEST - 1 VIEW  COMPARISON:  05/21/2014  FINDINGS: Normal heart size and aortic contours. The patient is status post mitral valve replacement and left atrial exclusion. There is no edema,  consolidation, effusion, or pneumothorax. No acute osseous findings.  IMPRESSION: No active disease.   Electronically Signed   By: Monte Fantasia M.D.   On: 08/27/2014 15:58     EKG Interpretation   Date/Time:  Monday August 27 2014 15:17:12 EDT Ventricular Rate:  60 PR Interval:  224 QRS Duration: 128 QT Interval:  448 QTC Calculation: 448 R Axis:   46 Text Interpretation:  Sinus rhythm with 1st degree A-V block Non-specific  intra-ventricular conduction block Cannot rule out Septal infarct , age  undetermined T wave abnormality, consider lateral ischemia Abnormal ECG ST  depression in inferior and lateral leads may have ST elevation in aVL  Confirmed by Alvino Chapel  MD, Ovid Curd [phone]) on 08/27/2014 3:19:37 PM Also  confirmed by BELFI  MD, MELANIE ([id number])  on 08/27/2014 3:28:18 PM      MDM   Codeine A. fib CAD status post stenting in 2004 and CABG in 2014 with subsequent heart failure with an EF of 20-25 presents with left arm aching that began approximately 5 hours prior to arrival lasting 10-20 minutes and self resolving. History and exam as above. EKG with sinus rhythm and first-degree AV  block with what looked to be new changes in inferior and lateral leads. Initial troponin negative. Cardiology was consultation and reviewed his prior history and believe that these changes have been seen in the past. They recommended delta troponin. If negative they feel the patient would be safe for discharge with close cardiology follow-up. Delta troponin was negative. Other labs nonspecific. BMP at patient's baseline. Patient denied any trauma to the left arm. There is no evidence of edema to suggest thoracic outlet or DVT. Patient still remains asymptomatic.  Patient safe for discharge and given strict return precautions. Patient to follow-up with cardiology as scheduled.  Patient discharged in good condition.  Patient seen in conjunction with Dr. Francisco Capuchin, MD Resident.  Final diagnoses:  Left arm pain        Addison Lank, MD 08/28/14 [id number]  Malvin Johns, MD 08/31/14 [id number]

## 2014-08-27 NOTE — Telephone Encounter (Signed)
Pt called CHF clinic to inform he will be coming to ED for evaluation of left upper arm pain. Vilinda Blanks, RN and ED charge nurse informed.

## 2014-08-27 NOTE — Telephone Encounter (Signed)
Pt called he is having a pain in the left arm, no sob, no chest pain and he does not want to go to the ER.Caleb [name] Please advise

## 2014-08-27 NOTE — ED Provider Notes (Signed)
Patient with an extensive cardiac history presents with left arm pain. The pain started this morning about 10:30 and lasted on our. Described as an aching pain. He had no chest pain shortness of breath or other associated symptoms. It was nonexertional. His EKG however does show some new ischemic changes as compared to his prior EKG. We will consult cardiology for evaluation.  Caleb Johns, MD 08/27/14 [phone]

## 2014-08-27 NOTE — Discharge Instructions (Signed)
Musculoskeletal Pain Musculoskeletal pain is muscle and boney aches and pains. These pains can occur in any part of the body. Your caregiver may treat you without knowing the cause of the pain. They may treat you if blood or urine tests, X-rays, and other tests were normal.  CAUSES There is often not a definite cause or reason for these pains. These pains may be caused by a type of germ (virus). The discomfort may also come from overuse. Overuse includes working out too hard when your body is not fit. Boney aches also come from weather changes. Bone is sensitive to atmospheric pressure changes. HOME CARE INSTRUCTIONS   Ask when your test results will be ready. Make sure you get your test results.  Only take over-the-counter or prescription medicines for pain, discomfort, or fever as directed by your caregiver. If you were given medications for your condition, do not drive, operate machinery or power tools, or sign legal documents for 24 hours. Do not drink alcohol. Do not take sleeping pills or other medications that may interfere with treatment.  Continue all activities unless the activities cause more pain. When the pain lessens, slowly resume normal activities. Gradually increase the intensity and duration of the activities or exercise.  During periods of severe pain, bed rest may be helpful. Lay or sit in any position that is comfortable.  Putting ice on the injured area.  Put ice in a bag.  Place a towel between your skin and the bag.  Leave the ice on for 15 to 20 minutes, 3 to 4 times a day.  Follow up with your caregiver for continued problems and no reason can be found for the pain. If the pain becomes worse or does not go away, it may be necessary to repeat tests or do additional testing. Your caregiver may need to look further for a possible cause. SEEK IMMEDIATE MEDICAL CARE IF:  You have pain that is getting worse and is not relieved by medications.  You develop chest pain  that is associated with shortness or breath, sweating, feeling sick to your stomach (nauseous), or throw up (vomit).  Your pain becomes localized to the abdomen.  You develop any new symptoms that seem different or that concern you. MAKE SURE YOU:   Understand these instructions.  Will watch your condition.  Will get help right away if you are not doing well or get worse. Document Released: 03/09/2005 Document Revised: 06/01/2011 Document Reviewed: 11/11/2012 [facility] Patient Information 2015 [place], [place]. This information is not intended to replace advice given to you by your health care provider. Make sure you discuss any questions you have with your health care provider.

## 2014-08-27 NOTE — Consult Note (Signed)
Patient ID: Caleb [name] MRN: [id number], DOB/AGE: Feb 17, 1941   Admit date: 08/27/2014   Primary Physician: Joycelyn Man, MD Primary Cardiologist: Dr. Tammi Klippel  Pt. Profile:  74 year old Caucasian male with past medical history of CAD s/p CABG with LIMA to LAD, ICM with baseline EF 20-25%, h/o atrial fibrillation in 12/2012 and subsequent ablation at [facility] in 3/15, s/p mitral valve repair, and pulmonary hypertension has been followed by HF clinic present with L arm pain.  Problem List  Past Medical History  Diagnosis Date  . CAD (coronary artery disease)   . Atrial fibrillation   . Cardiomyopathy, ischemic   . Heart failure, systolic, acute on chronic   . Hypertension   . Hyperlipidemia   . Pleural effusion   . Positional vertigo   . Dizziness   . GERD (gastroesophageal reflux disease)   . Atypical pneumonia   . Cough   . Allergic rhinitis   . TIA (transient ischemic attack)   . OSA (obstructive sleep apnea)     Home sleep test 07/05/2009 AHI 8.2  . Chronic anticoagulation     Past Surgical History  Procedure Laterality Date  . Coronary stent placement  2004    LAD  . Shoulder surgery    . Tee without cardioversion N/A 09/21/2012    Procedure: TRANSESOPHAGEAL ECHOCARDIOGRAM (TEE);  Surgeon: Larey Dresser, MD;  Location: [facility] ENDOSCOPY;  Service: Cardiovascular;  Laterality: N/A;  . Coronary artery bypass graft  01/09/13    LAD LIMA, left atrial appendage  . Mitral valve annuloplasty  01/09/13  . Aortic valve repair  01/09/13  . Right heart catheterization N/A 05/11/2014    Procedure: RIGHT HEART CATH;  Surgeon: Larey Dresser, MD;  Location: [facility] CATH LAB;  Service: Cardiovascular;  Laterality: N/A;     Allergies  Allergies  Allergen Reactions  . Carvedilol Other (See Comments)    Dizziness, uneasiness, alopecia  . Pradaxa [Dabigatran Etexilate Mesylate] Rash  . Sulfonamide Derivatives Rash    REACTION: rash  . Xarelto [Rivaroxaban] Rash      HPI  The patient is a 74 year old Caucasian male with past medical history of CAD s/p CABG with LIMA to LAD, ICM with baseline EF 20-25%, h/o atrial fibrillation in 12/2012 and subsequent ablation at [facility] in 3/15, s/p mitral valve repair, and pulmonary hypertension has been followed by HF clinic. It appears his mitral repair and Maze procedure, CABG with LIMA to LAD, left atrial appendage closure was done at [facility] in [place]. He had a recent right heart cath showed a mildly elevated wedge pressure, moderate pulmonary hypertension and low cardiac index. VQ scan show no PE. He does have some moderate pulmonary hypertension and started Adcirca.   He has been doing fairly well at home and has been exercising regularly. He has not noticed any significant anginal symptom recently. He was on treadmill was on Saturday 08/25/2014. Patient presented to [facility] 08/27/2014 after having left arm pain around 10 AM. The symptom eventually resolved around 11 AM and has not recurred since. He denies any chest discomfort or shortness of breath. He denies any exertional component. He denies any exacerbating or alleviating factors. EKG shows ST depression in inferolateral lead which appears to be chronic. Trop negative. BNP 417 which is his baseline. CXR negative. Cardiology consulted for L arm pain.   Home Medications  Prior to Admission medications   Medication Sig Start Date End Date Taking? Authorizing Provider  Ascorbic Acid (  VITAMIN C) 500 MG CAPS Take 2 capsules by mouth daily.   Yes Historical Provider, MD  aspirin 81 MG chewable tablet Chew 81 mg by mouth daily.   Yes Historical Provider, MD  bisoprolol (ZEBETA) 5 MG tablet Take 0.5 tablets (2.5 mg total) by mouth daily. 07/04/14  Yes Jolaine Artist, MD  digoxin (LANOXIN) 0.125 MG tablet Take 1 tablet (0.125 mg total) by mouth daily. 05/24/14  Yes Larey Dresser, MD  eplerenone (INSPRA) 25 MG tablet Take 1 tablet (25 mg total) by  mouth daily. 08/06/14  Yes Larey Dresser, MD  furosemide (LASIX) 80 MG tablet Take 80 mg by mouth daily.   Yes Historical Provider, MD  Magnesium 100 MG CAPS Take 1 capsule by mouth 2 (two) times daily.    Yes Historical Provider, MD  nitroGLYCERIN (NITROSTAT) 0.4 MG SL tablet Place 0.4 mg under the tongue every 5 (five) minutes as needed. May repeat for up to 3 doses.    Yes Historical Provider, MD  Omega-3 Fatty Acids (THE VERY FINEST FISH OIL) LIQD Take 2-3 g by mouth daily.    Yes Historical Provider, MD  potassium chloride SA (K-DUR,KLOR-CON) 20 MEQ tablet Take 0.5 tablets (10 mEq total) by mouth daily. 08/09/14  Yes Larey Dresser, MD  Tadalafil, PAH, (ADCIRCA) 20 MG TABS Take 1 tablet (20 mg total) by mouth daily. 07/26/14  Yes Jolaine Artist, MD  valsartan (DIOVAN) 40 MG tablet 40 mg in the AM and 20 mg in the PM   Yes Historical Provider, MD  Zinc 100 MG TABS Take 1 tablet by mouth daily.    Yes Historical Provider, MD    Family History  Family History  Problem Relation Age of Onset  . Adopted: Yes  . Diabetes      Murray  . Heart disease Neg Hx   . Hypertension Neg Hx     Social History  History   Social History  . Marital Status: Married    Spouse Name: N/A  . Number of Children: N/A  . Years of Education: N/A   Occupational History  . PHYSICIAN     Psychologist   Social History Main Topics  . Smoking status: Never Smoker   . Smokeless tobacco: Never Used  . Alcohol Use: No  . Drug Use: No  . Sexual Activity: Not on file   Other Topics Concern  . Not on file   Social History Narrative   Married   Gets regular exercise     Review of Systems General:  No chills, fever, night sweats or weight changes.  Cardiovascular:  No chest pain, dyspnea on exertion, edema, orthopnea, palpitations, paroxysmal nocturnal dyspnea. +L arm pain. Occasional palpitation, but not today Dermatological: No rash, lesions/masses Respiratory: No cough, dyspnea Urologic: No  hematuria, dysuria Abdominal:   No nausea, vomiting, diarrhea, bright red blood per rectum, melena, or hematemesis Neurologic:  No visual changes, wkns, changes in mental status. All other systems reviewed and are otherwise negative except as noted above.  Physical Exam  Blood pressure 148/54, pulse 61, temperature 97.3 F (36.3 C), temperature source Oral, resp. rate 22, weight 154 lb 11.2 oz (70.171 kg), SpO2 99 %.  General: Pleasant, NAD Psych: Normal affect. Neuro: Alert and oriented X 3. Moves all extremities spontaneously. HEENT: Normal  Neck: Supple without bruits or JVD. Lungs:  Resp regular and unlabored, CTA. Heart: RRR no s3, s4, or murmurs. Abdomen: Soft, non-tender, non-distended, BS + x 4.  Extremities:  No clubbing, cyanosis. DP/PT/Radials 2+ and equal bilaterally. Compression stocking on, 1+ pitting edema on LLE (per pt chronic), no edema on Right  Labs  Troponin (Point of Care Test)  Recent Labs  08/27/14 1536  TROPIPOC 0.03   No results for input(s): CKTOTAL, CKMB, TROPONINI in the last 72 hours. Lab Results  Component Value Date   WBC 6.3 08/27/2014   HGB 16.0 08/27/2014   HCT 47.0 08/27/2014   MCV 88.9 08/27/2014   PLT 177 08/27/2014    Recent Labs Lab 08/22/14 1425 08/27/14 1537  NA 137 135  K 4.6 3.9  CL 97* 95*  CO2 30  --   BUN 16 23*  CREATININE 1.04 1.10  CALCIUM 9.6  --   GLUCOSE 89 111*   Lab Results  Component Value Date   CHOL 225* 05/11/2013   HDL 72.60 05/11/2013   LDLCALC 122* 04/19/2008   TRIG 39.0 05/11/2013   No results found for: DDIMER   Radiology/Studies  Dg Chest Port 1 View  08/27/2014   CLINICAL DATA:  Chest pain and left arm pain  EXAM: PORTABLE CHEST - 1 VIEW  COMPARISON:  05/21/2014  FINDINGS: Normal heart size and aortic contours. The patient is status post mitral valve replacement and left atrial exclusion. There is no edema, consolidation, effusion, or pneumothorax. No acute osseous findings.  IMPRESSION: No  active disease.   Electronically Signed   By: Monte Fantasia M.D.   On: 08/27/2014 15:58    ECG  NSR with chronic ST depression in inferolateral leads  Echocardiogram 04/19/2014  LV EF: 20% -  25%  ------------------------------------------------------------------- Indications:   CHF (I50.22).  ------------------------------------------------------------------- History:  PMH:  Dyspnea. Atrial fibrillation. Coronary artery disease. Hypertrophic cardiomyopathy. Risk factors: Bradycardia. OSA. GERD. Vertigo. Anxiety. Hypertension. Dyslipidemia.  ------------------------------------------------------------------- Study Conclusions  - Left ventricle: There is diffuse hypokineisis with mid anterior, anteroseptal, and anterolateral walls akinesis. All apical segments are dyskinetic. No obvious thrombus is seen in the LV apex. The cavity size was moderately dilated. Systolic function was severely reduced. The estimated ejection fraction was in the range of 20% to 25%. The study is not technically sufficient to allow evaluation of LV diastolic function. - Aortic valve: Trileaflet; normal thickness leaflets. Transvalvular velocity was within the normal range. There was no stenosis. There was moderate regurgitation. Valve area (VTI): 1.78 cm^2. Valve area (Vmax): 1.88 cm^2. Valve area (Vmean): 1.74 cm^2. - Mitral valve: S/P mitral valve repair with normal transmitral gradients and only mild regurgitation. There was mild regurgitation. Valve area by continuity equation (using LVOT flow): 1.31 cm^2. - Left atrium: The atrium was mildly dilated. - Right ventricle: The cavity size was mildly dilated. Wall thickness was normal. Systolic function was mildly reduced. - Right atrium: The atrium was moderately to severely dilated. - Pulmonary arteries: Systolic pressure was severely increased. PA peak pressure: 71 mm Hg (S).  Impressions:  -  Compared to the prior study from 04/14/2013 the LVEF is stable, however pulmonary hypertension is now severe (previosuly mild).    ASSESSMENT AND PLAN  1. L arm pain, which is atypical for his angina  - his previous angina is chest pressure without arm pain, his symptom this morning is very unusual for his angina  - Reviewed EKG back to 2015, seems ST depression in inferolateral leads is chronic. Although today's ST segment slightly more depressed however EKG was done around 3:30pm and his symptom resolved around 11AM  - discussed with Dr. Burt Knack, patient never had any CP,  good exercising capacity, not in any HF, L arm resolved, will recheck trop, if negative likely can be discharged.  2. CAD s/p CABG with LIMA to LAD 3. ICM with baseline EF 20-25% 4. h/o atrial fibrillation in 12/2012 and subsequent ablation at [facility] in 3/15 5. s/p mitral valve repair 6. pulmonary hypertension on Wadie Lessen, PA-C 08/27/2014, 6:16 PM  Patient seen, examined. Available data reviewed. Agree with findings, assessment, and plan as outlined by Almyra Deforest, PA-C. The patient is well-known to me from the outpatient setting. He is also followed by Dr Aundra Dubin for pulmonary HTN and heart failure. He has had sx's related to ischemic heart disease, CHF, and paroxysmal atrial fibrillation over the years. He's never had angina except when he had substernal chest pain at the time of his initial MI. Today he had 45 minutes to one hour of chest pain and now feels fine. Symptoms occurred at about 10:30 am today. His exam is unremarkable with clear lung fields and a normal cardiac exam. There is no peripheral edema. He has inferolateral ST-T changes and LVH but I don't appreciate any significant changes from his previous tracings. Initial troponin is negative. I would favor repeating a second troponin now and then if negative he can be discharged home with outpatient follow-up. Plan discussed with patient and his  wife and they both agree.  Sherren Mocha, M.D. 08/27/2014 6:47 PM

## 2014-08-27 NOTE — ED Notes (Signed)
MD at bedside

## 2014-08-27 NOTE — ED Notes (Signed)
Pt st's he had sudden onset of left arm pain this am.  Describes as a aching pain.  Pt has cardiac hx but did not have any chest pain.  Pt also has CHF hx

## 2014-08-27 NOTE — Telephone Encounter (Signed)
Patient called c/o left arm pain up near shoulder.  States it became noticeable approx 1 hr ago (1100 am).  Dull ache in nature, continuous, worse with movement, non radiating, still has full function of arm and hand on affected side.  Patient denies any other s/s and/or complaints and "otherwise feels fine".  Wanted to be seen by Dr. Haroldine Laws or Aundra Dubin today.  OUr clinic is booked, is not having heart failure symptoms, and [name] is out of town.  Advised to go to ED for eval if s/s do not subside or get worse.  Patient is reluctant to do so, and wants to wait it out at home with wife.  States he will call us back if this gets worse or does not subside before going to ED.  Renee Pain

## 2014-09-05 ENCOUNTER — Other Ambulatory Visit: Payer: Self-pay

## 2014-09-05 DIAGNOSIS — I25709 Atherosclerosis of coronary artery bypass graft(s), unspecified, with unspecified angina pectoris: Secondary | ICD-10-CM

## 2014-09-05 DIAGNOSIS — M79602 Pain in left arm: Secondary | ICD-10-CM

## 2014-09-05 DIAGNOSIS — Z9889 Other specified postprocedural states: Secondary | ICD-10-CM

## 2014-09-06 DIAGNOSIS — H2513 Age-related nuclear cataract, bilateral: Secondary | ICD-10-CM | POA: Diagnosis not present

## 2014-09-06 DIAGNOSIS — H3531 Nonexudative age-related macular degeneration: Secondary | ICD-10-CM | POA: Diagnosis not present

## 2014-09-07 ENCOUNTER — Other Ambulatory Visit (HOSPITAL_COMMUNITY): Payer: Self-pay | Admitting: *Deleted

## 2014-09-07 MED ORDER — TADALAFIL (PAH) 20 MG PO TABS: 20.0000 mg | ORAL_TABLET | Freq: Every day | ORAL | Status: DC

## 2014-09-13 ENCOUNTER — Ambulatory Visit (HOSPITAL_COMMUNITY): Payer: Medicare Other | Attending: Cardiovascular Disease

## 2014-09-13 ENCOUNTER — Other Ambulatory Visit: Payer: Self-pay

## 2014-09-13 DIAGNOSIS — H3530 Unspecified macular degeneration: Secondary | ICD-10-CM | POA: Insufficient documentation

## 2014-09-13 DIAGNOSIS — I10 Essential (primary) hypertension: Secondary | ICD-10-CM | POA: Diagnosis not present

## 2014-09-13 DIAGNOSIS — I255 Ischemic cardiomyopathy: Secondary | ICD-10-CM | POA: Insufficient documentation

## 2014-09-13 DIAGNOSIS — Z951 Presence of aortocoronary bypass graft: Secondary | ICD-10-CM | POA: Diagnosis not present

## 2014-09-13 DIAGNOSIS — M79602 Pain in left arm: Secondary | ICD-10-CM

## 2014-09-13 DIAGNOSIS — I272 Other secondary pulmonary hypertension: Secondary | ICD-10-CM | POA: Diagnosis not present

## 2014-09-13 DIAGNOSIS — I4891 Unspecified atrial fibrillation: Secondary | ICD-10-CM | POA: Insufficient documentation

## 2014-09-13 DIAGNOSIS — R001 Bradycardia, unspecified: Secondary | ICD-10-CM | POA: Diagnosis not present

## 2014-09-13 DIAGNOSIS — I25709 Atherosclerosis of coronary artery bypass graft(s), unspecified, with unspecified angina pectoris: Secondary | ICD-10-CM

## 2014-09-13 DIAGNOSIS — I2510 Atherosclerotic heart disease of native coronary artery without angina pectoris: Secondary | ICD-10-CM | POA: Diagnosis not present

## 2014-09-13 DIAGNOSIS — Z9889 Other specified postprocedural states: Secondary | ICD-10-CM | POA: Insufficient documentation

## 2014-09-13 DIAGNOSIS — I77810 Thoracic aortic ectasia: Secondary | ICD-10-CM | POA: Insufficient documentation

## 2014-09-13 DIAGNOSIS — E785 Hyperlipidemia, unspecified: Secondary | ICD-10-CM | POA: Insufficient documentation

## 2014-09-13 DIAGNOSIS — I082 Rheumatic disorders of both aortic and tricuspid valves: Secondary | ICD-10-CM | POA: Insufficient documentation

## 2014-09-13 MED ORDER — PERFLUTREN LIPID MICROSPHERE
1.0000 mL | INTRAVENOUS | Status: DC | PRN
  Administered 2014-09-13: 1 mL via INTRAVENOUS

## 2014-09-17 ENCOUNTER — Ambulatory Visit (HOSPITAL_COMMUNITY)
Admission: RE | Admit: 2014-09-17 | Discharge: 2014-09-17 | Disposition: A | Discharge status: Home / Self Care | Payer: Medicare Other | Source: Ambulatory Visit | Attending: Cardiology | Admitting: Cardiology

## 2014-09-17 VITALS — BP 120/52 | HR 56 | Wt 156.6 lb

## 2014-09-17 DIAGNOSIS — Z79899 Other long term (current) drug therapy: Secondary | ICD-10-CM | POA: Diagnosis not present

## 2014-09-17 DIAGNOSIS — I872 Venous insufficiency (chronic) (peripheral): Secondary | ICD-10-CM | POA: Insufficient documentation

## 2014-09-17 DIAGNOSIS — Z8249 Family history of ischemic heart disease and other diseases of the circulatory system: Secondary | ICD-10-CM | POA: Insufficient documentation

## 2014-09-17 DIAGNOSIS — I272 Other secondary pulmonary hypertension: Secondary | ICD-10-CM | POA: Insufficient documentation

## 2014-09-17 DIAGNOSIS — I252 Old myocardial infarction: Secondary | ICD-10-CM | POA: Insufficient documentation

## 2014-09-17 DIAGNOSIS — E785 Hyperlipidemia, unspecified: Secondary | ICD-10-CM | POA: Diagnosis not present

## 2014-09-17 DIAGNOSIS — I5022 Chronic systolic (congestive) heart failure: Secondary | ICD-10-CM | POA: Diagnosis not present

## 2014-09-17 DIAGNOSIS — I2510 Atherosclerotic heart disease of native coronary artery without angina pectoris: Secondary | ICD-10-CM | POA: Insufficient documentation

## 2014-09-17 DIAGNOSIS — I270 Primary pulmonary hypertension: Secondary | ICD-10-CM

## 2014-09-17 DIAGNOSIS — G4733 Obstructive sleep apnea (adult) (pediatric): Secondary | ICD-10-CM | POA: Diagnosis not present

## 2014-09-17 DIAGNOSIS — I351 Nonrheumatic aortic (valve) insufficiency: Secondary | ICD-10-CM | POA: Insufficient documentation

## 2014-09-17 DIAGNOSIS — I480 Paroxysmal atrial fibrillation: Secondary | ICD-10-CM | POA: Insufficient documentation

## 2014-09-17 DIAGNOSIS — Z8673 Personal history of transient ischemic attack (TIA), and cerebral infarction without residual deficits: Secondary | ICD-10-CM | POA: Diagnosis not present

## 2014-09-17 DIAGNOSIS — I6523 Occlusion and stenosis of bilateral carotid arteries: Secondary | ICD-10-CM | POA: Insufficient documentation

## 2014-09-17 DIAGNOSIS — G629 Polyneuropathy, unspecified: Secondary | ICD-10-CM | POA: Insufficient documentation

## 2014-09-17 DIAGNOSIS — Z7982 Long term (current) use of aspirin: Secondary | ICD-10-CM | POA: Insufficient documentation

## 2014-09-17 DIAGNOSIS — K219 Gastro-esophageal reflux disease without esophagitis: Secondary | ICD-10-CM | POA: Insufficient documentation

## 2014-09-17 DIAGNOSIS — Z951 Presence of aortocoronary bypass graft: Secondary | ICD-10-CM | POA: Diagnosis not present

## 2014-09-17 DIAGNOSIS — I255 Ischemic cardiomyopathy: Secondary | ICD-10-CM | POA: Insufficient documentation

## 2014-09-17 MED ORDER — SACUBITRIL-VALSARTAN 24-26 MG PO TABS: 1.0000 | ORAL_TABLET | Freq: Two times a day (BID) | ORAL | Status: DC

## 2014-09-17 NOTE — Patient Instructions (Signed)
STOP VALSARTAN Start Entresto 24/26 mg, one pill twice a day  Labs needed in 10 days  Your physician recommends that you schedule a follow-up appointment in: 2 months  Do the following things EVERYDAY: 1) Weigh yourself in the morning before breakfast. Write it down and keep it in a log. 2) Take your medicines as prescribed 3) Eat low salt foods-Limit salt (sodium) to 2000 mg per day.  4) Stay as active as you can everyday 5) Limit all fluids for the day to less than 2 liters 6)

## 2014-09-17 NOTE — Progress Notes (Signed)
Patient ID: Caleb [name], male   DOB: [birth year], 74 y.o.   MRN: [id number] PCP: Dr. Sherren Mocha EP: [name]  74 yo with complex past history presents for heart failure evaluation.  Patient had anterior MI in 2004 and developed ischemic cardiomyopathy as well as mitral regurgitation. He also developed atrial fibrillation.  In 10/14, he had MV repair, Maze, CABG with LIMA-LAD, and LA appendage closure at [facility] in [place].  Subsequently, atrial fibrillation returned and he had an atrial fibrillation ablation in [facility] in 3/15.  He wore a Zio patch in 12/15 and had a low atrial fibrillation burden of 7%.  He has had a long-standing ischemic cardiomyopathy.  In 2015, EF was 20-25%.  Echo in 03/2014 also showed EF 25-30% but estimated PA pressure suggested severe pulmonary hypertension, which is new for him.    At initial appointment, he reported increased exertional dyspnea over a number of weeks.  RHC was done, showing mildly elevated PCWP with moderate pulmonary HTN and low cardiac index (1.93 thermo, 2.13 Fick).  V/Q scan showed no PE.  I started him on digoxin and have titrated his Lasix to 80 mg daily.  He is now on bisoprolol and able to tolerate it.  At a prior appointment, I tried to increase his valsartan to 40 bid, but he felt fatigued and lightheaded so has decreased it to 40 qam, 20 qpm.  He can tolerate this.  CPX in 4/16 showed mildly decreased functional capacity.  Most recent echo in 6/16 showed stable low EF 20-25% with moderately dysfunction RV and PA systolic pressure 78 mmHg.   He is doing reasonably well.  He thinks that Joanie Coddington has helped his breathing.  He is not short of breath walking on flat ground, mild dyspnea with hills.  No chest pain.  No orthopnea, PND, or bendopnea.  Sleep study showed moderate OSA, wants to talk more with Dr Radford Pax before starting CPAP.  No palpitations.  Weight is down 2 lbs.   6 minute walk (3/16): 381 m  6 minute walk (5/16): 414.5 m  Labs  (2/15): LDL 144 Labs (8/15): K 4.6, creatinine 0.9 Labs (12/15): HCT 42.3   Labs (2/16): K 4 => 4.2, creatinine 1.05 => 0.92, BNP 268 Labs (3/16): BNP 495 => 320, digoxin 0.4, RF 14.7 (very mild increase), TSH normal, HIV negative, anti-SCL70 negative, creatinine 0.91, K 4.4 Labs (6/16): K 4.6, creatinine 1.04, BNP 418, HCT 42.5  PMH: 1. CAD: Anterior MI in 2004.  Cardiac surgery in 10/14 included LIMA-LAD.  2. Chronic mitral regurgitation: 10/14 surgery at [facility] with MV repair, Maze, LIMA-LAD, and LA appendage closure.  3. Atrial fibrillation: Paroxysmal.  H/o Maze in 2014.  Had recurrent atrial fibrillation with ablation in 3/15 by Dr Ola Spurr in [facility].  Not anticoagulated after 2 severe prostate bleeding episodes.  - Zio patch in 12/15 with low atrial fibrillation burden (7%).  4. Chronotropic incompetence.  5. HTN 6. Hyperlipidemia: Refuses statin.  7. Peripheral neuropathy 8. GERD 9. H/o BPPV 10. H/o TIA 11. Ischemic cardiomyopathy: cardiac MRI 7/14 with EF 35%, moderate MR, normal RV size and systolic function, extensive anterior and anteroseptal LGE suggestive of non-viable myocardium (this was prior to LIMA-LAD).  Echo 1/15 with EF 20-25%, moderate AI, PA systolic pressure 39 mmHg. Echo (1/16) with EF 20-25%, diffuse hypokinesis with regionality, moderate LV dilation, moderate AI, s/p MV repair with mild MR and normal gradients, RV dilated with mildly decreased systolic function, PA systolic pressure 71  mmHg.  RHC (2/16) with mean RA 6, PA 61/25 mean 40, mean PCWP 23, CI 2.13/PVR 4.3 (Fick), CI 1.93/PVR 4.7 (thermo).  CPX (4/16) with peak VO2 17.9, VE/VCO2 34.7 => mildly decreased functional capacity.  Echo (6/16) with EF 20-25%, severe LV dilation, akinesis of the anteroseptum, inferior wall, and apex; moderate AI, s/p MV repair (ok), moderately dilated and moderately dysfunctional RV, PA systolic pressure 78.  - Spironolactone apparently caused cognitive deficits -  Intolerant of Coreg due to development of severe alopecia 12. Carotid dopplers (1/16) with 40-59% bilateral ICA stenosis.  13. Aortic insufficiency: Moderate by last echo 6/16.  14. Pulmonary HTN: Mixed PAH and pulmonary venous hypertension.  PFTs (9/14) were normal.  V/Q scan (2/16) with no evidence of acute or chronic PE.  6 minute walk (3/16) 381 m. 6 minute walk (5/16) 414.5 m.  15. OSA: Moderate on 5/16 sleep study.  16. Venous insufficiency  SH: Married, lives in [place], Careers information officer, nonsmoker  FH: CAD  ROS: All systems reviewed and negative except as per HPI.   Current Outpatient Prescriptions  Medication Sig Dispense Refill  . Ascorbic Acid (VITAMIN C) 500 MG CAPS Take 2 capsules by mouth daily.    Marland Kitchen aspirin 81 MG chewable tablet Chew 81 mg by mouth daily.    . bisoprolol (ZEBETA) 5 MG tablet Take 0.5 tablets (2.5 mg total) by mouth daily. 15 tablet 3  . digoxin (LANOXIN) 0.125 MG tablet Take 1 tablet (0.125 mg total) by mouth daily. 90 tablet 3  . eplerenone (INSPRA) 25 MG tablet Take 1 tablet (25 mg total) by mouth daily. 30 tablet 6  . furosemide (LASIX) 80 MG tablet Take 80 mg by mouth daily.    . Magnesium 100 MG CAPS Take 1 capsule by mouth 2 (two) times daily.     . nitroGLYCERIN (NITROSTAT) 0.4 MG SL tablet Place 0.4 mg under the tongue every 5 (five) minutes as needed. May repeat for up to 3 doses.     . Omega-3 Fatty Acids (THE VERY FINEST FISH OIL) LIQD Take 2-3 g by mouth daily.     . potassium chloride SA (K-DUR,KLOR-CON) 20 MEQ tablet Take 0.5 tablets (10 mEq total) by mouth daily. 30 tablet 3  . Tadalafil, PAH, (ADCIRCA) 20 MG TABS Take 1 tablet (20 mg total) by mouth daily. 30 tablet 6  . Zinc 100 MG TABS Take 1 tablet by mouth daily.     . sacubitril-valsartan (ENTRESTO) 24-26 MG Take 1 tablet by mouth 2 (two) times daily. 60 tablet 6   No current facility-administered medications for this encounter.   BP 120/52 mmHg  Pulse 56  Wt 156 lb 9.6  oz (71.033 kg)  SpO2 98% General: NAD Neck: No JVD, no thyromegaly or thyroid nodule.  Lungs: Clear to auscultation bilaterally with normal respiratory effort. CV: Nondisplaced PMI.  Heart regular S1/S2, no S3/S4, 1/6 HSM apex, 2/6 diastolic murmur along the sternal border.  1+ edema left ankle, trace edema on right.  No carotid bruit.  Normal pedal pulses.  Abdomen: Soft, nontender, no hepatosplenomegaly, no distention.  Skin: Intact without lesions or rashes.  Neurologic: Alert and oriented x 3.  Psych: Normal affect. Extremities: No clubbing or cyanosis.  HEENT: Normal.   Assessment/Plan: 1. CAD: This seems stable, no chest pain. S/p LIMA-LAD.  He is on ASA 81.  He has decided not to take statins after reviewing the data.   2. S/p mitral valve repair: The MV repair looked stable on  recent echo. 3. Aortic insufficiency: Moderate on recent echo, similar to prior.  Diastolic murmur on exam.  4. Chronic systolic CHF: Ischemic cardiomyopathy, EF 20-25%.  This is similar to the past.  However, estimated PA pressure was higher on 1/16 echo than in past and symptoms worsened.  RHC showed moderate pulmonary hypertension.  Now doing better with medication adjustment and NYHA class II. Mildly decreased functional capacity on 4/16 CPX.  On exam today, he is not significantly volume overloaded. - Continue digoxin, check level today.  - He has tolerated bisoprolol, continue 2.5 mg daily.  HR in 50s so will not increase. - I am going to have him stop valsartan and start Entresto 24/26 mg bid.  BMET in 10 days.   - He has tolerated initiation of eplerenone with not problems.  - Continue Lasix 80 mg daily.   - He does not want an ICD and understands the reasoning for having one.  - In the future, he could be an LVAD candidate.  - He is interested in stem cell therapy and has been doing research on possible clinical trials.  - He asks to have vitamin D level checked due to recent evidence for treatment of  vitamin D deficiency being linked to improved heart failure outcomes.  I will check this with next labs.  5. Atrial fibrillation: s/p Maze in 10/14, then ablation in 3/15. 7% atrial fibrillation on Zio patch in 12/15.  At a prior appointment, he felt like he was having frequent runs of atrial fibrillation.  This seems to have improved with less palpitations now.   - In the future, he could be a Tikosyn candidate if he has more atrial fibrillation.  - Currently, he is not anticoagulated due to history of prostate bleeding.  6. Pulmonary hypertension: Severe by echo in 1/16 and in 6/16 (was mild on prior echo in 1/15), moderate by recent RHC.  Mixed pulmonary venous and pulmonary arterial HTN.  It is possible that the [facility] component is due to pulmonary vascular remodeling in the setting of chronic mitral regurgitation prior to MV repair. Negative V/Q scan, no evidence for CTEPH.  PFTs normal in 9/14.  He is seeing Dr Lake Bells.  Recent sleep study showed moderate OSA.  He has started on Adcirca and is feeling like it has helped.  - I talked to him about increasing Adcirca but he wants to keep the dose the same for now.  - Needs to talk with sleep medicine about treatment of OSA.   Followup with me in 2 months  Loralie Champagne 09/17/2014

## 2014-09-26 ENCOUNTER — Ambulatory Visit (HOSPITAL_COMMUNITY)
Admission: RE | Admit: 2014-09-26 | Discharge: 2014-09-26 | Disposition: A | Discharge status: Home / Self Care | Payer: Medicare Other | Source: Ambulatory Visit | Attending: Cardiology | Admitting: Cardiology

## 2014-09-26 DIAGNOSIS — I5022 Chronic systolic (congestive) heart failure: Secondary | ICD-10-CM

## 2014-09-26 DIAGNOSIS — I5032 Chronic diastolic (congestive) heart failure: Secondary | ICD-10-CM | POA: Insufficient documentation

## 2014-09-26 LAB — BASIC METABOLIC PANEL
Anion gap: 7 (ref 5–15)
BUN: 13 mg/dL (ref 6–20)
CHLORIDE: 98 mmol/L — AB (ref 101–111)
CO2: 27 mmol/L (ref 22–32)
Calcium: 9 mg/dL (ref 8.9–10.3)
Creatinine, Ser: 1.05 mg/dL (ref 0.61–1.24)
GFR calc Af Amer: >60 mL/min (ref >60)
GFR calc non Af Amer: >60 mL/min (ref >60)
GLUCOSE: 103 mg/dL — AB (ref 65–99)
POTASSIUM: 5 mmol/L (ref 3.5–5.1)
SODIUM: 132 mmol/L — AB (ref 135–145)

## 2014-09-26 LAB — DIGOXIN LEVEL: DIGOXIN LVL: 0.4 ng/mL — AB (ref 0.8–2.0)

## 2014-09-26 LAB — BRAIN NATRIURETIC PEPTIDE: B Natriuretic Peptide: 454.7 pg/mL — ABNORMAL HIGH (ref 0.0–100.0)

## 2014-09-26 LAB — VITAMIN B12: Vitamin B-12: 720 pg/mL (ref 180–914)

## 2014-09-27 LAB — VITAMIN D 25 HYDROXY (VIT D DEFICIENCY, FRACTURES): Vit D, 25-Hydroxy: 75.4 ng/mL (ref 30.0–100.0)

## 2014-10-02 ENCOUNTER — Telehealth (HOSPITAL_COMMUNITY): Payer: Self-pay | Admitting: *Deleted

## 2014-10-02 NOTE — Telephone Encounter (Signed)
Completed PA for pt's Caleb [name], med approved through 10/02/15, ref # [id number], pharmacy aware

## 2014-10-03 ENCOUNTER — Telehealth (HOSPITAL_COMMUNITY): Payer: Self-pay

## 2014-10-03 MED ORDER — VALSARTAN 40 MG PO TABS: ORAL_TABLET | ORAL | Status: DC

## 2014-10-03 NOTE — Telephone Encounter (Addendum)
Patient called to report possible side effect/reaction to [facility] since starting medication 12 days ago.  Says he has felt like he "had been on a bad trip on and off, can't sleep, lightheaded, can't collect thoughts or keep conversation, and heart rate is weird on and off".  Patient took himself off Entresto and restarted Diavan 4m in am and 270min pm at previous dose before we transitioned him to [place].  Per Dr. McAundra Dubinpatient advised to continue Diavan and remain off Entresto to see if this made symptoms subside.  Patient will contact usKorean the next few days to give usKoreapdate.  If patient does not feel better, will need to be seen.  BrRenee Pain

## 2014-10-11 ENCOUNTER — Telehealth (HOSPITAL_COMMUNITY): Payer: Self-pay | Admitting: *Deleted

## 2014-10-11 NOTE — Telephone Encounter (Signed)
Pt called and said that his weight was up 2 pounds, and some LE edema. Said he feels like he hears some congestion while laying down. Told patient that I would ask and follow up with patient.   Spoke with Amy NP, she said to take 78m BID for 2 days.   Spoke with patient and he is aware. Told pt to call if continue to have any issues.

## 2014-10-16 ENCOUNTER — Encounter: Payer: Self-pay | Admitting: Cardiology

## 2014-10-26 ENCOUNTER — Other Ambulatory Visit: Payer: Self-pay | Admitting: Cardiology

## 2014-11-16 ENCOUNTER — Ambulatory Visit (HOSPITAL_COMMUNITY)
Admission: RE | Admit: 2014-11-16 | Discharge: 2014-11-16 | Disposition: A | Discharge status: Home / Self Care | Payer: Medicare Other | Source: Ambulatory Visit | Attending: Cardiology | Admitting: Cardiology

## 2014-11-16 DIAGNOSIS — I480 Paroxysmal atrial fibrillation: Secondary | ICD-10-CM | POA: Diagnosis not present

## 2014-11-16 DIAGNOSIS — Z9889 Other specified postprocedural states: Secondary | ICD-10-CM | POA: Diagnosis not present

## 2014-11-16 DIAGNOSIS — I2510 Atherosclerotic heart disease of native coronary artery without angina pectoris: Secondary | ICD-10-CM | POA: Insufficient documentation

## 2014-11-16 DIAGNOSIS — Z7982 Long term (current) use of aspirin: Secondary | ICD-10-CM | POA: Insufficient documentation

## 2014-11-16 DIAGNOSIS — Z79899 Other long term (current) drug therapy: Secondary | ICD-10-CM | POA: Insufficient documentation

## 2014-11-16 DIAGNOSIS — G629 Polyneuropathy, unspecified: Secondary | ICD-10-CM | POA: Diagnosis not present

## 2014-11-16 DIAGNOSIS — I272 Other secondary pulmonary hypertension: Secondary | ICD-10-CM | POA: Diagnosis not present

## 2014-11-16 DIAGNOSIS — I255 Ischemic cardiomyopathy: Secondary | ICD-10-CM | POA: Insufficient documentation

## 2014-11-16 DIAGNOSIS — G4733 Obstructive sleep apnea (adult) (pediatric): Secondary | ICD-10-CM | POA: Insufficient documentation

## 2014-11-16 DIAGNOSIS — E785 Hyperlipidemia, unspecified: Secondary | ICD-10-CM | POA: Diagnosis not present

## 2014-11-16 DIAGNOSIS — Z8249 Family history of ischemic heart disease and other diseases of the circulatory system: Secondary | ICD-10-CM | POA: Diagnosis not present

## 2014-11-16 DIAGNOSIS — K219 Gastro-esophageal reflux disease without esophagitis: Secondary | ICD-10-CM | POA: Insufficient documentation

## 2014-11-16 DIAGNOSIS — I351 Nonrheumatic aortic (valve) insufficiency: Secondary | ICD-10-CM | POA: Diagnosis not present

## 2014-11-16 DIAGNOSIS — Z951 Presence of aortocoronary bypass graft: Secondary | ICD-10-CM

## 2014-11-16 DIAGNOSIS — I509 Heart failure, unspecified: Secondary | ICD-10-CM

## 2014-11-16 DIAGNOSIS — Z8673 Personal history of transient ischemic attack (TIA), and cerebral infarction without residual deficits: Secondary | ICD-10-CM | POA: Insufficient documentation

## 2014-11-16 DIAGNOSIS — I252 Old myocardial infarction: Secondary | ICD-10-CM | POA: Diagnosis not present

## 2014-11-16 DIAGNOSIS — I5022 Chronic systolic (congestive) heart failure: Secondary | ICD-10-CM

## 2014-11-16 MED ORDER — VALSARTAN 40 MG PO TABS: 40.0000 mg | ORAL_TABLET | Freq: Two times a day (BID) | ORAL | Status: DC

## 2014-11-16 NOTE — Patient Instructions (Signed)
INCREASE Valsartan to 40 mg, one tab twice a day STOP Potassium   Labs needed, ok to have them drawn at the [facility] physician recommends that you schedule a follow-up appointment in: 2 months  You have been referred to Dr. Radford Pax for sleep evaluation/sleep medicine   Do the following things EVERYDAY: 1) Weigh yourself in the morning before breakfast. Write it down and keep it in a log. 2) Take your medicines as prescribed 3) Eat low salt foods-Limit salt (sodium) to 2000 mg per day.  4) Stay as active as you can everyday 5) Limit all fluids for the day to less than 2 liters 6)

## 2014-11-18 NOTE — Progress Notes (Signed)
Patient ID: Caleb [name], male   DOB: 07/04/1940, 74 y.o.   MRN: [id number] PCP: Dr. Sherren Mocha EP: [name]  74 yo with complex past history presents for heart failure evaluation.  Patient had anterior MI in 2004 and developed ischemic cardiomyopathy as well as mitral regurgitation. He also developed atrial fibrillation.  In 10/14, he had MV repair, Maze, CABG with LIMA-LAD, and LA appendage closure at [facility] in [place].  Subsequently, atrial fibrillation returned and he had an atrial fibrillation ablation in [facility] in 3/15.  He wore a Zio patch in 12/15 and had a low atrial fibrillation burden of 7%.  He has had a long-standing ischemic cardiomyopathy.  In 2015, EF was 20-25%.  Echo in 2016 also showed EF 25-30% but estimated PA pressure suggested severe pulmonary hypertension, which is new for him.    At initial appointment, he reported increased exertional dyspnea over a number of weeks.  RHC was done, showing mildly elevated PCWP with moderate pulmonary HTN and low cardiac index (1.93 thermo, 2.13 Fick).  V/Q scan showed no PE.  I started him on digoxin and have titrated his Lasix to 80 mg daily.  He is now on bisoprolol and able to tolerate it.  At last appointment, I had him try replacing valsartan with Entresto 24/26 bid.  He was unable to tolerate it (made him dizzy, though BP was not low).  Therefore, I had him restart valsartan.  CPX in 4/16 showed mildly decreased functional capacity.  He has tolerated Adcirca 20 mg daily.   He continues to do well generally.  HR in 50s.  BP stable.  Not dyspneic except with heavy exertion.  He wants to discuss sleep apnea with Dr Radford Pax before starting CPAP.  Some fatigue in the early evenings.  No orthopnea/PND.  No chest pain.   No lightheadedness, no palpitations.    6 minute walk (3/16): 381 m  6 minute walk (5/16): 414.5 m  Labs (2/15): LDL 144 Labs (8/15): K 4.6, creatinine 0.9 Labs (12/15): HCT 42.3   Labs (2/16): K 4 => 4.2, creatinine  1.05 => 0.92, BNP 268 Labs (3/16): BNP 495 => 320, digoxin 0.4, RF 14.7 (very mild increase), TSH normal, HIV negative, anti-SCL70 negative, creatinine 0.91, K 4.4 Labs (7/16): K 5, creatinine 1.05, BNP 455, vitamin D normal, digoxin 0.4, B12 normal  PMH: 1. CAD: Anterior MI in 2004.  Cardiac surgery in 10/14 included LIMA-LAD.  2. Chronic mitral regurgitation: 10/14 surgery at [facility] with MV repair, Maze, LIMA-LAD, and LA appendage closure.  3. Atrial fibrillation: Paroxysmal.  H/o Maze in 2014.  Had recurrent atrial fibrillation with ablation in 3/15 by Dr Ola Spurr in [facility].  Not anticoagulated after 2 severe prostate bleeding episodes.  - Zio patch in 12/15 with low atrial fibrillation burden (7%).  4. Chronotropic incompetence.  5. HTN 6. Hyperlipidemia: Refuses statin.  7. Peripheral neuropathy 8. GERD 9. H/o BPPV 10. H/o TIA 11. Ischemic cardiomyopathy: cardiac MRI 7/14 with EF 35%, moderate MR, normal RV size and systolic function, extensive anterior and anteroseptal LGE suggestive of non-viable myocardium (this was prior to LIMA-LAD).  Echo 1/15 with EF 20-25%, moderate AI, PA systolic pressure 39 mmHg. Echo (1/16) with EF 20-25%, diffuse hypokinesis with regionality, moderate LV dilation, moderate AI, s/p MV repair with mild MR and normal gradients, RV dilated with mildly decreased systolic function, PA systolic pressure 71 mmHg.  RHC (2/16) with mean RA 6, PA 61/25 mean 40, mean PCWP 23, CI  2.13/PVR 4.3 (Fick), CI 1.93/PVR 4.7 (thermo).  CPX (4/16) with peak VO2 17.9, VE/VCO2 34.7 => mildly decreased functional capacity.  - Spironolactone apparently caused cognitive deficits - Intolerant of Coreg due to development of severe alopecia 12. Carotid dopplers (1/16) with 40-59% bilateral ICA stenosis.  13. Aortic insufficiency: Moderate by last echo 2/16.  14. Pulmonary HTN: Mixed PAH and pulmonary venous hypertension.  PFTs (9/14) were normal.  V/Q scan (2/16) with no evidence  of acute or chronic PE.  6 minute walk (3/16) 381 m. 6 minute walk (5/16) 414.5 m.  15. OSA: Moderate on 5/16 sleep study.  16. Venous insufficiency  SH: Married, lives in [place], Careers information officer, nonsmoker  FH: CAD  ROS: All systems reviewed and negative except as per HPI.   Current Outpatient Prescriptions  Medication Sig Dispense Refill  . Ascorbic Acid (VITAMIN C) 500 MG CAPS Take 2 capsules by mouth daily.    Marland Kitchen aspirin 81 MG chewable tablet Chew 81 mg by mouth daily.    . bisoprolol (ZEBETA) 5 MG tablet Take 0.5 tablets (2.5 mg total) by mouth daily. 15 tablet 3  . digoxin (LANOXIN) 0.125 MG tablet Take 1 tablet (0.125 mg total) by mouth daily. 90 tablet 3  . eplerenone (INSPRA) 25 MG tablet Take 1 tablet (25 mg total) by mouth daily. 30 tablet 6  . furosemide (LASIX) 40 MG tablet Take 2 tablets (80 mg total) by mouth daily. 60 tablet 6  . furosemide (LASIX) 80 MG tablet Take 80 mg by mouth daily.    . Magnesium 100 MG CAPS Take 1 capsule by mouth 2 (two) times daily.     . nitroGLYCERIN (NITROSTAT) 0.4 MG SL tablet Place 0.4 mg under the tongue every 5 (five) minutes as needed. May repeat for up to 3 doses.     . Omega-3 Fatty Acids (THE VERY FINEST FISH OIL) LIQD Take 2-3 g by mouth daily.     . Tadalafil, PAH, (ADCIRCA) 20 MG TABS Take 1 tablet (20 mg total) by mouth daily. 30 tablet 6  . valsartan (DIOVAN) 40 MG tablet Take 1 tablet (40 mg total) by mouth 2 (two) times daily. 60 tablet 3  . Zinc 100 MG TABS Take 1 tablet by mouth daily.      No current facility-administered medications for this encounter.   There were no vitals taken for this visit. General: NAD Neck: No JVD, no thyromegaly or thyroid nodule.  Lungs: Clear to auscultation bilaterally with normal respiratory effort. CV: Nondisplaced PMI.  Heart regular S1/S2, no S3/S4, 1/6 HSM apex, 2/6 diastolic murmur along the sternal border.  1+ edema left ankle, trace edema on right.  No carotid bruit.  Normal  pedal pulses.  Abdomen: Soft, nontender, no hepatosplenomegaly, no distention.  Skin: Intact without lesions or rashes.  Neurologic: Alert and oriented x 3.  Psych: Normal affect. Extremities: No clubbing or cyanosis.  HEENT: Normal.   Assessment/Plan: 1. CAD: This seems stable, no chest pain. S/p LIMA-LAD.  He is on ASA 81.  He has decided not to take statins after reviewing the data.   2. S/p mitral valve repair: The MV repair looked stable on last echo with mild MR and no evidence for significant mitral stenosis.  3. Aortic insufficiency: Moderate on last echo, similar to prior.  Diastolic murmur on exam.  4. Chronic systolic CHF: Ischemic cardiomyopathy, EF 20-25%.  This is similar to the past.  However, estimated PA pressure was higher on 1/16 echo than in  past and symptoms worsened.  RHC showed moderate pulmonary hypertension.  Now doing better with medication adjustment and NYHA class II. Mildly decreased functional capacity on 4/16 CPX.  On exam today, he is not significantly volume overloaded. - Continue digoxin, check level today.  - He has tolerated bisoprolol, continue 2.5 mg daily.  - Increase valsartan to 40 mg bid (he did not tolerate Entresto).    - Continue eplerenone.   - D/c KCl, borderline elevated level.  - Continue Lasix 80 mg daily, BMET/BNP today.   - He does not want an ICD and understands the reasoning for having one.  - In the future, he could be an LVAD candidate.  - He is interested in stem cell therapy and has been doing research on possible clinical trials.  5. Atrial fibrillation: s/p Maze in 10/14, then ablation in 3/15. 7% atrial fibrillation on Zio patch in 12/15.  At a prior appointment, he felt like he was having frequent runs of atrial fibrillation.  This seems to have improved with less palpitations now.   - In the future, he could be a Tikosyn candidate if he has more atrial fibrillation.  - Currently, he is not anticoagulated due to history of prostate  bleeding.  6. Pulmonary hypertension: Severe by echo (was mild on prior echo in 1/15), moderate by last RHC.  Mixed pulmonary venous and pulmonary arterial HTN.  It is possible that the [facility] component is due to pulmonary vascular remodeling in the setting of chronic mitral regurgitation prior to MV repair. Negative V/Q scan, no evidence for CTEPH.  PFTs normal in 9/14.  He is seeing Dr Lake Bells.  Recent sleep study showed moderate OSA.  He has started on Adcirca and is feeling like it has helped, he does not want to increase it to 40 mg daily.  6 minute walk to be done at next appointment.  7. OSA: Moderate.  He wants to talk to Dr Radford Pax about treatment options.   Followup with me in 2 months.   Loralie Champagne 11/18/2014

## 2014-11-19 ENCOUNTER — Other Ambulatory Visit: Payer: Medicare Other

## 2014-11-19 DIAGNOSIS — I509 Heart failure, unspecified: Secondary | ICD-10-CM | POA: Diagnosis not present

## 2014-11-19 DIAGNOSIS — D539 Nutritional anemia, unspecified: Secondary | ICD-10-CM | POA: Diagnosis not present

## 2014-11-19 DIAGNOSIS — Z0000 Encounter for general adult medical examination without abnormal findings: Secondary | ICD-10-CM | POA: Diagnosis not present

## 2014-11-19 DIAGNOSIS — E559 Vitamin D deficiency, unspecified: Secondary | ICD-10-CM | POA: Diagnosis not present

## 2014-11-19 DIAGNOSIS — R5383 Other fatigue: Secondary | ICD-10-CM | POA: Diagnosis not present

## 2014-11-19 DIAGNOSIS — Z8249 Family history of ischemic heart disease and other diseases of the circulatory system: Secondary | ICD-10-CM | POA: Diagnosis not present

## 2014-11-19 LAB — DIGOXIN LEVEL: Digoxin Level: 0.8 ug/L (ref 0.8–2.0)

## 2014-11-19 LAB — BASIC METABOLIC PANEL
BUN: 16 mg/dL (ref 7–25)
CO2: 26 mmol/L (ref 20–31)
Calcium: 9.4 mg/dL (ref 8.6–10.3)
Chloride: 98 mmol/L (ref 98–110)
Creat: 0.99 mg/dL (ref 0.70–1.18)
GLUCOSE: 90 mg/dL (ref 65–99)
Potassium: 4.2 mmol/L (ref 3.5–5.3)
Sodium: 133 mmol/L — ABNORMAL LOW (ref 135–146)

## 2014-11-20 LAB — BRAIN NATRIURETIC PEPTIDE: Brain Natriuretic Peptide: 304.7 pg/mL — ABNORMAL HIGH (ref 0.0–100.0)

## 2014-11-21 ENCOUNTER — Telehealth (HOSPITAL_COMMUNITY): Payer: Self-pay | Admitting: *Deleted

## 2014-11-21 ENCOUNTER — Encounter: Payer: Self-pay | Admitting: Cardiology

## 2014-11-21 NOTE — Telephone Encounter (Signed)
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 Radford Pax. He does not want to agree to any treatment of sleep apnea until he had a face to face meeting with [facility]. I have scheduled him for October 13th!    Thanks! Talbert Cage, [place]

## 2014-12-03 ENCOUNTER — Telehealth (HOSPITAL_COMMUNITY): Payer: Self-pay | Admitting: Cardiology

## 2014-12-03 NOTE — Telephone Encounter (Signed)
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

## 2014-12-05 ENCOUNTER — Encounter (HOSPITAL_COMMUNITY): Payer: Self-pay

## 2014-12-05 ENCOUNTER — Ambulatory Visit (HOSPITAL_COMMUNITY)
Admission: RE | Admit: 2014-12-05 | Discharge: 2014-12-05 | Disposition: A | Discharge status: Home / Self Care | Payer: Medicare Other | Source: Ambulatory Visit | Attending: Internal Medicine | Admitting: Internal Medicine

## 2014-12-05 ENCOUNTER — Ambulatory Visit (INDEPENDENT_AMBULATORY_CARE_PROVIDER_SITE_OTHER): Payer: Medicare Other

## 2014-12-05 VITALS — BP 138/52 | HR 65 | Wt 153.4 lb

## 2014-12-05 DIAGNOSIS — Z8249 Family history of ischemic heart disease and other diseases of the circulatory system: Secondary | ICD-10-CM | POA: Insufficient documentation

## 2014-12-05 DIAGNOSIS — Z9889 Other specified postprocedural states: Secondary | ICD-10-CM

## 2014-12-05 DIAGNOSIS — I252 Old myocardial infarction: Secondary | ICD-10-CM | POA: Diagnosis not present

## 2014-12-05 DIAGNOSIS — K219 Gastro-esophageal reflux disease without esophagitis: Secondary | ICD-10-CM | POA: Insufficient documentation

## 2014-12-05 DIAGNOSIS — I5022 Chronic systolic (congestive) heart failure: Secondary | ICD-10-CM | POA: Insufficient documentation

## 2014-12-05 DIAGNOSIS — R002 Palpitations: Secondary | ICD-10-CM

## 2014-12-05 DIAGNOSIS — Z7982 Long term (current) use of aspirin: Secondary | ICD-10-CM | POA: Insufficient documentation

## 2014-12-05 DIAGNOSIS — I272 Other secondary pulmonary hypertension: Secondary | ICD-10-CM | POA: Diagnosis not present

## 2014-12-05 DIAGNOSIS — I270 Primary pulmonary hypertension: Secondary | ICD-10-CM

## 2014-12-05 DIAGNOSIS — Z8673 Personal history of transient ischemic attack (TIA), and cerebral infarction without residual deficits: Secondary | ICD-10-CM | POA: Insufficient documentation

## 2014-12-05 DIAGNOSIS — G629 Polyneuropathy, unspecified: Secondary | ICD-10-CM | POA: Insufficient documentation

## 2014-12-05 DIAGNOSIS — I2510 Atherosclerotic heart disease of native coronary artery without angina pectoris: Secondary | ICD-10-CM | POA: Insufficient documentation

## 2014-12-05 DIAGNOSIS — Z79899 Other long term (current) drug therapy: Secondary | ICD-10-CM | POA: Insufficient documentation

## 2014-12-05 DIAGNOSIS — Z951 Presence of aortocoronary bypass graft: Secondary | ICD-10-CM

## 2014-12-05 DIAGNOSIS — I482 Chronic atrial fibrillation, unspecified: Secondary | ICD-10-CM

## 2014-12-05 DIAGNOSIS — E785 Hyperlipidemia, unspecified: Secondary | ICD-10-CM | POA: Insufficient documentation

## 2014-12-05 DIAGNOSIS — I480 Paroxysmal atrial fibrillation: Secondary | ICD-10-CM | POA: Insufficient documentation

## 2014-12-05 DIAGNOSIS — G4733 Obstructive sleep apnea (adult) (pediatric): Secondary | ICD-10-CM | POA: Insufficient documentation

## 2014-12-05 LAB — BASIC METABOLIC PANEL
Anion gap: 9 (ref 5–15)
BUN: 15 mg/dL (ref 6–20)
CO2: 26 mmol/L (ref 22–32)
Calcium: 9.4 mg/dL (ref 8.9–10.3)
Chloride: 97 mmol/L — ABNORMAL LOW (ref 101–111)
Creatinine, Ser: 0.97 mg/dL (ref 0.61–1.24)
GFR calc Af Amer: >60 mL/min (ref >60)
Glucose, Bld: 106 mg/dL — ABNORMAL HIGH (ref 65–99)
Potassium: 4.3 mmol/L (ref 3.5–5.1)
SODIUM: 132 mmol/L — AB (ref 135–145)

## 2014-12-05 LAB — CBC
HCT: 43.3 % (ref 39.0–52.0)
Hemoglobin: 15.2 g/dL (ref 13.0–17.0)
MCH: 31.7 pg (ref 26.0–34.0)
MCHC: 35.1 g/dL (ref 30.0–36.0)
MCV: 90.2 fL (ref 78.0–100.0)
PLATELETS: 162 10*3/uL (ref 150–400)
RBC: 4.8 MIL/uL (ref 4.22–5.81)
RDW: 14.4 % (ref 11.5–15.5)
WBC: 5.4 10*3/uL (ref 4.0–10.5)

## 2014-12-05 LAB — TSH: TSH: 2.043 u[IU]/mL (ref 0.350–4.500)

## 2014-12-05 LAB — BRAIN NATRIURETIC PEPTIDE: B NATRIURETIC PEPTIDE 5: 492 pg/mL — AB (ref 0.0–100.0)

## 2014-12-05 MED ORDER — FUROSEMIDE 40 MG PO TABS: 40.0000 mg | ORAL_TABLET | ORAL | Status: DC

## 2014-12-05 MED ORDER — FUROSEMIDE 80 MG PO TABS: 80.0000 mg | ORAL_TABLET | Freq: Every day | ORAL | Status: DC

## 2014-12-05 NOTE — Patient Instructions (Addendum)
Take Furosemide (Lasix) 80 mg in AM and 40 mg in PM for 3 DAYS, then take  Lasix 80 mg EVERY AM AND TAKE 40 MG every other day IN THE PM  Labs today  Your physician has recommended that you wear a holter monitor. Holter monitors are medical devices that record the heart's electrical activity. Doctors most often use these monitors to diagnose arrhythmias. Arrhythmias are problems with the speed or rhythm of the heartbeat. The monitor is a small, portable device. You can wear one while you do your normal daily activities. This is usually used to diagnose what is causing palpitations/syncope (passing out).  Your physician recommends that you schedule a follow-up appointment in: 1 week

## 2014-12-06 NOTE — Progress Notes (Signed)
Patient ID: Caleb Moment, PhD, male   DOB: 03-Jan-1941, 74 y.o.   MRN: [id number] PCP: Dr. Sherren Mocha EP: [name]  74 yo with complex past history presents for heart failure evaluation.  Patient had anterior MI in 2004 and developed ischemic cardiomyopathy as well as mitral regurgitation. He also developed atrial fibrillation.  In 10/14, he had MV repair, Maze, CABG with LIMA-LAD, and LA appendage closure at [facility] in [place].  Subsequently, atrial fibrillation returned and he had an atrial fibrillation ablation in [facility] in 3/15.  He wore a Zio patch in 12/15 and had a low atrial fibrillation burden of 7%.  He has had a long-standing ischemic cardiomyopathy.  In 2015, EF was 20-25%.  Echo in 2016 also showed EF 25-30% but estimated PA pressure suggested severe pulmonary hypertension, which is new for him.    At initial appointment, he reported increased exertional dyspnea over a number of weeks.  RHC was done, showing mildly elevated PCWP with moderate pulmonary HTN and low cardiac index (1.93 thermo, 2.13 Fick).  V/Q scan showed no PE.  I started him on digoxin and have titrated his Lasix to 80 mg daily.  He is now on bisoprolol and able to tolerate it.  At last appointment, I had him try replacing valsartan with Entresto 24/26 bid.  He was unable to tolerate it (made him dizzy, though BP was not low).  Therefore, I had him restart valsartan.  CPX in 4/16 showed mildly decreased functional capacity.  He has tolerated Adcirca 20 mg daily.   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  He is more short of breath than in the past.  For a couple of nights in a row, he has felt palpitations/irregular heart rhythm.  This has kept him up.  He has been very anxious.  He is now short of breath walking to his mailbox.  No orthopnea/PND.  No chest pain.  ReDs vest reading today was 37.  Weight is actually down 3 lbs.   6 minute walk (3/16): 381 m  6 minute walk (5/16): 414.5 m  Labs (2/15): LDL  144 Labs (8/15): K 4.6, creatinine 0.9 Labs (12/15): HCT 42.3   Labs (2/16): K 4 => 4.2, creatinine 1.05 => 0.92, BNP 268 Labs (3/16): BNP 495 => 320, digoxin 0.4, RF 14.7 (very mild increase), TSH normal, HIV negative, anti-SCL70 negative, creatinine 0.91, K 4.4 Labs (7/16): K 5, creatinine 1.05, BNP 455, vitamin D normal, digoxin 0.4, B12 normal Labs (8/16): digoxin 0.8, BNP 305, K 4.2, creatinine 0.99  ECG: NSR, left axis deviation, IVCD 130 msec, lateral TWIs  PMH: 1. CAD: Anterior MI in 2004.  Cardiac surgery in 10/14 included LIMA-LAD.  2. Chronic mitral regurgitation: 10/14 surgery at [facility] with MV repair, Maze, LIMA-LAD, and LA appendage closure.  3. Atrial fibrillation: Paroxysmal.  H/o Maze in 2014.  Had recurrent atrial fibrillation with ablation in 3/15 by Dr Ola Spurr in [facility].  Not anticoagulated after 2 severe prostate bleeding episodes.  - Zio patch in 12/15 with low atrial fibrillation burden (7%).  4. Chronotropic incompetence.  5. HTN 6. Hyperlipidemia: Refuses statin.  7. Peripheral neuropathy 8. GERD 9. H/o BPPV 10. H/o TIA 11. Ischemic cardiomyopathy: cardiac MRI 7/14 with EF 35%, moderate MR, normal RV size and systolic function, extensive anterior and anteroseptal LGE suggestive of non-viable myocardium (this was prior to LIMA-LAD).  Echo 1/15 with EF 20-25%, moderate AI, PA systolic pressure 39 mmHg. Echo (1/16) with  EF 20-25%, diffuse hypokinesis with regionality, moderate LV dilation, moderate AI, s/p MV repair with mild MR and normal gradients, RV dilated with mildly decreased systolic function, PA systolic pressure 71 mmHg.  RHC (2/16) with mean RA 6, PA 61/25 mean 40, mean PCWP 23, CI 2.13/PVR 4.3 (Fick), CI 1.93/PVR 4.7 (thermo).  CPX (4/16) with peak VO2 17.9, VE/VCO2 34.7 => mildly decreased functional capacity.  - Spironolactone apparently caused cognitive deficits - Intolerant of Coreg due to development of severe alopecia 12. Carotid dopplers  (1/16) with 40-59% bilateral ICA stenosis.  13. Aortic insufficiency: Moderate by last echo 2/16.  14. Pulmonary HTN: Mixed PAH and pulmonary venous hypertension.  PFTs (9/14) were normal.  V/Q scan (2/16) with no evidence of acute or chronic PE.  6 minute walk (3/16) 381 m. 6 minute walk (5/16) 414.5 m.  15. OSA: Moderate on 5/16 sleep study.  16. 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   Current Outpatient Prescriptions  Medication Sig Dispense Refill  . Ascorbic Acid (VITAMIN C) 500 MG CAPS Take 2 capsules by mouth daily.    Marland Kitchen aspirin 81 MG chewable tablet Chew 81 mg by mouth daily.    . bisoprolol (ZEBETA) 5 MG tablet Take 0.5 tablets (2.5 mg total) by mouth daily. 15 tablet 3  . digoxin (LANOXIN) 0.125 MG tablet Take 1 tablet (0.125 mg total) by mouth daily. 90 tablet 3  . eplerenone (INSPRA) 25 MG tablet Take 1 tablet (25 mg total) by mouth daily. 30 tablet 6  . furosemide (LASIX) 40 MG tablet Take 1 tablet (40 mg total) by mouth every other day. IN THE PM 60 tablet 6  . furosemide (LASIX) 80 MG tablet Take 1 tablet (80 mg total) by mouth daily. Every AM    . Magnesium 100 MG CAPS Take 1 capsule by mouth 2 (two) times daily.     . nitroGLYCERIN (NITROSTAT) 0.4 MG SL tablet Place 0.4 mg under the tongue every 5 (five) minutes as needed. May repeat for up to 3 doses.     . Omega-3 Fatty Acids (THE VERY FINEST FISH OIL) LIQD Take 2-3 g by mouth daily.     . Tadalafil, PAH, (ADCIRCA) 20 MG TABS Take 1 tablet (20 mg total) by mouth daily. 30 tablet 6  . valsartan (DIOVAN) 40 MG tablet Take 1 tablet (40 mg total) by mouth 2 (two) times daily. 60 tablet 3  . Zinc 100 MG TABS Take 1 tablet by mouth daily.      No current facility-administered medications for this encounter.   BP 138/52 mmHg  Pulse 65  Wt 153 lb 6.4 oz (69.582 kg)  SpO2 98% General: NAD Neck: No JVD,  no thyromegaly or thyroid nodule.  Lungs: Clear to auscultation bilaterally with normal respiratory effort. CV: Nondisplaced PMI.  Heart regular S1/S2, no S3/S4, 1/6 HSM apex, 1/6 diastolic murmur along the sternal border.  1+ edema left ankle, trace edema on right.  No carotid bruit.  Normal pedal pulses.  Abdomen: Soft, nontender, no hepatosplenomegaly, no distention.  Skin: Intact without lesions or rashes.  Neurologic: Alert and oriented x 3.  Psych: Normal affect. Extremities: No clubbing or cyanosis.  HEENT: Normal.   Assessment/Plan: 1. CAD: This seems stable, no chest pain. S/p LIMA-LAD.  He is on ASA 81.  He has decided not to take statins after reviewing the data.   2.  S/p mitral valve repair: The MV repair looked stable on last echo with mild MR and no evidence for significant mitral stenosis.  3. Aortic insufficiency: Moderate on last echo, similar to prior.  Diastolic murmur on exam.  4. Chronic systolic CHF: Ischemic cardiomyopathy, EF 20-25%.  This is similar to the past.  However, estimated PA pressure was higher on 1/16 echo than in past and symptoms worsened.  RHC showed moderate pulmonary hypertension.  Mildly decreased functional capacity on 4/16 CPX.  NYHA class II-III symptoms, today, somewhat worsened.  Not markedly volume overloaded on exam but ReDs vest number was 37, suspect he is carrying some excess fluid. - Continue digoxin, last level was ok.  - He has tolerated bisoprolol, continue 2.5 mg daily.  - Continue valsartan 40 mg bid (he did not tolerate Entresto).    - Continue eplerenone.   - Today, I will increase Lasix to 80 qam/40 qpm x 3 days then Lasix 80 daily alternating every other day with 80 qam/40 qpm. BMET/BNP today and repeat in 1 week at followup.    - He does not want an ICD and understands the reasoning for having one.  - In the future, he could be an LVAD candidate.  - He is interested in stem cell therapy and has been doing research on possible  clinical trials.  5. Atrial fibrillation: s/p Maze in 10/14, then ablation in 3/15. 7% atrial fibrillation on Zio patch in 12/15.  I am concerned that his symptoms at night (palpitations keeping him awake) may be atrial fibrillation (but could also be PVCs/PACs.   - Currently, he is not anticoagulated due to history of prostate bleeding.  - If holter shows frequent atrial fibrillation runs that are symptomatic, we will need to consider use of Tikosyn to keep him in rhythm.  This would require admission x 3 days to the hospital and we would need to reconsider use of anticoagulation.  6. Pulmonary hypertension: Severe by echo (was mild on prior echo in 1/15), moderate by last RHC.  Mixed pulmonary venous and pulmonary arterial HTN.  It is possible that the [facility] component is due to pulmonary vascular remodeling in the setting of chronic mitral regurgitation prior to MV repair. Negative V/Q scan, no evidence for CTEPH.  PFTs normal in 9/14.  He is seeing Dr Lake Bells.  Recent sleep study showed moderate OSA.  He has started on Adcirca and felt like it helped, he does not want to increase it to 40 mg daily. 7. OSA: Moderate.  He has an appointment to talk to Dr Radford Pax about CPAP.   Followup with me in 2 week   Loralie Champagne 12/06/2014

## 2014-12-11 ENCOUNTER — Ambulatory Visit (HOSPITAL_COMMUNITY)
Admission: RE | Admit: 2014-12-11 | Discharge: 2014-12-11 | Disposition: A | Discharge status: Home / Self Care | Payer: Medicare Other | Source: Ambulatory Visit | Attending: Cardiology | Admitting: Cardiology

## 2014-12-11 VITALS — BP 132/50 | HR 60 | Wt 153.5 lb

## 2014-12-11 DIAGNOSIS — I38 Endocarditis, valve unspecified: Secondary | ICD-10-CM | POA: Diagnosis not present

## 2014-12-11 DIAGNOSIS — Z9889 Other specified postprocedural states: Secondary | ICD-10-CM

## 2014-12-11 DIAGNOSIS — E785 Hyperlipidemia, unspecified: Secondary | ICD-10-CM | POA: Diagnosis not present

## 2014-12-11 DIAGNOSIS — I080 Rheumatic disorders of both mitral and aortic valves: Secondary | ICD-10-CM | POA: Diagnosis not present

## 2014-12-11 DIAGNOSIS — I480 Paroxysmal atrial fibrillation: Secondary | ICD-10-CM

## 2014-12-11 DIAGNOSIS — I2510 Atherosclerotic heart disease of native coronary artery without angina pectoris: Secondary | ICD-10-CM | POA: Diagnosis not present

## 2014-12-11 DIAGNOSIS — Z951 Presence of aortocoronary bypass graft: Secondary | ICD-10-CM | POA: Diagnosis not present

## 2014-12-11 DIAGNOSIS — I6523 Occlusion and stenosis of bilateral carotid arteries: Secondary | ICD-10-CM | POA: Diagnosis not present

## 2014-12-11 DIAGNOSIS — I252 Old myocardial infarction: Secondary | ICD-10-CM | POA: Insufficient documentation

## 2014-12-11 DIAGNOSIS — G629 Polyneuropathy, unspecified: Secondary | ICD-10-CM | POA: Diagnosis not present

## 2014-12-11 DIAGNOSIS — Z8673 Personal history of transient ischemic attack (TIA), and cerebral infarction without residual deficits: Secondary | ICD-10-CM | POA: Insufficient documentation

## 2014-12-11 DIAGNOSIS — Z79899 Other long term (current) drug therapy: Secondary | ICD-10-CM | POA: Diagnosis not present

## 2014-12-11 DIAGNOSIS — I272 Other secondary pulmonary hypertension: Secondary | ICD-10-CM | POA: Diagnosis not present

## 2014-12-11 DIAGNOSIS — I340 Nonrheumatic mitral (valve) insufficiency: Secondary | ICD-10-CM | POA: Diagnosis not present

## 2014-12-11 DIAGNOSIS — I5022 Chronic systolic (congestive) heart failure: Secondary | ICD-10-CM | POA: Diagnosis not present

## 2014-12-11 DIAGNOSIS — I255 Ischemic cardiomyopathy: Secondary | ICD-10-CM | POA: Insufficient documentation

## 2014-12-11 DIAGNOSIS — G4733 Obstructive sleep apnea (adult) (pediatric): Secondary | ICD-10-CM | POA: Insufficient documentation

## 2014-12-11 DIAGNOSIS — Z8249 Family history of ischemic heart disease and other diseases of the circulatory system: Secondary | ICD-10-CM | POA: Diagnosis not present

## 2014-12-11 DIAGNOSIS — I872 Venous insufficiency (chronic) (peripheral): Secondary | ICD-10-CM | POA: Insufficient documentation

## 2014-12-11 DIAGNOSIS — K219 Gastro-esophageal reflux disease without esophagitis: Secondary | ICD-10-CM | POA: Diagnosis not present

## 2014-12-11 DIAGNOSIS — Z7982 Long term (current) use of aspirin: Secondary | ICD-10-CM | POA: Diagnosis not present

## 2014-12-11 LAB — BASIC METABOLIC PANEL
Anion gap: 9 (ref 5–15)
BUN: 15 mg/dL (ref 6–20)
CALCIUM: 9.6 mg/dL (ref 8.9–10.3)
CO2: 30 mmol/L (ref 22–32)
CREATININE: 1.04 mg/dL (ref 0.61–1.24)
Chloride: 95 mmol/L — ABNORMAL LOW (ref 101–111)
GFR calc Af Amer: >60 mL/min (ref >60)
GLUCOSE: 129 mg/dL — AB (ref 65–99)
POTASSIUM: 4.6 mmol/L (ref 3.5–5.1)
SODIUM: 134 mmol/L — AB (ref 135–145)

## 2014-12-11 LAB — DIGOXIN LEVEL: Digoxin Level: 0.6 ng/mL — ABNORMAL LOW (ref 0.8–2.0)

## 2014-12-11 LAB — BRAIN NATRIURETIC PEPTIDE: B NATRIURETIC PEPTIDE 5: 278.4 pg/mL — AB (ref 0.0–100.0)

## 2014-12-11 MED ORDER — FUROSEMIDE 40 MG PO TABS: ORAL_TABLET | ORAL | Status: DC

## 2014-12-11 MED ORDER — BISOPROLOL FUMARATE 5 MG PO TABS: 2.5000 mg | ORAL_TABLET | Freq: Two times a day (BID) | ORAL | Status: DC

## 2014-12-11 NOTE — Progress Notes (Signed)
Patient ID: Caleb Moment, PhD, male   DOB: 1940/04/05, 74 y.o.   MRN: [id number] PCP: Dr. Sherren Mocha EP: [name]  74 yo with complex past history presents for heart failure evaluation.  Patient had anterior MI in 2004 and developed ischemic cardiomyopathy as well as mitral regurgitation. He also developed atrial fibrillation.  In 10/14, he had MV repair, Maze, CABG with LIMA-LAD, and LA appendage closure at [facility] in [place].  Subsequently, atrial fibrillation returned and he had an atrial fibrillation ablation in [facility] in 3/15.  He wore a Zio patch in 12/15 and had a low atrial fibrillation burden of 7%.  He has had a long-standing ischemic cardiomyopathy.  In 2015, EF was 20-25%.  Echo in 2016 also showed EF 25-30% but estimated PA pressure suggested severe pulmonary hypertension, which is new for him.    At initial appointment, he reported increased exertional dyspnea over a number of weeks.  RHC was done, showing mildly elevated PCWP with moderate pulmonary HTN and low cardiac index (1.93 thermo, 2.13 Fick).  V/Q scan showed no PE.  I started him on digoxin and have titrated his Lasix to 80 mg daily.  He is now on bisoprolol and able to tolerate it.  At last appointment, I had him try replacing valsartan with Entresto 24/26 bid.  He was unable to tolerate it (made him dizzy, though BP was not low).  Therefore, I had him restart valsartan.  CPX in 4/16 showed mildly decreased functional capacity.  He has tolerated Adcirca 20 mg daily.   At last appointment, [name] was feeling poorly => more short of breath and noting palpitations, especially at night.  I increased his Lasix and had him wear a holter monitor.  The holter showed occasional PACs and PVCs as well as a few short, nonsustained runs of atrial fibrillation.  He continues to feel occasional palpitations.  His breathing is better; he no longer has significant dyspnea with exertion.  No chest pain.  No orthopnea/PND.  ReDs vest reading  = 33.    6 minute walk (3/16): 381 m  6 minute walk (5/16): 414.5 m  Labs (2/15): LDL 144 Labs (8/15): K 4.6, creatinine 0.9 Labs (12/15): HCT 42.3   Labs (2/16): K 4 => 4.2, creatinine 1.05 => 0.92, BNP 268 Labs (3/16): BNP 495 => 320, digoxin 0.4, RF 14.7 (very mild increase), TSH normal, HIV negative, anti-SCL70 negative, creatinine 0.91, K 4.4 Labs (7/16): K 5, creatinine 1.05, BNP 455, vitamin D normal, digoxin 0.4, B12 normal Labs (8/16): digoxin 0.8, BNP 305, K 4.2, creatinine 0.99 Labs (9/16): HCT 43.3, TSH normal, BNP 492, K 4.3, creatinine 0.97  PMH: 1. CAD: Anterior MI in 2004.  Cardiac surgery in 10/14 included LIMA-LAD.  2. Chronic mitral regurgitation: 10/14 surgery at [facility] with MV repair, Maze, LIMA-LAD, and LA appendage closure.  3. Atrial fibrillation: Paroxysmal.  H/o Maze in 2014.  Had recurrent atrial fibrillation with ablation in 3/15 by Dr Ola Spurr in [facility].  Not anticoagulated after 2 severe prostate bleeding episodes.  - Zio patch in 12/15 with low atrial fibrillation burden (7%).  - Holter (9/16) with PACs, PVCs, short atrial fibrillation runs (nothing sustained). 4. Chronotropic incompetence.  5. HTN 6. Hyperlipidemia: Refuses statin.  7. Peripheral neuropathy 8. GERD 9. H/o BPPV 10. H/o TIA 11. Ischemic cardiomyopathy: cardiac MRI 7/14 with EF 35%, moderate MR, normal RV size and systolic function, extensive anterior and anteroseptal LGE suggestive of non-viable myocardium (this was prior  to LIMA-LAD).  Echo 1/15 with EF 20-25%, moderate AI, PA systolic pressure 39 mmHg. Echo (1/16) with EF 20-25%, diffuse hypokinesis with regionality, moderate LV dilation, moderate AI, s/p MV repair with mild MR and normal gradients, RV dilated with mildly decreased systolic function, PA systolic pressure 71 mmHg.  RHC (2/16) with mean RA 6, PA 61/25 mean 40, mean PCWP 23, CI 2.13/PVR 4.3 (Fick), CI 1.93/PVR 4.7 (thermo).  CPX (4/16) with peak VO2 17.9, VE/VCO2  34.7 => mildly decreased functional capacity.  - Spironolactone apparently caused cognitive deficits - Intolerant of Coreg due to development of severe alopecia 12. Carotid dopplers (1/16) with 40-59% bilateral ICA stenosis.  13. Aortic insufficiency: Moderate by last echo 2/16.  14. Pulmonary HTN: Mixed PAH and pulmonary venous hypertension.  PFTs (9/14) were normal.  V/Q scan (2/16) with no evidence of acute or chronic PE.  6 minute walk (3/16) 381 m. 6 minute walk (5/16) 414.5 m.  15. OSA: Moderate on 5/16 sleep study.  16. Venous insufficiency  SH: Married, lives in [place], Careers information officer, nonsmoker  FH: CAD  ROS: All systems reviewed and negative except as per HPI.   Current Outpatient Prescriptions  Medication Sig Dispense Refill  . Ascorbic Acid (VITAMIN C) 500 MG CAPS Take 2 capsules by mouth daily.    Marland Kitchen aspirin 81 MG chewable tablet Chew 81 mg by mouth daily.    . bisoprolol (ZEBETA) 5 MG tablet Take 0.5 tablets (2.5 mg total) by mouth 2 (two) times daily. 30 tablet 3  . digoxin (LANOXIN) 0.125 MG tablet Take 1 tablet (0.125 mg total) by mouth daily. 90 tablet 3  . eplerenone (INSPRA) 25 MG tablet Take 1 tablet (25 mg total) by mouth daily. 30 tablet 6  . furosemide (LASIX) 40 MG tablet Take 2 tabs EVERY AM and 1 tab every other PM 90 tablet 6  . Magnesium 100 MG CAPS Take 1 capsule by mouth 2 (two) times daily.     . nitroGLYCERIN (NITROSTAT) 0.4 MG SL tablet Place 0.4 mg under the tongue every 5 (five) minutes as needed. May repeat for up to 3 doses.     . Omega-3 Fatty Acids (THE VERY FINEST FISH OIL) LIQD Take 2-3 g by mouth daily.     . Tadalafil, PAH, (ADCIRCA) 20 MG TABS Take 1 tablet (20 mg total) by mouth daily. 30 tablet 6  . valsartan (DIOVAN) 40 MG tablet Take 1 tablet (40 mg total) by mouth 2 (two) times daily. 60 tablet 3  . Zinc 100 MG TABS Take 1 tablet by mouth daily.      No current facility-administered medications for this encounter.   BP  132/50 mmHg  Pulse 60  Wt 153 lb 8 oz (69.627 kg)  SpO2 98% General: NAD Neck: No JVD, no thyromegaly or thyroid nodule.  Lungs: Clear to auscultation bilaterally with normal respiratory effort. CV: Nondisplaced PMI.  Heart regular S1/S2, no S3/S4, 1/6 HSM apex, 1/6 diastolic murmur along the sternal border.  1+ edema left ankle, trace edema on right.  No carotid bruit.  Normal pedal pulses.  Abdomen: Soft, nontender, no hepatosplenomegaly, no distention.  Skin: Intact without lesions or rashes.  Neurologic: Alert and oriented x 3.  Psych: Normal affect. Extremities: No clubbing or cyanosis.  HEENT: Normal.   Assessment/Plan: 1. CAD: This seems stable, no chest pain. S/p LIMA-LAD.  He is on ASA 81.  He has decided not to take statins after reviewing the data.   2. S/p mitral  valve repair: The MV repair looked stable on last echo with mild MR and no evidence for significant mitral stenosis.  3. Aortic insufficiency: Moderate on last echo, similar to prior.  Diastolic murmur on exam.  4. Chronic systolic CHF: Ischemic cardiomyopathy, EF 20-25%.  This is similar to the past.  However, estimated PA pressure was higher on 1/16 echo than in past and symptoms worsened.  RHC done after this showed moderate pulmonary hypertension.  Mildly decreased functional capacity on 4/16 CPX.  NYHA class II symptoms, improved with increased Lasix.  ReDs vest reading is lower.  - Continue digoxin, check level today.   - He has tolerated bisoprolol, increase to 2.5 mg bid.  - Continue valsartan 40 mg bid (he did not tolerate Entresto).    - Continue eplerenone.   - Continue Lasix 80 mg daily alternating with 80 qam/40 qpm.  BMET/BNP today.     - He does not want an ICD and understands the reasoning for having one.  - In the future, he could be an LVAD candidate.  - He is interested in stem cell therapy and has been doing research on possible clinical trials.  5. Atrial fibrillation: s/p Maze in 10/14, then  ablation in 3/15. 7% atrial fibrillation on Zio patch in 12/15.  His holter monitor showed occasional PVCs and PACs and a couple of short runs that appeared to be atrial fibrillation (nothing sustained).  His palpitations may actually be due to the PACs and PVCs.    - Currently, he is not anticoagulated due to history of prostate bleeding.  - Increase bisoprolol to 2.5 mg bid as above.  6. Pulmonary hypertension: Severe by echo (was mild on prior echo in 1/15), moderate by last RHC.  Mixed pulmonary venous and pulmonary arterial HTN.  It is possible that the [facility] component is due to pulmonary vascular remodeling in the setting of chronic mitral regurgitation prior to MV repair. Negative V/Q scan, no evidence for CTEPH.  PFTs normal in 9/14.  He is seeing Dr Lake Bells.  Sleep study showed moderate OSA.  He has started on Adcirca and felt like it helped, he does not want to increase it to 40 mg daily. 7. OSA: Moderate.  He has an appointment to talk to Dr Radford Pax about CPAP.   Followup with me in 1 month.    Loralie Champagne 12/11/2014

## 2014-12-11 NOTE — Patient Instructions (Signed)
Increase Bisoprolol to 2.5 mg Twice daily   Labs today  Your physician recommends that you schedule a follow-up appointment in: 1 month

## 2014-12-12 ENCOUNTER — Telehealth (HOSPITAL_COMMUNITY): Payer: Self-pay | Admitting: *Deleted

## 2014-12-12 NOTE — Telephone Encounter (Signed)
We can recheck it when he comes back into the office for his followup.

## 2014-12-12 NOTE — Telephone Encounter (Signed)
[name] called and said that he had his Homocysteine drawn results were 19. Which he says is high. He wants to make you aware and ask if he needs to have it redrawn locally.  He is very concerned about this test.  Please advise!

## 2014-12-20 MED ORDER — BISOPROLOL FUMARATE 5 MG PO TABS: 2.5000 mg | ORAL_TABLET | Freq: Every day | ORAL | Status: DC

## 2014-12-20 NOTE — Telephone Encounter (Signed)
Pt aware and agreeable, he also states he had to decrease his Bisoprolol back to 2.5 mg daily due to side effects and he is feeling much better back on once a day, will let Dr Aundra Dubin know

## 2014-12-27 ENCOUNTER — Telehealth: Payer: Self-pay | Admitting: Cardiology

## 2014-12-27 NOTE — Telephone Encounter (Signed)
Left message for the patient that he can talk to Dr. Radford Pax about it at his appointment 10/13 and get the shot here, if he'd like.  Instructed him to call back if he has any questions.

## 2014-12-27 NOTE — Telephone Encounter (Signed)
New Message  Shana with [facility] called for the pt states that the pt request a call to back to determine if he is able to receive a flu shot. She says that you can call the pt back or simply notate it in the chart.

## 2015-01-03 ENCOUNTER — Ambulatory Visit (INDEPENDENT_AMBULATORY_CARE_PROVIDER_SITE_OTHER): Payer: Medicare Other | Admitting: Cardiology

## 2015-01-03 ENCOUNTER — Encounter: Payer: Self-pay | Admitting: Cardiology

## 2015-01-03 VITALS — BP 130/52 | HR 52 | Ht 70.0 in | Wt 155.9 lb

## 2015-01-03 DIAGNOSIS — I272 Other secondary pulmonary hypertension: Secondary | ICD-10-CM

## 2015-01-03 DIAGNOSIS — I6523 Occlusion and stenosis of bilateral carotid arteries: Secondary | ICD-10-CM | POA: Diagnosis not present

## 2015-01-03 DIAGNOSIS — G4733 Obstructive sleep apnea (adult) (pediatric): Secondary | ICD-10-CM | POA: Diagnosis not present

## 2015-01-03 DIAGNOSIS — I10 Essential (primary) hypertension: Secondary | ICD-10-CM | POA: Diagnosis not present

## 2015-01-03 NOTE — Patient Instructions (Addendum)
Medication Instructions:  Your physician recommends that you continue on your current medications as directed. Please refer to the Current Medication list given to you today.   Labwork: None  Testing/Procedures: Dr. Radford Pax recommends you have a CPAP titration.  Follow-Up: We will give you a call after your CPAP titration with the results and appointment information.  Any Other Special Instructions Will Be Listed Below (If Applicable).

## 2015-01-03 NOTE — Progress Notes (Signed)
Cardiology Office Note   Date:  01/03/2015   ID:  Caleb Moment, Caleb [name], DOB 03/10/1941, MRN [id number]  PCP:  Joycelyn Man, MD    Chief Complaint  Patient presents with  . Sleep Apnea      History of Present Illness: Caleb Moment, Caleb [name] is a 73 y.o. male who presents for evaluation of OSA.  He was referred by [name] for sleep study due to snoring, excessive daytime sleepiness and pulmonary HTN as well as PAF.  PSG showed moderate OSA with an AHI of 18.5/hr with most events occurring in the nonsupine position.  He had oxygen desaturations as low as 86%.  CPAP therapy was recommended but patient wanted to discuss results and CPAP therapy prior to proceeding further.      Past Medical History  Diagnosis Date  . CAD (coronary artery disease)   . Atrial fibrillation ([place])   . Cardiomyopathy, ischemic   . Heart failure, systolic, acute on chronic ([place])   . Hypertension   . Hyperlipidemia   . Pleural effusion   . Positional vertigo   . Dizziness   . GERD (gastroesophageal reflux disease)   . Atypical pneumonia   . Cough   . Allergic rhinitis   . TIA (transient ischemic attack)   . OSA (obstructive sleep apnea)     Home sleep test 07/05/2009 AHI 8.2  . Chronic anticoagulation     Past Surgical History  Procedure Laterality Date  . Coronary stent placement  2004    LAD  . Shoulder surgery    . Tee without cardioversion N/A 09/21/2012    Procedure: TRANSESOPHAGEAL ECHOCARDIOGRAM (TEE);  Surgeon: Larey Dresser, MD;  Location: [facility] ENDOSCOPY;  Service: Cardiovascular;  Laterality: N/A;  . Coronary artery bypass graft  01/09/13    LAD LIMA, left atrial appendage  . Mitral valve annuloplasty  01/09/13  . Aortic valve repair  01/09/13  . Right heart catheterization N/A 05/11/2014    Procedure: RIGHT HEART CATH;  Surgeon: Larey Dresser, MD;  Location: [facility] CATH LAB;  Service: Cardiovascular;  Laterality: N/A;     Current Outpatient Prescriptions    Medication Sig Dispense Refill  . Ascorbic Acid (VITAMIN C) 500 MG CAPS Take 2 capsules by mouth daily.    Marland Kitchen aspirin 81 MG chewable tablet Chew 81 mg by mouth daily.    . bisoprolol (ZEBETA) 5 MG tablet Take 0.5 tablets (2.5 mg total) by mouth daily. 30 tablet 3  . digoxin (LANOXIN) 0.125 MG tablet Take 1 tablet (0.125 mg total) by mouth daily. 90 tablet 3  . eplerenone (INSPRA) 25 MG tablet Take 1 tablet (25 mg total) by mouth daily. 30 tablet 6  . furosemide (LASIX) 40 MG tablet Take 2 tabs EVERY AM and 1 tab every other PM 90 tablet 6  . Magnesium 100 MG CAPS Take 1 capsule by mouth 2 (two) times daily.     . nitroGLYCERIN (NITROSTAT) 0.4 MG SL tablet Place 0.4 mg under the tongue every 5 (five) minutes as needed. May repeat for up to 3 doses.     . Omega-3 Fatty Acids (THE VERY FINEST FISH OIL) LIQD Take 2-3 g by mouth daily.     . Tadalafil, PAH, (ADCIRCA) 20 MG TABS Take 1 tablet (20 mg total) by mouth daily. 30 tablet 6  . valsartan (DIOVAN) 40 MG tablet Take 1 tablet (40 mg total)  by mouth 2 (two) times daily. 60 tablet 3  . Zinc 100 MG TABS Take 1 tablet by mouth daily.      No current facility-administered medications for this visit.    Allergies:   Carvedilol; Pradaxa; Sulfonamide derivatives; and Xarelto    Social History:  The patient  reports that he has never smoked. He has never used smokeless tobacco. He reports that he does not drink alcohol or use illicit drugs.   Family History:  The patient's family history includes Diabetes in an other family member. There is no history of Heart disease or Hypertension. He was adopted.    ROS:  Please see the history of present illness.   Otherwise, review of systems are positive for none.   All other systems are reviewed and negative.    PHYSICAL EXAM: VS:  BP 130/52 mmHg  Pulse 52  Ht _0  (1.778 m)  Wt 155 lb 14.4 oz (70.716 kg)  BMI 22.37 kg/m2  SpO2 98% , BMI Body mass index is 22.37 kg/(m^2). GEN: Well nourished,  well developed, in no acute distress HEENT: normal Neck: no JVD, carotid bruits, or masses Cardiac: RRR; no murmurs, rubs, or gallops,no edema  Respiratory:  clear to auscultation bilaterally, normal work of breathing GI: soft, nontender, nondistended, + BS MS: no deformity or atrophy Skin: warm and dry, no rash Neuro:  Strength and sensation are intact Psych: euthymic mood, full affect   EKG:  EKG is not ordered today.    Recent Labs: 05/07/2014: Pro B Natriuretic peptide (BNP) 268.0* 05/24/2014: Magnesium 2.2 12/05/2014: Hemoglobin 15.2; Platelets 162; TSH 2.043 12/11/2014: B Natriuretic Peptide 278.4*; BUN 15; Creatinine, Ser 1.04; Potassium 4.6; Sodium 134*    Lipid Panel    Component Value Date/Time   CHOL 225* 05/11/2013 1003   TRIG 39.0 05/11/2013 1003   HDL 72.60 05/11/2013 1003   CHOLHDL 3 05/11/2013 1003   VLDL 7.8 05/11/2013 1003   LDLCALC 122* 04/19/2008 0927   LDLDIRECT 143.9 05/11/2013 1003      Wt Readings from Last 3 Encounters:  01/03/15 155 lb 14.4 oz (70.716 kg)  12/11/14 153 lb 8 oz (69.627 kg)  12/05/14 153 lb 6.4 oz (69.582 kg)        ASSESSMENT AND PLAN:  1.  Moderate OSA with an AHI of 18/hr and oxygen desaturations as low as 86%.  I have discussed the results of the sleep study with him.  In light of his significant pulmonary HTN, PAF and ischemic DCM I have recommended proceeding with CPAP titration.   2.  Severe pulmonary HTN most likely multifactorial from Pulmonary venous HTN, Pulmonary arterial HTN and OSA with chronic hypoxia.  This is followed by [name] and Dr. Lake Bells.   3.  HTN - controlled on BB and ARB therapy.     Current medicines are reviewed at length with the patient today.  The patient does not have concerns regarding medicines.  The following changes have been made:  no change  Labs/ tests ordered today: See above Assessment and Plan No orders of the defined types were placed in this encounter.     Disposition:    FU with me after CPAP titration   Signed, Sueanne Margarita, MD  01/03/2015 1:41 PM    [facility] [address], [place], [place]  [address] Phone: [phone]; Fax: [phone]

## 2015-01-16 ENCOUNTER — Encounter (HOSPITAL_COMMUNITY): Payer: Self-pay

## 2015-01-16 ENCOUNTER — Ambulatory Visit (HOSPITAL_COMMUNITY)
Admission: RE | Admit: 2015-01-16 | Discharge: 2015-01-16 | Disposition: A | Discharge status: Home / Self Care | Payer: Medicare Other | Source: Ambulatory Visit | Attending: Cardiology | Admitting: Cardiology

## 2015-01-16 VITALS — BP 138/52 | HR 56 | Wt 155.8 lb

## 2015-01-16 DIAGNOSIS — I872 Venous insufficiency (chronic) (peripheral): Secondary | ICD-10-CM | POA: Diagnosis not present

## 2015-01-16 DIAGNOSIS — K219 Gastro-esophageal reflux disease without esophagitis: Secondary | ICD-10-CM | POA: Insufficient documentation

## 2015-01-16 DIAGNOSIS — I351 Nonrheumatic aortic (valve) insufficiency: Secondary | ICD-10-CM | POA: Insufficient documentation

## 2015-01-16 DIAGNOSIS — I480 Paroxysmal atrial fibrillation: Secondary | ICD-10-CM | POA: Diagnosis not present

## 2015-01-16 DIAGNOSIS — I255 Ischemic cardiomyopathy: Secondary | ICD-10-CM | POA: Insufficient documentation

## 2015-01-16 DIAGNOSIS — Z9889 Other specified postprocedural states: Secondary | ICD-10-CM

## 2015-01-16 DIAGNOSIS — Z7982 Long term (current) use of aspirin: Secondary | ICD-10-CM | POA: Diagnosis not present

## 2015-01-16 DIAGNOSIS — I110 Hypertensive heart disease with heart failure: Secondary | ICD-10-CM | POA: Insufficient documentation

## 2015-01-16 DIAGNOSIS — G629 Polyneuropathy, unspecified: Secondary | ICD-10-CM | POA: Diagnosis not present

## 2015-01-16 DIAGNOSIS — Z951 Presence of aortocoronary bypass graft: Secondary | ICD-10-CM | POA: Diagnosis not present

## 2015-01-16 DIAGNOSIS — I252 Old myocardial infarction: Secondary | ICD-10-CM | POA: Insufficient documentation

## 2015-01-16 DIAGNOSIS — Z79899 Other long term (current) drug therapy: Secondary | ICD-10-CM | POA: Insufficient documentation

## 2015-01-16 DIAGNOSIS — H353190 Nonexudative age-related macular degeneration, unspecified eye, stage unspecified: Secondary | ICD-10-CM | POA: Diagnosis not present

## 2015-01-16 DIAGNOSIS — I272 Other secondary pulmonary hypertension: Secondary | ICD-10-CM | POA: Diagnosis not present

## 2015-01-16 DIAGNOSIS — E785 Hyperlipidemia, unspecified: Secondary | ICD-10-CM | POA: Insufficient documentation

## 2015-01-16 DIAGNOSIS — I5022 Chronic systolic (congestive) heart failure: Secondary | ICD-10-CM | POA: Diagnosis not present

## 2015-01-16 DIAGNOSIS — Z8673 Personal history of transient ischemic attack (TIA), and cerebral infarction without residual deficits: Secondary | ICD-10-CM | POA: Insufficient documentation

## 2015-01-16 DIAGNOSIS — I2510 Atherosclerotic heart disease of native coronary artery without angina pectoris: Secondary | ICD-10-CM | POA: Insufficient documentation

## 2015-01-16 DIAGNOSIS — G4733 Obstructive sleep apnea (adult) (pediatric): Secondary | ICD-10-CM | POA: Diagnosis not present

## 2015-01-16 DIAGNOSIS — Z8249 Family history of ischemic heart disease and other diseases of the circulatory system: Secondary | ICD-10-CM | POA: Diagnosis not present

## 2015-01-16 LAB — BASIC METABOLIC PANEL
ANION GAP: 12 (ref 5–15)
BUN: 20 mg/dL (ref 6–20)
CHLORIDE: 100 mmol/L — AB (ref 101–111)
CO2: 25 mmol/L (ref 22–32)
Calcium: 9.5 mg/dL (ref 8.9–10.3)
Creatinine, Ser: 1.1 mg/dL (ref 0.61–1.24)
GFR calc Af Amer: >60 mL/min (ref >60)
GFR calc non Af Amer: >60 mL/min (ref >60)
GLUCOSE: 106 mg/dL — AB (ref 65–99)
POTASSIUM: 4.7 mmol/L (ref 3.5–5.1)
Sodium: 137 mmol/L (ref 135–145)

## 2015-01-16 NOTE — Patient Instructions (Signed)
Labs today.  Your physician recommends that you schedule a follow-up appointment in: 3 months  Do the following things EVERYDAY: 1) Weigh yourself in the morning before breakfast. Write it down and keep it in a log. 2) Take your medicines as prescribed 3) Eat low salt foods-Limit salt (sodium) to 2000 mg per day.  4) Stay as active as you can everyday 5) Limit all fluids for the day to less than 2 liters 6)   

## 2015-01-16 NOTE — Progress Notes (Signed)
6MW completed with patient 1845 ft (562.110m, pt ambulated without difficulty HR ranged 55-79 o2 saturations ranged 93-99%

## 2015-01-16 NOTE — Progress Notes (Signed)
Patient ID: Caleb Moment, PhD, male   DOB: 14-Feb-1941, 74 y.o.   MRN: [id number] PCP: Dr. Sherren Mocha EP: [name]  74 yo with complex past history presents for heart failure evaluation.  Patient had anterior MI in 2004 and developed ischemic cardiomyopathy as well as mitral regurgitation. He also developed atrial fibrillation.  In 10/14, he had MV repair, Maze, CABG with LIMA-LAD, and LA appendage closure at [facility] in [place].  Subsequently, atrial fibrillation returned and he had an atrial fibrillation ablation in [facility] in 3/15.  He wore a Zio patch in 12/15 and had a low atrial fibrillation burden of 7%.  He has had a long-standing ischemic cardiomyopathy.  In 2015, EF was 20-25%.  Echo in 2016 also showed EF 25-30% but estimated PA pressure suggested severe pulmonary hypertension, which is new for him.    At initial appointment, he reported increased exertional dyspnea over a number of weeks.  RHC was done, showing mildly elevated PCWP with moderate pulmonary HTN and low cardiac index (1.93 thermo, 2.13 Fick).  V/Q scan showed no PE.  I started him on digoxin and have titrated his Lasix to 80 mg daily.  He is now on bisoprolol and able to tolerate it.  At last appointment, I had him try replacing valsartan with Entresto 24/26 bid.  He was unable to tolerate it (made him dizzy, though BP was not low).  Therefore, I had him restart valsartan.  CPX in 4/16 showed mildly decreased functional capacity.  He has tolerated Adcirca 20 mg daily.   [name] appears to be doing ok today.  He did not tolerate an attempt to increase bisoprolol after last appointment.  Weight is stable.  Overall thinks his breathing is slightly better.  Generally not short of breath walking on flat ground.  Can go up a flight of steps without problems.   No chest pain.  No lightheadedness.  No orthopnea/PND.  Rare palpitations.   6 minute walk (3/16): 381 m  6 minute walk (5/16): 414.5 m 6 minute walk (10/16): 562  m  Labs (2/15): LDL 144 Labs (8/15): K 4.6, creatinine 0.9 Labs (12/15): HCT 42.3   Labs (2/16): K 4 => 4.2, creatinine 1.05 => 0.92, BNP 268 Labs (3/16): BNP 495 => 320, digoxin 0.4, RF 14.7 (very mild increase), TSH normal, HIV negative, anti-SCL70 negative, creatinine 0.91, K 4.4 Labs (7/16): K 5, creatinine 1.05, BNP 455, vitamin D normal, digoxin 0.4, B12 normal Labs (8/16): digoxin 0.8, BNP 305, K 4.2, creatinine 0.99 Labs (9/16): HCT 43.3, TSH normal, BNP 492 => 278, K 4.3, creatinine 0.97 => 1.04, digoxin 0.6, TSH normal, LDL 154, LDL-P 1654.   PMH: 1. CAD: Anterior MI in 2004.  Cardiac surgery in 10/14 included LIMA-LAD.  2. Chronic mitral regurgitation: 10/14 surgery at [facility] with MV repair, Maze, LIMA-LAD, and LA appendage closure.  3. Atrial fibrillation: Paroxysmal.  H/o Maze in 2014.  Had recurrent atrial fibrillation with ablation in 3/15 by Dr Ola Spurr in [facility].  Not anticoagulated after 2 severe prostate bleeding episodes.  - Zio patch in 12/15 with low atrial fibrillation burden (7%).  - Holter (9/16) with PACs, PVCs, short atrial fibrillation runs (nothing sustained). 4. Chronotropic incompetence.  5. HTN 6. Hyperlipidemia: Refuses statin.  7. Peripheral neuropathy 8. GERD 9. H/o BPPV 10. H/o TIA 11. Ischemic cardiomyopathy: cardiac MRI 7/14 with EF 35%, moderate MR, normal RV size and systolic function, extensive anterior and anteroseptal LGE suggestive of non-viable myocardium (this  was prior to LIMA-LAD).  Echo 1/15 with EF 20-25%, moderate AI, PA systolic pressure 39 mmHg. Echo (1/16) with EF 20-25%, diffuse hypokinesis with regionality, moderate LV dilation, moderate AI, s/p MV repair with mild MR and normal gradients, RV dilated with mildly decreased systolic function, PA systolic pressure 71 mmHg.  RHC (2/16) with mean RA 6, PA 61/25 mean 40, mean PCWP 23, CI 2.13/PVR 4.3 (Fick), CI 1.93/PVR 4.7 (thermo).  CPX (4/16) with peak VO2 17.9, VE/VCO2 34.7 =>  mildly decreased functional capacity.  - Spironolactone apparently caused cognitive deficits - Intolerant of Coreg due to development of severe alopecia 12. Carotid dopplers (1/16) with 40-59% bilateral ICA stenosis.  13. Aortic insufficiency: Moderate by last echo 2/16.  14. Pulmonary HTN: Mixed PAH and pulmonary venous hypertension.  PFTs (9/14) were normal.  V/Q scan (2/16) with no evidence of acute or chronic PE.  6 minute walk (3/16) 381 m. 6 minute walk (5/16) 414.5 m.  15. OSA: Moderate on 5/16 sleep study.  16. Venous insufficiency  SH: Married, lives in [place], Careers information officer, nonsmoker  FH: CAD  ROS: All systems reviewed and negative except as per HPI.   Current Outpatient Prescriptions  Medication Sig Dispense Refill  . Ascorbic Acid (VITAMIN C) 500 MG CAPS Take 2 capsules by mouth daily.    Marland Kitchen aspirin 81 MG chewable tablet Chew 81 mg by mouth daily.    . bisoprolol (ZEBETA) 5 MG tablet Take 0.5 tablets (2.5 mg total) by mouth daily. 30 tablet 3  . digoxin (LANOXIN) 0.125 MG tablet Take 1 tablet (0.125 mg total) by mouth daily. 90 tablet 3  . eplerenone (INSPRA) 25 MG tablet Take 1 tablet (25 mg total) by mouth daily. 30 tablet 6  . furosemide (LASIX) 40 MG tablet Take 2 tabs EVERY AM and 1 tab every other PM 90 tablet 6  . Magnesium 100 MG CAPS Take 1 capsule by mouth 2 (two) times daily.     . nitroGLYCERIN (NITROSTAT) 0.4 MG SL tablet Place 0.4 mg under the tongue every 5 (five) minutes as needed. May repeat for up to 3 doses.     . Omega-3 Fatty Acids (THE VERY FINEST FISH OIL) LIQD Take 2-3 g by mouth daily.     . Tadalafil, PAH, (ADCIRCA) 20 MG TABS Take 1 tablet (20 mg total) by mouth daily. 30 tablet 6  . valsartan (DIOVAN) 40 MG tablet Take 1 tablet (40 mg total) by mouth 2 (two) times daily. 60 tablet 3  . Zinc 100 MG TABS Take 1 tablet by mouth daily.      No current facility-administered medications for this encounter.   BP 138/52 mmHg  Pulse 56   Wt 155 lb 12.8 oz (70.67 kg)  SpO2 99% General: NAD Neck: No JVD, no thyromegaly or thyroid nodule.  Lungs: Clear to auscultation bilaterally with normal respiratory effort. CV: Nondisplaced PMI.  Heart regular S1/S2, no S3/S4, 1/6 HSM apex, 1/6 diastolic murmur along the sternal border.  1+ edema left ankle, trace edema on right.  No carotid bruit.  Normal pedal pulses.  Abdomen: Soft, nontender, no hepatosplenomegaly, no distention.  Skin: Intact without lesions or rashes.  Neurologic: Alert and oriented x 3.  Psych: Normal affect. Extremities: No clubbing or cyanosis.  HEENT: Normal.   Assessment/Plan: 1. CAD: This seems stable, no chest pain. S/p LIMA-LAD.  He is on ASA 81.  He has decided not to take statins after reviewing the data.  He is going to start  red yeast rice extract.  I told him that this would be reasonable though I'd rather have him on a statin.  2. S/p mitral valve repair: The MV repair looked stable on last echo with mild MR and no evidence for significant mitral stenosis.  3. Aortic insufficiency: Moderate on last echo, similar to prior.  Diastolic murmur on exam.  4. Chronic systolic CHF: Ischemic cardiomyopathy, EF 20-25%.  This is similar to the past.  However, estimated PA pressure was higher on 1/16 echo than in past and symptoms worsened.  RHC done after this showed moderate pulmonary hypertension.  Mildly decreased functional capacity on 4/16 CPX.  NYHA class II symptoms, improved with increased Lasix.  He does not look volume overloaded on exam today.  Excellent 6 minute walk distance.   - Continue digoxin - Continue bisoprolol 2.5 mg daily (unable to increase to 2.5 bid).   - Continue valsartan 40 mg bid (he did not tolerate increasing valsartan or taking Entresto).    - Continue eplerenone.   - Continue Lasix 80 mg daily alternating with 80 qam/40 qpm.  BMET today.     - He does not want an ICD and understands the reasoning for having one.  - In the future, he  could be an LVAD candidate.  - He is interested in stem cell therapy and has been doing research on possible clinical trials.  5. Atrial fibrillation: s/p Maze in 10/14, then ablation in 3/15. 7% atrial fibrillation on Zio patch in 12/15.  His most recent holter monitor showed occasional PVCs and PACs and a couple of short runs that appeared to be atrial fibrillation (nothing sustained).  His palpitations may actually be due to the PACs and PVCs.    - Currently, he is not anticoagulated due to history of prostate bleeding.  - Continue bisoprolol.  6. Pulmonary hypertension: Severe by echo (was mild on prior echo in 1/15), moderate by last RHC.  Mixed pulmonary venous and pulmonary arterial HTN.  It is possible that the [facility] component is due to pulmonary vascular remodeling in the setting of chronic mitral regurgitation prior to MV repair. Negative V/Q scan, no evidence for CTEPH.  PFTs normal in 9/14.  He is seeing Dr Lake Bells.  Sleep study showed moderate OSA.  He has started on Adcirca and felt like it helped, he does not want to increase it to 40 mg daily.  Great 6 minute walk today.  7. OSA: Moderate.  He will be having a sleep study for CPAP titration in 12/16.    Loralie Champagne 01/16/2015

## 2015-01-17 LAB — HOMOCYSTEINE: HOMOCYSTEINE-NORM: 13 umol/L (ref 0.0–15.0)

## 2015-01-24 ENCOUNTER — Telehealth (HOSPITAL_COMMUNITY): Payer: Self-pay

## 2015-01-24 NOTE — Telephone Encounter (Signed)
Patient called and given as he requested his latest Homocystine levels and BMET

## 2015-02-07 ENCOUNTER — Ambulatory Visit (HOSPITAL_BASED_OUTPATIENT_CLINIC_OR_DEPARTMENT_OTHER): Payer: Medicare Other | Attending: Cardiology

## 2015-02-07 DIAGNOSIS — G4733 Obstructive sleep apnea (adult) (pediatric): Secondary | ICD-10-CM | POA: Diagnosis not present

## 2015-02-07 DIAGNOSIS — I493 Ventricular premature depolarization: Secondary | ICD-10-CM | POA: Diagnosis not present

## 2015-02-07 DIAGNOSIS — Z79899 Other long term (current) drug therapy: Secondary | ICD-10-CM | POA: Diagnosis not present

## 2015-02-07 DIAGNOSIS — G4730 Sleep apnea, unspecified: Secondary | ICD-10-CM | POA: Diagnosis present

## 2015-02-17 ENCOUNTER — Telehealth: Payer: Self-pay | Admitting: Cardiology

## 2015-02-17 NOTE — Telephone Encounter (Signed)
Please let patient know that he did not have adequate CPAP titration so will do home autotitration from 5 to 18cm H2O for 2 weeks.  Please set up OV with me in 10 weeks

## 2015-02-17 NOTE — Sleep Study (Signed)
   Patient Name: Caleb [name], Caleb [name] MRN: [id number] Study Date: 02/07/2015 Gender: Male D.O.B: 22-Jun-1940 Age (years): 51 Referring Provider: Fransico Him MD, ABSM Interpreting Physician: Fransico Him MD, ABSM RPSGT: Baxter Flattery  Height (inches): 70 Weight (lbs): 154 BMI: 22 Neck Size: 15.00  CLINICAL INFORMATION The patient is referred for a CPAP titration to treat sleep apnea. Date of NPSG, Split Night or HST:07/24/2014  SLEEP STUDY TECHNIQUE As per the AASM Manual for the Scoring of Sleep and Associated Events v2.3 (April 2016) with a hypopnea requiring 4% desaturations. The channels recorded and monitored were frontal, central and occipital EEG, electrooculogram (EOG), submentalis EMG (chin), nasal and oral airflow, thoracic and abdominal wall motion, anterior tibialis EMG, snore microphone, electrocardiogram, and pulse oximetry. Continuous positive airway pressure (CPAP) was initiated at the beginning of the study and titrated to treat sleep-disordered breathing.  MEDICATIONS Medications taken by the patient : ASA, Zebat, Digoxin, Inspra, magnesium, Lasix, Tadalafil, Valsartan, Zinc Medications administered by patient during sleep study : No sleep medicine administered.  TECHNICIAN COMMENTS Comments added by technician: Patient talked in his/her sleep. Patient had difficulty initiating sleep.  Comments added by scorer: N/A  RESPIRATORY PARAMETERS Optimal PAP Pressure (cm): N/A  AHI at Optimal Pressure (/hr):N/A Overall Minimal O2 (%):89.00   Supine % at Optimal N/A   Minimal O2 at Optimal Pressure (%):N/A      SLEEP ARCHITECTURE The study was initiated at 10:35:57 PM and ended at 5:04:21 AM. Sleep onset time was 14.9 minutes and the sleep efficiency was reduced at 60.9%. The total sleep time was 236.5 minutes. The patient spent 14.59% of the night in stage N1 sleep, 76.96% in stage N2 sleep, 0.00% in stage N3 and 8.46% in REM.Stage REM latency was 158.5 minutes Wake after  sleep onset was 137.0. Alpha intrusion was absent. Supine sleep was 61.73%.  CARDIAC DATA The 2 lead EKG demonstrated sinus rhythm. The mean heart rate was 48.81 beats per minute. Other EKG findings include: PVCs, ventricular couplets and nonsustained ventricular tachycardia up to 7 beats.  LEG MOVEMENT DATA The total Periodic Limb Movements of Sleep (PLMS) were 3. The PLMS index was 0.76. A PLMS index of <15 is considered normal in adults.  IMPRESSIONS - The optimal PAP pressure could not be attained during this study.   - Mild Central Sleep Apnea was noted during this titration (CAI = 5.3/h). - Mild oxygen desaturations were observed during this titration (min O2 = 89.00%). - No snoring was audible during this study. - 2-lead EKG demonstrated: PVCs, ventricular couplets and nonsustained ventricular tachycardia up to 7 beats. - Clinically significant periodic limb movements were not noted during this study. Arousals associated with PLMs were rare.  DIAGNOSIS - Obstructive Sleep Apnea (327.23 [G47.33 ICD-10])  RECOMMENDATIONS - Trial of auto CPAP therapy from 5 to 18 cm H2O with a Standard size Resmed Nasal Mask Swift FX Nano mask and heated humidification. - Avoid alcohol, sedatives and other CNS depressants that may worsen sleep apnea and disrupt normal sleep architecture. - Sleep hygiene should be reviewed to assess factors that may improve sleep quality. - Weight management and regular exercise should be initiated or continued. - Return to Sleep Center for re-evaluation after 10 weeks of therapy  [place], American Board of Sleep Medicine  ELECTRONICALLY SIGNED ON:  02/17/2015, 2:00 PM [place] PH: [phone]   FX: [phone] [phone]

## 2015-02-19 ENCOUNTER — Telehealth: Payer: Self-pay | Admitting: Cardiology

## 2015-02-19 NOTE — Telephone Encounter (Signed)
Follow UP   Pt returned cal

## 2015-02-19 NOTE — Telephone Encounter (Signed)
Spoke with patient to let him know that could have him follow up with Dr. Claiborne Billings.   He is requesting a brief phone conversation with Turner. He has had a conversation with Dr. Toy Cookey and is wanting to Talk to Dr. Radford Pax about this before any decision is made. If a CPAP is still the recommended treatment, he is requesting that we hold off until the follow-up time would be available for Dr. Radford Pax.  I have routed this to Dr. Radford Pax for guidance.

## 2015-02-19 NOTE — Telephone Encounter (Signed)
Left message for patient to call back

## 2015-02-19 NOTE — Telephone Encounter (Signed)
See previous phone note.  

## 2015-02-20 ENCOUNTER — Other Ambulatory Visit (HOSPITAL_COMMUNITY): Payer: Self-pay | Admitting: Internal Medicine

## 2015-02-20 NOTE — Telephone Encounter (Signed)
Discussed with patient that given his pulmonary HTN, nocturnal hypoxemia and moderate OSA, the treatment of choice is CPAP therapy and not the oral device.  Patient agrees go proceed with CPAP.

## 2015-02-22 NOTE — Telephone Encounter (Signed)
Per [name], he is going to proceed with CPAP. He asks that we wait until after Christmas.  I will submit order to [facility] the week after christmas.

## 2015-02-22 NOTE — Telephone Encounter (Signed)
Received a letter from Dr. 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  Once I find out which way he is going to go with treatment, I will submit the paperwork to the correct people.   I have left a message for patient to call me to discuss.

## 2015-03-04 ENCOUNTER — Other Ambulatory Visit (HOSPITAL_COMMUNITY): Payer: Self-pay | Admitting: Cardiology

## 2015-03-12 DIAGNOSIS — H2513 Age-related nuclear cataract, bilateral: Secondary | ICD-10-CM | POA: Diagnosis not present

## 2015-03-12 DIAGNOSIS — H353130 Nonexudative age-related macular degeneration, bilateral, stage unspecified: Secondary | ICD-10-CM | POA: Diagnosis not present

## 2015-03-12 DIAGNOSIS — H43813 Vitreous degeneration, bilateral: Secondary | ICD-10-CM | POA: Diagnosis not present

## 2015-03-20 ENCOUNTER — Ambulatory Visit (HOSPITAL_BASED_OUTPATIENT_CLINIC_OR_DEPARTMENT_OTHER): Payer: Medicare Other

## 2015-03-20 ENCOUNTER — Encounter: Payer: Self-pay | Admitting: *Deleted

## 2015-03-20 ENCOUNTER — Telehealth: Payer: Self-pay | Admitting: Pulmonary Disease

## 2015-03-20 NOTE — Telephone Encounter (Signed)
[name] is available to speak now. Please return call.

## 2015-03-20 NOTE — Telephone Encounter (Signed)
Spoke with pt, requesting an appt with BQ-pt c/o his nonprod cough becoming more harsh, "has a throat component".  Pt wishes to be seen before next available at the end of March.  BQ please advise if this pt can be worked in somewhere in your schedule.  Thanks!

## 2015-03-20 NOTE — Telephone Encounter (Signed)
See if you can find a day when I am not overbooked and go ahead and over book him  But please offer a visit with TP as well

## 2015-03-20 NOTE — Telephone Encounter (Signed)
Called spoke with pt. appt scheduled for 03/26/15 at 2pm to see Dr. Lake Bells as TP not in office.

## 2015-03-20 NOTE — Telephone Encounter (Signed)
Called and spoke with patient. Pt states he can not talk at this time, he is on the phone with a call from [place] and would return our call.

## 2015-03-25 MED FILL — BISOPROLOL FUMARATE 5 MG TA: 5 | 30 days supply | Qty: 15 | Fill #1

## 2015-03-26 ENCOUNTER — Encounter: Payer: Self-pay | Admitting: Pulmonary Disease

## 2015-03-26 ENCOUNTER — Other Ambulatory Visit: Payer: Medicare Other

## 2015-03-26 ENCOUNTER — Ambulatory Visit (INDEPENDENT_AMBULATORY_CARE_PROVIDER_SITE_OTHER): Payer: Medicare Other | Admitting: Pulmonary Disease

## 2015-03-26 ENCOUNTER — Ambulatory Visit (INDEPENDENT_AMBULATORY_CARE_PROVIDER_SITE_OTHER)
Admission: RE | Admit: 2015-03-26 | Discharge: 2015-03-26 | Disposition: A | Discharge status: Home / Self Care | Payer: Medicare Other | Source: Ambulatory Visit | Attending: Pulmonary Disease | Admitting: Pulmonary Disease

## 2015-03-26 VITALS — BP 132/66 | HR 58 | Ht 69.75 in | Wt 155.6 lb

## 2015-03-26 DIAGNOSIS — R059 Cough, unspecified: Secondary | ICD-10-CM | POA: Insufficient documentation

## 2015-03-26 DIAGNOSIS — I5022 Chronic systolic (congestive) heart failure: Secondary | ICD-10-CM

## 2015-03-26 DIAGNOSIS — R05 Cough: Secondary | ICD-10-CM | POA: Diagnosis not present

## 2015-03-26 DIAGNOSIS — I429 Cardiomyopathy, unspecified: Secondary | ICD-10-CM | POA: Diagnosis not present

## 2015-03-26 DIAGNOSIS — R0602 Shortness of breath: Secondary | ICD-10-CM | POA: Diagnosis not present

## 2015-03-26 DIAGNOSIS — K219 Gastro-esophageal reflux disease without esophagitis: Secondary | ICD-10-CM | POA: Diagnosis not present

## 2015-03-26 NOTE — Assessment & Plan Note (Signed)
He noted some increasing dyspnea in the last month associated with this cough. Again, he's never been found to have significant pulmonary parenchymal abnormality. Today on exam his lungs are clear to auscultation and his oxygenation is normal.  Plan: Given his concern and his known cardiomyopathy I will check a pro BNP to see if there is evidence of worsening heart failure. Check a chest x-ray

## 2015-03-26 NOTE — Assessment & Plan Note (Signed)
As above, I believe this is contributing to his cough.  We discussed lifestyle modifications, I recommended propping the head of his bed up, and I recommended antacid therapy. He prefers to avoid antacid therapy for fear of side effects.

## 2015-03-26 NOTE — Assessment & Plan Note (Addendum)
I believe that his cough is do to upper airway irritation primarily.  He is not producing mucus, and he describes a scratchy sensation in his throat. His lungs are clear on exam today. The differential diagnosis includes a mild viral laryngitis which is now improving, laryngeal irritation which perpetuates the cough, and gastroesophageal reflux disease. In all likelihood, all 3 of these are likely a plate. I do not strongly suspect a pulmonary component given his clear lungs, recent normal chest x-ray, and normal oxygenation on exam.  He is a bit concerned that this may be related to his congestive heart failure because he feels overall he has less energy is a bit more dyspneic. On my exam today he does not appear to be volume overloaded.  Plan: Gastroesophageal reflux disease lifestyle modifications were reviewed I recommended antacid therapy, he would prefer to avoid anti-acid therapy I recommended voice rest Chest x-ray

## 2015-03-26 NOTE — Progress Notes (Signed)
Subjective:    Patient ID: Caleb Moment, PhD, male    DOB: [birth year], 75 y.o.   MRN: [id number] Synopsis: First seen in 05/2014 for second opinion on pulmonary hypertension. Came back in 03/2015 for assessment of a cough.  He has ischemic cardiomyopathy.  HPI Chief Complaint  Patient presents with  . Follow-up    pt c/.o inceased nonprod cough worse since undergoing sleep study X1 month ago. States the cough seems to come from his throat, not his chest. Notes some chronic PND.      Traquan says that after his sleep study recently he has been having some mild discomfort in his throat which is making him cough more.  His cough has improved in the last few days but the cough has been progressive and worse at night.  He is worried that he may have pneumonia or that his heart failure is worse.  No more swelling than normal. Left more than right. Can lie flat without difficulty. He has had chills, no fever.  He says that in general he is feeling more cold. He feels like his breathing has been a bit worse in the last few weeks as well. He says that his symptoms have been quite variable in the last several months.  He had a sore throat, but no headache, no sinus symptoms, no myalgias.  He has not been coughing up.  He is taking a supplement which is a "cardio expander" > some sort of a supplement that helps with pulmonary vasodilation.  Past Medical History  Diagnosis Date  . CAD (coronary artery disease)   . Atrial fibrillation ([place])   . Cardiomyopathy, ischemic   . Heart failure, systolic, acute on chronic ([place])   . Hypertension   . Hyperlipidemia   . Pleural effusion   . Positional vertigo   . Dizziness   . GERD (gastroesophageal reflux disease)   . Atypical pneumonia   . Cough   . Allergic rhinitis   . TIA (transient ischemic attack)   . OSA (obstructive sleep apnea)     Home sleep test 07/05/2009 AHI 8.2  . Chronic anticoagulation       Review of Systems  Constitutional:  Positive for fatigue. Negative for fever and chills.  HENT: Negative for postnasal drip, rhinorrhea and sinus pressure.   Respiratory: Positive for cough and shortness of breath. Negative for wheezing.   Cardiovascular: Positive for leg swelling. Negative for chest pain and palpitations.       Objective:   Physical Exam Filed Vitals:   03/26/15 1359  BP: 132/66  Pulse: 58  Height: 5' 9.75" (1.772 m)  Weight: 155 lb 9.6 oz (70.58 kg)  SpO2: 100%   RA  Gen: well appearing HENT: OP clear, EOMi, neck supple PULM: CTA B, normal percussion CV: RRR, systolic murmur left lower sternal border, extremities well perfused GI: BS+, soft, nontender Derm: no cyanosis or rash Psyche: normal mood and affect  Dr. Claris Gladden most recent note reviewed where he was seen for cardiomyopthy and pulmonary hypertension CXR from 05/2014 reviewed again, no pulmonary parenchymal disease      Assessment & Plan:  Cough I believe that his cough is do to upper airway irritation primarily.  He is not producing mucus, and he describes a scratchy sensation in his throat. His lungs are clear on exam today. The differential diagnosis includes a mild viral laryngitis which is now improving, laryngeal irritation which perpetuates the cough, and gastroesophageal reflux disease. In all likelihood,  all 3 of these are likely a plate. I do not strongly suspect a pulmonary component given his clear lungs, recent normal chest x-ray, and normal oxygenation on exam.  He is a bit concerned that this may be related to his congestive heart failure because he feels overall he has less energy is a bit more dyspneic. On my exam today he does not appear to be volume overloaded.  Plan: Gastroesophageal reflux disease lifestyle modifications were reviewed I recommended antacid therapy, he would prefer to avoid anti-acid therapy I recommended voice rest Chest x-ray  Cardiomyopathy [facility]) He noted some increasing dyspnea in the last  month associated with this cough. Again, he's never been found to have significant pulmonary parenchymal abnormality. Today on exam his lungs are clear to auscultation and his oxygenation is normal.  Plan: Given his concern and his known cardiomyopathy I will check a pro BNP to see if there is evidence of worsening heart failure. Check a chest x-ray  Acid reflux As above, I believe this is contributing to his cough.  We discussed lifestyle modifications, I recommended propping the head of his bed up, and I recommended antacid therapy. He prefers to avoid antacid therapy for fear of side effects.  > 50% of time spent face to face as part of 42 minutes total for this visit   Current outpatient prescriptions:  .  aspirin 81 MG chewable tablet, Chew 81 mg by mouth daily., Disp: , Rfl:  .  bisoprolol (ZEBETA) 5 MG tablet, Take 0.5 tablets (2.5 mg total) by mouth daily., Disp: 30 tablet, Rfl: 3 .  digoxin (LANOXIN) 0.125 MG tablet, Take 1 tablet (0.125 mg total) by mouth daily., Disp: 90 tablet, Rfl: 3 .  eplerenone (INSPRA) 25 MG tablet, TAKE 1 TABLET BY MOUTH ONCE DAILY, Disp: 30 tablet, Rfl: 6 .  furosemide (LASIX) 40 MG tablet, Take 2 tabs EVERY AM and 1 tab every other PM (Patient taking differently: 2 tabs qam), Disp: 90 tablet, Rfl: 6 .  Magnesium 100 MG CAPS, Take 1 capsule by mouth 2 (two) times daily. , Disp: , Rfl:  .  Omega-3 Fatty Acids (THE VERY FINEST FISH OIL) LIQD, Take 2-3 g by mouth daily. , Disp: , Rfl:  .  Tadalafil, PAH, (ADCIRCA) 20 MG TABS, Take 1 tablet (20 mg total) by mouth daily., Disp: 30 tablet, Rfl: 6 .  valsartan (DIOVAN) 40 MG tablet, Take 1 tablet (40 mg total) by mouth 2 (two) times daily., Disp: 60 tablet, Rfl: 3 .  Zinc 100 MG TABS, Take 1 tablet by mouth daily. , Disp: , Rfl:

## 2015-03-26 NOTE — Patient Instructions (Signed)
For the ongoing cough: You need to try to suppress your cough to allow your larynx (voice box) to heal.  For three days don't talk, laugh, sing, or clear your throat. Do everything you can to suppress the cough during this time. Use hard candies (sugarless Jolly Ranchers) or non-mint or non-menthol containing cough drops during this time to soothe your throat.  Use a cough suppressant (Delsym or what I have prescribed you) around the clock during this time.  After three days, gradually increase the use of your voice and back off on the cough suppressants.  Follow the acid reflux lifestyle modifications.  If the cough doesn't improve then consider taking an acid reducer like pepcid.  We will call you with the results.

## 2015-03-27 ENCOUNTER — Telehealth: Payer: Self-pay | Admitting: *Deleted

## 2015-03-27 LAB — PRO B NATRIURETIC PEPTIDE: Pro B Natriuretic peptide (BNP): 2065 pg/mL — ABNORMAL HIGH (ref <451)

## 2015-03-27 NOTE — Telephone Encounter (Signed)
Spoke with pt, aware of BQ's recs.  Nothing further needed.

## 2015-03-27 NOTE — Telephone Encounter (Signed)
Sure, OK to hold off for a week

## 2015-03-27 NOTE — Telephone Encounter (Signed)
Called spoke with pt about results. He verbalized understanding. He is suppose to be getting set up on CPAP but is wanting to know if Dr. Lake Bells feels he should hold off on CPAP until his throat "clears up". He doesn't want to irritate it any more than it already is. Please advise thanks

## 2015-03-28 ENCOUNTER — Telehealth (HOSPITAL_COMMUNITY): Payer: Self-pay

## 2015-03-28 NOTE — Telephone Encounter (Signed)
Patient called to report elevated P-BNP (2,000 now, was 200 10 mo ago). Patient and Dr. Lake Bells patient have sooner apt with [name], however, nothing available until 1-2 weeks from now. Will forward to MD to see if there are any other suggestions he may have, and also when he thinks he can see patient with current full schedule.  Renee Pain

## 2015-03-28 NOTE — Telephone Encounter (Signed)
Just work him in one day next week at some point, easier if you put him as last patient, maybe at 4 pm one day.

## 2015-04-01 ENCOUNTER — Ambulatory Visit (HOSPITAL_COMMUNITY)
Admission: RE | Admit: 2015-04-01 | Discharge: 2015-04-01 | Disposition: A | Discharge status: Home / Self Care | Payer: Medicare Other | Source: Ambulatory Visit | Attending: Cardiology | Admitting: Cardiology

## 2015-04-01 VITALS — BP 138/64 | HR 62 | Wt 153.5 lb

## 2015-04-01 DIAGNOSIS — Z951 Presence of aortocoronary bypass graft: Secondary | ICD-10-CM | POA: Diagnosis not present

## 2015-04-01 DIAGNOSIS — Z7982 Long term (current) use of aspirin: Secondary | ICD-10-CM | POA: Diagnosis not present

## 2015-04-01 DIAGNOSIS — I255 Ischemic cardiomyopathy: Secondary | ICD-10-CM | POA: Insufficient documentation

## 2015-04-01 DIAGNOSIS — I252 Old myocardial infarction: Secondary | ICD-10-CM | POA: Diagnosis not present

## 2015-04-01 DIAGNOSIS — K219 Gastro-esophageal reflux disease without esophagitis: Secondary | ICD-10-CM | POA: Diagnosis not present

## 2015-04-01 DIAGNOSIS — G629 Polyneuropathy, unspecified: Secondary | ICD-10-CM | POA: Insufficient documentation

## 2015-04-01 DIAGNOSIS — I872 Venous insufficiency (chronic) (peripheral): Secondary | ICD-10-CM | POA: Diagnosis not present

## 2015-04-01 DIAGNOSIS — I2510 Atherosclerotic heart disease of native coronary artery without angina pectoris: Secondary | ICD-10-CM | POA: Insufficient documentation

## 2015-04-01 DIAGNOSIS — E785 Hyperlipidemia, unspecified: Secondary | ICD-10-CM | POA: Insufficient documentation

## 2015-04-01 DIAGNOSIS — G4733 Obstructive sleep apnea (adult) (pediatric): Secondary | ICD-10-CM | POA: Diagnosis not present

## 2015-04-01 DIAGNOSIS — I5022 Chronic systolic (congestive) heart failure: Secondary | ICD-10-CM

## 2015-04-01 DIAGNOSIS — I6523 Occlusion and stenosis of bilateral carotid arteries: Secondary | ICD-10-CM | POA: Insufficient documentation

## 2015-04-01 DIAGNOSIS — Z8249 Family history of ischemic heart disease and other diseases of the circulatory system: Secondary | ICD-10-CM | POA: Insufficient documentation

## 2015-04-01 DIAGNOSIS — I509 Heart failure, unspecified: Secondary | ICD-10-CM | POA: Diagnosis not present

## 2015-04-01 DIAGNOSIS — I272 Other secondary pulmonary hypertension: Secondary | ICD-10-CM | POA: Diagnosis not present

## 2015-04-01 DIAGNOSIS — I351 Nonrheumatic aortic (valve) insufficiency: Secondary | ICD-10-CM | POA: Insufficient documentation

## 2015-04-01 DIAGNOSIS — I5032 Chronic diastolic (congestive) heart failure: Secondary | ICD-10-CM | POA: Diagnosis not present

## 2015-04-01 DIAGNOSIS — Z79899 Other long term (current) drug therapy: Secondary | ICD-10-CM | POA: Insufficient documentation

## 2015-04-01 DIAGNOSIS — Z8673 Personal history of transient ischemic attack (TIA), and cerebral infarction without residual deficits: Secondary | ICD-10-CM | POA: Diagnosis not present

## 2015-04-01 DIAGNOSIS — Z9889 Other specified postprocedural states: Secondary | ICD-10-CM | POA: Diagnosis not present

## 2015-04-01 LAB — BASIC METABOLIC PANEL
Anion gap: 15 (ref 5–15)
BUN: 25 mg/dL — ABNORMAL HIGH (ref 6–20)
CO2: 23 mmol/L (ref 22–32)
Calcium: 9.7 mg/dL (ref 8.9–10.3)
Chloride: 97 mmol/L — ABNORMAL LOW (ref 101–111)
Creatinine, Ser: 1.1 mg/dL (ref 0.61–1.24)
GFR calc Af Amer: >60 mL/min (ref >60)
GFR calc non Af Amer: >60 mL/min (ref >60)
Glucose, Bld: 108 mg/dL — ABNORMAL HIGH (ref 65–99)
Potassium: 4.3 mmol/L (ref 3.5–5.1)
Sodium: 135 mmol/L (ref 135–145)

## 2015-04-01 LAB — DIGOXIN LEVEL: Digoxin Level: 0.3 ng/mL — ABNORMAL LOW (ref 0.8–2.0)

## 2015-04-01 LAB — BRAIN NATRIURETIC PEPTIDE: B Natriuretic Peptide: 243.7 pg/mL — ABNORMAL HIGH (ref 0.0–100.0)

## 2015-04-01 LAB — MAGNESIUM: MAGNESIUM: 2.2 mg/dL (ref 1.7–2.4)

## 2015-04-01 LAB — FIBRINOGEN: FIBRINOGEN: 357 mg/dL (ref 204–475)

## 2015-04-01 MED ORDER — VALSARTAN 40 MG PO TABS: ORAL_TABLET | ORAL | Status: DC

## 2015-04-01 MED ORDER — FUROSEMIDE 40 MG PO TABS: ORAL_TABLET | ORAL | Status: DC

## 2015-04-01 MED FILL — VALSARTAN 40 MG TABLET: 40 | 30 days supply | Qty: 90 | Fill #0

## 2015-04-01 MED FILL — FUROSEMIDE 40 MG TABLET: 40 | 30 days supply | Qty: 90 | Fill #0

## 2015-04-01 NOTE — Patient Instructions (Signed)
INCREASE Lasix to 109m in the AM and 496min the PM.  INCREASE Valsartan to 4088mn the AM and 64m39m the PM.  Routine lab work today. Will notify you of abnormal results  Please follow up in 1 week with Dr.McLean.  Labs: 1 week bmet  Your provider requests you have an echocardiogram

## 2015-04-02 LAB — HEMOGLOBIN A1C
HEMOGLOBIN A1C: 5.6 % (ref 4.8–5.6)
MEAN PLASMA GLUCOSE: 114 mg/dL

## 2015-04-02 LAB — HIGH SENSITIVITY CRP: CRP, High Sensitivity: 2.31 mg/L (ref 0.00–3.00)

## 2015-04-02 NOTE — Progress Notes (Signed)
Patient ID: Caleb Moment, PhD, male   DOB: 08/17/1940, 75 y.o.   MRN: [id number] PCP: Dr. Sherren Mocha EP: [name]  75 yo with complex past history presents for heart failure evaluation.  Patient had anterior MI in 2004 and developed ischemic cardiomyopathy as well as mitral regurgitation. He also developed atrial fibrillation.  In 10/14, he had MV repair, Maze, CABG with LIMA-LAD, and LA appendage closure at [facility] in [place].  Subsequently, atrial fibrillation returned and he had an atrial fibrillation ablation in [facility] in 3/15.  He wore a Zio patch in 12/15 and had a low atrial fibrillation burden of 7%.  He has had a long-standing ischemic cardiomyopathy.  In 2015, EF was 20-25%.  Echo in 2016 also showed EF 25-30% but estimated PA pressure suggested severe pulmonary hypertension, which is new for him.    At initial appointment, he reported increased exertional dyspnea over a number of weeks.  RHC was done, showing mildly elevated PCWP with moderate pulmonary HTN and low cardiac index (1.93 thermo, 2.13 Fick).  V/Q scan showed no PE.  I started him on digoxin and have titrated his Lasix to 80 mg daily.  He is now on bisoprolol and able to tolerate it.  At last appointment, I had him try replacing valsartan with Entresto 24/26 bid.  He was unable to tolerate it (made him dizzy, though BP was not low).  Therefore, I had him restart valsartan.  CPX in 4/16 showed mildly decreased functional capacity.  He has tolerated Adcirca 20 mg daily.  He did not tolerate an attempt to uptitrate bisoprolol.  [name] presents today for an acute visit.  Starting last week, his weight began to rise.  He developed dyspnea after walking about a block or going up an incline.  No exertional chest pain.  No orthopnea/PND.  Brief palpitations last Friday, none since.  BP has been running higher.  I had him increase both his Lasix and his valsartan over the weekend.  Since increasing this Lasix, his breathing has come  back to normal.  Weight is actually now down 2 lbs compared to prior appointment here.   6 minute walk (3/16): 381 m  6 minute walk (5/16): 414.5 m 6 minute walk (10/16): 562 m  ECG: NSR, IVCD 130 msec  Labs (2/15): LDL 144 Labs (8/15): K 4.6, creatinine 0.9 Labs (12/15): HCT 42.3   Labs (2/16): K 4 => 4.2, creatinine 1.05 => 0.92, BNP 268 Labs (3/16): BNP 495 => 320, digoxin 0.4, RF 14.7 (very mild increase), TSH normal, HIV negative, anti-SCL70 negative, creatinine 0.91, K 4.4 Labs (7/16): K 5, creatinine 1.05, BNP 455, vitamin D normal, digoxin 0.4, B12 normal Labs (8/16): digoxin 0.8, BNP 305, K 4.2, creatinine 0.99 Labs (9/16): HCT 43.3, TSH normal, BNP 492 => 278, K 4.3, creatinine 0.97 => 1.04, digoxin 0.6, TSH normal, LDL 154, LDL-P 1654.  Labs (10/16): K 4.7, creatinine 1.1 Labs (1/17): pro-BNP 2065  PMH: 1. CAD: Anterior MI in 2004.  Cardiac surgery in 10/14 included LIMA-LAD.  2. Chronic mitral regurgitation: 10/14 surgery at [facility] with MV repair, Maze, LIMA-LAD, and LA appendage closure.  3. Atrial fibrillation: Paroxysmal.  H/o Maze in 2014.  Had recurrent atrial fibrillation with ablation in 3/15 by Dr Ola Spurr in [facility].  Not anticoagulated after 2 severe prostate bleeding episodes.  - Zio patch in 12/15 with low atrial fibrillation burden (7%).  - Holter (9/16) with PACs, PVCs, short atrial fibrillation runs (nothing sustained).  4. Chronotropic incompetence.  5. HTN 6. Hyperlipidemia: Refuses statin.  7. Peripheral neuropathy 8. GERD 9. H/o BPPV 10. H/o TIA 11. Ischemic cardiomyopathy: cardiac MRI 7/14 with EF 35%, moderate MR, normal RV size and systolic function, extensive anterior and anteroseptal LGE suggestive of non-viable myocardium (this was prior to LIMA-LAD).  Echo 1/15 with EF 20-25%, moderate AI, PA systolic pressure 39 mmHg. Echo (1/16) with EF 20-25%, diffuse hypokinesis with regionality, moderate LV dilation, moderate AI, s/p MV repair with  mild MR and normal gradients, RV dilated with mildly decreased systolic function, PA systolic pressure 71 mmHg.  RHC (2/16) with mean RA 6, PA 61/25 mean 40, mean PCWP 23, CI 2.13/PVR 4.3 (Fick), CI 1.93/PVR 4.7 (thermo).  CPX (4/16) with peak VO2 17.9, VE/VCO2 34.7 => mildly decreased functional capacity.  - Spironolactone apparently caused cognitive deficits - Intolerant of Coreg due to development of severe alopecia - Unable to uptitrate bisoprolol due to intolerance.  - Lightheaded with Entresto.  12. Carotid dopplers (1/16) with 40-59% bilateral ICA stenosis.  13. Aortic insufficiency: Moderate by last echo 2/16.  14. Pulmonary HTN: Mixed PAH and pulmonary venous hypertension.  PFTs (9/14) were normal.  V/Q scan (2/16) with no evidence of acute or chronic PE.  6 minute walk (3/16) 381 m. 6 minute walk (5/16) 414.5 m.  15. OSA: Moderate on 5/16 sleep study.  16. Venous insufficiency  SH: Married, lives in [place], Careers information officer, nonsmoker  FH: CAD  ROS: All systems reviewed and negative except as per HPI.   Current Outpatient Prescriptions  Medication Sig Dispense Refill  . aspirin 81 MG chewable tablet Chew 81 mg by mouth daily.    . bisoprolol (ZEBETA) 5 MG tablet Take 0.5 tablets (2.5 mg total) by mouth daily. 30 tablet 3  . digoxin (LANOXIN) 0.125 MG tablet Take 1 tablet (0.125 mg total) by mouth daily. 90 tablet 3  . eplerenone (INSPRA) 25 MG tablet TAKE 1 TABLET BY MOUTH ONCE DAILY 30 tablet 6  . furosemide (LASIX) 40 MG tablet Take 2 tabs EVERY AM and 1 tab EVERY PM 90 tablet 6  . Magnesium 100 MG CAPS Take 1 capsule by mouth 2 (two) times daily.     . Omega-3 Fatty Acids (THE VERY FINEST FISH OIL) LIQD Take 2-3 g by mouth daily.     . Tadalafil, PAH, (ADCIRCA) 20 MG TABS Take 1 tablet (20 mg total) by mouth daily. 30 tablet 6  . valsartan (DIOVAN) 40 MG tablet Take 1 tablet every morning and 2 tablets every evening. 90 tablet 3  . Zinc 100 MG TABS Take 1 tablet by  mouth daily.      No current facility-administered medications for this encounter.   BP 138/64 mmHg  Pulse 62  Wt 153 lb 8 oz (69.627 kg)  SpO2 99% General: NAD Neck: No JVD, no thyromegaly or thyroid nodule.  Lungs: Clear to auscultation bilaterally with normal respiratory effort. CV: Lateral PMI.  Heart regular S1/S2, no S3/S4, 1/6 HSM apex, 1/6 diastolic murmur along the sternal border.  Trace ankle edema.  No carotid bruit.  Normal pedal pulses.  Abdomen: Soft, nontender, no hepatosplenomegaly, no distention.  Skin: Intact without lesions or rashes.  Neurologic: Alert and oriented x 3.  Psych: Normal affect. Extremities: No clubbing or cyanosis.  HEENT: Normal.   Assessment/Plan: 1. CAD: This seems stable, no chest pain. S/p LIMA-LAD.  He is on ASA 81.  He has decided not to take statins after reviewing the data.  We have discussed this and agree to disagree. 2. S/p mitral valve repair: The MV repair looked stable on last echo with mild MR and no evidence for significant mitral stenosis.  3. Aortic insufficiency: Moderate on last echo, similar to prior.  Diastolic murmur on exam. I am going to have him get a repeat echo.  4. Chronic systolic CHF: Ischemic cardiomyopathy, EF 20-25% in 1/16.  However, estimated PA pressure was higher on 1/16 echo than in past and symptoms worsened.  RHC done after this showed moderate pulmonary hypertension.  Mildly decreased functional capacity on 4/16 CPX.  NYHA class III symptoms, improved with increased Lasix over the weekend.  He does not look volume overloaded on exam at this point though symptomatically, I think he had a mild exacerbation last week.  - Continue higher dose of Lasix, 80 qam/40 qpm.  BMET/Mg/BNP today and repeat in 1 week with followup in the office.   - Continue digoxin, check level today.  - Continue bisoprolol 2.5 mg daily (unable to increase to 2.5 bid).   - Continue valsartan 40 qam/80 qpm (Increased this weekend). He did not  tolerate Entresto.    - Continue eplerenone.      - He does not want an ICD and understands the reasoning for having one.  - In the future, he could be an LVAD candidate.  - He is interested in stem cell therapy and has been doing research on possible clinical trials. - As above, given clinical worsening, will get echo.   5. Atrial fibrillation: s/p Maze in 10/14, then ablation in 3/15. 7% atrial fibrillation on Zio patch in 12/15.  His most recent holter monitor showed occasional PVCs and PACs and a couple of short runs that appeared to be atrial fibrillation (nothing sustained).  His palpitations may actually be due to the PACs and PVCs.    - Currently, he is not anticoagulated due to history of prostate bleeding.  I am going to readdress this with him next appointment, think it would be reasonable to try a DOAC.   - Continue bisoprolol.  6. Pulmonary hypertension: Severe by echo (was mild on prior echo in 1/15), moderate by last RHC.  Mixed pulmonary venous and pulmonary arterial HTN.  It is possible that the [facility] component is due to pulmonary vascular remodeling in the setting of chronic mitral regurgitation prior to MV repair. Negative V/Q scan, no evidence for CTEPH.  PFTs normal in 9/14.  He is seeing Dr Lake Bells.  Sleep study showed moderate OSA.  He has started on Adcirca and felt like it helped, he does not want to increase it to 40 mg daily.   - 6 minute walk next appointment.  7. OSA: Moderate.  He is waiting to get CPAP for home.    Loralie Champagne 04/02/2015

## 2015-04-03 ENCOUNTER — Ambulatory Visit (HOSPITAL_COMMUNITY): Payer: Medicare Other | Attending: Cardiovascular Disease

## 2015-04-03 ENCOUNTER — Other Ambulatory Visit: Payer: Self-pay

## 2015-04-03 DIAGNOSIS — I10 Essential (primary) hypertension: Secondary | ICD-10-CM | POA: Insufficient documentation

## 2015-04-03 DIAGNOSIS — I5032 Chronic diastolic (congestive) heart failure: Secondary | ICD-10-CM | POA: Diagnosis not present

## 2015-04-03 DIAGNOSIS — E785 Hyperlipidemia, unspecified: Secondary | ICD-10-CM | POA: Insufficient documentation

## 2015-04-04 ENCOUNTER — Telehealth (HOSPITAL_COMMUNITY): Payer: Self-pay | Admitting: *Deleted

## 2015-04-04 MED FILL — EPLERENONE 25 MG TABLET: 25 | 30 days supply | Qty: 30 | Fill #1

## 2015-04-04 NOTE — Telephone Encounter (Signed)
Pt called to ask about lab results, reviewed all results with pt, he also ask about echo, advised him Dr Aundra Dubin had not reviewed it yet, f/u app sch for next week

## 2015-04-05 ENCOUNTER — Other Ambulatory Visit (HOSPITAL_COMMUNITY): Payer: Self-pay | Admitting: *Deleted

## 2015-04-05 MED ORDER — TADALAFIL (PAH) 20 MG PO TABS: 20.0000 mg | ORAL_TABLET | Freq: Every day | ORAL | Status: DC

## 2015-04-10 ENCOUNTER — Ambulatory Visit (HOSPITAL_COMMUNITY)
Admission: RE | Admit: 2015-04-10 | Discharge: 2015-04-10 | Disposition: A | Discharge status: Home / Self Care | Payer: Medicare Other | Source: Ambulatory Visit | Attending: Cardiology | Admitting: Cardiology

## 2015-04-10 VITALS — BP 112/54 | HR 54 | Wt 155.4 lb

## 2015-04-10 DIAGNOSIS — I4891 Unspecified atrial fibrillation: Secondary | ICD-10-CM | POA: Insufficient documentation

## 2015-04-10 DIAGNOSIS — I255 Ischemic cardiomyopathy: Secondary | ICD-10-CM | POA: Diagnosis not present

## 2015-04-10 DIAGNOSIS — Z7982 Long term (current) use of aspirin: Secondary | ICD-10-CM | POA: Diagnosis not present

## 2015-04-10 DIAGNOSIS — I272 Other secondary pulmonary hypertension: Secondary | ICD-10-CM | POA: Insufficient documentation

## 2015-04-10 DIAGNOSIS — I110 Hypertensive heart disease with heart failure: Secondary | ICD-10-CM | POA: Diagnosis not present

## 2015-04-10 DIAGNOSIS — I872 Venous insufficiency (chronic) (peripheral): Secondary | ICD-10-CM | POA: Diagnosis not present

## 2015-04-10 DIAGNOSIS — K219 Gastro-esophageal reflux disease without esophagitis: Secondary | ICD-10-CM | POA: Insufficient documentation

## 2015-04-10 DIAGNOSIS — E785 Hyperlipidemia, unspecified: Secondary | ICD-10-CM | POA: Diagnosis not present

## 2015-04-10 DIAGNOSIS — I6523 Occlusion and stenosis of bilateral carotid arteries: Secondary | ICD-10-CM | POA: Diagnosis not present

## 2015-04-10 DIAGNOSIS — Z8673 Personal history of transient ischemic attack (TIA), and cerebral infarction without residual deficits: Secondary | ICD-10-CM | POA: Diagnosis not present

## 2015-04-10 DIAGNOSIS — G4733 Obstructive sleep apnea (adult) (pediatric): Secondary | ICD-10-CM | POA: Insufficient documentation

## 2015-04-10 DIAGNOSIS — Z8249 Family history of ischemic heart disease and other diseases of the circulatory system: Secondary | ICD-10-CM | POA: Diagnosis not present

## 2015-04-10 DIAGNOSIS — G629 Polyneuropathy, unspecified: Secondary | ICD-10-CM | POA: Diagnosis not present

## 2015-04-10 DIAGNOSIS — I252 Old myocardial infarction: Secondary | ICD-10-CM | POA: Diagnosis not present

## 2015-04-10 DIAGNOSIS — Z951 Presence of aortocoronary bypass graft: Secondary | ICD-10-CM | POA: Insufficient documentation

## 2015-04-10 DIAGNOSIS — I5022 Chronic systolic (congestive) heart failure: Secondary | ICD-10-CM | POA: Diagnosis not present

## 2015-04-10 DIAGNOSIS — I38 Endocarditis, valve unspecified: Secondary | ICD-10-CM

## 2015-04-10 DIAGNOSIS — I351 Nonrheumatic aortic (valve) insufficiency: Secondary | ICD-10-CM | POA: Insufficient documentation

## 2015-04-10 DIAGNOSIS — I482 Chronic atrial fibrillation, unspecified: Secondary | ICD-10-CM

## 2015-04-10 DIAGNOSIS — I2510 Atherosclerotic heart disease of native coronary artery without angina pectoris: Secondary | ICD-10-CM | POA: Diagnosis not present

## 2015-04-10 DIAGNOSIS — Z79899 Other long term (current) drug therapy: Secondary | ICD-10-CM | POA: Diagnosis not present

## 2015-04-10 DIAGNOSIS — Z9889 Other specified postprocedural states: Secondary | ICD-10-CM | POA: Diagnosis not present

## 2015-04-10 LAB — BASIC METABOLIC PANEL
Anion gap: 8 (ref 5–15)
BUN: 22 mg/dL — ABNORMAL HIGH (ref 6–20)
CALCIUM: 9.9 mg/dL (ref 8.9–10.3)
CO2: 28 mmol/L (ref 22–32)
CREATININE: 1.28 mg/dL — AB (ref 0.61–1.24)
Chloride: 101 mmol/L (ref 101–111)
GFR, EST NON AFRICAN AMERICAN: 53 mL/min — AB (ref >60)
Glucose, Bld: 109 mg/dL — ABNORMAL HIGH (ref 65–99)
Potassium: 4 mmol/L (ref 3.5–5.1)
SODIUM: 137 mmol/L (ref 135–145)

## 2015-04-10 NOTE — Patient Instructions (Signed)
Lab today  Please try to increase your Adcirca to 40 mg (2 tabs) daily, if unable to tolerate please let us know  We will contact you in 2 months to schedule your next appointment.

## 2015-04-10 NOTE — Progress Notes (Signed)
6 min walk test completed.  Pt ambulated 1590 ft (64 m), HR ranged 56-88 O2 sats ranged 96-98% on RA.

## 2015-04-10 NOTE — Progress Notes (Signed)
Patient ID: Caleb Moment, Caleb [name], male   DOB: [birth year], 75 y.o.   MRN: [id number] PCP: Dr. Sherren Mocha EP: [name]  75 yo with complex past history presents for heart failure evaluation.  Patient had anterior MI in 2004 and developed ischemic cardiomyopathy as well as mitral regurgitation. He also developed atrial fibrillation.  In 10/14, he had MV repair, Maze, CABG with LIMA-LAD, and LA appendage closure at [facility] in [place].  Subsequently, atrial fibrillation returned and he had an atrial fibrillation ablation in [facility] in 3/15.  He wore a Zio patch in 12/15 and had a low atrial fibrillation burden of 7%.  He has had a long-standing ischemic cardiomyopathy.  In 2015, EF was 20-25%.  Echo in 2016 also showed EF 25-30% but estimated PA pressure suggested severe pulmonary hypertension, which is new for him.    At initial appointment, he reported increased exertional dyspnea over a number of weeks.  RHC was done, showing mildly elevated PCWP with moderate pulmonary HTN and low cardiac index (1.93 thermo, 2.13 Fick).  V/Q scan showed no PE.  I started him on digoxin and have titrated his Lasix to 80 mg daily.  He is now on bisoprolol and able to tolerate it.  At last appointment, I had him try replacing valsartan with Entresto 24/26 bid.  He was unable to tolerate it (made him dizzy, though BP was not low).  Therefore, I had him restart valsartan.  CPX in 4/16 showed mildly decreased functional capacity.  He has tolerated Adcirca 20 mg daily.  He did not tolerate an attempt to uptitrate bisoprolol.  At last visit, patient was volume overloaded.  I increased his Lasix and valsartan.  He was unable to tolerate valsartan 40 qam/80 qpm so is now taking valsartan 40 qam/60 qpm.  He is breathing better => no dyspnea walking on flat ground.  He feels like he is back to baseline.  No orthopnea/PND.  No chest pain.  Some dyspnea with heavier exertion like walking fast up steps.  He tried CPAP but was unable to  tolerate it.  He had an echo this month, showing EF 30-35%, stable MV repair, mild AI, but moderately dilated RV with mildly decreased systolic function and PA systolic pressure 69 mmHg.   6 minute walk (3/16): 381 m  6 minute walk (5/16): 414.5 m 6 minute walk (10/16): 562 m 6 minute walk (1/17): 469 m  Labs (2/15): LDL 144 Labs (8/15): K 4.6, creatinine 0.9 Labs (12/15): HCT 42.3   Labs (2/16): K 4 => 4.2, creatinine 1.05 => 0.92, BNP 268 Labs (3/16): BNP 495 => 320, digoxin 0.4, RF 14.7 (very mild increase), TSH normal, HIV negative, anti-SCL70 negative, creatinine 0.91, K 4.4 Labs (7/16): K 5, creatinine 1.05, BNP 455, vitamin D normal, digoxin 0.4, B12 normal Labs (8/16): digoxin 0.8, BNP 305, K 4.2, creatinine 0.99 Labs (9/16): HCT 43.3, TSH normal, BNP 492 => 278, K 4.3, creatinine 0.97 => 1.04, digoxin 0.6, TSH normal, LDL 154, LDL-P 1654.  Labs (10/16): K 4.7, creatinine 1.1 Labs (1/17): pro-BNP 2065, digoxin 0.3, K 4.3, creatinine 1.10  PMH: 1. CAD: Anterior MI in 2004.  Cardiac surgery in 10/14 included LIMA-LAD.  2. Chronic mitral regurgitation: 10/14 surgery at [facility] with MV repair, Maze, LIMA-LAD, and LA appendage closure.  3. Atrial fibrillation: Paroxysmal.  H/o Maze in 2014.  Had recurrent atrial fibrillation with ablation in 3/15 by Dr Ola Spurr in [facility].  Not anticoagulated after 2 severe prostate bleeding  episodes.  - Zio patch in 12/15 with low atrial fibrillation burden (7%).  - Holter (9/16) with PACs, PVCs, short atrial fibrillation runs (nothing sustained). 4. Chronotropic incompetence.  5. HTN 6. Hyperlipidemia: Refuses statin.  7. Peripheral neuropathy 8. GERD 9. H/o BPPV 10. H/o TIA 11. Ischemic cardiomyopathy: cardiac MRI 7/14 with EF 35%, moderate MR, normal RV size and systolic function, extensive anterior and anteroseptal LGE suggestive of non-viable myocardium (this was prior to LIMA-LAD).  Echo 1/15 with EF 20-25%, moderate AI, PA systolic  pressure 39 mmHg. Echo (1/16) with EF 20-25%, diffuse hypokinesis with regionality, moderate LV dilation, moderate AI, s/p MV repair with mild MR and normal gradients, RV dilated with mildly decreased systolic function, PA systolic pressure 71 mmHg.  RHC (2/16) with mean RA 6, PA 61/25 mean 40, mean PCWP 23, CI 2.13/PVR 4.3 (Fick), CI 1.93/PVR 4.7 (thermo).  CPX (4/16) with peak VO2 17.9, VE/VCO2 34.7 => mildly decreased functional capacity.  - Spironolactone apparently caused cognitive deficits - Intolerant of Coreg due to development of severe alopecia - Unable to uptitrate bisoprolol due to intolerance.  - Lightheaded with Entresto.  - Echo (1/17) with EF 30-35%, moderate LV dilation, mild AI, s/p MV repair with mild MR, moderately dilated RV with mildly decreased systolic function, PA systolic pressure 69 mmHg.  12. Carotid dopplers (1/16) with 40-59% bilateral ICA stenosis.  13. Aortic insufficiency: Moderate by last echo 2/16.  14. Pulmonary HTN: Mixed PAH and pulmonary venous hypertension.  PFTs (9/14) were normal.  V/Q scan (2/16) with no evidence of acute or chronic PE.  6 minute walk (3/16) 381 m. 6 minute walk (5/16) 414.5 m. 6 minute walk (1/17) 469 m.  15. OSA: Moderate on 5/16 sleep study. Unable to tolerate CPAP.  16. Venous insufficiency  SH: Married, lives in [place], Engineer, water, nonsmoker  FH: CAD  ROS: All systems reviewed and negative except as per HPI.   Current Outpatient Prescriptions  Medication Sig Dispense Refill  . aspirin 81 MG chewable tablet Chew 81 mg by mouth daily.    . bisoprolol (ZEBETA) 5 MG tablet Take 0.5 tablets (2.5 mg total) by mouth daily. 30 tablet 3  . digoxin (LANOXIN) 0.125 MG tablet Take 1 tablet (0.125 mg total) by mouth daily. 90 tablet 3  . eplerenone (INSPRA) 25 MG tablet TAKE 1 TABLET BY MOUTH ONCE DAILY 30 tablet 6  . furosemide (LASIX) 40 MG tablet Take 2 tabs EVERY AM and 1 tab EVERY PM 90 tablet 6  . Magnesium 100 MG CAPS Take 1  capsule by mouth 2 (two) times daily.     . Omega-3 Fatty Acids (THE VERY FINEST FISH OIL) LIQD Take 2-3 g by mouth daily.     . Tadalafil, PAH, (ADCIRCA) 20 MG TABS Take 1 tablet (20 mg total) by mouth daily. 30 tablet 6  . valsartan (DIOVAN) 40 MG tablet Take 1 tablet every morning and 2 tablets every evening. (Patient taking differently: Take 1 tablet every morning and 1.5 tablets every evening.) 90 tablet 3  . Zinc 100 MG TABS Take 1 tablet by mouth daily.      No current facility-administered medications for this encounter.   BP 112/54 mmHg  Pulse 54  Wt 155 lb 6.4 oz (70.489 kg)  SpO2 97% General: NAD Neck: No JVD, no thyromegaly or thyroid nodule.  Lungs: Clear to auscultation bilaterally with normal respiratory effort. CV: Lateral PMI.  Heart regular S1/S2, no S3/S4, 2/6 HSM LLSB.  Trace ankle edema.  No carotid bruit.  Normal pedal pulses.  Abdomen: Soft, nontender, no hepatosplenomegaly, no distention.  Skin: Intact without lesions or rashes.  Neurologic: Alert and oriented x 3.  Psych: Normal affect. Extremities: No clubbing or cyanosis.  HEENT: Normal.   Assessment/Plan: 1. CAD: This seems stable, no chest pain. S/p LIMA-LAD.  He is on ASA 81.  He has decided not to take statins after reviewing the data.   2. S/p mitral valve repair: The MV repair looked stable on 1/17 echo with mild MR and no evidence for significant mitral stenosis.  3. Aortic insufficiency: Mild on 1/17 echo, improved from prior.  4. Chronic systolic CHF: Ischemic cardiomyopathy, EF 30-35% on 1/17 echo, somewhat improved.  However, PA pressure remains elevated by doppler measurement and the RV is moderately dilated with mildly decreased systolic function.  Mildly decreased functional capacity on 4/16 CPX.  He is improved on higher dose of Lasix, NYHA class II.  Euvolemic on exam. - Continue higher dose of Lasix, 80 qam/40 qpm.  BMET today.   - Continue digoxin, recent level ok.  - Continue bisoprolol 2.5  mg daily (unable to increase to 2.5 bid).   - Continue valsartan 40 qam/60 qpm.  He was unable to tolerate valsartan 40 qam/80 pm. He did not tolerate Entresto.    - Continue eplerenone.      - He does not want an ICD and understands the reasoning for having one.  - In the future, he could be an LVAD candidate if he worsens.  - He is interested in stem cell therapy and has been doing research on possible clinical trials.  5. Atrial fibrillation: s/p Maze in 10/14, then ablation in 3/15. 7% atrial fibrillation on Zio patch in 12/15.  His most recent holter monitor showed occasional PVCs and PACs and a couple of short runs that appeared to be atrial fibrillation (nothing sustained).  His palpitations may actually be due to the PACs and PVCs.    - Currently, he is not anticoagulated due to history of prostate bleeding.  I would like him to consider a DOAC, will discuss this with him at next appointment.  He has been reticent to be anticoagulated. - Continue bisoprolol.  6. Pulmonary hypertension: Moderate to severe by most recent echo in 1/17, moderate by last RHC.  The RV was moderately dilated and mildly dysfunction on most recent echo.  Mixed pulmonary venous and pulmonary arterial HTN.  It is possible that the [facility] component is due to pulmonary vascular remodeling in the setting of chronic mitral regurgitation prior to MV repair. Negative V/Q scan, no evidence for CTEPH.  PFTs normal in 9/14.  He is seeing Dr Lake Bells.  Sleep study showed moderate OSA but he has been unable to tolerated CPAP.  He has started on Adcirca and felt like it helped.  He did well on his 6 minute walk today but he did not go as far as in 10/16.   - I recommended that he increase Adcirca to 40 mg daily.  He wants to think about this and will get back to me.  7. OSA: Moderate.  Has not tolerated CPAP.    Loralie Champagne 04/10/2015

## 2015-04-11 ENCOUNTER — Encounter (HOSPITAL_COMMUNITY): Payer: Medicare Other

## 2015-04-16 ENCOUNTER — Telehealth: Payer: Self-pay | Admitting: Cardiology

## 2015-04-16 ENCOUNTER — Telehealth (HOSPITAL_COMMUNITY): Payer: Self-pay | Admitting: Pharmacist

## 2015-04-16 NOTE — Telephone Encounter (Signed)
Spoke with [facility] - patient has been on CPAP Therapy for 13 days and has only used the machine for 3 days and only a total of 1 hour 36 minutes.    [facility] states that Dr. Radford Pax will either need to put in an order to discontinue CPAP or he will have to sign a paper for [facility] stating that he is giving the machine back AMA.     Patient is asking if we will do the order or will he have to sign the AMA paper.      Routed to [facility]

## 2015-04-16 NOTE — Telephone Encounter (Signed)
Patient stated that he can no longer use his CPAP machine.  Patient states that the machine causes extreme dryness in his throat, a cough that lasts throughout the day, and he cannot sleep at all.  He said that he has had heartburn since using the machine.   Patient is returning the machine to [place].   Note has been routed to Dr. Radford Pax so that she is aware of this.

## 2015-04-16 NOTE — Telephone Encounter (Signed)
Valsartan 40 mg #3 tablets per day approved by [employer] through 03/22/16.  [name], PharmD, BCPS, CPP Clinical Pharmacist Pager: [phone] Phone: [phone] 04/16/2015 3:30 PM

## 2015-04-16 NOTE — Telephone Encounter (Signed)
New Message  Pt calling to speak w/ Bethany concerning returning a CPAP machine.Please call back and discuss.

## 2015-04-18 ENCOUNTER — Encounter (HOSPITAL_COMMUNITY): Payer: Medicare Other

## 2015-04-19 MED FILL — BISOPROLOL FUMARATE 5 MG TA: 5 | 30 days supply | Qty: 15 | Fill #2

## 2015-04-21 NOTE — Telephone Encounter (Signed)
Needs to sign the paper through [facility]

## 2015-04-23 NOTE — Telephone Encounter (Signed)
Patient has successfully returned machine.

## 2015-04-25 ENCOUNTER — Telehealth (HOSPITAL_COMMUNITY): Payer: Self-pay

## 2015-04-25 NOTE — Telephone Encounter (Signed)
Patient received call from [employer] saying they were not able to provide his funding for Adcirca for the rest of the year Patient given the number for [employer] [phone])

## 2015-04-29 ENCOUNTER — Encounter (HOSPITAL_COMMUNITY): Payer: Medicare Other

## 2015-05-06 MED FILL — FUROSEMIDE 40 MG TABLET: 40 | 30 days supply | Qty: 90 | Fill #1

## 2015-05-06 MED FILL — EPLERENONE 25 MG TABLET: 25 | 30 days supply | Qty: 30 | Fill #2

## 2015-05-10 MED FILL — ADCIRCA 20 MG TABLET: 20 | 30 days supply | Qty: 30 | Fill #1

## 2015-05-24 ENCOUNTER — Other Ambulatory Visit (HOSPITAL_COMMUNITY): Payer: Self-pay | Admitting: Cardiology

## 2015-05-24 MED FILL — KLOR-CON M20 TABLET: 20 | 30 days supply | Qty: 30 | Fill #2

## 2015-05-24 MED FILL — BISOPROLOL FUMARATE 5 MG TA: 5 | 30 days supply | Qty: 15 | Fill #3

## 2015-05-26 MED FILL — DIGOXIN 125 MCG TABLET: 125 | 90 days supply | Qty: 90 | Fill #0

## 2015-05-30 ENCOUNTER — Telehealth (HOSPITAL_COMMUNITY): Payer: Self-pay | Admitting: *Deleted

## 2015-05-30 MED FILL — VALSARTAN 40 MG TABLET: 40 | 30 days supply | Qty: 90 | Fill #1

## 2015-05-30 NOTE — Telephone Encounter (Signed)
Pt called to let us know he has been in a-fib for past 5-6 days constant.  He states HR is running 70-80s, he normally runs 50-60s and his BP has been a little low around 264 systolically, he is tolerating ok just gets a little light headed at times.  Discussed w/Dr Aundra Dubin, pt has be reluctant to start NOAC in past so is not on any anticoag, he would like to see pt in clinic to discuss and dev a plan.  Pt is aware and agreeable, appt sch Mon 3/13

## 2015-06-03 ENCOUNTER — Encounter (HOSPITAL_COMMUNITY): Payer: Self-pay

## 2015-06-03 ENCOUNTER — Ambulatory Visit (HOSPITAL_COMMUNITY)
Admission: RE | Admit: 2015-06-03 | Discharge: 2015-06-03 | Disposition: A | Discharge status: Home / Self Care | Payer: Medicare Other | Source: Ambulatory Visit | Attending: Cardiology | Admitting: Cardiology

## 2015-06-03 VITALS — BP 126/62 | HR 79 | Wt 154.5 lb

## 2015-06-03 DIAGNOSIS — I255 Ischemic cardiomyopathy: Secondary | ICD-10-CM | POA: Diagnosis not present

## 2015-06-03 DIAGNOSIS — I6523 Occlusion and stenosis of bilateral carotid arteries: Secondary | ICD-10-CM | POA: Insufficient documentation

## 2015-06-03 DIAGNOSIS — I872 Venous insufficiency (chronic) (peripheral): Secondary | ICD-10-CM | POA: Diagnosis not present

## 2015-06-03 DIAGNOSIS — G629 Polyneuropathy, unspecified: Secondary | ICD-10-CM | POA: Diagnosis not present

## 2015-06-03 DIAGNOSIS — Z8673 Personal history of transient ischemic attack (TIA), and cerebral infarction without residual deficits: Secondary | ICD-10-CM | POA: Insufficient documentation

## 2015-06-03 DIAGNOSIS — I4891 Unspecified atrial fibrillation: Secondary | ICD-10-CM | POA: Insufficient documentation

## 2015-06-03 DIAGNOSIS — Z9889 Other specified postprocedural states: Secondary | ICD-10-CM

## 2015-06-03 DIAGNOSIS — Z951 Presence of aortocoronary bypass graft: Secondary | ICD-10-CM | POA: Diagnosis not present

## 2015-06-03 DIAGNOSIS — I5022 Chronic systolic (congestive) heart failure: Secondary | ICD-10-CM

## 2015-06-03 DIAGNOSIS — G4733 Obstructive sleep apnea (adult) (pediatric): Secondary | ICD-10-CM | POA: Insufficient documentation

## 2015-06-03 DIAGNOSIS — I5032 Chronic diastolic (congestive) heart failure: Secondary | ICD-10-CM

## 2015-06-03 DIAGNOSIS — Z79899 Other long term (current) drug therapy: Secondary | ICD-10-CM | POA: Insufficient documentation

## 2015-06-03 DIAGNOSIS — I351 Nonrheumatic aortic (valve) insufficiency: Secondary | ICD-10-CM | POA: Insufficient documentation

## 2015-06-03 DIAGNOSIS — K219 Gastro-esophageal reflux disease without esophagitis: Secondary | ICD-10-CM | POA: Insufficient documentation

## 2015-06-03 DIAGNOSIS — I272 Other secondary pulmonary hypertension: Secondary | ICD-10-CM | POA: Diagnosis not present

## 2015-06-03 DIAGNOSIS — Z7982 Long term (current) use of aspirin: Secondary | ICD-10-CM | POA: Insufficient documentation

## 2015-06-03 DIAGNOSIS — E785 Hyperlipidemia, unspecified: Secondary | ICD-10-CM | POA: Insufficient documentation

## 2015-06-03 DIAGNOSIS — Z8249 Family history of ischemic heart disease and other diseases of the circulatory system: Secondary | ICD-10-CM | POA: Insufficient documentation

## 2015-06-03 DIAGNOSIS — I480 Paroxysmal atrial fibrillation: Secondary | ICD-10-CM

## 2015-06-03 DIAGNOSIS — I2510 Atherosclerotic heart disease of native coronary artery without angina pectoris: Secondary | ICD-10-CM | POA: Diagnosis not present

## 2015-06-03 DIAGNOSIS — I252 Old myocardial infarction: Secondary | ICD-10-CM | POA: Insufficient documentation

## 2015-06-03 LAB — BASIC METABOLIC PANEL
Anion gap: 9 (ref 5–15)
BUN: 21 mg/dL — AB (ref 6–20)
CALCIUM: 9.4 mg/dL (ref 8.9–10.3)
CO2: 28 mmol/L (ref 22–32)
CREATININE: 1.1 mg/dL (ref 0.61–1.24)
Chloride: 101 mmol/L (ref 101–111)
GFR calc non Af Amer: >60 mL/min (ref >60)
Glucose, Bld: 109 mg/dL — ABNORMAL HIGH (ref 65–99)
Potassium: 4.6 mmol/L (ref 3.5–5.1)
SODIUM: 138 mmol/L (ref 135–145)

## 2015-06-03 NOTE — Patient Instructions (Signed)
Routine lab work today. Will notify you of abnormal results, otherwise no news is good news!  Will schedule an appointment for you to follow up with [name] regarding your afib.  Follow up 2-3 weeks with [name].  Do the following things EVERYDAY: 1) Weigh yourself in the morning before breakfast. Write it down and keep it in a log. 2) Take your medicines as prescribed 3) Eat low salt foods-Limit salt (sodium) to 2000 mg per day.  4) Stay as active as you can everyday 5) Limit all fluids for the day to less than 2 liters

## 2015-06-03 NOTE — Progress Notes (Signed)
Patient ID: Caleb Moment, PhD, male   DOB: 1940-07-13, 75 y.o.   MRN: [id number] PCP: Dr. Sherren Mocha EP: [name]  75 yo with complex past history presents for heart failure evaluation.  Patient had anterior MI in 2004 and developed ischemic cardiomyopathy as well as mitral regurgitation. He also developed atrial fibrillation.  In 10/14, he had MV repair, Maze, CABG with LIMA-LAD, and LA appendage closure at [facility] in [place].  Subsequently, atrial fibrillation returned and he had an atrial fibrillation ablation in [facility] in 3/15.  He wore a Zio patch in 12/15 and had a low atrial fibrillation burden of 7%.  He has had a long-standing ischemic cardiomyopathy.  In 2015, EF was 20-25%.  Echo in 2016 also showed EF 25-30% but estimated PA pressure suggested severe pulmonary hypertension, which is new for him.    At initial appointment, he reported increased exertional dyspnea over a number of weeks.  RHC was done, showing mildly elevated PCWP with moderate pulmonary HTN and low cardiac index (1.93 thermo, 2.13 Fick).  V/Q scan showed no PE.  I started him on digoxin and have titrated his Lasix to 80 mg daily.  He is now on bisoprolol and able to tolerate it.  At last appointment, I had him try replacing valsartan with Entresto 24/26 bid.  He was unable to tolerate it (made him dizzy, though BP was not low).  Therefore, I had him restart valsartan.  CPX in 4/16 showed mildly decreased functional capacity.  He has tolerated Adcirca 20 mg daily.  He did not tolerate an attempt to uptitrate bisoprolol.  He tried CPAP but was unable to tolerate it.  Last echo in 1/17 showed EF 30-35%, stable MV repair, mild AI, but moderately dilated RV with mildly decreased systolic function and PA systolic pressure 69 mmHg.   Patient presents for an acute visit today.  He has felt himself to be in atrial fibrillation for about a week now.  It started during a "cold."  He feels worse in atrial fibrillation.  He is short of  breath with heavier exertion like walking up stairs or inclines.  More fatigued.  No chest pain.  He does feel palpitations.   6 minute walk (3/16): 381 m  6 minute walk (5/16): 414.5 m 6 minute walk (10/16): 562 m 6 minute walk (1/17): 469 m  ECG: atrial fibrillation, right superior axis, inferolateral T wave inversions, QRS 128 msec  Labs (2/15): LDL 144 Labs (8/15): K 4.6, creatinine 0.9 Labs (12/15): HCT 42.3   Labs (2/16): K 4 => 4.2, creatinine 1.05 => 0.92, BNP 268 Labs (3/16): BNP 495 => 320, digoxin 0.4, RF 14.7 (very mild increase), TSH normal, HIV negative, anti-SCL70 negative, creatinine 0.91, K 4.4 Labs (7/16): K 5, creatinine 1.05, BNP 455, vitamin D normal, digoxin 0.4, B12 normal Labs (8/16): digoxin 0.8, BNP 305, K 4.2, creatinine 0.99 Labs (9/16): HCT 43.3, TSH normal, BNP 492 => 278, K 4.3, creatinine 0.97 => 1.04, digoxin 0.6, TSH normal, LDL 154, LDL-P 1654.  Labs (10/16): K 4.7, creatinine 1.1 Labs (1/17): pro-BNP 2065, digoxin 0.3, K 4.3, creatinine 1.10 => 1.28  PMH: 1. CAD: Anterior MI in 2004.  Cardiac surgery in 10/14 included LIMA-LAD.  2. Chronic mitral regurgitation: 10/14 surgery at [facility] with MV repair, Maze, LIMA-LAD, and LA appendage closure.  3. Atrial fibrillation: Paroxysmal.  He was initially on Tikosyn but had breakthrough atrial fibrillation.  H/o Maze in 2014.  Had recurrent atrial fibrillation with  ablation in 3/15 by Dr Ola Spurr in [facility].  Not anticoagulated after 2 severe prostate bleeding episodes.  - Zio patch in 12/15 with low atrial fibrillation burden (7%).  - Holter (9/16) with PACs, PVCs, short atrial fibrillation runs (nothing sustained). 4. Chronotropic incompetence.  5. HTN 6. Hyperlipidemia: Refuses statin.  7. Peripheral neuropathy 8. GERD 9. H/o BPPV 10. H/o TIA 11. Ischemic cardiomyopathy: cardiac MRI 7/14 with EF 35%, moderate MR, normal RV size and systolic function, extensive anterior and anteroseptal LGE  suggestive of non-viable myocardium (this was prior to LIMA-LAD).  Echo 1/15 with EF 20-25%, moderate AI, PA systolic pressure 39 mmHg. Echo (1/16) with EF 20-25%, diffuse hypokinesis with regionality, moderate LV dilation, moderate AI, s/p MV repair with mild MR and normal gradients, RV dilated with mildly decreased systolic function, PA systolic pressure 71 mmHg.  RHC (2/16) with mean RA 6, PA 61/25 mean 40, mean PCWP 23, CI 2.13/PVR 4.3 (Fick), CI 1.93/PVR 4.7 (thermo).  CPX (4/16) with peak VO2 17.9, VE/VCO2 34.7 => mildly decreased functional capacity.  - Spironolactone apparently caused cognitive deficits - Intolerant of Coreg due to development of severe alopecia - Unable to uptitrate bisoprolol due to intolerance.  - Lightheaded with Entresto.  - Echo (1/17) with EF 30-35%, moderate LV dilation, mild AI, s/p MV repair with mild MR, moderately dilated RV with mildly decreased systolic function, PA systolic pressure 69 mmHg.  12. Carotid dopplers (1/16) with 40-59% bilateral ICA stenosis.  13. Aortic insufficiency: Moderate by last echo 2/16.  14. Pulmonary HTN: Mixed PAH and pulmonary venous hypertension.  PFTs (9/14) were normal.  V/Q scan (2/16) with no evidence of acute or chronic PE.  6 minute walk (3/16) 381 m. 6 minute walk (5/16) 414.5 m. 6 minute walk (1/17) 469 m.  15. OSA: Moderate on 5/16 sleep study. Unable to tolerate CPAP.  16. Venous insufficiency  SH: Married, lives in [place], Engineer, water, nonsmoker  FH: CAD  ROS: All systems reviewed and negative except as per HPI.   Current Outpatient Prescriptions  Medication Sig Dispense Refill  . aspirin 81 MG chewable tablet Chew 81 mg by mouth daily.    . bisoprolol (ZEBETA) 5 MG tablet Take 0.5 tablets (2.5 mg total) by mouth daily. 30 tablet 3  . digoxin (LANOXIN) 0.125 MG tablet TAKE 1 TABLET BY MOUTH ONCE DAILY 90 tablet 3  . eplerenone (INSPRA) 25 MG tablet TAKE 1 TABLET BY MOUTH ONCE DAILY 30 tablet 6  . furosemide  (LASIX) 40 MG tablet Take 2 tabs EVERY AM and 1 tab EVERY PM 90 tablet 6  . Magnesium 100 MG CAPS Take 1 capsule by mouth 2 (two) times daily.     . Omega-3 Fatty Acids (THE VERY FINEST FISH OIL) LIQD Take 2-3 g by mouth daily.     . potassium chloride (K-DUR) 10 MEQ tablet Take 10 mEq by mouth every other day.    . Tadalafil, PAH, (ADCIRCA) 20 MG TABS Take 1 tablet (20 mg total) by mouth daily. 30 tablet 6  . valsartan (DIOVAN) 40 MG tablet Take 1 tablet every morning and 2 tablets every evening. 90 tablet 3  . Zinc 100 MG TABS Take 1 tablet by mouth daily.      No current facility-administered medications for this encounter.   BP 126/62 mmHg  Pulse 79  Wt 154 lb 8 oz (70.081 kg)  SpO2 99% General: NAD Neck: No JVD, no thyromegaly or thyroid nodule.  Lungs: Clear to auscultation bilaterally with  normal respiratory effort. CV: Lateral PMI.  Heart regular S1/S2, no S3/S4, 2/6 HSM LLSB.  Trace ankle edema.  No carotid bruit.  Normal pedal pulses.  Abdomen: Soft, nontender, no hepatosplenomegaly, no distention.  Skin: Intact without lesions or rashes.  Neurologic: Alert and oriented x 3.  Psych: Normal affect. Extremities: No clubbing or cyanosis.  HEENT: Normal.   Assessment/Plan: 1. CAD: This seems stable, no chest pain. S/p LIMA-LAD.  He is on ASA 81.  He has decided not to take statins after reviewing the data.   2. S/p mitral valve repair: The MV repair looked stable on 1/17 echo with mild MR and no evidence for significant mitral stenosis.  3. Aortic insufficiency: Mild on 1/17 echo, improved from prior.  4. Chronic systolic CHF: Ischemic cardiomyopathy, EF 30-35% on 1/17 echo.  PA pressure remains elevated by doppler measurement and the RV is moderately dilated with mildly decreased systolic function.  Mildly decreased functional capacity on 4/16 CPX.  NYHA class II-III, feels worse in atrial fibrillation. He does not appear volume overloaded, so I suspect symptomatic worsening is  likely a function of atrial fibrillation.  - Continue Lasix, 80 qam/40 qpm.  BMET today.   - Continue digoxin, recent level ok.  - Continue bisoprolol 2.5 mg daily (unable to increase).   - Continue valsartan 40 qam/80 qpm.  He did not tolerate Entresto.    - Continue eplerenone.      - He does not want an ICD and understands the reasoning for having one.  - In the future, he could be an LVAD candidate if he worsens.  5. Atrial fibrillation: s/p Maze in 10/14, then ablation in 3/15.  Prior to Maze, he was on Tikosyn but had breakthrough.  Currently, he is not anticoagulated due to history of prostate bleeding.  He is back in atrial fibrillation, suspect persistent for > 1 week. - I told him that the two options here would be antiarrhythmic (Tikosyn versus amiodarone) + anticoagulation + DCCV attempt versus ongoing medical management.  He is not interested in trying a redo ablation.  Given symptomatic worsening, I recommended anticoagulant + anti-arrhythmic + DCCV.  He wants to think about this.  I will see him back in 2 wks to decide what to do.  If he uses an anticoagulant, would aim for Eliquis 5 mg bid (he had prostate bleeding on coumadin) even though this would be technically off label with his mitral valve disease.  I would like him to see Dr Caryl Comes to discuss management as well prior to returning to see me.  - Continue bisoprolol.  6. Pulmonary hypertension: Moderate to severe by most recent echo in 1/17, moderate by last RHC.  The RV was moderately dilated and mildly dysfunction on most recent echo.  Mixed pulmonary venous and pulmonary arterial HTN.  It is possible that the [facility] component is due to pulmonary vascular remodeling in the setting of chronic mitral regurgitation prior to MV repair. Negative V/Q scan, no evidence for CTEPH.  PFTs normal in 9/14.  He is seeing Dr Lake Bells.  Sleep study showed moderate OSA but he has been unable to tolerated CPAP.  He has started on Adcirca and felt like it  helped.   - I recommended that he increase Adcirca to 40 mg daily.  He wants to keep dose at 20 mg daily for now.  7. OSA: Moderate.  Has not tolerated CPAP. Probably plays a role in recurrent atrial fibrillation.  Will check in with  Dr Radford Pax to see if we can get him an oral appliance and to see if he qualifies for oxygen at night.    Loralie Champagne 06/03/2015

## 2015-06-04 MED FILL — EPLERENONE 25 MG TABLET: 25 | 30 days supply | Qty: 30 | Fill #3

## 2015-06-05 ENCOUNTER — Telehealth: Payer: Self-pay | Admitting: Cardiology

## 2015-06-05 MED FILL — FUROSEMIDE 40 MG TABLET: 40 | 30 days supply | Qty: 90 | Fill #2

## 2015-06-05 NOTE — Telephone Encounter (Signed)
New Message:  Pt wants to know if you have heard anything from his dentist-Dr Toy Cookey? This is concerning his appliance for his sleep aspena

## 2015-06-05 NOTE — Telephone Encounter (Signed)
Patient requested earlier appointment to speak with Dr. Radford Pax about her opinions on oral appliance since he is unable to tolerate CPAP.  Scheduled patient 3/28 at 1430. Patient was grateful for assistance.

## 2015-06-06 ENCOUNTER — Encounter: Payer: Self-pay | Admitting: Internal Medicine

## 2015-06-06 ENCOUNTER — Ambulatory Visit (INDEPENDENT_AMBULATORY_CARE_PROVIDER_SITE_OTHER): Payer: Medicare Other | Admitting: Internal Medicine

## 2015-06-06 VITALS — BP 126/62 | HR 61 | Ht 70.0 in | Wt 154.6 lb

## 2015-06-06 DIAGNOSIS — I480 Paroxysmal atrial fibrillation: Secondary | ICD-10-CM

## 2015-06-06 NOTE — Progress Notes (Signed)
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 He has a history of ischemic heart disease dating back to 2004 when he presented with an anterior wall MI. He had progressive mitral regurgitation and a dilated aortic root and he went to [place] underwent mitral valve repair aortic root surgery and a CryoMaze He also had a single vessel bypass with a LIMA to his LAD Echocardiogram 1/15 it demonstrated ejection fraction 20-25% with moderate AR mild MR with a prosthestic ring.    Subsequently, atrial fibrillation returned and he had an atrial fibrillation ablation in [facility] in 3/15. He wore a Zio patch in 12/15 and had a low atrial fibrillation burden of 7%. He has had a long-standing ischemic cardiomyopathy. In 2015, EF was 20-25%. Echo in 2016 also showed EF 25-30% but estimated PA pressure suggested severe pulmonary hypertension, which is new for him.    He has been following up in the heart failure clinic. Right heart catheterization about a year ago demonstrated moderate pulmonary hypertension and a low cardiac index about 2. Digoxin was started. Entresto was not tolerated. CPAP was not tolerated. Most recent echo 1/17 demonstrated some interval improvement in LVEF at 30-35 with again moderate pulmonary hypertension 69 mm with a moderately dilated RV   He was seen earlier this week and found to be in recurrent atrial fibrillation; he has been managed more recently just on aspirin (not quite sure why) and with the re-detection of atrial fibrillation NOAC was recommended  He has a regular sinus rhythm. His atrial fibrillation as worsened n the context of a concurrent viral illness which was improveing and then has worsened   No significant fever  Past Medical History  Diagnosis Date    . CAD (coronary artery disease)   . Atrial fibrillation ([place])   . Cardiomyopathy, ischemic   . Heart failure, systolic, acute on chronic ([place])   . Hypertension   . Hyperlipidemia   . Pleural effusion   . Positional vertigo   . Dizziness   . GERD (gastroesophageal reflux disease)   . Atypical pneumonia   . Cough   . Allergic rhinitis   . TIA (transient ischemic attack)   . OSA (obstructive sleep apnea)     Home sleep test 07/05/2009 AHI 8.2  . Chronic anticoagulation     Past Surgical History  Procedure Laterality Date  . Coronary stent placement  2004    LAD  . Shoulder surgery    . Tee without cardioversion N/A 09/21/2012    Procedure: TRANSESOPHAGEAL ECHOCARDIOGRAM (TEE);  Surgeon: Larey Dresser, MD;  Location: [facility] ENDOSCOPY;  Service: Cardiovascular;  Laterality: N/A;  . Coronary artery bypass graft  01/09/13    LAD LIMA, left atrial appendage  . Mitral valve annuloplasty  01/09/13  . Aortic valve repair  01/09/13  . Right heart catheterization N/A 05/11/2014    Procedure: RIGHT HEART CATH;  Surgeon: Larey Dresser, MD;  Location: [facility] CATH LAB;  Service: Cardiovascular;  Laterality: N/A;    Current Outpatient Prescriptions  Medication Sig Dispense Refill  . aspirin 81 MG chewable tablet Chew 81 mg by mouth daily.    . bisoprolol (ZEBETA) 5 MG tablet Take 0.5 tablets (2.5 mg total) by mouth daily. 30 tablet 3  .  digoxin (LANOXIN) 0.125 MG tablet TAKE 1 TABLET BY MOUTH ONCE DAILY 90 tablet 3  . eplerenone (INSPRA) 25 MG tablet TAKE 1 TABLET BY MOUTH ONCE DAILY 30 tablet 6  . furosemide (LASIX) 40 MG tablet Take 2 tabs EVERY AM and 1 tab EVERY PM 90 tablet 6  . Magnesium 100 MG CAPS Take 1 capsule by mouth 3 (three) times daily.     . Omega-3 Fatty Acids (THE VERY FINEST FISH OIL) LIQD Take 2-3 g by mouth daily.     . potassium chloride (K-DUR) 10 MEQ tablet Take 10 mEq by mouth every other day.    . Tadalafil, PAH, (ADCIRCA) 20 MG TABS Take 1 tablet (20 mg total) by mouth  daily. 30 tablet 6  . valsartan (DIOVAN) 40 MG tablet Take 1 tablet every morning and 2 tablets every evening. 90 tablet 3  . Zinc 100 MG TABS Take 1 tablet by mouth daily.      No current facility-administered medications for this visit.    Allergies  Allergen Reactions  . Carvedilol Other (See Comments)    Dizziness, uneasiness, alopecia  . Dabigatran Rash  . Pradaxa [Dabigatran Etexilate Mesylate] Rash  . Sulfonamide Derivatives Rash    REACTION: rash  . Xarelto [Rivaroxaban] Rash    Review of Systems negative except from HPI and PMH  Physical Exam BP 126/62 mmHg  Pulse 61  Ht _0  (1.778 m)  Wt 154 lb 9.6 oz (70.126 kg)  BMI 22.18 kg/m2 Well developed and well nourished in no acute distress HENT normal E scleral and icterus clear Neck Supple JVP flat; carotids brisk and full Clear to ausculation  Regular rate and rhythm, no murmurs gallops or rub Soft with active bowel sounds No clubbing cyanosis left 1+ right trace edema Edema Alert and oriented, grossly normal motor and sensory function Skin Warm and Dry   ECG demonstratedNSR    Assessment and  Plan  Palpitations  MV repair Cryo Maze  RFCA   Chronic systolic heart failure   Ischemic/nonischemic cardiomyopathy with prior bypass    He'll return to sinus rhythm. We again discussed the issue of anticoagulation. His CHADS-VASc score is 6+. He had oversewing of his atrial appendage at the time of his mitral valve surgery  At this point he is not interested antiarrhythmic therapy. As that was the resolution of his cold that his atrial fibrillation returned to its baseline.  Euvolemic continue current meds  The issue of an ICD was NOT broached  Will see again prn

## 2015-06-06 NOTE — Patient Instructions (Signed)
Medication Instructions:  Your physician recommends that you continue on your current medications as directed. Please refer to the Current Medication list given to you today.  Labwork: None ordered  Testing/Procedures: None ordered  Follow-Up: No follow up is needed at this time with [name].  He will see you on an as needed basis.  Thank you for choosing [facility]!!      '

## 2015-06-07 ENCOUNTER — Encounter (HOSPITAL_COMMUNITY): Payer: Medicare Other

## 2015-06-10 ENCOUNTER — Ambulatory Visit (HOSPITAL_COMMUNITY)
Admission: RE | Admit: 2015-06-10 | Discharge: 2015-06-10 | Disposition: A | Discharge status: Home / Self Care | Payer: Medicare Other | Source: Ambulatory Visit | Attending: Cardiology | Admitting: Cardiology

## 2015-06-10 VITALS — BP 104/44 | HR 57 | Wt 154.5 lb

## 2015-06-10 DIAGNOSIS — I5022 Chronic systolic (congestive) heart failure: Secondary | ICD-10-CM | POA: Insufficient documentation

## 2015-06-10 DIAGNOSIS — R059 Cough, unspecified: Secondary | ICD-10-CM

## 2015-06-10 DIAGNOSIS — R066 Hiccough: Secondary | ICD-10-CM | POA: Insufficient documentation

## 2015-06-10 DIAGNOSIS — G629 Polyneuropathy, unspecified: Secondary | ICD-10-CM | POA: Diagnosis not present

## 2015-06-10 DIAGNOSIS — E785 Hyperlipidemia, unspecified: Secondary | ICD-10-CM | POA: Insufficient documentation

## 2015-06-10 DIAGNOSIS — R05 Cough: Secondary | ICD-10-CM | POA: Diagnosis not present

## 2015-06-10 DIAGNOSIS — Z7982 Long term (current) use of aspirin: Secondary | ICD-10-CM | POA: Diagnosis not present

## 2015-06-10 DIAGNOSIS — I2510 Atherosclerotic heart disease of native coronary artery without angina pectoris: Secondary | ICD-10-CM | POA: Diagnosis not present

## 2015-06-10 DIAGNOSIS — I080 Rheumatic disorders of both mitral and aortic valves: Secondary | ICD-10-CM | POA: Diagnosis not present

## 2015-06-10 DIAGNOSIS — I110 Hypertensive heart disease with heart failure: Secondary | ICD-10-CM | POA: Diagnosis not present

## 2015-06-10 DIAGNOSIS — Z8249 Family history of ischemic heart disease and other diseases of the circulatory system: Secondary | ICD-10-CM | POA: Diagnosis not present

## 2015-06-10 DIAGNOSIS — I255 Ischemic cardiomyopathy: Secondary | ICD-10-CM | POA: Insufficient documentation

## 2015-06-10 DIAGNOSIS — I38 Endocarditis, valve unspecified: Secondary | ICD-10-CM | POA: Diagnosis not present

## 2015-06-10 DIAGNOSIS — I252 Old myocardial infarction: Secondary | ICD-10-CM | POA: Diagnosis not present

## 2015-06-10 DIAGNOSIS — Z8673 Personal history of transient ischemic attack (TIA), and cerebral infarction without residual deficits: Secondary | ICD-10-CM | POA: Insufficient documentation

## 2015-06-10 DIAGNOSIS — Z9889 Other specified postprocedural states: Secondary | ICD-10-CM

## 2015-06-10 DIAGNOSIS — Z79899 Other long term (current) drug therapy: Secondary | ICD-10-CM | POA: Insufficient documentation

## 2015-06-10 DIAGNOSIS — G4733 Obstructive sleep apnea (adult) (pediatric): Secondary | ICD-10-CM | POA: Diagnosis not present

## 2015-06-10 DIAGNOSIS — I6523 Occlusion and stenosis of bilateral carotid arteries: Secondary | ICD-10-CM | POA: Insufficient documentation

## 2015-06-10 DIAGNOSIS — I4891 Unspecified atrial fibrillation: Secondary | ICD-10-CM | POA: Diagnosis not present

## 2015-06-10 DIAGNOSIS — Z951 Presence of aortocoronary bypass graft: Secondary | ICD-10-CM | POA: Diagnosis not present

## 2015-06-10 DIAGNOSIS — K219 Gastro-esophageal reflux disease without esophagitis: Secondary | ICD-10-CM | POA: Insufficient documentation

## 2015-06-10 DIAGNOSIS — I272 Other secondary pulmonary hypertension: Secondary | ICD-10-CM | POA: Insufficient documentation

## 2015-06-10 LAB — CBC
HCT: 38.3 % — ABNORMAL LOW (ref 39.0–52.0)
HEMOGLOBIN: 13 g/dL (ref 13.0–17.0)
MCH: 30.8 pg (ref 26.0–34.0)
MCHC: 33.9 g/dL (ref 30.0–36.0)
MCV: 90.8 fL (ref 78.0–100.0)
PLATELETS: 123 10*3/uL — AB (ref 150–400)
RBC: 4.22 MIL/uL (ref 4.22–5.81)
RDW: 14.4 % (ref 11.5–15.5)
WBC: 2.4 10*3/uL — ABNORMAL LOW (ref 4.0–10.5)

## 2015-06-10 LAB — BASIC METABOLIC PANEL
ANION GAP: 11 (ref 5–15)
BUN: 20 mg/dL (ref 6–20)
CALCIUM: 8.6 mg/dL — AB (ref 8.9–10.3)
CO2: 24 mmol/L (ref 22–32)
CREATININE: 1.09 mg/dL (ref 0.61–1.24)
Chloride: 97 mmol/L — ABNORMAL LOW (ref 101–111)
GFR calc Af Amer: >60 mL/min (ref >60)
GFR calc non Af Amer: >60 mL/min (ref >60)
Glucose, Bld: 94 mg/dL (ref 65–99)
Potassium: 4.1 mmol/L (ref 3.5–5.1)
SODIUM: 132 mmol/L — AB (ref 135–145)

## 2015-06-10 MED ORDER — CHLORPROMAZINE HCL 25 MG PO TABS: 25.0000 mg | ORAL_TABLET | Freq: Three times a day (TID) | ORAL | Status: DC

## 2015-06-10 MED FILL — CHLORPROMAZINE 25 MG TABLET: 25 | 18 days supply | Qty: 55 | Fill #0

## 2015-06-10 NOTE — Patient Instructions (Signed)
START Thorazine 25 mg tablet three times daily as needed for hiccups.  Routine lab work today. Will notify you of abnormal results, otherwise no news is good news!  Will notify you of chest xray results.  Do the following things EVERYDAY: 1) Weigh yourself in the morning before breakfast. Write it down and keep it in a log. 2) Take your medicines as prescribed 3) Eat low salt foods-Limit salt (sodium) to 2000 mg per day.  4) Stay as active as you can everyday 5) Limit all fluids for the day to less than 2 liters

## 2015-06-10 NOTE — Progress Notes (Signed)
Patient ID: Caleb Moment, PhD, male   DOB: 1940/11/13, 75 y.o.   MRN: [id number]    Advanced Heart Failure Clinic Note   PCP: Dr. Sherren Mocha EP: [name]  75 yo with complex past history presents for heart failure evaluation.  Patient had anterior MI in 2004 and developed ischemic cardiomyopathy as well as mitral regurgitation. He also developed atrial fibrillation.  In 10/14, he had MV repair, Maze, CABG with LIMA-LAD, and LA appendage closure at [facility] in [place].  Subsequently, atrial fibrillation returned and he had an atrial fibrillation ablation in [facility] in 3/15.  He wore a Zio patch in 12/15 and had a low atrial fibrillation burden of 7%.  He has had a long-standing ischemic cardiomyopathy.  In 2015, EF was 20-25%.  Echo in 2016 also showed EF 25-30% but estimated PA pressure suggested severe pulmonary hypertension, which is new for him.    At initial appointment, he reported increased exertional dyspnea over a number of weeks.  RHC was done, showing mildly elevated PCWP with moderate pulmonary HTN and low cardiac index (1.93 thermo, 2.13 Fick).  V/Q scan showed no PE.  I started him on digoxin and have titrated his Lasix to 80 mg daily.  He is now on bisoprolol and able to tolerate it.  At last appointment, I had him try replacing valsartan with Entresto 24/26 bid.  He was unable to tolerate it (made him dizzy, though BP was not low).  Therefore, I had him restart valsartan.  CPX in 4/16 showed mildly decreased functional capacity.  He has tolerated Adcirca 20 mg daily.  He did not tolerate an attempt to uptitrate bisoprolol.  He tried CPAP but was unable to tolerate it.  Last echo in 1/17 showed EF 30-35%, stable MV repair, mild AI, but moderately dilated RV with mildly decreased systolic function and PA systolic pressure 69 mmHg.   At last appointment, he reported more exertional fatigue and dyspnea.  He was noted to be in atrial fibrillation with controlled rate.   Patient presents  for an acute visit today with flu-like symptoms and intractable hiccups x 4-5 days. He saw Dr Caryl Comes 06/06/15. ICD was NOT discussed.  No decision on anticoagulation with resolution of Afib that day. Cold felt to have set off a-fib. Has been feeling bad x 5 days. With flu like symptoms including dry hacking cough, body aches, sore throat, post nasal drop, and malaise. Denies subjective fevers, but wife has had Temp of ~102. Has had nausea but no vomiting, and is having incontinence with soft stools.  Over the same period he has had intractable hiccups that have not gone away. Has been mildly more SOB, but has increased over the past few days, thinks mostly related to fatigue.   6 minute walk (3/16): 381 m  6 minute walk (5/16): 414.5 m 6 minute walk (10/16): 562 m 6 minute walk (1/17): 469 m  Labs (2/15): LDL 144 Labs (8/15): K 4.6, creatinine 0.9 Labs (12/15): HCT 42.3   Labs (2/16): K 4 => 4.2, creatinine 1.05 => 0.92, BNP 268 Labs (3/16): BNP 495 => 320, digoxin 0.4, RF 14.7 (very mild increase), TSH normal, HIV negative, anti-SCL70 negative, creatinine 0.91, K 4.4 Labs (7/16): K 5, creatinine 1.05, BNP 455, vitamin D normal, digoxin 0.4, B12 normal Labs (8/16): digoxin 0.8, BNP 305, K 4.2, creatinine 0.99 Labs (9/16): HCT 43.3, TSH normal, BNP 492 => 278, K 4.3, creatinine 0.97 => 1.04, digoxin 0.6, TSH normal, LDL 154,  LDL-P 1654.  Labs (10/16): K 4.7, creatinine 1.1 Labs (1/17): pro-BNP 2065, digoxin 0.3, K 4.3, creatinine 1.10 => 1.28  PMH: 1. CAD: Anterior MI in 2004.  Cardiac surgery in 10/14 included LIMA-LAD.  2. Chronic mitral regurgitation: 10/14 surgery at [facility] with MV repair, Maze, LIMA-LAD, and LA appendage closure.  3. Atrial fibrillation: Paroxysmal.  He was initially on Tikosyn but had breakthrough atrial fibrillation.  H/o Maze in 2014.  Had recurrent atrial fibrillation with ablation in 3/15 by Dr Ola Spurr in [facility].  Not anticoagulated after 2 severe prostate  bleeding episodes.  - Zio patch in 12/15 with low atrial fibrillation burden (7%).  - Holter (9/16) with PACs, PVCs, short atrial fibrillation runs (nothing sustained). 4. Chronotropic incompetence.  5. HTN 6. Hyperlipidemia: Refuses statin.  7. Peripheral neuropathy 8. GERD 9. H/o BPPV 10. H/o TIA 11. Ischemic cardiomyopathy: cardiac MRI 7/14 with EF 35%, moderate MR, normal RV size and systolic function, extensive anterior and anteroseptal LGE suggestive of non-viable myocardium (this was prior to LIMA-LAD).  Echo 1/15 with EF 20-25%, moderate AI, PA systolic pressure 39 mmHg. Echo (1/16) with EF 20-25%, diffuse hypokinesis with regionality, moderate LV dilation, moderate AI, s/p MV repair with mild MR and normal gradients, RV dilated with mildly decreased systolic function, PA systolic pressure 71 mmHg.  RHC (2/16) with mean RA 6, PA 61/25 mean 40, mean PCWP 23, CI 2.13/PVR 4.3 (Fick), CI 1.93/PVR 4.7 (thermo).  CPX (4/16) with peak VO2 17.9, VE/VCO2 34.7 => mildly decreased functional capacity.  - Spironolactone apparently caused cognitive deficits - Intolerant of Coreg due to development of severe alopecia - Unable to uptitrate bisoprolol due to intolerance.  - Lightheaded with Entresto.  - Echo (1/17) with EF 30-35%, moderate LV dilation, mild AI, s/p MV repair with mild MR, moderately dilated RV with mildly decreased systolic function, PA systolic pressure 69 mmHg.  12. Carotid dopplers (1/16) with 40-59% bilateral ICA stenosis.  13. Aortic insufficiency: Moderate by last echo 2/16.  14. Pulmonary HTN: Mixed PAH and pulmonary venous hypertension.  PFTs (9/14) were normal.  V/Q scan (2/16) with no evidence of acute or chronic PE.  6 minute walk (3/16) 381 m. 6 minute walk (5/16) 414.5 m. 6 minute walk (1/17) 469 m.  15. OSA: Moderate on 5/16 sleep study. Unable to tolerate CPAP.  16. Venous insufficiency  SH: Married, lives in [place], Engineer, water, nonsmoker  FH: CAD  ROS: All  systems reviewed and negative except as per HPI.   Current Outpatient Prescriptions  Medication Sig Dispense Refill  . aspirin 81 MG chewable tablet Chew 81 mg by mouth daily.    . bisoprolol (ZEBETA) 5 MG tablet Take 0.5 tablets (2.5 mg total) by mouth daily. 30 tablet 3  . digoxin (LANOXIN) 0.125 MG tablet TAKE 1 TABLET BY MOUTH ONCE DAILY 90 tablet 3  . eplerenone (INSPRA) 25 MG tablet TAKE 1 TABLET BY MOUTH ONCE DAILY 30 tablet 6  . furosemide (LASIX) 40 MG tablet Take 2 tabs EVERY AM and 1 tab EVERY PM 90 tablet 6  . Magnesium 100 MG CAPS Take 1 capsule by mouth 3 (three) times daily.     . Omega-3 Fatty Acids (THE VERY FINEST FISH OIL) LIQD Take 2-3 g by mouth daily.     . potassium chloride (K-DUR) 10 MEQ tablet Take 10 mEq by mouth every other day.    . Tadalafil, PAH, (ADCIRCA) 20 MG TABS Take 1 tablet (20 mg total) by mouth daily. 30 tablet  6  . valsartan (DIOVAN) 40 MG tablet Take 1 tablet every morning and 2 tablets every evening. 90 tablet 3  . Zinc 100 MG TABS Take 1 tablet by mouth daily.      No current facility-administered medications for this encounter.   BP 104/44 mmHg  Pulse 57  Wt 154 lb 8 oz (70.081 kg)  SpO2 97%   Wt Readings from Last 3 Encounters:  06/10/15 154 lb 8 oz (70.081 kg)  06/06/15 154 lb 9.6 oz (70.126 kg)  06/03/15 154 lb 8 oz (70.081 kg)     General: NAD Neck: No JVD, no thyromegaly or thyroid nodule.  Lungs: Clear to auscultation bilaterally with normal respiratory effort. CV: Lateral PMI.  Heart regular S1/S2, no S3/S4, 2/6 HSM LLSB.  Trace ankle edema.  No carotid bruit.  Normal pedal pulses.  Abdomen: Soft, NT, ND, no HSM. No bruits or masses. +BS  Neurologic: Alert and oriented x 3.  Psych: Normal affect. Extremities: No clubbing or cyanosis.  HEENT: Normal.   Assessment/Plan: 1. CAD: This seems stable, no chest pain. S/p LIMA-LAD.  He is on ASA 81.  He has decided not to take statins after reviewing the data.   2. S/p mitral valve  repair: The MV repair looked stable on 1/17 echo with mild MR and no evidence for significant mitral stenosis.  3. Aortic insufficiency: Mild on 1/17 echo, improved from prior.  4. Chronic systolic CHF: Ischemic cardiomyopathy, EF 30-35% on 1/17 echo.  PA pressure remains elevated by doppler measurement and the RV is moderately dilated with mildly decreased systolic function.  Mildly decreased functional capacity on 4/16 CPX.  NYHA class II-III, feels worse in atrial fibrillation. He does not appear volume overloaded today.  - Continue Lasix 80 qam/40 qpm but would hold the pm dose for a couple of days until he is feeling better (less po intake than normal currently).   - Continue digoxin, recent level ok.  - Continue bisoprolol 2.5 mg daily (unable to increase).   - Continue valsartan 40 qam/80 qpm.  He did not tolerate Entresto.    - Continue eplerenone.      - He does not want an ICD and understands the reasoning for having one.  - In the future, he could be an LVAD candidate if he worsens.  5. Atrial fibrillation: s/p Maze in 10/14, then ablation in 3/15.  Prior to Maze, he was on Tikosyn but had breakthrough.  Currently, he is not anticoagulated due to history of prostate bleeding.  At last appointment, he had been in atrial fibrillation for about a week (seemed to be temporally related to a URI).  However, when he saw Dr Caryl Comes last week and at this appointment today, he has been back in NSR.   - He remains reluctant to be anticoagulated. - Continue bisoprolol.  6. Pulmonary hypertension: Moderate to severe by most recent echo in 1/17, moderate by last RHC.  The RV was moderately dilated and mildly dysfunction on most recent echo.  Mixed pulmonary venous and pulmonary arterial HTN.  It is possible that the [facility] component is due to pulmonary vascular remodeling in the setting of chronic mitral regurgitation prior to MV repair. Negative V/Q scan, no evidence for CTEPH.  PFTs normal in 9/14.  He is  seeing Dr Lake Bells.  Sleep study showed moderate OSA but he has been unable to tolerated CPAP.  He has started on Adcirca and felt like it helped.   - Have recommended he  increase Adcirca to 40 mg daily.  He wants to keep dose at 20 mg daily for now.  7. OSA: Moderate.  Has not tolerated CPAP. Probably plays a role in recurrent atrial fibrillation.  Working on getting mouthpiece.  8. Flu-like symptoms: Possible influenza.  However, he has had symptoms for > 4 days now so Tamiflu is unlikely to be helpful.  Will get BMET, CBC, and CXR. 9. Intractable hiccups: He has been unable to sleep for several days and feels awful.  I will give him a thorazine prescription for prn use.   Shirley Friar PA-C 06/10/2015   Patient seen with PA, agree with the above note.  Patient is seen today with flu-like illness, influenza is certainly a possibility.  CXR was done and is clear.  He has had symptoms for > 4 days, so would not treat with Tamiflu.  He has had intractable hiccups and has been unable to sleep for several nights.  I will give him a thorazine prescription.  With decreased po intake, I will have him hold pm Lasix dose for a few days.   Loralie Champagne 06/10/2015

## 2015-06-11 MED FILL — ADCIRCA 20 MG TABLET: 20 | 30 days supply | Qty: 30 | Fill #2

## 2015-06-17 NOTE — Progress Notes (Signed)
Cardiology Office Note   Date:  06/18/2015   ID:  Caleb Moment, PhD, DOB [birth year], MRN [id number]  PCP:  Joycelyn Man, MD    Chief Complaint  Patient presents with  . Sleep Apnea  . Hypertension      History of Present Illness: Caleb Moment, PhD is a 75 y.o. male with a history HTN, OSA and obesity who presents today for followup.  He has a history of snoring, excessive daytime sleepiness and pulmonary HTN as well as PAF. PSG showed moderate OSA with an AHI of 18.5/hr with most events occurring in the nonsupine position. He had oxygen desaturations as low as 86%. CPAP therapy was recommended but patient did not tolerate the CPAP and sent his machine back.  He is now here to discuss other therapy.      Past Medical History  Diagnosis Date  . CAD (coronary artery disease)   . Atrial fibrillation ([place])   . Cardiomyopathy, ischemic   . Heart failure, systolic, acute on chronic ([place])   . Hypertension   . Hyperlipidemia   . Pleural effusion   . Positional vertigo   . Dizziness   . GERD (gastroesophageal reflux disease)   . Atypical pneumonia   . Cough   . Allergic rhinitis   . TIA (transient ischemic attack)   . OSA (obstructive sleep apnea)     Home sleep test 07/05/2009 AHI 8.2  . Chronic anticoagulation     Past Surgical History  Procedure Laterality Date  . Coronary stent placement  2004    LAD  . Shoulder surgery    . Tee without cardioversion N/A 09/21/2012    Procedure: TRANSESOPHAGEAL ECHOCARDIOGRAM (TEE);  Surgeon: Larey Dresser, MD;  Location: [facility] ENDOSCOPY;  Service: Cardiovascular;  Laterality: N/A;  . Coronary artery bypass graft  01/09/13    LAD LIMA, left atrial appendage  . Mitral valve annuloplasty  01/09/13  . Aortic valve repair  01/09/13  . Right heart catheterization N/A 05/11/2014    Procedure: RIGHT HEART CATH;  Surgeon: Larey Dresser, MD;  Location: [facility] CATH LAB;  Service: Cardiovascular;  Laterality: N/A;      Current Outpatient Prescriptions  Medication Sig Dispense Refill  . aspirin 81 MG chewable tablet Chew 81 mg by mouth daily.    . bisoprolol (ZEBETA) 5 MG tablet Take 0.5 tablets (2.5 mg total) by mouth daily. 30 tablet 3  . chlorproMAZINE (THORAZINE) 25 MG tablet Take 1 tablet (25 mg total) by mouth 3 (three) times daily. 90 tablet 6  . digoxin (LANOXIN) 0.125 MG tablet TAKE 1 TABLET BY MOUTH ONCE DAILY 90 tablet 3  . eplerenone (INSPRA) 25 MG tablet TAKE 1 TABLET BY MOUTH ONCE DAILY 30 tablet 6  . furosemide (LASIX) 40 MG tablet Take 2 tabs EVERY AM and 1 tab EVERY PM 90 tablet 6  . Magnesium 100 MG CAPS Take 1 capsule by mouth 3 (three) times daily.     . Omega-3 Fatty Acids (THE VERY FINEST FISH OIL) LIQD Take 2-3 g by mouth daily.     . potassium chloride (K-DUR) 10 MEQ tablet Take 10 mEq by mouth every other day.    . Tadalafil, PAH, (ADCIRCA) 20 MG TABS Take 1 tablet (20 mg total) by mouth daily. 30 tablet 6  . valsartan (DIOVAN) 40 MG tablet Take 1 tablet every morning and 2 tablets every evening. [place]  tablet 3  . Zinc 100 MG TABS Take 1 tablet by mouth daily.      No current facility-administered medications for this visit.    Allergies:   Carvedilol; Dabigatran; Pradaxa; Sulfonamide derivatives; and Xarelto    Social History:  The patient  reports that he has never smoked. He has never used smokeless tobacco. He reports that he does not drink alcohol or use illicit drugs.   Family History:  The patient's family history is negative for Heart disease and Hypertension. He was adopted.    ROS:  Please see the history of present illness.   Otherwise, review of systems are positive for none.   All other systems are reviewed and negative.    PHYSICAL EXAM: VS:  BP 114/52 mmHg  Pulse 54  Ht _0  (1.778 m)  Wt 149 lb (67.586 kg)  BMI 21.38 kg/m2 , BMI Body mass index is 21.38 kg/(m^2). GEN: Well nourished, well developed, in no acute distress HEENT: normal Neck: no JVD,  carotid bruits, or masses Cardiac: RRR; no murmurs, rubs, or gallops,no edema  Respiratory:  clear to auscultation bilaterally, normal work of breathing GI: soft, nontender, nondistended, + BS MS: no deformity or atrophy Skin: warm and dry, no rash Neuro:  Strength and sensation are intact Psych: euthymic mood, full affect   EKG:  EKG is not ordered today.    Recent Labs: 12/05/2014: TSH 2.043 03/26/2015: Pro B Natriuretic peptide (BNP) 2065.00* 04/01/2015: B Natriuretic Peptide 243.7*; Magnesium 2.2 06/10/2015: BUN 20; Creatinine, Ser 1.09; Hemoglobin 13.0; Platelets 123*; Potassium 4.1; Sodium 132*    Lipid Panel    Component Value Date/Time   CHOL 225* 05/11/2013 1003   TRIG 39.0 05/11/2013 1003   HDL 72.60 05/11/2013 1003   CHOLHDL 3 05/11/2013 1003   VLDL 7.8 05/11/2013 1003   LDLCALC 122* 04/19/2008 0927   LDLDIRECT 143.9 05/11/2013 1003      Wt Readings from Last 3 Encounters:  06/18/15 149 lb (67.586 kg)  06/10/15 154 lb 8 oz (70.081 kg)  06/06/15 154 lb 9.6 oz (70.126 kg)    ASSESSMENT AND PLAN:  1. Moderate OSA with an AHI of 18/hr and oxygen desaturations as low as 86%. I have discussed the results of the sleep study with him. In light of his significant pulmonary HTN, PAF and ischemic DCM I  Recommended CPAP but patient did not tolerate it.   I have discussed with him and his wife that CPAP/ BiPAP is the treatment of choice but he does not tolerate PAP therapy.  Therefore I will refer him to Dr. Toy Cookey with dentistry for fitting with an adjustable oral device.  He will need a HST and overnight pulse oximetry once he gets the device to make sure he does not have residual oxygen desaturations that would need overnight O2 to treat. 2. Severe pulmonary HTN most likely multifactorial from Pulmonary venous HTN, Pulmonary arterial HTN and OSA with chronic hypoxia. This is followed by [name] and Dr. Lake Bells.  3. HTN - controlled on BB and ARB therapy.      Current medicines are reviewed at length with the patient today.  The patient does not have concerns regarding medicines.  The following changes have been made:  no change  Labs/ tests ordered today: See above Assessment and Plan No orders of the defined types were placed in this encounter.     Disposition:   FU with me in 6 months  Signed, Sueanne Margarita, MD  06/18/2015 2:35  PM    [facility] [address], [place], [place]  [address] Phone: [phone]; Fax: [phone]

## 2015-06-18 ENCOUNTER — Ambulatory Visit (INDEPENDENT_AMBULATORY_CARE_PROVIDER_SITE_OTHER): Payer: Medicare Other | Admitting: Cardiology

## 2015-06-18 ENCOUNTER — Encounter: Payer: Self-pay | Admitting: Cardiology

## 2015-06-18 VITALS — BP 114/52 | HR 54 | Ht 70.0 in | Wt 149.0 lb

## 2015-06-18 DIAGNOSIS — G4733 Obstructive sleep apnea (adult) (pediatric): Secondary | ICD-10-CM

## 2015-06-18 DIAGNOSIS — I10 Essential (primary) hypertension: Secondary | ICD-10-CM | POA: Diagnosis not present

## 2015-06-18 NOTE — Patient Instructions (Signed)
Medication Instructions:  Your physician recommends that you continue on your current medications as directed. Please refer to the Current Medication list given to you today.   Labwork: None  Testing/Procedures: You will be fitted for an oral appliance and have a sleep study and overnight pulse oximetry.  Follow-Up: You have a follow-up appointment with Dr. Toy Cookey Wednesday, April 12th at 1030 AM.  Your physician recommends that you schedule a follow-up appointment in: 3 months with Dr. Radford Pax.  Any Other Special Instructions Will Be Listed Below (If Applicable).     If you need a refill on your cardiac medications before your next appointment, please call your pharmacy.

## 2015-06-19 ENCOUNTER — Telehealth: Payer: Self-pay | Admitting: Family Medicine

## 2015-06-19 NOTE — Telephone Encounter (Signed)
Patient Name: Caleb [name]  DOB: [birth year]    Initial Comment Caller states having flu s/s and they are getting worse.    Nurse Assessment  Nurse: Raphael Gibney, RN, Vanita Ingles Date/Time (Eastern Time): 06/19/2015 10:16:58 AM  Confirm and document reason for call. If symptomatic, describe symptoms. You must click the next button to save text entered. ---Caller states he is heart failure pt. He has had flu for over 2 weeks. symptoms seem to be worse. He is congested. No cough. Lungs are clear per cardiology. Last night he had chills and shook. No fever. No chills right now.  Has the patient traveled out of the country within the last 30 days? ---No  Does the patient have any new or worsening symptoms? ---Yes  Will a triage be completed? ---Yes  Related visit to physician within the last 2 weeks? ---No  Does the PT have any chronic conditions? (i.e. diabetes, asthma, etc.) ---Yes  List chronic conditions. ---heart failure  Is this a behavioral health or substance abuse call? ---No     Guidelines    Guideline Title Affirmed Question Affirmed Notes  Influenza Follow-up Call [1] Using nasal washes and pain medicine > 24 hours AND [2] sinus pain (around cheekbone or eye) persists    Final Disposition User   See Physician within 24 Hours [place], RN, [place]    Comments  Dr. Sherren Mocha does not have any available appts today. called back line and advised her that pt has had flu x 2 weeks. He was chilling so hard last night he was shaking and is congested with outcome of see physician within 24 hrs and someone from Dr. Honor Junes office will call him back.   Referrals  REFERRED TO PCP OFFICE  REFERRED TO PCP OFFICE   Disagree/Comply: Comply

## 2015-06-19 NOTE — Telephone Encounter (Signed)
Spoke with patient and per Dr Sherren Mocha patient should increase his fluids,  tylenol as needed,  Bed rest and call back if needed.  Hydromet was offered but patient declined.

## 2015-06-24 ENCOUNTER — Ambulatory Visit: Payer: Medicare Other | Admitting: Cardiology

## 2015-06-25 ENCOUNTER — Other Ambulatory Visit (HOSPITAL_COMMUNITY): Payer: Self-pay | Admitting: Internal Medicine

## 2015-06-27 ENCOUNTER — Ambulatory Visit (HOSPITAL_COMMUNITY)
Admission: RE | Admit: 2015-06-27 | Discharge: 2015-06-27 | Disposition: A | Discharge status: Home / Self Care | Payer: Medicare Other | Source: Ambulatory Visit | Attending: Cardiology | Admitting: Cardiology

## 2015-06-27 VITALS — BP 128/56 | HR 55 | Wt 153.0 lb

## 2015-06-27 DIAGNOSIS — I6529 Occlusion and stenosis of unspecified carotid artery: Secondary | ICD-10-CM | POA: Diagnosis not present

## 2015-06-27 DIAGNOSIS — Z951 Presence of aortocoronary bypass graft: Secondary | ICD-10-CM | POA: Diagnosis not present

## 2015-06-27 DIAGNOSIS — Z8249 Family history of ischemic heart disease and other diseases of the circulatory system: Secondary | ICD-10-CM | POA: Insufficient documentation

## 2015-06-27 DIAGNOSIS — I255 Ischemic cardiomyopathy: Secondary | ICD-10-CM | POA: Diagnosis not present

## 2015-06-27 DIAGNOSIS — Z79899 Other long term (current) drug therapy: Secondary | ICD-10-CM | POA: Diagnosis not present

## 2015-06-27 DIAGNOSIS — I252 Old myocardial infarction: Secondary | ICD-10-CM | POA: Diagnosis not present

## 2015-06-27 DIAGNOSIS — I5022 Chronic systolic (congestive) heart failure: Secondary | ICD-10-CM | POA: Diagnosis not present

## 2015-06-27 DIAGNOSIS — I272 Other secondary pulmonary hypertension: Secondary | ICD-10-CM | POA: Diagnosis not present

## 2015-06-27 DIAGNOSIS — I480 Paroxysmal atrial fibrillation: Secondary | ICD-10-CM | POA: Diagnosis not present

## 2015-06-27 DIAGNOSIS — I4589 Other specified conduction disorders: Secondary | ICD-10-CM | POA: Diagnosis not present

## 2015-06-27 DIAGNOSIS — I110 Hypertensive heart disease with heart failure: Secondary | ICD-10-CM | POA: Diagnosis not present

## 2015-06-27 DIAGNOSIS — G4733 Obstructive sleep apnea (adult) (pediatric): Secondary | ICD-10-CM | POA: Insufficient documentation

## 2015-06-27 DIAGNOSIS — I2510 Atherosclerotic heart disease of native coronary artery without angina pectoris: Secondary | ICD-10-CM | POA: Diagnosis not present

## 2015-06-27 DIAGNOSIS — I351 Nonrheumatic aortic (valve) insufficiency: Secondary | ICD-10-CM | POA: Diagnosis not present

## 2015-06-27 DIAGNOSIS — E785 Hyperlipidemia, unspecified: Secondary | ICD-10-CM | POA: Insufficient documentation

## 2015-06-27 DIAGNOSIS — K219 Gastro-esophageal reflux disease without esophagitis: Secondary | ICD-10-CM | POA: Diagnosis not present

## 2015-06-27 DIAGNOSIS — Z7982 Long term (current) use of aspirin: Secondary | ICD-10-CM | POA: Insufficient documentation

## 2015-06-27 DIAGNOSIS — Z9889 Other specified postprocedural states: Secondary | ICD-10-CM

## 2015-06-27 DIAGNOSIS — I6523 Occlusion and stenosis of bilateral carotid arteries: Secondary | ICD-10-CM

## 2015-06-27 DIAGNOSIS — I872 Venous insufficiency (chronic) (peripheral): Secondary | ICD-10-CM | POA: Insufficient documentation

## 2015-06-27 DIAGNOSIS — I38 Endocarditis, valve unspecified: Secondary | ICD-10-CM

## 2015-06-27 DIAGNOSIS — Z8673 Personal history of transient ischemic attack (TIA), and cerebral infarction without residual deficits: Secondary | ICD-10-CM | POA: Insufficient documentation

## 2015-06-27 DIAGNOSIS — G629 Polyneuropathy, unspecified: Secondary | ICD-10-CM | POA: Insufficient documentation

## 2015-06-27 LAB — BASIC METABOLIC PANEL
ANION GAP: 10 (ref 5–15)
BUN: 15 mg/dL (ref 6–20)
CALCIUM: 9.2 mg/dL (ref 8.9–10.3)
CO2: 26 mmol/L (ref 22–32)
Chloride: 100 mmol/L — ABNORMAL LOW (ref 101–111)
Creatinine, Ser: 0.99 mg/dL (ref 0.61–1.24)
GFR calc non Af Amer: >60 mL/min (ref >60)
Glucose, Bld: 95 mg/dL (ref 65–99)
POTASSIUM: 4.4 mmol/L (ref 3.5–5.1)
Sodium: 136 mmol/L (ref 135–145)

## 2015-06-27 LAB — DIGOXIN LEVEL: DIGOXIN LVL: 0.8 ng/mL (ref 0.8–2.0)

## 2015-06-27 MED ORDER — BISOPROLOL FUMARATE 5 MG PO TABS: 2.5000 mg | ORAL_TABLET | Freq: Every day | ORAL | Status: DC

## 2015-06-27 MED FILL — BISOPROLOL FUMARATE 5 MG TA: 5 | 30 days supply | Qty: 15 | Fill #0

## 2015-06-27 NOTE — Progress Notes (Signed)
Patient ID: Caleb Moment, PhD, male   DOB: 11-05-1940, 75 y.o.   MRN: [id number]    Advanced Heart Failure Clinic Note   PCP: Dr. Sherren Mocha EP: [name]  75 yo with complex past history presents for heart failure evaluation.  Patient had anterior MI in 2004 and developed ischemic cardiomyopathy as well as mitral regurgitation. He also developed atrial fibrillation.  In 10/14, he had MV repair, Maze, CABG with LIMA-LAD, and LA appendage closure at [facility] in [place].  Subsequently, atrial fibrillation returned and he had an atrial fibrillation ablation in [facility] in 3/15.  He wore a Zio patch in 12/15 and had a low atrial fibrillation burden of 7%.  He has had a long-standing ischemic cardiomyopathy.  In 2015, EF was 20-25%.  Echo in 2016 also showed EF 25-30% but estimated PA pressure suggested severe pulmonary hypertension, which is new for him.    At initial appointment, he reported increased exertional dyspnea over a number of weeks.  RHC was done, showing mildly elevated PCWP with moderate pulmonary HTN and low cardiac index (1.93 thermo, 2.13 Fick).  V/Q scan showed no PE.  I started him on digoxin and have titrated his Lasix to 80 mg daily.  He is now on bisoprolol and able to tolerate it.  At last appointment, I had him try replacing valsartan with Entresto 24/26 bid.  He was unable to tolerate it (made him dizzy, though BP was not low).  Therefore, I had him restart valsartan.  CPX in 4/16 showed mildly decreased functional capacity.  He has tolerated Adcirca 20 mg daily.  He did not tolerate an attempt to uptitrate bisoprolol.  He tried CPAP but was unable to tolerate it.  Last echo in 1/17 showed EF 30-35%, stable MV repair, mild AI, but moderately dilated RV with mildly decreased systolic function and PA systolic pressure 69 mmHg.   He is doing better today.  At last appointment, had flu-like symptoms.  This has resolved.  He is now back to baseline.  He is only taking Lasix 40 mg  daily but weight is stable (down 1 lb on our scale).  No exertional dyspnea.  No chest pain. No orthopnea/PND.  He is in NSR today and has not felt palpitations.    6 minute walk (3/16): 381 m  6 minute walk (5/16): 414.5 m 6 minute walk (10/16): 562 m 6 minute walk (1/17): 469 m  Labs (2/15): LDL 144 Labs (8/15): K 4.6, creatinine 0.9 Labs (12/15): HCT 42.3   Labs (2/16): K 4 => 4.2, creatinine 1.05 => 0.92, BNP 268 Labs (3/16): BNP 495 => 320, digoxin 0.4, RF 14.7 (very mild increase), TSH normal, HIV negative, anti-SCL70 negative, creatinine 0.91, K 4.4 Labs (7/16): K 5, creatinine 1.05, BNP 455, vitamin D normal, digoxin 0.4, B12 normal Labs (8/16): digoxin 0.8, BNP 305, K 4.2, creatinine 0.99 Labs (9/16): HCT 43.3, TSH normal, BNP 492 => 278, K 4.3, creatinine 0.97 => 1.04, digoxin 0.6, TSH normal, LDL 154, LDL-P 1654.  Labs (10/16): K 4.7, creatinine 1.1 Labs (1/17): pro-BNP 2065, digoxin 0.3, K 4.3, creatinine 1.10 => 1.28 Labs (3/17): K 4.1, creatinine 1.09, HCT 38.3  PMH: 1. CAD: Anterior MI in 2004.  Cardiac surgery in 10/14 included LIMA-LAD.  2. Chronic mitral regurgitation: 10/14 surgery at [facility] with MV repair, Maze, LIMA-LAD, and LA appendage closure.  3. Atrial fibrillation: Paroxysmal.  He was initially on Tikosyn but had breakthrough atrial fibrillation.  H/o Maze in 2014.  Had recurrent atrial fibrillation with ablation in 3/15 by Dr Ola Spurr in [facility].  Not anticoagulated after 2 severe prostate bleeding episodes.  - Zio patch in 12/15 with low atrial fibrillation burden (7%).  - Holter (9/16) with PACs, PVCs, short atrial fibrillation runs (nothing sustained). 4. Chronotropic incompetence.  5. HTN 6. Hyperlipidemia: Refuses statin.  7. Peripheral neuropathy 8. GERD 9. H/o BPPV 10. H/o TIA 11. Ischemic cardiomyopathy: cardiac MRI 7/14 with EF 35%, moderate MR, normal RV size and systolic function, extensive anterior and anteroseptal LGE suggestive of  non-viable myocardium (this was prior to LIMA-LAD).  Echo 1/15 with EF 20-25%, moderate AI, PA systolic pressure 39 mmHg. Echo (1/16) with EF 20-25%, diffuse hypokinesis with regionality, moderate LV dilation, moderate AI, s/p MV repair with mild MR and normal gradients, RV dilated with mildly decreased systolic function, PA systolic pressure 71 mmHg.  RHC (2/16) with mean RA 6, PA 61/25 mean 40, mean PCWP 23, CI 2.13/PVR 4.3 (Fick), CI 1.93/PVR 4.7 (thermo).  CPX (4/16) with peak VO2 17.9, VE/VCO2 34.7 => mildly decreased functional capacity.  - Spironolactone apparently caused cognitive deficits - Intolerant of Coreg due to development of severe alopecia - Unable to uptitrate bisoprolol due to intolerance.  - Lightheaded with Entresto.  - Unable to tolerate increase in valsartan to 80 mg bid.  - Echo (1/17) with EF 30-35%, moderate LV dilation, mild AI, s/p MV repair with mild MR, moderately dilated RV with mildly decreased systolic function, PA systolic pressure 69 mmHg.  12. Carotid dopplers (1/16) with 40-59% bilateral ICA stenosis.  13. Aortic insufficiency: Moderate by last echo 2/16.  14. Pulmonary HTN: Mixed PAH and pulmonary venous hypertension.  PFTs (9/14) were normal.  V/Q scan (2/16) with no evidence of acute or chronic PE.  6 minute walk (3/16) 381 m. 6 minute walk (5/16) 414.5 m. 6 minute walk (1/17) 469 m.  15. OSA: Moderate on 5/16 sleep study. Unable to tolerate CPAP.  16. Venous insufficiency  SH: Married, lives in [place], Engineer, water, nonsmoker  FH: CAD  ROS: All systems reviewed and negative except as per HPI.   Current Outpatient Prescriptions  Medication Sig Dispense Refill  . aspirin 81 MG chewable tablet Chew 81 mg by mouth daily.    . bisoprolol (ZEBETA) 5 MG tablet Take 0.5 tablets (2.5 mg total) by mouth daily. 15 tablet 6  . chlorproMAZINE (THORAZINE) 25 MG tablet Take 1 tablet (25 mg total) by mouth 3 (three) times daily. 90 tablet 6  . digoxin (LANOXIN)  0.125 MG tablet TAKE 1 TABLET BY MOUTH ONCE DAILY 90 tablet 3  . eplerenone (INSPRA) 25 MG tablet TAKE 1 TABLET BY MOUTH ONCE DAILY 30 tablet 6  . furosemide (LASIX) 40 MG tablet Take 40 mg by mouth daily.    . Magnesium 100 MG CAPS Take 1 capsule by mouth 3 (three) times daily.     . Omega-3 Fatty Acids (THE VERY FINEST FISH OIL) LIQD Take 2-3 g by mouth daily.     . potassium chloride (K-DUR) 10 MEQ tablet Take 10 mEq by mouth every other day.    . Tadalafil, PAH, (ADCIRCA) 20 MG TABS Take 1 tablet (20 mg total) by mouth daily. 30 tablet 6  . valsartan (DIOVAN) 40 MG tablet Take 1 tablet every morning and 2 tablets every evening. 90 tablet 3  . Zinc 100 MG TABS Take 1 tablet by mouth daily.      No current facility-administered medications for this encounter.  BP 128/56 mmHg  Pulse 55  Wt 153 lb (69.4 kg)  SpO2 98%   Wt Readings from Last 3 Encounters:  06/27/15 153 lb (69.4 kg)  06/18/15 149 lb (67.586 kg)  06/10/15 154 lb 8 oz (70.081 kg)     General: NAD Neck: No JVD, no thyromegaly or thyroid nodule.  Lungs: Clear to auscultation bilaterally with normal respiratory effort. CV: Lateral PMI.  Heart regular S1/S2, no S3/S4, 2/6 HSM LLSB.  Trace ankle edema.  No carotid bruit.  Normal pedal pulses.  Abdomen: Soft, NT, ND, no HSM. No bruits or masses. +BS  Neurologic: Alert and oriented x 3.  Psych: Normal affect. Extremities: No clubbing or cyanosis.  HEENT: Normal.   Assessment/Plan: 1. CAD: This seems stable, no chest pain. S/p LIMA-LAD.  He is on ASA 81.  He has decided not to take statins after reviewing the data.   2. S/p mitral valve repair: The MV repair looked stable on 1/17 echo with mild MR and no evidence for significant mitral stenosis.  3. Aortic insufficiency: Mild on 1/17 echo, improved from prior.  4. Chronic systolic CHF: Ischemic cardiomyopathy, EF 30-35% on 1/17 echo.  PA pressure remains elevated by doppler measurement and the RV is moderately dilated with  mildly decreased systolic function.  Mildly decreased functional capacity on 4/16 CPX.  NYHA class II. He does not appear volume overloaded today.  - Continue Lasix 40 mg daily for now.  If weight rises, go back to 40 qam/20 qpm.  BMET/BNP today.  - Continue digoxin, check level today.   - Continue bisoprolol 2.5 mg daily (unable to increase).   - Continue valsartan 40 qam/80 qpm.  He did not tolerate Entresto or further uptitration of valsartan.    - Continue eplerenone.      - He does not want an ICD and understands the reasoning for having one.  - I will arrange for repeat CPX. - We talked today about the barostim trial.  Our research team will see him today. - In the future, he could be an LVAD candidate if he worsens.  5. Atrial fibrillation: s/p Maze in 10/14, then ablation in 3/15.  Prior to Maze, he was on Tikosyn but had breakthrough.  Currently, he is not anticoagulated due to history of prostate bleeding.  He was in atrial fibrillation during his recent respiratory illness but is back in NSR today. - He remains reluctant to be anticoagulated. - Continue bisoprolol.  6. Pulmonary hypertension: Moderate to severe by most recent echo in 1/17, moderate by last RHC.  The RV was moderately dilated and mildly dysfunction on most recent echo.  Mixed pulmonary venous and pulmonary arterial HTN.  It is possible that the [facility] component is due to pulmonary vascular remodeling in the setting of chronic mitral regurgitation prior to MV repair. Negative V/Q scan, no evidence for CTEPH.  PFTs normal in 9/14.  He is seeing Dr Lake Bells.  Sleep study showed moderate OSA but he has been unable to tolerated CPAP.  He has started on Adcirca and felt like it helped.   - Have recommended he increase Adcirca to 40 mg daily.  He wants to keep dose at 20 mg daily for now.  7. OSA: Moderate.  Has not tolerated CPAP. Probably plays a role in recurrent atrial fibrillation.  Working on getting mouthpiece.  8. Carotid  stenosis: I will arrange for carotid dopplers.    Loralie Champagne 06/27/2015

## 2015-06-27 NOTE — Patient Instructions (Signed)
Labs today  Your physician has recommended that you have a cardiopulmonary stress test (CPX). CPX testing is a non-invasive measurement of heart and lung function. It replaces a traditional treadmill stress test. This type of test provides a tremendous amount of information that relates not only to your present condition but also for future outcomes. This test combines measurements of you ventilation, respiratory gas exchange in the lungs, electrocardiogram (EKG), blood pressure and physical response before, during, and following an exercise protocol.  Your physician has requested that you have a carotid duplex. This test is an ultrasound of the carotid arteries in your neck. It looks at blood flow through these arteries that supply the brain with blood. Allow one hour for this exam. There are no restrictions or special instructions.  Your physician recommends that you schedule a follow-up appointment in: 3 months

## 2015-07-03 MED FILL — VALSARTAN 40 MG TABLET: 40 | 30 days supply | Qty: 90 | Fill #2

## 2015-07-03 MED FILL — EPLERENONE 25 MG TABLET: 25 | 30 days supply | Qty: 30 | Fill #4

## 2015-07-08 ENCOUNTER — Ambulatory Visit (HOSPITAL_COMMUNITY)
Admission: RE | Admit: 2015-07-08 | Discharge: 2015-07-08 | Disposition: A | Discharge status: Home / Self Care | Payer: Medicare Other | Source: Ambulatory Visit | Attending: Cardiology | Admitting: Cardiology

## 2015-07-08 DIAGNOSIS — K219 Gastro-esophageal reflux disease without esophagitis: Secondary | ICD-10-CM | POA: Diagnosis not present

## 2015-07-08 DIAGNOSIS — I110 Hypertensive heart disease with heart failure: Secondary | ICD-10-CM | POA: Insufficient documentation

## 2015-07-08 DIAGNOSIS — E785 Hyperlipidemia, unspecified: Secondary | ICD-10-CM | POA: Insufficient documentation

## 2015-07-08 DIAGNOSIS — I5023 Acute on chronic systolic (congestive) heart failure: Secondary | ICD-10-CM | POA: Diagnosis not present

## 2015-07-08 DIAGNOSIS — I6523 Occlusion and stenosis of bilateral carotid arteries: Secondary | ICD-10-CM | POA: Insufficient documentation

## 2015-07-08 DIAGNOSIS — G4733 Obstructive sleep apnea (adult) (pediatric): Secondary | ICD-10-CM | POA: Insufficient documentation

## 2015-07-08 DIAGNOSIS — I429 Cardiomyopathy, unspecified: Secondary | ICD-10-CM | POA: Diagnosis not present

## 2015-07-15 MED FILL — ADCIRCA 20 MG TABLET: 20 | 30 days supply | Qty: 30 | Fill #3

## 2015-07-15 MED FILL — FUROSEMIDE 40 MG TABLET: 40 | 30 days supply | Qty: 90 | Fill #3

## 2015-07-17 ENCOUNTER — Ambulatory Visit (HOSPITAL_COMMUNITY): Payer: Medicare Other | Attending: Cardiology

## 2015-07-17 DIAGNOSIS — I5022 Chronic systolic (congestive) heart failure: Secondary | ICD-10-CM | POA: Diagnosis not present

## 2015-07-18 DIAGNOSIS — I5022 Chronic systolic (congestive) heart failure: Secondary | ICD-10-CM | POA: Diagnosis not present

## 2015-07-23 MED FILL — BISOPROLOL FUMARATE 5 MG TA: 5 | 30 days supply | Qty: 15 | Fill #1

## 2015-07-26 ENCOUNTER — Telehealth (HOSPITAL_COMMUNITY): Payer: Self-pay

## 2015-07-26 NOTE — Telephone Encounter (Signed)
Stable carotid duplex results reviewed with patient. Advised per [name] to plan to have study repeated in 1 year. Also scheduled f/u apt per patient request.  Caleb [name]

## 2015-08-01 MED FILL — EPLERENONE 25 MG TABLET: 25 | 30 days supply | Qty: 30 | Fill #5

## 2015-08-05 ENCOUNTER — Encounter (HOSPITAL_COMMUNITY): Payer: Self-pay

## 2015-08-05 ENCOUNTER — Ambulatory Visit (HOSPITAL_COMMUNITY)
Admission: RE | Admit: 2015-08-05 | Discharge: 2015-08-05 | Disposition: A | Discharge status: Home / Self Care | Payer: Medicare Other | Source: Ambulatory Visit | Attending: Cardiology | Admitting: Cardiology

## 2015-08-05 VITALS — BP 124/58 | HR 64 | Wt 156.8 lb

## 2015-08-05 DIAGNOSIS — I6523 Occlusion and stenosis of bilateral carotid arteries: Secondary | ICD-10-CM | POA: Insufficient documentation

## 2015-08-05 DIAGNOSIS — I252 Old myocardial infarction: Secondary | ICD-10-CM | POA: Insufficient documentation

## 2015-08-05 DIAGNOSIS — I351 Nonrheumatic aortic (valve) insufficiency: Secondary | ICD-10-CM | POA: Diagnosis not present

## 2015-08-05 DIAGNOSIS — I4589 Other specified conduction disorders: Secondary | ICD-10-CM | POA: Insufficient documentation

## 2015-08-05 DIAGNOSIS — Z8249 Family history of ischemic heart disease and other diseases of the circulatory system: Secondary | ICD-10-CM | POA: Insufficient documentation

## 2015-08-05 DIAGNOSIS — K219 Gastro-esophageal reflux disease without esophagitis: Secondary | ICD-10-CM | POA: Insufficient documentation

## 2015-08-05 DIAGNOSIS — I272 Other secondary pulmonary hypertension: Secondary | ICD-10-CM | POA: Insufficient documentation

## 2015-08-05 DIAGNOSIS — I4891 Unspecified atrial fibrillation: Secondary | ICD-10-CM | POA: Diagnosis not present

## 2015-08-05 DIAGNOSIS — Z7982 Long term (current) use of aspirin: Secondary | ICD-10-CM | POA: Insufficient documentation

## 2015-08-05 DIAGNOSIS — I5022 Chronic systolic (congestive) heart failure: Secondary | ICD-10-CM

## 2015-08-05 DIAGNOSIS — Z955 Presence of coronary angioplasty implant and graft: Secondary | ICD-10-CM

## 2015-08-05 DIAGNOSIS — I110 Hypertensive heart disease with heart failure: Secondary | ICD-10-CM | POA: Insufficient documentation

## 2015-08-05 DIAGNOSIS — G4733 Obstructive sleep apnea (adult) (pediatric): Secondary | ICD-10-CM | POA: Diagnosis not present

## 2015-08-05 DIAGNOSIS — G629 Polyneuropathy, unspecified: Secondary | ICD-10-CM | POA: Diagnosis not present

## 2015-08-05 DIAGNOSIS — E785 Hyperlipidemia, unspecified: Secondary | ICD-10-CM | POA: Diagnosis not present

## 2015-08-05 DIAGNOSIS — Z8673 Personal history of transient ischemic attack (TIA), and cerebral infarction without residual deficits: Secondary | ICD-10-CM | POA: Diagnosis not present

## 2015-08-05 DIAGNOSIS — I480 Paroxysmal atrial fibrillation: Secondary | ICD-10-CM

## 2015-08-05 DIAGNOSIS — I872 Venous insufficiency (chronic) (peripheral): Secondary | ICD-10-CM | POA: Diagnosis not present

## 2015-08-05 DIAGNOSIS — I2510 Atherosclerotic heart disease of native coronary artery without angina pectoris: Secondary | ICD-10-CM | POA: Diagnosis not present

## 2015-08-05 DIAGNOSIS — I255 Ischemic cardiomyopathy: Secondary | ICD-10-CM | POA: Insufficient documentation

## 2015-08-05 DIAGNOSIS — Z79899 Other long term (current) drug therapy: Secondary | ICD-10-CM | POA: Insufficient documentation

## 2015-08-05 LAB — BASIC METABOLIC PANEL
Anion gap: 12 (ref 5–15)
BUN: 18 mg/dL (ref 6–20)
CALCIUM: 9.5 mg/dL (ref 8.9–10.3)
CO2: 24 mmol/L (ref 22–32)
CREATININE: 1.13 mg/dL (ref 0.61–1.24)
Chloride: 97 mmol/L — ABNORMAL LOW (ref 101–111)
GFR calc Af Amer: >60 mL/min (ref >60)
GFR calc non Af Amer: >60 mL/min (ref >60)
GLUCOSE: 112 mg/dL — AB (ref 65–99)
Potassium: 4.8 mmol/L (ref 3.5–5.1)
SODIUM: 133 mmol/L — AB (ref 135–145)

## 2015-08-05 NOTE — Progress Notes (Signed)
Advanced Heart Failure Medication Review by a Pharmacist  Does the patient  feel that his/her medications are working for him/her?  yes  Has the patient been experiencing any side effects to the medications prescribed?  no  Does the patient measure his/her own blood pressure or blood glucose at home?  yes   Does the patient have any problems obtaining medications due to transportation or finances?   no  Understanding of regimen: good Understanding of indications: good Potential of compliance: good Patient understands to avoid NSAIDs. Patient understands to avoid decongestants.  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 He reports good compliance with his regimen. He did state that he tried chlorpromazine for intractable hiccups which did not work but they did resolve after an acupuncture session. He did not have any other medication-related questions or concerns for me at this time.   [name], PharmD, BCPS, CPP Clinical Pharmacist Pager: [phone] Phone: [phone] 08/05/2015 11:13 AM      Time with patient: 10 minutes Preparation and documentation time: 2 minutes Total time: 12 minutes

## 2015-08-05 NOTE — Progress Notes (Signed)
Patient ID: [name], PhD, male   DOB: 02-Sep-1940, 75 y.o.   MRN: [id number]    Advanced Heart Failure Clinic Note   PCP: Dr. Sherren Mocha EP: [name]  75 yo with complex past history presents for heart failure evaluation.  Patient had anterior MI in 2004 and developed ischemic cardiomyopathy as well as mitral regurgitation. He also developed atrial fibrillation.  In 10/14, he had MV repair, Maze, CABG with LIMA-LAD, and LA appendage closure at [facility] in [place].  Subsequently, atrial fibrillation returned and he had an atrial fibrillation ablation in [facility] in 3/15.  He wore a Zio patch in 12/15 and had a low atrial fibrillation burden of 7%.  He has had a long-standing ischemic cardiomyopathy.  In 2015, EF was 20-25%.  Echo in 2016 also showed EF 25-30% but estimated PA pressure suggested severe pulmonary hypertension, which is new for him.    At initial appointment, he reported increased exertional dyspnea over a number of weeks.  RHC was done, showing mildly elevated PCWP with moderate pulmonary HTN and low cardiac index (1.93 thermo, 2.13 Fick).  V/Q scan showed no PE.  I started him on digoxin and have titrated his Lasix to 80 mg daily.  He is now on bisoprolol and able to tolerate it.  At last appointment, I had him try replacing valsartan with Entresto 24/26 bid.  He was unable to tolerate it (made him dizzy, though BP was not low).  Therefore, I had him restart valsartan.  CPX in 4/16 showed mildly decreased functional capacity.  He has tolerated Adcirca 20 mg daily.  He did not tolerate an attempt to uptitrate bisoprolol.  He tried CPAP but was unable to tolerate it.  Last echo in 1/17 showed EF 30-35%, stable MV repair, mild AI, but moderately dilated RV with mildly decreased systolic function and PA systolic pressure 69 mmHg.   He is doing well today.  No exertional dyspnea or chest pain.  No orthopnea/PND.  He is now using a mouthpiece for sleep apnea and is feeling more rested.       6 minute walk (3/16): 381 m  6 minute walk (5/16): 414.5 m 6 minute walk (10/16): 562 m 6 minute walk (1/17): 469 m  Labs (2/15): LDL 144 Labs (8/15): K 4.6, creatinine 0.9 Labs (12/15): HCT 42.3   Labs (2/16): K 4 => 4.2, creatinine 1.05 => 0.92, BNP 268 Labs (3/16): BNP 495 => 320, digoxin 0.4, RF 14.7 (very mild increase), TSH normal, HIV negative, anti-SCL70 negative, creatinine 0.91, K 4.4 Labs (7/16): K 5, creatinine 1.05, BNP 455, vitamin D normal, digoxin 0.4, B12 normal Labs (8/16): digoxin 0.8, BNP 305, K 4.2, creatinine 0.99 Labs (9/16): HCT 43.3, TSH normal, BNP 492 => 278, K 4.3, creatinine 0.97 => 1.04, digoxin 0.6, TSH normal, LDL 154, LDL-P 1654.  Labs (10/16): K 4.7, creatinine 1.1 Labs (1/17): pro-BNP 2065, digoxin 0.3, K 4.3, creatinine 1.10 => 1.28 Labs (3/17): K 4.1, creatinine 1.09, HCT 38.3 Labs (4/17): K 4.4, creatinine 0.99, digoxin 0.8  PMH: 1. CAD: Anterior MI in 2004.  Cardiac surgery in 10/14 included LIMA-LAD.  2. Chronic mitral regurgitation: 10/14 surgery at [facility] with MV repair, Maze, LIMA-LAD, and LA appendage closure.  3. Atrial fibrillation: Paroxysmal.  He was initially on Tikosyn but had breakthrough atrial fibrillation.  H/o Maze in 2014.  Had recurrent atrial fibrillation with ablation in 3/15 by Dr Ola Spurr in [facility].  Not anticoagulated after 2 severe prostate bleeding  episodes.  LA appendage was clipped with MV surgery.  - Zio patch in 12/15 with low atrial fibrillation burden (7%).  - Holter (9/16) with PACs, PVCs, short atrial fibrillation runs (nothing sustained). 4. Chronotropic incompetence.  5. HTN 6. Hyperlipidemia: Refuses statin.  7. Peripheral neuropathy 8. GERD 9. H/o BPPV 10. H/o TIA 11. Ischemic cardiomyopathy: cardiac MRI 7/14 with EF 35%, moderate MR, normal RV size and systolic function, extensive anterior and anteroseptal LGE suggestive of non-viable myocardium (this was prior to LIMA-LAD).  Echo 1/15 with  EF 20-25%, moderate AI, PA systolic pressure 39 mmHg. Echo (1/16) with EF 20-25%, diffuse hypokinesis with regionality, moderate LV dilation, moderate AI, s/p MV repair with mild MR and normal gradients, RV dilated with mildly decreased systolic function, PA systolic pressure 71 mmHg.   - RHC (2/16) with mean RA 6, PA 61/25 mean 40, mean PCWP 23, CI 2.13/PVR 4.3 (Fick), CI 1.93/PVR 4.7 (thermo).   - CPX (4/16) with peak VO2 17.9, VE/VCO2 34.7 => mildly decreased functional capacity.  - Spironolactone apparently caused cognitive deficits - Intolerant of Coreg due to development of severe alopecia - Unable to uptitrate bisoprolol due to intolerance.  - Lightheaded with Entresto.  - Unable to tolerate increase in valsartan to 80 mg bid.  - Echo (1/17) with EF 30-35%, moderate LV dilation, mild AI, s/p MV repair with mild MR, moderately dilated RV with mildly decreased systolic function, PA systolic pressure 69 mmHg.  - CPX (4/17): peak VO2 18, VE/VCO2 slope 33, RER 1.28 => mild to moderate functional impairment, mildly improved.  12. Carotid stenosis: Carotid dopplers (1/16) with 40-59% bilateral ICA stenosis. Carotid dopplers (4/17) with 40-59% BICA stenosis.  13. Aortic insufficiency: Moderate by last echo 2/16.  14. Pulmonary HTN: Mixed PAH and pulmonary venous hypertension.  PFTs (9/14) were normal.  V/Q scan (2/16) with no evidence of acute or chronic PE.  6 minute walk (3/16) 381 m. 6 minute walk (5/16) 414.5 m. 6 minute walk (1/17) 469 m.  15. OSA: Moderate on 5/16 sleep study. Unable to tolerate CPAP.  16. 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   Current Outpatient Prescriptions  Medication Sig Dispense Refill  . aspirin 81 MG chewable tablet Chew 81 mg by mouth daily.    . bisoprolol (ZEBETA) 5 MG tablet Take 0.5 tablets (2.5 mg total) by mouth daily. 15 tablet 6  . digoxin  (LANOXIN) 0.125 MG tablet TAKE 1 TABLET BY MOUTH ONCE DAILY 90 tablet 3  . eplerenone (INSPRA) 25 MG tablet TAKE 1 TABLET BY MOUTH ONCE DAILY 30 tablet 6  . furosemide (LASIX) 20 MG tablet Take 60 mg by mouth daily.    . Magnesium 100 MG CAPS Take 1 capsule by mouth 3 (three) times daily.     . Multiple Vitamins-Minerals (MULTIVITAMIN WITH MINERALS) tablet Take 1 tablet by mouth daily.    . Omega-3 Fatty Acids (THE VERY FINEST FISH OIL) LIQD Take 2-3 g by mouth daily.     . potassium chloride (K-DUR) 10 MEQ tablet Take 10 mEq by mouth every other day.    . Tadalafil, PAH, (ADCIRCA) 20 MG TABS Take 1 tablet (20 mg total) by mouth daily. 30 tablet 6  . valsartan (DIOVAN) 40 MG tablet Take 1 tablet every morning and 2 tablets every evening. 90 tablet 3  . Zinc 100 MG TABS Take 1 tablet by mouth daily.  No current facility-administered medications for this encounter.   BP 124/58 mmHg  Pulse 64  Wt 156 lb 12 oz (71.101 kg)  SpO2 99%   Wt Readings from Last 3 Encounters:  08/05/15 156 lb 12 oz (71.101 kg)  06/27/15 153 lb (69.4 kg)  06/18/15 149 lb (67.586 kg)     General: NAD Neck: No JVD, no thyromegaly or thyroid nodule.  Lungs: Clear to auscultation bilaterally with normal respiratory effort. CV: Lateral PMI.  Heart regular S1/S2, no S3/S4, 2/6 HSM LLSB.  No edema.  No carotid bruit.  Normal pedal pulses.  Abdomen: Soft, NT, ND, no HSM. No bruits or masses. +BS  Neurologic: Alert and oriented x 3.  Psych: Normal affect. Extremities: No clubbing or cyanosis.  HEENT: Normal.   Assessment/Plan: 1. CAD: This seems stable, no chest pain. S/p LIMA-LAD.  He is on ASA 81.  He has decided not to take statins after reviewing the data.   2. S/p mitral valve repair: The MV repair looked stable on 1/17 echo with mild MR and no evidence for significant mitral stenosis.  3. Aortic insufficiency: Mild on 1/17 echo, improved from prior.  4. Chronic systolic CHF: Ischemic cardiomyopathy, EF  30-35% on 1/17 echo.  PA pressure remains elevated by doppler measurement and the RV is moderately dilated with mildly decreased systolic function.  Mildly decreased functional capacity on 4/16 CPX, this was mildly improved on 4/17 CPX.  NYHA class II. He does not appear volume overloaded today.  - Continue Lasix 40 mg qam/20 qpm.  BMET/BNP today.  - Continue digoxin, check level today.   - Continue bisoprolol 2.5 mg daily (unable to increase).   - Continue valsartan 40 qam/80 qpm.  He did not tolerate Entresto or further uptitration of valsartan.    - Continue eplerenone.      - He does not want an ICD and understands the reasoning for having one.  - He is being screened for the Barostim trial.  Our research team will see him today. - In the future, he could be an LVAD candidate if he worsens.  5. Atrial fibrillation: s/p Maze in 10/14, then ablation in 3/15.  Prior to Maze, he was on Tikosyn but had breakthrough.  Currently, he is not anticoagulated due to history of prostate bleeding.  He did have his LA appendage clipped at time of MV surgery.  NSR today. - He remains reluctant to be anticoagulated. - Continue bisoprolol.  6. Pulmonary hypertension: Moderate to severe by most recent echo in 1/17, moderate by last RHC.  The RV was moderately dilated and mildly dysfunction on most recent echo.  Mixed pulmonary venous and pulmonary arterial HTN.  It is possible that the [facility] component is due to pulmonary vascular remodeling in the setting of chronic mitral regurgitation prior to MV repair. Negative V/Q scan, no evidence for CTEPH.  PFTs normal in 9/14.  He is seeing Dr Lake Bells.  Sleep study showed moderate OSA but he has been unable to tolerated CPAP.  He has started on Adcirca and felt like it helped.   - Have recommended he increase Adcirca to 40 mg daily.  He wants to keep dose at 20 mg daily for now.  7. OSA: Moderate.  Has not tolerated CPAP. Probably plays a role in recurrent atrial fibrillation.   He now uses and mouthpiece at night and feels like this helps.  8. Carotid stenosis: Stable carotid dopplers, repeat in 4/18.    Loralie Champagne 08/05/2015

## 2015-08-05 NOTE — Patient Instructions (Signed)
Routine lab work today. Will notify you of abnormal results  Follow up with Dr.McLean in 3 month.

## 2015-08-06 NOTE — Progress Notes (Unsigned)
08-05-15 Spoke to patient regarding BeAT-HF Study. Patient's questions answered and we will speak again in the next 1-2 days as he labs from today result. Patient information pamphlet regarding Barostim neo given.

## 2015-08-08 ENCOUNTER — Other Ambulatory Visit: Payer: Self-pay | Admitting: Internal Medicine

## 2015-08-08 MED FILL — VALSARTAN 40 MG TABLET: 40 | 30 days supply | Qty: 90 | Fill #3

## 2015-08-12 MED FILL — ADCIRCA 20 MG TABLET: 20 | 30 days supply | Qty: 30 | Fill #0

## 2015-08-15 MED FILL — FUROSEMIDE 40 MG TABLET: 40 | 30 days supply | Qty: 90 | Fill #4

## 2015-08-16 ENCOUNTER — Ambulatory Visit (HOSPITAL_COMMUNITY)
Admission: RE | Admit: 2015-08-16 | Discharge: 2015-08-16 | Disposition: A | Discharge status: Home / Self Care | Payer: Medicare Other | Source: Ambulatory Visit | Attending: Cardiology | Admitting: Cardiology

## 2015-08-16 DIAGNOSIS — I5022 Chronic systolic (congestive) heart failure: Secondary | ICD-10-CM | POA: Insufficient documentation

## 2015-08-16 LAB — CBC
HEMATOCRIT: 40.2 % (ref 39.0–52.0)
HEMOGLOBIN: 13.1 g/dL (ref 13.0–17.0)
MCH: 30.1 pg (ref 26.0–34.0)
MCHC: 32.6 g/dL (ref 30.0–36.0)
MCV: 92.4 fL (ref 78.0–100.0)
Platelets: 152 10*3/uL (ref 150–400)
RBC: 4.35 MIL/uL (ref 4.22–5.81)
RDW: 14 % (ref 11.5–15.5)
WBC: 4.7 10*3/uL (ref 4.0–10.5)

## 2015-08-16 LAB — BASIC METABOLIC PANEL
ANION GAP: 9 (ref 5–15)
BUN: 20 mg/dL (ref 6–20)
CHLORIDE: 101 mmol/L (ref 101–111)
CO2: 26 mmol/L (ref 22–32)
CREATININE: 1.08 mg/dL (ref 0.61–1.24)
Calcium: 9.4 mg/dL (ref 8.9–10.3)
GFR calc non Af Amer: >60 mL/min (ref >60)
Glucose, Bld: 94 mg/dL (ref 65–99)
POTASSIUM: 4.3 mmol/L (ref 3.5–5.1)
SODIUM: 136 mmol/L (ref 135–145)

## 2015-08-16 LAB — BRAIN NATRIURETIC PEPTIDE: B NATRIURETIC PEPTIDE 5: 317 pg/mL — AB (ref 0.0–100.0)

## 2015-08-16 NOTE — Addendum Note (Signed)
Encounter addended by: Harvie Junior, CMA on: 08/16/2015 10:15 AM<BR>     Documentation filed: [employer], Orders

## 2015-08-17 LAB — HEMOGLOBIN A1C
Hgb A1c MFr Bld: 5.5 % (ref 4.8–5.6)
MEAN PLASMA GLUCOSE: 111 mg/dL

## 2015-08-22 MED FILL — BISOPROLOL FUMARATE 5 MG TA: 5 | 30 days supply | Qty: 15 | Fill #2

## 2015-08-22 MED FILL — DIGOXIN 125 MCG TABLET: 125 | 90 days supply | Qty: 90 | Fill #1

## 2015-08-28 MED FILL — AMOXICILLIN 500 MG CAPSULE: 500 | 1 days supply | Qty: 4 | Fill #0

## 2015-08-29 ENCOUNTER — Encounter (HOSPITAL_COMMUNITY): Payer: Self-pay | Admitting: *Deleted

## 2015-09-02 MED FILL — EPLERENONE 25 MG TABLET: 25 | 30 days supply | Qty: 30 | Fill #6

## 2015-09-05 ENCOUNTER — Encounter: Payer: Self-pay | Admitting: Cardiology

## 2015-09-05 ENCOUNTER — Ambulatory Visit (INDEPENDENT_AMBULATORY_CARE_PROVIDER_SITE_OTHER): Payer: Medicare Other | Admitting: Cardiology

## 2015-09-05 VITALS — BP 136/56 | HR 56 | Ht 70.0 in | Wt 153.8 lb

## 2015-09-05 DIAGNOSIS — I272 Other secondary pulmonary hypertension: Secondary | ICD-10-CM | POA: Diagnosis not present

## 2015-09-05 DIAGNOSIS — G4733 Obstructive sleep apnea (adult) (pediatric): Secondary | ICD-10-CM

## 2015-09-05 DIAGNOSIS — I10 Essential (primary) hypertension: Secondary | ICD-10-CM | POA: Diagnosis not present

## 2015-09-05 DIAGNOSIS — I6523 Occlusion and stenosis of bilateral carotid arteries: Secondary | ICD-10-CM | POA: Diagnosis not present

## 2015-09-05 NOTE — Patient Instructions (Signed)
Medication Instructions:  Your physician recommends that you continue on your current medications as directed. Please refer to the Current Medication list given to you today.   Labwork: None  Testing/Procedures: None  Follow-Up: Your physician wants you to follow-up in: 1 year with Dr. Radford Pax. You will receive a reminder letter in the mail two months in advance. If you don't receive a letter, please call our office to schedule the follow-up appointment.

## 2015-09-05 NOTE — Progress Notes (Signed)
Cardiology Office Note    Date:  09/05/2015   ID:  Caleb Moment, Caleb [name], DOB [birth year], MRN [id number]  PCP:  Joycelyn Man, MD  Cardiologist:  Fransico Him, MD   Chief Complaint  Patient presents with  . Sleep Apnea  . Hypertension    History of Present Illness:  Caleb Moment, Caleb [name] is a 75 y.o. male with a history HTN, OSA and obesity who presents today for followup. He has a history of snoring, excessive daytime sleepiness and pulmonary HTN as well as PAF. PSG showed moderate OSA with an AHI of 18.5/hr with most events occurring in the nonsupine position. He had oxygen desaturations as low as 86%. CPAP therapy was recommended but patient did not tolerate the CPAP and sent his machine back. When I last saw him we decided to refer him to dentistry for oral device.  He also has severe pulmonary HTN that is multifactorial from pulmonary venous HTN/pulomonary arterial HTN and OSA with hypoxia followed by Drs. McLean and Pitman.  He is tolerating his oral device.  He brought in his last HST and his AHI was 12.5/hr and he is going to have his device adjusted and repeat HST.  He says that his snoring has resolved. He feels more rested in the and is less sleepy in the daytime.      Past Medical History  Diagnosis Date  . CAD (coronary artery disease)   . Atrial fibrillation ([place])   . Cardiomyopathy, ischemic   . Heart failure, systolic, acute on chronic ([place])   . Hypertension   . Hyperlipidemia   . Pleural effusion   . Positional vertigo   . Dizziness   . GERD (gastroesophageal reflux disease)   . Atypical pneumonia   . Cough   . Allergic rhinitis   . TIA (transient ischemic attack)   . OSA (obstructive sleep apnea)     Home sleep test 07/05/2009 AHI 8.2  . Chronic anticoagulation     Past Surgical History  Procedure Laterality Date  . Coronary stent placement  2004    LAD  . Shoulder surgery    . Tee without cardioversion N/A 09/21/2012    Procedure:  TRANSESOPHAGEAL ECHOCARDIOGRAM (TEE);  Surgeon: Larey Dresser, MD;  Location: [facility] ENDOSCOPY;  Service: Cardiovascular;  Laterality: N/A;  . Coronary artery bypass graft  01/09/13    LAD LIMA, left atrial appendage  . Mitral valve annuloplasty  01/09/13  . Aortic valve repair  01/09/13  . Right heart catheterization N/A 05/11/2014    Procedure: RIGHT HEART CATH;  Surgeon: Larey Dresser, MD;  Location: [facility] CATH LAB;  Service: Cardiovascular;  Laterality: N/A;    Current Medications: Outpatient Prescriptions Prior to Visit  Medication Sig Dispense Refill  . ADCIRCA 20 MG TABS TAKE 1 TABLET BY MOUTH ONCE DAILY 30 tablet 6  . aspirin 81 MG chewable tablet Chew 81 mg by mouth daily.    . bisoprolol (ZEBETA) 5 MG tablet Take 0.5 tablets (2.5 mg total) by mouth daily. 15 tablet 6  . digoxin (LANOXIN) 0.125 MG tablet TAKE 1 TABLET BY MOUTH ONCE DAILY 90 tablet 3  . eplerenone (INSPRA) 25 MG tablet TAKE 1 TABLET BY MOUTH ONCE DAILY 30 tablet 6  . furosemide (LASIX) 20 MG tablet Take 60 mg by mouth daily.    . Magnesium 100 MG CAPS Take 1 capsule by mouth 3 (three) times daily.     . Multiple Vitamins-Minerals (MULTIVITAMIN WITH MINERALS) tablet Take 1 tablet  by mouth daily.    . Omega-3 Fatty Acids (THE VERY FINEST FISH OIL) LIQD Take 2-3 g by mouth daily.     . potassium chloride (K-DUR) 10 MEQ tablet Take 10 mEq by mouth every other day.    . valsartan (DIOVAN) 40 MG tablet Take 1 tablet every morning and 2 tablets every evening. 90 tablet 3  . Zinc 100 MG TABS Take 1 tablet by mouth daily.      No facility-administered medications prior to visit.     Allergies:   Carvedilol; Dabigatran; Pradaxa; Sulfonamide derivatives; and Xarelto   Social History   Social History  . Marital Status: Married    Spouse Name: N/A  . Number of Children: N/A  . Years of Education: N/A   Occupational History  . PHYSICIAN     Psychologist   Social History Main Topics  . Smoking status: Never Smoker     . Smokeless tobacco: Never Used  . Alcohol Use: No  . Drug Use: No  . Sexual Activity: Not Asked   Other Topics Concern  . None   Social History Narrative   Married   Gets regular exercise     Family History:  The patient's family history is negative for Heart disease and Hypertension. He was adopted.   ROS:   Please see the history of present illness.    ROS All other systems reviewed and are negative.   PHYSICAL EXAM:   VS:  BP 136/56 mmHg  Pulse 56  Ht _0  (1.778 m)  Wt 153 lb 12.8 oz (69.763 kg)  BMI 22.07 kg/m2  SpO2 98%   GEN: Well nourished, well developed, in no acute distress HEENT: normal Neck: no JVD, carotid bruits, or masses Cardiac: RRR; no murmurs, rubs, or gallops,no edema.  Intact distal pulses bilaterally.  Respiratory:  clear to auscultation bilaterally, normal work of breathing GI: soft, nontender, nondistended, + BS MS: no deformity or atrophy Skin: warm and dry, no rash Neuro:  Alert and Oriented x 3, Strength and sensation are intact Psych: euthymic mood, full affect  Wt Readings from Last 3 Encounters:  09/05/15 153 lb 12.8 oz (69.763 kg)  08/05/15 156 lb 12 oz (71.101 kg)  06/27/15 153 lb (69.4 kg)      Studies/Labs Reviewed:   EKG:  EKG is not ordered today.   Recent Labs: 12/05/2014: TSH 2.043 03/26/2015: Pro B Natriuretic peptide (BNP) 2065.00* 04/01/2015: Magnesium 2.2 08/16/2015: B Natriuretic Peptide 317.0*; BUN 20; Creatinine, Ser 1.08; Hemoglobin 13.1; Platelets 152; Potassium 4.3; Sodium 136   Lipid Panel    Component Value Date/Time   CHOL 225* 05/11/2013 1003   TRIG 39.0 05/11/2013 1003   HDL 72.60 05/11/2013 1003   CHOLHDL 3 05/11/2013 1003   VLDL 7.8 05/11/2013 1003   LDLCALC 122* 04/19/2008 0927   LDLDIRECT 143.9 05/11/2013 1003    Additional studies/ records that were reviewed today include:  none    ASSESSMENT:    1. OSA (obstructive sleep apnea)   2. Essential hypertension   3. Pulmonary hypertension  ([place])      PLAN:  In order of problems listed above:  1.  OSA intolerant to CPAP therapy and now using an oral device.  His last HST showed an AHI of 12.5/hr and he has had his device readjusted and has another HST pending.  He has responded well with less daytime sleepiness and no snoring. His last HST before his last adjustment showed O2 sats < 88% for  7 minutes.  I suspect that this will improve with the new adjustment.  He will bring his next HST report to the office for [place] when it is complete. 2.   HTN - BP controlled on current medical regimen. Continue BB and ARB. 3.   Pulmonary HTN - multifactorial followed by Drs. McLean and [place].    Medication Adjustments/Labs and Tests Ordered: Current medicines are reviewed at length with the patient today.  Concerns regarding medicines are outlined above.  Medication changes, Labs and Tests ordered today are listed in the Patient Instructions below.  There are no Patient Instructions on file for this visit.   Signed, Fransico Him, MD  09/05/2015 11:28 AM    [address] [place], [place],   [address] Phone: [phone]; Fax: [phone]

## 2015-09-10 DIAGNOSIS — H43813 Vitreous degeneration, bilateral: Secondary | ICD-10-CM | POA: Diagnosis not present

## 2015-09-10 DIAGNOSIS — H2513 Age-related nuclear cataract, bilateral: Secondary | ICD-10-CM | POA: Diagnosis not present

## 2015-09-10 DIAGNOSIS — H353130 Nonexudative age-related macular degeneration, bilateral, stage unspecified: Secondary | ICD-10-CM | POA: Diagnosis not present

## 2015-09-12 ENCOUNTER — Encounter: Payer: Self-pay | Admitting: Cardiology

## 2015-09-16 MED FILL — ADCIRCA 20 MG TABLET: 20 | 30 days supply | Qty: 30 | Fill #1

## 2015-09-17 ENCOUNTER — Other Ambulatory Visit (HOSPITAL_COMMUNITY): Payer: Self-pay | Admitting: Internal Medicine

## 2015-09-17 MED FILL — POTASSIUM CL ER 10 MEQ TAB: 10 | 30 days supply | Qty: 15 | Fill #0

## 2015-09-17 MED FILL — BISOPROLOL FUMARATE 5 MG TA: 5 | 30 days supply | Qty: 15 | Fill #3

## 2015-09-25 ENCOUNTER — Other Ambulatory Visit (HOSPITAL_COMMUNITY): Payer: Self-pay | Admitting: Cardiology

## 2015-09-25 MED FILL — VALSARTAN 40 MG TABLET: 40 | 30 days supply | Qty: 90 | Fill #0

## 2015-09-25 MED FILL — FUROSEMIDE 40 MG TABLET: 40 | 30 days supply | Qty: 90 | Fill #5

## 2015-09-30 ENCOUNTER — Other Ambulatory Visit (HOSPITAL_COMMUNITY): Payer: Self-pay | Admitting: Cardiology

## 2015-09-30 MED FILL — EPLERENONE 25 MG TABLET: 25 | 30 days supply | Qty: 30 | Fill #0

## 2015-10-02 DIAGNOSIS — H579 Unspecified disorder of eye and adnexa: Secondary | ICD-10-CM | POA: Diagnosis not present

## 2015-10-02 DIAGNOSIS — H16141 Punctate keratitis, right eye: Secondary | ICD-10-CM | POA: Diagnosis not present

## 2015-10-14 MED FILL — ADCIRCA 20 MG TABLET: 20 | 30 days supply | Qty: 30 | Fill #2

## 2015-10-14 MED FILL — POTASSIUM CL ER 10 MEQ TAB: 10 | 30 days supply | Qty: 15 | Fill #1

## 2015-10-14 MED FILL — BISOPROLOL FUMARATE 5 MG TA: 5 | 30 days supply | Qty: 15 | Fill #4

## 2015-10-31 MED FILL — EPLERENONE 25 MG TABLET: 25 | 30 days supply | Qty: 30 | Fill #1

## 2015-11-06 ENCOUNTER — Ambulatory Visit (HOSPITAL_COMMUNITY)
Admission: RE | Admit: 2015-11-06 | Discharge: 2015-11-06 | Disposition: A | Discharge status: Home / Self Care | Payer: Medicare Other | Source: Ambulatory Visit | Attending: Cardiology | Admitting: Cardiology

## 2015-11-06 ENCOUNTER — Encounter (HOSPITAL_COMMUNITY): Payer: Self-pay

## 2015-11-06 VITALS — BP 128/62 | HR 61 | Wt 156.8 lb

## 2015-11-06 DIAGNOSIS — Z79899 Other long term (current) drug therapy: Secondary | ICD-10-CM | POA: Diagnosis not present

## 2015-11-06 DIAGNOSIS — I872 Venous insufficiency (chronic) (peripheral): Secondary | ICD-10-CM | POA: Insufficient documentation

## 2015-11-06 DIAGNOSIS — G629 Polyneuropathy, unspecified: Secondary | ICD-10-CM | POA: Diagnosis not present

## 2015-11-06 DIAGNOSIS — I110 Hypertensive heart disease with heart failure: Secondary | ICD-10-CM | POA: Insufficient documentation

## 2015-11-06 DIAGNOSIS — I5022 Chronic systolic (congestive) heart failure: Secondary | ICD-10-CM | POA: Diagnosis not present

## 2015-11-06 DIAGNOSIS — E785 Hyperlipidemia, unspecified: Secondary | ICD-10-CM | POA: Insufficient documentation

## 2015-11-06 DIAGNOSIS — Z8673 Personal history of transient ischemic attack (TIA), and cerebral infarction without residual deficits: Secondary | ICD-10-CM | POA: Insufficient documentation

## 2015-11-06 DIAGNOSIS — I2510 Atherosclerotic heart disease of native coronary artery without angina pectoris: Secondary | ICD-10-CM | POA: Diagnosis not present

## 2015-11-06 DIAGNOSIS — I272 Other secondary pulmonary hypertension: Secondary | ICD-10-CM | POA: Insufficient documentation

## 2015-11-06 DIAGNOSIS — Z8249 Family history of ischemic heart disease and other diseases of the circulatory system: Secondary | ICD-10-CM | POA: Insufficient documentation

## 2015-11-06 DIAGNOSIS — Z9889 Other specified postprocedural states: Secondary | ICD-10-CM

## 2015-11-06 DIAGNOSIS — I255 Ischemic cardiomyopathy: Secondary | ICD-10-CM | POA: Insufficient documentation

## 2015-11-06 DIAGNOSIS — Z951 Presence of aortocoronary bypass graft: Secondary | ICD-10-CM | POA: Diagnosis not present

## 2015-11-06 DIAGNOSIS — K219 Gastro-esophageal reflux disease without esophagitis: Secondary | ICD-10-CM | POA: Diagnosis not present

## 2015-11-06 DIAGNOSIS — G4733 Obstructive sleep apnea (adult) (pediatric): Secondary | ICD-10-CM | POA: Insufficient documentation

## 2015-11-06 DIAGNOSIS — I4891 Unspecified atrial fibrillation: Secondary | ICD-10-CM | POA: Insufficient documentation

## 2015-11-06 DIAGNOSIS — Z7982 Long term (current) use of aspirin: Secondary | ICD-10-CM | POA: Insufficient documentation

## 2015-11-06 DIAGNOSIS — I6523 Occlusion and stenosis of bilateral carotid arteries: Secondary | ICD-10-CM | POA: Insufficient documentation

## 2015-11-06 DIAGNOSIS — I351 Nonrheumatic aortic (valve) insufficiency: Secondary | ICD-10-CM | POA: Diagnosis not present

## 2015-11-06 DIAGNOSIS — I252 Old myocardial infarction: Secondary | ICD-10-CM | POA: Diagnosis not present

## 2015-11-06 LAB — DIGOXIN LEVEL: DIGOXIN LVL: 0.5 ng/mL — AB (ref 0.8–2.0)

## 2015-11-06 LAB — BRAIN NATRIURETIC PEPTIDE: B NATRIURETIC PEPTIDE 5: 391.7 pg/mL — AB (ref 0.0–100.0)

## 2015-11-06 LAB — BASIC METABOLIC PANEL
ANION GAP: 8 (ref 5–15)
BUN: 18 mg/dL (ref 6–20)
CHLORIDE: 98 mmol/L — AB (ref 101–111)
CO2: 26 mmol/L (ref 22–32)
Calcium: 9.7 mg/dL (ref 8.9–10.3)
Creatinine, Ser: 0.99 mg/dL (ref 0.61–1.24)
Glucose, Bld: 102 mg/dL — ABNORMAL HIGH (ref 65–99)
POTASSIUM: 4.6 mmol/L (ref 3.5–5.1)
SODIUM: 132 mmol/L — AB (ref 135–145)

## 2015-11-06 NOTE — Patient Instructions (Signed)
Routine lab work today. Will notify you of abnormal results  Follow up in 3 months with Dr.McLean

## 2015-11-07 LAB — HIGH SENSITIVITY CRP: CRP, High Sensitivity: 3.46 mg/L — ABNORMAL HIGH (ref 0.00–3.00)

## 2015-11-07 LAB — HOMOCYSTEINE: Homocysteine: 15.6 umol/L — ABNORMAL HIGH (ref 0.0–15.0)

## 2015-11-07 LAB — TESTOSTERONE: Testosterone: 592 ng/dL (ref 264–916)

## 2015-11-07 MED FILL — FUROSEMIDE 40 MG TABLET: 40 | 30 days supply | Qty: 90 | Fill #6

## 2015-11-07 MED FILL — VALSARTAN 40 MG TABLET: 40 | 30 days supply | Qty: 90 | Fill #1

## 2015-11-07 NOTE — Progress Notes (Signed)
Patient ID: Caleb Moment, PhD, male   DOB: 1940/09/16, 75 y.o.   MRN: [id number]    Advanced Heart Failure Clinic Note   PCP: Dr. Sherren Mocha EP: [name] Cardiology: [name]  75 yo with complex past history presents for heart failure evaluation.  Patient had anterior MI in 2004 and developed ischemic cardiomyopathy as well as mitral regurgitation. He also developed atrial fibrillation.  In 10/14, he had MV repair, Maze, CABG with LIMA-LAD, and LA appendage closure at [facility] in [place].  Subsequently, atrial fibrillation returned and he had an atrial fibrillation ablation in [facility] in 3/15.  He wore a Zio patch in 12/15 and had a low atrial fibrillation burden of 7%.  He has had a long-standing ischemic cardiomyopathy.  In 2015, EF was 20-25%.  Echo in 2016 also showed EF 25-30% but estimated PA pressure suggested severe pulmonary hypertension, which is new for him.    At initial appointment, he reported increased exertional dyspnea over a number of weeks.  RHC was done, showing mildly elevated PCWP with moderate pulmonary HTN and low cardiac index (1.93 thermo, 2.13 Fick).  V/Q scan showed no PE.  I started him on digoxin and have titrated his Lasix to 80 mg daily.  He is now on bisoprolol and able to tolerate it.  At last appointment, I had him try replacing valsartan with Entresto 24/26 bid.  He was unable to tolerate it (made him dizzy, though BP was not low).  Therefore, I had him restart valsartan.  CPX in 4/16 showed mildly decreased functional capacity.  He has tolerated Adcirca 20 mg daily.  He did not tolerate an attempt to uptitrate bisoprolol.  He tried CPAP but was unable to tolerate it.  Last echo in 1/17 showed EF 30-35%, stable MV repair, mild AI, but moderately dilated RV with mildly decreased systolic function and PA systolic pressure 69 mmHg.   He is doing well today.  No exertional dyspnea or chest pain.  He is walking 30-45 minutes daily.  No orthopnea/PND.  He is now  using a mouthpiece for sleep apnea and is feeling more rested.  He has occasional transient palpitations.  He is in NSR today.  Weight is stable.   6 minute walk (3/16): 381 m  6 minute walk (5/16): 414.5 m 6 minute walk (10/16): 562 m 6 minute walk (1/17): 469 m  Labs (2/15): LDL 144 Labs (8/15): K 4.6, creatinine 0.9 Labs (12/15): HCT 42.3   Labs (2/16): K 4 => 4.2, creatinine 1.05 => 0.92, BNP 268 Labs (3/16): BNP 495 => 320, digoxin 0.4, RF 14.7 (very mild increase), TSH normal, HIV negative, anti-SCL70 negative, creatinine 0.91, K 4.4 Labs (7/16): K 5, creatinine 1.05, BNP 455, vitamin D normal, digoxin 0.4, B12 normal Labs (8/16): digoxin 0.8, BNP 305, K 4.2, creatinine 0.99 Labs (9/16): HCT 43.3, TSH normal, BNP 492 => 278, K 4.3, creatinine 0.97 => 1.04, digoxin 0.6, TSH normal, LDL 154, LDL-P 1654.  Labs (10/16): K 4.7, creatinine 1.1 Labs (1/17): pro-BNP 2065, digoxin 0.3, K 4.3, creatinine 1.10 => 1.28 Labs (3/17): K 4.1, creatinine 1.09, HCT 38.3 Labs (4/17): K 4.4, creatinine 0.99, digoxin 0.8 Labs (5/17): K 4.3, creatinine 1.08, BNP 317, hgb 13.1  PMH: 1. CAD: Anterior MI in 2004.  Cardiac surgery in 10/14 included LIMA-LAD.  2. Chronic mitral regurgitation: 10/14 surgery at [facility] with MV repair, Maze, LIMA-LAD, and LA appendage closure.  3. Atrial fibrillation: Paroxysmal.  He was initially on Tikosyn  but had breakthrough atrial fibrillation.  H/o Maze in 2014.  Had recurrent atrial fibrillation with ablation in 3/15 by Dr Ola Spurr in [facility].  Not anticoagulated after 2 severe prostate bleeding episodes.  LA appendage was clipped with MV surgery.  - Zio patch in 12/15 with low atrial fibrillation burden (7%).  - Holter (9/16) with PACs, PVCs, short atrial fibrillation runs (nothing sustained). 4. Chronotropic incompetence.  5. HTN 6. Hyperlipidemia: Refuses statin.  7. Peripheral neuropathy 8. GERD 9. H/o BPPV 10. H/o TIA 11. Ischemic cardiomyopathy:  cardiac MRI 7/14 with EF 35%, moderate MR, normal RV size and systolic function, extensive anterior and anteroseptal LGE suggestive of non-viable myocardium (this was prior to LIMA-LAD).  Echo 1/15 with EF 20-25%, moderate AI, PA systolic pressure 39 mmHg. Echo (1/16) with EF 20-25%, diffuse hypokinesis with regionality, moderate LV dilation, moderate AI, s/p MV repair with mild MR and normal gradients, RV dilated with mildly decreased systolic function, PA systolic pressure 71 mmHg.   - RHC (2/16) with mean RA 6, PA 61/25 mean 40, mean PCWP 23, CI 2.13/PVR 4.3 (Fick), CI 1.93/PVR 4.7 (thermo).   - CPX (4/16) with peak VO2 17.9, VE/VCO2 34.7 => mildly decreased functional capacity.  - Spironolactone apparently caused cognitive deficits - Intolerant of Coreg due to development of severe alopecia - Unable to uptitrate bisoprolol due to intolerance.  - Lightheaded with Entresto.  - Unable to tolerate increase in valsartan to 80 mg bid.  - Echo (1/17) with EF 30-35%, moderate LV dilation, mild AI, s/p MV repair with mild MR, moderately dilated RV with mildly decreased systolic function, PA systolic pressure 69 mmHg.  - CPX (4/17): peak VO2 18, VE/VCO2 slope 33, RER 1.28 => mild to moderate functional impairment, mildly improved.  12. Carotid stenosis: Carotid dopplers (1/16) with 40-59% bilateral ICA stenosis. Carotid dopplers (4/17) with 40-59% BICA stenosis.  13. Aortic insufficiency: Moderate by last echo 2/16.  14. Pulmonary HTN: Mixed PAH and pulmonary venous hypertension.  PFTs (9/14) were normal.  V/Q scan (2/16) with no evidence of acute or chronic PE.  6 minute walk (3/16) 381 m. 6 minute walk (5/16) 414.5 m. 6 minute walk (1/17) 469 m.  15. OSA: Moderate on 5/16 sleep study. Unable to tolerate CPAP.  16. Venous insufficiency  SH: Married, lives in [place], Engineer, water, nonsmoker  FH: CAD  ROS: All systems reviewed and negative except as per HPI.   Current Outpatient Prescriptions    Medication Sig Dispense Refill  . ADCIRCA 20 MG TABS TAKE 1 TABLET BY MOUTH ONCE DAILY 30 tablet 6  . aspirin 81 MG chewable tablet Chew 81 mg by mouth daily.    . bisoprolol (ZEBETA) 5 MG tablet Take 0.5 tablets (2.5 mg total) by mouth daily. 15 tablet 6  . digoxin (LANOXIN) 0.125 MG tablet TAKE 1 TABLET BY MOUTH ONCE DAILY 90 tablet 3  . eplerenone (INSPRA) 25 MG tablet TAKE 1 TABLET BY MOUTH ONCE DAILY 30 tablet 6  . furosemide (LASIX) 20 MG tablet Take 60 mg by mouth daily.    . Magnesium 100 MG CAPS Take 1 capsule by mouth 3 (three) times daily.     . Multiple Vitamins-Minerals (MULTIVITAMIN WITH MINERALS) tablet Take 1 tablet by mouth daily.    . Omega-3 Fatty Acids (THE VERY FINEST FISH OIL) LIQD Take 2-3 g by mouth daily.     . potassium chloride (K-DUR) 10 MEQ tablet Take 10 mEq by mouth every other day.    . valsartan (  DIOVAN) 40 MG tablet TAKE 1 TABLET BY MOUTH EVERY MORNING AND 2 TABLETS EVERY EVENING 90 tablet 3  . Zinc 100 MG TABS Take 1 tablet by mouth daily.      No current facility-administered medications for this encounter.    BP 128/62   Pulse 61   Wt 156 lb 12 oz (71.1 kg)   SpO2 97%   BMI 22.49 kg/m    Wt Readings from Last 3 Encounters:  11/06/15 156 lb 12 oz (71.1 kg)  09/05/15 153 lb 12.8 oz (69.8 kg)  08/05/15 156 lb 12 oz (71.1 kg)     General: NAD Neck: No JVD, no thyromegaly or thyroid nodule.  Lungs: Clear to auscultation bilaterally with normal respiratory effort. CV: Lateral PMI.  Heart regular S1/S2, no S3/S4, 2/6 HSM LLSB.  1+ left ankle edema, trace right ankle edema.  No carotid bruit.  Normal pedal pulses.  Abdomen: Soft, NT, ND, no HSM. No bruits or masses. +BS  Neurologic: Alert and oriented x 3.  Psych: Normal affect. Extremities: No clubbing or cyanosis.  HEENT: Normal.   Assessment/Plan: 1. CAD: This seems stable, no chest pain. S/p LIMA-LAD.  He is on ASA 81.  He has decided not to take statins after reviewing the data (I did  recommend taking a statin but we have agreed to disagree on this).   2. S/p mitral valve repair: The MV repair looked stable on 1/17 echo with mild MR and no evidence for significant mitral stenosis.  3. Aortic insufficiency: Mild on 1/17 echo, improved from prior.  4. Chronic systolic CHF: Ischemic cardiomyopathy, EF 30-35% on 1/17 echo.  PA pressure remains elevated by doppler measurement and the RV is moderately dilated with mildly decreased systolic function.  Mildly decreased functional capacity on 4/16 CPX, this was mildly improved on 4/17 CPX.  NYHA class II. He does not appear volume overloaded today.  - Continue Lasix 40 mg qam/20 qpm.  BMET/BNP today.  - Continue digoxin, check level today.   - Continue bisoprolol 2.5 mg daily (unable to increase).   - Continue valsartan 40 qam/80 qpm.  He did not tolerate Entresto or further uptitration of valsartan.    - Continue eplerenone.      - He does not want an ICD and understands the reasoning for having one.  - I have talked to him about the BEAT-HF and [employer] trials.  He wants to think about them. - In the future, he could be an LVAD candidate if he worsens.  5. Atrial fibrillation: s/p Maze in 10/14, then ablation in 3/15.  Prior to Maze, he was on Tikosyn but had breakthrough.  Currently, he is not anticoagulated due to history of prostate bleeding.  He did have his LA appendage clipped at time of MV surgery.  NSR today, occasional palpitations that may represent transient atrial fibrillation.  - He remains reluctant to be anticoagulated. - Continue bisoprolol.  6. Pulmonary hypertension: Moderate to severe by most recent echo in 1/17, moderate by last RHC.  The RV was moderately dilated and mildly dysfunction on most recent echo.  Mixed pulmonary venous and pulmonary arterial HTN.  It is possible that the [facility] component is due to pulmonary vascular remodeling in the setting of chronic mitral regurgitation prior to MV repair. Negative V/Q  scan, no evidence for CTEPH.  PFTs normal in 9/14.  He is seeing Dr Lake Bells.  Sleep study showed moderate OSA but he has been unable to tolerated CPAP.  He  has started on Adcirca and felt like it helped.   - Have recommended he increase Adcirca to 40 mg daily.  He wants to keep dose at 20 mg daily for now.  - Will need 6 minute walk at next appointment.  7. OSA: Moderate.  Has not tolerated CPAP. Probably plays a role in recurrent atrial fibrillation.  He now uses and mouthpiece at night and feels like this helps.  8. Carotid stenosis: Stable carotid dopplers, repeat in 4/18.    Loralie Champagne 11/07/2015

## 2015-11-08 ENCOUNTER — Encounter (HOSPITAL_COMMUNITY): Payer: Self-pay | Admitting: *Deleted

## 2015-11-13 MED FILL — ADCIRCA 20 MG TABLET: 20 | 30 days supply | Qty: 30 | Fill #3

## 2015-11-15 ENCOUNTER — Telehealth (HOSPITAL_COMMUNITY): Payer: Self-pay | Admitting: Vascular Surgery

## 2015-11-15 NOTE — Telephone Encounter (Signed)
Left message to call back

## 2015-11-15 NOTE — Telephone Encounter (Signed)
Pt called he needs to speak to DR. Mclean

## 2015-11-18 MED FILL — BISOPROLOL FUMARATE 5 MG TA: 5 | 30 days supply | Qty: 15 | Fill #5

## 2015-11-18 NOTE — Telephone Encounter (Signed)
Can make him appointment in next week or two.

## 2015-11-18 NOTE — Telephone Encounter (Signed)
Spoke w/pt, he states he has "fluid pockets below sternum or tumor" he would like to discuss this matter and some other issues with Dr Aundra Dubin and is requesting another appt.  Will discuss w/Dr Aundra Dubin and call him back tomorrow

## 2015-11-20 NOTE — Telephone Encounter (Signed)
Attempted to call pt with no answer

## 2015-11-21 NOTE — Telephone Encounter (Signed)
Spoke with patient regarding previous triage calls of fluid overload and Dr. Claris Gladden recommendation to be seen in 1-2 weeks in CHF clinic. Added patient on to next week's available opening on the 6th at 9:40 am. Patient knows to call CHF clinic with any worsening s/s.  Renee Pain, RN

## 2015-11-22 ENCOUNTER — Telehealth (HOSPITAL_COMMUNITY): Payer: Self-pay | Admitting: *Deleted

## 2015-11-22 MED ORDER — NITROGLYCERIN 0.4 MG SL SUBL: 0.4000 mg | SUBLINGUAL_TABLET | SUBLINGUAL | 3 refills | Status: DC | PRN

## 2015-11-22 NOTE — Telephone Encounter (Signed)
Pt called to report he has chest pain/discomfort yesterday.  He states it was in the left upper chest area, a sharpe/jabbing pain, it only lasted a sec or 2 then resolved.  He states this occurred twice while he was walking yesterday and then late yesterday evening while he was in the bathroom.  He denied sob.  Discussed w/Dr Aundra Dubin, he recommends pt monitor symptoms for now as they were brief and resolved on its own.    Pt aware and agreeable, he is sch for f/u 9/6 and will keep that appt, he states his ntg tabs are expired and request a refill.

## 2015-11-27 ENCOUNTER — Encounter (HOSPITAL_COMMUNITY): Payer: Self-pay

## 2015-11-27 ENCOUNTER — Ambulatory Visit (HOSPITAL_COMMUNITY)
Admission: RE | Admit: 2015-11-27 | Discharge: 2015-11-27 | Disposition: A | Discharge status: Home / Self Care | Payer: Medicare Other | Source: Ambulatory Visit | Attending: Cardiology | Admitting: Cardiology

## 2015-11-27 VITALS — BP 112/60 | HR 62 | Wt 157.0 lb

## 2015-11-27 DIAGNOSIS — Z8673 Personal history of transient ischemic attack (TIA), and cerebral infarction without residual deficits: Secondary | ICD-10-CM | POA: Diagnosis not present

## 2015-11-27 DIAGNOSIS — Z8249 Family history of ischemic heart disease and other diseases of the circulatory system: Secondary | ICD-10-CM | POA: Diagnosis not present

## 2015-11-27 DIAGNOSIS — I252 Old myocardial infarction: Secondary | ICD-10-CM | POA: Diagnosis not present

## 2015-11-27 DIAGNOSIS — I110 Hypertensive heart disease with heart failure: Secondary | ICD-10-CM | POA: Insufficient documentation

## 2015-11-27 DIAGNOSIS — I5022 Chronic systolic (congestive) heart failure: Secondary | ICD-10-CM | POA: Diagnosis not present

## 2015-11-27 DIAGNOSIS — I872 Venous insufficiency (chronic) (peripheral): Secondary | ICD-10-CM | POA: Diagnosis not present

## 2015-11-27 DIAGNOSIS — I482 Chronic atrial fibrillation, unspecified: Secondary | ICD-10-CM

## 2015-11-27 DIAGNOSIS — I480 Paroxysmal atrial fibrillation: Secondary | ICD-10-CM | POA: Diagnosis not present

## 2015-11-27 DIAGNOSIS — I255 Ischemic cardiomyopathy: Secondary | ICD-10-CM | POA: Diagnosis not present

## 2015-11-27 DIAGNOSIS — Z951 Presence of aortocoronary bypass graft: Secondary | ICD-10-CM

## 2015-11-27 DIAGNOSIS — G4733 Obstructive sleep apnea (adult) (pediatric): Secondary | ICD-10-CM | POA: Diagnosis not present

## 2015-11-27 DIAGNOSIS — I2510 Atherosclerotic heart disease of native coronary artery without angina pectoris: Secondary | ICD-10-CM | POA: Insufficient documentation

## 2015-11-27 DIAGNOSIS — R002 Palpitations: Secondary | ICD-10-CM | POA: Diagnosis not present

## 2015-11-27 DIAGNOSIS — K439 Ventral hernia without obstruction or gangrene: Secondary | ICD-10-CM | POA: Insufficient documentation

## 2015-11-27 DIAGNOSIS — Z7982 Long term (current) use of aspirin: Secondary | ICD-10-CM | POA: Diagnosis not present

## 2015-11-27 DIAGNOSIS — I340 Nonrheumatic mitral (valve) insufficiency: Secondary | ICD-10-CM | POA: Insufficient documentation

## 2015-11-27 DIAGNOSIS — G629 Polyneuropathy, unspecified: Secondary | ICD-10-CM | POA: Insufficient documentation

## 2015-11-27 DIAGNOSIS — Z9889 Other specified postprocedural states: Secondary | ICD-10-CM

## 2015-11-27 DIAGNOSIS — E785 Hyperlipidemia, unspecified: Secondary | ICD-10-CM | POA: Diagnosis not present

## 2015-11-27 DIAGNOSIS — I272 Other secondary pulmonary hypertension: Secondary | ICD-10-CM | POA: Diagnosis not present

## 2015-11-27 DIAGNOSIS — R0789 Other chest pain: Secondary | ICD-10-CM | POA: Diagnosis not present

## 2015-11-27 DIAGNOSIS — K219 Gastro-esophageal reflux disease without esophagitis: Secondary | ICD-10-CM | POA: Diagnosis not present

## 2015-11-27 DIAGNOSIS — I6523 Occlusion and stenosis of bilateral carotid arteries: Secondary | ICD-10-CM | POA: Diagnosis not present

## 2015-11-27 DIAGNOSIS — I10 Essential (primary) hypertension: Secondary | ICD-10-CM

## 2015-11-27 MED ORDER — EPLERENONE 50 MG PO TABS: 50.0000 mg | ORAL_TABLET | Freq: Every day | ORAL | 6 refills | Status: DC

## 2015-11-27 NOTE — Patient Instructions (Signed)
Increase Epleronone to 50 mg daily  Stop Potassium  Labs in 1 week  Your physician has requested that you have an echocardiogram. Echocardiography is a painless test that uses sound waves to create images of your heart. It provides your doctor with information about the size and shape of your heart and how well your heart's chambers and valves are working. This procedure takes approximately one hour. There are no restrictions for this procedure.  Your physician has requested that you have en exercise stress myoview. For further information please visit [email or web address]. Please follow instruction sheet, as given.  We will contact you in 3 months to schedule your next appointment.

## 2015-11-27 NOTE — Progress Notes (Signed)
6 min walk completed Start*Room air O2: 98% Start*Heart rate: 63  End *Room air O2 92% End * Heart rate: 100  1681f = 487.68 meters

## 2015-11-27 NOTE — Progress Notes (Signed)
Patient ID: Caleb Moment, PhD, male   DOB: January 15, 1941, 75 y.o.   MRN: [id number]    Advanced Heart Failure Clinic Note   PCP: [name] EP: [name] Cardiology: [name]  75 yo with complex past history presents for heart failure evaluation.  Patient had anterior MI in 2004 and developed ischemic cardiomyopathy as well as mitral regurgitation. He also developed atrial fibrillation.  In 10/14, he had MV repair, Maze, CABG with LIMA-LAD, and LA appendage closure at [facility] in [place].  Subsequently, atrial fibrillation returned and he had an atrial fibrillation ablation in [facility] in 3/15.  He wore a Zio patch in 12/15 and had a low atrial fibrillation burden of 7%.  He has had a long-standing ischemic cardiomyopathy.  In 2015, EF was 20-25%.  Echo in 2016 also showed EF 25-30% but estimated PA pressure suggested severe pulmonary hypertension, which is new for him.    At initial appointment, he reported increased exertional dyspnea over a number of weeks.  RHC was done, showing mildly elevated PCWP with moderate pulmonary HTN and low cardiac index (1.93 thermo, 2.13 Fick).  V/Q scan showed no PE.  I started him on digoxin and have titrated his Lasix to 80 mg daily.  He is now on bisoprolol and able to tolerate it.  At last appointment, I had him try replacing valsartan with Entresto 24/26 bid.  He was unable to tolerate it (made him dizzy, though BP was not low).  Therefore, I had him restart valsartan.  CPX in 4/16 showed mildly decreased functional capacity.  He has tolerated Adcirca 20 mg daily.  He did not tolerate an attempt to uptitrate bisoprolol.  He tried CPAP but was unable to tolerate it.  Last echo in 1/17 showed EF 30-35%, stable MV repair, mild AI, but moderately dilated RV with mildly decreased systolic function and PA systolic pressure 69 mmHg.   No exertional dyspnea.  He is walking 30-45 minutes daily.  No orthopnea/PND.  He is now using a mouthpiece for sleep apnea and  is feeling more rested.  He has occasional transient palpitations.  He is in NSR today.  Weight is stable.  Main concern today is left upper chest pain that he had about a week ago.  It was aching and lasted for about an hour.  It started while he was walking.  He has not had any chest pain since that time.    6 minute walk (3/16): 381 m  6 minute walk (5/16): 414.5 m 6 minute walk (10/16): 562 m 6 minute walk (1/17): 469 m 6 minute walk (8/17): 488 m  ECG: NSR, 1st degree AVB at 232, LAFB, IVCD 128 msec.   Labs (2/15): LDL 144 Labs (8/15): K 4.6, creatinine 0.9 Labs (12/15): HCT 42.3   Labs (2/16): K 4 => 4.2, creatinine 1.05 => 0.92, BNP 268 Labs (3/16): BNP 495 => 320, digoxin 0.4, RF 14.7 (very mild increase), TSH normal, HIV negative, anti-SCL70 negative, creatinine 0.91, K 4.4 Labs (7/16): K 5, creatinine 1.05, BNP 455, vitamin D normal, digoxin 0.4, B12 normal Labs (8/16): digoxin 0.8, BNP 305, K 4.2, creatinine 0.99 Labs (9/16): HCT 43.3, TSH normal, BNP 492 => 278, K 4.3, creatinine 0.97 => 1.04, digoxin 0.6, TSH normal, LDL 154, LDL-P 1654.  Labs (10/16): K 4.7, creatinine 1.1 Labs (1/17): pro-BNP 2065, digoxin 0.3, K 4.3, creatinine 1.10 => 1.28 Labs (3/17): K 4.1, creatinine 1.09, HCT 38.3 Labs (4/17): K 4.4, creatinine 0.99, digoxin  0.8 Labs (5/17): K 4.3, creatinine 1.08, BNP 317, hgb 13.1 Labs (8/17): K 4.6, creatinine 0.99, BNP 392, digoxin 0.5  PMH: 1. CAD: Anterior MI in 2004.  Cardiac surgery in 10/14 included LIMA-LAD.  2. Chronic mitral regurgitation: 10/14 surgery at [facility] with MV repair, Maze, LIMA-LAD, and LA appendage closure.  3. Atrial fibrillation: Paroxysmal.  He was initially on Tikosyn but had breakthrough atrial fibrillation.  H/o Maze in 2014.  Had recurrent atrial fibrillation with ablation in 3/15 by Dr Ola Spurr in [facility].  Not anticoagulated after 2 severe prostate bleeding episodes.  LA appendage was clipped with MV surgery.  - Zio patch  in 12/15 with low atrial fibrillation burden (7%).  - Holter (9/16) with PACs, PVCs, short atrial fibrillation runs (nothing sustained). 4. Chronotropic incompetence.  5. HTN 6. Hyperlipidemia: Refuses statin.  7. Peripheral neuropathy 8. GERD 9. H/o BPPV 10. H/o TIA 11. Ischemic cardiomyopathy: cardiac MRI 7/14 with EF 35%, moderate MR, normal RV size and systolic function, extensive anterior and anteroseptal LGE suggestive of non-viable myocardium (this was prior to LIMA-LAD).  Echo 1/15 with EF 20-25%, moderate AI, PA systolic pressure 39 mmHg. Echo (1/16) with EF 20-25%, diffuse hypokinesis with regionality, moderate LV dilation, moderate AI, s/p MV repair with mild MR and normal gradients, RV dilated with mildly decreased systolic function, PA systolic pressure 71 mmHg.   - RHC (2/16) with mean RA 6, PA 61/25 mean 40, mean PCWP 23, CI 2.13/PVR 4.3 (Fick), CI 1.93/PVR 4.7 (thermo).   - CPX (4/16) with peak VO2 17.9, VE/VCO2 34.7 => mildly decreased functional capacity.  - Spironolactone apparently caused cognitive deficits - Intolerant of Coreg due to development of severe alopecia - Unable to uptitrate bisoprolol due to intolerance.  - Lightheaded with Entresto.  - Unable to tolerate increase in valsartan to 80 mg bid.  - Echo (1/17) with EF 30-35%, moderate LV dilation, mild AI, s/p MV repair with mild MR, moderately dilated RV with mildly decreased systolic function, PA systolic pressure 69 mmHg.  - CPX (4/17): peak VO2 18, VE/VCO2 slope 33, RER 1.28 => mild to moderate functional impairment, mildly improved.  12. Carotid stenosis: Carotid dopplers (1/16) with 40-59% bilateral ICA stenosis. Carotid dopplers (4/17) with 40-59% BICA stenosis.  13. Aortic insufficiency: Moderate by last echo 2/16.  14. Pulmonary HTN: Mixed PAH and pulmonary venous hypertension.  PFTs (9/14) were normal.  V/Q scan (2/16) with no evidence of acute or chronic PE.  6 minute walk (3/16) 381 m. 6 minute walk  (5/16) 414.5 m. 6 minute walk (1/17) 469 m.  15. OSA: Moderate on 5/16 sleep study. Unable to tolerate CPAP.  16. Venous insufficiency 17. Ventral hernia  SH: Married, lives in [place], Engineer, water, nonsmoker  FH: CAD  ROS: All systems reviewed and negative except as per HPI.   Current Outpatient Prescriptions  Medication Sig Dispense Refill  . ADCIRCA 20 MG TABS TAKE 1 TABLET BY MOUTH ONCE DAILY 30 tablet 6  . aspirin 81 MG chewable tablet Chew 81 mg by mouth daily.    . bisoprolol (ZEBETA) 5 MG tablet Take 0.5 tablets (2.5 mg total) by mouth daily. 15 tablet 6  . digoxin (LANOXIN) 0.125 MG tablet TAKE 1 TABLET BY MOUTH ONCE DAILY 90 tablet 3  . eplerenone (INSPRA) 50 MG tablet Take 1 tablet (50 mg total) by mouth daily. 30 tablet 6  . furosemide (LASIX) 20 MG tablet Take 60 mg by mouth daily.    . Magnesium 100 MG CAPS  Take 1 capsule by mouth 3 (three) times daily.     . Multiple Vitamins-Minerals (MULTIVITAMIN WITH MINERALS) tablet Take 1 tablet by mouth daily.    . Omega-3 Fatty Acids (THE VERY FINEST FISH OIL) LIQD Take 2-3 g by mouth daily.     . valsartan (DIOVAN) 40 MG tablet TAKE 1 TABLET BY MOUTH EVERY MORNING AND 2 TABLETS EVERY EVENING 90 tablet 3  . Zinc 100 MG TABS Take 1 tablet by mouth daily.     . nitroGLYCERIN (NITROSTAT) 0.4 MG SL tablet Place 1 tablet (0.4 mg total) under the tongue every 5 (five) minutes as needed for chest pain. (Patient not taking: Reported on 11/27/2015) 25 tablet 3   No current facility-administered medications for this encounter.    BP 112/60   Pulse 62   Wt 157 lb (71.2 kg)   SpO2 98%   BMI 22.53 kg/m    Wt Readings from Last 3 Encounters:  11/27/15 157 lb (71.2 kg)  11/06/15 156 lb 12 oz (71.1 kg)  09/05/15 153 lb 12.8 oz (69.8 kg)     General: NAD Neck: No JVD, no thyromegaly or thyroid nodule.  Lungs: Clear to auscultation bilaterally with normal respiratory effort. CV: Lateral PMI.  Heart regular S1/S2, no S3/S4, 2/6 HSM  LLSB.  1+ left ankle edema, no right ankle edema.  No carotid bruit.  Normal pedal pulses.  Abdomen: Soft, NT, ND, no HSM. No bruits or masses. +BS.  Small ventral hernia, reducible.  Neurologic: Alert and oriented x 3.  Psych: Normal affect. Extremities: No clubbing or cyanosis.  HEENT: Normal.   Assessment/Plan: 1. CAD: S/p LIMA-LAD.  He is on ASA 81.  He has decided not to take statins after reviewing the data (I did recommend taking a statin but we have agreed to disagree on this).  He had an episodes of chest tightness (left upper) for about an hour last week.  It started while walking.  No recurrence since that time despite ongoing exercise.   - I will arrange for Lexiscan Cardiolite to assess for ischemia.  2. S/p mitral valve repair: The MV repair looked stable on 1/17 echo with mild MR and no evidence for significant mitral stenosis.  3. Aortic insufficiency: Mild on 1/17 echo, improved from prior.  4. Chronic systolic CHF: Ischemic cardiomyopathy, EF 30-35% on 1/17 echo.  PA pressure remains elevated by doppler measurement and the RV is moderately dilated with mildly decreased systolic function.  Mildly decreased functional capacity on 4/16 CPX, this was mildly improved on 4/17 CPX.  NYHA class II. He does not appear volume overloaded today.  - Continue Lasix 40 mg qam/20 qpm.  - Continue digoxin, recent level ok.   - Continue bisoprolol 2.5 mg daily (unable to increase).   - Continue valsartan 40 qam/80 qpm.  He did not tolerate Entresto or further uptitration of valsartan.    - Increase eplerenone to 50 mg daily with BMET in 1 week.  He can stop KCl.       - He does not want an ICD and understands the reasoning for having one.  - I have talked to him about the BEAT-HF and [employer] trials.  He wants to think about them. - In the future, he could be an LVAD candidate if he worsens.  - Repeat echo.  5. Atrial fibrillation: s/p Maze in 10/14, then ablation in 3/15.  Prior to Maze, he  was on Tikosyn but had breakthrough.  Currently, he is not  anticoagulated due to history of prostate bleeding.  He did have his LA appendage clipped at time of MV surgery.  NSR today, occasional palpitations that may represent transient atrial fibrillation.  - He remains reluctant to be anticoagulated. - Continue bisoprolol.  6. Pulmonary hypertension: Moderate to severe by most recent echo in 1/17, moderate by last RHC.  The RV was moderately dilated and mildly dysfunction on most recent echo.  Mixed pulmonary venous and pulmonary arterial HTN.  It is possible that the [facility] component is due to pulmonary vascular remodeling in the setting of chronic mitral regurgitation prior to MV repair. Negative V/Q scan, no evidence for CTEPH.  PFTs normal in 9/14.  He is seeing Dr Lake Bells.  Sleep study showed moderate OSA but he has been unable to tolerated CPAP.  He has started on Adcirca and felt like it helped.   - Have recommended he increase Adcirca to 40 mg daily.  He wants to keep dose at 20 mg daily for now.  - Good 6 minute walk distance today.  7. OSA: Moderate.  Has not tolerated CPAP. Probably plays a role in recurrent atrial fibrillation.  He now uses and mouthpiece at night and feels like this helps.  8. Carotid stenosis: Stable carotid dopplers, repeat in 4/18.    Loralie Champagne 11/27/2015

## 2015-11-28 ENCOUNTER — Other Ambulatory Visit (HOSPITAL_COMMUNITY): Payer: Self-pay | Admitting: Cardiology

## 2015-11-28 MED FILL — DIGOXIN 125 MCG TABLET: 125 | 90 days supply | Qty: 90 | Fill #2

## 2015-11-29 MED FILL — EPLERENONE 25 MG TABLET: 25 | 30 days supply | Qty: 30 | Fill #0

## 2015-12-04 ENCOUNTER — Other Ambulatory Visit (HOSPITAL_COMMUNITY): Payer: Self-pay | Admitting: *Deleted

## 2015-12-04 MED ORDER — EPLERENONE 50 MG PO TABS: 50.0000 mg | ORAL_TABLET | Freq: Every day | ORAL | 6 refills | Status: DC

## 2015-12-05 ENCOUNTER — Telehealth (HOSPITAL_COMMUNITY): Payer: Self-pay | Admitting: *Deleted

## 2015-12-05 NOTE — Telephone Encounter (Signed)
Patient given detailed instructions per Myocardial Perfusion Study Information Sheet for the test on 12/09/15. Patient notified to arrive 15 minutes early and that it is imperative to arrive on time for appointment to keep from having the test rescheduled.  If you need to cancel or reschedule your appointment, please call the office within 24 hours of your appointment. Failure to do so may result in a cancellation of your appointment, and a $50 no show fee. Patient verbalized understanding. Kirstie Peri

## 2015-12-06 ENCOUNTER — Ambulatory Visit (HOSPITAL_COMMUNITY)
Admission: RE | Admit: 2015-12-06 | Discharge: 2015-12-06 | Disposition: A | Discharge status: Home / Self Care | Payer: Medicare Other | Source: Ambulatory Visit | Attending: Cardiology | Admitting: Cardiology

## 2015-12-06 DIAGNOSIS — I5022 Chronic systolic (congestive) heart failure: Secondary | ICD-10-CM | POA: Insufficient documentation

## 2015-12-06 LAB — BASIC METABOLIC PANEL
ANION GAP: 7 (ref 5–15)
BUN: 17 mg/dL (ref 6–20)
CALCIUM: 9.4 mg/dL (ref 8.9–10.3)
CO2: 26 mmol/L (ref 22–32)
Chloride: 102 mmol/L (ref 101–111)
Creatinine, Ser: 1.06 mg/dL (ref 0.61–1.24)
GFR calc Af Amer: >60 mL/min (ref >60)
GFR calc non Af Amer: >60 mL/min (ref >60)
GLUCOSE: 126 mg/dL — AB (ref 65–99)
POTASSIUM: 4.4 mmol/L (ref 3.5–5.1)
Sodium: 135 mmol/L (ref 135–145)

## 2015-12-09 ENCOUNTER — Other Ambulatory Visit: Payer: Self-pay

## 2015-12-09 ENCOUNTER — Ambulatory Visit (HOSPITAL_BASED_OUTPATIENT_CLINIC_OR_DEPARTMENT_OTHER): Payer: Medicare Other

## 2015-12-09 ENCOUNTER — Ambulatory Visit (HOSPITAL_COMMUNITY): Payer: Medicare Other | Attending: Cardiology

## 2015-12-09 DIAGNOSIS — E785 Hyperlipidemia, unspecified: Secondary | ICD-10-CM | POA: Insufficient documentation

## 2015-12-09 DIAGNOSIS — R0789 Other chest pain: Secondary | ICD-10-CM | POA: Insufficient documentation

## 2015-12-09 DIAGNOSIS — I110 Hypertensive heart disease with heart failure: Secondary | ICD-10-CM | POA: Insufficient documentation

## 2015-12-09 DIAGNOSIS — I272 Other secondary pulmonary hypertension: Secondary | ICD-10-CM | POA: Insufficient documentation

## 2015-12-09 DIAGNOSIS — R9439 Abnormal result of other cardiovascular function study: Secondary | ICD-10-CM | POA: Insufficient documentation

## 2015-12-09 DIAGNOSIS — I5022 Chronic systolic (congestive) heart failure: Secondary | ICD-10-CM | POA: Diagnosis not present

## 2015-12-09 DIAGNOSIS — I083 Combined rheumatic disorders of mitral, aortic and tricuspid valves: Secondary | ICD-10-CM | POA: Diagnosis not present

## 2015-12-09 DIAGNOSIS — I2510 Atherosclerotic heart disease of native coronary artery without angina pectoris: Secondary | ICD-10-CM | POA: Insufficient documentation

## 2015-12-09 DIAGNOSIS — I4891 Unspecified atrial fibrillation: Secondary | ICD-10-CM | POA: Diagnosis not present

## 2015-12-09 LAB — ECHOCARDIOGRAM COMPLETE
AVLVOTPG: 4 mmHg
AVPHT: 525 ms
Ao-asc: 40 cm
CHL CUP MV DEC (S): 197
CHL CUP RV SYS PRESS: 86 mmHg
CHL CUP TV REG PEAK VELOCITY: 456 cm/s
E decel time: 197 msec
FS: 20 % — AB (ref 28–44)
IVS/LV PW RATIO, ED: 1.21
LA ID, A-P, ES: 37 mm
LA diam end sys: 37 mm
LA vol: 64 mL
LADIAMINDEX: 1.97 cm/m2
LAVOLA4C: 69 mL
LAVOLIN: 34 mL/m2
LVOT SV: 76 mL
LVOT VTI: 19.9 cm
LVOT area: 3.8 cm2
LVOT diameter: 22 mm
LVOT peak vel: 99.2 cm/s
MV Peak grad: 8 mmHg
MVPKEVEL: 141 m/s
PW: 9.45 mm — AB (ref 0.6–1.1)
TR max vel: 456 cm/s

## 2015-12-09 LAB — MYOCARDIAL PERFUSION IMAGING
CHL CUP NUCLEAR SDS: 0
CHL CUP NUCLEAR SRS: 22
CHL CUP NUCLEAR SSS: 22
LHR: 0.38
LV dias vol: 273 mL (ref 62–150)
LVSYSVOL: 178 mL
Peak HR: 60 {beats}/min
Rest HR: 55 {beats}/min
TID: 1.04

## 2015-12-09 MED ORDER — TECHNETIUM TC 99M TETROFOSMIN IV KIT
11.0000 | PACK | Freq: Once | INTRAVENOUS | Status: AC | PRN
  Administered 2015-12-09: 11 via INTRAVENOUS
  Filled 2015-12-09: qty 11

## 2015-12-09 MED ORDER — TECHNETIUM TC 99M TETROFOSMIN IV KIT
32.1000 | PACK | Freq: Once | INTRAVENOUS | Status: AC | PRN
  Administered 2015-12-09: 32.1 via INTRAVENOUS
  Filled 2015-12-09: qty 32

## 2015-12-09 MED ORDER — REGADENOSON 0.4 MG/5ML IV SOLN
0.4000 mg | Freq: Once | INTRAVENOUS | Status: AC
  Administered 2015-12-09: 0.4 mg via INTRAVENOUS

## 2015-12-16 MED FILL — ADCIRCA 20 MG TABLET: 20 | 30 days supply | Qty: 30 | Fill #4

## 2015-12-16 MED FILL — BISOPROLOL FUMARATE 5 MG TA: 5 | 30 days supply | Qty: 15 | Fill #6

## 2015-12-23 ENCOUNTER — Other Ambulatory Visit (HOSPITAL_COMMUNITY): Payer: Self-pay | Admitting: Cardiology

## 2015-12-23 MED FILL — VALSARTAN 40 MG TABLET: 40 | 30 days supply | Qty: 90 | Fill #2

## 2015-12-24 MED FILL — FUROSEMIDE 40 MG TABLET: 40 | 30 days supply | Qty: 90 | Fill #0

## 2015-12-25 NOTE — Addendum Note (Signed)
Encounter addended by: Scarlette Calico, RN on: 12/25/2015 10:20 AM<BR>    Actions taken: Visit diagnoses modified, Order Entry activity accessed, Diagnosis association updated

## 2016-01-03 ENCOUNTER — Other Ambulatory Visit (HOSPITAL_COMMUNITY): Payer: Self-pay | Admitting: *Deleted

## 2016-01-03 ENCOUNTER — Encounter (HOSPITAL_COMMUNITY): Payer: Self-pay | Admitting: *Deleted

## 2016-01-03 ENCOUNTER — Encounter (HOSPITAL_COMMUNITY): Payer: Self-pay

## 2016-01-03 ENCOUNTER — Ambulatory Visit (HOSPITAL_COMMUNITY)
Admission: RE | Admit: 2016-01-03 | Discharge: 2016-01-03 | Disposition: A | Discharge status: Home / Self Care | Payer: Medicare Other | Source: Ambulatory Visit | Attending: Cardiology | Admitting: Cardiology

## 2016-01-03 VITALS — BP 138/52 | HR 58 | Wt 156.0 lb

## 2016-01-03 DIAGNOSIS — I252 Old myocardial infarction: Secondary | ICD-10-CM | POA: Insufficient documentation

## 2016-01-03 DIAGNOSIS — Z79899 Other long term (current) drug therapy: Secondary | ICD-10-CM | POA: Diagnosis not present

## 2016-01-03 DIAGNOSIS — I272 Pulmonary hypertension, unspecified: Secondary | ICD-10-CM | POA: Diagnosis not present

## 2016-01-03 DIAGNOSIS — I5022 Chronic systolic (congestive) heart failure: Secondary | ICD-10-CM | POA: Diagnosis not present

## 2016-01-03 DIAGNOSIS — Z8249 Family history of ischemic heart disease and other diseases of the circulatory system: Secondary | ICD-10-CM | POA: Diagnosis not present

## 2016-01-03 DIAGNOSIS — I472 Ventricular tachycardia, unspecified: Secondary | ICD-10-CM

## 2016-01-03 DIAGNOSIS — G629 Polyneuropathy, unspecified: Secondary | ICD-10-CM | POA: Diagnosis not present

## 2016-01-03 DIAGNOSIS — I6523 Occlusion and stenosis of bilateral carotid arteries: Secondary | ICD-10-CM | POA: Insufficient documentation

## 2016-01-03 DIAGNOSIS — Z951 Presence of aortocoronary bypass graft: Secondary | ICD-10-CM | POA: Diagnosis not present

## 2016-01-03 DIAGNOSIS — I4891 Unspecified atrial fibrillation: Secondary | ICD-10-CM | POA: Insufficient documentation

## 2016-01-03 DIAGNOSIS — I110 Hypertensive heart disease with heart failure: Secondary | ICD-10-CM | POA: Insufficient documentation

## 2016-01-03 DIAGNOSIS — Z8673 Personal history of transient ischemic attack (TIA), and cerebral infarction without residual deficits: Secondary | ICD-10-CM | POA: Diagnosis not present

## 2016-01-03 DIAGNOSIS — R55 Syncope and collapse: Secondary | ICD-10-CM

## 2016-01-03 DIAGNOSIS — Z7982 Long term (current) use of aspirin: Secondary | ICD-10-CM | POA: Insufficient documentation

## 2016-01-03 DIAGNOSIS — Z9889 Other specified postprocedural states: Secondary | ICD-10-CM

## 2016-01-03 DIAGNOSIS — I480 Paroxysmal atrial fibrillation: Secondary | ICD-10-CM

## 2016-01-03 DIAGNOSIS — I2510 Atherosclerotic heart disease of native coronary artery without angina pectoris: Secondary | ICD-10-CM | POA: Diagnosis not present

## 2016-01-03 DIAGNOSIS — G4733 Obstructive sleep apnea (adult) (pediatric): Secondary | ICD-10-CM | POA: Insufficient documentation

## 2016-01-03 DIAGNOSIS — E785 Hyperlipidemia, unspecified: Secondary | ICD-10-CM | POA: Diagnosis not present

## 2016-01-03 DIAGNOSIS — I872 Venous insufficiency (chronic) (peripheral): Secondary | ICD-10-CM | POA: Insufficient documentation

## 2016-01-03 DIAGNOSIS — K219 Gastro-esophageal reflux disease without esophagitis: Secondary | ICD-10-CM | POA: Diagnosis not present

## 2016-01-03 DIAGNOSIS — I351 Nonrheumatic aortic (valve) insufficiency: Secondary | ICD-10-CM | POA: Diagnosis not present

## 2016-01-03 LAB — CBC
HEMATOCRIT: 44.9 % (ref 39.0–52.0)
Hemoglobin: 15 g/dL (ref 13.0–17.0)
MCH: 30.2 pg (ref 26.0–34.0)
MCHC: 33.4 g/dL (ref 30.0–36.0)
MCV: 90.3 fL (ref 78.0–100.0)
PLATELETS: 168 10*3/uL (ref 150–400)
RBC: 4.97 MIL/uL (ref 4.22–5.81)
RDW: 15.2 % (ref 11.5–15.5)
WBC: 6.3 10*3/uL (ref 4.0–10.5)

## 2016-01-03 LAB — BASIC METABOLIC PANEL
Anion gap: 11 (ref 5–15)
BUN: 16 mg/dL (ref 6–20)
CALCIUM: 9.7 mg/dL (ref 8.9–10.3)
CO2: 26 mmol/L (ref 22–32)
CREATININE: 1.12 mg/dL (ref 0.61–1.24)
Chloride: 98 mmol/L — ABNORMAL LOW (ref 101–111)
GFR calc Af Amer: >60 mL/min (ref >60)
GLUCOSE: 107 mg/dL — AB (ref 65–99)
Potassium: 4.1 mmol/L (ref 3.5–5.1)
Sodium: 135 mmol/L (ref 135–145)

## 2016-01-03 LAB — DIGOXIN LEVEL: Digoxin Level: 0.5 ng/mL — ABNORMAL LOW (ref 0.8–2.0)

## 2016-01-03 LAB — PROTIME-INR
INR: 1.1
Prothrombin Time: 14.2 seconds (ref 11.4–15.2)

## 2016-01-03 NOTE — Patient Instructions (Addendum)
Labs today  Your physician has recommended that you wear an event monitor. Event monitors are medical devices that record the heart's electrical activity. Doctors most often [place] these monitors to diagnose arrhythmias. Arrhythmias are problems with the speed or rhythm of the heartbeat. The monitor is a small, portable device. You can wear one while you do your normal daily activities. This is usually used to diagnose what is causing palpitations/syncope (passing out).  Right Heart Catheterization, see instruction sheet  Keep follow up appointment as scheduled in December

## 2016-01-05 NOTE — Progress Notes (Signed)
Patient ID: Caleb Moment, PhD, male   DOB: [birth year], 75 y.o.   MRN: [id number]    Advanced Heart Failure Clinic Note   PCP: Dr. Yong [name] EP: Dr. Caryl [name] Cardiology: Dr. Aundra [name]  75 yo with complex past history presents for heart failure evaluation.  Patient had anterior MI in 2004 and developed ischemic cardiomyopathy as well as mitral regurgitation. He also developed atrial fibrillation.  In 10/14, he had MV repair, Maze, CABG with LIMA-LAD, and LA appendage closure at [facility] in [place].  Subsequently, atrial fibrillation returned and he had an atrial fibrillation ablation in [facility] in 3/15.  He wore a Zio patch in 77/15 and had a low atrial fibrillation burden of 7%.  He has had a long-standing ischemic cardiomyopathy.  In 2015, EF was 20-25%.  Echo in 2016 also showed EF 25-30% but estimated PA pressure suggested severe pulmonary hypertension, which is new for him.    At initial appointment, he reported increased exertional dyspnea over a number of weeks.  RHC was done, showing mildly elevated PCWP with moderate pulmonary HTN and low cardiac index (1.93 thermo, 2.13 Fick).  V/Q scan showed no PE.  I started him on digoxin and have titrated his Lasix to 80 mg daily.  He is now on bisoprolol and able to tolerate it.  At last appointment, I had him try replacing valsartan with Entresto 24/26 bid.  He was unable to tolerate it (made him dizzy, though BP was not low).  Therefore, I had him restart valsartan.  CPX in 4/16 showed mildly decreased functional capacity.  He has tolerated Adcirca 20 mg daily.  He did not tolerate an attempt to uptitrate bisoprolol.  He tried CPAP but was unable to tolerate it.  He was unable to tolerate eplerenone 50 mg daily.  Last echo in 9/17 showed EF 20-25%, stable MV repair, normal RV, and PA systolic pressure 86 mmHg.   He had a Lexiscan Cardiolite in 9/17 that showed infarction, no ischemia.   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   He was sitting at the dinner table and felt profoundly dizzy for < 30 seconds.  He did not feel palpitations.  He did not pass out.  Just felt lightheaded.  There has been no recurrence.  He has also been feeling more fatigued in general.  Still able to walk on flat ground without shortness of breath.  No chest pain.  No orthopnea/PND. Weight is down 1 lb.    6 minute walk (3/16): 381 m  6 minute walk (5/16): 414.5 m 6 minute walk (10/16): 562 m 6 minute walk (1/17): 469 m 6 minute walk (8/17): 488 m  Labs (2/15): LDL 144 Labs (8/15): K 4.6, creatinine 0.9 Labs (12/15): HCT 42.3   Labs (2/16): K 4 => 4.2, creatinine 1.05 => 0.92, BNP 268 Labs (3/16): BNP 495 => 320, digoxin 0.4, RF 14.7 (very mild increase), TSH normal, HIV negative, anti-SCL70 negative, creatinine 0.91, K 4.4 Labs (7/16): K 5, creatinine 1.05, BNP 455, vitamin D normal, digoxin 0.4, B12 normal Labs (8/16): digoxin 0.8, BNP 305, K 4.2, creatinine 0.99 Labs (9/16): HCT 43.3, TSH normal, BNP 492 => 278, K 4.3, creatinine 0.97 => 1.04, digoxin 0.6, TSH normal, LDL 154, LDL-P 1654.  Labs (10/16): K 4.7, creatinine 1.1 Labs (1/17): pro-BNP 2065, digoxin 0.3, K 4.3, creatinine 1.10 => 1.28 Labs (3/17): K 4.1, creatinine 1.09, HCT 38.3 Labs (4/17): K 4.4, creatinine 0.99, digoxin 0.8 Labs (5/17):  K 4.3, creatinine 1.08, BNP 317, hgb 13.1 Labs (8/17): K 4.6, creatinine 0.99, BNP 392, digoxin 0.5 Labs (9/17): K 4.4, creatinine 1.05  PMH: 1. CAD: Anterior MI in 2004.  Cardiac surgery in 10/14 included LIMA-LAD.  - Lexiscan Cardiolite (9/17): EF 35%, infarct present with no ischemia.  2. Chronic mitral regurgitation: 10/14 surgery at [facility] with MV repair, Maze, LIMA-LAD, and LA appendage closure.  3. Atrial fibrillation: Paroxysmal.  He was initially on Tikosyn but had breakthrough atrial fibrillation.  H/o Maze in 2014.  Had recurrent atrial fibrillation with ablation in 3/15 by [name] in [facility].  Not  anticoagulated after 2 severe prostate bleeding episodes.  LA appendage was clipped with MV surgery.  - Zio patch in 12/15 with low atrial fibrillation burden (7%).  - Holter (9/16) with PACs, PVCs, short atrial fibrillation runs (nothing sustained). 4. Chronotropic incompetence.  5. HTN 6. Hyperlipidemia: Refuses statin.  7. Peripheral neuropathy 8. GERD 9. H/o BPPV 10. H/o TIA 11. Ischemic cardiomyopathy: cardiac MRI 7/14 with EF 35%, moderate MR, normal RV size and systolic function, extensive anterior and anteroseptal LGE suggestive of non-viable myocardium (this was prior to LIMA-LAD).  Echo 1/15 with EF 20-25%, moderate AI, PA systolic pressure 39 mmHg. Echo (1/16) with EF 20-25%, diffuse hypokinesis with regionality, moderate LV dilation, moderate AI, s/p MV repair with mild MR and normal gradients, RV dilated with mildly decreased systolic function, PA systolic pressure 71 mmHg.   - RHC (2/16) with mean RA 6, PA 61/25 mean 40, mean PCWP 23, CI 2.13/PVR 4.3 (Fick), CI 1.93/PVR 4.7 (thermo).   - CPX (4/16) with peak VO2 17.9, VE/VCO2 34.7 => mildly decreased functional capacity.  - Spironolactone apparently caused cognitive deficits - Intolerant of Coreg due to development of severe alopecia - Unable to uptitrate bisoprolol due to intolerance.  - Lightheaded with Entresto.  - Unable to tolerate increase in valsartan to 80 mg bid.  - Echo (1/17) with EF 30-35%, moderate LV dilation, mild AI, s/p MV repair with mild MR, moderately dilated RV with mildly decreased systolic function, PA systolic pressure 69 mmHg.  - CPX (4/17): peak VO2 18, VE/VCO2 slope 33, RER 1.28 => mild to moderate functional impairment, mildly improved.  - Echo (9/17): EF 20-25% with regional WMAs, normal RV size and systolic function, PASP 86 mmHg, stable repaired mitral valve with mild MR, moderate AI.  12. Carotid stenosis: Carotid dopplers (1/16) with 40-59% bilateral ICA stenosis. Carotid dopplers (4/17) with 40-59%  BICA stenosis.  13. Aortic insufficiency: Moderate by last echo 9/17.  14. Pulmonary HTN: Mixed PAH and pulmonary venous hypertension.  PFTs (9/14) were normal.  V/Q scan (2/16) with no evidence of acute or chronic PE.  6 minute walk (3/16) 381 m. 6 minute walk (5/16) 414.5 m. 6 minute walk (1/17) 469 m.  15. OSA: Moderate on 5/16 sleep study. Unable to tolerate CPAP.  16. Venous insufficiency 17. Ventral hernia  SH: Married, lives in [place], Engineer, water, nonsmoker  FH: CAD  ROS: All systems reviewed and negative except as per HPI.   Current Outpatient Prescriptions  Medication Sig Dispense Refill  . ADCIRCA 20 MG TABS TAKE 1 TABLET BY MOUTH ONCE DAILY 30 tablet 6  . aspirin 81 MG chewable tablet Chew 81 mg by mouth daily.    . bisoprolol (ZEBETA) 5 MG tablet Take 0.5 tablets (2.5 mg total) by mouth daily. 15 tablet 6  . digoxin (LANOXIN) 0.125 MG tablet TAKE 1 TABLET BY MOUTH ONCE DAILY 90  tablet 3  . eplerenone (INSPRA) 50 MG tablet Take 25 mg by mouth daily.    . furosemide (LASIX) 40 MG tablet TAKE 2 TABLETS BY MOUTH EVERY MORNING AND 1 TABLET EVERY EVENING 90 tablet 6  . Magnesium 100 MG CAPS Take 1 capsule by mouth 3 (three) times daily.     . Multiple Vitamins-Minerals (MULTIVITAMIN WITH MINERALS) tablet Take 1 tablet by mouth daily.    . nitroGLYCERIN (NITROSTAT) 0.4 MG SL tablet Place 1 tablet (0.4 mg total) under the tongue every 5 (five) minutes as needed for chest pain. 25 tablet 3  . Omega-3 Fatty Acids (THE VERY FINEST FISH OIL) LIQD Take 2-3 g by mouth daily.     . valsartan (DIOVAN) 40 MG tablet TAKE 1 TABLET BY MOUTH EVERY MORNING AND 2 TABLETS EVERY EVENING 90 tablet 3  . Zinc 100 MG TABS Take 1 tablet by mouth daily.      No current facility-administered medications for this encounter.    BP (!) 138/52 (BP Location: Left Arm, Patient Position: Sitting, Cuff Size: Normal)   Pulse (!) 58   Wt 156 lb (70.8 kg)   SpO2 99%   BMI 22.38 kg/m    Wt Readings from  Last 3 Encounters:  01/03/16 156 lb (70.8 kg)  11/27/15 157 lb (71.2 kg)  11/06/15 156 lb 12 oz (71.1 kg)     General: NAD Neck: No JVD, no thyromegaly or thyroid nodule.  Lungs: Clear to auscultation bilaterally with normal respiratory effort. CV: Lateral PMI.  Heart regular S1/S2, no S3/S4, 2/6 HSM LLSB.  1+ left ankle edema, no right ankle edema.  No carotid bruit.  Normal pedal pulses.  Abdomen: Soft, NT, ND, no HSM. No bruits or masses. +BS.  Small ventral hernia, reducible.  Neurologic: Alert and oriented x 3.  Psych: Normal affect. Extremities: No clubbing or cyanosis.  HEENT: Normal.   Assessment/Plan: 1. CAD: S/p LIMA-LAD.  He is on ASA 81.  He has decided not to take statins after reviewing the data (I did recommend taking a statin but we have agreed to disagree on this).  Lexiscan Cardiolite was done in 9/17 due to atypical chest pain.  This showed prior infarction with no ischemia.  No recurrence of chest pain since that time despite ongoing exercise.   2. S/p mitral valve repair: The MV repair looked stable on 9/17 echo with no evidence for significant mitral stenosis.  3. Aortic insufficiency: Moderate on 9/17 echo.  Continue to follow.  4. Chronic systolic CHF: Ischemic cardiomyopathy, EF 20-25% on 9/17 echo.  PA pressure remains elevated by doppler measurement but RV systolic function was normal.  Mildly decreased functional capacity on 4/16 CPX, this was mildly improved on 4/17 CPX.  NYHA class II. He does not appear volume overloaded today.  He is concerned about the elevated PA pressure and generally feels more fatigued.  - Continue current Lasix dosing.  BMET today.  - Continue digoxin, check level today.  - Continue bisoprolol 2.5 mg daily (unable to tolerate increase).   - Continue valsartan 40 qam/80 qpm.  He did not tolerate Entresto or further uptitration of valsartan.    - Continue eplerenone 25 (unable to tolerate increase).        - He has refused ICD in the  past.  Given his presyncopal episode this week, I brought this up again.  He is going to think about it and get back to me.   - In the future, he  could be an LVAD candidate if he worsens.   5. Atrial fibrillation: s/p Maze in 10/14, then ablation in 3/15.  Prior to Maze, he was on Tikosyn but had breakthrough.  Currently, he is not anticoagulated due to history of prostate bleeding.  He did have his LA appendage clipped at time of MV surgery.  NSR today, occasional palpitations that may represent transient atrial fibrillation.  - He remains reluctant to be anticoagulated. - Continue bisoprolol.  6. Pulmonary hypertension: Severe by most recent echo in 9/17, moderate by last RHC.  RV actually looked ok on 9/17 echo.  Mixed pulmonary venous and pulmonary arterial HTN on prior RHC.  It is possible that the [facility] component is due to pulmonary vascular remodeling in the setting of chronic mitral regurgitation prior to MV repair. Negative V/Q scan, no evidence for CTEPH.  PFTs normal in 9/14.  He is seeing Dr [place] Bells.  Sleep study showed moderate OSA but he has been unable to tolerated CPAP (using oral device).  He has started on Adcirca and felt like it helped.   - We discussed increasing Adcirca and potentially adding an ERA.  I would like to repeat RHC for reassessment of PA pressure and PVR prior to doing this.  We will plan on RHC next week.  I explained risks/benefits of procedure and he is willing to have it done.  7. OSA: Moderate.  Has not tolerated CPAP. Probably plays a role in recurrent atrial fibrillation.  He now uses and mouthpiece at night and feels like this helps.  8. Carotid stenosis: Stable carotid dopplers, repeat in 4/18.   9. Presyncope: Earlier this week, profound lightheadedness without palpitations.  Given his history, concerned for arrhythmia but cannot rule out vagal episode.  We discussed ICD again, he is going to think about it.  In the meantime, I am going to have him wear a 30 day  monitor.   Loralie Champagne 01/05/2016

## 2016-01-08 ENCOUNTER — Ambulatory Visit (INDEPENDENT_AMBULATORY_CARE_PROVIDER_SITE_OTHER): Payer: Medicare Other

## 2016-01-08 DIAGNOSIS — Z23 Encounter for immunization: Secondary | ICD-10-CM

## 2016-01-10 ENCOUNTER — Other Ambulatory Visit (HOSPITAL_COMMUNITY): Payer: Self-pay | Admitting: Pharmacist

## 2016-01-10 ENCOUNTER — Ambulatory Visit (HOSPITAL_COMMUNITY)
Admission: RE | Admit: 2016-01-10 | Discharge: 2016-01-10 | Disposition: A | Discharge status: Home / Self Care | Payer: Medicare Other | Source: Ambulatory Visit | Attending: Cardiology | Admitting: Cardiology

## 2016-01-10 ENCOUNTER — Encounter (HOSPITAL_COMMUNITY)
Admission: RE | Disposition: A | Discharge status: Home / Self Care | Payer: Self-pay | Source: Ambulatory Visit | Attending: Cardiology

## 2016-01-10 DIAGNOSIS — Z8249 Family history of ischemic heart disease and other diseases of the circulatory system: Secondary | ICD-10-CM | POA: Insufficient documentation

## 2016-01-10 DIAGNOSIS — I272 Pulmonary hypertension, unspecified: Secondary | ICD-10-CM

## 2016-01-10 DIAGNOSIS — I2721 Secondary pulmonary arterial hypertension: Secondary | ICD-10-CM | POA: Insufficient documentation

## 2016-01-10 DIAGNOSIS — K219 Gastro-esophageal reflux disease without esophagitis: Secondary | ICD-10-CM | POA: Insufficient documentation

## 2016-01-10 DIAGNOSIS — Z7982 Long term (current) use of aspirin: Secondary | ICD-10-CM | POA: Diagnosis not present

## 2016-01-10 DIAGNOSIS — I110 Hypertensive heart disease with heart failure: Secondary | ICD-10-CM | POA: Insufficient documentation

## 2016-01-10 DIAGNOSIS — Z8673 Personal history of transient ischemic attack (TIA), and cerebral infarction without residual deficits: Secondary | ICD-10-CM | POA: Insufficient documentation

## 2016-01-10 DIAGNOSIS — I255 Ischemic cardiomyopathy: Secondary | ICD-10-CM | POA: Diagnosis not present

## 2016-01-10 DIAGNOSIS — I2510 Atherosclerotic heart disease of native coronary artery without angina pectoris: Secondary | ICD-10-CM | POA: Diagnosis not present

## 2016-01-10 DIAGNOSIS — I252 Old myocardial infarction: Secondary | ICD-10-CM | POA: Insufficient documentation

## 2016-01-10 DIAGNOSIS — G4733 Obstructive sleep apnea (adult) (pediatric): Secondary | ICD-10-CM | POA: Diagnosis not present

## 2016-01-10 DIAGNOSIS — E785 Hyperlipidemia, unspecified: Secondary | ICD-10-CM | POA: Diagnosis not present

## 2016-01-10 DIAGNOSIS — I480 Paroxysmal atrial fibrillation: Secondary | ICD-10-CM | POA: Diagnosis not present

## 2016-01-10 DIAGNOSIS — I872 Venous insufficiency (chronic) (peripheral): Secondary | ICD-10-CM | POA: Diagnosis not present

## 2016-01-10 DIAGNOSIS — I5022 Chronic systolic (congestive) heart failure: Secondary | ICD-10-CM | POA: Diagnosis not present

## 2016-01-10 DIAGNOSIS — G629 Polyneuropathy, unspecified: Secondary | ICD-10-CM | POA: Diagnosis not present

## 2016-01-10 DIAGNOSIS — I6523 Occlusion and stenosis of bilateral carotid arteries: Secondary | ICD-10-CM | POA: Diagnosis not present

## 2016-01-10 DIAGNOSIS — Z952 Presence of prosthetic heart valve: Secondary | ICD-10-CM | POA: Insufficient documentation

## 2016-01-10 HISTORY — PX: CARDIAC CATHETERIZATION: SHX172

## 2016-01-10 LAB — POCT I-STAT 3, VENOUS BLOOD GAS (G3P V)
ACID-BASE EXCESS: 1 mmol/L (ref 0.0–2.0)
ACID-BASE EXCESS: 1 mmol/L (ref 0.0–2.0)
BICARBONATE: 26.1 mmol/L (ref 20.0–28.0)
Bicarbonate: 26.7 mmol/L (ref 20.0–28.0)
O2 SAT: 67 %
O2 SAT: 68 %
PCO2 VEN: 44.6 mmHg (ref 44.0–60.0)
PH VEN: 7.376 (ref 7.250–7.430)
PO2 VEN: 36 mmHg (ref 32.0–45.0)
PO2 VEN: 36 mmHg (ref 32.0–45.0)
TCO2: 27 mmol/L (ref 0–100)
TCO2: 28 mmol/L (ref 0–100)
pCO2, Ven: 44.6 mmHg (ref 44.0–60.0)
pH, Ven: 7.384 (ref 7.250–7.430)

## 2016-01-10 SURGERY — RIGHT HEART CATH

## 2016-01-10 MED ORDER — MIDAZOLAM HCL 2 MG/2ML IJ SOLN
INTRAMUSCULAR | Status: DC | PRN
  Administered 2016-01-10: 0.5 mg via INTRAVENOUS

## 2016-01-10 MED ORDER — FENTANYL CITRATE (PF) 100 MCG/2ML IJ SOLN
INTRAMUSCULAR | Status: AC
  Filled 2016-01-10: qty 2

## 2016-01-10 MED ORDER — AMIODARONE HCL 150 MG/3ML IV SOLN
INTRAVENOUS | Status: AC
  Filled 2016-01-10: qty 3

## 2016-01-10 MED ORDER — SODIUM CHLORIDE 0.9 % IV SOLN: 250.0000 mL | INTRAVENOUS | Status: DC | PRN

## 2016-01-10 MED ORDER — TADALAFIL (PAH) 20 MG PO TABS: 40.0000 mg | ORAL_TABLET | Freq: Every day | ORAL | 11 refills | Status: DC

## 2016-01-10 MED ORDER — MIDAZOLAM HCL 2 MG/2ML IJ SOLN
INTRAMUSCULAR | Status: AC
  Filled 2016-01-10: qty 2

## 2016-01-10 MED ORDER — LIDOCAINE HCL (PF) 1 % IJ SOLN
INTRAMUSCULAR | Status: DC | PRN
  Administered 2016-01-10: 2 mL
  Administered 2016-01-10: 20 mL

## 2016-01-10 MED ORDER — LIDOCAINE HCL (PF) 1 % IJ SOLN
INTRAMUSCULAR | Status: AC
  Filled 2016-01-10: qty 30

## 2016-01-10 MED ORDER — SODIUM CHLORIDE 0.9% FLUSH: 3.0000 mL | Freq: Two times a day (BID) | INTRAVENOUS | Status: DC

## 2016-01-10 MED ORDER — SODIUM CHLORIDE 0.9 % IV SOLN
INTRAVENOUS | Status: DC
  Administered 2016-01-10: 11:00:00 via INTRAVENOUS

## 2016-01-10 MED ORDER — SODIUM CHLORIDE 0.9% FLUSH: 3.0000 mL | INTRAVENOUS | Status: DC | PRN

## 2016-01-10 MED ORDER — ONDANSETRON HCL 4 MG/2ML IJ SOLN
4.0000 mg | Freq: Four times a day (QID) | INTRAMUSCULAR | Status: DC | PRN
Start: 2016-01-10 — End: 2016-01-10

## 2016-01-10 MED ORDER — ASPIRIN 81 MG PO CHEW: 81.0000 mg | CHEWABLE_TABLET | ORAL | Status: DC

## 2016-01-10 MED ORDER — AMIODARONE LOAD VIA INFUSION
INTRAVENOUS | Status: DC | PRN
  Administered 2016-01-10: 150 mg via INTRAVENOUS

## 2016-01-10 MED ORDER — HEPARIN (PORCINE) IN NACL 2-0.9 UNIT/ML-% IJ SOLN
INTRAMUSCULAR | Status: AC
  Filled 2016-01-10: qty 500

## 2016-01-10 MED ORDER — HEPARIN (PORCINE) IN NACL 2-0.9 UNIT/ML-% IJ SOLN
INTRAMUSCULAR | Status: DC | PRN
  Administered 2016-01-10: 500 mL

## 2016-01-10 MED ORDER — FENTANYL CITRATE (PF) 100 MCG/2ML IJ SOLN
INTRAMUSCULAR | Status: DC | PRN
  Administered 2016-01-10: 12.5 ug via INTRAVENOUS

## 2016-01-10 MED ORDER — ACETAMINOPHEN 325 MG PO TABS: 650.0000 mg | ORAL_TABLET | ORAL | Status: DC | PRN

## 2016-01-10 SURGICAL SUPPLY — 10 items
CATH BALLN WEDGE 5F 110CM (CATHETERS) ×1 IMPLANT
CATH SWAN GANZ 7F STRAIGHT (CATHETERS) ×1 IMPLANT
PACK CARDIAC CATHETERIZATION (CUSTOM PROCEDURE TRAY) ×2 IMPLANT
SHEATH FAST CATH BRACH 5F 5CM (SHEATH) ×1 IMPLANT
SHEATH PINNACLE 7F 10CM (SHEATH) ×1 IMPLANT
TRANSDUCER W/STOPCOCK (MISCELLANEOUS) ×3 IMPLANT
TUBING ART PRESS 72  MALE/FEM (TUBING) ×1
TUBING ART PRESS 72 MALE/FEM (TUBING) IMPLANT
WIRE EMERALD 3MM-J .025X260CM (WIRE) ×1 IMPLANT
WIRE MICROINTRODUCER 60CM (WIRE) ×1 IMPLANT

## 2016-01-10 NOTE — Discharge Instructions (Signed)
Groin Surgical Site Care Refer to this sheet in the next few weeks. These instructions provide you with information about caring for yourself after your procedure. Your health care provider may also give you more specific instructions. Your treatment has been planned according to current medical practices, but problems sometimes occur. Call your health care provider if you have any problems or questions after your procedure. WHAT TO EXPECT AFTER THE PROCEDURE After your procedure, it is typical to have the following:  Bruising at the groin site that usually fades within 1-2 weeks.  Blood collecting in the tissue (hematoma) that may be painful to the touch. It should usually decrease in size and tenderness within 1-2 weeks. HOME CARE INSTRUCTIONS  Take medicines only as directed by your health care provider.  You may shower 24-48 hours after the procedure or as directed by your health care provider. Remove the bandage (dressing) and gently wash the site with plain soap and water. Pat the area dry with a clean towel. Do not rub the site, because this may cause bleeding.  Do not take baths, swim, or use a hot tub until your health care provider approves.  Check your insertion site every day for redness, swelling, or drainage.  Do not apply powder or lotion to the site.  Limit use of stairs to twice a day for the first 2-3 days or as directed by your health care provider.  Do not squat for the first 2-3 days or as directed by your health care provider.  Do not lift over 10 lb (4.5 kg) for 5 days after your procedure or as directed by your health care provider.  Ask your health care provider when it is okay to:  Return to work or school.  Resume usual physical activities or sports.  Resume sexual activity.  Do not drive home if you are discharged the same day as the procedure. Have someone else drive you.  You may drive 24 hours after the procedure unless otherwise instructed by your  health care provider.  Do not operate machinery or power tools for 24 hours after the procedure or as directed by your health care provider.  If your procedure was done as an outpatient procedure, which means that you went home the same day as your procedure, a responsible adult should be with you for the first 24 hours after you arrive home.  Keep all follow-up visits as directed by your health care provider. This is important. SEEK MEDICAL CARE IF:  You have a fever.  You have chills.  You have increased bleeding from the groin site. Hold pressure on the site. SEEK IMMEDIATE MEDICAL CARE IF:  You have unusual pain at the groin site.  You have redness, warmth, or swelling at the groin site.  You have drainage (other than a small amount of blood on the dressing) from the groin site.  The groin site is bleeding, and the bleeding does not stop after 30 minutes of holding steady pressure on the site.  Your leg or foot becomes pale, cool, tingly, or numb.   This information is not intended to replace advice given to you by your health care provider. Make sure you discuss any questions you have with your health care provider.   Document Released: 11/10/2013 Document Reviewed: 11/10/2013 Elsevier Interactive Patient Education [employer].

## 2016-01-10 NOTE — Interval H&P Note (Signed)
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 After consideration of risks, benefits and other options for treatment, the patient has consented to  Procedure(s): Right Heart Cath (N/A) as a surgical intervention .  The patient's history has been reviewed, patient examined, no change in status, stable for surgery.  I have reviewed the patient's chart and labs.  Questions were answered to the patient's satisfaction.     [place] [employer]

## 2016-01-10 NOTE — H&P (View-Only) (Signed)
Patient ID: Caleb Moment, PhD, male   DOB: [birth year], 75 y.o.   MRN: [id number]    Advanced Heart Failure Clinic Note   PCP: [name] EP: [name] Cardiology: [name]  75 yo with complex past history presents for heart failure evaluation.  Patient had anterior MI in 2004 and developed ischemic cardiomyopathy as well as mitral regurgitation. He also developed atrial fibrillation.  In 10/14, he had MV repair, Maze, CABG with LIMA-LAD, and LA appendage closure at [facility] in [place].  Subsequently, atrial fibrillation returned and he had an atrial fibrillation ablation in [facility] in 3/15.  He wore a Zio patch in 12/15 and had a low atrial fibrillation burden of 7%.  He has had a long-standing ischemic cardiomyopathy.  In 2015, EF was 20-25%.  Echo in 2016 also showed EF 25-30% but estimated PA pressure suggested severe pulmonary hypertension, which is new for him.    At initial appointment, he reported increased exertional dyspnea over a number of weeks.  RHC was done, showing mildly elevated PCWP with moderate pulmonary HTN and low cardiac index (1.93 thermo, 2.13 Fick).  V/Q scan showed no PE.  I started him on digoxin and have titrated his Lasix to 80 mg daily.  He is now on bisoprolol and able to tolerate it.  At last appointment, I had him try replacing valsartan with Entresto 24/26 bid.  He was unable to tolerate it (made him dizzy, though BP was not low).  Therefore, I had him restart valsartan.  CPX in 4/16 showed mildly decreased functional capacity.  He has tolerated Adcirca 20 mg daily.  He did not tolerate an attempt to uptitrate bisoprolol.  He tried CPAP but was unable to tolerate it.  He was unable to tolerate eplerenone 50 mg daily.  Last echo in 9/17 showed EF 20-25%, stable MV repair, normal RV, and PA systolic pressure 86 mmHg.   He had a Lexiscan Cardiolite in 9/17 that showed infarction, no ischemia.   [name] presents today because of a "dizzy spell" last Sunday.   He was sitting at the dinner table and felt profoundly dizzy for < 30 seconds.  He did not feel palpitations.  He did not pass out.  Just felt lightheaded.  There has been no recurrence.  He has also been feeling more fatigued in general.  Still able to walk on flat ground without shortness of breath.  No chest pain.  No orthopnea/PND. Weight is down 1 lb.    6 minute walk (3/16): 381 m  6 minute walk (5/16): 414.5 m 6 minute walk (10/16): 562 m 6 minute walk (1/17): 469 m 6 minute walk (8/17): 488 m  Labs (2/15): LDL 144 Labs (8/15): K 4.6, creatinine 0.9 Labs (12/15): HCT 42.3   Labs (2/16): K 4 => 4.2, creatinine 1.05 => 0.92, BNP 268 Labs (3/16): BNP 495 => 320, digoxin 0.4, RF 14.7 (very mild increase), TSH normal, HIV negative, anti-SCL70 negative, creatinine 0.91, K 4.4 Labs (7/16): K 5, creatinine 1.05, BNP 455, vitamin D normal, digoxin 0.4, B12 normal Labs (8/16): digoxin 0.8, BNP 305, K 4.2, creatinine 0.99 Labs (9/16): HCT 43.3, TSH normal, BNP 492 => 278, K 4.3, creatinine 0.97 => 1.04, digoxin 0.6, TSH normal, LDL 154, LDL-P 1654.  Labs (10/16): K 4.7, creatinine 1.1 Labs (1/17): pro-BNP 2065, digoxin 0.3, K 4.3, creatinine 1.10 => 1.28 Labs (3/17): K 4.1, creatinine 1.09, HCT 38.3 Labs (4/17): K 4.4, creatinine 0.99, digoxin 0.8 Labs (5/17):  K 4.3, creatinine 1.08, BNP 317, hgb 13.1 Labs (8/17): K 4.6, creatinine 0.99, BNP 392, digoxin 0.5 Labs (9/17): K 4.4, creatinine 1.05  PMH: 1. CAD: Anterior MI in 2004.  Cardiac surgery in 10/14 included LIMA-LAD.  - Lexiscan Cardiolite (9/17): EF 35%, infarct present with no ischemia.  2. Chronic mitral regurgitation: 10/14 surgery at [facility] with MV repair, Maze, LIMA-LAD, and LA appendage closure.  3. Atrial fibrillation: Paroxysmal.  He was initially on Tikosyn but had breakthrough atrial fibrillation.  H/o Maze in 2014.  Had recurrent atrial fibrillation with ablation in 3/15 by Dr Ola Spurr in [facility].  Not  anticoagulated after 2 severe prostate bleeding episodes.  LA appendage was clipped with MV surgery.  - Zio patch in 12/15 with low atrial fibrillation burden (7%).  - Holter (9/16) with PACs, PVCs, short atrial fibrillation runs (nothing sustained). 4. Chronotropic incompetence.  5. HTN 6. Hyperlipidemia: Refuses statin.  7. Peripheral neuropathy 8. GERD 9. H/o BPPV 10. H/o TIA 11. Ischemic cardiomyopathy: cardiac MRI 7/14 with EF 35%, moderate MR, normal RV size and systolic function, extensive anterior and anteroseptal LGE suggestive of non-viable myocardium (this was prior to LIMA-LAD).  Echo 1/15 with EF 20-25%, moderate AI, PA systolic pressure 39 mmHg. Echo (1/16) with EF 20-25%, diffuse hypokinesis with regionality, moderate LV dilation, moderate AI, s/p MV repair with mild MR and normal gradients, RV dilated with mildly decreased systolic function, PA systolic pressure 71 mmHg.   - RHC (2/16) with mean RA 6, PA 61/25 mean 40, mean PCWP 23, CI 2.13/PVR 4.3 (Fick), CI 1.93/PVR 4.7 (thermo).   - CPX (4/16) with peak VO2 17.9, VE/VCO2 34.7 => mildly decreased functional capacity.  - Spironolactone apparently caused cognitive deficits - Intolerant of Coreg due to development of severe alopecia - Unable to uptitrate bisoprolol due to intolerance.  - Lightheaded with Entresto.  - Unable to tolerate increase in valsartan to 80 mg bid.  - Echo (1/17) with EF 30-35%, moderate LV dilation, mild AI, s/p MV repair with mild MR, moderately dilated RV with mildly decreased systolic function, PA systolic pressure 69 mmHg.  - CPX (4/17): peak VO2 18, VE/VCO2 slope 33, RER 1.28 => mild to moderate functional impairment, mildly improved.  - Echo (9/17): EF 20-25% with regional WMAs, normal RV size and systolic function, PASP 86 mmHg, stable repaired mitral valve with mild MR, moderate AI.  12. Carotid stenosis: Carotid dopplers (1/16) with 40-59% bilateral ICA stenosis. Carotid dopplers (4/17) with 40-59%  BICA stenosis.  13. Aortic insufficiency: Moderate by last echo 9/17.  14. Pulmonary HTN: Mixed PAH and pulmonary venous hypertension.  PFTs (9/14) were normal.  V/Q scan (2/16) with no evidence of acute or chronic PE.  6 minute walk (3/16) 381 m. 6 minute walk (5/16) 414.5 m. 6 minute walk (1/17) 469 m.  15. OSA: Moderate on 5/16 sleep study. Unable to tolerate CPAP.  16. Venous insufficiency 17. Ventral hernia  SH: Married, lives in [place], Engineer, water, nonsmoker  FH: CAD  ROS: All systems reviewed and negative except as per HPI.   Current Outpatient Prescriptions  Medication Sig Dispense Refill  . ADCIRCA 20 MG TABS TAKE 1 TABLET BY MOUTH ONCE DAILY 30 tablet 6  . aspirin 81 MG chewable tablet Chew 81 mg by mouth daily.    . bisoprolol (ZEBETA) 5 MG tablet Take 0.5 tablets (2.5 mg total) by mouth daily. 15 tablet 6  . digoxin (LANOXIN) 0.125 MG tablet TAKE 1 TABLET BY MOUTH ONCE DAILY 90  tablet 3  . eplerenone (INSPRA) 50 MG tablet Take 25 mg by mouth daily.    . furosemide (LASIX) 40 MG tablet TAKE 2 TABLETS BY MOUTH EVERY MORNING AND 1 TABLET EVERY EVENING 90 tablet 6  . Magnesium 100 MG CAPS Take 1 capsule by mouth 3 (three) times daily.     . Multiple Vitamins-Minerals (MULTIVITAMIN WITH MINERALS) tablet Take 1 tablet by mouth daily.    . nitroGLYCERIN (NITROSTAT) 0.4 MG SL tablet Place 1 tablet (0.4 mg total) under the tongue every 5 (five) minutes as needed for chest pain. 25 tablet 3  . Omega-3 Fatty Acids (THE VERY FINEST FISH OIL) LIQD Take 2-3 g by mouth daily.     . valsartan (DIOVAN) 40 MG tablet TAKE 1 TABLET BY MOUTH EVERY MORNING AND 2 TABLETS EVERY EVENING 90 tablet 3  . Zinc 100 MG TABS Take 1 tablet by mouth daily.      No current facility-administered medications for this encounter.    BP (!) 138/52 (BP Location: Left Arm, Patient Position: Sitting, Cuff Size: Normal)   Pulse (!) 58   Wt 156 lb (70.8 kg)   SpO2 99%   BMI 22.38 kg/m    Wt Readings from  Last 3 Encounters:  01/03/16 156 lb (70.8 kg)  11/27/15 157 lb (71.2 kg)  11/06/15 156 lb 12 oz (71.1 kg)     General: NAD Neck: No JVD, no thyromegaly or thyroid nodule.  Lungs: Clear to auscultation bilaterally with normal respiratory effort. CV: Lateral PMI.  Heart regular S1/S2, no S3/S4, 2/6 HSM LLSB.  1+ left ankle edema, no right ankle edema.  No carotid bruit.  Normal pedal pulses.  Abdomen: Soft, NT, ND, no HSM. No bruits or masses. +BS.  Small ventral hernia, reducible.  Neurologic: Alert and oriented x 3.  Psych: Normal affect. Extremities: No clubbing or cyanosis.  HEENT: Normal.   Assessment/Plan: 1. CAD: S/p LIMA-LAD.  He is on ASA 81.  He has decided not to take statins after reviewing the data (I did recommend taking a statin but we have agreed to disagree on this).  Lexiscan Cardiolite was done in 9/17 due to atypical chest pain.  This showed prior infarction with no ischemia.  No recurrence of chest pain since that time despite ongoing exercise.   2. S/p mitral valve repair: The MV repair looked stable on 9/17 echo with no evidence for significant mitral stenosis.  3. Aortic insufficiency: Moderate on 9/17 echo.  Continue to follow.  4. Chronic systolic CHF: Ischemic cardiomyopathy, EF 20-25% on 9/17 echo.  PA pressure remains elevated by doppler measurement but RV systolic function was normal.  Mildly decreased functional capacity on 4/16 CPX, this was mildly improved on 4/17 CPX.  NYHA class II. He does not appear volume overloaded today.  He is concerned about the elevated PA pressure and generally feels more fatigued.  - Continue current Lasix dosing.  BMET today.  - Continue digoxin, check level today.  - Continue bisoprolol 2.5 mg daily (unable to tolerate increase).   - Continue valsartan 40 qam/80 qpm.  He did not tolerate Entresto or further uptitration of valsartan.    - Continue eplerenone 25 (unable to tolerate increase).        - He has refused ICD in the  past.  Given his presyncopal episode this week, I brought this up again.  He is going to think about it and get back to me.   - In the future, he  could be an LVAD candidate if he worsens.   5. Atrial fibrillation: s/p Maze in 10/14, then ablation in 3/15.  Prior to Maze, he was on Tikosyn but had breakthrough.  Currently, he is not anticoagulated due to history of prostate bleeding.  He did have his LA appendage clipped at time of MV surgery.  NSR today, occasional palpitations that may represent transient atrial fibrillation.  - He remains reluctant to be anticoagulated. - Continue bisoprolol.  6. Pulmonary hypertension: Severe by most recent echo in 9/17, moderate by last RHC.  RV actually looked ok on 9/17 echo.  Mixed pulmonary venous and pulmonary arterial HTN on prior RHC.  It is possible that the [facility] component is due to pulmonary vascular remodeling in the setting of chronic mitral regurgitation prior to MV repair. Negative V/Q scan, no evidence for CTEPH.  PFTs normal in 9/14.  He is seeing Dr Lake Bells.  Sleep study showed moderate OSA but he has been unable to tolerated CPAP (using oral device).  He has started on Adcirca and felt like it helped.   - We discussed increasing Adcirca and potentially adding an ERA.  I would like to repeat RHC for reassessment of PA pressure and PVR prior to doing this.  We will plan on RHC next week.  I explained risks/benefits of procedure and he is willing to have it done.  7. OSA: Moderate.  Has not tolerated CPAP. Probably plays a role in recurrent atrial fibrillation.  He now uses and mouthpiece at night and feels like this helps.  8. Carotid stenosis: Stable carotid dopplers, repeat in 4/18.   9. Presyncope: Earlier this week, profound lightheadedness without palpitations.  Given his history, concerned for arrhythmia but cannot rule out vagal episode.  We discussed ICD again, he is going to think about it.  In the meantime, I am going to have him wear a 30 day  monitor.   Loralie Champagne 01/05/2016

## 2016-01-13 ENCOUNTER — Encounter (HOSPITAL_COMMUNITY): Payer: Self-pay | Admitting: Cardiology

## 2016-01-13 ENCOUNTER — Other Ambulatory Visit: Payer: Self-pay | Admitting: Internal Medicine

## 2016-01-14 ENCOUNTER — Telehealth (HOSPITAL_COMMUNITY): Payer: Self-pay | Admitting: Pharmacist

## 2016-01-14 MED ORDER — TADALAFIL (PAH) 20 MG PO TABS: 40.0000 mg | ORAL_TABLET | Freq: Every day | ORAL | 11 refills | Status: DC

## 2016-01-14 MED FILL — ADCIRCA 20 MG TABLET: 20 | 30 days supply | Qty: 60 | Fill #0

## 2016-01-14 NOTE — Telephone Encounter (Signed)
Otho who stated that would be calling [name] today to verify shipment of [employer].   [name], PharmD, BCPS, CPP Clinical Pharmacist Pager: [phone] Phone: [phone] 01/14/2016 11:55 AM

## 2016-01-16 ENCOUNTER — Other Ambulatory Visit (HOSPITAL_COMMUNITY): Payer: Self-pay | Admitting: Cardiology

## 2016-01-16 ENCOUNTER — Ambulatory Visit (INDEPENDENT_AMBULATORY_CARE_PROVIDER_SITE_OTHER): Payer: Medicare Other

## 2016-01-16 DIAGNOSIS — I472 Ventricular tachycardia, unspecified: Secondary | ICD-10-CM

## 2016-01-16 DIAGNOSIS — R42 Dizziness and giddiness: Secondary | ICD-10-CM

## 2016-01-21 MED FILL — BISOPROLOL FUMARATE 5 MG TA: 5 | 30 days supply | Qty: 15 | Fill #0

## 2016-01-24 MED FILL — POTASSIUM CL 10 MEQ TAB SA: 10 | 30 days supply | Qty: 15 | Fill #2

## 2016-01-24 MED FILL — FUROSEMIDE 40 MG TABLET: 40 | 30 days supply | Qty: 90 | Fill #1

## 2016-01-28 ENCOUNTER — Ambulatory Visit (HOSPITAL_COMMUNITY)
Admission: RE | Admit: 2016-01-28 | Discharge: 2016-01-28 | Disposition: A | Discharge status: Home / Self Care | Payer: Medicare Other | Source: Ambulatory Visit | Attending: Cardiology | Admitting: Cardiology

## 2016-01-28 VITALS — BP 136/54 | HR 67 | Wt 157.8 lb

## 2016-01-28 DIAGNOSIS — I255 Ischemic cardiomyopathy: Secondary | ICD-10-CM | POA: Insufficient documentation

## 2016-01-28 DIAGNOSIS — E785 Hyperlipidemia, unspecified: Secondary | ICD-10-CM | POA: Insufficient documentation

## 2016-01-28 DIAGNOSIS — Z79899 Other long term (current) drug therapy: Secondary | ICD-10-CM | POA: Insufficient documentation

## 2016-01-28 DIAGNOSIS — I872 Venous insufficiency (chronic) (peripheral): Secondary | ICD-10-CM | POA: Insufficient documentation

## 2016-01-28 DIAGNOSIS — G629 Polyneuropathy, unspecified: Secondary | ICD-10-CM | POA: Diagnosis not present

## 2016-01-28 DIAGNOSIS — I2510 Atherosclerotic heart disease of native coronary artery without angina pectoris: Secondary | ICD-10-CM | POA: Insufficient documentation

## 2016-01-28 DIAGNOSIS — I482 Chronic atrial fibrillation, unspecified: Secondary | ICD-10-CM

## 2016-01-28 DIAGNOSIS — G4733 Obstructive sleep apnea (adult) (pediatric): Secondary | ICD-10-CM | POA: Diagnosis not present

## 2016-01-28 DIAGNOSIS — I484 Atypical atrial flutter: Secondary | ICD-10-CM | POA: Insufficient documentation

## 2016-01-28 DIAGNOSIS — I10 Essential (primary) hypertension: Secondary | ICD-10-CM | POA: Diagnosis not present

## 2016-01-28 DIAGNOSIS — Z8249 Family history of ischemic heart disease and other diseases of the circulatory system: Secondary | ICD-10-CM | POA: Insufficient documentation

## 2016-01-28 DIAGNOSIS — I2721 Secondary pulmonary arterial hypertension: Secondary | ICD-10-CM | POA: Insufficient documentation

## 2016-01-28 DIAGNOSIS — Z8673 Personal history of transient ischemic attack (TIA), and cerebral infarction without residual deficits: Secondary | ICD-10-CM | POA: Insufficient documentation

## 2016-01-28 DIAGNOSIS — Z9889 Other specified postprocedural states: Secondary | ICD-10-CM | POA: Insufficient documentation

## 2016-01-28 DIAGNOSIS — I252 Old myocardial infarction: Secondary | ICD-10-CM | POA: Diagnosis not present

## 2016-01-28 DIAGNOSIS — I272 Pulmonary hypertension, unspecified: Secondary | ICD-10-CM

## 2016-01-28 DIAGNOSIS — I340 Nonrheumatic mitral (valve) insufficiency: Secondary | ICD-10-CM | POA: Insufficient documentation

## 2016-01-28 DIAGNOSIS — Z951 Presence of aortocoronary bypass graft: Secondary | ICD-10-CM

## 2016-01-28 DIAGNOSIS — I480 Paroxysmal atrial fibrillation: Secondary | ICD-10-CM | POA: Insufficient documentation

## 2016-01-28 DIAGNOSIS — I6523 Occlusion and stenosis of bilateral carotid arteries: Secondary | ICD-10-CM | POA: Insufficient documentation

## 2016-01-28 DIAGNOSIS — Z7982 Long term (current) use of aspirin: Secondary | ICD-10-CM | POA: Diagnosis not present

## 2016-01-28 DIAGNOSIS — I5022 Chronic systolic (congestive) heart failure: Secondary | ICD-10-CM

## 2016-01-28 DIAGNOSIS — K219 Gastro-esophageal reflux disease without esophagitis: Secondary | ICD-10-CM | POA: Insufficient documentation

## 2016-01-28 LAB — BASIC METABOLIC PANEL
Anion gap: 9 (ref 5–15)
BUN: 19 mg/dL (ref 6–20)
CHLORIDE: 101 mmol/L (ref 101–111)
CO2: 26 mmol/L (ref 22–32)
CREATININE: 1.12 mg/dL (ref 0.61–1.24)
Calcium: 9.5 mg/dL (ref 8.9–10.3)
Glucose, Bld: 97 mg/dL (ref 65–99)
Potassium: 4.3 mmol/L (ref 3.5–5.1)
SODIUM: 136 mmol/L (ref 135–145)

## 2016-01-28 LAB — DIGOXIN LEVEL: DIGOXIN LVL: 0.5 ng/mL — AB (ref 0.8–2.0)

## 2016-01-28 NOTE — Patient Instructions (Signed)
Labs today  You have been referred to Dr Caryl Comes  Your physician recommends that you schedule a follow-up appointment in: 1 month

## 2016-01-28 NOTE — Progress Notes (Signed)
Patient ID: Caleb Moment, PhD, male   DOB: 12-Jun-1940, 75 y.o.   MRN: [id number]    Advanced Heart Failure Clinic Note   PCP: [name] EP: [name] Cardiology: [name]  75 yo with complex past history presents for heart failure evaluation.  Patient had anterior MI in 2004 and developed ischemic cardiomyopathy as well as mitral regurgitation. He also developed atrial fibrillation.  In 10/14, he had MV repair, Maze, CABG with LIMA-LAD, and LA appendage closure at [facility] in [place].  Subsequently, atrial fibrillation returned and he had an atrial fibrillation ablation in [facility] in 3/15.  He wore a Zio patch in 12/15 and had a low atrial fibrillation burden of 7%.  He has had a long-standing ischemic cardiomyopathy.  In 2015, EF was 20-25%.  Echo in 2016 also showed EF 25-30% but estimated PA pressure suggested severe pulmonary hypertension, which is new for him.    At initial appointment, he reported increased exertional dyspnea over a number of weeks.  RHC was done, showing mildly elevated PCWP with moderate pulmonary HTN and low cardiac index (1.93 thermo, 2.13 Fick).  V/Q scan showed no PE.  I started him on digoxin and have titrated his Lasix to 80 mg daily.  He is now on bisoprolol and able to tolerate it.  At last appointment, I had him try replacing valsartan with Entresto 24/26 bid.  He was unable to tolerate it (made him dizzy, though BP was not low).  Therefore, I had him restart valsartan.  CPX in 4/16 showed mildly decreased functional capacity.  He has tolerated Adcirca 20 mg daily.  He did not tolerate an attempt to uptitrate bisoprolol.  He tried CPAP but was unable to tolerate it.  He was unable to tolerate eplerenone 50 mg daily.  Last echo in 9/17 showed EF 20-25%, stable MV repair, normal RV, and PA systolic pressure 86 mmHg.   He had a Lexiscan Cardiolite in 9/17 that showed infarction, no ischemia.  [place] in 10/17 showed severe mixed pulmonary venous HTN/pulmonary  arterial HTN with PVR 4.3 WU and PCWP mildly elevated.  Adcirca was increased to 40 mg daily and Lasix was increased to 80 qam/40 qpm.  Today, he is in an atypical atrial flutter.  He feels this.  When he feels palpitations concerning for atrial fibrillation or flutter, he feels "stuffy," "foggy," and tired.  When he feels like he is in normal rhythm, he is doing ok with no dyspnea on flat ground.  No orthopnea/PND. Weight is stable.   ECG: Atypical atrial flutter, IVCD 132 msec, QTc 442 msec.   6 minute walk (3/16): 381 m  6 minute walk (5/16): 414.5 m 6 minute walk (10/16): 562 m 6 minute walk (1/17): 469 m 6 minute walk (8/17): 488 m  Labs (2/15): LDL 144 Labs (8/15): K 4.6, creatinine 0.9 Labs (12/15): HCT 42.3   Labs (2/16): K 4 => 4.2, creatinine 1.05 => 0.92, BNP 268 Labs (3/16): BNP 495 => 320, digoxin 0.4, RF 14.7 (very mild increase), TSH normal, HIV negative, anti-SCL70 negative, creatinine 0.91, K 4.4 Labs (7/16): K 5, creatinine 1.05, BNP 455, vitamin D normal, digoxin 0.4, B12 normal Labs (8/16): digoxin 0.8, BNP 305, K 4.2, creatinine 0.99 Labs (9/16): HCT 43.3, TSH normal, BNP 492 => 278, K 4.3, creatinine 0.97 => 1.04, digoxin 0.6, TSH normal, LDL 154, LDL-P 1654.  Labs (10/16): K 4.7, creatinine 1.1 Labs (1/17): pro-BNP 2065, digoxin 0.3, K 4.3, creatinine 1.10 => 1.28  Labs (3/17): K 4.1, creatinine 1.09, HCT 38.3 Labs (4/17): K 4.4, creatinine 0.99, digoxin 0.8 Labs (5/17): K 4.3, creatinine 1.08, BNP 317, hgb 13.1 Labs (8/17): K 4.6, creatinine 0.99, BNP 392, digoxin 0.5 Labs (9/17): K 4.4, creatinine 1.05 Labs (10/17): digoxin 0.5, K 4.1, creatinine 1.12, HCT 44.9, digoxin 0.5  PMH: 1. CAD: Anterior MI in 2004.  Cardiac surgery in 10/14 included LIMA-LAD.  - Lexiscan Cardiolite (9/17): EF 35%, infarct present with no ischemia.  2. Chronic mitral regurgitation: 10/14 surgery at [facility] with MV repair, Maze, LIMA-LAD, and LA appendage closure.  3. Atrial  fibrillation: Paroxysmal.  He was initially on Tikosyn but had breakthrough atrial fibrillation.  H/o Maze in 2014.  Had recurrent atrial fibrillation with ablation in 3/15 by Dr Ola Spurr in [facility].  Not anticoagulated after 2 severe prostate bleeding episodes.  LA appendage was clipped with MV surgery.  - Zio patch in 12/15 with low atrial fibrillation burden (7%).  - Holter (9/16) with PACs, PVCs, short atrial fibrillation runs (nothing sustained). 4. Chronotropic incompetence.  5. HTN 6. Hyperlipidemia: Refuses statin.  7. Peripheral neuropathy 8. GERD 9. H/o BPPV 10. H/o TIA 11. Ischemic cardiomyopathy: cardiac MRI 7/14 with EF 35%, moderate MR, normal RV size and systolic function, extensive anterior and anteroseptal LGE suggestive of non-viable myocardium (this was prior to LIMA-LAD).  Echo 1/15 with EF 20-25%, moderate AI, PA systolic pressure 39 mmHg. Echo (1/16) with EF 20-25%, diffuse hypokinesis with regionality, moderate LV dilation, moderate AI, s/p MV repair with mild MR and normal gradients, RV dilated with mildly decreased systolic function, PA systolic pressure 71 mmHg.   - RHC (2/16) with mean RA 9, PA 61/25 mean 40, mean PCWP 23, CI 2.13/PVR 4.3 (Fick), CI 1.93/PVR 4.7 (thermo).   - CPX (4/16) with peak VO2 17.9, VE/VCO2 34.7 => mildly decreased functional capacity.  - Spironolactone apparently caused cognitive deficits - Intolerant of Coreg due to development of severe alopecia - Unable to uptitrate bisoprolol due to intolerance.  - Lightheaded with Entresto.  - Unable to tolerate increase in valsartan to 80 mg bid.  - Echo (1/17) with EF 30-35%, moderate LV dilation, mild AI, s/p MV repair with mild MR, moderately dilated RV with mildly decreased systolic function, PA systolic pressure 69 mmHg.  - CPX (4/17): peak VO2 18, VE/VCO2 slope 33, RER 1.28 => mild to moderate functional impairment, mildly improved.  - Echo (9/17): EF 20-25% with regional WMAs, normal RV size  and systolic function, PASP 86 mmHg, stable repaired mitral valve with mild MR, moderate AI.  - RHC (10/17): mean RA 9, PA 70/23 mean 43, PCWP mean 20, CI 2.96 Fick/2.84 Thermo, PVR 4.3 WU.  12. Carotid stenosis: Carotid dopplers (1/16) with 40-59% bilateral ICA stenosis. Carotid dopplers (4/17) with 40-59% BICA stenosis.  13. Aortic insufficiency: Moderate by last echo 9/17.  14. Pulmonary HTN: Mixed PAH and pulmonary venous hypertension.  PFTs (9/14) were normal.  V/Q scan (2/16) with no evidence of acute or chronic PE.  6 minute walk (3/16) 381 m. 6 minute walk (5/16) 414.5 m. 6 minute walk (1/17) 469 m.  15. OSA: Moderate on 5/16 sleep study. Unable to tolerate CPAP.  16. Venous insufficiency 17. Ventral hernia  SH: Married, lives in [place], Engineer, water, nonsmoker  FH: CAD  ROS: All systems reviewed and negative except as per HPI.   Current Outpatient Prescriptions  Medication Sig Dispense Refill  . aspirin 81 MG chewable tablet Chew 81 mg by mouth daily.    Marland Kitchen  bisoprolol (ZEBETA) 5 MG tablet Take 0.5 tablets (2.5 mg total) by mouth daily. 15 tablet 6  . digoxin (LANOXIN) 0.125 MG tablet TAKE 1 TABLET BY MOUTH ONCE DAILY 90 tablet 3  . eplerenone (INSPRA) 50 MG tablet Take 25 mg by mouth daily.    . famotidine (PEPCID) 20 MG tablet Take 20 mg by mouth daily as needed for heartburn or indigestion.    . furosemide (LASIX) 40 MG tablet TAKE 2 TABLETS BY MOUTH EVERY MORNING AND 1 TABLET EVERY EVENING 90 tablet 6  . Magnesium 100 MG CAPS Take 1 capsule by mouth 3 (three) times daily.     . Multiple Vitamins-Minerals (MULTIVITAMIN WITH MINERALS) tablet Take 1 tablet by mouth daily.    . nitroGLYCERIN (NITROSTAT) 0.4 MG SL tablet Place 1 tablet (0.4 mg total) under the tongue every 5 (five) minutes as needed for chest pain. 25 tablet 3  . Omega-3 Fatty Acids (THE VERY FINEST FISH OIL) LIQD Take 2-3 g by mouth daily.     . potassium chloride (K-DUR,KLOR-CON) 10 MEQ tablet Take 10 mEq by  mouth 2 (two) times a week.   3  . tadalafil, PAH, (ADCIRCA) 20 MG tablet Take 2 tablets (40 mg total) by mouth daily. 60 tablet 11  . valsartan (DIOVAN) 40 MG tablet TAKE 1 TABLET BY MOUTH EVERY MORNING AND 2 TABLETS EVERY EVENING 90 tablet 3  . Zinc 100 MG TABS Take 1 tablet by mouth daily.      No current facility-administered medications for this encounter.    BP (!) 136/54 (BP Location: Left Arm, Patient Position: Sitting, Cuff Size: Normal)   Pulse 67   Wt 157 lb 12 oz (71.6 kg)   SpO2 97%   BMI 22.63 kg/m    Wt Readings from Last 3 Encounters:  01/28/16 157 lb 12 oz (71.6 kg)  01/10/16 152 lb (68.9 kg)  01/03/16 156 lb (70.8 kg)     General: NAD Neck: No JVD, no thyromegaly or thyroid nodule.  Lungs: Clear to auscultation bilaterally with normal respiratory effort. CV: Lateral PMI.  Heart irregular S1/S2, no S3/S4, 2/6 HSM LLSB.  1+ left ankle edema, no right ankle edema.  No carotid bruit.  Normal pedal pulses.  Abdomen: Soft, NT, ND, no HSM. No bruits or masses. +BS.  Small ventral hernia, reducible.  Neurologic: Alert and oriented x 3.  Psych: Normal affect. Extremities: No clubbing or cyanosis.  HEENT: Normal.   Assessment/Plan: 1. CAD: S/p LIMA-LAD.  He is on ASA 81.  He has decided not to take statins after reviewing the data (I did recommend taking a statin but we have agreed to disagree on this).  Lexiscan Cardiolite was done in 9/17 due to atypical chest pain.  This showed prior infarction with no ischemia.  No recurrence of chest pain since that time despite ongoing exercise.   2. S/p mitral valve repair: The MV repair looked stable on 9/17 echo with no evidence for significant mitral stenosis.  3. Aortic insufficiency: Moderate on 9/17 echo.  Continue to follow.  4. Chronic systolic CHF: Ischemic cardiomyopathy, EF 20-25% on 9/17 echo.  PA pressure remains elevated by doppler measurement but RV systolic function was normal.  Mildly decreased functional capacity on  4/16 CPX, this was mildly improved on 4/17 CPX.  NYHA class II. He does not appear volume overloaded today.   - Continue current Lasix dosing.  BMET today.  - Continue digoxin, check level today.  - Continue bisoprolol 2.5 mg  daily (unable to tolerate increase).   - Continue valsartan 40 qam/80 qpm.  He did not tolerate Entresto or further uptitration of valsartan.    - Continue eplerenone 25 (unable to tolerate increase).        - He has refused ICD in the past.  - In the future, he could be an LVAD candidate if he worsens.   5. Atrial fibrillation: s/p Maze in 10/14, then ablation in 3/15.  Prior to Maze, he was on Tikosyn but had breakthrough.  Currently, he is not anticoagulated due to history of prostate bleeding and his choice.  He did have his LA appendage clipped at time of MV surgery.  He has felt more atrial fibrillation/flutter recently.  He is symptomatic with the arrhythmia (feels considerably more tired).  Today, he is in atypical atrial flutter.  He feels it. - Continue bisoprolol.  - He is wearing an event monitor => to try to quantify arrhythmia burden. - We had a discussion about what to do with his atrial fibrillation.  He is symptomatic when in atrial fibrillation/fluttter.  He had breakthrough on Tikosyn in the past, but has had Maze and atrial fibrillation ablation since that time.  I suggested anticoagulation with Eliquis and re-attempting Tikosyn.  He thinks that he may want to do this but is not sure.  I suggested that he talk with [name] also to see if he agrees => I will make an appointment with [name] for him.  He will let me know if he wants to start Eliquis and be admitted for Tikosyn.  6. Pulmonary hypertension: Severe by most recent echo in 9/17 and on RHC in 10/17.  RV actually looked ok on 9/17 echo.  Mixed pulmonary venous and pulmonary arterial HTN on RHC.  It is possible that the [facility] component is due to pulmonary vascular remodeling in the setting of chronic  mitral regurgitation prior to MV repair. Negative V/Q scan, no evidence for CTEPH.  PFTs normal in 9/14.  He is seeing Dr Lake Bells.  Sleep study showed moderate OSA but he has been unable to tolerated CPAP (using oral device).   - I increased Adcirca to 40 mg daily after RHC.   - Given advantage of combination therapy, we will need to address adding an ERA in the future.  7. OSA: Moderate.  Has not tolerated CPAP. Probably plays a role in recurrent atrial fibrillation.  He now uses and mouthpiece at night and feels like this helps.  8. Carotid stenosis: Stable carotid dopplers, repeat in 4/18.   9. Presyncope: No recurrence.  He is wearing a 30 day monitor.   Followup in 1 month and will arrange appt with Dr.Klein.   Loralie Champagne 01/28/2016

## 2016-02-03 MED FILL — VALSARTAN 40 MG TABLET: 40 | 30 days supply | Qty: 90 | Fill #3

## 2016-02-04 ENCOUNTER — Telehealth (HOSPITAL_COMMUNITY): Payer: Self-pay | Admitting: *Deleted

## 2016-02-04 DIAGNOSIS — I5022 Chronic systolic (congestive) heart failure: Secondary | ICD-10-CM

## 2016-02-04 MED ORDER — TORSEMIDE 20 MG PO TABS: 80.0000 mg | ORAL_TABLET | Freq: Every day | ORAL | 3 refills | Status: DC

## 2016-02-04 MED FILL — TORSEMIDE 20 MG TABLET: 20 | 30 days supply | Qty: 120 | Fill #0

## 2016-02-04 NOTE — Addendum Note (Signed)
Addended by: Scarlette Calico on: 02/04/2016 04:28 PM   Modules accepted: Orders

## 2016-02-04 NOTE — Telephone Encounter (Signed)
Pt called c/o of wheezing, sob last night.  He states it woke him up around 2 am, he was wheezing, felt SOB, could hear rattling when he would breathe and states it felt like "chords vibrating"  He also reports wt gain, up 5 lbs from last week, to 158 lb today.  He states he feels like the Lasix is no longer working for him and yesterday he only took 80 mg in AM instead of his usual dose of 80 am and 40 pm and his wt was actually down 1 lb this AM.  Discussed all above w/Dr Aundra Dubin, he recommends pt stop Lasix and start Torsemide 80 mg daily, have bmet Fri and may need to be seen by Dr Aundra Dubin next week.  Pt aware and agreeable, rx sent in bmet sch for Fri, he will see how he is doing and sch appt when he comes in on Fri for labs

## 2016-02-07 ENCOUNTER — Encounter: Payer: Self-pay | Admitting: Internal Medicine

## 2016-02-07 ENCOUNTER — Ambulatory Visit (HOSPITAL_COMMUNITY)
Admission: RE | Admit: 2016-02-07 | Discharge: 2016-02-07 | Disposition: A | Discharge status: Home / Self Care | Payer: Medicare Other | Source: Ambulatory Visit | Attending: Cardiology | Admitting: Cardiology

## 2016-02-07 DIAGNOSIS — I5022 Chronic systolic (congestive) heart failure: Secondary | ICD-10-CM | POA: Diagnosis not present

## 2016-02-07 LAB — BASIC METABOLIC PANEL
ANION GAP: 9 (ref 5–15)
BUN: 22 mg/dL — ABNORMAL HIGH (ref 6–20)
CALCIUM: 9.4 mg/dL (ref 8.9–10.3)
CO2: 29 mmol/L (ref 22–32)
CREATININE: 1.3 mg/dL — AB (ref 0.61–1.24)
Chloride: 100 mmol/L — ABNORMAL LOW (ref 101–111)
GFR calc non Af Amer: 51 mL/min — ABNORMAL LOW (ref >60)
GFR, EST AFRICAN AMERICAN: 59 mL/min — AB (ref >60)
Glucose, Bld: 100 mg/dL — ABNORMAL HIGH (ref 65–99)
Potassium: 4 mmol/L (ref 3.5–5.1)
SODIUM: 138 mmol/L (ref 135–145)

## 2016-02-10 ENCOUNTER — Encounter (HOSPITAL_COMMUNITY): Payer: Self-pay

## 2016-02-10 ENCOUNTER — Ambulatory Visit (HOSPITAL_COMMUNITY)
Admission: RE | Admit: 2016-02-10 | Discharge: 2016-02-10 | Disposition: A | Discharge status: Home / Self Care | Payer: Medicare Other | Source: Ambulatory Visit | Attending: Cardiology | Admitting: Cardiology

## 2016-02-10 ENCOUNTER — Encounter: Payer: Self-pay | Admitting: Internal Medicine

## 2016-02-10 ENCOUNTER — Ambulatory Visit (INDEPENDENT_AMBULATORY_CARE_PROVIDER_SITE_OTHER): Payer: Medicare Other | Admitting: Internal Medicine

## 2016-02-10 VITALS — BP 126/58 | HR 55 | Ht 70.0 in | Wt 156.0 lb

## 2016-02-10 VITALS — BP 122/50 | HR 56 | Wt 156.8 lb

## 2016-02-10 DIAGNOSIS — I255 Ischemic cardiomyopathy: Secondary | ICD-10-CM | POA: Diagnosis not present

## 2016-02-10 DIAGNOSIS — I482 Chronic atrial fibrillation, unspecified: Secondary | ICD-10-CM

## 2016-02-10 DIAGNOSIS — Z951 Presence of aortocoronary bypass graft: Secondary | ICD-10-CM

## 2016-02-10 DIAGNOSIS — G629 Polyneuropathy, unspecified: Secondary | ICD-10-CM | POA: Diagnosis not present

## 2016-02-10 DIAGNOSIS — Z8673 Personal history of transient ischemic attack (TIA), and cerebral infarction without residual deficits: Secondary | ICD-10-CM | POA: Diagnosis not present

## 2016-02-10 DIAGNOSIS — Z7982 Long term (current) use of aspirin: Secondary | ICD-10-CM | POA: Insufficient documentation

## 2016-02-10 DIAGNOSIS — I6523 Occlusion and stenosis of bilateral carotid arteries: Secondary | ICD-10-CM | POA: Diagnosis not present

## 2016-02-10 DIAGNOSIS — E785 Hyperlipidemia, unspecified: Secondary | ICD-10-CM | POA: Diagnosis not present

## 2016-02-10 DIAGNOSIS — I252 Old myocardial infarction: Secondary | ICD-10-CM | POA: Insufficient documentation

## 2016-02-10 DIAGNOSIS — I4892 Unspecified atrial flutter: Secondary | ICD-10-CM | POA: Insufficient documentation

## 2016-02-10 DIAGNOSIS — I2510 Atherosclerotic heart disease of native coronary artery without angina pectoris: Secondary | ICD-10-CM | POA: Insufficient documentation

## 2016-02-10 DIAGNOSIS — I5032 Chronic diastolic (congestive) heart failure: Secondary | ICD-10-CM | POA: Diagnosis not present

## 2016-02-10 DIAGNOSIS — K219 Gastro-esophageal reflux disease without esophagitis: Secondary | ICD-10-CM | POA: Insufficient documentation

## 2016-02-10 DIAGNOSIS — I480 Paroxysmal atrial fibrillation: Secondary | ICD-10-CM | POA: Insufficient documentation

## 2016-02-10 DIAGNOSIS — Z79899 Other long term (current) drug therapy: Secondary | ICD-10-CM | POA: Diagnosis not present

## 2016-02-10 DIAGNOSIS — I2589 Other forms of chronic ischemic heart disease: Secondary | ICD-10-CM

## 2016-02-10 DIAGNOSIS — Z8249 Family history of ischemic heart disease and other diseases of the circulatory system: Secondary | ICD-10-CM | POA: Insufficient documentation

## 2016-02-10 DIAGNOSIS — I110 Hypertensive heart disease with heart failure: Secondary | ICD-10-CM | POA: Insufficient documentation

## 2016-02-10 DIAGNOSIS — Z9889 Other specified postprocedural states: Secondary | ICD-10-CM

## 2016-02-10 DIAGNOSIS — I272 Pulmonary hypertension, unspecified: Secondary | ICD-10-CM

## 2016-02-10 DIAGNOSIS — G4733 Obstructive sleep apnea (adult) (pediatric): Secondary | ICD-10-CM | POA: Insufficient documentation

## 2016-02-10 DIAGNOSIS — I340 Nonrheumatic mitral (valve) insufficiency: Secondary | ICD-10-CM | POA: Insufficient documentation

## 2016-02-10 DIAGNOSIS — I2721 Secondary pulmonary arterial hypertension: Secondary | ICD-10-CM | POA: Diagnosis not present

## 2016-02-10 DIAGNOSIS — I428 Other cardiomyopathies: Secondary | ICD-10-CM

## 2016-02-10 DIAGNOSIS — I872 Venous insufficiency (chronic) (peripheral): Secondary | ICD-10-CM | POA: Diagnosis not present

## 2016-02-10 DIAGNOSIS — I5022 Chronic systolic (congestive) heart failure: Secondary | ICD-10-CM | POA: Diagnosis not present

## 2016-02-10 LAB — BASIC METABOLIC PANEL
ANION GAP: 11 (ref 5–15)
BUN: 21 mg/dL — ABNORMAL HIGH (ref 6–20)
CHLORIDE: 100 mmol/L — AB (ref 101–111)
CO2: 27 mmol/L (ref 22–32)
CREATININE: 1.16 mg/dL (ref 0.61–1.24)
Calcium: 9.8 mg/dL (ref 8.9–10.3)
GFR calc non Af Amer: 59 mL/min — ABNORMAL LOW (ref >60)
Glucose, Bld: 110 mg/dL — ABNORMAL HIGH (ref 65–99)
Potassium: 3.8 mmol/L (ref 3.5–5.1)
SODIUM: 138 mmol/L (ref 135–145)

## 2016-02-10 LAB — BRAIN NATRIURETIC PEPTIDE: B NATRIURETIC PEPTIDE 5: 295.8 pg/mL — AB (ref 0.0–100.0)

## 2016-02-10 NOTE — Progress Notes (Signed)
Patient ID: Caleb Moment, PhD, male   DOB: 1940/06/19, 75 y.o.   MRN: [id number]    Advanced Heart Failure Clinic Note   PCP: [name] EP: [name] Cardiology: [name]  75 yo with complex past history presents for heart failure evaluation.  Patient had anterior MI in 2004 and developed ischemic cardiomyopathy as well as mitral regurgitation. He also developed atrial fibrillation.  In 10/14, he had MV repair, Maze, CABG with LIMA-LAD, and LA appendage closure at [facility] in [place].  Subsequently, atrial fibrillation returned and he had an atrial fibrillation ablation in [facility] in 3/15.  He wore a Zio patch in 12/15 and had a low atrial fibrillation burden of 7%.  He has had a long-standing ischemic cardiomyopathy.  In 2015, EF was 20-25%.  Echo in 2016 also showed EF 25-30% but estimated PA pressure suggested severe pulmonary hypertension, which is new for him.    At initial appointment, he reported increased exertional dyspnea over a number of weeks.  RHC was done, showing mildly elevated PCWP with moderate pulmonary HTN and low cardiac index (1.93 thermo, 2.13 Fick).  V/Q scan showed no PE.  I started him on digoxin and have titrated his Lasix to 80 mg daily.  He is now on bisoprolol and able to tolerate it.  At last appointment, I had him try replacing valsartan with Entresto 24/26 bid.  He was unable to tolerate it (made him dizzy, though BP was not low).  Therefore, I had him restart valsartan.  CPX in 4/16 showed mildly decreased functional capacity.  He has tolerated Adcirca 20 mg daily.  He did not tolerate an attempt to uptitrate bisoprolol.  He tried CPAP but was unable to tolerate it.  He was unable to tolerate eplerenone 50 mg daily.  Last echo in 9/17 showed EF 20-25%, stable MV repair, normal RV, and PA systolic pressure 86 mmHg.   He had a Lexiscan Cardiolite in 9/17 that showed infarction, no ischemia.  [place] in 10/17 showed severe mixed pulmonary venous HTN/pulmonary  arterial HTN with PVR 4.3 WU and PCWP mildly elevated.  Adcirca was increased to 40 mg daily and Lasix was increased to 80 qam/40 qpm.  Most recently, I switched him over to torsemide because of increased weight.    Recently, he has had more atrial arrhythmias. Event monitor was completed today, had recurrent episodes of fibrillation and flutter.  Today, he is in NSR.   He feels like he is starting to get used to torsemide.  Initially, it made him feel "washed out," but that sensation is starting to pass.  He feels when he is in fibrillation/flutter, and will be more fatigued.  No dyspnea on flat ground.  Weight down 1 lb.  No chest pain.  No orthopnea/PND.    ECG: NSR, left axis deviation, QTc 454 msec, lateral/anterolateral T wave inversions   6 minute walk (3/16): 381 m  6 minute walk (5/16): 414.5 m 6 minute walk (10/16): 562 m 6 minute walk (1/17): 469 m 6 minute walk (8/17): 488 m  Labs (2/15): LDL 144 Labs (8/15): K 4.6, creatinine 0.9 Labs (12/15): HCT 42.3   Labs (2/16): K 4 => 4.2, creatinine 1.05 => 0.92, BNP 268 Labs (3/16): BNP 495 => 320, digoxin 0.4, RF 14.7 (very mild increase), TSH normal, HIV negative, anti-SCL70 negative, creatinine 0.91, K 4.4 Labs (7/16): K 5, creatinine 1.05, BNP 455, vitamin D normal, digoxin 0.4, B12 normal Labs (8/16): digoxin 0.8, BNP 305,  K 4.2, creatinine 0.99 Labs (9/16): HCT 43.3, TSH normal, BNP 492 => 278, K 4.3, creatinine 0.97 => 1.04, digoxin 0.6, TSH normal, LDL 154, LDL-P 1654.  Labs (10/16): K 4.7, creatinine 1.1 Labs (1/17): pro-BNP 2065, digoxin 0.3, K 4.3, creatinine 1.10 => 1.28 Labs (3/17): K 4.1, creatinine 1.09, HCT 38.3 Labs (4/17): K 4.4, creatinine 0.99, digoxin 0.8 Labs (5/17): K 4.3, creatinine 1.08, BNP 317, hgb 13.1 Labs (8/17): K 4.6, creatinine 0.99, BNP 392, digoxin 0.5 Labs (9/17): K 4.4, creatinine 1.05 Labs (10/17): digoxin 0.5, K 4.1, creatinine 1.12, HCT 44.9, digoxin 0.5 Labs (11/17): K 4, creatinine 1.3,  digoxin 0.5  PMH: 1. CAD: Anterior MI in 2004.  Cardiac surgery in 10/14 included LIMA-LAD.  - Lexiscan Cardiolite (9/17): EF 35%, infarct present with no ischemia.  2. Chronic mitral regurgitation: 10/14 surgery at [facility] with MV repair, Maze, LIMA-LAD, and LA appendage closure.  3. Atrial fibrillation: Paroxysmal.  He was initially on Tikosyn but had breakthrough atrial fibrillation.  H/o Maze in 2014.  Had recurrent atrial fibrillation with ablation in 3/15 by Dr Ola Spurr in [facility]  Not anticoagulated after 2 severe prostate bleeding episodes.  LA appendage was clipped with MV surgery.  - Zio patch in 12/15 with low atrial fibrillation burden (7%).  - Holter (9/16) with PACs, PVCs, short atrial fibrillation runs (nothing sustained). - Event monitor (11/17) with runs of atrial fibrillation and flutter.   4. Chronotropic incompetence.  5. HTN 6. Hyperlipidemia: Refuses statin.  7. Peripheral neuropathy 8. GERD 9. H/o BPPV 10. H/o TIA 11. Ischemic cardiomyopathy: cardiac MRI 7/14 with EF 35%, moderate MR, normal RV size and systolic function, extensive anterior and anteroseptal LGE suggestive of non-viable myocardium (this was prior to LIMA-LAD).  Echo 1/15 with EF 20-25%, moderate AI, PA systolic pressure 39 mmHg. Echo (1/16) with EF 20-25%, diffuse hypokinesis with regionality, moderate LV dilation, moderate AI, s/p MV repair with mild MR and normal gradients, RV dilated with mildly decreased systolic function, PA systolic pressure 71 mmHg.   - RHC (2/16) with mean RA 9, PA 61/25 mean 40, mean PCWP 23, CI 2.13/PVR 4.3 (Fick), CI 1.93/PVR 4.7 (thermo).   - CPX (4/16) with peak VO2 17.9, VE/VCO2 34.7 => mildly decreased functional capacity.  - Spironolactone apparently caused cognitive deficits - Intolerant of Coreg due to development of severe alopecia - Unable to uptitrate bisoprolol due to intolerance.  - Lightheaded with Entresto.  - Unable to tolerate increase in valsartan  to 80 mg bid.  - Echo (1/17) with EF 30-35%, moderate LV dilation, mild AI, s/p MV repair with mild MR, moderately dilated RV with mildly decreased systolic function, PA systolic pressure 69 mmHg.  - CPX (4/17): peak VO2 18, VE/VCO2 slope 33, RER 1.28 => mild to moderate functional impairment, mildly improved.  - Echo (9/17): EF 20-25% with regional WMAs, normal RV size and systolic function, PASP 86 mmHg, stable repaired mitral valve with mild MR, moderate AI.  - RHC (10/17): mean RA 9, PA 70/23 mean 43, PCWP mean 20, CI 2.96 Fick/2.84 Thermo, PVR 4.3 WU.  12. Carotid stenosis: Carotid dopplers (1/16) with 40-59% bilateral ICA stenosis. Carotid dopplers (4/17) with 40-59% BICA stenosis.  13. Aortic insufficiency: Moderate by last echo 9/17.  14. Pulmonary HTN: Mixed PAH and pulmonary venous hypertension.  PFTs (9/14) were normal.  V/Q scan (2/16) with no evidence of acute or chronic PE.  6 minute walk (3/16) 381 m. 6 minute walk (5/16) 414.5 m. 6 minute walk (  1/17) 469 m.  15. OSA: Moderate on 5/16 sleep study. Unable to tolerate CPAP.  16. Venous insufficiency 17. Ventral hernia  SH: Married, lives in [place], Engineer, water, nonsmoker  FH: CAD  ROS: All systems reviewed and negative except as per HPI.   Current Outpatient Prescriptions  Medication Sig Dispense Refill  . aspirin 81 MG chewable tablet Chew 81 mg by mouth daily.    . bisoprolol (ZEBETA) 5 MG tablet Take 0.5 tablets (2.5 mg total) by mouth daily. 15 tablet 6  . digoxin (LANOXIN) 0.125 MG tablet TAKE 1 TABLET BY MOUTH ONCE DAILY 90 tablet 3  . eplerenone (INSPRA) 50 MG tablet Take 25 mg by mouth daily.    . famotidine (PEPCID) 20 MG tablet Take 20 mg by mouth daily as needed for heartburn or indigestion.    . Magnesium 100 MG CAPS Take 1 capsule by mouth 3 (three) times daily.     . Multiple Vitamins-Minerals (MULTIVITAMIN WITH MINERALS) tablet Take 1 tablet by mouth daily.    . nitroGLYCERIN (NITROSTAT) 0.4 MG SL tablet  Place 1 tablet (0.4 mg total) under the tongue every 5 (five) minutes as needed for chest pain. 25 tablet 3  . Omega-3 Fatty Acids (THE VERY FINEST FISH OIL) LIQD Take 2-3 g by mouth daily.     . potassium chloride (K-DUR,KLOR-CON) 10 MEQ tablet Take 10 mEq by mouth 2 (two) times a week.   3  . tadalafil, PAH, (ADCIRCA) 20 MG tablet Take 2 tablets (40 mg total) by mouth daily. 60 tablet 11  . torsemide (DEMADEX) 20 MG tablet Take 4 tablets (80 mg total) by mouth daily. 120 tablet 3  . valsartan (DIOVAN) 40 MG tablet TAKE 1 TABLET BY MOUTH EVERY MORNING AND 2 TABLETS EVERY EVENING 90 tablet 3  . Zinc 100 MG TABS Take 1 tablet by mouth daily.      No current facility-administered medications for this encounter.    BP (!) 122/50 (BP Location: Left Arm, Patient Position: Sitting, Cuff Size: Normal)   Pulse (!) 56   Wt 156 lb 12.8 oz (71.1 kg)   SpO2 96%   BMI 22.50 kg/m    Wt Readings from Last 3 Encounters:  02/10/16 156 lb (70.8 kg)  02/10/16 156 lb 12.8 oz (71.1 kg)  01/28/16 157 lb 12 oz (71.6 kg)     General: NAD Neck: No JVD, no thyromegaly or thyroid nodule.  Lungs: Clear to auscultation bilaterally with normal respiratory effort. CV: Lateral PMI.  Heart irregular S1/S2, no S3/S4, 2/6 HSM LLSB.  1+ left ankle edema, no right ankle edema.  No carotid bruit.  Normal pedal pulses.  Abdomen: Soft, NT, ND, no HSM. No bruits or masses. +BS.  Small ventral hernia, reducible.  Neurologic: Alert and oriented x 3.  Psych: Normal affect. Extremities: No clubbing or cyanosis.  HEENT: Normal.   Assessment/Plan: 1. CAD: S/p LIMA-LAD.  He is on ASA 81.  He has decided not to take statins after reviewing the data (I did recommend taking a statin but we have agreed to disagree on this).  Lexiscan Cardiolite was done in 9/17 due to atypical chest pain.  This showed prior infarction with no ischemia.  No recurrence of chest pain since that time despite ongoing exercise.   2. S/p mitral valve  repair: The MV repair looked stable on 9/17 echo with no evidence for significant mitral stenosis.  3. Aortic insufficiency: Moderate on 9/17 echo.  Continue to follow.  4. Chronic systolic  CHF: Ischemic cardiomyopathy, EF 20-25% on 9/17 echo.  PA pressure remains elevated by doppler measurement but RV systolic function was normal.  Mildly decreased functional capacity on 4/16 CPX, this was mildly improved on 4/17 CPX.  NYHA class II. He does not appear volume overloaded today.   - Continue torsemide 80 mg daily, check BMET today.  - Continue digoxin, check level today.  - Continue bisoprolol 2.5 mg daily (unable to tolerate increase).   - Continue valsartan 40 qam/80 qpm.  He did not tolerate Entresto or further uptitration of valsartan.    - Continue eplerenone 25 (unable to tolerate increase).        - He has refused ICD in the past.  - In the future, he could be an LVAD candidate if he worsens.   5. Atrial fibrillation: s/p Maze in 10/14, then ablation in 3/15.  Prior to Maze, he was on Tikosyn but had breakthrough.  Currently, he is not anticoagulated due to history of prostate bleeding and his choice.  He did have his LA appendage clipped at time of MV surgery.  He has felt more atrial fibrillation/flutter recently, corroborated by event monitor.  He is symptomatic with the arrhythmia (feels considerably more tired).  Today he is in NSR.  - Continue bisoprolol.  - We had a discussions about what to do with his atrial fibrillation.  He is symptomatic when in atrial fibrillation/fluttter.  He had breakthrough on Tikosyn in the past, but has had Maze and atrial fibrillation ablation since that time.  I suggested anticoagulation with Eliquis and re-attempting Tikosyn (cannot tell him definitively that he would be protected enough by the LA appendage clipping alone).  He thinks that he may want to do this but is not sure. He is going to see [name] this afternoon.  He will let me know if he wants to  start Eliquis and be admitted for Tikosyn.  6. Pulmonary hypertension: Severe by most recent echo in 9/17 and on RHC in 10/17.  RV actually looked ok on 9/17 echo.  Mixed pulmonary venous and pulmonary arterial HTN on RHC.  It is possible that the [facility] component is due to pulmonary vascular remodeling in the setting of chronic mitral regurgitation prior to MV repair. Negative V/Q scan, no evidence for CTEPH.  PFTs normal in 9/14.  He is seeing Dr Lake Bells.  Sleep study showed moderate OSA but he has been unable to tolerated CPAP (using oral device).   - I increased Adcirca to 40 mg daily after RHC.   - Given advantage of combination therapy, we will need to address adding an ERA in the future.  7. OSA: Moderate.  Has not tolerated CPAP. Probably plays a role in recurrent atrial fibrillation.  He now uses and mouthpiece at night and feels like this helps.  8. Carotid stenosis: Stable carotid dopplers, repeat in 4/18.    He has an appt in 12/17.  Loralie Champagne 02/10/2016

## 2016-02-10 NOTE — Progress Notes (Signed)
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 He has a history of ischemic heart disease dating back to 2004 when he presented with an anterior wall MI. He had progressive mitral regurgitation and a dilated aortic root and he went to [place] underwent mitral valve repair aortic root surgery and a CryoMaze He also had a single vessel bypass with a LIMA to his LAD Echocardiogram 1/15 it demonstrated ejection fraction 20-25% with moderate AR mild MR with a prosthestic ring.    Subsequently, atrial fibrillation returned and he had an atrial fibrillation ablation in [facility] in 3/15. He wore a Zio patch in 12/15 and had a low atrial fibrillation burden of 7%. He has had a long-standing ischemic cardiomyopathy. In 2015, EF was 20-25%. Echo in 2016 also showed EF 25-30% but estimated PA pressure suggested severe pulmonary hypertension, which is new for him.    He has been following up in the heart failure clinic. Right heart catheterization about a year ago demonstrated moderate pulmonary hypertension and a low cardiac index about 2. Digoxin was started. Entresto was not tolerated. CPAP was not tolerated. Echo 1/17 demonstrated some interval improvement in LVEF at 30-35 with again moderate pulmonary hypertension 69 mm with a moderately dilated RV  90/17 Myoview no ischemia 10/17 right heart catheterization severe pulmonary hypertension of mixed variety.  He was seen a couple of weeks ago and found to be recurrent atypical atrial flutter. I last saw him 3/17. He has not been on anticoagulation as he had atrial appendage oversewing.     No significant fever  Past Medical History:  Diagnosis Date  . Allergic rhinitis   . Atrial fibrillation ([place])   . Atypical pneumonia    . CAD (coronary artery disease)   . Cardiomyopathy, ischemic   . Chronic anticoagulation   . Cough   . Dizziness   . GERD (gastroesophageal reflux disease)   . Heart failure, systolic, acute on chronic ([place])   . Hyperlipidemia   . Hypertension   . OSA (obstructive sleep apnea)    Home sleep test 07/05/2009 AHI 8.2  . Pleural effusion   . Positional vertigo   . TIA (transient ischemic attack)     Past Surgical History:  Procedure Laterality Date  . AORTIC VALVE REPAIR  01/09/13  . CARDIAC CATHETERIZATION N/A 01/10/2016   Procedure: Right Heart Cath;  Surgeon: Larey Dresser, MD;  Location: [facility] CV LAB;  Service: Cardiovascular;  Laterality: N/A;  . CORONARY ARTERY BYPASS GRAFT  01/09/13   LAD LIMA, left atrial appendage  . CORONARY STENT PLACEMENT  2004   LAD  . MITRAL VALVE ANNULOPLASTY  01/09/13  . RIGHT HEART CATHETERIZATION N/A 05/11/2014   Procedure: RIGHT HEART CATH;  Surgeon: Larey Dresser, MD;  Location: [facility] CATH LAB;  Service: Cardiovascular;  Laterality: N/A;  . SHOULDER SURGERY    . TEE WITHOUT CARDIOVERSION N/A 09/21/2012   Procedure: TRANSESOPHAGEAL ECHOCARDIOGRAM (TEE);  Surgeon: Larey Dresser, MD;  Location: [facility] ENDOSCOPY;  Service: Cardiovascular;  Laterality: N/A;    Current Outpatient Prescriptions  Medication Sig Dispense Refill  . aspirin 81 MG chewable tablet Chew 81 mg by mouth daily.    . bisoprolol (ZEBETA) 5 MG tablet Take 0.5 tablets (2.5 mg  total) by mouth daily. 15 tablet 6  . digoxin (LANOXIN) 0.125 MG tablet TAKE 1 TABLET BY MOUTH ONCE DAILY 90 tablet 3  . eplerenone (INSPRA) 50 MG tablet Take 25 mg by mouth daily.    . famotidine (PEPCID) 20 MG tablet Take 20 mg by mouth daily as needed for heartburn or indigestion.    . Magnesium 100 MG CAPS Take 1 capsule by mouth 3 (three) times daily.     . Multiple Vitamins-Minerals (MULTIVITAMIN WITH MINERALS) tablet Take 1 tablet by mouth daily.    . nitroGLYCERIN (NITROSTAT) 0.4 MG SL tablet  Place 1 tablet (0.4 mg total) under the tongue every 5 (five) minutes as needed for chest pain. 25 tablet 3  . Omega-3 Fatty Acids (THE VERY FINEST FISH OIL) LIQD Take 2-3 g by mouth daily.     . potassium chloride (K-DUR,KLOR-CON) 10 MEQ tablet Take 10 mEq by mouth 2 (two) times a week.   3  . tadalafil, PAH, (ADCIRCA) 20 MG tablet Take 2 tablets (40 mg total) by mouth daily. 60 tablet 11  . torsemide (DEMADEX) 20 MG tablet Take 4 tablets (80 mg total) by mouth daily. 120 tablet 3  . valsartan (DIOVAN) 40 MG tablet TAKE 1 TABLET BY MOUTH EVERY MORNING AND 2 TABLETS EVERY EVENING 90 tablet 3  . Zinc 100 MG TABS Take 1 tablet by mouth daily.      No current facility-administered medications for this visit.     Allergies  Allergen Reactions  . Carvedilol Other (See Comments)    Dizziness, uneasiness, alopecia  . Dabigatran Rash  . Pradaxa [Dabigatran Etexilate Mesylate] Rash  . Sulfonamide Derivatives Rash    REACTION: rash  . Xarelto [Rivaroxaban] Rash    Review of Systems negative except from HPI and PMH  Physical Exam BP (!) 126/58   Pulse (!) 55   Ht _0  (1.778 m)   Wt 156 lb (70.8 kg)   SpO2 98%   BMI 22.38 kg/m  Well developed and well nourished in no acute distress HENT normal E scleral and icterus clear Neck Supple JVP flat; carotids brisk and full Clear to ausculation  Regular rate and rhythm, no murmurs gallops or rub Soft with active bowel sounds No clubbing cyanosis left 1+ right trace edema Edema Alert and oriented, grossly normal motor and sensory function Skin Warm and Dry   ECG demonstrated from this morning was reviewed personally and demonstrated NSR  Event recorder was also reviewed. It demonstrates recurrent episodes of atrial tachyarrhythmia. Atrial amplitudes are 2 low to detect specific atrial activity. There is one episode with a pause of 3.3 seconds. No associated symptoms. Symptoms of lightheadedness were associated with irregularity probably  atrial fibrillation/flutter and PVCs   Assessment and  Plan  Atrial flutter/fibrillation  MV repair Cryo Maze  RFCA   Chronic systolic heart failure   Ischemic/nonischemic cardiomyopathy with prior bypass  Atrial oversewing  The patient has recurrent atrial arrhythmias. He has continued to explore options and has a plan to go to [place] to discuss treatment. I have encouraged him to try to be decisive regarding the use of Tikosyn and or ranolazine and the treatment of his atrial arrhythmia as indecision contributes to worsening of his atriopathy  The issue of anticoagulation is I think impossible to assess currently accurately. The atrial oversewing clearly reduces risk. His bleeding and generalized malaise R untowards which he would like to avoid and his thromboembolic risk remains high. We remain available to help  in whatever plan he [name] and the doctors at [facility] to side. As relates to his pause, coming in the context of atrial fibrillation it is too slow I think at this juncture to justify intervention.   We spent more than 50% of our >25 min visit in face to face counseling regarding the above

## 2016-02-10 NOTE — Patient Instructions (Signed)
Medication Instructions: - Your physician recommends that you continue on your current medications as directed. Please refer to the Current Medication list given to you today.  Labwork: - none ordered  Procedures/Testing: - none ordered  Follow-Up: - [name] will see you back on an as needed basis.  Any Additional Special Instructions Will Be Listed Below (If Applicable).     If you need a refill on your cardiac medications before your next appointment, please call your pharmacy.

## 2016-02-10 NOTE — Patient Instructions (Signed)
Routine lab work today. Will notify you of abnormal results, otherwise no news is good news!  Follow up as scheduled.  Do the following things EVERYDAY: 1) Weigh yourself in the morning before breakfast. Write it down and keep it in a log. 2) Take your medicines as prescribed 3) Eat low salt foods-Limit salt (sodium) to 2000 mg per day.  4) Stay as active as you can everyday 5) Limit all fluids for the day to less than 2 liters

## 2016-02-14 ENCOUNTER — Telehealth: Payer: Self-pay | Admitting: Physician Assistant

## 2016-02-14 MED FILL — BISOPROLOL FUMARATE 5 MG TA: 5 | 30 days supply | Qty: 15 | Fill #1

## 2016-02-14 MED FILL — EPLERENONE 25 MG TABLET: 25 | 30 days supply | Qty: 30 | Fill #1

## 2016-02-14 MED FILL — ADCIRCA 20 MG TABLET: 20 | 30 days supply | Qty: 60 | Fill #1

## 2016-02-14 NOTE — Telephone Encounter (Signed)
     [name] called because torsemide 36m daily is not working well. He was recently switched from lasix 872min AM and 4060mn PM to Torsemide 37m7mily given reduced UOP with lasix. He discussed this with Dr. McLeAundra Dubinthe office last week and the plan was to possibly go back to Lasix since this seemed to work better. He has been noticing some worsening SOB and LE edema. His son, who was also on the phone wondered if there were any diuretic alternatives such as Bumex. I told him that I don't see this used as often as lasix or torsemide. We could also consider adding Metolazone to his regimen, but I will leave this to Dr. McLeAundra Dubinr now the plan will be to go up to Lasix 80 mg BID. I will keep Kdur at 10mg57mly since he is on eplerenone and Diovan and his potassium runs in high 3s / low 4s. He may need a BMET next week. I will forward to Dr. McLeaAundra Dubinhe will [facility] on Monday.   KathrAngelena Form  [facility]

## 2016-02-16 NOTE — Telephone Encounter (Signed)
Use Lasix 80 mg bid.  Follow weight.

## 2016-02-26 MED FILL — DIGOXIN 125 MCG TABLET: 125 | 90 days supply | Qty: 90 | Fill #3

## 2016-02-27 ENCOUNTER — Ambulatory Visit (INDEPENDENT_AMBULATORY_CARE_PROVIDER_SITE_OTHER): Payer: Medicare Other | Admitting: Family Medicine

## 2016-02-27 ENCOUNTER — Other Ambulatory Visit: Payer: Self-pay

## 2016-02-27 ENCOUNTER — Encounter: Payer: Self-pay | Admitting: Family Medicine

## 2016-02-27 DIAGNOSIS — I10 Essential (primary) hypertension: Secondary | ICD-10-CM

## 2016-02-27 DIAGNOSIS — I255 Ischemic cardiomyopathy: Secondary | ICD-10-CM | POA: Diagnosis not present

## 2016-02-27 DIAGNOSIS — I5022 Chronic systolic (congestive) heart failure: Secondary | ICD-10-CM

## 2016-02-27 DIAGNOSIS — I2510 Atherosclerotic heart disease of native coronary artery without angina pectoris: Secondary | ICD-10-CM | POA: Diagnosis not present

## 2016-02-27 NOTE — Progress Notes (Signed)
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 No chest pain.   Past Medical History-  Patient Active Problem List   Diagnosis Date Noted  . Pulmonary hypertension 05/24/2014    Priority: High  . S/P CABG x 1 02/09/2013    Priority: High  . Cardiomyopathy ([place]) 01/06/2013    Priority: High  . Chronic systolic heart failure ([place]) 09/11/2010    Priority: High  . Coronary atherosclerosis 03/07/2008    Priority: High  . ATRIAL FIBRILLATION 02/14/2007    Priority: High  . S/P aortic valve repair 02/09/2013    Priority: Medium  . S/P MVR (mitral valve repair) 02/09/2013    Priority: Medium  . OSA (obstructive sleep apnea) 07/13/2012    Priority: Medium  . PSA, INCREASED 12/17/2008    Priority: Medium  . SINUS BRADYCARDIA 08/15/2008    Priority: Medium  . Hyperlipidemia 03/07/2008    Priority: Medium  . Essential hypertension 03/07/2008    Priority: Medium  . Intractable hiccups 06/10/2015    Priority: Low  . Gross hematuria 02/07/2014    Priority: Low  . Age-related macular degeneration, dry 08/06/2013    Priority: Low  . H/O shoulder surgery 01/06/2013    Priority: Low  . Dyspnea on exertion 11/25/2012    Priority: Low  . Anxiety 01/26/2011    Priority: Low  . POSITIONAL VERTIGO 07/13/2008    Priority: Low  . GERD 03/11/2008    Priority: Low  . ALLERGIC RHINITIS 03/07/2008    Priority: Low    Medications- reviewed and updated Current Outpatient Prescriptions  Medication Sig Dispense Refill  . aspirin 81 MG chewable tablet Chew 81 mg by mouth daily.    . bisoprolol (ZEBETA) 5 MG tablet Take 0.5 tablets (2.5 mg total) by mouth daily. 15 tablet 6  . digoxin (LANOXIN) 0.125 MG tablet TAKE 1 TABLET BY MOUTH ONCE DAILY 90 tablet 3  . eplerenone (INSPRA) 50 MG tablet Take 25 mg by mouth daily.    . famotidine (PEPCID) 20  MG tablet Take 20 mg by mouth daily as needed for heartburn or indigestion.    . furosemide (LASIX) 20 MG tablet Take 80 mg by mouth.    . Magnesium 100 MG CAPS Take 1 capsule by mouth 3 (three) times daily.     . Multiple Vitamins-Minerals (MULTIVITAMIN WITH MINERALS) tablet Take 1 tablet by mouth daily.    . Omega-3 Fatty Acids (THE VERY FINEST FISH OIL) LIQD Take 2-3 g by mouth daily.     . potassium chloride (K-DUR,KLOR-CON) 10 MEQ tablet Take 10 mEq by mouth 2 (two) times a week.   3  . tadalafil, PAH, (ADCIRCA) 20 MG tablet Take 2 tablets (40 mg total) by mouth daily. 60 tablet 11  . valsartan (DIOVAN) 40 MG tablet TAKE 1 TABLET BY MOUTH EVERY MORNING AND 2 TABLETS EVERY EVENING 90 tablet 3  . Zinc 100 MG TABS Take 1 tablet by mouth daily.     . nitroGLYCERIN (NITROSTAT) 0.4 MG SL tablet Place 1 tablet (0.4 mg total) under the tongue every 5 (five) minutes as needed for chest pain. 25 tablet 3   No current facility-administered medications for this visit.     Objective: BP 136/68   Pulse (!) 55   Temp 97.6 F (36.4 C) (Oral)   Ht _0  (1.753 m)   Wt  158 lb 3.2 oz (71.8 kg)   SpO2 97%   BMI 23.36 kg/m  Gen: NAD, resting comfortably CV: RRR slight murmur in LLSB Lungs: CTAB no crackles, wheeze, rhonchi Ext: 1+ on left, trace on right  Skin: warm, dry, no rash  Assessment/Plan:  Coronary atherosclerosis S:patient with known CAD with history of stent. He is compliant with aspirin but he is aware of recommendations for statin which he declines. No chest pain but does have some shortness of breath related to fluid status.  A/P: has regular cardiology follow up- he disagrees with literature on statin use though agrees with aspirin use.   Chronic systolic heart failure (HCC) S: patient states feels best with weight about 153. Has crept up to 158 slowly. Having orthopnea and increased edema in legs. He is taking lasix 61m a day (usually 40, 20, 20 at least). Had been on torsemide  884ma day total. Took 40 this AM then another 2071mefore coming today- less diuresis with the 105m46mP: discussed taking another 40mg59mix this evening- has cardiology follow up tomorrow fortunately and likely continue more aggressive diuresis- discussed fluid gains could havebeen related from transition of torsemide to lasix at similar doses   Essential [facility] mild poor control BP Readings from Last 3 Encounters:  02/27/16 136/68  02/10/16 (!) 122/50  02/10/16 (!) 126/58  A/P:Continue current meds:  Discussed likely related to fluid status and likely to improve as we diurese him- only slight adjutsment today then see cardiology tomorrow   A lot going on from cardiac perspective- wants to follow up with me prn- agreed to this.   Meds ordered this encounter  Medications  . furosemide (LASIX) 20 MG tablet    Sig: Take 80 mg by mouth.    Return precautions advised.  StephGarret Reddish

## 2016-02-27 NOTE — Patient Instructions (Signed)
Try 20m with next dose of lasix, would do that again tomorrow morning. Glad you are seeing cardiology with fluid increase  Great to meet you   I am here for you as needed  Consider prevnar 13.

## 2016-02-27 NOTE — Assessment & Plan Note (Signed)
S: patient states feels best with weight about 153. Has crept up to 158 slowly. Having orthopnea and increased edema in legs. He is taking lasix 66m a day (usually 40, 20, 20 at least). Had been on torsemide 883ma day total. Took 40 this AM then another 2070mefore coming today- less diuresis with the 28m24mP: discussed taking another 40mg28mix this evening- has cardiology follow up tomorrow fortunately and likely continue more aggressive diuresis- discussed fluid gains could havebeen related from transition of torsemide to lasix at similar doses

## 2016-02-27 NOTE — Assessment & Plan Note (Signed)
S: controlled per [facility] but with new guidelines mild poor control BP Readings from Last 3 Encounters:  02/27/16 136/68  02/10/16 (!) 122/50  02/10/16 (!) 126/58  A/P:Continue current meds:  Discussed likely related to fluid status and likely to improve as we diurese him- only slight adjutsment today then see cardiology tomorrow

## 2016-02-27 NOTE — Assessment & Plan Note (Addendum)
S:patient with known CAD with history of stent. He is compliant with aspirin but he is aware of recommendations for statin which he declines. No chest pain but does have some shortness of breath related to fluid status.  A/P: has regular cardiology follow up- he disagrees with literature on statin use though agrees with aspirin use.

## 2016-02-27 NOTE — Progress Notes (Signed)
Pre visit review using our clinic review tool, if applicable. No additional management support is needed unless otherwise documented below in the visit note. 

## 2016-02-28 ENCOUNTER — Encounter (HOSPITAL_COMMUNITY): Payer: Self-pay

## 2016-02-28 ENCOUNTER — Ambulatory Visit (HOSPITAL_COMMUNITY)
Admission: RE | Admit: 2016-02-28 | Discharge: 2016-02-28 | Disposition: A | Discharge status: Home / Self Care | Payer: Medicare Other | Source: Ambulatory Visit | Attending: Cardiology | Admitting: Cardiology

## 2016-02-28 VITALS — BP 120/58 | HR 58 | Wt 157.0 lb

## 2016-02-28 DIAGNOSIS — I6523 Occlusion and stenosis of bilateral carotid arteries: Secondary | ICD-10-CM | POA: Diagnosis not present

## 2016-02-28 DIAGNOSIS — G4733 Obstructive sleep apnea (adult) (pediatric): Secondary | ICD-10-CM | POA: Diagnosis not present

## 2016-02-28 DIAGNOSIS — Z8673 Personal history of transient ischemic attack (TIA), and cerebral infarction without residual deficits: Secondary | ICD-10-CM | POA: Insufficient documentation

## 2016-02-28 DIAGNOSIS — K219 Gastro-esophageal reflux disease without esophagitis: Secondary | ICD-10-CM | POA: Diagnosis not present

## 2016-02-28 DIAGNOSIS — I872 Venous insufficiency (chronic) (peripheral): Secondary | ICD-10-CM | POA: Diagnosis not present

## 2016-02-28 DIAGNOSIS — I255 Ischemic cardiomyopathy: Secondary | ICD-10-CM | POA: Diagnosis not present

## 2016-02-28 DIAGNOSIS — Z951 Presence of aortocoronary bypass graft: Secondary | ICD-10-CM | POA: Insufficient documentation

## 2016-02-28 DIAGNOSIS — I272 Pulmonary hypertension, unspecified: Secondary | ICD-10-CM | POA: Diagnosis not present

## 2016-02-28 DIAGNOSIS — I340 Nonrheumatic mitral (valve) insufficiency: Secondary | ICD-10-CM | POA: Diagnosis not present

## 2016-02-28 DIAGNOSIS — Z8249 Family history of ischemic heart disease and other diseases of the circulatory system: Secondary | ICD-10-CM | POA: Diagnosis not present

## 2016-02-28 DIAGNOSIS — G629 Polyneuropathy, unspecified: Secondary | ICD-10-CM | POA: Insufficient documentation

## 2016-02-28 DIAGNOSIS — I2721 Secondary pulmonary arterial hypertension: Secondary | ICD-10-CM | POA: Insufficient documentation

## 2016-02-28 DIAGNOSIS — Z79899 Other long term (current) drug therapy: Secondary | ICD-10-CM | POA: Diagnosis not present

## 2016-02-28 DIAGNOSIS — E785 Hyperlipidemia, unspecified: Secondary | ICD-10-CM | POA: Insufficient documentation

## 2016-02-28 DIAGNOSIS — I2510 Atherosclerotic heart disease of native coronary artery without angina pectoris: Secondary | ICD-10-CM | POA: Diagnosis not present

## 2016-02-28 DIAGNOSIS — I5022 Chronic systolic (congestive) heart failure: Secondary | ICD-10-CM | POA: Insufficient documentation

## 2016-02-28 DIAGNOSIS — Z9889 Other specified postprocedural states: Secondary | ICD-10-CM | POA: Diagnosis not present

## 2016-02-28 DIAGNOSIS — I252 Old myocardial infarction: Secondary | ICD-10-CM | POA: Diagnosis not present

## 2016-02-28 DIAGNOSIS — I480 Paroxysmal atrial fibrillation: Secondary | ICD-10-CM | POA: Diagnosis not present

## 2016-02-28 DIAGNOSIS — Z7982 Long term (current) use of aspirin: Secondary | ICD-10-CM | POA: Diagnosis not present

## 2016-02-28 LAB — BASIC METABOLIC PANEL
ANION GAP: 11 (ref 5–15)
BUN: 17 mg/dL (ref 6–20)
CALCIUM: 9.5 mg/dL (ref 8.9–10.3)
CO2: 27 mmol/L (ref 22–32)
Chloride: 98 mmol/L — ABNORMAL LOW (ref 101–111)
Creatinine, Ser: 1.02 mg/dL (ref 0.61–1.24)
GFR calc Af Amer: >60 mL/min (ref >60)
GLUCOSE: 95 mg/dL (ref 65–99)
Potassium: 3.7 mmol/L (ref 3.5–5.1)
Sodium: 136 mmol/L (ref 135–145)

## 2016-02-28 LAB — DIGOXIN LEVEL: Digoxin Level: 0.7 ng/mL — ABNORMAL LOW (ref 0.8–2.0)

## 2016-02-28 LAB — BRAIN NATRIURETIC PEPTIDE: B Natriuretic Peptide: 293.8 pg/mL — ABNORMAL HIGH (ref 0.0–100.0)

## 2016-02-28 MED ORDER — POTASSIUM CHLORIDE CRYS ER 10 MEQ PO TBCR: 20.0000 meq | EXTENDED_RELEASE_TABLET | Freq: Every day | ORAL | 3 refills | Status: DC

## 2016-02-28 MED ORDER — FUROSEMIDE 20 MG PO TABS: 60.0000 mg | ORAL_TABLET | Freq: Two times a day (BID) | ORAL | 3 refills | Status: DC

## 2016-02-28 MED FILL — FUROSEMIDE 20 MG TABLET: 20 | 30 days supply | Qty: 180 | Fill #0

## 2016-02-28 MED FILL — POTASSIUM CL 10 MEQ TAB SA: 10 | 30 days supply | Qty: 60 | Fill #0

## 2016-02-28 NOTE — Patient Instructions (Addendum)
Labs today (will call for abnormal results, otherwise no news is good news)  Labs in 10 days  Increase Furosamide to 60 mg (3 Tabs) 2 Times Daily  Increase Potassium 20 mEq (2 Tabs) Daily  Follow up in 3 Months

## 2016-02-29 NOTE — Progress Notes (Signed)
Patient ID: Caleb Moment, PhD, male   DOB: 05/01/1940, 75 y.o.   MRN: [id number]    Advanced Heart Failure Clinic Note   PCP: [name] EP: [name] Cardiology: [name]  75 yo with complex past history presents for heart failure evaluation.  Patient had anterior MI in 2004 and developed ischemic cardiomyopathy as well as mitral regurgitation. He also developed atrial fibrillation.  In 10/14, he had MV repair, Maze, CABG with LIMA-LAD, and LA appendage closure at [facility] in [place].  Subsequently, atrial fibrillation returned and he had an atrial fibrillation ablation in [facility] in 3/15.  He wore a Zio patch in 12/15 and had a low atrial fibrillation burden of 7%.  He has had a long-standing ischemic cardiomyopathy.  In 2015, EF was 20-25%.  Echo in 2016 also showed EF 25-30% but estimated PA pressure suggested severe pulmonary hypertension, which is new for him.    At initial appointment, he reported increased exertional dyspnea over a number of weeks.  RHC was done, showing mildly elevated PCWP with moderate pulmonary HTN and low cardiac index (1.93 thermo, 2.13 Fick).  V/Q scan showed no PE.  I started him on digoxin and have titrated his Lasix to 80 mg daily.  He is now on bisoprolol and able to tolerate it.  At last appointment, I had him try replacing valsartan with Entresto 24/26 bid.  He was unable to tolerate it (made him dizzy, though BP was not low).  Therefore, I had him restart valsartan.  CPX in 4/16 showed mildly decreased functional capacity.  He has tolerated Adcirca 20 mg daily.  He did not tolerate an attempt to uptitrate bisoprolol.  He tried CPAP but was unable to tolerate it.  He was unable to tolerate eplerenone 50 mg daily.  Last echo in 9/17 showed EF 20-25%, stable MV repair, normal RV, and PA systolic pressure 86 mmHg.   He had a Lexiscan Cardiolite in 9/17 that showed infarction, no ischemia.  Henry in 10/17 showed severe mixed pulmonary venous HTN/pulmonary  arterial HTN with PVR 4.3 WU and PCWP mildly elevated.  Adcirca was increased to 40 mg daily and Lasix was increased to 80 qam/40 qpm.  I switched him over to torsemide because of increased weight.  However, he started back on Lasix as he did not think torsemide was as effective.   At last appointment, he was having more palpitations.  Event monitor in 11/17 showed atrial fibrillation episodes, these are symptomatic.  Since then, the atrial fibrillation seems to have decreased.  He is in NSR today.  He saw [name] to discuss Tikosyn initiation.  No decision was reached at that time.   He seems symptomatically stable today. Weight is stable.  He has varying exertional dyspnea.  Some days, he can walk as far as he wants.  On other days, he is dyspneic after 100 yards.  He sleeps on 2 pillows. No chest pain.  No PND.  He is no longer using his oral device for OSA.   ECG: NSR, 1st degree AVB, IVCD 128 msec, anterolateral/lateral TWIs.   6 minute walk (3/16): 381 m  6 minute walk (5/16): 414.5 m 6 minute walk (10/16): 562 m 6 minute walk (1/17): 469 m 6 minute walk (8/17): 488 m  Labs (2/15): LDL 144 Labs (8/15): K 4.6, creatinine 0.9 Labs (12/15): HCT 42.3   Labs (2/16): K 4 => 4.2, creatinine 1.05 => 0.92, BNP 268 Labs (3/16): BNP 495 =>  320, digoxin 0.4, RF 14.7 (very mild increase), TSH normal, HIV negative, anti-SCL70 negative, creatinine 0.91, K 4.4 Labs (7/16): K 5, creatinine 1.05, BNP 455, vitamin D normal, digoxin 0.4, B12 normal Labs (8/16): digoxin 0.8, BNP 305, K 4.2, creatinine 0.99 Labs (9/16): HCT 43.3, TSH normal, BNP 492 => 278, K 4.3, creatinine 0.97 => 1.04, digoxin 0.6, TSH normal, LDL 154, LDL-P 1654.  Labs (10/16): K 4.7, creatinine 1.1 Labs (1/17): pro-BNP 2065, digoxin 0.3, K 4.3, creatinine 1.10 => 1.28 Labs (3/17): K 4.1, creatinine 1.09, HCT 38.3 Labs (4/17): K 4.4, creatinine 0.99, digoxin 0.8 Labs (5/17): K 4.3, creatinine 1.08, BNP 317, hgb 13.1 Labs (8/17): K  4.6, creatinine 0.99, BNP 392, digoxin 0.5 Labs (9/17): K 4.4, creatinine 1.05 Labs (10/17): digoxin 0.5, K 4.1, creatinine 1.12, HCT 44.9, digoxin 0.5 Labs (11/17): K 4 => 3.8, creatinine 1.3 => 1.16, digoxin 0.5, BNP 296  PMH: 1. CAD: Anterior MI in 2004.  Cardiac surgery in 10/14 included LIMA-LAD.  - Lexiscan Cardiolite (9/17): EF 35%, infarct present with no ischemia.  2. Chronic mitral regurgitation: 10/14 surgery at [facility] with MV repair, Maze, LIMA-LAD, and LA appendage closure.  3. Atrial fibrillation: Paroxysmal.  He was initially on Tikosyn but had breakthrough atrial fibrillation.  H/o Maze in 2014.  Had recurrent atrial fibrillation with ablation in 3/15 by Dr Ola Spurr in [facility].  Not anticoagulated after 2 severe prostate bleeding episodes.  LA appendage was clipped with MV surgery.  - Zio patch in 12/15 with low atrial fibrillation burden (7%).  - Holter (9/16) with PACs, PVCs, short atrial fibrillation runs (nothing sustained). - Event monitor (11/17) with runs of atrial fibrillation and flutter.   4. Chronotropic incompetence.  5. HTN 6. Hyperlipidemia: Refuses statin.  7. Peripheral neuropathy 8. GERD 9. H/o BPPV 10. H/o TIA 11. Ischemic cardiomyopathy: cardiac MRI 7/14 with EF 35%, moderate MR, normal RV size and systolic function, extensive anterior and anteroseptal LGE suggestive of non-viable myocardium (this was prior to LIMA-LAD).  Echo 1/15 with EF 20-25%, moderate AI, PA systolic pressure 39 mmHg. Echo (1/16) with EF 20-25%, diffuse hypokinesis with regionality, moderate LV dilation, moderate AI, s/p MV repair with mild MR and normal gradients, RV dilated with mildly decreased systolic function, PA systolic pressure 71 mmHg.   - RHC (2/16) with mean RA 9, PA 61/25 mean 40, mean PCWP 23, CI 2.13/PVR 4.3 (Fick), CI 1.93/PVR 4.7 (thermo).   - CPX (4/16) with peak VO2 17.9, VE/VCO2 34.7 => mildly decreased functional capacity.  - Spironolactone apparently  caused cognitive deficits - Intolerant of Coreg due to development of severe alopecia - Unable to uptitrate bisoprolol due to intolerance.  - Lightheaded with Entresto.  - Unable to tolerate increase in valsartan to 80 mg bid.  - Echo (1/17) with EF 30-35%, moderate LV dilation, mild AI, s/p MV repair with mild MR, moderately dilated RV with mildly decreased systolic function, PA systolic pressure 69 mmHg.  - CPX (4/17): peak VO2 18, VE/VCO2 slope 33, RER 1.28 => mild to moderate functional impairment, mildly improved.  - Echo (9/17): EF 20-25% with regional WMAs, normal RV size and systolic function, PASP 86 mmHg, stable repaired mitral valve with mild MR, moderate AI.  - RHC (10/17): mean RA 9, PA 70/23 mean 43, PCWP mean 20, CI 2.96 Fick/2.84 Thermo, PVR 4.3 WU.  12. Carotid stenosis: Carotid dopplers (1/16) with 40-59% bilateral ICA stenosis. Carotid dopplers (4/17) with 40-59% BICA stenosis.  13. Aortic insufficiency: Moderate by last  echo 9/17.  14. Pulmonary HTN: Mixed PAH and pulmonary venous hypertension.  PFTs (9/14) were normal.  V/Q scan (2/16) with no evidence of acute or chronic PE.  6 minute walk (3/16) 381 m. 6 minute walk (5/16) 414.5 m. 6 minute walk (1/17) 469 m.  15. OSA: Moderate on 5/16 sleep study. Unable to tolerate CPAP.  16. Venous insufficiency 17. Ventral hernia  SH: Married, lives in [place], Engineer, water, nonsmoker  FH: CAD  ROS: All systems reviewed and negative except as per HPI.   Current Outpatient Prescriptions  Medication Sig Dispense Refill  . aspirin 81 MG chewable tablet Chew 81 mg by mouth daily.    . bisoprolol (ZEBETA) 5 MG tablet Take 0.5 tablets (2.5 mg total) by mouth daily. 15 tablet 6  . digoxin (LANOXIN) 0.125 MG tablet TAKE 1 TABLET BY MOUTH ONCE DAILY 90 tablet 3  . eplerenone (INSPRA) 50 MG tablet Take 25 mg by mouth daily.    . famotidine (PEPCID) 20 MG tablet Take 20 mg by mouth daily as needed for heartburn or indigestion.    .  furosemide (LASIX) 20 MG tablet Take 3 tablets (60 mg total) by mouth 2 (two) times daily. 180 tablet 3  . Magnesium 100 MG CAPS Take 1 capsule by mouth 3 (three) times daily.     . Multiple Vitamins-Minerals (MULTIVITAMIN WITH MINERALS) tablet Take 1 tablet by mouth daily.    . Omega-3 Fatty Acids (THE VERY FINEST FISH OIL) LIQD Take 2-3 g by mouth daily.     . potassium chloride (K-DUR,KLOR-CON) 10 MEQ tablet Take 2 tablets (20 mEq total) by mouth daily. 60 tablet 3  . tadalafil, PAH, (ADCIRCA) 20 MG tablet Take 2 tablets (40 mg total) by mouth daily. 60 tablet 11  . valsartan (DIOVAN) 40 MG tablet TAKE 1 TABLET BY MOUTH EVERY MORNING AND 2 TABLETS EVERY EVENING 90 tablet 3  . Zinc 100 MG TABS Take 1 tablet by mouth daily.     . nitroGLYCERIN (NITROSTAT) 0.4 MG SL tablet Place 1 tablet (0.4 mg total) under the tongue every 5 (five) minutes as needed for chest pain. (Patient not taking: Reported on 02/28/2016) 25 tablet 3   No current facility-administered medications for this encounter.    BP (!) 120/58   Pulse (!) 58   Wt 157 lb (71.2 kg)   SpO2 100%   BMI 23.18 kg/m    Wt Readings from Last 3 Encounters:  02/28/16 157 lb (71.2 kg)  02/27/16 158 lb 3.2 oz (71.8 kg)  02/10/16 156 lb 12.8 oz (71.1 kg)     General: NAD Neck: JVP 8 cm, no thyromegaly or thyroid nodule.  Lungs: Clear to auscultation bilaterally with normal respiratory effort. CV: Lateral PMI.  Heart regular S1/S2, no S3/S4, 2/6 HSM LLSB.  1+ edema 1/2 to knees bilaterally.  No carotid bruit.  Normal pedal pulses.  Abdomen: Soft, NT, ND, no HSM. No bruits or masses. +BS.  Small ventral hernia, reducible.  Neurologic: Alert and oriented x 3.  Psych: Normal affect. Extremities: No clubbing or cyanosis.  HEENT: Normal.   Assessment/Plan: 1. CAD: S/p LIMA-LAD.  He is on ASA 81.  He has decided not to take statins after reviewing the data (I did recommend taking a statin but we have agreed to disagree on this).  Lexiscan  Cardiolite was done in 9/17 due to atypical chest pain.  This showed prior infarction with no ischemia.  No recurrence of chest pain since that time despite  ongoing exercise.   2. S/p mitral valve repair: The MV repair looked stable on 9/17 echo with no evidence for significant mitral stenosis.  3. Aortic insufficiency: Moderate on 9/17 echo.  Continue to follow.  4. Chronic systolic CHF: Ischemic cardiomyopathy, EF 20-25% on 9/17 echo.  PA pressure remains elevated by doppler measurement but RV systolic function was normal.  Mildly decreased functional capacity on 4/16 CPX, this was mildly improved on 4/17 CPX.  NYHA class II for the most part. He is mildly volume overloaded on exam and back on Lasix (did not think torsemide was as effective).   - Increase Lasix to 60 mg bid, BMET/BNP today and BMET in 10 days.  - Continue digoxin, check level today.  - Continue bisoprolol 2.5 mg daily (unable to tolerate increase).   - Continue valsartan 40 qam/80 qpm.  He did not tolerate Entresto or further uptitration of valsartan.    - Continue eplerenone 25 (unable to tolerate increase).        - He has refused ICD in the past.  - In the future, he could be an LVAD candidate if he worsens.   5. Atrial fibrillation: s/p Maze in 10/14, then ablation in 3/15.  Prior to Maze, he was on Tikosyn but had breakthrough.  Currently, he is not anticoagulated due to history of prostate bleeding and his choice.  He did have his LA appendage clipped at time of MV surgery.  At prior appointment, he was feeling more atrial fibrillation (confirmed by event monitor).  He is symptomatic with the arrhythmia (feels considerably more tired).  Today he is in NSR.  - Continue bisoprolol.  - We have had discussions about what to do with his atrial fibrillation.  He is symptomatic when in atrial fibrillation/fluttter.  He had breakthrough on Tikosyn in the past, but has had Maze and atrial fibrillation ablation since that time.  I again  suggested anticoagulation with Eliquis and re-attempting Tikosyn (cannot tell him definitively that he would be protected enough by the LA appendage clipping alone).  He talked to Dr Caryl Comes about this also recently.  He is going to be seen out at [facility] in February, I asked him to try to make a decision one way or the other by the time he gets back as rhythm control treatment will become more difficult the more atrial fibrillation he has.   6. Pulmonary hypertension: Severe by most recent echo in 9/17 and on RHC in 10/17.  RV actually looked ok on 9/17 echo.  Mixed pulmonary venous and pulmonary arterial HTN on RHC.  It is possible that the [facility] component is due to pulmonary vascular remodeling in the setting of chronic mitral regurgitation prior to MV repair. Negative V/Q scan, no evidence for CTEPH.  PFTs normal in 9/14.  He is seeing Dr Lake Bells.  Sleep study showed moderate OSA but he has been unable to tolerated CPAP and is now no longer using his oral device due to discomfort.  - I increased Adcirca to 40 mg daily after RHC.   - Given advantage of combination therapy, we will need to address adding an ERA in the future.  7. OSA: Moderate.  Has not tolerated CPAP. Probably plays a role in recurrent atrial fibrillation and pulmonary hypertension.  He has been unable to tolerate his oral device more recently.  8. Carotid stenosis: Stable carotid dopplers, repeat in 4/18.    Followup in 2/18.  Loralie Champagne 02/29/2016

## 2016-03-02 ENCOUNTER — Encounter (HOSPITAL_COMMUNITY): Payer: Medicare Other

## 2016-03-09 ENCOUNTER — Ambulatory Visit (HOSPITAL_COMMUNITY)
Admission: RE | Admit: 2016-03-09 | Discharge: 2016-03-09 | Disposition: A | Discharge status: Home / Self Care | Payer: Medicare Other | Source: Ambulatory Visit | Attending: Cardiology | Admitting: Cardiology

## 2016-03-09 DIAGNOSIS — I5022 Chronic systolic (congestive) heart failure: Secondary | ICD-10-CM

## 2016-03-09 LAB — BASIC METABOLIC PANEL
ANION GAP: 8 (ref 5–15)
BUN: 19 mg/dL (ref 6–20)
CHLORIDE: 97 mmol/L — AB (ref 101–111)
CO2: 31 mmol/L (ref 22–32)
CREATININE: 1.15 mg/dL (ref 0.61–1.24)
Calcium: 9.2 mg/dL (ref 8.9–10.3)
GFR calc non Af Amer: 60 mL/min — ABNORMAL LOW (ref >60)
Glucose, Bld: 112 mg/dL — ABNORMAL HIGH (ref 65–99)
Potassium: 4.1 mmol/L (ref 3.5–5.1)
SODIUM: 136 mmol/L (ref 135–145)

## 2016-03-11 ENCOUNTER — Other Ambulatory Visit (HOSPITAL_COMMUNITY): Payer: Self-pay | Admitting: Cardiology

## 2016-03-11 MED FILL — VALSARTAN 40 MG TABLET: 40 | 30 days supply | Qty: 90 | Fill #0

## 2016-03-17 MED FILL — BISOPROLOL FUMARATE 5 MG TA: 5 | 30 days supply | Qty: 15 | Fill #2

## 2016-03-20 MED FILL — EPLERENONE 25 MG TABLET: 25 | 30 days supply | Qty: 30 | Fill #2

## 2016-03-31 DIAGNOSIS — H2513 Age-related nuclear cataract, bilateral: Secondary | ICD-10-CM | POA: Diagnosis not present

## 2016-03-31 DIAGNOSIS — H353130 Nonexudative age-related macular degeneration, bilateral, stage unspecified: Secondary | ICD-10-CM | POA: Diagnosis not present

## 2016-03-31 DIAGNOSIS — H16141 Punctate keratitis, right eye: Secondary | ICD-10-CM | POA: Diagnosis not present

## 2016-03-31 DIAGNOSIS — H43813 Vitreous degeneration, bilateral: Secondary | ICD-10-CM | POA: Diagnosis not present

## 2016-04-02 ENCOUNTER — Telehealth (HOSPITAL_COMMUNITY): Payer: Self-pay | Admitting: *Deleted

## 2016-04-02 NOTE — Telephone Encounter (Signed)
Pt called to request 2 copies of his last OV note and samples of Adcirca.  All left at front desk for pt.  Medication Samples have been provided to the patient.  Drug name: Adcirca       Strength: 20 mg        Qty: 4  LOT: [id number]  Exp.Date: 2/20  Dosing instructions: 2 tabs daily  The patient has been instructed regarding the correct time, dose, and frequency of taking this medication, including desired effects and most common side effects.   Jermani Eberlein 1:04 PM 04/02/2016

## 2016-04-17 MED FILL — TORSEMIDE 20 MG TABLET: 20 | 30 days supply | Qty: 120 | Fill #1

## 2016-04-17 MED FILL — BISOPROLOL FUMARATE 5 MG TA: 5 | 30 days supply | Qty: 15 | Fill #3

## 2016-04-20 MED FILL — ADCIRCA 20 MG TABLET: 20 | 30 days supply | Qty: 60 | Fill #2

## 2016-04-20 MED FILL — EPLERENONE 25 MG TABLET: 25 | 30 days supply | Qty: 30 | Fill #3

## 2016-04-21 MED FILL — VALSARTAN 40 MG TABLET: 40 | 30 days supply | Qty: 90 | Fill #1

## 2016-04-27 DIAGNOSIS — I371 Nonrheumatic pulmonary valve insufficiency: Secondary | ICD-10-CM | POA: Diagnosis not present

## 2016-04-27 DIAGNOSIS — R011 Cardiac murmur, unspecified: Secondary | ICD-10-CM | POA: Diagnosis not present

## 2016-04-27 DIAGNOSIS — I083 Combined rheumatic disorders of mitral, aortic and tricuspid valves: Secondary | ICD-10-CM | POA: Diagnosis not present

## 2016-04-27 DIAGNOSIS — R0989 Other specified symptoms and signs involving the circulatory and respiratory systems: Secondary | ICD-10-CM | POA: Diagnosis not present

## 2016-04-27 DIAGNOSIS — I429 Cardiomyopathy, unspecified: Secondary | ICD-10-CM | POA: Diagnosis not present

## 2016-04-27 DIAGNOSIS — I6521 Occlusion and stenosis of right carotid artery: Secondary | ICD-10-CM | POA: Diagnosis not present

## 2016-04-27 DIAGNOSIS — I272 Pulmonary hypertension, unspecified: Secondary | ICD-10-CM | POA: Diagnosis not present

## 2016-04-28 DIAGNOSIS — I429 Cardiomyopathy, unspecified: Secondary | ICD-10-CM | POA: Diagnosis not present

## 2016-04-28 DIAGNOSIS — E782 Mixed hyperlipidemia: Secondary | ICD-10-CM | POA: Diagnosis not present

## 2016-04-28 DIAGNOSIS — R5383 Other fatigue: Secondary | ICD-10-CM | POA: Diagnosis not present

## 2016-04-28 DIAGNOSIS — F489 Nonpsychotic mental disorder, unspecified: Secondary | ICD-10-CM | POA: Diagnosis not present

## 2016-04-28 DIAGNOSIS — I10 Essential (primary) hypertension: Secondary | ICD-10-CM | POA: Diagnosis not present

## 2016-04-28 DIAGNOSIS — G4733 Obstructive sleep apnea (adult) (pediatric): Secondary | ICD-10-CM | POA: Diagnosis not present

## 2016-04-28 DIAGNOSIS — I4891 Unspecified atrial fibrillation: Secondary | ICD-10-CM | POA: Diagnosis not present

## 2016-04-28 DIAGNOSIS — R0602 Shortness of breath: Secondary | ICD-10-CM | POA: Diagnosis not present

## 2016-04-28 DIAGNOSIS — Z125 Encounter for screening for malignant neoplasm of prostate: Secondary | ICD-10-CM | POA: Diagnosis not present

## 2016-04-28 DIAGNOSIS — M2550 Pain in unspecified joint: Secondary | ICD-10-CM | POA: Diagnosis not present

## 2016-04-28 DIAGNOSIS — I272 Pulmonary hypertension, unspecified: Secondary | ICD-10-CM | POA: Diagnosis not present

## 2016-04-28 DIAGNOSIS — R0609 Other forms of dyspnea: Secondary | ICD-10-CM | POA: Diagnosis not present

## 2016-05-03 DIAGNOSIS — G4733 Obstructive sleep apnea (adult) (pediatric): Secondary | ICD-10-CM | POA: Diagnosis not present

## 2016-05-20 ENCOUNTER — Other Ambulatory Visit: Payer: Self-pay | Admitting: Internal Medicine

## 2016-05-20 ENCOUNTER — Other Ambulatory Visit (HOSPITAL_COMMUNITY): Payer: Self-pay | Admitting: Cardiology

## 2016-05-20 MED FILL — DIGOXIN 125 MCG TABLET: 125 | 90 days supply | Qty: 90 | Fill #0

## 2016-05-20 MED FILL — BISOPROLOL FUMARATE 5 MG TA: 5 | 90 days supply | Qty: 45 | Fill #0

## 2016-05-20 MED FILL — EPLERENONE 25 MG TABLET: 25 | 30 days supply | Qty: 30 | Fill #4

## 2016-05-28 ENCOUNTER — Ambulatory Visit (HOSPITAL_COMMUNITY)
Admission: RE | Admit: 2016-05-28 | Discharge: 2016-05-28 | Disposition: A | Discharge status: Home / Self Care | Payer: Medicare Other | Source: Ambulatory Visit | Attending: Cardiology | Admitting: Cardiology

## 2016-05-28 ENCOUNTER — Encounter (HOSPITAL_COMMUNITY): Payer: Self-pay

## 2016-05-28 VITALS — BP 138/65 | HR 60 | Wt 159.5 lb

## 2016-05-28 DIAGNOSIS — G629 Polyneuropathy, unspecified: Secondary | ICD-10-CM | POA: Insufficient documentation

## 2016-05-28 DIAGNOSIS — I5022 Chronic systolic (congestive) heart failure: Secondary | ICD-10-CM | POA: Diagnosis not present

## 2016-05-28 DIAGNOSIS — E785 Hyperlipidemia, unspecified: Secondary | ICD-10-CM | POA: Insufficient documentation

## 2016-05-28 DIAGNOSIS — I2721 Secondary pulmonary arterial hypertension: Secondary | ICD-10-CM | POA: Diagnosis not present

## 2016-05-28 DIAGNOSIS — K219 Gastro-esophageal reflux disease without esophagitis: Secondary | ICD-10-CM | POA: Diagnosis not present

## 2016-05-28 DIAGNOSIS — I255 Ischemic cardiomyopathy: Secondary | ICD-10-CM | POA: Diagnosis not present

## 2016-05-28 DIAGNOSIS — R002 Palpitations: Secondary | ICD-10-CM | POA: Insufficient documentation

## 2016-05-28 DIAGNOSIS — G4733 Obstructive sleep apnea (adult) (pediatric): Secondary | ICD-10-CM | POA: Insufficient documentation

## 2016-05-28 DIAGNOSIS — I872 Venous insufficiency (chronic) (peripheral): Secondary | ICD-10-CM | POA: Diagnosis not present

## 2016-05-28 DIAGNOSIS — I480 Paroxysmal atrial fibrillation: Secondary | ICD-10-CM | POA: Insufficient documentation

## 2016-05-28 DIAGNOSIS — I6523 Occlusion and stenosis of bilateral carotid arteries: Secondary | ICD-10-CM | POA: Diagnosis not present

## 2016-05-28 DIAGNOSIS — Z7982 Long term (current) use of aspirin: Secondary | ICD-10-CM | POA: Diagnosis not present

## 2016-05-28 DIAGNOSIS — Z9889 Other specified postprocedural states: Secondary | ICD-10-CM

## 2016-05-28 DIAGNOSIS — I340 Nonrheumatic mitral (valve) insufficiency: Secondary | ICD-10-CM | POA: Diagnosis not present

## 2016-05-28 DIAGNOSIS — I272 Pulmonary hypertension, unspecified: Secondary | ICD-10-CM | POA: Diagnosis not present

## 2016-05-28 DIAGNOSIS — Z8249 Family history of ischemic heart disease and other diseases of the circulatory system: Secondary | ICD-10-CM | POA: Diagnosis not present

## 2016-05-28 DIAGNOSIS — I4891 Unspecified atrial fibrillation: Secondary | ICD-10-CM | POA: Diagnosis not present

## 2016-05-28 DIAGNOSIS — I252 Old myocardial infarction: Secondary | ICD-10-CM | POA: Insufficient documentation

## 2016-05-28 DIAGNOSIS — I2510 Atherosclerotic heart disease of native coronary artery without angina pectoris: Secondary | ICD-10-CM | POA: Diagnosis not present

## 2016-05-28 DIAGNOSIS — Z8673 Personal history of transient ischemic attack (TIA), and cerebral infarction without residual deficits: Secondary | ICD-10-CM | POA: Diagnosis not present

## 2016-05-28 DIAGNOSIS — Z79899 Other long term (current) drug therapy: Secondary | ICD-10-CM | POA: Diagnosis not present

## 2016-05-28 LAB — BASIC METABOLIC PANEL
ANION GAP: 8 (ref 5–15)
BUN: 25 mg/dL — ABNORMAL HIGH (ref 6–20)
CO2: 29 mmol/L (ref 22–32)
Calcium: 9.7 mg/dL (ref 8.9–10.3)
Chloride: 99 mmol/L — ABNORMAL LOW (ref 101–111)
Creatinine, Ser: 1.2 mg/dL (ref 0.61–1.24)
GFR, EST NON AFRICAN AMERICAN: 57 mL/min — AB (ref >60)
GLUCOSE: 122 mg/dL — AB (ref 65–99)
POTASSIUM: 4.4 mmol/L (ref 3.5–5.1)
Sodium: 136 mmol/L (ref 135–145)

## 2016-05-28 LAB — BRAIN NATRIURETIC PEPTIDE: B NATRIURETIC PEPTIDE 5: 420.9 pg/mL — AB (ref 0.0–100.0)

## 2016-05-28 MED FILL — POTASSIUM CL 10 MEQ TAB SA: 10 | 30 days supply | Qty: 60 | Fill #1

## 2016-05-28 NOTE — Patient Instructions (Signed)
Increase Adcirca back to 40 mg daily  Labs today  Your physician recommends that you schedule a follow-up appointment in: 3 months

## 2016-05-28 NOTE — Progress Notes (Signed)
Patient ID: [name], PhD, male   DOB: September 19, 1940, 76 y.o.   MRN: [id number]    Advanced Heart Failure Clinic Note   PCP: [name] EP: [name] Cardiology: [name]  76 yo with complex past history presents for heart failure evaluation.  Patient had anterior MI in 2004 and developed ischemic cardiomyopathy as well as mitral regurgitation. He also developed atrial fibrillation.  In 10/14, he had MV repair, Maze, CABG with LIMA-LAD, and LA appendage closure at [facility] in [place].  Subsequently, atrial fibrillation returned and he had an atrial fibrillation ablation in [facility] in 3/15.  He wore a Zio patch in 12/15 and had a low atrial fibrillation burden of 7%.  He has had a long-standing ischemic cardiomyopathy.  In 2015, EF was 20-25%.  Echo in 2016 also showed EF 25-30% but estimated PA pressure suggested severe pulmonary hypertension, which is new for him.    At initial appointment, he reported increased exertional dyspnea over a number of weeks.  RHC was done, showing mildly elevated PCWP with moderate pulmonary HTN and low cardiac index (1.93 thermo, 2.13 Fick).  V/Q scan showed no PE.  I started him on digoxin and have titrated his Lasix to 80 mg daily.  He is now on bisoprolol and able to tolerate it.  At last appointment, I had him try replacing valsartan with Entresto 24/26 bid.  He was unable to tolerate it (made him dizzy, though BP was not low).  Therefore, I had him restart valsartan.  CPX in 4/16 showed mildly decreased functional capacity.  He has tolerated Adcirca 20 mg daily.  He did not tolerate an attempt to uptitrate bisoprolol.  He tried CPAP but was unable to tolerate it.  He was unable to tolerate eplerenone 50 mg daily.  Last echo in 9/17 showed EF 20-25%, stable MV repair, normal RV, and PA systolic pressure 86 mmHg.   He had a Lexiscan Cardiolite in 9/17 that showed infarction, no ischemia.  [place] in 10/17 showed severe mixed pulmonary venous HTN/pulmonary  arterial HTN with PVR 4.3 WU and PCWP mildly elevated.  Adcirca was increased to 40 mg daily and Lasix was increased to 80 qam/40 qpm.  I switched him over to torsemide because of increased weight.  However, he started back on Lasix as he did not think torsemide was as effective.   At a prior appointment, he was having more palpitations.  Event monitor in 11/17 showed atrial fibrillation episodes, these are symptomatic.  Since then, the atrial fibrillation seems to have decreased.  He is in NSR today.  He saw [name] to discuss Tikosyn initiation.  No decision was reached at that time, and since palpitations have decreased again, he wants to hold off on Tikosyn for now.   He is generally doing well.  He saw a physician at [facility] in [place] and is now taking a couple of new supplements. He had a number of blood tests done in [place].  He had a redo sleep study that showed significant OSA.  He is only taking Adcirca 20 mg daily as he was running low.  No dyspnea walking on flat ground or up a flight of steps.  No orthopnea/PND.  He cannot tolerate CPAP but is using his oral appliance.  No chest pain.   ECG (reviewed personally): NSR, 1st degree AVB, IVCD 130 msec  6 minute walk (3/16): 381 m  6 minute walk (5/16): 414.5 m 6 minute walk (10/16): 562 m  6 minute walk (1/17): 469 m 6 minute walk (8/17): 488 m  Labs (2/15): LDL 144 Labs (8/15): K 4.6, creatinine 0.9 Labs (12/15): HCT 42.3   Labs (2/16): K 4 => 4.2, creatinine 1.05 => 0.92, BNP 268 Labs (3/16): BNP 495 => 320, digoxin 0.4, RF 14.7 (very mild increase), TSH normal, HIV negative, anti-SCL70 negative, creatinine 0.91, K 4.4 Labs (7/16): K 5, creatinine 1.05, BNP 455, vitamin D normal, digoxin 0.4, B12 normal Labs (8/16): digoxin 0.8, BNP 305, K 4.2, creatinine 0.99 Labs (9/16): HCT 43.3, TSH normal, BNP 492 => 278, K 4.3, creatinine 0.97 => 1.04, digoxin 0.6, TSH normal, LDL 154, LDL-P 1654.  Labs (10/16): K 4.7, creatinine  1.1 Labs (1/17): pro-BNP 2065, digoxin 0.3, K 4.3, creatinine 1.10 => 1.28 Labs (3/17): K 4.1, creatinine 1.09, HCT 38.3 Labs (4/17): K 4.4, creatinine 0.99, digoxin 0.8 Labs (5/17): K 4.3, creatinine 1.08, BNP 317, hgb 13.1 Labs (8/17): K 4.6, creatinine 0.99, BNP 392, digoxin 0.5 Labs (9/17): K 4.4, creatinine 1.05 Labs (10/17): digoxin 0.5, K 4.1, creatinine 1.12, HCT 44.9, digoxin 0.5 Labs (11/17): K 4 => 3.8, creatinine 1.3 => 1.16, digoxin 0.5, BNP 296 Labs (12/17): K 4.1, creatinine 1.15, BNP 294, digoxin 0.7 Labs (2/18): LDL 135, Lp(a) 124, LDL-P 979   PMH: 1. CAD: Anterior MI in 2004.  Cardiac surgery in 10/14 included LIMA-LAD.  - Lexiscan Cardiolite (9/17): EF 35%, infarct present with no ischemia.  2. Chronic mitral regurgitation: 10/14 surgery at [facility] with MV repair, Maze, LIMA-LAD, and LA appendage closure.  3. Atrial fibrillation: Paroxysmal.  He was initially on Tikosyn but had breakthrough atrial fibrillation.  H/o Maze in 2014.  Had recurrent atrial fibrillation with ablation in 3/15 by Dr Ola Spurr in [facility].  Not anticoagulated after 2 severe prostate bleeding episodes.  LA appendage was clipped with MV surgery.  - Zio patch in 12/15 with low atrial fibrillation burden (7%).  - Holter (9/16) with PACs, PVCs, short atrial fibrillation runs (nothing sustained). - Event monitor (11/17) with runs of atrial fibrillation and flutter.   4. Chronotropic incompetence.  5. HTN 6. Hyperlipidemia: Refuses statin.  7. Peripheral neuropathy 8. GERD 9. H/o BPPV 10. H/o TIA 11. Ischemic cardiomyopathy: cardiac MRI 7/14 with EF 35%, moderate MR, normal RV size and systolic function, extensive anterior and anteroseptal LGE suggestive of non-viable myocardium (this was prior to LIMA-LAD).  Echo 1/15 with EF 20-25%, moderate AI, PA systolic pressure 39 mmHg. Echo (1/16) with EF 20-25%, diffuse hypokinesis with regionality, moderate LV dilation, moderate AI, s/p MV repair with  mild MR and normal gradients, RV dilated with mildly decreased systolic function, PA systolic pressure 71 mmHg.   - RHC (2/16) with mean RA 9, PA 61/25 mean 40, mean PCWP 23, CI 2.13/PVR 4.3 (Fick), CI 1.93/PVR 4.7 (thermo).   - CPX (4/16) with peak VO2 17.9, VE/VCO2 34.7 => mildly decreased functional capacity.  - Spironolactone apparently caused cognitive deficits - Intolerant of Coreg due to development of severe alopecia - Unable to uptitrate bisoprolol due to intolerance.  - Lightheaded with Entresto.  - Unable to tolerate increase in valsartan to 80 mg bid.  - Echo (1/17) with EF 30-35%, moderate LV dilation, mild AI, s/p MV repair with mild MR, moderately dilated RV with mildly decreased systolic function, PA systolic pressure 69 mmHg.  - CPX (4/17): peak VO2 18, VE/VCO2 slope 33, RER 1.28 => mild to moderate functional impairment, mildly improved.  - Echo (9/17): EF 20-25% with regional WMAs,  normal RV size and systolic function, PASP 86 mmHg, stable repaired mitral valve with mild MR, moderate AI.  - RHC (10/17): mean RA 9, PA 70/23 mean 43, PCWP mean 20, CI 2.96 Fick/2.84 Thermo, PVR 4.3 WU.  12. Carotid stenosis: Carotid dopplers (1/16) with 40-59% bilateral ICA stenosis. Carotid dopplers (4/17) with 40-59% BICA stenosis.  - Carotid dopplers (2/18) with < 50% BICA stenosis.  13. Aortic insufficiency: Moderate by last echo 9/17.  14. Pulmonary HTN: Mixed PAH and pulmonary venous hypertension.  PFTs (9/14) were normal.  V/Q scan (2/16) with no evidence of acute or chronic PE.  6 minute walk (3/16) 381 m. 6 minute walk (5/16) 414.5 m. 6 minute walk (1/17) 469 m.  15. OSA: Moderate on 5/16 sleep study. Unable to tolerate CPAP.  Repeat sleep study at [facility] in 2/18 was also suggestive of OSA.  16. Venous insufficiency 17. Ventral hernia  SH: Married, lives in [place], Engineer, water, nonsmoker  FH: CAD  ROS: All systems reviewed and negative except as per HPI.   Current  Outpatient Prescriptions  Medication Sig Dispense Refill  . aspirin 81 MG chewable tablet Chew 81 mg by mouth daily.    . bisoprolol (ZEBETA) 5 MG tablet TAKE 1/2 TABLET BY MOUTH DAILY 45 tablet 3  . digoxin (LANOXIN) 0.125 MG tablet TAKE 1 TABLET BY MOUTH ONCE DAILY 90 tablet 3  . eplerenone (INSPRA) 50 MG tablet Take 25 mg by mouth daily.    . famotidine (PEPCID) 20 MG tablet Take 20 mg by mouth daily as needed for heartburn or indigestion.    . furosemide (LASIX) 20 MG tablet Take 3 tablets (60 mg total) by mouth 2 (two) times daily. 180 tablet 3  . Magnesium 100 MG CAPS Take 1 capsule by mouth 3 (three) times daily.     . Multiple Vitamins-Minerals (MULTIVITAMIN WITH MINERALS) tablet Take 1 tablet by mouth daily.    . Omega-3 Fatty Acids (THE VERY FINEST FISH OIL) LIQD Take 2-3 g by mouth daily.     . potassium chloride (K-DUR,KLOR-CON) 10 MEQ tablet Take 2 tablets (20 mEq total) by mouth daily. 60 tablet 3  . tadalafil, PAH, (ADCIRCA) 20 MG tablet Take 20 mg by mouth daily.    . valsartan (DIOVAN) 40 MG tablet TAKE 1 TABLET BY MOUTH EVERY MORNING AND 2 TABLETS EVERY EVENING 135 tablet 1  . Zinc 100 MG TABS Take 1 tablet by mouth daily.      No current facility-administered medications for this encounter.    BP 138/65   Pulse 60   Wt 159 lb 8 oz (72.3 kg)   SpO2 100%   BMI 23.55 kg/m    Wt Readings from Last 3 Encounters:  05/28/16 159 lb 8 oz (72.3 kg)  02/28/16 157 lb (71.2 kg)  02/27/16 158 lb 3.2 oz (71.8 kg)     General: NAD Neck: JVP 7 cm, no thyromegaly or thyroid nodule.  Lungs: Clear to auscultation bilaterally with normal respiratory effort. CV: Lateral PMI.  Heart regular S1/S2, no S3/S4, 2/6 SEM RUSB.  1+ ankle edema bilaterally.  No carotid bruit.  Normal pedal pulses.  Abdomen: Soft, NT, ND, no HSM. No bruits or masses. +BS.  Small ventral hernia, reducible.  Neurologic: Alert and oriented x 3.  Psych: Normal affect. Extremities: No clubbing or cyanosis.   HEENT: Normal.   Assessment/Plan: 1. CAD: S/p LIMA-LAD.  He is on ASA 81.  He has decided not to take statins after reviewing the data (  I did recommend taking a statin but we have agreed to disagree on this).  Lexiscan Cardiolite was done in 9/17 due to atypical chest pain.  This showed prior infarction with no ischemia.  No recurrence of chest pain since that time despite ongoing exercise.   2. S/p mitral valve repair: The MV repair looked stable on 9/17 echo with no evidence for significant mitral stenosis.  3. Aortic insufficiency: Moderate on 9/17 echo.  Continue to follow.  4. Chronic systolic CHF: Ischemic cardiomyopathy, EF 20-25% on 9/17 echo.  PA pressure remains elevated by doppler measurement but RV systolic function was normal.  Mildly decreased functional capacity on 4/16 CPX, this was mildly improved on 4/17 CPX.  NYHA class II.  He is not volume overloaded on exam.  - Continue Lasix 60 mg bid, BMET/BNP today.  - Continue digoxin, check level today.  - Continue bisoprolol 2.5 mg daily (unable to tolerate increase).   - Continue valsartan 40 qam/80 qpm.  He did not tolerate Entresto or further uptitration of valsartan.    - Continue eplerenone 25 (unable to tolerate increase).        - He has refused ICD in the past.  - In the future, he could be an LVAD candidate if he worsens.   5. Atrial fibrillation: s/p Maze in 10/14, then ablation in 3/15.  Prior to Maze, he was on Tikosyn but had breakthrough.  Currently, he is not anticoagulated due to history of prostate bleeding and his choice.  He did have his LA appendage clipped at time of MV surgery.  At prior appointment, he was feeling more atrial fibrillation (confirmed by event monitor).  He is symptomatic with the arrhythmia (feels considerably more tired).  Recently, he has not felt as much atrial fibrillation.  Today he is in NSR.  - Continue bisoprolol.  - We have had discussions about what to do with his atrial fibrillation.  He  is symptomatic when in atrial fibrillation/fluttter.  He had breakthrough on Tikosyn in the past, but has had Maze and atrial fibrillation ablation since that time.  I suggested anticoagulation with Eliquis and re-attempting Tikosyn (cannot tell him definitively that he would be protected enough by the LA appendage clipping alone).  He talked to Dr Caryl Comes about this.  With a considerable decrease in atrial fibrillation symptoms (less palpitations), he opts to hold off on Tikosyn.  6. Pulmonary hypertension: Severe by most recent echo in 9/17 and on RHC in 10/17.  RV actually looked ok on 9/17 echo.  Mixed pulmonary venous and pulmonary arterial HTN on RHC.  It is possible that the [facility] component is due to pulmonary vascular remodeling in the setting of chronic mitral regurgitation prior to MV repair. Negative V/Q scan, no evidence for CTEPH.  PFTs normal in 9/14.  He is seeing Dr Lake Bells.  Sleep study showed moderate OSA but he has been unable to tolerated CPAP and is now using an oral device.  - Increase Adcirca back to 40 mg daily.  6 minute walk next appointment.    - Given advantage of combination therapy, we will need to address adding an ERA in the future.  7. OSA: Moderate.  Has not tolerated CPAP. Probably plays a role in recurrent atrial fibrillation and pulmonary hypertension.  He has been using his oral device. 8. Carotid stenosis: 2/18 carotid dopplers at [facility] with < 50% bilateral stenosis.    Followup 3 months  Loralie Champagne 05/28/2016

## 2016-06-03 ENCOUNTER — Telehealth: Payer: Self-pay | Admitting: Licensed Clinical Social Worker

## 2016-06-03 MED FILL — TORSEMIDE 20 MG TABLET: 20 | 30 days supply | Qty: 120 | Fill #2

## 2016-06-03 NOTE — Telephone Encounter (Signed)
CSW received copy of patient's Advanced Directive. CSW contacted patient by phone to inform original document should be keep with patient and copy will be submitted to medical records for scanning into medical record. CSW offered to make additional copies for patient use and per his request will mail back to patient's home. Raquel Sarna, [place], [place]

## 2016-06-22 MED FILL — EPLERENONE 25 MG TABLET: 25 | 30 days supply | Qty: 30 | Fill #5

## 2016-06-24 MED FILL — VALSARTAN 40 MG TABLET: 40 | 30 days supply | Qty: 90 | Fill #2

## 2016-07-02 DIAGNOSIS — R8290 Unspecified abnormal findings in urine: Secondary | ICD-10-CM | POA: Diagnosis not present

## 2016-07-02 DIAGNOSIS — N138 Other obstructive and reflux uropathy: Secondary | ICD-10-CM | POA: Diagnosis not present

## 2016-07-02 DIAGNOSIS — N390 Urinary tract infection, site not specified: Secondary | ICD-10-CM | POA: Diagnosis not present

## 2016-07-02 DIAGNOSIS — R309 Painful micturition, unspecified: Secondary | ICD-10-CM | POA: Diagnosis not present

## 2016-07-02 DIAGNOSIS — R339 Retention of urine, unspecified: Secondary | ICD-10-CM | POA: Diagnosis not present

## 2016-07-02 DIAGNOSIS — N401 Enlarged prostate with lower urinary tract symptoms: Secondary | ICD-10-CM | POA: Diagnosis not present

## 2016-07-02 MED FILL — CEPHALEXIN 500 MG CAPSULE: 500 | 7 days supply | Qty: 21 | Fill #0

## 2016-07-03 MED FILL — TAMSULOSIN HCL 0.4 MG CAP: 0.4 | 90 days supply | Qty: 90 | Fill #0

## 2016-07-15 MED FILL — ADCIRCA 20 MG TABLET: 20 | 30 days supply | Qty: 60 | Fill #3

## 2016-07-15 MED FILL — TORSEMIDE 20 MG TABLET: 20 | 30 days supply | Qty: 120 | Fill #3

## 2016-07-21 DIAGNOSIS — N401 Enlarged prostate with lower urinary tract symptoms: Secondary | ICD-10-CM | POA: Diagnosis not present

## 2016-07-21 DIAGNOSIS — N390 Urinary tract infection, site not specified: Secondary | ICD-10-CM | POA: Diagnosis not present

## 2016-07-21 DIAGNOSIS — N138 Other obstructive and reflux uropathy: Secondary | ICD-10-CM | POA: Diagnosis not present

## 2016-07-21 DIAGNOSIS — R39198 Other difficulties with micturition: Secondary | ICD-10-CM | POA: Diagnosis not present

## 2016-07-21 DIAGNOSIS — R3915 Urgency of urination: Secondary | ICD-10-CM | POA: Diagnosis not present

## 2016-07-21 DIAGNOSIS — N3000 Acute cystitis without hematuria: Secondary | ICD-10-CM | POA: Diagnosis not present

## 2016-07-21 MED FILL — FINASTERIDE 5 MG TABLET: 5 | 30 days supply | Qty: 30 | Fill #0

## 2016-07-22 MED FILL — EPLERENONE 25 MG TABLET: 25 | 30 days supply | Qty: 30 | Fill #6

## 2016-07-31 ENCOUNTER — Telehealth (HOSPITAL_COMMUNITY): Payer: Self-pay

## 2016-07-31 ENCOUNTER — Ambulatory Visit (HOSPITAL_COMMUNITY)
Admission: RE | Admit: 2016-07-31 | Discharge: 2016-07-31 | Disposition: A | Discharge status: Home / Self Care | Payer: Medicare Other | Source: Ambulatory Visit | Attending: Cardiology | Admitting: Cardiology

## 2016-07-31 DIAGNOSIS — R252 Cramp and spasm: Secondary | ICD-10-CM

## 2016-07-31 LAB — BASIC METABOLIC PANEL
Anion gap: 9 (ref 5–15)
BUN: 31 mg/dL — ABNORMAL HIGH (ref 6–20)
CALCIUM: 9.1 mg/dL (ref 8.9–10.3)
CO2: 30 mmol/L (ref 22–32)
Chloride: 97 mmol/L — ABNORMAL LOW (ref 101–111)
Creatinine, Ser: 1.07 mg/dL (ref 0.61–1.24)
GFR calc Af Amer: >60 mL/min (ref >60)
GLUCOSE: 123 mg/dL — AB (ref 65–99)
POTASSIUM: 4.3 mmol/L (ref 3.5–5.1)
SODIUM: 136 mmol/L (ref 135–145)

## 2016-07-31 LAB — MAGNESIUM: MAGNESIUM: 2.5 mg/dL — AB (ref 1.7–2.4)

## 2016-07-31 NOTE — Telephone Encounter (Signed)
Patient calling CHF clinic triage line c/o severe bilateral leg cramps up to thighs overnight. Per [name] will have him come to CHF clinic for lab apt to check bmet and mag.  Renee Pain, RN

## 2016-08-06 ENCOUNTER — Ambulatory Visit (HOSPITAL_COMMUNITY)
Admission: RE | Admit: 2016-08-06 | Discharge: 2016-08-06 | Disposition: A | Discharge status: Home / Self Care | Payer: Medicare Other | Source: Ambulatory Visit | Attending: Internal Medicine | Admitting: Internal Medicine

## 2016-08-06 ENCOUNTER — Telehealth (HOSPITAL_COMMUNITY): Payer: Self-pay | Admitting: *Deleted

## 2016-08-06 DIAGNOSIS — R9431 Abnormal electrocardiogram [ECG] [EKG]: Secondary | ICD-10-CM | POA: Insufficient documentation

## 2016-08-06 DIAGNOSIS — I5022 Chronic systolic (congestive) heart failure: Secondary | ICD-10-CM

## 2016-08-06 DIAGNOSIS — R0989 Other specified symptoms and signs involving the circulatory and respiratory systems: Secondary | ICD-10-CM | POA: Insufficient documentation

## 2016-08-06 DIAGNOSIS — I499 Cardiac arrhythmia, unspecified: Secondary | ICD-10-CM

## 2016-08-06 NOTE — Telephone Encounter (Signed)
Patient came in for an ECG today. Dr.McLean reviewed as junctional rhythm. Patient advised to stop bisoprolol until office visit on 5/24.  Patient was also ordered a 48 hour holter monitor. Referral placed with [facility] (Dr.Byers) for vertigo. Pt aware.

## 2016-08-07 DIAGNOSIS — R42 Dizziness and giddiness: Secondary | ICD-10-CM | POA: Diagnosis not present

## 2016-08-10 DIAGNOSIS — R42 Dizziness and giddiness: Secondary | ICD-10-CM | POA: Diagnosis not present

## 2016-08-13 ENCOUNTER — Encounter (HOSPITAL_COMMUNITY): Payer: Self-pay

## 2016-08-13 ENCOUNTER — Ambulatory Visit (INDEPENDENT_AMBULATORY_CARE_PROVIDER_SITE_OTHER): Payer: Medicare Other

## 2016-08-13 ENCOUNTER — Other Ambulatory Visit (HOSPITAL_COMMUNITY): Payer: Self-pay | Admitting: Cardiology

## 2016-08-13 ENCOUNTER — Ambulatory Visit (HOSPITAL_COMMUNITY)
Admission: RE | Admit: 2016-08-13 | Discharge: 2016-08-13 | Disposition: A | Discharge status: Home / Self Care | Payer: Medicare Other | Source: Ambulatory Visit | Attending: Internal Medicine | Admitting: Internal Medicine

## 2016-08-13 VITALS — BP 143/65 | HR 73 | Wt 156.5 lb

## 2016-08-13 DIAGNOSIS — G629 Polyneuropathy, unspecified: Secondary | ICD-10-CM | POA: Insufficient documentation

## 2016-08-13 DIAGNOSIS — Z9889 Other specified postprocedural states: Secondary | ICD-10-CM | POA: Diagnosis not present

## 2016-08-13 DIAGNOSIS — I480 Paroxysmal atrial fibrillation: Secondary | ICD-10-CM

## 2016-08-13 DIAGNOSIS — Z8249 Family history of ischemic heart disease and other diseases of the circulatory system: Secondary | ICD-10-CM | POA: Diagnosis not present

## 2016-08-13 DIAGNOSIS — I5022 Chronic systolic (congestive) heart failure: Secondary | ICD-10-CM

## 2016-08-13 DIAGNOSIS — Z955 Presence of coronary angioplasty implant and graft: Secondary | ICD-10-CM | POA: Diagnosis not present

## 2016-08-13 DIAGNOSIS — I498 Other specified cardiac arrhythmias: Secondary | ICD-10-CM

## 2016-08-13 DIAGNOSIS — I110 Hypertensive heart disease with heart failure: Secondary | ICD-10-CM | POA: Insufficient documentation

## 2016-08-13 DIAGNOSIS — I272 Pulmonary hypertension, unspecified: Secondary | ICD-10-CM | POA: Diagnosis not present

## 2016-08-13 DIAGNOSIS — E785 Hyperlipidemia, unspecified: Secondary | ICD-10-CM | POA: Diagnosis not present

## 2016-08-13 DIAGNOSIS — Z7982 Long term (current) use of aspirin: Secondary | ICD-10-CM | POA: Insufficient documentation

## 2016-08-13 DIAGNOSIS — I6523 Occlusion and stenosis of bilateral carotid arteries: Secondary | ICD-10-CM | POA: Insufficient documentation

## 2016-08-13 DIAGNOSIS — K219 Gastro-esophageal reflux disease without esophagitis: Secondary | ICD-10-CM | POA: Diagnosis not present

## 2016-08-13 DIAGNOSIS — I252 Old myocardial infarction: Secondary | ICD-10-CM | POA: Insufficient documentation

## 2016-08-13 LAB — BASIC METABOLIC PANEL
ANION GAP: 9 (ref 5–15)
BUN: 21 mg/dL — ABNORMAL HIGH (ref 6–20)
CALCIUM: 9.4 mg/dL (ref 8.9–10.3)
CO2: 28 mmol/L (ref 22–32)
Chloride: 100 mmol/L — ABNORMAL LOW (ref 101–111)
Creatinine, Ser: 0.95 mg/dL (ref 0.61–1.24)
GLUCOSE: 95 mg/dL (ref 65–99)
POTASSIUM: 4 mmol/L (ref 3.5–5.1)
SODIUM: 137 mmol/L (ref 135–145)

## 2016-08-13 LAB — CBC
HCT: 44 % (ref 39.0–52.0)
HEMOGLOBIN: 14.4 g/dL (ref 13.0–17.0)
MCH: 30.4 pg (ref 26.0–34.0)
MCHC: 32.7 g/dL (ref 30.0–36.0)
MCV: 93 fL (ref 78.0–100.0)
PLATELETS: 170 10*3/uL (ref 150–400)
RBC: 4.73 MIL/uL (ref 4.22–5.81)
RDW: 14.3 % (ref 11.5–15.5)
WBC: 5.9 10*3/uL (ref 4.0–10.5)

## 2016-08-13 LAB — BRAIN NATRIURETIC PEPTIDE: B NATRIURETIC PEPTIDE 5: 224.5 pg/mL — AB (ref 0.0–100.0)

## 2016-08-13 LAB — DIGOXIN LEVEL: DIGOXIN LVL: 0.6 ng/mL — AB (ref 0.8–2.0)

## 2016-08-13 NOTE — Patient Instructions (Addendum)
STOP Bisoprolol until you hear otherwise  Labs today (will call for abnormal results, otherwise no news is good news)  Next Appointment is scheduled for June 7th @ 9:40am with [name]

## 2016-08-13 NOTE — Progress Notes (Signed)
Patient ID: [name], PhD, male   DOB: 1940-08-25, 76 y.o.   MRN: [id number]    Advanced Heart Failure Clinic Note   PCP: [name] EP: [name] Cardiology: [name]  76 yo with complex past history presents for heart failure evaluation.  Patient had anterior MI in 2004 and developed ischemic cardiomyopathy as well as mitral regurgitation. He also developed atrial fibrillation.  In 10/14, he had MV repair, Maze, CABG with LIMA-LAD, and LA appendage closure at [facility] in [place].  Subsequently, atrial fibrillation returned and he had an atrial fibrillation ablation in [facility] in 3/15.  He wore a Zio patch in 12/15 and had a low atrial fibrillation burden of 7%.  He has had a long-standing ischemic cardiomyopathy.  In 2015, EF was 20-25%.  Echo in 2016 also showed EF 25-30% but estimated PA pressure suggested severe pulmonary hypertension, which is new for him.    At initial appointment, he reported increased exertional dyspnea over a number of weeks.  RHC was done, showing mildly elevated PCWP with moderate pulmonary HTN and low cardiac index (1.93 thermo, 2.13 Fick).  V/Q scan showed no PE.  I started him on digoxin and have titrated his Lasix to 80 mg daily.  He is now on bisoprolol and able to tolerate it.  At last appointment, I had him try replacing valsartan with Entresto 24/26 bid.  He was unable to tolerate it (made him dizzy, though BP was not low).  Therefore, I had him restart valsartan.  CPX in 4/16 showed mildly decreased functional capacity.  He has tolerated Adcirca 20 mg daily.  He did not tolerate an attempt to uptitrate bisoprolol.  He tried CPAP but was unable to tolerate it.  He was unable to tolerate eplerenone 50 mg daily.  Last echo in 9/17 showed EF 20-25%, stable MV repair, normal RV, and PA systolic pressure 86 mmHg.   He had a Lexiscan Cardiolite in 9/17 that showed infarction, no ischemia.  [place] in 10/17 showed severe mixed pulmonary venous HTN/pulmonary  arterial HTN with PVR 4.3 WU and PCWP mildly elevated.  Adcirca was increased to 40 mg daily and Lasix was increased to 80 qam/40 qpm.  I switched him over to torsemide because of increased weight.  However, he started back on Lasix as he did not think torsemide was as effective.   At a prior appointment, he was having more palpitations.  Event monitor in 11/17 showed atrial fibrillation episodes, these are symptomatic.  Since then, the atrial fibrillation seems to have decreased.  He is in NSR today.  He saw [name] to discuss Tikosyn initiation.  No decision was reached at that time, and since palpitations have decreased again, he wants to hold off on Tikosyn for now.   He has OSA.  He cannot tolerate CPAP but is using his oral appliance.    He developed vertigo not long ago.  He ended up seeing an ENT.  The vertigo was quite significant with nausea.  It was not BPPV.  The symptoms gradually resolved and are now mostly gone.  He was worked in to the office today due to episodes of low heart rate.  He noted his HR in the 40s and came in for an ECG on 5/17, showing junctional rhythm (versus ectopic atrial rhythm with very small ectopic P waves) in the 40s.  I stopped bisoprolol.  Today, he is in the same junctional rhythm (versus ectopic atrial rhythm) but HR is  now 12.  He feels quite good now that the vertigo has resolved.  No exertional dyspnea.  No dizziness/lightheadedness/falls.  No chest pain.  No orthopnea/PND.    ECG (personally reviewed) 5/17: junctional rhythm (versus ectopic atrial rhythm with small Ps), HR in 40s.   ECG (personally reviewed) today: junctional rhythm (versus ectopic atrial rhythm with small Ps), HR 68  6 minute walk (3/16): 381 m  6 minute walk (5/16): 414.5 m 6 minute walk (10/16): 562 m 6 minute walk (1/17): 469 m 6 minute walk (8/17): 488 m  Labs (2/15): LDL 144 Labs (8/15): K 4.6, creatinine 0.9 Labs (12/15): HCT 42.3   Labs (2/16): K 4 => 4.2, creatinine 1.05  => 0.92, BNP 268 Labs (3/16): BNP 495 => 320, digoxin 0.4, RF 14.7 (very mild increase), TSH normal, HIV negative, anti-SCL70 negative, creatinine 0.91, K 4.4 Labs (7/16): K 5, creatinine 1.05, BNP 455, vitamin D normal, digoxin 0.4, B12 normal Labs (8/16): digoxin 0.8, BNP 305, K 4.2, creatinine 0.99 Labs (9/16): HCT 43.3, TSH normal, BNP 492 => 278, K 4.3, creatinine 0.97 => 1.04, digoxin 0.6, TSH normal, LDL 154, LDL-P 1654.  Labs (10/16): K 4.7, creatinine 1.1 Labs (1/17): pro-BNP 2065, digoxin 0.3, K 4.3, creatinine 1.10 => 1.28 Labs (3/17): K 4.1, creatinine 1.09, HCT 38.3 Labs (4/17): K 4.4, creatinine 0.99, digoxin 0.8 Labs (5/17): K 4.3, creatinine 1.08, BNP 317, hgb 13.1 Labs (8/17): K 4.6, creatinine 0.99, BNP 392, digoxin 0.5 Labs (9/17): K 4.4, creatinine 1.05 Labs (10/17): digoxin 0.5, K 4.1, creatinine 1.12, HCT 44.9, digoxin 0.5 Labs (11/17): K 4 => 3.8, creatinine 1.3 => 1.16, digoxin 0.5, BNP 296 Labs (12/17): K 4.1, creatinine 1.15, BNP 294, digoxin 0.7 Labs (2/18): LDL 135, Lp(a) 124, LDL-P 979  Labs (5/18): K 4.3, creatinine 1.07  PMH: 1. CAD: Anterior MI in 2004.  Cardiac surgery in 10/14 included LIMA-LAD.  - Lexiscan Cardiolite (9/17): EF 35%, infarct present with no ischemia.  2. Chronic mitral regurgitation: 10/14 surgery at [facility] with MV repair, Maze, LIMA-LAD, and LA appendage closure.  3. Atrial fibrillation: Paroxysmal.  He was initially on Tikosyn but had breakthrough atrial fibrillation.  H/o Maze in 2014.  Had recurrent atrial fibrillation with ablation in 3/15 by Dr Ola Spurr in [facility].  Not anticoagulated after 2 severe prostate bleeding episodes.  LA appendage was clipped with MV surgery.  - Zio patch in 12/15 with low atrial fibrillation burden (7%).  - Holter (9/16) with PACs, PVCs, short atrial fibrillation runs (nothing sustained). - Event monitor (11/17) with runs of atrial fibrillation and flutter.   4. Chronotropic incompetence.  5.  HTN 6. Hyperlipidemia: Refuses statin.  7. Peripheral neuropathy 8. GERD 9. H/o BPPV 10. H/o TIA 11. Ischemic cardiomyopathy: cardiac MRI 7/14 with EF 35%, moderate MR, normal RV size and systolic function, extensive anterior and anteroseptal LGE suggestive of non-viable myocardium (this was prior to LIMA-LAD).  Echo 1/15 with EF 20-25%, moderate AI, PA systolic pressure 39 mmHg. Echo (1/16) with EF 20-25%, diffuse hypokinesis with regionality, moderate LV dilation, moderate AI, s/p MV repair with mild MR and normal gradients, RV dilated with mildly decreased systolic function, PA systolic pressure 71 mmHg.   - RHC (2/16) with mean RA 9, PA 61/25 mean 40, mean PCWP 23, CI 2.13/PVR 4.3 (Fick), CI 1.93/PVR 4.7 (thermo).   - CPX (4/16) with peak VO2 17.9, VE/VCO2 34.7 => mildly decreased functional capacity.  - Spironolactone apparently caused cognitive deficits - Intolerant of Coreg due to  development of severe alopecia - Unable to uptitrate bisoprolol due to intolerance.  - Lightheaded with Entresto.  - Unable to tolerate increase in valsartan to 80 mg bid.  - Echo (1/17) with EF 30-35%, moderate LV dilation, mild AI, s/p MV repair with mild MR, moderately dilated RV with mildly decreased systolic function, PA systolic pressure 69 mmHg.  - CPX (4/17): peak VO2 18, VE/VCO2 slope 33, RER 1.28 => mild to moderate functional impairment, mildly improved.  - Echo (9/17): EF 20-25% with regional WMAs, normal RV size and systolic function, PASP 86 mmHg, stable repaired mitral valve with mild MR, moderate AI.  - RHC (10/17): mean RA 9, PA 70/23 mean 43, PCWP mean 20, CI 2.96 Fick/2.84 Thermo, PVR 4.3 WU.  12. Carotid stenosis: Carotid dopplers (1/16) with 40-59% bilateral ICA stenosis. Carotid dopplers (4/17) with 40-59% BICA stenosis.  - Carotid dopplers (2/18) with < 50% BICA stenosis.  13. Aortic insufficiency: Moderate by last echo 9/17.  14. Pulmonary HTN: Mixed PAH and pulmonary venous hypertension.   PFTs (9/14) were normal.  V/Q scan (2/16) with no evidence of acute or chronic PE.  6 minute walk (3/16) 381 m. 6 minute walk (5/16) 414.5 m. 6 minute walk (1/17) 469 m.  15. OSA: Moderate on 5/16 sleep study. Unable to tolerate CPAP.  Repeat sleep study at [facility] in 2/18 was also suggestive of OSA.  16. Venous insufficiency 17. Ventral hernia 18. Junctional rhythm  SH: Married, lives in [place], Engineer, water, nonsmoker  FH: CAD  ROS: All systems reviewed and negative except as per HPI.   Current Outpatient Prescriptions  Medication Sig Dispense Refill  . aspirin 81 MG chewable tablet Chew 81 mg by mouth daily.    . digoxin (LANOXIN) 0.125 MG tablet TAKE 1 TABLET BY MOUTH ONCE DAILY 90 tablet 3  . eplerenone (INSPRA) 50 MG tablet Take 25 mg by mouth daily.    . famotidine (PEPCID) 20 MG tablet Take 20 mg by mouth daily as needed for heartburn or indigestion.    . Magnesium 100 MG CAPS Take 1 capsule by mouth 3 (three) times daily.     . Multiple Vitamins-Minerals (MULTIVITAMIN WITH MINERALS) tablet Take 1 tablet by mouth daily.    . Omega-3 Fatty Acids (THE VERY FINEST FISH OIL) LIQD Take 2-3 g by mouth daily.     . potassium chloride (K-DUR,KLOR-CON) 10 MEQ tablet Take 2 tablets (20 mEq total) by mouth daily. 60 tablet 3  . tadalafil, PAH, (ADCIRCA) 20 MG tablet Take 20 mg by mouth daily.    Marland Kitchen torsemide (DEMADEX) 20 MG tablet Take 40 mg by mouth daily.    . valsartan (DIOVAN) 40 MG tablet TAKE 1 TABLET BY MOUTH EVERY MORNING AND 2 TABLETS EVERY EVENING 135 tablet 1  . Zinc 100 MG TABS Take 1 tablet by mouth daily.      No current facility-administered medications for this encounter.    BP (!) 143/65   Pulse 73   Wt 156 lb 8 oz (71 kg)   SpO2 100%   BMI 23.11 kg/m    Wt Readings from Last 3 Encounters:  08/13/16 156 lb 8 oz (71 kg)  05/28/16 159 lb 8 oz (72.3 kg)  02/28/16 157 lb (71.2 kg)     General: NAD Neck: No JVD, no thyromegaly or thyroid nodule.  Lungs:  CTAB. CV: Lateral PMI.  Heart regular S1/S2, no S3/S4, 2/6 SEM RUSB.  No edema.  No carotid bruit.  Normal pedal pulses.  Abdomen: Soft, NT, ND, no HSM. No bruits or masses. +BS.  Small ventral hernia, reducible.  Neurologic: Alert and oriented x 3.  Psych: Normal affect. Extremities: No clubbing or cyanosis.  HEENT: Normal.   Assessment/Plan: 1. CAD: S/p LIMA-LAD.  He is on ASA 81.  He has decided not to take statins after reviewing the data (I did recommend taking a statin but we have agreed to disagree on this).  Lexiscan Cardiolite was done in 9/17 due to atypical chest pain.  This showed prior infarction with no ischemia.  No recurrence of chest pain since that time despite ongoing exercise.   2. S/p mitral valve repair: The MV repair looked stable on 9/17 echo with no evidence for significant mitral stenosis.  3. Aortic insufficiency: Moderate on 9/17 echo.  Continue to follow.  4. Chronic systolic CHF: Ischemic cardiomyopathy, EF 20-25% on 9/17 echo.  PA pressure remains elevated by doppler measurement but RV systolic function was normal.  Mildly decreased functional capacity on 4/16 CPX, this was mildly improved on 4/17 CPX.  NYHA class II.  He is not volume overloaded on exam, generally feeling well.  - Continue Lasix 60 mg bid, BMET/BNP today.  - Continue digoxin, check level today.  - Now off bisoprolol with junctional bradycardia, HR now improved.    - Continue valsartan 40 qam/80 qpm.  He did not tolerate Entresto or further uptitration of valsartan.    - Continue eplerenone 25 (unable to tolerate increase).        - He has refused ICD in the past.  - In the future, he could be an LVAD candidate if he worsens.   5. Atrial fibrillation: s/p Maze in 10/14, then ablation in 3/15.  Prior to Maze, he was on Tikosyn but had breakthrough.  Currently, he is not anticoagulated due to history of prostate bleeding and his choice.  He did have his LA appendage clipped at time of MV surgery.  At  prior appointment, he was feeling more atrial fibrillation (confirmed by event monitor).  He is symptomatic when he has atrial fibrillation (feels considerably more tired).  Today, he appears to be in a junctional rhythm.  - He is now off bisoprolol and HR is improved to the upper 60s.  - With junctional rhythm, he may have to stop digoxin. However, HR is now better and stopping digoxin may lead to symptomatic worsening.  I will arrange for a holter monitor x 48 hours to get a better idea of whether he is persistently in a junctional rhythm, and I will check a digoxin level.  - We have had discussions about what to do with his atrial fibrillation.  He is symptomatic when in atrial fibrillation/fluttter.  He had breakthrough on Tikosyn in the past, but has had Maze and atrial fibrillation ablation since that time.  I suggested anticoagulation with Eliquis and re-attempting Tikosyn (cannot tell him definitively that he would be protected enough by the LA appendage clipping alone).  He talked to Dr Caryl Comes about this.  He opted to hold off on Tikosyn, and now with junctional rhythm would not recommend it.  6. Pulmonary hypertension: Severe by most recent echo in 9/17 and on RHC in 10/17.  RV actually looked ok on 9/17 echo.  Mixed pulmonary venous and pulmonary arterial HTN on RHC.  It is possible that the [facility] component is due to pulmonary vascular remodeling in the setting of chronic mitral regurgitation prior to MV repair. Negative V/Q scan, no evidence  for CTEPH.  PFTs normal in 9/14.  He is seeing Dr Lake Bells.  Sleep study showed moderate OSA but he has been unable to tolerated CPAP and is now using an oral device.  - I had wanted him to increase Adcirca to 40 mg daily but he wants to stay on 20 mg daily.   - Given advantage of combination therapy, we will need to address adding an ERA in the future. I am not sure he will agree to doing this.  7. OSA: Moderate.  Has not tolerated CPAP. Probably plays a role in  recurrent atrial fibrillation and pulmonary hypertension.  He has been using his oral device. 8. Carotid stenosis: 2/18 carotid dopplers at [facility] with < 50% bilateral stenosis.    Followup 2 wks.   Loralie Champagne 08/13/2016

## 2016-08-19 ENCOUNTER — Telehealth (HOSPITAL_COMMUNITY): Payer: Self-pay | Admitting: *Deleted

## 2016-08-19 NOTE — Telephone Encounter (Signed)
We'll take a look at the final results.

## 2016-08-19 NOTE — Telephone Encounter (Signed)
Holter monitor company called to inform us patient had a minimum heart rate of 22bpm. He had 11 pauses longest one being 4.7 seconds.  Message routed to [place]

## 2016-08-20 ENCOUNTER — Other Ambulatory Visit (HOSPITAL_COMMUNITY): Payer: Self-pay | Admitting: Cardiology

## 2016-08-20 MED FILL — DIGOXIN 125 MCG TABLET: 125 | 90 days supply | Qty: 90 | Fill #1

## 2016-08-20 MED FILL — POTASSIUM CL 10 MEQ TAB SA: 10 | 30 days supply | Qty: 60 | Fill #2

## 2016-08-20 NOTE — Telephone Encounter (Signed)
Dr Aundra Dubin has reviewed monitor and is discussing w/Dr Caryl Comes for his recommendations

## 2016-08-21 ENCOUNTER — Other Ambulatory Visit (HOSPITAL_COMMUNITY): Payer: Self-pay | Admitting: *Deleted

## 2016-08-21 ENCOUNTER — Other Ambulatory Visit (HOSPITAL_COMMUNITY): Payer: Self-pay

## 2016-08-21 MED ORDER — EPLERENONE 50 MG PO TABS: 25.0000 mg | ORAL_TABLET | Freq: Every day | ORAL | 3 refills | Status: DC

## 2016-08-21 MED ORDER — EPLERENONE 25 MG PO TABS: 25.0000 mg | ORAL_TABLET | Freq: Every day | ORAL | 6 refills | Status: DC

## 2016-08-21 MED FILL — EPLERENONE 25 MG TABLET: 25 | 30 days supply | Qty: 30 | Fill #0

## 2016-08-27 ENCOUNTER — Encounter (HOSPITAL_COMMUNITY): Payer: Self-pay

## 2016-08-27 ENCOUNTER — Ambulatory Visit (HOSPITAL_COMMUNITY)
Admission: RE | Admit: 2016-08-27 | Discharge: 2016-08-27 | Disposition: A | Discharge status: Home / Self Care | Payer: Medicare Other | Source: Ambulatory Visit | Attending: Cardiology | Admitting: Cardiology

## 2016-08-27 VITALS — BP 143/51 | HR 81 | Wt 155.2 lb

## 2016-08-27 DIAGNOSIS — I2510 Atherosclerotic heart disease of native coronary artery without angina pectoris: Secondary | ICD-10-CM | POA: Diagnosis not present

## 2016-08-27 DIAGNOSIS — I6523 Occlusion and stenosis of bilateral carotid arteries: Secondary | ICD-10-CM | POA: Diagnosis not present

## 2016-08-27 DIAGNOSIS — I255 Ischemic cardiomyopathy: Secondary | ICD-10-CM | POA: Insufficient documentation

## 2016-08-27 DIAGNOSIS — Z7982 Long term (current) use of aspirin: Secondary | ICD-10-CM | POA: Diagnosis not present

## 2016-08-27 DIAGNOSIS — Z951 Presence of aortocoronary bypass graft: Secondary | ICD-10-CM | POA: Diagnosis not present

## 2016-08-27 DIAGNOSIS — I351 Nonrheumatic aortic (valve) insufficiency: Secondary | ICD-10-CM | POA: Diagnosis not present

## 2016-08-27 DIAGNOSIS — I2721 Secondary pulmonary arterial hypertension: Secondary | ICD-10-CM | POA: Diagnosis not present

## 2016-08-27 DIAGNOSIS — G4733 Obstructive sleep apnea (adult) (pediatric): Secondary | ICD-10-CM | POA: Insufficient documentation

## 2016-08-27 DIAGNOSIS — I4892 Unspecified atrial flutter: Secondary | ICD-10-CM | POA: Diagnosis not present

## 2016-08-27 DIAGNOSIS — R001 Bradycardia, unspecified: Secondary | ICD-10-CM | POA: Insufficient documentation

## 2016-08-27 DIAGNOSIS — Z9889 Other specified postprocedural states: Secondary | ICD-10-CM | POA: Diagnosis not present

## 2016-08-27 DIAGNOSIS — I5022 Chronic systolic (congestive) heart failure: Secondary | ICD-10-CM | POA: Diagnosis not present

## 2016-08-27 DIAGNOSIS — I480 Paroxysmal atrial fibrillation: Secondary | ICD-10-CM | POA: Insufficient documentation

## 2016-08-27 DIAGNOSIS — I272 Pulmonary hypertension, unspecified: Secondary | ICD-10-CM | POA: Diagnosis not present

## 2016-08-27 LAB — BASIC METABOLIC PANEL
ANION GAP: 11 (ref 5–15)
BUN: 21 mg/dL — ABNORMAL HIGH (ref 6–20)
CHLORIDE: 99 mmol/L — AB (ref 101–111)
CO2: 26 mmol/L (ref 22–32)
CREATININE: 0.96 mg/dL (ref 0.61–1.24)
Calcium: 9.5 mg/dL (ref 8.9–10.3)
GFR calc non Af Amer: >60 mL/min (ref >60)
Glucose, Bld: 106 mg/dL — ABNORMAL HIGH (ref 65–99)
POTASSIUM: 3.8 mmol/L (ref 3.5–5.1)
Sodium: 136 mmol/L (ref 135–145)

## 2016-08-27 MED ORDER — DIGOXIN 125 MCG PO TABS: 0.0625 mg | ORAL_TABLET | Freq: Every day | ORAL | 3 refills | Status: DC

## 2016-08-27 MED ORDER — BISOPROLOL FUMARATE 5 MG PO TABS: 2.5000 mg | ORAL_TABLET | Freq: Every day | ORAL | 3 refills | Status: DC

## 2016-08-27 NOTE — Progress Notes (Signed)
Patient ID: Caleb Moment, PhD, male   DOB: Jul 06, 1940, 77 y.o.   MRN: [id number]    Advanced Heart Failure Clinic Note   PCP: [name] EP: [name] Cardiology: [name]  76 yo with complex past history presents for heart failure evaluation.  Patient had anterior MI in 2004 and developed ischemic cardiomyopathy as well as mitral regurgitation. He also developed atrial fibrillation.  In 10/14, he had MV repair, Maze, CABG with LIMA-LAD, and LA appendage closure at [facility] in [place].  Subsequently, atrial fibrillation returned and he had an atrial fibrillation ablation in [facility] in 3/15.  He wore a Zio patch in 12/15 and had a low atrial fibrillation burden of 7%.  He has had a long-standing ischemic cardiomyopathy.  In 2015, EF was 20-25%.  Echo in 2016 also showed EF 25-30% but estimated PA pressure suggested severe pulmonary hypertension, which is new for him.    At initial appointment, he reported increased exertional dyspnea over a number of weeks.  RHC was done, showing mildly elevated PCWP with moderate pulmonary HTN and low cardiac index (1.93 thermo, 2.13 Fick).  V/Q scan showed no PE.  I started him on digoxin and have titrated his Lasix to 80 mg daily.  He is now on bisoprolol and able to tolerate it.  At last appointment, I had him try replacing valsartan with Entresto 24/26 bid.  He was unable to tolerate it (made him dizzy, though BP was not low).  Therefore, I had him restart valsartan.  CPX in 4/16 showed mildly decreased functional capacity.  He has tolerated Adcirca 20 mg daily.  He did not tolerate an attempt to uptitrate bisoprolol.  He tried CPAP but was unable to tolerate it.  He was unable to tolerate eplerenone 50 mg daily.  Last echo in 9/17 showed EF 20-25%, stable MV repair, normal RV, and PA systolic pressure 86 mmHg.   He had a Lexiscan Cardiolite in 9/17 that showed infarction, no ischemia.  [place] in 10/17 showed severe mixed pulmonary venous HTN/pulmonary  arterial HTN with PVR 4.3 WU and PCWP mildly elevated.  Adcirca was increased to 40 mg daily and Lasix was increased to 80 qam/40 qpm.  I switched him over to torsemide because of increased weight.  However, he started back on Lasix as he did not think torsemide was as effective.   At a prior appointment, he was having more palpitations.  Event monitor in 11/17 showed atrial fibrillation episodes, these are symptomatic.  Since then, the atrial fibrillation seems to have decreased.  He is in NSR today.  He saw [name] to discuss Tikosyn initiation.  No decision was reached at that time, and since palpitations have decreased again, he wants to hold off on Tikosyn for now.   He has OSA.  He cannot tolerate CPAP but is using his oral appliance.    HR was lower in 5/18.  He came into the office with HR around 40.  ECG showed an ectopic atrial rhythm with very small P waves. I stopped bisoprolol.  HR has gone up since.  Holter showed multiple pauses of 3-4 cm at night, likely related to untreated OSA.    He is feeling quite good today.  Occasional palpitations but not common.  Dyspnea with fast walking, but no dyspnea walking at a steady pace through his neighborhood.  No chest pain.  No orthopnea/PND.  Vertigo has resolved.  Improved 6 minute walk today.   ECG (personally reviewed):  ectopic atrial rhythm, IVCD 132 msec  6 minute walk (3/16): 381 m  6 minute walk (5/16): 414.5 m 6 minute walk (10/16): 562 m 6 minute walk (1/17): 469 m 6 minute walk (8/17): 488 m 6 minute walk (6/18): 549 m  Labs (2/15): LDL 144 Labs (8/15): K 4.6, creatinine 0.9 Labs (12/15): HCT 42.3   Labs (2/16): K 4 => 4.2, creatinine 1.05 => 0.92, BNP 268 Labs (3/16): BNP 495 => 320, digoxin 0.4, RF 14.7 (very mild increase), TSH normal, HIV negative, anti-SCL70 negative, creatinine 0.91, K 4.4 Labs (7/16): K 5, creatinine 1.05, BNP 455, vitamin D normal, digoxin 0.4, B12 normal Labs (8/16): digoxin 0.8, BNP 305, K 4.2,  creatinine 0.99 Labs (9/16): HCT 43.3, TSH normal, BNP 492 => 278, K 4.3, creatinine 0.97 => 1.04, digoxin 0.6, TSH normal, LDL 154, LDL-P 1654.  Labs (10/16): K 4.7, creatinine 1.1 Labs (1/17): pro-BNP 2065, digoxin 0.3, K 4.3, creatinine 1.10 => 1.28 Labs (3/17): K 4.1, creatinine 1.09, HCT 38.3 Labs (4/17): K 4.4, creatinine 0.99, digoxin 0.8 Labs (5/17): K 4.3, creatinine 1.08, BNP 317, hgb 13.1 Labs (8/17): K 4.6, creatinine 0.99, BNP 392, digoxin 0.5 Labs (9/17): K 4.4, creatinine 1.05 Labs (10/17): digoxin 0.5, K 4.1, creatinine 1.12, HCT 44.9, digoxin 0.5 Labs (11/17): K 4 => 3.8, creatinine 1.3 => 1.16, digoxin 0.5, BNP 296 Labs (12/17): K 4.1, creatinine 1.15, BNP 294, digoxin 0.7 Labs (2/18): LDL 135, Lp(a) 124, LDL-P 979  Labs (5/18): K 4.3, creatinine 1.07 => 0.95, BNP 224, hgb 14.4, digoxin 0.6  PMH: 1. CAD: Anterior MI in 2004.  Cardiac surgery in 10/14 included LIMA-LAD.  - Lexiscan Cardiolite (9/17): EF 35%, infarct present with no ischemia.  2. Chronic mitral regurgitation: 10/14 surgery at [facility] with MV repair, Maze, LIMA-LAD, and LA appendage closure.  3. Atrial fibrillation: Paroxysmal.  He was initially on Tikosyn but had breakthrough atrial fibrillation.  H/o Maze in 2014.  Had recurrent atrial fibrillation with ablation in 3/15 by Dr Ola Spurr in [facility].  Not anticoagulated after 2 severe prostate bleeding episodes.  LA appendage was clipped with MV surgery.  - Zio patch in 12/15 with low atrial fibrillation burden (7%).  - Holter (9/16) with PACs, PVCs, short atrial fibrillation runs (nothing sustained). - Event monitor (11/17) with runs of atrial fibrillation and flutter.   4. Chronotropic incompetence.  5. HTN 6. Hyperlipidemia: Refuses statin.  7. Peripheral neuropathy 8. GERD 9. H/o BPPV 10. H/o TIA 11. Ischemic cardiomyopathy: cardiac MRI 7/14 with EF 35%, moderate MR, normal RV size and systolic function, extensive anterior and anteroseptal LGE  suggestive of non-viable myocardium (this was prior to LIMA-LAD).  Echo 1/15 with EF 20-25%, moderate AI, PA systolic pressure 39 mmHg. Echo (1/16) with EF 20-25%, diffuse hypokinesis with regionality, moderate LV dilation, moderate AI, s/p MV repair with mild MR and normal gradients, RV dilated with mildly decreased systolic function, PA systolic pressure 71 mmHg.   - RHC (2/16) with mean RA 9, PA 61/25 mean 40, mean PCWP 23, CI 2.13/PVR 4.3 (Fick), CI 1.93/PVR 4.7 (thermo).   - CPX (4/16) with peak VO2 17.9, VE/VCO2 34.7 => mildly decreased functional capacity.  - Spironolactone apparently caused cognitive deficits - Intolerant of Coreg due to development of severe alopecia - Unable to uptitrate bisoprolol due to intolerance.  - Lightheaded with Entresto.  - Unable to tolerate increase in valsartan to 80 mg bid.  - Echo (1/17) with EF 30-35%, moderate LV dilation, mild AI, s/p MV  repair with mild MR, moderately dilated RV with mildly decreased systolic function, PA systolic pressure 69 mmHg.  - CPX (4/17): peak VO2 18, VE/VCO2 slope 33, RER 1.28 => mild to moderate functional impairment, mildly improved.  - Echo (9/17): EF 20-25% with regional WMAs, normal RV size and systolic function, PASP 86 mmHg, stable repaired mitral valve with mild MR, moderate AI.  - RHC (10/17): mean RA 9, PA 70/23 mean 43, PCWP mean 20, CI 2.96 Fick/2.84 Thermo, PVR 4.3 WU.  12. Carotid stenosis: Carotid dopplers (1/16) with 40-59% bilateral ICA stenosis. Carotid dopplers (4/17) with 40-59% BICA stenosis.  - Carotid dopplers (2/18) with < 50% BICA stenosis.  13. Aortic insufficiency: Moderate by last echo 9/17.  14. Pulmonary HTN: Mixed PAH and pulmonary venous hypertension.  PFTs (9/14) were normal.  V/Q scan (2/16) with no evidence of acute or chronic PE.  6 minute walk (3/16) 381 m. 6 minute walk (5/16) 414.5 m. 6 minute walk (1/17) 469 m.  15. OSA: Moderate on 5/16 sleep study. Unable to tolerate CPAP.  Repeat sleep  study at [facility] in 2/18 was also suggestive of OSA.  16. Venous insufficiency 17. Ventral hernia 18. Bradycardia: Holter (5/18) with overnight pauses up to 4.7 sec (likely due to OSA), occasional short atrial tachycardia runs, no atrial fibrillation, avg HR 70s, 3% PVCs.   SH: Married, lives in [place], Engineer, water, nonsmoker  FH: CAD  ROS: All systems reviewed and negative except as per HPI.   Current Outpatient Prescriptions  Medication Sig Dispense Refill  . aspirin 81 MG chewable tablet Chew 81 mg by mouth daily.    . digoxin (LANOXIN) 0.125 MG tablet Take 0.5 tablets (0.0625 mg total) by mouth daily. For 2 days then stop 90 tablet 3  . eplerenone (INSPRA) 25 MG tablet TAKE 1 TABLET BY MOUTH ONCE DAILY 30 tablet 6  . famotidine (PEPCID) 20 MG tablet Take 20 mg by mouth daily as needed for heartburn or indigestion.    . Magnesium 100 MG CAPS Take 1 capsule by mouth 3 (three) times daily.     . Multiple Vitamins-Minerals (MULTIVITAMIN WITH MINERALS) tablet Take 1 tablet by mouth daily.    . Omega-3 Fatty Acids (THE VERY FINEST FISH OIL) LIQD Take 2-3 g by mouth daily.     . potassium chloride (K-DUR,KLOR-CON) 10 MEQ tablet Take 2 tablets (20 mEq total) by mouth daily. 60 tablet 3  . tadalafil, PAH, (ADCIRCA) 20 MG tablet Take 20 mg by mouth daily.    Marland Kitchen torsemide (DEMADEX) 20 MG tablet Take 40 mg by mouth daily.    . valsartan (DIOVAN) 40 MG tablet Take 40 mg by mouth 2 (two) times daily.    . Zinc 100 MG TABS Take 1 tablet by mouth daily.     Derrill Memo ON 08/30/2016] bisoprolol (ZEBETA) 5 MG tablet Take 0.5 tablets (2.5 mg total) by mouth daily. 15 tablet 3   No current facility-administered medications for this encounter.    BP (!) 143/51   Pulse 81   Wt 155 lb 4 oz (70.4 kg)   SpO2 100%   BMI 22.93 kg/m    Wt Readings from Last 3 Encounters:  08/27/16 155 lb 4 oz (70.4 kg)  08/13/16 156 lb 8 oz (71 kg)  05/28/16 159 lb 8 oz (72.3 kg)     General: NAD Neck: JVP not  elevated, no thyromegaly or thyroid nodule.  Lungs: Clear to auscultation bilaterally.  CV: Lateral PMI.  Heart regular S1/S2,  no S3/S4, 2/6 early SEM RUSB.  No edema.  No carotid bruit.  Normal pedal pulses.  Abdomen: Soft, NT, ND, no HSM. No bruits or masses. +BS.  Small ventral hernia, reducible.  Neurologic: Alert and oriented x 3.  Psych: Normal affect. Extremities: No clubbing or cyanosis.  HEENT: Normal.   Assessment/Plan: 1. CAD: S/p LIMA-LAD.  He is on ASA 81.  He has decided not to take statins after reviewing the data (I did recommend taking a statin but we have agreed to disagree on this, he is taking red yeast rice extract).  Lexiscan Cardiolite was done in 9/17 due to atypical chest pain.  This showed prior infarction with no ischemia.  No recurrence of chest pain since that time despite ongoing exercise.   2. S/p mitral valve repair: The MV repair looked stable on 9/17 echo with no evidence for significant mitral stenosis.  3. Aortic insufficiency: Moderate on 9/17 echo.  Continue to follow.  4. Chronic systolic CHF: Ischemic cardiomyopathy, EF 20-25% on 9/17 echo.  PA pressure remains elevated by doppler measurement but RV systolic function was normal.  Mildly decreased functional capacity on 4/16 CPX, this was mildly improved on 4/17 CPX.  NYHA class II.  He is not volume overloaded on exam, generally feeling well.  6 minute walk today was improved.  - Continue Lasix 60 mg bid, BMET/BNP today.  - We discussed the pros and cons of digoxin and bisoprolol in light of his bradycardia.  We decided to try him off digoxin to limit bradycardia (he will call to let me know if stopping digoxin makes him feel worse) and start him back on low dose bisoprolol 2.5 mg daily given benefit for his cardiomyopathy.  - Continue valsartan 40 mg bid.  He did not tolerate Entresto or further uptitration of valsartan.    - Continue eplerenone 25 (unable to tolerate increase).        - He has refused ICD  in the past.    5. Atrial fibrillation: s/p Maze in 10/14, then ablation in 3/15.  Prior to Maze, he was on Tikosyn but had breakthrough.  Currently, he is not anticoagulated due to history of prostate bleeding and his choice.  He did have his LA appendage clipped at time of MV surgery.  At prior appointment, he was feeling more atrial fibrillation (confirmed by event monitor).  He is symptomatic when he has atrial fibrillation (feels considerably more tired).  He is having fewer episodes of palpitations now.  - As above, plan to start back on low dose bisoprolol 2.5 daily but stop digoxin.    - We have had discussions about what to do with his atrial fibrillation.  He is symptomatic when in atrial fibrillation/fluttter.  He had breakthrough on Tikosyn in the past, but has had Maze and atrial fibrillation ablation since that time.  I suggested anticoagulation with Eliquis and re-attempting Tikosyn (cannot tell him definitively that he would be protected enough by the LA appendage clipping alone).  He talked to Dr Caryl Comes about this.  He opted to hold off on Tikosyn.  6. Pulmonary hypertension: Severe by most recent echo in 9/17 and on RHC in 10/17.  RV actually looked ok on 9/17 echo.  Mixed pulmonary venous and pulmonary arterial HTN on RHC.  It is possible that the [facility] component is due to pulmonary vascular remodeling in the setting of chronic mitral regurgitation prior to MV repair. Negative V/Q scan, no evidence for CTEPH.  PFTs normal  in 9/14.  He is seeing Dr [place] Bells.  Sleep study showed moderate OSA but he has been unable to tolerated CPAP and is now using an oral device.  - I had wanted him to increase Adcirca to 40 mg daily but he wants to stay on 20 mg daily.   - Given advantage of combination therapy, we will need to address adding an ERA in the future. I am not sure he will agree to doing this.  7. OSA: Moderate.  Has not tolerated CPAP. Probably plays a role in recurrent atrial fibrillation and  pulmonary hypertension as well as nocturnal sinus pauses.  He has been using his oral device. - I will arrange for overnight oximetry.  8. Carotid stenosis: 2/18 carotid dopplers at [facility] with < 50% bilateral stenosis.   9. Bradycardia: Reviewing old ECGs, probably ectopic atrial rhythm with low voltage Ps rather than junctional rhythm. Discussed with [name], see plan above to go off digoxin and try to get back on low dose bisoprolol.   Discussed the above issues with patient, wife, and son.    Loralie Champagne 08/27/2016

## 2016-08-27 NOTE — Patient Instructions (Signed)
Decrease Digoxin to 0.0625 mg (1/2 tab) daily FOR 2 DAYS THEN STOP COMPLETELY  Start Bisoprolol 2.5 mg (1/2 tab) daily ONCE YOU STOP DIGOXIN  Labs today  Overnight Oximetry test  Your physician recommends that you schedule a follow-up appointment in: 2 months

## 2016-08-27 NOTE — Progress Notes (Signed)
6 min walk completed, pt ambulated 1800 ft (548.6 m), at beginning O2 sats was 96% on RA HR 125, at end of test O2 sat 98% on RA, HR 125.

## 2016-08-28 LAB — HOMOCYSTEINE: Homocysteine: 15.4 umol/L — ABNORMAL HIGH (ref 0.0–15.0)

## 2016-09-03 ENCOUNTER — Ambulatory Visit (INDEPENDENT_AMBULATORY_CARE_PROVIDER_SITE_OTHER): Payer: Medicare Other | Admitting: Family Medicine

## 2016-09-03 ENCOUNTER — Encounter: Payer: Self-pay | Admitting: Family Medicine

## 2016-09-03 VITALS — BP 132/60 | HR 66 | Temp 97.9°F | Ht 69.0 in | Wt 157.8 lb

## 2016-09-03 DIAGNOSIS — R42 Dizziness and giddiness: Secondary | ICD-10-CM

## 2016-09-03 NOTE — Patient Instructions (Signed)
Will reach out about next steps- I am thinking doing a long monitor and see if we can capture these events

## 2016-09-03 NOTE — Progress Notes (Signed)
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 No chest pain or shortness of breath with episodes. No palpitations with episodes. Has some top of his head headache at beginning of episodes   Past Medical History-  Patient Active Problem List   Diagnosis Date Noted  . Episodic lightheadedness 09/03/2016    Priority: High  . Pulmonary hypertension ([place]) 05/24/2014    Priority: High  . S/P CABG x 1 02/09/2013    Priority: High  . Cardiomyopathy ([place]) 01/06/2013    Priority: High  . Chronic systolic heart failure ([place]) 09/11/2010    Priority: High  . Coronary atherosclerosis 03/07/2008    Priority: High  . ATRIAL FIBRILLATION 02/14/2007    Priority: High  . S/P aortic valve repair 02/09/2013    Priority: Medium  . S/P MVR (mitral valve repair) 02/09/2013    Priority: Medium  . OSA (obstructive sleep apnea) 07/13/2012    Priority: Medium  . PSA, INCREASED 12/17/2008    Priority: Medium  . SINUS BRADYCARDIA 08/15/2008    Priority: Medium  . Hyperlipidemia 03/07/2008    Priority: Medium  . Essential hypertension 03/07/2008    Priority: Medium  . Intractable hiccups 06/10/2015    Priority: Low  . Gross hematuria 02/07/2014    Priority: Low  . Age-related macular degeneration, dry 08/06/2013    Priority: Low  . H/O shoulder surgery 01/06/2013    Priority: Low  . Dyspnea on exertion 11/25/2012    Priority: Low  . Anxiety 01/26/2011    Priority: Low  . POSITIONAL VERTIGO 07/13/2008    Priority: Low  . GERD 03/11/2008    Priority: Low  . ALLERGIC RHINITIS 03/07/2008    Priority: Low    Medications- reviewed and updated Current Outpatient Prescriptions  Medication Sig Dispense Refill  . aspirin 81 MG chewable tablet Chew 81 mg by mouth daily.    . bisoprolol (ZEBETA) 5 MG tablet Take 0.5 tablets (2.5 mg total) by mouth daily. 15 tablet 3  . eplerenone (INSPRA)  25 MG tablet TAKE 1 TABLET BY MOUTH ONCE DAILY 30 tablet 6  . famotidine (PEPCID) 20 MG tablet Take 20 mg by mouth daily as needed for heartburn or indigestion.    . Magnesium 100 MG CAPS Take 1 capsule by mouth 3 (three) times daily.     . Multiple Vitamins-Minerals (MULTIVITAMIN WITH MINERALS) tablet Take 1 tablet by mouth daily.    . Omega-3 Fatty Acids (THE VERY FINEST FISH OIL) LIQD Take 2-3 g by mouth daily.     . potassium chloride (K-DUR,KLOR-CON) 10 MEQ tablet Take 2 tablets (20 mEq total) by mouth daily. 60 tablet 3  . tadalafil, PAH, (ADCIRCA) 20 MG tablet Take 20 mg by mouth daily.    Marland Kitchen torsemide (DEMADEX) 20 MG tablet Take 40 mg by mouth daily.    . valsartan (DIOVAN) 40 MG tablet Take 40 mg by mouth 2 (two) times daily.    . Zinc 100 MG TABS Take 1 tablet by mouth daily.      No current facility-administered medications for this visit.     Objective: BP 132/60   Pulse 66   Temp 97.9 F (36.6 C) (Oral)   Ht _0  (1.753 m)   Wt 157 lb 12.8 oz (71.6 kg)   SpO2 97%   BMI 23.30 kg/m  Gen: NAD, resting comfortably No lightheadedness with standing quickly CV:  RRR no murmurs rubs or gallops Lungs: CTAB no crackles, wheeze, rhonchi Ext: no edema Skin: warm, dry Neuro: grossly normal, moves all extremities, normal gait. Need to complete full neuro exam.   Assessment/Plan:   Episodic lightheadedness S: Patient comes in complaining of "vertigo" for the last month. When pressed on this though he does not state the room moves around him but instead that he gets a prodromal mild lightheadedness and pressure in top of head. Then "rushes in" severely and feels like he is going to fall or pass out for up to 30 seconds.  LIngering lightheadedness rest of the day. Has some days with no issues.   In fact he wore a holter monitor for 48 hours in may but states he unfortunately had no episodes while wearing the monitor. Apparently had about a week without the symptoms in total.    Cardiology was concerned about symptoms and referred him to ENT. Patient saw Dr. Janace Hoard 2 weeks ago for vertigo. Determined not to be BPV based off testing supposedly with negative dix-halpike (for some reason I cannot see the full note). When recovering- his head feels warm  Triggers- at times seems to be triggered by moving his head down slightly. Triggered once by straining on toilet with bowel movement. At other times no clear trigger at all. It has even woke him up in the middle of the night and he feels he may pass out.  A/P: 76 year old male with lightheadedness- that appears to be more presyncope to me from his description instead of true vertigo (no motion of room) - patient apparently had an episode when he came into the office today and  His Initial BP 110/42 and HR was 47. I initially considered reducing his heart failure/BP medications but blood pressure came back up by end of visit. I am more concerned potential arrhythmia causing his symptoms. I am going to order a longer holter monitor and I have asked patient to write down the times of his lightheadedness/presyncope episodes. Wonder if these could be bradycardia related. For now will continue bisoprolol (recently started instead of digoxin) by heart failure clinic. Also he denies symptoms being worse since being on bisoprolol  We discussed potentially scanning carotids (though would need severe bilateral disease- and he reports he recently had this in [place] and was stable at 50% bilaterally. He apparently had updated echocardiogram there  I strongly doubt these are recurrent TIAs or CVA related given fluctuations but we discussed if monitoring does not explain these episodes that we should consider neuroimaging with potential MRI/MRA or could also refer to neurology.   If possible would like him to check pulse and blood pressure around episodes- he states he has done so a few times and blood pressure did seem much lower.   We did  not do full neurological exam today but will plan to do that at follow up.   no change in meds today   Orders Placed This Encounter  Procedures  . Holter monitor - 72 hour    Standing Status:   Future    Standing Expiration Date:   09/04/2026   The duration of face-to-face time during this visit was greater than 30 minutes. Greater than 50% of this time was spent in counseling, explanation of diagnosis, planning of further management, and/or coordination of care including discussing patient concerns, attempting to review prior notes from ENT and cardiology as well as prior workup, discussing future steps.    Return precautions advised.  Garret Reddish, MD

## 2016-09-03 NOTE — Assessment & Plan Note (Signed)
S: Patient comes in complaining of "vertigo" for the last month. When pressed on this though he does not state the room moves around him but instead that he gets a prodromal mild lightheadedness and pressure in top of head. Then "rushes in" severely and feels like he is going to fall or pass out for up to 30 seconds.  LIngering lightheadedness rest of the day. Has some days with no issues.   In fact he wore a holter monitor for 48 hours in may but states he unfortunately had no episodes while wearing the monitor. Apparently had about a week without the symptoms in total.   Cardiology was concerned about symptoms and referred him to ENT. Patient saw Dr. Janace Hoard 2 weeks ago for vertigo. Determined not to be BPV based off testing supposedly with negative dix-halpike (for some reason I cannot see the full note). When recovering- his head feels warm  Triggers- at times seems to be triggered by moving his head down slightly. Triggered once by straining on toilet with bowel movement. At other times no clear trigger at all. It has even woke him up in the middle of the night and he feels he may pass out.  A/P: 76 year old male with lightheadedness- that appears to be more presyncope to me from his description instead of true vertigo (no motion of room) - patient apparently had an episode when he came into the office today and  His Initial BP 110/42 and HR was 47. I initially considered reducing his heart failure/BP medications but blood pressure came back up by end of visit. I am more concerned potential arrhythmia causing his symptoms. I am going to order a longer holter monitor and I have asked patient to write down the times of his lightheadedness/presyncope episodes. Wonder if these could be bradycardia related. For now will continue bisoprolol (recently started instead of digoxin) by heart failure clinic. Also he denies symptoms being worse since being on bisoprolol  We discussed potentially scanning carotids  (though would need severe bilateral disease- and he reports he recently had this in [place] and was stable at 50% bilaterally. He apparently had updated echocardiogram there  I strongly doubt these are recurrent TIAs or CVA related given fluctuations but we discussed if monitoring does not explain these episodes that we should consider neuroimaging with potential MRI/MRA or could also refer to neurology.   If possible would like him to check pulse and blood pressure around episodes- he states he has done so a few times and blood pressure did seem much lower.   We did not do full neurological exam today but will plan to do that at follow up.

## 2016-09-04 ENCOUNTER — Encounter: Payer: Self-pay | Admitting: Family Medicine

## 2016-09-04 ENCOUNTER — Emergency Department (HOSPITAL_COMMUNITY)
Admission: EM | Admit: 2016-09-04 | Discharge: 2016-09-04 | Discharge status: Against Medical Advice | Payer: Medicare Other | Attending: Emergency Medicine | Admitting: Emergency Medicine

## 2016-09-04 ENCOUNTER — Telehealth: Payer: Self-pay | Admitting: Physician Assistant

## 2016-09-04 ENCOUNTER — Encounter (HOSPITAL_COMMUNITY): Payer: Self-pay | Admitting: *Deleted

## 2016-09-04 DIAGNOSIS — R42 Dizziness and giddiness: Secondary | ICD-10-CM | POA: Diagnosis not present

## 2016-09-04 DIAGNOSIS — Z5321 Procedure and treatment not carried out due to patient leaving prior to being seen by health care provider: Secondary | ICD-10-CM | POA: Diagnosis not present

## 2016-09-04 DIAGNOSIS — R110 Nausea: Secondary | ICD-10-CM | POA: Insufficient documentation

## 2016-09-04 LAB — URINALYSIS, ROUTINE W REFLEX MICROSCOPIC
BILIRUBIN URINE: NEGATIVE
Bacteria, UA: NONE SEEN
Glucose, UA: NEGATIVE mg/dL
HGB URINE DIPSTICK: NEGATIVE
Ketones, ur: NEGATIVE mg/dL
NITRITE: NEGATIVE
Protein, ur: NEGATIVE mg/dL
Specific Gravity, Urine: 1.008 (ref 1.005–1.030)
Squamous Epithelial / LPF: NONE SEEN
pH: 6 (ref 5.0–8.0)

## 2016-09-04 LAB — BASIC METABOLIC PANEL
ANION GAP: 11 (ref 5–15)
BUN: 25 mg/dL — ABNORMAL HIGH (ref 6–20)
CALCIUM: 9.7 mg/dL (ref 8.9–10.3)
CO2: 28 mmol/L (ref 22–32)
Chloride: 99 mmol/L — ABNORMAL LOW (ref 101–111)
Creatinine, Ser: 1.08 mg/dL (ref 0.61–1.24)
GFR calc non Af Amer: >60 mL/min (ref >60)
Glucose, Bld: 123 mg/dL — ABNORMAL HIGH (ref 65–99)
Potassium: 4.4 mmol/L (ref 3.5–5.1)
Sodium: 138 mmol/L (ref 135–145)

## 2016-09-04 LAB — I-STAT TROPONIN, ED: TROPONIN I, POC: 0.03 ng/mL (ref 0.00–0.08)

## 2016-09-04 LAB — CBC
HCT: 42.7 % (ref 39.0–52.0)
HEMOGLOBIN: 14 g/dL (ref 13.0–17.0)
MCH: 30.6 pg (ref 26.0–34.0)
MCHC: 32.8 g/dL (ref 30.0–36.0)
MCV: 93.2 fL (ref 78.0–100.0)
Platelets: 164 10*3/uL (ref 150–400)
RBC: 4.58 MIL/uL (ref 4.22–5.81)
RDW: 14.3 % (ref 11.5–15.5)
WBC: 5.7 10*3/uL (ref 4.0–10.5)

## 2016-09-04 NOTE — Addendum Note (Signed)
Addended by: Marin Olp on: 09/04/2016 06:04 PM   Modules accepted: Orders

## 2016-09-04 NOTE — Telephone Encounter (Signed)
The patient called the answering service after-hours today. Has been followed by ENT and PCP recently for vertigo, unclear etiology recently. He has extremely complex PMH, well-outlined in Dr. Claris Gladden office note - h/o atrial fib, prior ablations, bradycardia, pauses. Per notes, event monitoring several weeks ago showed HR down to 22 at one point with pauses (up to 4.7 seconds, mostly nocturnal) felt due to sleep apnea, as well as occasional SVT/atrial tach. He was transitioned off digoxin and onto bisoprolol.  He has continued to have episodes of feeling dizziness "wash over him suddenly", almost to the point of near-syncope today. He has been following his vitals multiple times throughout the day.  On multiple occasions HR has shown in the 40s, but now towards the evening he is occasionally getting readings down to 36. Most recent reading came up to 51 but on one other occasion he saw it in the 73s as well. BPs have been stable. He is not acutely symptomatic at the moment but does admit he continues to have these episodes where the dizziness washes over him. No full syncope thus far. No CP or SOB. Given his complex hx and now what I suspect to be symptomatic bradycardia, I have recommended he proceed to the ER urgently for evaluation and monitoring. With his history of CHF, prior atrial fib, and PSVT it does not seem so simple as just stopping the very low dose beta blocker. He may require PPM implantation to be able to utilize these agents. We had a long discussion as the patient's wife wanted to know if it could wait until Monday. At this point with HR episodically dropping to the 30s and episodically symptomatic, I told him I did not feel that was safe and in fact would recommend EMS if he was continuing to feel symptoms. I discussed serious risks if he chooses not to come. He plans to discuss this further with his wife (stating he is fairly sure he will come to the ER) and knows not to drive himself. He  verbalized gratitude for call.  Tycho Cheramie PA-C

## 2016-09-04 NOTE — ED Triage Notes (Signed)
Pt states that for approx 4 weeks he has been having extreme dizziness and nausea that he is being followed by ENT and cards. Pt states he has been keeping a journal of his heart rate and was at 32 earlier and [name] strongly suggested he come to the ED for further evaluation of his irregular heart rate and fluctuations.

## 2016-09-04 NOTE — ED Notes (Signed)
Patient at the desk.  States I spoke with my cardiologist and he changed some of my meds.  States I feel safe leaving.  Encouraged to return as needed.

## 2016-09-06 NOTE — Telephone Encounter (Signed)
Please call and make sure that he stops bisoprolol.

## 2016-09-07 NOTE — Addendum Note (Signed)
Addended by: Kennieth Rad on: 09/07/2016 04:08 PM   Modules accepted: Orders

## 2016-09-07 NOTE — Telephone Encounter (Signed)
Called and spoke with patient he has stopped his bisoprolol. I have discontinued it off his medication list.

## 2016-09-08 ENCOUNTER — Other Ambulatory Visit (HOSPITAL_COMMUNITY): Payer: Self-pay | Admitting: Cardiology

## 2016-09-08 ENCOUNTER — Other Ambulatory Visit (HOSPITAL_COMMUNITY): Payer: Self-pay | Admitting: Internal Medicine

## 2016-09-08 DIAGNOSIS — G4733 Obstructive sleep apnea (adult) (pediatric): Secondary | ICD-10-CM | POA: Diagnosis not present

## 2016-09-08 MED FILL — VALSARTAN 40 MG TABLET: 40 | 90 days supply | Qty: 180 | Fill #0

## 2016-09-09 ENCOUNTER — Encounter: Payer: Self-pay | Admitting: Cardiology

## 2016-09-09 ENCOUNTER — Other Ambulatory Visit (HOSPITAL_COMMUNITY): Payer: Self-pay | Admitting: *Deleted

## 2016-09-09 DIAGNOSIS — G4733 Obstructive sleep apnea (adult) (pediatric): Secondary | ICD-10-CM | POA: Diagnosis not present

## 2016-09-09 DIAGNOSIS — R42 Dizziness and giddiness: Secondary | ICD-10-CM

## 2016-09-09 MED FILL — TORSEMIDE 20 MG TABLET: 20 | 30 days supply | Qty: 120 | Fill #0

## 2016-09-09 NOTE — Telephone Encounter (Signed)
Is he supposed to be on torsemide or furosemide?

## 2016-09-09 NOTE — Progress Notes (Signed)
Order for 2 week event monitor placed per pt's pcp Dr Yong Channel

## 2016-09-15 ENCOUNTER — Ambulatory Visit (INDEPENDENT_AMBULATORY_CARE_PROVIDER_SITE_OTHER): Payer: Medicare Other

## 2016-09-15 DIAGNOSIS — R42 Dizziness and giddiness: Secondary | ICD-10-CM | POA: Diagnosis not present

## 2016-09-16 ENCOUNTER — Telehealth: Payer: Self-pay | Admitting: Cardiology

## 2016-09-16 ENCOUNTER — Telehealth: Payer: Self-pay | Admitting: *Deleted

## 2016-09-16 NOTE — Telephone Encounter (Signed)
Called by monitor company pt had 3 second pause while sleeping- no symptoms  Cashel Bellina PA-C 09/16/2016 7:24 AM

## 2016-09-16 NOTE — Telephone Encounter (Signed)
We received monitor tracing from the monitor company on pt for 6/26 at 2:32 AM Atrial Fibrillation wide complex while asleep. Pt  Was asymptomatic . Pt has a history of Atrial fib.  On 6/27   we received another episode of Sinus Bradycardia with 3 seconds pause while pt asleep no symptoms. Called was send to Kerin Ransom PA-C at 6/27 18 at 7:22   With no recommendations.

## 2016-09-16 NOTE — Telephone Encounter (Signed)
Pt called our office to inquire about monitor, he states he received a phone call this AM from the company just telling him to call our office.  Advised pt 3 sec pause last night, he states he was sleeping and felt nothing unusual during the night.  He states he has not felt very good today however, he reports dizziness off/on and states he hit the button on his monitor around 12-1pm because he was feeling bad.  He states he is feeling better at this time.  Will send info to Dr Aundra Dubin for his review, will f/u w/monitor clinic to see if any further strips came in today.

## 2016-09-17 ENCOUNTER — Ambulatory Visit: Payer: Medicare Other | Admitting: Family Medicine

## 2016-09-17 ENCOUNTER — Telehealth (HOSPITAL_COMMUNITY): Payer: Self-pay | Admitting: *Deleted

## 2016-09-17 NOTE — Telephone Encounter (Signed)
Patient called regarding his monitor asking if [name] can change the parameters so they are not calling him in the middle of the night for pauses or for afib as he is already aware of these issues.   I spoke with [name] and he changed parameters to where they will not call patient but only fax for pauses longer than 4 seconds.   I called [employer] and faxed over the new parameters with Dr. Claris Gladden signature.  Original order will be scanned to patient's electronic medical record.

## 2016-09-18 MED FILL — EPLERENONE 25 MG TABLET: 25 | 30 days supply | Qty: 30 | Fill #1

## 2016-09-24 ENCOUNTER — Telehealth (HOSPITAL_COMMUNITY): Payer: Self-pay | Admitting: *Deleted

## 2016-09-24 ENCOUNTER — Encounter: Payer: Self-pay | Admitting: Cardiology

## 2016-09-24 DIAGNOSIS — R0902 Hypoxemia: Secondary | ICD-10-CM

## 2016-09-24 NOTE — Telephone Encounter (Signed)
Pt had a 5.6 sec pause at 2am this am per his event monitor.  Per Dr Aundra Dubin this is due to his untreated sleep apnea as pt refuses to wear cpap.  Pt had ONO that showed low oxygen level and Dr Aundra Dubin orders 2L oxygen at night via [place] if pr refuses cpap.    Pt aware and agreeable, he again states he can not wear cpap, but is agreeable to home oxygen, order sent to [facility]

## 2016-10-09 ENCOUNTER — Telehealth (HOSPITAL_COMMUNITY): Payer: Self-pay

## 2016-10-09 NOTE — Telephone Encounter (Signed)
Patient calling to follow up on Diovan recall. Advised patient to call pharmacy that dispensed his Rx to see if his particular medication was recalled. Patient aware and agreeable, no other questions or concerns.  Renee Pain, RN

## 2016-10-13 ENCOUNTER — Other Ambulatory Visit: Payer: Self-pay | Admitting: Internal Medicine

## 2016-10-13 MED FILL — EPLERENONE 25 MG TABLET: 25 | 30 days supply | Qty: 30 | Fill #2

## 2016-10-13 MED FILL — ADCIRCA 20 MG TABLET: 20 | 30 days supply | Qty: 30 | Fill #0

## 2016-10-19 ENCOUNTER — Ambulatory Visit (HOSPITAL_COMMUNITY)
Admission: RE | Admit: 2016-10-19 | Discharge: 2016-10-19 | Disposition: A | Discharge status: Home / Self Care | Payer: Medicare Other | Source: Ambulatory Visit | Attending: Internal Medicine | Admitting: Internal Medicine

## 2016-10-19 DIAGNOSIS — I5022 Chronic systolic (congestive) heart failure: Secondary | ICD-10-CM

## 2016-10-19 DIAGNOSIS — I10 Essential (primary) hypertension: Secondary | ICD-10-CM | POA: Diagnosis not present

## 2016-10-22 ENCOUNTER — Telehealth (HOSPITAL_COMMUNITY): Payer: Self-pay

## 2016-10-22 NOTE — Telephone Encounter (Signed)
Cardiac event monitor  Order: [id number]  Status:  Final result Visible to patient:  Yes (MyChart) Dx:  Lightheadedness  Notes recorded by Effie Berkshire, RN on 10/22/2016 at 1:47 PM EDT Patient's wife made aware of results. ------  Notes recorded by Larey Dresser, MD on 10/18/2016 at 10:49 PM EDT Mostly NSR. Rare atrial fibrillation. 3-5 second pauses, but these appear to have all been while sleeping. He has untreated OSA. Would address with him again about trying CPAP.

## 2016-11-03 MED FILL — TORSEMIDE 20 MG TABLET: 20 | 30 days supply | Qty: 120 | Fill #1

## 2016-11-03 MED FILL — POTASSIUM CL 10 MEQ TAB SA: 10 | 30 days supply | Qty: 60 | Fill #3

## 2016-11-05 MED FILL — ADCIRCA 20 MG TABLET: 20 | 30 days supply | Qty: 30 | Fill #1

## 2016-11-09 ENCOUNTER — Ambulatory Visit (HOSPITAL_COMMUNITY)
Admission: RE | Admit: 2016-11-09 | Discharge: 2016-11-09 | Disposition: A | Discharge status: Home / Self Care | Payer: Medicare Other | Source: Ambulatory Visit | Attending: Cardiology | Admitting: Cardiology

## 2016-11-09 ENCOUNTER — Encounter (HOSPITAL_COMMUNITY): Payer: Self-pay | Admitting: Cardiology

## 2016-11-09 VITALS — BP 143/60 | HR 87 | Wt 157.2 lb

## 2016-11-09 DIAGNOSIS — I272 Pulmonary hypertension, unspecified: Secondary | ICD-10-CM

## 2016-11-09 DIAGNOSIS — I480 Paroxysmal atrial fibrillation: Secondary | ICD-10-CM | POA: Insufficient documentation

## 2016-11-09 DIAGNOSIS — I5022 Chronic systolic (congestive) heart failure: Secondary | ICD-10-CM

## 2016-11-09 DIAGNOSIS — G629 Polyneuropathy, unspecified: Secondary | ICD-10-CM | POA: Diagnosis not present

## 2016-11-09 DIAGNOSIS — Z79899 Other long term (current) drug therapy: Secondary | ICD-10-CM | POA: Diagnosis not present

## 2016-11-09 DIAGNOSIS — I6523 Occlusion and stenosis of bilateral carotid arteries: Secondary | ICD-10-CM | POA: Diagnosis not present

## 2016-11-09 DIAGNOSIS — I255 Ischemic cardiomyopathy: Secondary | ICD-10-CM | POA: Insufficient documentation

## 2016-11-09 DIAGNOSIS — G4733 Obstructive sleep apnea (adult) (pediatric): Secondary | ICD-10-CM | POA: Insufficient documentation

## 2016-11-09 DIAGNOSIS — I4891 Unspecified atrial fibrillation: Secondary | ICD-10-CM | POA: Diagnosis not present

## 2016-11-09 DIAGNOSIS — R001 Bradycardia, unspecified: Secondary | ICD-10-CM | POA: Diagnosis not present

## 2016-11-09 DIAGNOSIS — I2510 Atherosclerotic heart disease of native coronary artery without angina pectoris: Secondary | ICD-10-CM | POA: Diagnosis not present

## 2016-11-09 DIAGNOSIS — I340 Nonrheumatic mitral (valve) insufficiency: Secondary | ICD-10-CM | POA: Diagnosis not present

## 2016-11-09 DIAGNOSIS — K219 Gastro-esophageal reflux disease without esophagitis: Secondary | ICD-10-CM | POA: Insufficient documentation

## 2016-11-09 DIAGNOSIS — E785 Hyperlipidemia, unspecified: Secondary | ICD-10-CM | POA: Diagnosis not present

## 2016-11-09 DIAGNOSIS — I872 Venous insufficiency (chronic) (peripheral): Secondary | ICD-10-CM | POA: Diagnosis not present

## 2016-11-09 DIAGNOSIS — I10 Essential (primary) hypertension: Secondary | ICD-10-CM | POA: Diagnosis not present

## 2016-11-09 DIAGNOSIS — I351 Nonrheumatic aortic (valve) insufficiency: Secondary | ICD-10-CM | POA: Insufficient documentation

## 2016-11-09 LAB — BASIC METABOLIC PANEL
ANION GAP: 7 (ref 5–15)
BUN: 19 mg/dL (ref 6–20)
CHLORIDE: 100 mmol/L — AB (ref 101–111)
CO2: 30 mmol/L (ref 22–32)
Calcium: 9.2 mg/dL (ref 8.9–10.3)
Creatinine, Ser: 1.03 mg/dL (ref 0.61–1.24)
GFR calc Af Amer: >60 mL/min (ref >60)
Glucose, Bld: 114 mg/dL — ABNORMAL HIGH (ref 65–99)
POTASSIUM: 3.8 mmol/L (ref 3.5–5.1)
Sodium: 137 mmol/L (ref 135–145)

## 2016-11-09 LAB — BRAIN NATRIURETIC PEPTIDE: B Natriuretic Peptide: 233 pg/mL — ABNORMAL HIGH (ref 0.0–100.0)

## 2016-11-09 MED ORDER — VALSARTAN 40 MG PO TABS: 60.0000 mg | ORAL_TABLET | Freq: Two times a day (BID) | ORAL | 1 refills | Status: DC

## 2016-11-09 MED FILL — VALSARTAN 40 MG TABLET: 40 | 90 days supply | Qty: 270 | Fill #0

## 2016-11-09 NOTE — Progress Notes (Signed)
SATURATION QUALIFICATIONS: (This note is used to comply with regulatory documentation for home oxygen)  Patient Saturations on Room Air at Rest = 99%  Patient Saturations on Room Air while Ambulating = 98%  Patient Saturations on 0 Liters of oxygen while Ambulating = 98%  Please briefly explain why patient needs home oxygen: Patient reportedly needed Oxygen at home due to pauses at night with heart monitor.  [name] recommedned CPAP however patient refused, and would prefer home O2 to wear at night instead.  Patient's ambulating sats remained at 98% with ambulation, patient talked throughout the walk and did not require any rest breaks.  [name] made aware.  Renee Pain, RN

## 2016-11-09 NOTE — Progress Notes (Signed)
Patient ID: Caleb Moment, PhD, male   DOB: 1940-04-01, 76 y.o.   MRN: [id number]    Advanced Heart Failure Clinic Note   PCP: [name] EP: [name] Cardiology: [name]  76 yo with complex past history presents for heart failure followup.  Patient had anterior MI in 2004 and developed ischemic cardiomyopathy as well as mitral regurgitation. He also developed atrial fibrillation.  In 10/14, he had MV repair, Maze, CABG with LIMA-LAD, and LA appendage closure at [facility] in [place].  Subsequently, atrial fibrillation returned and he had an atrial fibrillation ablation in [facility] in 3/15.  He wore a Zio patch in 12/15 and had a low atrial fibrillation burden of 7%.  He has had a long-standing ischemic cardiomyopathy.  In 2015, EF was 20-25%.  Echo in 2016 also showed EF 25-30% but estimated PA pressure suggested severe pulmonary hypertension.     At initial appointment, he reported increased exertional dyspnea over a number of weeks.  RHC was done, showing mildly elevated PCWP with moderate pulmonary HTN and low cardiac index (1.93 thermo, 2.13 Fick).  V/Q scan showed no PE.  I started him on digoxin and have titrated his Lasix to 80 mg daily.  He is now on bisoprolol and able to tolerate it.  At last appointment, I had him try replacing valsartan with Entresto 24/26 bid.  He was unable to tolerate it (made him dizzy, though BP was not low).  Therefore, I had him restart valsartan.  CPX in 4/16 showed mildly decreased functional capacity.  He has tolerated Adcirca 20 mg daily.  He did not tolerate an attempt to uptitrate bisoprolol.  He tried CPAP but was unable to tolerate it.  He was unable to tolerate eplerenone 50 mg daily.  Last echo in 9/17 showed EF 20-25%, stable MV repair, normal RV, and PA systolic pressure 86 mmHg.   He had a Lexiscan Cardiolite in 9/17 that showed infarction, no ischemia.  [place] in 10/17 showed severe mixed pulmonary venous HTN/pulmonary arterial HTN with PVR 4.3  WU and PCWP mildly elevated.  Adcirca was increased to 40 mg daily and Lasix was increased to 80 qam/40 qpm.  I switched him over to torsemide because of increased weight.  However, he started back on Lasix as he did not think torsemide was as effective.   At a prior appointment, he was having more palpitations.  Event monitor in 11/17 showed atrial fibrillation episodes, these were symptomatic.  Since then, the atrial fibrillation seems to have decreased.  He is in NSR today.  He saw [name] to discuss Tikosyn initiation.  No decision was reached at that time, and since palpitations have decreased again, he wants to hold off on Tikosyn for now.   He has OSA.  He cannot tolerate CPAP but is using his oral appliance.    HR was lower in 5/18.  He came into the office with HR around 40.  ECG showed an ectopic atrial rhythm with very small P waves. I stopped bisoprolol and later digoxin.  HR has gone up since.  He wore an event monitor in 6/18 showing rare 3-5 second pauses at night only, rare atrial fibrillation, and rare junctional bradycardia.    He is feeling good today.  Rare palpitations.  No dyspnea walking on flat ground but mild shortness or breath walking up a hill.  No chest pain.  No lightheadedness.  No orthopnea/PND.  Weight stable.   ECG (personally reviewed): NSR,  1st degree AV block with PVC, IVCD with QRS 130 msec  6 minute walk (3/16): 381 m  6 minute walk (5/16): 414.5 m 6 minute walk (10/16): 562 m 6 minute walk (1/17): 469 m 6 minute walk (8/17): 488 m 6 minute walk (6/18): 549 m  Labs (2/15): LDL 144 Labs (8/15): K 4.6, creatinine 0.9 Labs (12/15): HCT 42.3   Labs (2/16): K 4 => 4.2, creatinine 1.05 => 0.92, BNP 268 Labs (3/16): BNP 495 => 320, digoxin 0.4, RF 14.7 (very mild increase), TSH normal, HIV negative, anti-SCL70 negative, creatinine 0.91, K 4.4 Labs (7/16): K 5, creatinine 1.05, BNP 455, vitamin D normal, digoxin 0.4, B12 normal Labs (8/16): digoxin 0.8, BNP  305, K 4.2, creatinine 0.99 Labs (9/16): HCT 43.3, TSH normal, BNP 492 => 278, K 4.3, creatinine 0.97 => 1.04, digoxin 0.6, TSH normal, LDL 154, LDL-P 1654.  Labs (10/16): K 4.7, creatinine 1.1 Labs (1/17): pro-BNP 2065, digoxin 0.3, K 4.3, creatinine 1.10 => 1.28 Labs (3/17): K 4.1, creatinine 1.09, HCT 38.3 Labs (4/17): K 4.4, creatinine 0.99, digoxin 0.8 Labs (5/17): K 4.3, creatinine 1.08, BNP 317, hgb 13.1 Labs (8/17): K 4.6, creatinine 0.99, BNP 392, digoxin 0.5 Labs (9/17): K 4.4, creatinine 1.05 Labs (10/17): digoxin 0.5, K 4.1, creatinine 1.12, HCT 44.9, digoxin 0.5 Labs (11/17): K 4 => 3.8, creatinine 1.3 => 1.16, digoxin 0.5, BNP 296 Labs (12/17): K 4.1, creatinine 1.15, BNP 294, digoxin 0.7 Labs (2/18): LDL 135, Lp(a) 124, LDL-P 979  Labs (5/18): K 4.3, creatinine 1.07 => 0.95, BNP 224, hgb 14.4, digoxin 0.6 Labs (6/18): K 4.4, creatinine 1.08, hgb 14  PMH: 1. CAD: Anterior MI in 2004.  Cardiac surgery in 10/14 included LIMA-LAD.  - Lexiscan Cardiolite (9/17): EF 35%, infarct present with no ischemia.  2. Chronic mitral regurgitation: 10/14 surgery at [facility] with MV repair, Maze, LIMA-LAD, and LA appendage closure.  3. Atrial fibrillation: Paroxysmal.  He was initially on Tikosyn but had breakthrough atrial fibrillation.  H/o Maze in 2014.  Had recurrent atrial fibrillation with ablation in 3/15 by Dr Ola Spurr in [facility].  Not anticoagulated after 2 severe prostate bleeding episodes.  LA appendage was clipped with MV surgery.  - Zio patch in 12/15 with low atrial fibrillation burden (7%).  - Holter (9/16) with PACs, PVCs, short atrial fibrillation runs (nothing sustained). - Event monitor (11/17) with runs of atrial fibrillation and flutter.   - Event monitor (6/18) with rare atrial fibrillation 4. Chronotropic incompetence.  5. HTN 6. Hyperlipidemia: Refuses statin.  7. Peripheral neuropathy 8. GERD 9. H/o BPPV 10. H/o TIA 11. Ischemic cardiomyopathy: cardiac  MRI 7/14 with EF 35%, moderate MR, normal RV size and systolic function, extensive anterior and anteroseptal LGE suggestive of non-viable myocardium (this was prior to LIMA-LAD).  Echo 1/15 with EF 20-25%, moderate AI, PA systolic pressure 39 mmHg. Echo (1/16) with EF 20-25%, diffuse hypokinesis with regionality, moderate LV dilation, moderate AI, s/p MV repair with mild MR and normal gradients, RV dilated with mildly decreased systolic function, PA systolic pressure 71 mmHg.   - RHC (2/16) with mean RA 9, PA 61/25 mean 40, mean PCWP 23, CI 2.13/PVR 4.3 (Fick), CI 1.93/PVR 4.7 (thermo).   - CPX (4/16) with peak VO2 17.9, VE/VCO2 34.7 => mildly decreased functional capacity.  - Spironolactone apparently caused cognitive deficits - Intolerant of Coreg due to development of severe alopecia - Unable to uptitrate bisoprolol due to intolerance.  - Lightheaded with Entresto.  - Unable to tolerate  increase in valsartan to 80 mg bid.  - Echo (1/17) with EF 30-35%, moderate LV dilation, mild AI, s/p MV repair with mild MR, moderately dilated RV with mildly decreased systolic function, PA systolic pressure 69 mmHg.  - CPX (4/17): peak VO2 18, VE/VCO2 slope 33, RER 1.28 => mild to moderate functional impairment, mildly improved.  - Echo (9/17): EF 20-25% with regional WMAs, normal RV size and systolic function, PASP 86 mmHg, stable repaired mitral valve with mild MR, moderate AI.  - RHC (10/17): mean RA 9, PA 70/23 mean 43, PCWP mean 20, CI 2.96 Fick/2.84 Thermo, PVR 4.3 WU.  12. Carotid stenosis: Carotid dopplers (1/16) with 40-59% bilateral ICA stenosis. Carotid dopplers (4/17) with 40-59% BICA stenosis.  - Carotid dopplers (2/18) with < 50% BICA stenosis.  13. Aortic insufficiency: Moderate by last echo 9/17.  14. Pulmonary HTN: Mixed PAH and pulmonary venous hypertension.  PFTs (9/14) were normal.  V/Q scan (2/16) with no evidence of acute or chronic PE.  6 minute walk (3/16) 381 m. 6 minute walk (5/16) 414.5  m. 6 minute walk (1/17) 469 m.  15. OSA: Moderate on 5/16 sleep study. Unable to tolerate CPAP.  Repeat sleep study at [facility] in 2/18 was also suggestive of OSA.  16. Venous insufficiency 17. Ventral hernia 18. Bradycardia: Holter (5/18) with overnight pauses up to 4.7 sec (likely due to OSA), occasional short atrial tachycardia runs, no atrial fibrillation, avg HR 70s, 3% PVCs.  - Event monitor (6/18): Rare 3-4 second pauses at night, rare atrial fibrillation, rare runs of junctional bradycardia.   SH: Married, lives in [place], Engineer, water, nonsmoker  FH: CAD  ROS: All systems reviewed and negative except as per HPI.   Current Outpatient Prescriptions  Medication Sig Dispense Refill  . aspirin 81 MG chewable tablet Chew 81 mg by mouth daily.    Marland Kitchen eplerenone (INSPRA) 25 MG tablet TAKE 1 TABLET BY MOUTH ONCE DAILY 30 tablet 6  . famotidine (PEPCID) 20 MG tablet Take 20 mg by mouth daily as needed for heartburn or indigestion.    . Magnesium 100 MG CAPS Take 1 capsule by mouth 3 (three) times daily.     . Multiple Vitamins-Minerals (MULTIVITAMIN WITH MINERALS) tablet Take 1 tablet by mouth daily.    . Omega-3 Fatty Acids (THE VERY FINEST FISH OIL) LIQD Take 2-3 g by mouth daily.     . potassium chloride (K-DUR,KLOR-CON) 10 MEQ tablet Take 2 tablets (20 mEq total) by mouth daily. 60 tablet 3  . tadalafil, PAH, (ADCIRCA) 20 MG tablet Take 1 tablet (20 mg total) by mouth daily. 30 tablet 3  . torsemide (DEMADEX) 20 MG tablet Take 40 mg by mouth daily.    . valsartan (DIOVAN) 40 MG tablet Take 1.5 tablets (60 mg total) by mouth 2 (two) times daily. 270 tablet 1  . Zinc 100 MG TABS Take 1 tablet by mouth daily.      No current facility-administered medications for this encounter.    BP (!) 143/60   Pulse 87   Wt 157 lb 4 oz (71.3 kg)   SpO2 99%   BMI 22.56 kg/m    Wt Readings from Last 3 Encounters:  11/09/16 157 lb 4 oz (71.3 kg)  09/04/16 155 lb (70.3 kg)  09/03/16 157 lb  12.8 oz (71.6 kg)    General: NAD Neck: No JVD, no thyromegaly or thyroid nodule.  Lungs: Clear to auscultation bilaterally with normal respiratory effort. CV: Nondisplaced PMI.  Heart regular  S1/S2, no S3/S4, 2/6 early SEM RUSB.  No peripheral edema.  No carotid bruit.  Normal pedal pulses.  Abdomen: Soft, nontender, no hepatosplenomegaly, no distention.  Small ventral hernia, easily reduced.  Skin: Intact without lesions or rashes.  Neurologic: Alert and oriented x 3.  Psych: Normal affect. Extremities: No clubbing or cyanosis.  HEENT: Normal.   Assessment/Plan: 1. CAD: S/p LIMA-LAD.  He is on ASA 81.  He has decided not to take statins after reviewing the data (I did recommend taking a statin but we have agreed to disagree on this, he is taking red yeast rice extract).  Lexiscan Cardiolite was done in 9/17 due to atypical chest pain.  This showed prior infarction with no ischemia.  No recurrence of chest pain since that time despite ongoing exercise.   2. S/p mitral valve repair: The MV repair looked stable on 9/17 echo with no evidence for significant mitral stenosis.  3. Aortic insufficiency: Moderate on 9/17 echo.  Will repeat echo in 9/18.  4. Chronic systolic CHF: Ischemic cardiomyopathy, EF 20-25% on 9/17 echo.  PA pressure remained elevated by doppler measurement but RV systolic function was normal.  Mildly decreased functional capacity on 4/16 CPX, this was mildly improved on 4/17 CPX.  NYHA class II.  He is not volume overloaded on exam, generally feeling well.    - Continue torsemide 40 mg daily, BMET/BNP today.  - Bisoprolol and digoxin were both stopped with bradycardia and lightheadedness.  He no longer has lightheaded spells and HR in 80s today.  - He did not tolerate Entresto or further uptitration of valsartan in the past.  Now that he is no longer on bisoprolol and BP is running higher, I will have him increase valsartan to 60 mg bid. BMET in 10 days.    - Continue eplerenone  25 (unable to tolerate increase).        - He has refused ICD in the past.    - Repeat echo in 9/18.  5. Atrial fibrillation: s/p Maze in 10/14, then ablation in 3/15.  Prior to Maze, he was on Tikosyn but had breakthrough.  Currently, he is not anticoagulated due to history of prostate bleeding and his choice.  He did have his LA appendage clipped at time of MV surgery.  At prior appointment, he was feeling more atrial fibrillation (confirmed by event monitor).  He is symptomatic when he has atrial fibrillation (feels considerably more tired).  He is having fewer episodes of palpitations now.  - Off nodal blockers with bradycardia.    - We have had discussions about what to do with his atrial fibrillation.  He is symptomatic when in atrial fibrillation/fluttter.  He had breakthrough on Tikosyn in the past, but has had Maze and atrial fibrillation ablation since that time.  I suggested anticoagulation with Eliquis and re-attempting Tikosyn (cannot tell him definitively that he would be protected enough by the LA appendage clipping alone).  He talked to Dr Caryl Comes about this.  He opted to hold off on Tikosyn.  6. Pulmonary hypertension: Severe by most recent echo in 9/17 and on RHC in 10/17.  RV actually looked ok on 9/17 echo.  Mixed pulmonary venous and pulmonary arterial HTN on RHC.  It is possible that the [facility] component is due to pulmonary vascular remodeling in the setting of chronic mitral regurgitation prior to MV repair. Negative V/Q scan, no evidence for CTEPH.  PFTs normal in 9/14.  He is seeing Dr Lake Bells.  Sleep  study showed moderate OSA but he has been unable to tolerated CPAP and is now using an oral device.  - I had wanted him to increase Adcirca to 40 mg daily but he wants to stay on 20 mg daily.   - Given advantage of combination therapy, I talked to him about adding an ERA.  He so far has not wanted to do this.  7. OSA: Moderate.  Has not tolerated CPAP. Probably plays a role in recurrent  atrial fibrillation and pulmonary hypertension as well as nocturnal sinus pauses.  He has been using his oral device. 8. Carotid stenosis: 2/18 carotid dopplers at [facility] with < 50% bilateral stenosis.   9. Bradycardia: Has had junctional bradycardia and nocturnal pauses, pauses likely related to OSA.  Now off digoxin and bisoprolol, no longer having episodes of low HR.  He is in NSR today.  Continue to follow.    Followup in 3 months.   Loralie Champagne 11/09/2016

## 2016-11-09 NOTE — Patient Instructions (Signed)
Increase Valsartan to 60 mg (1 & 1/2 tabs) Twice daily   Labs today  Labs in 10 days  Your physician has requested that you have an echocardiogram. Echocardiography is a painless test that uses sound waves to create images of your heart. It provides your doctor with information about the size and shape of your heart and how well your heart's chambers and valves are working. This procedure takes approximately one hour. There are no restrictions for this procedure.  Your physician recommends that you schedule a follow-up appointment in: 3 months

## 2016-11-10 LAB — HOMOCYSTEINE: Homocysteine: 12 umol/L (ref 0.0–15.0)

## 2016-11-17 MED FILL — EPLERENONE 25 MG TABLET: 25 | 30 days supply | Qty: 30 | Fill #3

## 2016-11-19 ENCOUNTER — Other Ambulatory Visit (HOSPITAL_COMMUNITY): Payer: Medicare Other

## 2016-11-20 ENCOUNTER — Ambulatory Visit (HOSPITAL_COMMUNITY)
Admission: RE | Admit: 2016-11-20 | Discharge: 2016-11-20 | Disposition: A | Discharge status: Home / Self Care | Payer: Medicare Other | Source: Ambulatory Visit | Attending: Cardiology | Admitting: Cardiology

## 2016-11-20 DIAGNOSIS — I5022 Chronic systolic (congestive) heart failure: Secondary | ICD-10-CM | POA: Diagnosis not present

## 2016-11-20 LAB — BASIC METABOLIC PANEL
Anion gap: 9 (ref 5–15)
BUN: 18 mg/dL (ref 6–20)
CALCIUM: 9.1 mg/dL (ref 8.9–10.3)
CO2: 27 mmol/L (ref 22–32)
CREATININE: 1.05 mg/dL (ref 0.61–1.24)
Chloride: 99 mmol/L — ABNORMAL LOW (ref 101–111)
GFR calc non Af Amer: >60 mL/min (ref >60)
GLUCOSE: 126 mg/dL — AB (ref 65–99)
Potassium: 4.1 mmol/L (ref 3.5–5.1)
Sodium: 135 mmol/L (ref 135–145)

## 2016-11-27 ENCOUNTER — Other Ambulatory Visit: Payer: Self-pay

## 2016-11-27 ENCOUNTER — Ambulatory Visit (HOSPITAL_COMMUNITY): Payer: Medicare Other | Attending: Cardiology

## 2016-11-27 DIAGNOSIS — I420 Dilated cardiomyopathy: Secondary | ICD-10-CM | POA: Insufficient documentation

## 2016-11-27 DIAGNOSIS — I083 Combined rheumatic disorders of mitral, aortic and tricuspid valves: Secondary | ICD-10-CM | POA: Diagnosis not present

## 2016-11-27 DIAGNOSIS — I5030 Unspecified diastolic (congestive) heart failure: Secondary | ICD-10-CM | POA: Diagnosis not present

## 2016-11-27 DIAGNOSIS — I5022 Chronic systolic (congestive) heart failure: Secondary | ICD-10-CM

## 2016-11-27 DIAGNOSIS — I272 Pulmonary hypertension, unspecified: Secondary | ICD-10-CM | POA: Diagnosis not present

## 2016-12-08 MED FILL — ADCIRCA 20 MG TABLET: 20 | 30 days supply | Qty: 30 | Fill #2

## 2016-12-16 MED FILL — TORSEMIDE 20 MG TABS: 20 | 30 days supply | Qty: 120 | Fill #2

## 2016-12-16 MED FILL — EPLERENONE 25 MG TABLET: 25 | 30 days supply | Qty: 30 | Fill #4

## 2016-12-22 DIAGNOSIS — R338 Other retention of urine: Secondary | ICD-10-CM | POA: Diagnosis not present

## 2016-12-22 DIAGNOSIS — N138 Other obstructive and reflux uropathy: Secondary | ICD-10-CM | POA: Diagnosis not present

## 2016-12-22 DIAGNOSIS — N401 Enlarged prostate with lower urinary tract symptoms: Secondary | ICD-10-CM | POA: Diagnosis not present

## 2016-12-28 ENCOUNTER — Ambulatory Visit (INDEPENDENT_AMBULATORY_CARE_PROVIDER_SITE_OTHER): Payer: Medicare Other | Admitting: *Deleted

## 2016-12-28 DIAGNOSIS — Z23 Encounter for immunization: Secondary | ICD-10-CM | POA: Diagnosis not present

## 2017-01-01 ENCOUNTER — Encounter: Payer: Self-pay | Admitting: Family Medicine

## 2017-01-01 ENCOUNTER — Ambulatory Visit (INDEPENDENT_AMBULATORY_CARE_PROVIDER_SITE_OTHER): Payer: Medicare Other | Admitting: Family Medicine

## 2017-01-01 DIAGNOSIS — M1991 Primary osteoarthritis, unspecified site: Secondary | ICD-10-CM | POA: Diagnosis not present

## 2017-01-01 DIAGNOSIS — M1990 Unspecified osteoarthritis, unspecified site: Secondary | ICD-10-CM | POA: Insufficient documentation

## 2017-01-01 NOTE — Progress Notes (Signed)
  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 He denies any joint stiffness or pain until about 8 months ago. He related the timing of onset of symptoms to being tretaed for a UTI with doxycicline. Said he only took it for 5 days instead of the 14 that were rx. He notes joint issues started soon after that. Low back stiffness, left hop / buttock pain and stiffness in his fingers. Has some pain with making a fist, particularly with his left hand. Notes his fingers look like they are swollen at the joints. No redness or unusual warmth.  Has not really had to chang his activities. Is very interested in a diagnposis---and he wants to know if there is a "natural" treatment as he is on a lot of meds for his heart issues and would like to avoid any more rx medications.   ROS:     Negative for:  chet pain, fever, unusual weight change, skin rash, couhj, abfdominal pain, change in bowel or bladder habits, unusual fatigue, unusual swelling, depressive symptoms POSITIVES: see HPI  PERTINENT  PMH / PSH FH / / SH:  Past Medical, Surgical, Social, and Family History Reviewed & Updated in the EMR.  Pertinent findings include:  CAD s/p CABG Mitral valve repair Aortic valve repair CHF PAH Atrial fibrillation Anxiety  Currently undergoing treatment with a physician at [facility] who is trying some "natural" supplements fo rhis heart disease.   OBJECTIVE: BP (!) 124/50   Ht 5' 10" (1.778 m)   Wt 155 lb (70.3 kg)   BMI 22.24 kg/m   Physical Exam:  Vital signs are reviewed. GEN WD WN NAD SPINE: no defect noted on palpation. Nild lumbar stiffness with flexion and hyper-extension. Normal lateral rotation. No TTP in lumbar muscle area HIPS FROm w\and painless IR/ER. Normal FABER/ FADIR WRISTS normal in appearnce, no TTP. FROm flex/ext, ulnar and radial deviation FINGERS: mild swelling of  some DIP and PIP joints with  somr stiffness on full extension of right long and left index finger. No thenar atrophy.No other deformities PSYCH AXOX4. Normal affect and interaction.  ASSESSMENT & PLAN:  See problem based charting & AVS for pt instructions.

## 2017-01-01 NOTE — Assessment & Plan Note (Signed)
Long discussion. I thnk he was worried that the onset of these very typical osteoarthritic stiffness and mild pain was a sign that there was a deeper underlying problem. He is not bothered by the symptoms themselves as much. I offered X ray of back if he wanted to "confirm" diagnosis. He was curious about pssibiity we should check some "inflammatory markers" but I am not sure exactly what those would be and doubt any labs would be beneficial. We discussed supplements and glucosamine seems reasonable if he wants to try it.Greater than 50% of our 35 minute office visit was spent in counseling and education regarding these issues. F/u prn

## 2017-01-05 MED FILL — VALSARTAN 40 MG TABS: 40 | 30 days supply | Qty: 90 | Fill #1

## 2017-01-05 MED FILL — ADCIRCA 20 MG TABLET: 20 | 30 days supply | Qty: 30 | Fill #3

## 2017-01-10 IMAGING — NM NM MISC PROCEDURE
8 series · 48 of 48 positions shown · non-contrast
Comparison: none

[Series 1: wbr_r-proj_st rest · 6.51mm/px · 6 of 64 frames shown (1 of 2)]
[frame 6/64]
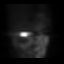
[frame 16/64]
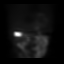
[frame 27/64]
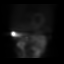
[frame 38/64]
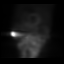
[frame 48/64]
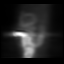
[frame 59/64]
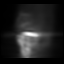

[Series 1: rest · 6.51mm/px · 6 of 64 frames shown (1 of 2)]
[frame 6/64]
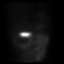
[frame 16/64]
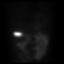
[frame 27/64]
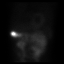
[frame 38/64]
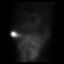
[frame 48/64]
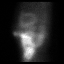
[frame 59/64]
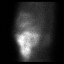

[Series 2: rest · 6.51mm/px · 6 of 64 frames shown (2 of 2)]
[frame 6/64]
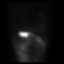
[frame 16/64]
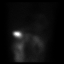
[frame 27/64]
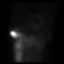
[frame 38/64]
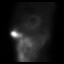
[frame 48/64]
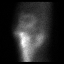
[frame 59/64]
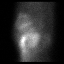

[Series 2: wbr_r-proj_st rest · 6.51mm/px · 6 of 64 frames shown (2 of 2)]
[frame 6/64]
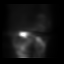
[frame 16/64]
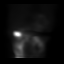
[frame 27/64]
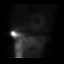
[frame 38/64]
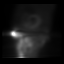
[frame 48/64]
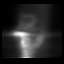
[frame 59/64]
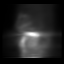

[Series 3: stress · 6.51mm/px · 6 of 64 frames shown (1 of 2)]
[frame 6/64]
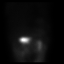
[frame 16/64]
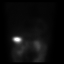
[frame 27/64]
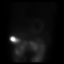
[frame 38/64]
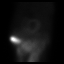
[frame 48/64]
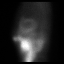
[frame 59/64]
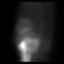

[Series 3: wbr_s-proj_st stress · 6.51mm/px · 6 of 512 frames shown (1 of 2)]
[frame 43/512]
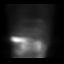
[frame 128/512]
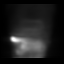
[frame 214/512]
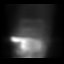
[frame 299/512]
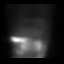
[frame 384/512]
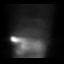
[frame 470/512]
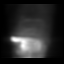

[Series 3: wbr_s-proj_st stress · 6.51mm/px · 6 of 64 frames shown (2 of 2)]
[frame 6/64]
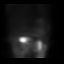
[frame 16/64]
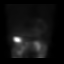
[frame 27/64]
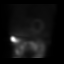
[frame 38/64]
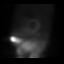
[frame 48/64]
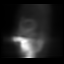
[frame 59/64]
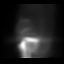

[Series 3: stress · 6.51mm/px · 6 of 512 frames shown (2 of 2)]
[frame 43/512]
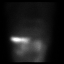
[frame 128/512]
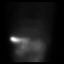
[frame 214/512]
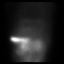
[frame 299/512]
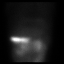
[frame 384/512]
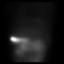
[frame 470/512]
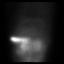

[48 of 48 positions shown; findings below may reference images not displayed]

Canned report from images found in remote index.

Refer to host system for actual result text.

## 2017-01-14 DIAGNOSIS — I10 Essential (primary) hypertension: Secondary | ICD-10-CM | POA: Diagnosis not present

## 2017-01-14 DIAGNOSIS — I700 Atherosclerosis of aorta: Secondary | ICD-10-CM | POA: Diagnosis not present

## 2017-01-14 DIAGNOSIS — G9009 Other idiopathic peripheral autonomic neuropathy: Secondary | ICD-10-CM | POA: Diagnosis not present

## 2017-01-15 DIAGNOSIS — E538 Deficiency of other specified B group vitamins: Secondary | ICD-10-CM | POA: Diagnosis not present

## 2017-01-15 DIAGNOSIS — E782 Mixed hyperlipidemia: Secondary | ICD-10-CM | POA: Diagnosis not present

## 2017-01-15 DIAGNOSIS — Z125 Encounter for screening for malignant neoplasm of prostate: Secondary | ICD-10-CM | POA: Diagnosis not present

## 2017-01-15 DIAGNOSIS — I10 Essential (primary) hypertension: Secondary | ICD-10-CM | POA: Diagnosis not present

## 2017-01-15 DIAGNOSIS — R5383 Other fatigue: Secondary | ICD-10-CM | POA: Diagnosis not present

## 2017-01-15 DIAGNOSIS — R7301 Impaired fasting glucose: Secondary | ICD-10-CM | POA: Diagnosis not present

## 2017-01-15 DIAGNOSIS — E559 Vitamin D deficiency, unspecified: Secondary | ICD-10-CM | POA: Diagnosis not present

## 2017-01-15 DIAGNOSIS — I429 Cardiomyopathy, unspecified: Secondary | ICD-10-CM | POA: Diagnosis not present

## 2017-01-15 DIAGNOSIS — G4733 Obstructive sleep apnea (adult) (pediatric): Secondary | ICD-10-CM | POA: Diagnosis not present

## 2017-01-15 DIAGNOSIS — I4891 Unspecified atrial fibrillation: Secondary | ICD-10-CM | POA: Diagnosis not present

## 2017-01-15 DIAGNOSIS — I700 Atherosclerosis of aorta: Secondary | ICD-10-CM | POA: Diagnosis not present

## 2017-01-15 DIAGNOSIS — I272 Pulmonary hypertension, unspecified: Secondary | ICD-10-CM | POA: Diagnosis not present

## 2017-01-15 DIAGNOSIS — R0602 Shortness of breath: Secondary | ICD-10-CM | POA: Diagnosis not present

## 2017-01-18 MED FILL — EPLERENONE 25 MG TABLET: 25 | 30 days supply | Qty: 30 | Fill #5

## 2017-01-26 DIAGNOSIS — N401 Enlarged prostate with lower urinary tract symptoms: Secondary | ICD-10-CM | POA: Diagnosis not present

## 2017-01-26 DIAGNOSIS — N3289 Other specified disorders of bladder: Secondary | ICD-10-CM | POA: Diagnosis not present

## 2017-01-26 DIAGNOSIS — N138 Other obstructive and reflux uropathy: Secondary | ICD-10-CM | POA: Diagnosis not present

## 2017-01-26 DIAGNOSIS — R339 Retention of urine, unspecified: Secondary | ICD-10-CM | POA: Diagnosis not present

## 2017-02-15 ENCOUNTER — Ambulatory Visit (HOSPITAL_COMMUNITY)
Admission: RE | Admit: 2017-02-15 | Discharge: 2017-02-15 | Disposition: A | Discharge status: Home / Self Care | Payer: Medicare Other | Source: Ambulatory Visit | Attending: Cardiology | Admitting: Cardiology

## 2017-02-15 ENCOUNTER — Encounter (HOSPITAL_COMMUNITY): Payer: Self-pay | Admitting: Cardiology

## 2017-02-15 ENCOUNTER — Other Ambulatory Visit: Payer: Self-pay | Admitting: Cardiology

## 2017-02-15 VITALS — BP 128/66 | HR 79 | Wt 156.0 lb

## 2017-02-15 DIAGNOSIS — I351 Nonrheumatic aortic (valve) insufficiency: Secondary | ICD-10-CM | POA: Insufficient documentation

## 2017-02-15 DIAGNOSIS — I498 Other specified cardiac arrhythmias: Secondary | ICD-10-CM | POA: Diagnosis not present

## 2017-02-15 DIAGNOSIS — I6523 Occlusion and stenosis of bilateral carotid arteries: Secondary | ICD-10-CM | POA: Diagnosis not present

## 2017-02-15 DIAGNOSIS — I480 Paroxysmal atrial fibrillation: Secondary | ICD-10-CM | POA: Insufficient documentation

## 2017-02-15 DIAGNOSIS — G4733 Obstructive sleep apnea (adult) (pediatric): Secondary | ICD-10-CM | POA: Diagnosis not present

## 2017-02-15 DIAGNOSIS — Z7982 Long term (current) use of aspirin: Secondary | ICD-10-CM | POA: Diagnosis not present

## 2017-02-15 DIAGNOSIS — Z951 Presence of aortocoronary bypass graft: Secondary | ICD-10-CM

## 2017-02-15 DIAGNOSIS — I4892 Unspecified atrial flutter: Secondary | ICD-10-CM | POA: Insufficient documentation

## 2017-02-15 DIAGNOSIS — I2510 Atherosclerotic heart disease of native coronary artery without angina pectoris: Secondary | ICD-10-CM | POA: Insufficient documentation

## 2017-02-15 DIAGNOSIS — Z9889 Other specified postprocedural states: Secondary | ICD-10-CM | POA: Diagnosis not present

## 2017-02-15 DIAGNOSIS — I2721 Secondary pulmonary arterial hypertension: Secondary | ICD-10-CM | POA: Insufficient documentation

## 2017-02-15 DIAGNOSIS — I255 Ischemic cardiomyopathy: Secondary | ICD-10-CM | POA: Insufficient documentation

## 2017-02-15 DIAGNOSIS — E785 Hyperlipidemia, unspecified: Secondary | ICD-10-CM | POA: Diagnosis not present

## 2017-02-15 DIAGNOSIS — Z79899 Other long term (current) drug therapy: Secondary | ICD-10-CM | POA: Insufficient documentation

## 2017-02-15 DIAGNOSIS — I272 Pulmonary hypertension, unspecified: Secondary | ICD-10-CM

## 2017-02-15 DIAGNOSIS — I5022 Chronic systolic (congestive) heart failure: Secondary | ICD-10-CM | POA: Insufficient documentation

## 2017-02-15 DIAGNOSIS — G629 Polyneuropathy, unspecified: Secondary | ICD-10-CM | POA: Diagnosis not present

## 2017-02-15 DIAGNOSIS — R001 Bradycardia, unspecified: Secondary | ICD-10-CM | POA: Insufficient documentation

## 2017-02-15 LAB — BASIC METABOLIC PANEL
Anion gap: 8 (ref 5–15)
BUN: 21 mg/dL — ABNORMAL HIGH (ref 6–20)
CALCIUM: 9.1 mg/dL (ref 8.9–10.3)
CO2: 29 mmol/L (ref 22–32)
Chloride: 99 mmol/L — ABNORMAL LOW (ref 101–111)
Creatinine, Ser: 0.98 mg/dL (ref 0.61–1.24)
GFR calc Af Amer: >60 mL/min (ref >60)
GLUCOSE: 112 mg/dL — AB (ref 65–99)
POTASSIUM: 3.5 mmol/L (ref 3.5–5.1)
SODIUM: 136 mmol/L (ref 135–145)

## 2017-02-15 LAB — MAGNESIUM: MAGNESIUM: 2.2 mg/dL (ref 1.7–2.4)

## 2017-02-15 MED ORDER — VALSARTAN 40 MG PO TABS: ORAL_TABLET | ORAL | 1 refills | Status: DC

## 2017-02-15 MED FILL — EPLERENONE 25 MG TABLET: 25 | 30 days supply | Qty: 30 | Fill #6

## 2017-02-15 NOTE — Progress Notes (Signed)
Pt completed 6 minute walk test. Pt walked 1200 ft (366 meters). O2 sats ranged from 97%-99%, and HR ranged from 98-118. Information given to [name] and no further orders placed.

## 2017-02-15 NOTE — Progress Notes (Signed)
Patient ID: Caleb Moment, PhD, male   DOB: 06-20-1940, 76 y.o.   MRN: [id number]    Advanced Heart Failure Clinic Note   PCP: [name] EP: [name] Cardiology: [name]  76 yo with complex past history presents for heart failure followup.  Patient had anterior MI in 2004 and developed ischemic cardiomyopathy as well as mitral regurgitation. He also developed atrial fibrillation.  In 10/14, he had MV repair, Maze, CABG with LIMA-LAD, and LA appendage closure at [facility] in [place].  Subsequently, atrial fibrillation returned and he had an atrial fibrillation ablation in [facility] in 3/15.  He wore a Zio patch in 12/15 and had a low atrial fibrillation burden of 7%.  He has had a long-standing ischemic cardiomyopathy.  In 2015, EF was 20-25%.  Echo in 2016 also showed EF 25-30% but estimated PA pressure suggested severe pulmonary hypertension.     At initial appointment, he reported increased exertional dyspnea over a number of weeks.  RHC was done, showing mildly elevated PCWP with moderate pulmonary HTN and low cardiac index (1.93 thermo, 2.13 Fick).  V/Q scan showed no PE.  I started him on digoxin and have titrated his Lasix to 80 mg daily.  He is now on bisoprolol and able to tolerate it.  At last appointment, I had him try replacing valsartan with Entresto 24/26 bid.  He was unable to tolerate it (made him dizzy, though BP was not low).  Therefore, I had him restart valsartan.  CPX in 4/16 showed mildly decreased functional capacity.  He has tolerated Adcirca 20 mg daily.  He did not tolerate an attempt to uptitrate bisoprolol.  He tried CPAP but was unable to tolerate it.  He was unable to tolerate eplerenone 50 mg daily.  Last echo in 9/17 showed EF 20-25%, stable MV repair, normal RV, and PA systolic pressure 86 mmHg.   He had a Lexiscan Cardiolite in 9/17 that showed infarction, no ischemia.  [place] in 10/17 showed severe mixed pulmonary venous HTN/pulmonary arterial HTN with PVR 4.3  WU and PCWP mildly elevated.  Adcirca was increased to 40 mg daily and Lasix was increased to 80 qam/40 qpm.  I switched him over to torsemide because of increased weight.  However, he started back on Lasix as he did not think torsemide was as effective.   At a prior appointment, he was having more palpitations.  Event monitor in 11/17 showed atrial fibrillation episodes, these were symptomatic.  Since then, the atrial fibrillation seems to have decreased.  He is in NSR today.  He saw [name] to discuss Tikosyn initiation.  No decision was reached at that time, and since palpitations have decreased again, he wants to hold off on Tikosyn for now.   He has OSA.  He cannot tolerate CPAP but is using his oral appliance.    HR was lower in 5/18.  He came into the office with HR around 40.  ECG showed an ectopic atrial rhythm with very small P waves. I stopped bisoprolol and later digoxin.  HR has gone up since.  He wore an event monitor in 6/18 showing rare 3-5 second pauses at night only, rare atrial fibrillation, and rare junctional bradycardia.    He returns today for followup of CHF. He is symptomatically stable.  No dyspnea walking on flat ground currently though 6 minute walk today was not as good as in the past.  Occasional palpitations but he is in NSR today. No orthopnea/PND.  No chest pain.  Weight down 1 lb.    ECG (personally reviewed): NSR, 1st degree AV block, LBBB with QRS 136 msec  6 minute walk (3/16): 381 m  6 minute walk (5/16): 414.5 m 6 minute walk (10/16): 562 m 6 minute walk (1/17): 469 m 6 minute walk (8/17): 488 m 6 minute walk (6/18): 549 m 6 minute walk (11/18): 366 m  Labs (2/15): LDL 144 Labs (8/15): K 4.6, creatinine 0.9 Labs (12/15): HCT 42.3   Labs (2/16): K 4 => 4.2, creatinine 1.05 => 0.92, BNP 268 Labs (3/16): BNP 495 => 320, digoxin 0.4, RF 14.7 (very mild increase), TSH normal, HIV negative, anti-SCL70 negative, creatinine 0.91, K 4.4 Labs (7/16): K 5,  creatinine 1.05, BNP 455, vitamin D normal, digoxin 0.4, B12 normal Labs (8/16): digoxin 0.8, BNP 305, K 4.2, creatinine 0.99 Labs (9/16): HCT 43.3, TSH normal, BNP 492 => 278, K 4.3, creatinine 0.97 => 1.04, digoxin 0.6, TSH normal, LDL 154, LDL-P 1654.  Labs (10/16): K 4.7, creatinine 1.1 Labs (1/17): pro-BNP 2065, digoxin 0.3, K 4.3, creatinine 1.10 => 1.28 Labs (3/17): K 4.1, creatinine 1.09, HCT 38.3 Labs (4/17): K 4.4, creatinine 0.99, digoxin 0.8 Labs (5/17): K 4.3, creatinine 1.08, BNP 317, hgb 13.1 Labs (8/17): K 4.6, creatinine 0.99, BNP 392, digoxin 0.5 Labs (9/17): K 4.4, creatinine 1.05 Labs (10/17): digoxin 0.5, K 4.1, creatinine 1.12, HCT 44.9, digoxin 0.5 Labs (11/17): K 4 => 3.8, creatinine 1.3 => 1.16, digoxin 0.5, BNP 296 Labs (12/17): K 4.1, creatinine 1.15, BNP 294, digoxin 0.7 Labs (2/18): LDL 135, Lp(a) 124, LDL-P 979  Labs (5/18): K 4.3, creatinine 1.07 => 0.95, BNP 224, hgb 14.4, digoxin 0.6 Labs (6/18): K 4.4, creatinine 1.08, hgb 14 Labs (8/18): K 4.1, creatinine 1.05  PMH: 1. CAD: Anterior MI in 2004.  Cardiac surgery in 10/14 included LIMA-LAD.  - Lexiscan Cardiolite (9/17): EF 35%, infarct present with no ischemia.  2. Chronic mitral regurgitation: 10/14 surgery at [facility] with MV repair, Maze, LIMA-LAD, and LA appendage closure.  3. Atrial fibrillation: Paroxysmal.  He was initially on Tikosyn but had breakthrough atrial fibrillation.  H/o Maze in 2014.  Had recurrent atrial fibrillation with ablation in 3/15 by Dr Ola Spurr in [facility].  Not anticoagulated after 2 severe prostate bleeding episodes.  LA appendage was clipped with MV surgery.  - Zio patch in 12/15 with low atrial fibrillation burden (7%).  - Holter (9/16) with PACs, PVCs, short atrial fibrillation runs (nothing sustained). - Event monitor (11/17) with runs of atrial fibrillation and flutter.   - Event monitor (6/18) with rare atrial fibrillation 4. Chronotropic incompetence.  5.  HTN 6. Hyperlipidemia: Refuses statin.  7. Peripheral neuropathy 8. GERD 9. H/o BPPV 10. H/o TIA 11. Ischemic cardiomyopathy: cardiac MRI 7/14 with EF 35%, moderate MR, normal RV size and systolic function, extensive anterior and anteroseptal LGE suggestive of non-viable myocardium (this was prior to LIMA-LAD).  Echo 1/15 with EF 20-25%, moderate AI, PA systolic pressure 39 mmHg. Echo (1/16) with EF 20-25%, diffuse hypokinesis with regionality, moderate LV dilation, moderate AI, s/p MV repair with mild MR and normal gradients, RV dilated with mildly decreased systolic function, PA systolic pressure 71 mmHg.   - RHC (2/16) with mean RA 9, PA 61/25 mean 40, mean PCWP 23, CI 2.13/PVR 4.3 (Fick), CI 1.93/PVR 4.7 (thermo).   - CPX (4/16) with peak VO2 17.9, VE/VCO2 34.7 => mildly decreased functional capacity.  - Spironolactone apparently caused cognitive deficits - Intolerant of  Coreg due to development of severe alopecia - Unable to uptitrate bisoprolol due to intolerance.  - Lightheaded with Entresto.  - Unable to tolerate increase in valsartan to 80 mg bid.  - Echo (1/17) with EF 30-35%, moderate LV dilation, mild AI, s/p MV repair with mild MR, moderately dilated RV with mildly decreased systolic function, PA systolic pressure 69 mmHg.  - CPX (4/17): peak VO2 18, VE/VCO2 slope 33, RER 1.28 => mild to moderate functional impairment, mildly improved.  - Echo (9/17): EF 20-25% with regional WMAs, normal RV size and systolic function, PASP 86 mmHg, stable repaired mitral valve with mild MR, moderate AI.  - RHC (10/17): mean RA 9, PA 70/23 mean 43, PCWP mean 20, CI 2.96 Fick/2.84 Thermo, PVR 4.3 WU.  - Echo (9/18): severe LV dilation, EF 20-25% with WMAs, s/p MV repair with mild MR, moderate AI, moderate TR, PASP 68 mmHg, normal RV size and systolic function.  12. Carotid stenosis: Carotid dopplers (1/16) with 40-59% bilateral ICA stenosis. Carotid dopplers (4/17) with 40-59% BICA stenosis.  - Carotid  dopplers (2/18) with < 50% BICA stenosis.  13. Aortic insufficiency: Moderate by last echo 9/17.  14. Pulmonary HTN: Mixed PAH and pulmonary venous hypertension.  PFTs (9/14) were normal.  V/Q scan (2/16) with no evidence of acute or chronic PE.  6 minute walk (3/16) 381 m. 6 minute walk (5/16) 414.5 m. 6 minute walk (1/17) 469 m.  15. OSA: Moderate on 5/16 sleep study. Unable to tolerate CPAP.  Repeat sleep study at [facility] in 2/18 was also suggestive of OSA.  16. Venous insufficiency 17. Ventral hernia 18. Bradycardia: Holter (5/18) with overnight pauses up to 4.7 sec (likely due to OSA), occasional short atrial tachycardia runs, no atrial fibrillation, avg HR 70s, 3% PVCs.  - Event monitor (6/18): Rare 3-4 second pauses at night, rare atrial fibrillation, rare runs of junctional bradycardia.   SH: Married, lives in [place], Engineer, water, nonsmoker  FH: CAD  ROS: All systems reviewed and negative except as per HPI.   Current Outpatient Medications  Medication Sig Dispense Refill  . aspirin 81 MG chewable tablet Chew 81 mg by mouth daily.    Marland Kitchen eplerenone (INSPRA) 25 MG tablet TAKE 1 TABLET BY MOUTH ONCE DAILY 30 tablet 6  . famotidine (PEPCID) 20 MG tablet Take 20 mg by mouth daily as needed for heartburn or indigestion.    . Magnesium 100 MG CAPS Take 1 capsule by mouth 3 (three) times daily.     . Multiple Vitamins-Minerals (MULTIVITAMIN WITH MINERALS) tablet Take 1 tablet by mouth daily.    . Omega-3 Fatty Acids (THE VERY FINEST FISH OIL) LIQD Take 2-3 g by mouth daily.     . potassium chloride (K-DUR,KLOR-CON) 10 MEQ tablet Take 2 tablets (20 mEq total) by mouth daily. 60 tablet 3  . tadalafil, PAH, (ADCIRCA) 20 MG tablet Take 1 tablet (20 mg total) by mouth daily. 30 tablet 3  . torsemide (DEMADEX) 20 MG tablet Take 40 mg by mouth daily.    . valsartan (DIOVAN) 40 MG tablet Take 60 mg (1.5 tab) in AM, then 80 mg (2 tabs) in PM 105 tablet 1  . Zinc 100 MG TABS Take 1 tablet by  mouth daily.      No current facility-administered medications for this encounter.    BP 128/66   Pulse 79   Wt 156 lb (70.8 kg)   SpO2 98%   BMI 22.38 kg/m    Wt Readings from  Last 3 Encounters:  02/15/17 156 lb (70.8 kg)  01/01/17 155 lb (70.3 kg)  11/09/16 157 lb 4 oz (71.3 kg)    General: NAD Neck: No JVD, no thyromegaly or thyroid nodule.  Lungs: Clear to auscultation bilaterally with normal respiratory effort. CV: Nondisplaced PMI.  Heart regular S1/S2, no S3/S4, 2/6 early SEM RUSB.  Trace ankle edema.  No carotid bruit.  Normal pedal pulses.  Abdomen: Soft, nontender, no hepatosplenomegaly, no distention.  Skin: Intact without lesions or rashes.  Neurologic: Alert and oriented x 3.  Psych: Normal affect. Extremities: No clubbing or cyanosis.  HEENT: Normal.   Assessment/Plan: 1. CAD: S/p LIMA-LAD.  He is on ASA 81.  He has decided not to take statins after reviewing the data (I did recommend taking a statin but we have agreed to disagree on this, he is taking red yeast rice extract).  Lexiscan Cardiolite was done in 9/17 due to atypical chest pain.  This showed prior infarction with no ischemia.  No recurrence of chest pain since that time despite ongoing exercise.   2. S/p mitral valve repair: The MV repair looked stable on 9/18 echo with no evidence for significant mitral stenosis and mild MR.  3. Aortic insufficiency: Moderate on 9/18 echo.   4. Chronic systolic CHF: Ischemic cardiomyopathy, EF 20-25% on 9/18 echo, stable.  PA pressure remained elevated by doppler measurement but RV systolic function was normal.  Mildly decreased functional capacity on 4/16 CPX, this was mildly improved on 4/17 CPX.  NYHA class II.  He is not volume overloaded on exam, generally feeling well.    - Continue torsemide 40 mg daily, BMET today.  - Bisoprolol and digoxin were both stopped with bradycardia and lightheadedness.  He no longer has lightheaded spells and HR 79 today.  - He did not  tolerate Entresto.  Will try to slowly increase valsartan to 60 qam/80 qpm.  BMET 10 days.   - Continue eplerenone 25 (unable to tolerate increase).        - ECG reviewed, QRS 136 msec with LBBB.  He asks today about CRT (refused ICD in the past). QRS does seem to be slowly widening, but benefit may only be marginal with QRS < 150 msec.  He is interested in discussing this with [name], I will send him back for evaluation.  5. Atrial fibrillation: s/p Maze in 10/14, then ablation in 3/15.  Prior to Maze, he was on Tikosyn but had breakthrough.  Currently, he is not anticoagulated due to history of prostate bleeding and his choice.  He did have his LA appendage clipped at time of MV surgery.  At prior appointment, he was feeling more atrial fibrillation (confirmed by event monitor).  He is symptomatic when he has atrial fibrillation (feels considerably more tired).  He is having fewer episodes of palpitations now.  - Off nodal blockers with bradycardia.    - We have had discussions about what to do with his atrial fibrillation.  He is symptomatic when in atrial fibrillation/fluttter.  He had breakthrough on Tikosyn in the past, but has had Maze and atrial fibrillation ablation since that time.  I suggested anticoagulation with Eliquis and re-attempting Tikosyn (cannot tell him definitively that he would be protected enough by the LA appendage clipping alone).  He talked to Dr Caryl Comes about this.  He opted to hold off on Tikosyn.  6. Pulmonary hypertension: Severe by most recent echo in 9/17 and on RHC in 10/17.  RV actually  looked ok on 9/17 echo.  Mixed pulmonary venous and pulmonary arterial HTN on RHC.  It is possible that the [facility] component is due to pulmonary vascular remodeling in the setting of chronic mitral regurgitation prior to MV repair. Negative V/Q scan, no evidence for CTEPH.  PFTs normal in 9/14.  He is seeing Dr Lake Bells.  Sleep study showed moderate OSA but he has been unable to tolerated CPAP  and is now using an oral device.  - I had wanted him to increase Adcirca to 40 mg daily but he wants to stay on 20 mg daily.   - Given advantage of combination therapy, I talked to him about adding an ERA.  He so far has not wanted to do this.  7. OSA: Moderate.  Has not tolerated CPAP. Probably plays a role in recurrent atrial fibrillation and pulmonary hypertension as well as nocturnal sinus pauses.  He has been using his oral device. 8. Carotid stenosis: 2/18 carotid dopplers at [facility] with < 50% bilateral stenosis.   9. Bradycardia: Has had junctional bradycardia and nocturnal pauses, pauses likely related to OSA.  Now off digoxin and bisoprolol, no longer having episodes of low HR.  He is in NSR today.  Continue to follow.    Followup in 3 months.   Loralie Champagne 02/15/2017

## 2017-02-15 NOTE — Patient Instructions (Addendum)
Increase Valsartan 60 mg (1.5 tabs) in AM, then 80 mg (2 tabs) in PM   Labs drawn today (if we do not call you, then your lab work was stable)   Your physician recommends that you return for lab work in: 10 days  6 minute walk today  You have been referred to [name] (they will call you)   Your physician recommends that you schedule a follow-up appointment in: 3 months with [name]

## 2017-02-16 MED FILL — POTASSIUM CL ER 10 MEQ TABL: 10 | 30 days supply | Qty: 60 | Fill #0

## 2017-02-16 MED FILL — ADCIRCA 20 MG TABLET: 20 | 30 days supply | Qty: 30 | Fill #0

## 2017-02-17 MED FILL — VALSARTAN 40 MG TABS: 40 | 30 days supply | Qty: 105 | Fill #0

## 2017-02-22 ENCOUNTER — Ambulatory Visit (INDEPENDENT_AMBULATORY_CARE_PROVIDER_SITE_OTHER): Payer: Medicare Other | Admitting: Internal Medicine

## 2017-02-22 ENCOUNTER — Encounter: Payer: Self-pay | Admitting: Internal Medicine

## 2017-02-22 VITALS — BP 114/58 | HR 76 | Ht 70.0 in | Wt 156.4 lb

## 2017-02-22 DIAGNOSIS — I498 Other specified cardiac arrhythmias: Secondary | ICD-10-CM | POA: Diagnosis not present

## 2017-02-22 NOTE — Progress Notes (Signed)
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 He has a history of ischemic heart disease dating back to 2004 when he presented with an anterior wall MI. He had progressive mitral regurgitation and a dilated aortic root and he went to [place] underwent mitral valve repair aortic root surgery and a CryoMaze He also had a single vessel bypass with a LIMA to his LAD Echocardiogram 1/15 it demonstrated ejection fraction 20-25% with moderate AR mild MR with a prosthestic ring.   Subsequently, atrial fibrillation returned and he had an atrial fibrillation ablation in [facility] in 3/15. He wore a Zio patch in 12/15 and had a low atrial fibrillation burden of 7%. He has had a long-standing ischemic cardiomyopathy. In 2015, EF was 20-25%. Echo in 2016 also showed EF 25-30% but estimated PA pressure suggested severe pulmonary hypertension, which is new for him. 9/18 Echo EF 20-25% mod AI   He has been following up in the heart failure clinic. Right heart catheterization about a year ago demonstrated moderate pulmonary hypertension and a low cardiac index about 2. Digoxin was started. Entresto was not tolerated. CPAP was not tolerated. Echo 1/17 demonstrated some interval improvement in LVEF at 30-35 with again moderate pulmonary hypertension 69 mm with a moderately dilated RV  90/17 Myoview no ischemia 10/17 right heart catheterization severe pulmonary hypertension of mixed variety.  He was seen a couple of weeks ago and found to be recurrent atypical atrial flutter. I last saw him 3/17. He has not been on anticoagulation as he had atrial appendage oversewing.  Discussions re Phyllis Ginger were broached but not consummated    Saw  Dr DM last week;  he had seen Dr. Donnal Debar in [place] number of weeks before the issue has been raised as to whether he needed pacing.  I spoke with Dr. DM today and went back and reviewed personally the Holter monitor wherein bradycardia was noted with heart rates into the 30s but also heart rates into the 90-100s with exertion.  Holter monitor had prompted the discontinuation of beta-blockers and digoxin  Past Medical History:  Diagnosis Date  . Allergic rhinitis   . Atrial fibrillation ([place])   . Atypical pneumonia   . CAD (coronary artery disease)   . Cardiomyopathy, ischemic   . Chronic anticoagulation   . Cough   . Dizziness   . GERD (gastroesophageal reflux disease)   . Heart failure, systolic, acute on chronic ([place])   . Hyperlipidemia   . Hypertension   . OSA (obstructive sleep apnea)    Home sleep test 07/05/2009 AHI 8.2  . Pleural effusion   . Positional vertigo   . TIA (transient ischemic attack)     Past Surgical History:  Procedure Laterality Date  . AORTIC VALVE REPAIR  01/09/13  . CARDIAC CATHETERIZATION N/A 01/10/2016   Procedure: Right Heart Cath;  Surgeon: Larey Dresser, MD;  Location: [place] CV LAB;  Service: Cardiovascular;  Laterality: N/A;  . CORONARY ARTERY BYPASS GRAFT  01/09/13   LAD LIMA, left atrial appendage  . CORONARY STENT PLACEMENT  2004   LAD  . MITRAL VALVE ANNULOPLASTY  01/09/13  . RIGHT HEART CATHETERIZATION N/A 05/11/2014   Procedure: RIGHT  HEART CATH;  Surgeon: Larey Dresser, MD;  Location: [facility] CATH LAB;  Service: Cardiovascular;  Laterality: N/A;  . SHOULDER SURGERY    . TEE WITHOUT CARDIOVERSION N/A 09/21/2012   Procedure: TRANSESOPHAGEAL ECHOCARDIOGRAM (TEE);  Surgeon: Larey Dresser, MD;  Location: [facility] ENDOSCOPY;  Service: Cardiovascular;  Laterality: N/A;    Current Outpatient Medications  Medication Sig Dispense Refill  . ADCIRCA 20 MG tablet TAKE 1 TABLET BY MOUTH ONCE DAILY 30 tablet 3  . aspirin 81 MG  chewable tablet Chew 81 mg by mouth daily.    Marland Kitchen eplerenone (INSPRA) 25 MG tablet TAKE 1 TABLET BY MOUTH ONCE DAILY 30 tablet 6  . Magnesium 100 MG CAPS Take 1 capsule by mouth 3 (three) times daily.     . Multiple Vitamins-Minerals (MULTIVITAMIN WITH MINERALS) tablet Take 1 tablet by mouth daily.    . Omega-3 Fatty Acids (THE VERY FINEST FISH OIL) LIQD Take 2-3 g by mouth daily.     . potassium chloride (K-DUR,KLOR-CON) 10 MEQ tablet TAKE 2 TABLETS BY MOUTH DAILY. 60 tablet 3  . torsemide (DEMADEX) 20 MG tablet Take 40 mg by mouth daily.    . valsartan (DIOVAN) 40 MG tablet Take 60 mg (1.5 tab) in AM, then 80 mg (2 tabs) in PM 105 tablet 1  . Zinc 100 MG TABS Take 1 tablet by mouth daily.      No current facility-administered medications for this visit.     Allergies  Allergen Reactions  . Carvedilol Other (See Comments)    Dizziness, uneasiness, alopecia  . Dabigatran Rash  . Pradaxa [Dabigatran Etexilate Mesylate] Rash  . Sulfonamide Derivatives Rash    REACTION: rash  . Xarelto [Rivaroxaban] Rash    Review of Systems negative except from HPI and PMH  Physical Exam BP (!) 114/58   Pulse 76   Ht _0  (1.778 m)   Wt 156 lb 6.4 oz (70.9 kg)   BMI 22.44 kg/m  Well developed and well nourished in no acute distress HENT normal E scleral and icterus clear Neck Supple JVP flat; carotids brisk and full Clear to ausculation  Regular rate and rhythm, no murmurs gallops or rub Soft with active bowel sounds No clubbing cyanosis left 1+ right trace edema Edema Alert and oriented, grossly normal motor and sensory function Skin Warm and Dry   ECG demonstrated from this morning was reviewed personally and demonstrated NSR  Event recorder was also reviewed. It demonstrates recurrent episodes of atrial tachyarrhythmia. Atrial amplitudes are 2 low to detect specific atrial activity. There is one episode with a pause of 3.3 seconds. No associated symptoms. Symptoms of lightheadedness  were associated with irregularity probably atrial fibrillation/flutter and PVCs   Assessment and  Plan  Atrial flutter/fibrillation  MV repair Cryo Maze  Chronic systolic heart failure   Ischemic/nonischemic cardiomyopathy with prior bypass  Atrial oversewing  Bradycardia    The issue today is whether he should undergo pacing.  I discussed with Dr. DM as noted above his history of bradycardia.  There was a research trial proposed about 10 years ago which was never consummated apparently to look at the role of prophylactic pacing to support adjunctive use of beta-blockers.  I found a 2004 paper using a Markov analysis suggesting cost effectiveness.   There was also apparently the issue raised in [place] about resynchronization.  His QRS duration is 136 ms that his conduction pattern is IVCD.  Hence, I am not enthusiastic about device implantation.  I  will plan to talk with his cardiologist in [place] to try to glean his insights  More than 50% of 45 min was spent in counseling related to the above

## 2017-02-22 NOTE — Patient Instructions (Signed)
Medication Instructions:  Your physician recommends that you continue on your current medications as directed. Please refer to the Current Medication list given to you today.  * If you need a refill on your cardiac medications before your next appointment, please call your pharmacy. *  Labwork: None ordered  Testing/Procedures: None ordered  Follow-Up: Please call the office with name of cardiologist at [facility] for [name] to discuss your treatment plan.  Thank you for choosing [facility]!!

## 2017-02-22 NOTE — Progress Notes (Signed)
[facility] physician information: Dr Donnal Debar     Lynett Fish, RN  ([email or web address]) [phone]  [Tu/Th/Fri clinic day  (Wednesdays are bad days to consult)]

## 2017-02-24 ENCOUNTER — Telehealth (HOSPITAL_COMMUNITY): Payer: Self-pay | Admitting: Pharmacist

## 2017-02-24 NOTE — Telephone Encounter (Signed)
Valsartan #105/30 days PA approved by [employer] through 03/22/18.   [name], PharmD, BCPS, CPP Clinical Pharmacist Pager: [phone] Phone: [phone] 02/24/2017 10:30 AM

## 2017-02-25 ENCOUNTER — Ambulatory Visit (HOSPITAL_COMMUNITY)
Admission: RE | Admit: 2017-02-25 | Discharge: 2017-02-25 | Disposition: A | Discharge status: Home / Self Care | Payer: Medicare Other | Source: Ambulatory Visit | Attending: Internal Medicine | Admitting: Internal Medicine

## 2017-02-25 DIAGNOSIS — I5022 Chronic systolic (congestive) heart failure: Secondary | ICD-10-CM | POA: Diagnosis not present

## 2017-02-25 LAB — BASIC METABOLIC PANEL
ANION GAP: 8 (ref 5–15)
BUN: 21 mg/dL — ABNORMAL HIGH (ref 6–20)
CALCIUM: 9.2 mg/dL (ref 8.9–10.3)
CO2: 32 mmol/L (ref 22–32)
Chloride: 97 mmol/L — ABNORMAL LOW (ref 101–111)
Creatinine, Ser: 0.94 mg/dL (ref 0.61–1.24)
GLUCOSE: 93 mg/dL (ref 65–99)
POTASSIUM: 3.7 mmol/L (ref 3.5–5.1)
SODIUM: 137 mmol/L (ref 135–145)

## 2017-03-03 MED FILL — TORSEMIDE 20 MG TABS: 20 | 30 days supply | Qty: 120 | Fill #3

## 2017-03-09 NOTE — Progress Notes (Signed)
He was during his appt

## 2017-03-17 ENCOUNTER — Other Ambulatory Visit (HOSPITAL_COMMUNITY): Payer: Self-pay | Admitting: Cardiology

## 2017-03-18 MED FILL — EPLERENONE 25 MG TABLET: 25 | 30 days supply | Qty: 30 | Fill #0

## 2017-03-19 ENCOUNTER — Other Ambulatory Visit (HOSPITAL_COMMUNITY): Payer: Self-pay

## 2017-03-19 MED ORDER — EPLERENONE 25 MG PO TABS: 25.0000 mg | ORAL_TABLET | Freq: Every day | ORAL | 11 refills | Status: DC

## 2017-04-12 ENCOUNTER — Telehealth (HOSPITAL_COMMUNITY): Payer: Self-pay | Admitting: Pharmacist

## 2017-04-12 NOTE — Telephone Encounter (Signed)
Tadalafil PA approved by [employer] through 03/22/18.   [name], PharmD, BCPS, CPP Clinical Pharmacist Phone: [phone] 04/12/2017 10:22 AM

## 2017-04-20 MED FILL — POTASSIUM CL ER 10 MEQ TABL: 10 | 30 days supply | Qty: 60 | Fill #1

## 2017-04-20 MED FILL — EPLERENONE 25 MG TABLET: 25 | 30 days supply | Qty: 30 | Fill #1

## 2017-04-27 ENCOUNTER — Encounter: Payer: Self-pay | Admitting: Internal Medicine

## 2017-04-28 MED FILL — VALSARTAN 40 MG TABS: 40 | 30 days supply | Qty: 105 | Fill #1

## 2017-05-03 ENCOUNTER — Other Ambulatory Visit (HOSPITAL_COMMUNITY): Payer: Self-pay | Admitting: *Deleted

## 2017-05-03 ENCOUNTER — Other Ambulatory Visit (HOSPITAL_COMMUNITY): Payer: Self-pay | Admitting: Cardiology

## 2017-05-03 MED ORDER — TORSEMIDE 20 MG PO TABS: 40.0000 mg | ORAL_TABLET | Freq: Every day | ORAL | 3 refills | Status: DC

## 2017-05-03 MED FILL — TORSEMIDE 20 MG TABS: 20 | 30 days supply | Qty: 60 | Fill #0

## 2017-05-18 ENCOUNTER — Encounter (HOSPITAL_COMMUNITY): Payer: Self-pay | Admitting: Cardiology

## 2017-05-18 ENCOUNTER — Ambulatory Visit (HOSPITAL_COMMUNITY)
Admission: RE | Admit: 2017-05-18 | Discharge: 2017-05-18 | Disposition: A | Discharge status: Home / Self Care | Payer: Medicare Other | Source: Ambulatory Visit | Attending: Cardiology | Admitting: Cardiology

## 2017-05-18 ENCOUNTER — Other Ambulatory Visit: Payer: Self-pay

## 2017-05-18 VITALS — BP 128/52 | HR 73 | Wt 157.2 lb

## 2017-05-18 DIAGNOSIS — R001 Bradycardia, unspecified: Secondary | ICD-10-CM | POA: Insufficient documentation

## 2017-05-18 DIAGNOSIS — I272 Pulmonary hypertension, unspecified: Secondary | ICD-10-CM

## 2017-05-18 DIAGNOSIS — I255 Ischemic cardiomyopathy: Secondary | ICD-10-CM | POA: Diagnosis not present

## 2017-05-18 DIAGNOSIS — Z79899 Other long term (current) drug therapy: Secondary | ICD-10-CM | POA: Diagnosis not present

## 2017-05-18 DIAGNOSIS — G4733 Obstructive sleep apnea (adult) (pediatric): Secondary | ICD-10-CM | POA: Diagnosis not present

## 2017-05-18 DIAGNOSIS — I4589 Other specified conduction disorders: Secondary | ICD-10-CM | POA: Insufficient documentation

## 2017-05-18 DIAGNOSIS — Z9889 Other specified postprocedural states: Secondary | ICD-10-CM

## 2017-05-18 DIAGNOSIS — I429 Cardiomyopathy, unspecified: Secondary | ICD-10-CM

## 2017-05-18 DIAGNOSIS — I6523 Occlusion and stenosis of bilateral carotid arteries: Secondary | ICD-10-CM | POA: Insufficient documentation

## 2017-05-18 DIAGNOSIS — I252 Old myocardial infarction: Secondary | ICD-10-CM | POA: Insufficient documentation

## 2017-05-18 DIAGNOSIS — I351 Nonrheumatic aortic (valve) insufficiency: Secondary | ICD-10-CM | POA: Diagnosis not present

## 2017-05-18 DIAGNOSIS — G629 Polyneuropathy, unspecified: Secondary | ICD-10-CM | POA: Insufficient documentation

## 2017-05-18 DIAGNOSIS — Z951 Presence of aortocoronary bypass graft: Secondary | ICD-10-CM | POA: Diagnosis not present

## 2017-05-18 DIAGNOSIS — I5022 Chronic systolic (congestive) heart failure: Secondary | ICD-10-CM | POA: Diagnosis not present

## 2017-05-18 DIAGNOSIS — Z7982 Long term (current) use of aspirin: Secondary | ICD-10-CM | POA: Insufficient documentation

## 2017-05-18 DIAGNOSIS — E785 Hyperlipidemia, unspecified: Secondary | ICD-10-CM | POA: Insufficient documentation

## 2017-05-18 DIAGNOSIS — I110 Hypertensive heart disease with heart failure: Secondary | ICD-10-CM | POA: Insufficient documentation

## 2017-05-18 DIAGNOSIS — I872 Venous insufficiency (chronic) (peripheral): Secondary | ICD-10-CM | POA: Diagnosis not present

## 2017-05-18 DIAGNOSIS — I480 Paroxysmal atrial fibrillation: Secondary | ICD-10-CM | POA: Diagnosis not present

## 2017-05-18 DIAGNOSIS — Z8249 Family history of ischemic heart disease and other diseases of the circulatory system: Secondary | ICD-10-CM | POA: Diagnosis not present

## 2017-05-18 DIAGNOSIS — Z8673 Personal history of transient ischemic attack (TIA), and cerebral infarction without residual deficits: Secondary | ICD-10-CM | POA: Diagnosis not present

## 2017-05-18 DIAGNOSIS — I2510 Atherosclerotic heart disease of native coronary artery without angina pectoris: Secondary | ICD-10-CM | POA: Diagnosis not present

## 2017-05-18 DIAGNOSIS — I493 Ventricular premature depolarization: Secondary | ICD-10-CM

## 2017-05-18 DIAGNOSIS — I509 Heart failure, unspecified: Secondary | ICD-10-CM | POA: Diagnosis present

## 2017-05-18 LAB — BASIC METABOLIC PANEL
Anion gap: 14 (ref 5–15)
BUN: 23 mg/dL — AB (ref 6–20)
CHLORIDE: 99 mmol/L — AB (ref 101–111)
CO2: 25 mmol/L (ref 22–32)
CREATININE: 0.99 mg/dL (ref 0.61–1.24)
Calcium: 9.2 mg/dL (ref 8.9–10.3)
GFR calc non Af Amer: >60 mL/min (ref >60)
Glucose, Bld: 104 mg/dL — ABNORMAL HIGH (ref 65–99)
Potassium: 4.1 mmol/L (ref 3.5–5.1)
Sodium: 138 mmol/L (ref 135–145)

## 2017-05-18 LAB — BRAIN NATRIURETIC PEPTIDE: B Natriuretic Peptide: 254.6 pg/mL — ABNORMAL HIGH (ref 0.0–100.0)

## 2017-05-18 NOTE — Patient Instructions (Signed)
Your physician has recommended that you wear a holter monitor. Holter monitors are medical devices that record the heart's electrical activity. Doctors most often use these monitors to diagnose arrhythmias. Arrhythmias are problems with the speed or rhythm of the heartbeat. The monitor is a small, portable device. You can wear one while you do your normal daily activities. This is usually used to diagnose what is causing palpitations/syncope (passing out).  (they will call you)  Labs drawn today (if we do not call you, then your lab work was stable)   Your physician recommends that you schedule a follow-up appointment in: 3 months with [name]

## 2017-05-19 NOTE — Progress Notes (Signed)
Patient ID: Caleb Moment, PhD, male   DOB: [birth year], 77 y.o.   MRN: [id number]    Advanced Heart Failure Clinic Note   PCP: [name] EP: [name] Cardiology: [name]  77 yo with complex past history presents for heart failure followup.  Patient had anterior MI in 2004 and developed ischemic cardiomyopathy as well as mitral regurgitation. He also developed atrial fibrillation.  In 10/14, he had MV repair, Maze, CABG with LIMA-LAD, and LA appendage closure at [facility] in [place].  Subsequently, atrial fibrillation returned and he had an atrial fibrillation ablation in [facility] in 3/15.  He wore a Zio patch in 12/15 and had a low atrial fibrillation burden of 7%.  He has had a long-standing ischemic cardiomyopathy.  In 2015, EF was 20-25%.  Echo in 2016 also showed EF 25-30% but estimated PA pressure suggested severe pulmonary hypertension.     At initial appointment, he reported increased exertional dyspnea over a number of weeks.  RHC was done, showing mildly elevated PCWP with moderate pulmonary HTN and low cardiac index (1.93 thermo, 2.13 Fick).  V/Q scan showed no PE.  I started him on digoxin and have titrated his Lasix to 80 mg daily.  He is now on bisoprolol and able to tolerate it.  At last appointment, I had him try replacing valsartan with Entresto 24/26 bid.  He was unable to tolerate it (made him dizzy, though BP was not low).  Therefore, I had him restart valsartan.  CPX in 4/16 showed mildly decreased functional capacity.  He has tolerated Adcirca 20 mg daily.  He did not tolerate an attempt to uptitrate bisoprolol.  He tried CPAP but was unable to tolerate it.  He was unable to tolerate eplerenone 50 mg daily.  Last echo in 9/17 showed EF 20-25%, stable MV repair, normal RV, and PA systolic pressure 86 mmHg.   He had a Lexiscan Cardiolite in 9/17 that showed infarction, no ischemia.  [place] in 10/17 showed severe mixed pulmonary venous HTN/pulmonary arterial HTN with PVR 4.3  WU and PCWP mildly elevated.  Adcirca was increased to 40 mg daily.   At a prior appointment, he was having more palpitations.  Event monitor in 11/17 showed atrial fibrillation episodes, these were symptomatic.  Since then, the atrial fibrillation seems to have decreased.  He saw [name] to discuss Tikosyn initiation.  No decision was reached at that time, and since palpitations have decreased again, he wants to hold off on Tikosyn for now.   He has OSA.  He cannot tolerate CPAP but is using his oral appliance.    HR was lower in 5/18.  He came into the office with HR around 40.  ECG showed an ectopic atrial rhythm with very small P waves. I stopped bisoprolol and later digoxin.  HR has gone up since.  He wore an event monitor in 6/18 showing rare 3-5 second pauses at night only, rare atrial fibrillation, and rare junctional bradycardia.    Since last appointment, he stopped Adcirca as his insurance was no longer covering it.  He has not felt any difference in terms of symptoms so wants to stay off it.   He returns today for followup of CHF.  No dyspnea walking on flat ground, mild dyspnea with stairs or hills.  He has occasional dizzy/lightheaded spells, these usually occur at rest and not with standing or while exerting himself.  No loss of consciousness.  He does not feel palpitations with  these "dizzy spells."   Weight is stable. No chest pain.   ECG (personally reviewed): NSR, 1st degree AV block, IVCD 134 msec  6 minute walk (3/16): 381 m  6 minute walk (5/16): 414.5 m 6 minute walk (10/16): 562 m 6 minute walk (1/17): 469 m 6 minute walk (8/17): 488 m 6 minute walk (6/18): 549 m 6 minute walk (11/18): 366 m  Labs (2/15): LDL 144 Labs (8/15): K 4.6, creatinine 0.9 Labs (12/15): HCT 42.3   Labs (2/16): K 4 => 4.2, creatinine 1.05 => 0.92, BNP 268 Labs (3/16): BNP 495 => 320, digoxin 0.4, RF 14.7 (very mild increase), TSH normal, HIV negative, anti-SCL70 negative, creatinine 0.91, K  4.4 Labs (7/16): K 5, creatinine 1.05, BNP 455, vitamin D normal, digoxin 0.4, B12 normal Labs (8/16): digoxin 0.8, BNP 305, K 4.2, creatinine 0.99 Labs (9/16): HCT 43.3, TSH normal, BNP 492 => 278, K 4.3, creatinine 0.97 => 1.04, digoxin 0.6, TSH normal, LDL 154, LDL-P 1654.  Labs (10/16): K 4.7, creatinine 1.1 Labs (1/17): pro-BNP 2065, digoxin 0.3, K 4.3, creatinine 1.10 => 1.28 Labs (3/17): K 4.1, creatinine 1.09, HCT 38.3 Labs (4/17): K 4.4, creatinine 0.99, digoxin 0.8 Labs (5/17): K 4.3, creatinine 1.08, BNP 317, hgb 13.1 Labs (8/17): K 4.6, creatinine 0.99, BNP 392, digoxin 0.5 Labs (9/17): K 4.4, creatinine 1.05 Labs (10/17): digoxin 0.5, K 4.1, creatinine 1.12, HCT 44.9, digoxin 0.5 Labs (11/17): K 4 => 3.8, creatinine 1.3 => 1.16, digoxin 0.5, BNP 296 Labs (12/17): K 4.1, creatinine 1.15, BNP 294, digoxin 0.7 Labs (2/18): LDL 135, Lp(a) 124, LDL-P 979  Labs (5/18): K 4.3, creatinine 1.07 => 0.95, BNP 224, hgb 14.4, digoxin 0.6 Labs (6/18): K 4.4, creatinine 1.08, hgb 14 Labs (8/18): K 4.1, creatinine 1.05 Labs (12/18): K 3.7, creatinine 0.94  PMH: 1. CAD: Anterior MI in 2004.  Cardiac surgery in 10/14 included LIMA-LAD.  - Lexiscan Cardiolite (9/17): EF 35%, infarct present with no ischemia.  2. Chronic mitral regurgitation: 10/14 surgery at [facility] with MV repair, Maze, LIMA-LAD, and LA appendage closure.  3. Atrial fibrillation: Paroxysmal.  He was initially on Tikosyn but had breakthrough atrial fibrillation.  H/o Maze in 2014.  Had recurrent atrial fibrillation with ablation in 3/15 by Dr Ola Spurr in [facility].  Not anticoagulated after 2 severe prostate bleeding episodes.  LA appendage was oversewn with MV surgery.  - Zio patch in 12/15 with low atrial fibrillation burden (7%).  - Holter (9/16) with PACs, PVCs, short atrial fibrillation runs (nothing sustained). - Event monitor (11/17) with runs of atrial fibrillation and flutter.   - Event monitor (6/18) with rare  atrial fibrillation 4. Chronotropic incompetence.  5. HTN 6. Hyperlipidemia: Refuses statin.  7. Peripheral neuropathy 8. GERD 9. H/o BPPV 10. H/o TIA 11. Ischemic cardiomyopathy: cardiac MRI 7/14 with EF 35%, moderate MR, normal RV size and systolic function, extensive anterior and anteroseptal LGE suggestive of non-viable myocardium (this was prior to LIMA-LAD).  Echo 1/15 with EF 20-25%, moderate AI, PA systolic pressure 39 mmHg. Echo (1/16) with EF 20-25%, diffuse hypokinesis with regionality, moderate LV dilation, moderate AI, s/p MV repair with mild MR and normal gradients, RV dilated with mildly decreased systolic function, PA systolic pressure 71 mmHg.   - RHC (2/16) with mean RA 9, PA 61/25 mean 40, mean PCWP 23, CI 2.13/PVR 4.3 (Fick), CI 1.93/PVR 4.7 (thermo).   - CPX (4/16) with peak VO2 17.9, VE/VCO2 34.7 => mildly decreased functional capacity.  - Spironolactone apparently  caused cognitive deficits - Intolerant of Coreg due to development of severe alopecia - Unable to uptitrate bisoprolol due to intolerance.  - Lightheaded with Entresto.  - Unable to tolerate increase in valsartan to 80 mg bid.  - Echo (1/17) with EF 30-35%, moderate LV dilation, mild AI, s/p MV repair with mild MR, moderately dilated RV with mildly decreased systolic function, PA systolic pressure 69 mmHg.  - CPX (4/17): peak VO2 18, VE/VCO2 slope 33, RER 1.28 => mild to moderate functional impairment, mildly improved.  - Echo (9/17): EF 20-25% with regional WMAs, normal RV size and systolic function, PASP 86 mmHg, stable repaired mitral valve with mild MR, moderate AI.  - RHC (10/17): mean RA 9, PA 70/23 mean 43, PCWP mean 20, CI 2.96 Fick/2.84 Thermo, PVR 4.3 WU.  - Echo (9/18): severe LV dilation, EF 20-25% with WMAs, s/p MV repair with mild MR, moderate AI, moderate TR, PASP 68 mmHg, normal RV size and systolic function.  12. Carotid stenosis: Carotid dopplers (1/16) with 40-59% bilateral ICA stenosis.  Carotid dopplers (4/17) with 40-59% BICA stenosis.  - Carotid dopplers (2/18) with < 50% BICA stenosis.  13. Aortic insufficiency: Moderate by last echo 9/17.  14. Pulmonary HTN: Mixed PAH and pulmonary venous hypertension.  PFTs (9/14) were normal.  V/Q scan (2/16) with no evidence of acute or chronic PE.  6 minute walk (3/16) 381 m. 6 minute walk (5/16) 414.5 m. 6 minute walk (1/17) 469 m.  15. OSA: Moderate on 5/16 sleep study. Unable to tolerate CPAP.  Repeat sleep study at [facility] in 2/18 was also suggestive of OSA.  16. Venous insufficiency 17. Ventral hernia 18. Bradycardia: Holter (5/18) with overnight pauses up to 4.7 sec (likely due to OSA), occasional short atrial tachycardia runs, no atrial fibrillation, avg HR 70s, 3% PVCs.  - Event monitor (6/18): Rare 3-4 second pauses at night, rare atrial fibrillation, rare runs of junctional bradycardia.   SH: Married, lives in [place], Engineer, water, nonsmoker  FH: CAD  ROS: All systems reviewed and negative except as per HPI.   Current Outpatient Medications  Medication Sig Dispense Refill  . aspirin 81 MG chewable tablet Chew 81 mg by mouth daily.    Marland Kitchen eplerenone (INSPRA) 25 MG tablet Take 1 tablet (25 mg total) by mouth daily. 30 tablet 11  . Magnesium 100 MG CAPS Take 1 capsule by mouth 3 (three) times daily.     . Multiple Vitamins-Minerals (MULTIVITAMIN WITH MINERALS) tablet Take 1 tablet by mouth daily.    . Omega-3 Fatty Acids (THE VERY FINEST FISH OIL) LIQD Take 2-3 g by mouth daily.     . potassium chloride (K-DUR,KLOR-CON) 10 MEQ tablet TAKE 2 TABLETS BY MOUTH DAILY. 60 tablet 3  . torsemide (DEMADEX) 20 MG tablet Take 2 tablets (40 mg total) by mouth daily. 60 tablet 3  . valsartan (DIOVAN) 40 MG tablet Take 60 mg (1.5 tab) in AM, then 80 mg (2 tabs) in PM 105 tablet 1  . Zinc 100 MG TABS Take 1 tablet by mouth daily.      No current facility-administered medications for this encounter.    BP (!) 128/52   Pulse 73    Wt 157 lb 4 oz (71.3 kg)   SpO2 97%   BMI 22.56 kg/m    Wt Readings from Last 3 Encounters:  05/18/17 157 lb 4 oz (71.3 kg)  02/22/17 156 lb 6.4 oz (70.9 kg)  02/15/17 156 lb (70.8 kg)    General:  NAD Neck: No JVD, no thyromegaly or thyroid nodule.  Lungs: Clear to auscultation bilaterally with normal respiratory effort. CV: Nondisplaced PMI.  Heart regular S1/S2, no S3/S4, 2/6 early SEM RUSB.  Trace ankle edema.  No carotid bruit.  Normal pedal pulses.  Abdomen: Soft, nontender, no hepatosplenomegaly, no distention.  Skin: Intact without lesions or rashes.  Neurologic: Alert and oriented x 3.  Psych: Normal affect. Extremities: No clubbing or cyanosis.  HEENT: Normal.   Assessment/Plan: 1. CAD: S/p LIMA-LAD.  He is on ASA 81.  He has decided not to take statins after reviewing the data (I did recommend taking a statin but we have agreed to disagree on this, he is taking red yeast rice extract).  Lexiscan Cardiolite was done in 9/17 due to atypical chest pain.  This showed prior infarction with no ischemia.  No recurrence of chest pain since that time despite ongoing exercise.   2. S/p mitral valve repair: The MV repair looked stable on 9/18 echo with no evidence for significant mitral stenosis and mild MR.  3. Aortic insufficiency: Moderate on 9/18 echo.  Repeat echo in 9/18 to follow AI, no diastolic murmur noted on exam.  4. Chronic systolic CHF: Ischemic cardiomyopathy, EF 20-25% on 9/18 echo, stable.  PA pressure remained elevated by doppler measurement but RV systolic function was normal.  Mildly decreased functional capacity on 4/16 CPX, this was mildly improved on 4/17 CPX.  NYHA class II.  He is not volume overloaded on exam, generally feeling well.    - Continue torsemide 40 mg daily, BMET today.  - Bisoprolol and digoxin were both stopped with bradycardia and lightheadedness.   - He did not tolerate Entresto.  Continue valsartan 60 qam/80 qpm, he has not tolerated uptitration.    - Continue eplerenone 25 (unable to tolerate increase).        - ECG reviewed, QRS 134 msec with IVCD.  He asks again about CRT (he does not want an ICD). I do not think that his QRS is wide enough to predict significant benefit, so would not recommend CRT.  He saw [name] as well who agreed with this.  - Repeat echo in 9/19.  5. Atrial fibrillation: s/p Maze in 10/14, then ablation in 3/15.  Prior to Maze, he was on Tikosyn but had breakthrough.  Currently, he is not anticoagulated due to history of prostate bleeding and his choice.  He did have his LA appendage oversewn at time of MV surgery.  Rare palpitations now.  - Off nodal blockers with bradycardia.    - We have had discussions about what to do with his atrial fibrillation.  He is symptomatic when in atrial fibrillation/fluttter.  He had breakthrough on Tikosyn in the past, but has had Maze and atrial fibrillation ablation since that time.  I suggested anticoagulation with Eliquis and re-attempting Tikosyn (cannot tell him definitively that he would be protected enough by the LA appendage clipping alone).  He talked to Dr Caryl Comes about this.  He opted to hold off on Tikosyn.  6. Pulmonary hypertension: Severe by most recent echo in 9/17 and on RHC in 10/17.  RV actually looked ok on 9/17 echo.  Mixed pulmonary venous and pulmonary arterial HTN on RHC.  It is possible that the [facility] component is due to pulmonary vascular remodeling in the setting of chronic mitral regurgitation prior to MV repair. Negative V/Q scan, no evidence for CTEPH.  PFTs normal in 9/14.  He is seeing Dr [place] Bells.  Sleep study showed moderate OSA but he has been unable to tolerate CPAP and is now using an oral device.  - He decided to stop Adcirca as his insurance was not longer covering it.  I offered to help him restart the medication, but he wants to stay off [place] meds for now.  He says that his symptoms are no worse off Adcirca.  He had already refused combination PH therapy.    7. OSA: Moderate.  Has not tolerated CPAP. Probably plays a role in recurrent atrial fibrillation and pulmonary hypertension as well as nocturnal sinus pauses.  He has been using his oral device. 8. Carotid stenosis: 2/18 carotid dopplers at [facility] with < 50% bilateral stenosis.   9. Bradycardia: Has had junctional bradycardia and nocturnal pauses, pauses likely related to OSA.  Now off digoxin and bisoprolol.  He is in NSR today.  Of note, he has been having spells of lightheadedness/dizziness recently.  - I will arrange for a 3 wk monitor to assess for significant bradycardia.    Followup in 3 months.   Loralie Champagne 05/19/2017

## 2017-05-20 MED FILL — EPLERENONE 25 MG TABLET: 25 | 30 days supply | Qty: 30 | Fill #2

## 2017-05-21 DIAGNOSIS — H579 Unspecified disorder of eye and adnexa: Secondary | ICD-10-CM | POA: Diagnosis not present

## 2017-05-21 DIAGNOSIS — H16141 Punctate keratitis, right eye: Secondary | ICD-10-CM | POA: Diagnosis not present

## 2017-05-21 DIAGNOSIS — H2513 Age-related nuclear cataract, bilateral: Secondary | ICD-10-CM | POA: Diagnosis not present

## 2017-05-21 DIAGNOSIS — H43813 Vitreous degeneration, bilateral: Secondary | ICD-10-CM | POA: Diagnosis not present

## 2017-05-21 DIAGNOSIS — H353130 Nonexudative age-related macular degeneration, bilateral, stage unspecified: Secondary | ICD-10-CM | POA: Diagnosis not present

## 2017-05-21 DIAGNOSIS — H353131 Nonexudative age-related macular degeneration, bilateral, early dry stage: Secondary | ICD-10-CM | POA: Diagnosis not present

## 2017-06-04 ENCOUNTER — Other Ambulatory Visit (HOSPITAL_COMMUNITY): Payer: Self-pay | Admitting: Cardiology

## 2017-06-04 MED FILL — TORSEMIDE 20 MG TABLET: 20 | 30 days supply | Qty: 60 | Fill #1

## 2017-06-14 ENCOUNTER — Other Ambulatory Visit (HOSPITAL_COMMUNITY): Payer: Self-pay | Admitting: Cardiology

## 2017-06-15 MED FILL — VALSARTAN 40 MG TABLET: 40 | 30 days supply | Qty: 105 | Fill #0

## 2017-06-18 MED FILL — POTASSIUM CL ER 10 MEQ TABL: 10 | 30 days supply | Qty: 60 | Fill #2

## 2017-06-21 MED FILL — EPLERENONE 25 MG TABLET: 25 | 30 days supply | Qty: 30 | Fill #3

## 2017-07-06 ENCOUNTER — Ambulatory Visit (INDEPENDENT_AMBULATORY_CARE_PROVIDER_SITE_OTHER): Payer: Medicare Other

## 2017-07-06 ENCOUNTER — Other Ambulatory Visit (HOSPITAL_COMMUNITY): Payer: Self-pay | Admitting: Cardiology

## 2017-07-06 DIAGNOSIS — R42 Dizziness and giddiness: Secondary | ICD-10-CM

## 2017-07-06 DIAGNOSIS — I480 Paroxysmal atrial fibrillation: Secondary | ICD-10-CM | POA: Diagnosis not present

## 2017-07-06 DIAGNOSIS — I493 Ventricular premature depolarization: Secondary | ICD-10-CM

## 2017-07-06 DIAGNOSIS — R001 Bradycardia, unspecified: Secondary | ICD-10-CM | POA: Diagnosis not present

## 2017-07-12 MED FILL — TORSEMIDE 10 MG TABLET: 10 | 30 days supply | Qty: 120 | Fill #0

## 2017-07-13 ENCOUNTER — Telehealth (HOSPITAL_COMMUNITY): Payer: Self-pay

## 2017-07-13 NOTE — Telephone Encounter (Signed)
Pt wants to take his monitor off. I spoke with Pt to encourage him to to continue use he states that the lack of sleep will kill him. He is making a decision on terminating wear of monitor and will call if he does.

## 2017-07-13 NOTE — Telephone Encounter (Signed)
Advanced Heart Failure Triage Encounter  Patient Name: Caleb Moment, PhD  Date of Call: 07/13/17  Problem: Arlene from [employer] called to say that the pt had an 3 second p[ause. Pt is SR w/1 degree AV block. She called the pt who was asymptomatic to event.    Plan:    Shirley Muscat, RN

## 2017-07-13 NOTE — Telephone Encounter (Signed)
Continue to follow for now.   Was it nocturnal? Nocturnal bradycardia is known and we are assessing burden with his monitory.    Will discuss with MD.

## 2017-07-22 ENCOUNTER — Telehealth (HOSPITAL_COMMUNITY): Payer: Self-pay

## 2017-07-22 MED FILL — EPLERENONE 25 MG TABLET: 25 | 30 days supply | Qty: 30 | Fill #4

## 2017-07-22 NOTE — Telephone Encounter (Signed)
Pt called due to having a syncope episode and dysthymias on monitor. I have made [name]  Aware no faxes have came in at this time will wait on further orders.

## 2017-08-10 MED FILL — TORSEMIDE 10 MG TABLET: 10 | 30 days supply | Qty: 120 | Fill #1

## 2017-08-10 MED FILL — VALSARTAN 40 MG TABLET: 40 | 30 days supply | Qty: 105 | Fill #1

## 2017-08-12 ENCOUNTER — Telehealth (HOSPITAL_COMMUNITY): Payer: Self-pay | Admitting: *Deleted

## 2017-08-12 NOTE — Telephone Encounter (Signed)
Pt called concerned about a weird feeling he has been having in his head, he states it's like a pressure/tiredness.  He states his BP has been running fine with no issues.  Gave pt event monitor results.  He is already sch to see Dr Aundra Dubin on Tue 5/28, advised him to keep that appt and he was agreeable.

## 2017-08-17 ENCOUNTER — Encounter (HOSPITAL_COMMUNITY): Payer: Self-pay | Admitting: Cardiology

## 2017-08-17 ENCOUNTER — Other Ambulatory Visit: Payer: Self-pay

## 2017-08-17 ENCOUNTER — Ambulatory Visit (HOSPITAL_COMMUNITY)
Admission: RE | Admit: 2017-08-17 | Discharge: 2017-08-17 | Disposition: A | Discharge status: Home / Self Care | Payer: Medicare Other | Source: Ambulatory Visit | Attending: Cardiology | Admitting: Cardiology

## 2017-08-17 VITALS — BP 126/52 | HR 81 | Wt 154.5 lb

## 2017-08-17 DIAGNOSIS — Z9889 Other specified postprocedural states: Secondary | ICD-10-CM | POA: Diagnosis not present

## 2017-08-17 DIAGNOSIS — I6523 Occlusion and stenosis of bilateral carotid arteries: Secondary | ICD-10-CM | POA: Diagnosis not present

## 2017-08-17 DIAGNOSIS — I252 Old myocardial infarction: Secondary | ICD-10-CM | POA: Diagnosis not present

## 2017-08-17 DIAGNOSIS — I2510 Atherosclerotic heart disease of native coronary artery without angina pectoris: Secondary | ICD-10-CM | POA: Insufficient documentation

## 2017-08-17 DIAGNOSIS — E785 Hyperlipidemia, unspecified: Secondary | ICD-10-CM | POA: Diagnosis not present

## 2017-08-17 DIAGNOSIS — Z79899 Other long term (current) drug therapy: Secondary | ICD-10-CM | POA: Diagnosis not present

## 2017-08-17 DIAGNOSIS — I872 Venous insufficiency (chronic) (peripheral): Secondary | ICD-10-CM | POA: Diagnosis not present

## 2017-08-17 DIAGNOSIS — G4733 Obstructive sleep apnea (adult) (pediatric): Secondary | ICD-10-CM | POA: Insufficient documentation

## 2017-08-17 DIAGNOSIS — I480 Paroxysmal atrial fibrillation: Secondary | ICD-10-CM | POA: Diagnosis not present

## 2017-08-17 DIAGNOSIS — Z951 Presence of aortocoronary bypass graft: Secondary | ICD-10-CM

## 2017-08-17 DIAGNOSIS — I110 Hypertensive heart disease with heart failure: Secondary | ICD-10-CM | POA: Diagnosis not present

## 2017-08-17 DIAGNOSIS — I340 Nonrheumatic mitral (valve) insufficiency: Secondary | ICD-10-CM | POA: Diagnosis not present

## 2017-08-17 DIAGNOSIS — Z7982 Long term (current) use of aspirin: Secondary | ICD-10-CM | POA: Diagnosis not present

## 2017-08-17 DIAGNOSIS — I5022 Chronic systolic (congestive) heart failure: Secondary | ICD-10-CM | POA: Insufficient documentation

## 2017-08-17 DIAGNOSIS — Z8249 Family history of ischemic heart disease and other diseases of the circulatory system: Secondary | ICD-10-CM | POA: Insufficient documentation

## 2017-08-17 DIAGNOSIS — I4892 Unspecified atrial flutter: Secondary | ICD-10-CM | POA: Diagnosis not present

## 2017-08-17 DIAGNOSIS — R001 Bradycardia, unspecified: Secondary | ICD-10-CM | POA: Diagnosis not present

## 2017-08-17 DIAGNOSIS — I255 Ischemic cardiomyopathy: Secondary | ICD-10-CM | POA: Diagnosis not present

## 2017-08-17 DIAGNOSIS — I447 Left bundle-branch block, unspecified: Secondary | ICD-10-CM | POA: Insufficient documentation

## 2017-08-17 DIAGNOSIS — I272 Pulmonary hypertension, unspecified: Secondary | ICD-10-CM | POA: Diagnosis not present

## 2017-08-17 DIAGNOSIS — G629 Polyneuropathy, unspecified: Secondary | ICD-10-CM | POA: Insufficient documentation

## 2017-08-17 DIAGNOSIS — Z8673 Personal history of transient ischemic attack (TIA), and cerebral infarction without residual deficits: Secondary | ICD-10-CM | POA: Diagnosis not present

## 2017-08-17 LAB — BASIC METABOLIC PANEL
ANION GAP: 9 (ref 5–15)
BUN: 18 mg/dL (ref 6–20)
CHLORIDE: 100 mmol/L — AB (ref 101–111)
CO2: 29 mmol/L (ref 22–32)
Calcium: 9.3 mg/dL (ref 8.9–10.3)
Creatinine, Ser: 0.98 mg/dL (ref 0.61–1.24)
GFR calc Af Amer: >60 mL/min (ref >60)
Glucose, Bld: 103 mg/dL — ABNORMAL HIGH (ref 65–99)
POTASSIUM: 4 mmol/L (ref 3.5–5.1)
Sodium: 138 mmol/L (ref 135–145)

## 2017-08-17 LAB — LIPID PANEL
CHOL/HDL RATIO: 3.9 ratio
Cholesterol: 212 mg/dL — ABNORMAL HIGH (ref 0–200)
HDL: 55 mg/dL (ref >40)
LDL Cholesterol: 140 mg/dL — ABNORMAL HIGH (ref 0–99)
Triglycerides: 86 mg/dL (ref <150)
VLDL: 17 mg/dL (ref 0–40)

## 2017-08-17 LAB — HEMOGLOBIN A1C
Hgb A1c MFr Bld: 5.5 % (ref 4.8–5.6)
MEAN PLASMA GLUCOSE: 111.15 mg/dL

## 2017-08-17 NOTE — Patient Instructions (Addendum)
Labs drawn today (if we do not call you, then your lab work was stable)   Your physician has requested that you have an echocardiogram. Echocardiography is a painless test that uses sound waves to create images of your heart. It provides your doctor with information about the size and shape of your heart and how well your heart's chambers and valves are working. This procedure takes approximately one hour. There are no restrictions for this procedure.   Your physician recommends that you schedule a follow-up appointment in: 4 months with [name] an a echocardiogram

## 2017-08-18 ENCOUNTER — Encounter (HOSPITAL_COMMUNITY): Payer: Self-pay

## 2017-08-18 LAB — HOMOCYSTEINE: HOMOCYSTEINE-NORM: 15 umol/L (ref 0.0–15.0)

## 2017-08-18 NOTE — Progress Notes (Signed)
Patient ID: Caleb Moment, PhD, male   DOB: 1940/04/22, 77 y.o.   MRN: [id number]    Advanced Heart Failure Clinic Note   PCP: [name] EP: [name] Cardiology: [name]  77 yo with complex past history presents for heart failure followup.  Patient had anterior MI in 2004 and developed ischemic cardiomyopathy as well as mitral regurgitation. He also developed atrial fibrillation.  In 10/14, he had MV repair, Maze, CABG with LIMA-LAD, and LA appendage closure at [facility] in [place].  Subsequently, atrial fibrillation returned and he had an atrial fibrillation ablation in [facility] in 3/15.  He wore a Zio patch in 12/15 and had a low atrial fibrillation burden of 7%.  He has had a long-standing ischemic cardiomyopathy.  In 2015, EF was 20-25%.  Echo in 2016 also showed EF 25-30% but estimated PA pressure suggested severe pulmonary hypertension.     At initial appointment, he reported increased exertional dyspnea over a number of weeks.  RHC was done, showing mildly elevated PCWP with moderate pulmonary HTN and low cardiac index (1.93 thermo, 2.13 Fick).  V/Q scan showed no PE.  I started him on digoxin and have titrated his Lasix to 80 mg daily.  He is now on bisoprolol and able to tolerate it.  At last appointment, I had him try replacing valsartan with Entresto 24/26 bid.  He was unable to tolerate it (made him dizzy, though BP was not low).  Therefore, I had him restart valsartan.  CPX in 4/16 showed mildly decreased functional capacity.  He has tolerated Adcirca 20 mg daily.  He did not tolerate an attempt to uptitrate bisoprolol.  He tried CPAP but was unable to tolerate it.  He was unable to tolerate eplerenone 50 mg daily.  Last echo in 9/17 showed EF 20-25%, stable MV repair, normal RV, and PA systolic pressure 86 mmHg.   He had a Lexiscan Cardiolite in 9/17 that showed infarction, no ischemia.  [place] in 10/17 showed severe mixed pulmonary venous HTN/pulmonary arterial HTN with PVR 4.3  WU and PCWP mildly elevated.  Adcirca was increased to 40 mg daily.   At a prior appointment, he was having more palpitations.  Event monitor in 11/17 showed atrial fibrillation episodes, these were symptomatic.  Since then, the atrial fibrillation seems to have decreased.  He saw [name] to discuss Tikosyn initiation.  No decision was reached at that time, and since palpitations have decreased again, he wants to hold off on Tikosyn for now.   He has OSA.  He cannot tolerate CPAP but is using his oral appliance.    HR was lower in 5/18.  He came into the office with HR around 40.  ECG showed an ectopic atrial rhythm with very small P waves. I stopped bisoprolol and later digoxin.  HR has gone up since.  He wore an event monitor in 6/18 showing rare 3-5 second pauses at night only, rare atrial fibrillation, and rare junctional bradycardia.  He wore an event monitor again in 4/19, this showed rare afib (1% total) with nocturnal bradycardia and 1 nocturnal 3 second pause.  No concerning findings.   He stopped Adcirca as his insurance was no longer covering it.  He has not felt any difference in terms of symptoms so wants to stay off it.   He returns today for followup of CHF.  Cardiac symptoms are stable.  No chest pain.  Generally no dyspnea walking on flat ground, some dyspnea walking  fast up inclines.  No orthopnea/PND.  Feels palpitations rarely.    He does continue to have episodes where he will suddenly feel "faint" and lightheaded.  These episodes resolve relatively quickly.  They seem to occur when he is watching a screen (computer or TV) and is at rest.  He does not feel lightheaded while walking or standing.  Episodes do not correlate with arrhythmias on event monitor.  Episodes do not seem like vertigo to him.   ECG (personally reviewed): NSR, 1st degree AV block, old anterior MI, IVCD (LBBB-like) 140 msec.   6 minute walk (3/16): 381 m  6 minute walk (5/16): 414.5 m 6 minute walk  (10/16): 562 m 6 minute walk (1/17): 469 m 6 minute walk (8/17): 488 m 6 minute walk (6/18): 549 m 6 minute walk (11/18): 366 m  Labs (2/15): LDL 144 Labs (8/15): K 4.6, creatinine 0.9 Labs (12/15): HCT 42.3   Labs (2/16): K 4 => 4.2, creatinine 1.05 => 0.92, BNP 268 Labs (3/16): BNP 495 => 320, digoxin 0.4, RF 14.7 (very mild increase), TSH normal, HIV negative, anti-SCL70 negative, creatinine 0.91, K 4.4 Labs (7/16): K 5, creatinine 1.05, BNP 455, vitamin D normal, digoxin 0.4, B12 normal Labs (8/16): digoxin 0.8, BNP 305, K 4.2, creatinine 0.99 Labs (9/16): HCT 43.3, TSH normal, BNP 492 => 278, K 4.3, creatinine 0.97 => 1.04, digoxin 0.6, TSH normal, LDL 154, LDL-P 1654.  Labs (10/16): K 4.7, creatinine 1.1 Labs (1/17): pro-BNP 2065, digoxin 0.3, K 4.3, creatinine 1.10 => 1.28 Labs (3/17): K 4.1, creatinine 1.09, HCT 38.3 Labs (4/17): K 4.4, creatinine 0.99, digoxin 0.8 Labs (5/17): K 4.3, creatinine 1.08, BNP 317, hgb 13.1 Labs (8/17): K 4.6, creatinine 0.99, BNP 392, digoxin 0.5 Labs (9/17): K 4.4, creatinine 1.05 Labs (10/17): digoxin 0.5, K 4.1, creatinine 1.12, HCT 44.9, digoxin 0.5 Labs (11/17): K 4 => 3.8, creatinine 1.3 => 1.16, digoxin 0.5, BNP 296 Labs (12/17): K 4.1, creatinine 1.15, BNP 294, digoxin 0.7 Labs (2/18): LDL 135, Lp(a) 124, LDL-P 979  Labs (5/18): K 4.3, creatinine 1.07 => 0.95, BNP 224, hgb 14.4, digoxin 0.6 Labs (6/18): K 4.4, creatinine 1.08, hgb 14 Labs (8/18): K 4.1, creatinine 1.05 Labs (12/18): K 3.7, creatinine 0.94 Labs (2/19): K 4.1, creatinine 0.99, BNP 255  PMH: 1. CAD: Anterior MI in 2004.  Cardiac surgery in 10/14 included LIMA-LAD.  - Lexiscan Cardiolite (9/17): EF 35%, infarct present with no ischemia.  2. Chronic mitral regurgitation: 10/14 surgery at [facility] with MV repair, Maze, LIMA-LAD, and LA appendage closure.  3. Atrial fibrillation: Paroxysmal.  He was initially on Tikosyn but had breakthrough atrial fibrillation.  H/o Maze  in 2014.  Had recurrent atrial fibrillation with ablation in 3/15 by Dr Ola Spurr in [facility].  Not anticoagulated after 2 severe prostate bleeding episodes.  LA appendage was oversewn with MV surgery.  - Zio patch in 12/15 with low atrial fibrillation burden (7%).  - Holter (9/16) with PACs, PVCs, short atrial fibrillation runs (nothing sustained). - Event monitor (11/17) with runs of atrial fibrillation and flutter.   - Event monitor (6/18) with rare atrial fibrillation - Event monitor 4/19 showing rare afib (1% total) with nocturnal bradycardia and 1 nocturnal 3 second pause.  No concerning findings.  4. Chronotropic incompetence.  5. HTN 6. Hyperlipidemia: Refuses statin.  7. Peripheral neuropathy 8. GERD 9. H/o BPPV 10. H/o TIA 11. Ischemic cardiomyopathy: cardiac MRI 7/14 with EF 35%, moderate MR, normal RV size and systolic function, extensive  anterior and anteroseptal LGE suggestive of non-viable myocardium (this was prior to LIMA-LAD).  Echo 1/15 with EF 20-25%, moderate AI, PA systolic pressure 39 mmHg. Echo (1/16) with EF 20-25%, diffuse hypokinesis with regionality, moderate LV dilation, moderate AI, s/p MV repair with mild MR and normal gradients, RV dilated with mildly decreased systolic function, PA systolic pressure 71 mmHg.   - RHC (2/16) with mean RA 9, PA 61/25 mean 40, mean PCWP 23, CI 2.13/PVR 4.3 (Fick), CI 1.93/PVR 4.7 (thermo).   - CPX (4/16) with peak VO2 17.9, VE/VCO2 34.7 => mildly decreased functional capacity.  - Spironolactone apparently caused cognitive deficits - Intolerant of Coreg due to development of severe alopecia - Unable to uptitrate bisoprolol due to intolerance.  - Lightheaded with Entresto.  - Unable to tolerate increase in valsartan to 80 mg bid.  - Echo (1/17) with EF 30-35%, moderate LV dilation, mild AI, s/p MV repair with mild MR, moderately dilated RV with mildly decreased systolic function, PA systolic pressure 69 mmHg.  - CPX (4/17): peak  VO2 18, VE/VCO2 slope 33, RER 1.28 => mild to moderate functional impairment, mildly improved.  - Echo (9/17): EF 20-25% with regional WMAs, normal RV size and systolic function, PASP 86 mmHg, stable repaired mitral valve with mild MR, moderate AI.  - RHC (10/17): mean RA 9, PA 70/23 mean 43, PCWP mean 20, CI 2.96 Fick/2.84 Thermo, PVR 4.3 WU.  - Echo (9/18): severe LV dilation, EF 20-25% with WMAs, s/p MV repair with mild MR, moderate AI, moderate TR, PASP 68 mmHg, normal RV size and systolic function.  12. Carotid stenosis: Carotid dopplers (1/16) with 40-59% bilateral ICA stenosis. Carotid dopplers (4/17) with 40-59% BICA stenosis.  - Carotid dopplers (2/18) with < 50% BICA stenosis.  13. Aortic insufficiency: Moderate by last echo 9/17.  14. Pulmonary HTN: Mixed PAH and pulmonary venous hypertension.  PFTs (9/14) were normal.  V/Q scan (2/16) with no evidence of acute or chronic PE.  6 minute walk (3/16) 381 m. 6 minute walk (5/16) 414.5 m. 6 minute walk (1/17) 469 m.  15. OSA: Moderate on 5/16 sleep study. Unable to tolerate CPAP.  Repeat sleep study at [facility] in 2/18 was also suggestive of OSA.  16. Venous insufficiency 17. Ventral hernia 18. Bradycardia: Holter (5/18) with overnight pauses up to 4.7 sec (likely due to OSA), occasional short atrial tachycardia runs, no atrial fibrillation, avg HR 70s, 3% PVCs.  - Event monitor (6/18): Rare 3-4 second pauses at night, rare atrial fibrillation, rare runs of junctional bradycardia.   SH: Married, lives in [place], Engineer, water, nonsmoker  FH: CAD  ROS: All systems reviewed and negative except as per HPI.   Current Outpatient Medications  Medication Sig Dispense Refill  . aspirin 81 MG chewable tablet Chew 81 mg by mouth daily.    Marland Kitchen eplerenone (INSPRA) 25 MG tablet Take 1 tablet (25 mg total) by mouth daily. 30 tablet 11  . Magnesium 100 MG CAPS Take 1 capsule by mouth 3 (three) times daily.     . Multiple Vitamins-Minerals  (MULTIVITAMIN WITH MINERALS) tablet Take 1 tablet by mouth daily.    . Omega-3 Fatty Acids (THE VERY FINEST FISH OIL) LIQD Take 2-3 g by mouth daily.     . potassium chloride (K-DUR,KLOR-CON) 10 MEQ tablet TAKE 2 TABLETS BY MOUTH DAILY. 60 tablet 3  . torsemide (DEMADEX) 20 MG tablet Take 2 tablets (40 mg total) by mouth daily. 60 tablet 3  . valsartan (DIOVAN) 40 MG  tablet TAKE 1 AND 1/2 TABLETS BY MOUTH IN THE MORNING AND 2 TABLETS IN THE EVENING 105 tablet 1  . Zinc 100 MG TABS Take 1 tablet by mouth daily.      No current facility-administered medications for this encounter.    BP (!) 126/52   Pulse 81   Wt 154 lb 8 oz (70.1 kg)   SpO2 99%   BMI 22.17 kg/m    Wt Readings from Last 3 Encounters:  08/17/17 154 lb 8 oz (70.1 kg)  05/18/17 157 lb 4 oz (71.3 kg)  02/22/17 156 lb 6.4 oz (70.9 kg)    General: NAD Neck: No JVD, no thyromegaly or thyroid nodule.  Lungs: Clear to auscultation bilaterally with normal respiratory effort. CV: Nondisplaced PMI.  Heart regular S1/S2, no S3/S4, 2/6 SEM RUSB.  No peripheral edema.  No carotid bruit.  Normal pedal pulses.  Abdomen: Soft, nontender, no hepatosplenomegaly, no distention.  Skin: Intact without lesions or rashes.  Neurologic: Alert and oriented x 3.  Psych: Normal affect. Extremities: No clubbing or cyanosis.  HEENT: Normal.   Assessment/Plan: 1. CAD: S/p LIMA-LAD.  He is on ASA 81.  He has decided not to take statins after reviewing the data (I did recommend taking a statin but we have agreed to disagree on this, he is taking red yeast rice extract).  Lexiscan Cardiolite was done in 9/17 due to atypical chest pain.  This showed prior infarction with no ischemia.  No recurrence of chest pain since that time despite ongoing exercise.   2. S/p mitral valve repair: The MV repair looked stable on 9/18 echo with no evidence for significant mitral stenosis and mild MR.  - Repeat echo in 9/19.  3. Aortic insufficiency: Moderate on 9/18  echo.  Repeat echo in 9/19 to follow AI, no diastolic murmur noted on exam.  4. Chronic systolic CHF: Ischemic cardiomyopathy, EF 20-25% on 9/18 echo, stable.  PA pressure remained elevated by doppler measurement but RV systolic function was normal.  Mildly decreased functional capacity on 4/16 CPX, this was mildly improved on 4/17 CPX.  NYHA class II.  He is not volume overloaded on exam, generally feeling well.    - Continue torsemide 40 mg daily, BMET today.  - Bisoprolol and digoxin were both stopped with bradycardia and lightheadedness.   - He did not tolerate Entresto.  Continue valsartan 60 qam/80 qpm, he has not tolerated uptitration.   - Continue eplerenone 25 (unable to tolerate increase).        - ECG reviewed, QRS 140 msec with LBBB-like IVCD.  He asks again about CRT (he does not want an ICD). QRS is a little wider today but not markedly wide (< 150 msec). Will review with [name] but probably not a lot of benefit from CRT here.   - Repeat echo in 9/19.  5. Atrial fibrillation: s/p Maze in 10/14, then ablation in 3/15.  Prior to Maze, he was on Tikosyn but had breakthrough.  Currently, he is not anticoagulated due to history of prostate bleeding and his choice.  He did have his LA appendage oversewn at time of MV surgery.  Rare palpitations now.  1% atrial fibrillation on 4/19 event monitor.  - Off nodal blockers with bradycardia.    - We have had discussions about what to do with his atrial fibrillation.  He is symptomatic when in atrial fibrillation/fluttter.  He had breakthrough on Tikosyn in the past, but has had Maze and atrial fibrillation  ablation since that time.  I suggested anticoagulation with Eliquis and re-attempting Tikosyn (cannot tell him definitively that he would be protected enough by the LA appendage clipping alone).  He talked to Dr Caryl Comes about this.  He opted to hold off on Tikosyn and currently feels like the atrial fibrillation burden is manageable.  6. Pulmonary  hypertension: Severe by most recent echo in 9/17 and on RHC in 10/17.  RV actually looked ok on 9/17 echo.  Mixed pulmonary venous and pulmonary arterial HTN on RHC.  It is possible that the [facility] component is due to pulmonary vascular remodeling in the setting of chronic mitral regurgitation prior to MV repair. Negative V/Q scan, no evidence for CTEPH.  PFTs normal in 9/14.  He is seeing Dr Lake Bells.  Sleep study showed moderate OSA but he has been unable to tolerate CPAP and is now using an oral device.  - He decided to stop Adcirca as his insurance was not longer covering it.  I offered to help him restart the medication, but he wants to stay off [place] meds for now.  He says that his symptoms are no worse off Adcirca.  He had already refused combination PH therapy.  7. OSA: Moderate.  Has not tolerated CPAP. Probably plays a role in recurrent atrial fibrillation and pulmonary hypertension as well as nocturnal sinus pauses.  He has been using his oral device. 8. Carotid stenosis: 2/18 carotid dopplers at [facility] with < 50% bilateral stenosis.   9. Bradycardia: Nocturnal bradycardia likely related to OSA.  Now off digoxin and bisoprolol.  He is in NSR today.  10. Dizzy spells: He reports lightheadedness generally when sitting down looking at a screen (computer, TV). He does not have orthostatic symptoms (no dizziness while standing).  Dizzy spells did not correlate to arrhythmias on event monitor.  Episodes do not seem like positional vertigo.  - If symptoms do not resolve, neurology evaluation would be reasonable.   Followup in 4 months with echo.   Loralie Champagne 08/18/2017

## 2017-08-19 MED FILL — EPLERENONE 25 MG TABLET: 25 | 30 days supply | Qty: 30 | Fill #5

## 2017-08-23 ENCOUNTER — Other Ambulatory Visit (HOSPITAL_COMMUNITY): Payer: Self-pay | Admitting: Cardiology

## 2017-08-24 ENCOUNTER — Encounter: Payer: Self-pay | Admitting: Internal Medicine

## 2017-08-24 ENCOUNTER — Ambulatory Visit (INDEPENDENT_AMBULATORY_CARE_PROVIDER_SITE_OTHER): Payer: Medicare Other | Admitting: Internal Medicine

## 2017-08-24 VITALS — BP 122/60 | HR 68 | Ht 70.0 in | Wt 156.4 lb

## 2017-08-24 DIAGNOSIS — I480 Paroxysmal atrial fibrillation: Secondary | ICD-10-CM | POA: Diagnosis not present

## 2017-08-24 DIAGNOSIS — I5022 Chronic systolic (congestive) heart failure: Secondary | ICD-10-CM

## 2017-08-24 NOTE — Patient Instructions (Signed)
Medication Instructions:  Your physician recommends that you continue on your current medications as directed. Please refer to the Current Medication list given to you today.  Labwork: None ordered.  Testing/Procedures: None ordered.  Follow-Up: Your physician recommends that you schedule a follow-up appointment in: One year   Any Other Special Instructions Will Be Listed Below (If Applicable).     If you need a refill on your cardiac medications before your next appointment, please call your pharmacy.

## 2017-08-24 NOTE — Progress Notes (Signed)
Patient Care Team: Marin Olp, MD as PCP - General (Family Medicine) Elsie Stain, MD (Pulmonary Disease) Deboraha Sprang, MD as Consulting Physician (Cardiology)   HPI  Caleb Moment, Caleb [name] is a 77 y.o. male Seen in followup for atrial arrhythmias in the context of long-standing atrial fibrillation. He has a history of ischemic heart disease dating back to 2004 when he presented with an anterior wall MI. He had progressive mitral regurgitation and a dilated aortic root and he went to [place] underwent mitral valve repair aortic root surgery and a CryoMaze He also had a single vessel bypass with a LIMA to his LAD Echocardiogram 1/15 it demonstrated ejection fraction 20-25% with moderate AR mild MR with a prosthestic ring.   Subsequently, atrial fibrillation returned and he had an atrial fibrillation ablation in [facility] in 3/15. He wore a Zio patch in 12/15 and had a low atrial fibrillation burden of 7%. He has had a long-standing ischemic cardiomyopathy. In 2015, EF was 20-25%. Echo in 2016 also showed EF 25-30% but estimated PA pressure suggested severe pulmonary hypertension, which is new for him. 9/18 Echo EF 20-25% mod AI   He has been following up in the heart failure clinic. Right heart catheterization about a year ago demonstrated moderate pulmonary hypertension and a low cardiac index about 2. Digoxin was started. Entresto was not tolerated. CPAP was not tolerated. Echo 1/17 demonstrated some interval improvement in LVEF at 30-35 with again moderate pulmonary hypertension 69 mm with a moderately dilated RV  90/17 Myoview no ischemia 10/17 right heart catheterization severe pulmonary hypertension of mixed variety.  He was seen a couple of weeks ago and found to be recurrent atypical atrial flutter.   He has not been on anticoagulation as he had atrial appendage oversewing.  Discussions re Phyllis Ginger were broached but not consummated   He saw Dr. DM last  week.  Issue again has been raised as to whether he should be considered for CRT.  ECGs were reviewed from 5/19 and 2/19.  QRS duration is 134-140 with an IVCD pattern with a left bundle branch block like pattern but without midsternal notching  He has had bradycardia which prompted the discontinuation of beta-blockers and digoxin.  Hypotension has precluded up titration of other guideline directed medical therapy  He has days where he just does not feel good.  It is not clearly related to atrial fibrillation; indeed recent monitoring was reviewed and demonstrated less than 1% atrial fibrillation.  Past Medical History:  Diagnosis Date  . Allergic rhinitis   . Atrial fibrillation ([place])   . Atypical pneumonia   . CAD (coronary artery disease)   . Cardiomyopathy, ischemic   . Chronic anticoagulation   . Cough   . Dizziness   . GERD (gastroesophageal reflux disease)   . Heart failure, systolic, acute on chronic ([place])   . Hyperlipidemia   . Hypertension   . OSA (obstructive sleep apnea)    Home sleep test 07/05/2009 AHI 8.2  . Pleural effusion   . Positional vertigo   . TIA (transient ischemic attack)     Past Surgical History:  Procedure Laterality Date  . AORTIC VALVE REPAIR  01/09/13  . CARDIAC CATHETERIZATION N/A 01/10/2016   Procedure: Right Heart Cath;  Surgeon: Larey Dresser, MD;  Location: [facility] CV LAB;  Service: Cardiovascular;  Laterality: N/A;  . CORONARY ARTERY BYPASS GRAFT  01/09/13   LAD LIMA, left atrial appendage  .  CORONARY STENT PLACEMENT  2004   LAD  . MITRAL VALVE ANNULOPLASTY  01/09/13  . RIGHT HEART CATHETERIZATION N/A 05/11/2014   Procedure: RIGHT HEART CATH;  Surgeon: Larey Dresser, MD;  Location[facility] CATH LAB;  Service: Cardiovascular;  Laterality: N/A;  . SHOULDER SURGERY    . TEE WITHOUT CARDIOVERSION N/A 09/21/2012   Procedure: TRANSESOPHAGEAL ECHOCARDIOGRAM (TEE);  Surgeon: Larey Dresser, MD;  Location: [facility] ENDOSCOPY;  Service: Cardiovascular;   Laterality: N/A;    Current Outpatient Medications  Medication Sig Dispense Refill  . aspirin 81 MG chewable tablet Chew 81 mg by mouth daily.    Marland Kitchen eplerenone (INSPRA) 25 MG tablet Take 1 tablet (25 mg total) by mouth daily. 30 tablet 11  . magnesium gluconate (MAGONATE) 500 MG tablet Take 500 mg by mouth 2 (two) times daily. Take 2 tablets (500 mg)  by mouth daily for a total of 1000 mg daily.    . Multiple Vitamins-Minerals (MULTIVITAMIN WITH MINERALS) tablet Take 1 tablet by mouth daily.    . Omega-3 Fatty Acids (THE VERY FINEST FISH OIL) LIQD Take 2-3 g by mouth daily.     . potassium chloride (K-DUR,KLOR-CON) 10 MEQ tablet TAKE 2 TABLETS BY MOUTH DAILY. 60 tablet 3  . torsemide (DEMADEX) 10 MG tablet TAKE 4 TABLETS BY MOUTH DAILY 120 tablet 1  . torsemide (DEMADEX) 20 MG tablet Take 2 tablets (40 mg total) by mouth daily. 60 tablet 3  . valsartan (DIOVAN) 40 MG tablet TAKE 1 AND 1/2 TABLETS BY MOUTH IN THE MORNING AND 2 TABLETS IN THE EVENING 105 tablet 1  . Zinc 100 MG TABS Take 1 tablet by mouth daily.      No current facility-administered medications for this visit.     Allergies  Allergen Reactions  . Carvedilol Other (See Comments)    Dizziness, uneasiness, alopecia  . Dabigatran Rash  . Pradaxa [Dabigatran Etexilate Mesylate] Rash  . Sulfonamide Derivatives Rash    REACTION: rash  . Xarelto [Rivaroxaban] Rash    Review of Systems negative except from HPI and PMH  Physical Exam BP 122/60   Pulse 68   Ht _0  (1.778 m)   Wt 156 lb 6.4 oz (70.9 kg)   SpO2 99%   BMI 22.44 kg/m  Well developed and nourished in no acute distress HENT normal Neck supple with JVP-flat Clear Regular rate and rhythm, no murmurs or gallops Abd-soft with active BS No Clubbing cyanosis edema Skin-warm and dry A & Oriented  Grossly normal sensory and motor function  ECG demonstrates sinus rhythm with an IVCD QRS duration is 136 ms  Assessment and  Plan  Atrial  flutter/fibrillation  MV repair Cryo Maze  Chronic systolic heart failure   Ischemic/nonischemic cardiomyopathy with prior bypass  Atrial oversewing  Bradycardia   IVCD  The issue currently again is whether he is a candidate for CRT.  His QRS duration remains in the 130-140 range.  He has an IVCD which while left bundle branch block like fails to have significant fragmentation or fractionation in the precordial leads.  Hence, he has no good predictors for benefit from resynchronization.  He and his wife both asked the questions what does he do next.  His atrial fibrillation burden has been very small.  Hopefully will stay small.  I also mentioned that his heart has survived a great deal of illness over many years.  There are limits I think to what we will be able to do to help  him going forward.  He and his wife both acknowledge a great deal of stress related to both of their health  We spent more than 50% of our >45 min visit in face to face counseling regarding the above

## 2017-09-09 MED FILL — POTASSIUM CL ER 10 MEQ TABL: 10 | 30 days supply | Qty: 60 | Fill #3

## 2017-09-20 MED FILL — TORSEMIDE 10 MG TABLET: 10 | 30 days supply | Qty: 120 | Fill #0

## 2017-09-20 MED FILL — EPLERENONE 25 MG TABLET: 25 | 30 days supply | Qty: 30 | Fill #6

## 2017-10-07 ENCOUNTER — Ambulatory Visit (HOSPITAL_BASED_OUTPATIENT_CLINIC_OR_DEPARTMENT_OTHER)
Admission: RE | Admit: 2017-10-07 | Discharge: 2017-10-07 | Disposition: A | Discharge status: Home / Self Care | Payer: Medicare Other | Source: Ambulatory Visit | Attending: Cardiology | Admitting: Cardiology

## 2017-10-07 ENCOUNTER — Ambulatory Visit (HOSPITAL_COMMUNITY)
Admission: RE | Admit: 2017-10-07 | Discharge: 2017-10-07 | Disposition: A | Discharge status: Home / Self Care | Payer: Medicare Other | Source: Ambulatory Visit | Attending: Family Medicine | Admitting: Family Medicine

## 2017-10-07 ENCOUNTER — Encounter (HOSPITAL_COMMUNITY): Payer: Self-pay | Admitting: Cardiology

## 2017-10-07 VITALS — BP 143/54 | HR 79 | Wt 154.8 lb

## 2017-10-07 DIAGNOSIS — I6523 Occlusion and stenosis of bilateral carotid arteries: Secondary | ICD-10-CM | POA: Insufficient documentation

## 2017-10-07 DIAGNOSIS — I4892 Unspecified atrial flutter: Secondary | ICD-10-CM | POA: Diagnosis not present

## 2017-10-07 DIAGNOSIS — I110 Hypertensive heart disease with heart failure: Secondary | ICD-10-CM | POA: Insufficient documentation

## 2017-10-07 DIAGNOSIS — Z8673 Personal history of transient ischemic attack (TIA), and cerebral infarction without residual deficits: Secondary | ICD-10-CM | POA: Insufficient documentation

## 2017-10-07 DIAGNOSIS — H8110 Benign paroxysmal vertigo, unspecified ear: Secondary | ICD-10-CM | POA: Insufficient documentation

## 2017-10-07 DIAGNOSIS — R002 Palpitations: Secondary | ICD-10-CM | POA: Diagnosis not present

## 2017-10-07 DIAGNOSIS — I5022 Chronic systolic (congestive) heart failure: Secondary | ICD-10-CM | POA: Diagnosis not present

## 2017-10-07 DIAGNOSIS — I480 Paroxysmal atrial fibrillation: Secondary | ICD-10-CM

## 2017-10-07 DIAGNOSIS — E785 Hyperlipidemia, unspecified: Secondary | ICD-10-CM

## 2017-10-07 DIAGNOSIS — I255 Ischemic cardiomyopathy: Secondary | ICD-10-CM | POA: Insufficient documentation

## 2017-10-07 DIAGNOSIS — I083 Combined rheumatic disorders of mitral, aortic and tricuspid valves: Secondary | ICD-10-CM | POA: Insufficient documentation

## 2017-10-07 DIAGNOSIS — Z951 Presence of aortocoronary bypass graft: Secondary | ICD-10-CM | POA: Diagnosis not present

## 2017-10-07 DIAGNOSIS — Z79899 Other long term (current) drug therapy: Secondary | ICD-10-CM | POA: Insufficient documentation

## 2017-10-07 DIAGNOSIS — I272 Pulmonary hypertension, unspecified: Secondary | ICD-10-CM | POA: Diagnosis not present

## 2017-10-07 DIAGNOSIS — G629 Polyneuropathy, unspecified: Secondary | ICD-10-CM | POA: Diagnosis not present

## 2017-10-07 DIAGNOSIS — I2510 Atherosclerotic heart disease of native coronary artery without angina pectoris: Secondary | ICD-10-CM | POA: Insufficient documentation

## 2017-10-07 DIAGNOSIS — I454 Nonspecific intraventricular block: Secondary | ICD-10-CM | POA: Diagnosis not present

## 2017-10-07 DIAGNOSIS — Z8249 Family history of ischemic heart disease and other diseases of the circulatory system: Secondary | ICD-10-CM | POA: Insufficient documentation

## 2017-10-07 DIAGNOSIS — Z9889 Other specified postprocedural states: Secondary | ICD-10-CM

## 2017-10-07 DIAGNOSIS — I252 Old myocardial infarction: Secondary | ICD-10-CM | POA: Insufficient documentation

## 2017-10-07 DIAGNOSIS — I340 Nonrheumatic mitral (valve) insufficiency: Secondary | ICD-10-CM | POA: Insufficient documentation

## 2017-10-07 DIAGNOSIS — R001 Bradycardia, unspecified: Secondary | ICD-10-CM | POA: Insufficient documentation

## 2017-10-07 DIAGNOSIS — G4733 Obstructive sleep apnea (adult) (pediatric): Secondary | ICD-10-CM | POA: Insufficient documentation

## 2017-10-07 DIAGNOSIS — I2721 Secondary pulmonary arterial hypertension: Secondary | ICD-10-CM | POA: Insufficient documentation

## 2017-10-07 LAB — BASIC METABOLIC PANEL
ANION GAP: 8 (ref 5–15)
BUN: 15 mg/dL (ref 8–23)
CO2: 29 mmol/L (ref 22–32)
Calcium: 9.4 mg/dL (ref 8.9–10.3)
Chloride: 99 mmol/L (ref 98–111)
Creatinine, Ser: 0.93 mg/dL (ref 0.61–1.24)
Glucose, Bld: 105 mg/dL — ABNORMAL HIGH (ref 70–99)
Potassium: 4.4 mmol/L (ref 3.5–5.1)
Sodium: 136 mmol/L (ref 135–145)

## 2017-10-07 LAB — LIPID PANEL
CHOL/HDL RATIO: 3.9 ratio
Cholesterol: 206 mg/dL — ABNORMAL HIGH (ref 0–200)
HDL: 53 mg/dL (ref >40)
LDL CALC: 136 mg/dL — AB (ref 0–99)
TRIGLYCERIDES: 84 mg/dL (ref <150)
VLDL: 17 mg/dL (ref 0–40)

## 2017-10-07 LAB — BRAIN NATRIURETIC PEPTIDE: B Natriuretic Peptide: 426.7 pg/mL — ABNORMAL HIGH (ref 0.0–100.0)

## 2017-10-07 NOTE — Patient Instructions (Signed)
Labs done today  Your physician has recommended that you have a cardiopulmonary stress test (CPX). CPX testing is a non-invasive measurement of heart and lung function. It replaces a traditional treadmill stress test. This type of test provides a tremendous amount of information that relates not only to your present condition but also for future outcomes. This test combines measurements of you ventilation, respiratory gas exchange in the lungs, electrocardiogram (EKG), blood pressure and physical response before, during, and following an exercise protocol.  You have been referred to cardiac rehab, they will call you to schedule  Please think about having a Right Heart Catheterization  Your physician recommends that you schedule a follow-up appointment in: 2 months

## 2017-10-07 NOTE — Progress Notes (Signed)
Patient ID: Caleb Moment, PhD, male   DOB: 1940-08-27, 77 y.o.   MRN: [id number]    Advanced Heart Failure Clinic Note   PCP: [name] EP: [name] Cardiology: [name]  77 yo with complex past history presents for heart failure followup.  Patient had anterior MI in 2004 and developed ischemic cardiomyopathy as well as mitral regurgitation. He also developed atrial fibrillation.  In 10/14, he had MV repair, Maze, CABG with LIMA-LAD, and LA appendage closure at [facility] in [place].  Subsequently, atrial fibrillation returned and he had an atrial fibrillation ablation in [facility] in 3/15.  He wore a Zio patch in 12/15 and had a low atrial fibrillation burden of 7%.  He has had a long-standing ischemic cardiomyopathy.  In 2015, EF was 20-25%.  Echo in 2016 also showed EF 25-30% but estimated PA pressure suggested severe pulmonary hypertension.     At initial appointment, he reported increased exertional dyspnea over a number of weeks.  RHC was done, showing mildly elevated PCWP with moderate pulmonary HTN and low cardiac index (1.93 thermo, 2.13 Fick).  V/Q scan showed no PE.  I started him on digoxin and have titrated his Lasix to 80 mg daily.  He is now on bisoprolol and able to tolerate it.  At last appointment, I had him try replacing valsartan with Entresto 24/26 bid.  He was unable to tolerate it (made him dizzy, though BP was not low).  Therefore, I had him restart valsartan.  CPX in 4/16 showed mildly decreased functional capacity.  He has tolerated Adcirca 20 mg daily.  He did not tolerate an attempt to uptitrate bisoprolol.  He tried CPAP but was unable to tolerate it.  He was unable to tolerate eplerenone 50 mg daily.  Last echo in 9/17 showed EF 20-25%, stable MV repair, normal RV, and PA systolic pressure 86 mmHg.   He had a Lexiscan Cardiolite in 9/17 that showed infarction, no ischemia.  [place] in 10/17 showed severe mixed pulmonary venous HTN/pulmonary arterial HTN with PVR 4.3  WU and PCWP mildly elevated.  Adcirca was increased to 40 mg daily.   At a prior appointment, he was having more palpitations.  Event monitor in 11/17 showed atrial fibrillation episodes, these were symptomatic.  Since then, the atrial fibrillation seems to have decreased.  He saw [name] to discuss Tikosyn initiation.  No decision was reached at that time, and since palpitations have decreased again, he wants to hold off on Tikosyn for now.   He has OSA.  He cannot tolerate CPAP but is using his oral appliance.    HR was lower in 5/18.  He came into the office with HR around 40.  ECG showed an ectopic atrial rhythm with very small P waves. I stopped bisoprolol and later digoxin.  HR has gone up since.  He wore an event monitor in 6/18 showing rare 3-5 second pauses at night only, rare atrial fibrillation, and rare junctional bradycardia.  He wore an event monitor again in 4/19, this showed rare afib (1% total) with nocturnal bradycardia and 1 nocturnal 3 second pause.  No concerning findings.   He stopped Adcirca as his insurance was no longer covering it.    He saw [name], and it was decided that he would be unlikely to improve much with CRT with his current IVCD (not true LBBB).   Echo (7/19) is comparable to the past with mildly dilated LV, EF 25% with wall motion  abnormalities, mild RV dilation/mild decreased function, mild to moderate AI, stable repaired mitral valve, PASP 76 mmHg.     He returns for followup of CHF. He has been somewhat worse symptomatically for the last 2 months with up and down symptoms.  Generally feeling worse and more fatigued than a year ago.  No orthopnea/PND. No chest pain.  Not really getting short of breath with normal walking, ADLs. Occasional palpitations, he thinks that afib comes and goes but not very often. Weight is stable.   REDS vest reading: 29%  ECG (personally reviewed): NSR, 1st degree AV block, IVCD (LBBB-like) 142 msec, old anterior MI.   6  minute walk (3/16): 381 m  6 minute walk (5/16): 414.5 m 6 minute walk (10/16): 562 m 6 minute walk (1/17): 469 m 6 minute walk (8/17): 488 m 6 minute walk (6/18): 549 m 6 minute walk (11/18): 366 m  Labs (2/15): LDL 144 Labs (8/15): K 4.6, creatinine 0.9 Labs (12/15): HCT 42.3   Labs (2/16): K 4 => 4.2, creatinine 1.05 => 0.92, BNP 268 Labs (3/16): BNP 495 => 320, digoxin 0.4, RF 14.7 (very mild increase), TSH normal, HIV negative, anti-SCL70 negative, creatinine 0.91, K 4.4 Labs (7/16): K 5, creatinine 1.05, BNP 455, vitamin D normal, digoxin 0.4, B12 normal Labs (8/16): digoxin 0.8, BNP 305, K 4.2, creatinine 0.99 Labs (9/16): HCT 43.3, TSH normal, BNP 492 => 278, K 4.3, creatinine 0.97 => 1.04, digoxin 0.6, TSH normal, LDL 154, LDL-P 1654.  Labs (10/16): K 4.7, creatinine 1.1 Labs (1/17): pro-BNP 2065, digoxin 0.3, K 4.3, creatinine 1.10 => 1.28 Labs (3/17): K 4.1, creatinine 1.09, HCT 38.3 Labs (4/17): K 4.4, creatinine 0.99, digoxin 0.8 Labs (5/17): K 4.3, creatinine 1.08, BNP 317, hgb 13.1 Labs (8/17): K 4.6, creatinine 0.99, BNP 392, digoxin 0.5 Labs (9/17): K 4.4, creatinine 1.05 Labs (10/17): digoxin 0.5, K 4.1, creatinine 1.12, HCT 44.9, digoxin 0.5 Labs (11/17): K 4 => 3.8, creatinine 1.3 => 1.16, digoxin 0.5, BNP 296 Labs (12/17): K 4.1, creatinine 1.15, BNP 294, digoxin 0.7 Labs (2/18): LDL 135, Lp(a) 124, LDL-P 979  Labs (5/18): K 4.3, creatinine 1.07 => 0.95, BNP 224, hgb 14.4, digoxin 0.6 Labs (6/18): K 4.4, creatinine 1.08, hgb 14 Labs (8/18): K 4.1, creatinine 1.05 Labs (12/18): K 3.7, creatinine 0.94 Labs (2/19): K 4.1, creatinine 0.99, BNP 255 Labs (5/19): K 4, creatinine 0.98, LDL 140  PMH: 1. CAD: Anterior MI in 2004.  Cardiac surgery in 10/14 included LIMA-LAD.  - Lexiscan Cardiolite (9/17): EF 35%, infarct present with no ischemia.  2. Chronic mitral regurgitation: 10/14 surgery at [facility] with MV repair, Maze, LIMA-LAD, and LA appendage closure.    3. Atrial fibrillation: Paroxysmal.  He was initially on Tikosyn but had breakthrough atrial fibrillation.  H/o Maze in 2014.  Had recurrent atrial fibrillation with ablation in 3/15 by Dr Ola Spurr in [facility].  Not anticoagulated after 2 severe prostate bleeding episodes.  LA appendage was oversewn with MV surgery.  - Zio patch in 12/15 with low atrial fibrillation burden (7%).  - Holter (9/16) with PACs, PVCs, short atrial fibrillation runs (nothing sustained). - Event monitor (11/17) with runs of atrial fibrillation and flutter.   - Event monitor (6/18) with rare atrial fibrillation - Event monitor 4/19 showing rare afib (1% total) with nocturnal bradycardia and 1 nocturnal 3 second pause.  No concerning findings.  4. Chronotropic incompetence.  5. HTN 6. Hyperlipidemia: Refuses statin.  7. Peripheral neuropathy 8. GERD 9. H/o BPPV  10. H/o TIA 11. Ischemic cardiomyopathy: cardiac MRI 7/14 with EF 35%, moderate MR, normal RV size and systolic function, extensive anterior and anteroseptal LGE suggestive of non-viable myocardium (this was prior to LIMA-LAD).  Echo 1/15 with EF 20-25%, moderate AI, PA systolic pressure 39 mmHg. Echo (1/16) with EF 20-25%, diffuse hypokinesis with regionality, moderate LV dilation, moderate AI, s/p MV repair with mild MR and normal gradients, RV dilated with mildly decreased systolic function, PA systolic pressure 71 mmHg.   - RHC (2/16) with mean RA 9, PA 61/25 mean 40, mean PCWP 23, CI 2.13/PVR 4.3 (Fick), CI 1.93/PVR 4.7 (thermo).   - CPX (4/16) with peak VO2 17.9, VE/VCO2 34.7 => mildly decreased functional capacity.  - Spironolactone apparently caused cognitive deficits - Intolerant of Coreg due to development of severe alopecia - Unable to uptitrate bisoprolol due to intolerance.  - Lightheaded with Entresto.  - Unable to tolerate increase in valsartan to 80 mg bid.  - Echo (1/17) with EF 30-35%, moderate LV dilation, mild AI, s/p MV repair with mild  MR, moderately dilated RV with mildly decreased systolic function, PA systolic pressure 69 mmHg.  - CPX (4/17): peak VO2 18, VE/VCO2 slope 33, RER 1.28 => mild to moderate functional impairment, mildly improved.  - Echo (9/17): EF 20-25% with regional WMAs, normal RV size and systolic function, PASP 86 mmHg, stable repaired mitral valve with mild MR, moderate AI.  - RHC (10/17): mean RA 9, PA 70/23 mean 43, PCWP mean 20, CI 2.96 Fick/2.84 Thermo, PVR 4.3 WU.  - Echo (9/18): severe LV dilation, EF 20-25% with WMAs, s/p MV repair with mild MR, moderate AI, moderate TR, PASP 68 mmHg, normal RV size and systolic function.  - Echo (7/19): mildly dilated LV, EF 25% with wall motion abnormalities, mild RV dilation/mild decreased function, mild to moderate AI, stable repaired mitral valve (no MS, mild MR), PASP 76 mmHg.   12. Carotid stenosis: Carotid dopplers (1/16) with 40-59% bilateral ICA stenosis. Carotid dopplers (4/17) with 40-59% BICA stenosis.  - Carotid dopplers (2/18) with < 50% BICA stenosis.  13. Aortic insufficiency: Moderate by last echo 9/17.  14. Pulmonary HTN: Mixed PAH and pulmonary venous hypertension.  PFTs (9/14) were normal.  V/Q scan (2/16) with no evidence of acute or chronic PE.  6 minute walk (3/16) 381 m. 6 minute walk (5/16) 414.5 m. 6 minute walk (1/17) 469 m.  15. OSA: Moderate on 5/16 sleep study. Unable to tolerate CPAP.  Repeat sleep study at [facility] in 2/18 was also suggestive of OSA.  16. Venous insufficiency 17. Ventral hernia 18. Bradycardia: Holter (5/18) with overnight pauses up to 4.7 sec (likely due to OSA), occasional short atrial tachycardia runs, no atrial fibrillation, avg HR 70s, 3% PVCs.  - Event monitor (6/18): Rare 3-4 second pauses at night, rare atrial fibrillation, rare runs of junctional bradycardia.   SH: Married, lives in [place], Engineer, water, nonsmoker  FH: CAD  ROS: All systems reviewed and negative except as per HPI.   Current Outpatient  Medications  Medication Sig Dispense Refill  . aspirin 81 MG chewable tablet Chew 81 mg by mouth daily.    Marland Kitchen eplerenone (INSPRA) 25 MG tablet Take 1 tablet (25 mg total) by mouth daily. 30 tablet 11  . magnesium gluconate (MAGONATE) 500 MG tablet Take 500 mg by mouth 2 (two) times daily. Take 2 tablets (500 mg)  by mouth daily for a total of 1000 mg daily.    . Multiple Vitamins-Minerals (MULTIVITAMIN WITH  MINERALS) tablet Take 1 tablet by mouth daily.    . Omega-3 Fatty Acids (THE VERY FINEST FISH OIL) LIQD Take 2-3 g by mouth daily.     . potassium chloride (K-DUR,KLOR-CON) 10 MEQ tablet TAKE 2 TABLETS BY MOUTH DAILY. 60 tablet 3  . torsemide (DEMADEX) 10 MG tablet TAKE 4 TABLETS BY MOUTH DAILY 120 tablet 1  . torsemide (DEMADEX) 20 MG tablet Take 2 tablets (40 mg total) by mouth daily. 60 tablet 3  . valsartan (DIOVAN) 40 MG tablet TAKE 1 AND 1/2 TABLETS BY MOUTH IN THE MORNING AND 2 TABLETS IN THE EVENING 105 tablet 1  . Zinc 100 MG TABS Take 1 tablet by mouth daily.      No current facility-administered medications for this encounter.    BP (!) 143/54   Pulse 79   Wt 154 lb 12.8 oz (70.2 kg)   SpO2 95%   BMI 22.21 kg/m    Wt Readings from Last 3 Encounters:  10/07/17 154 lb 12.8 oz (70.2 kg)  08/24/17 156 lb 6.4 oz (70.9 kg)  08/17/17 154 lb 8 oz (70.1 kg)    General: NAD Neck: No JVD, no thyromegaly or thyroid nodule.  Lungs: Clear to auscultation bilaterally with normal respiratory effort. CV: Nondisplaced PMI.  Heart regular S1/S2, no S3/S4, 2/6 early SEM RUSB.  No peripheral edema.  No carotid bruit.  Normal pedal pulses.  Abdomen: Soft, nontender, no hepatosplenomegaly, no distention.  Skin: Intact without lesions or rashes.  Neurologic: Alert and oriented x 3.  Psych: Normal affect. Extremities: No clubbing or cyanosis.  HEENT: Normal.   Assessment/Plan: 1. CAD: S/p LIMA-LAD.  He is on ASA 81.  He has decided not to take statins after reviewing the data (I did  recommend taking a statin but we have agreed to disagree on this, he is taking red yeast rice extract).  Lexiscan Cardiolite was done in 9/17 due to atypical chest pain.  This showed prior infarction with no ischemia.  No recurrence of chest pain since that time despite ongoing exercise.   - Continue ASA 81 daily.  2. S/p mitral valve repair: The MV repair looked stable on 7/19 echo with no evidence for significant mitral stenosis and mild MR.  3. Aortic insufficiency: Moderate on 9/18 echo.  Echo in 7/19 with mild-moderate AI.  4. Chronic systolic CHF: Ischemic cardiomyopathy, EF 25% on 7/19 echo, stable.  PA pressure remained elevated by doppler measurement with mildly decreased RV systolic function.  Mildly decreased functional capacity on 4/16 CPX, this was mildly improved on 4/17 CPX.  NYHA class II-III.  He is not volume overloaded on exam or REDS vest reading (29%).  He is overall feeling more fatigued than in the past, at times more dyspnea.    - Continue torsemide 40 mg daily, BMET today.  - Bisoprolol and digoxin were both stopped with bradycardia and lightheadedness.   - He did not tolerate Entresto.  Continue valsartan 60 qam/80 qpm, he has not tolerated uptitration.   - Continue eplerenone 25 (unable to tolerate increase).        - He has an IVCD 142 msec.  Seen by [name], not thought that he would benefit appreciably from CRT as not true LBBB and not markedly wide.  - I will arrange for CPX to get objective measure of functional status given subjective worsening.  - I will refer him back to cardiac rehab.  5. Atrial fibrillation: s/p Maze in 10/14, then ablation in  3/15.  Prior to Maze, he was on Tikosyn but had breakthrough.  Currently, he is not anticoagulated due to history of prostate bleeding and his choice.  He did have his LA appendage oversewn at time of MV surgery.  Rare palpitations now.  1% atrial fibrillation on 4/19 event monitor.  - Off nodal blockers with bradycardia.      - We have had discussions about what to do with his atrial fibrillation.  He is symptomatic when in atrial fibrillation/fluttter.  He had breakthrough on Tikosyn in the past, but has had Maze and atrial fibrillation ablation since that time. He opted to hold off on trying Tikosyn again and currently feels like the atrial fibrillation burden is manageable.  6. Pulmonary hypertension: Severe by most recent echo in 9/17 and on RHC in 10/17.  RV actually looked ok on 9/17 echo.  Mixed pulmonary venous and pulmonary arterial HTN on RHC.  It is possible that the [facility] component is due to pulmonary vascular remodeling in the setting of chronic mitral regurgitation prior to MV repair. Negative V/Q scan, no evidence for CTEPH.  PFTs normal in 9/14.  He is seeing Dr Lake Bells.  Sleep study showed moderate OSA but he has been unable to tolerate CPAP and is now using an oral device. His subjective worsening over the last few months presumably could be due to stopping Adcirca with worsening pulmonary hypertension. PASP 76 mmHg by echo today.  - He decided to stop Adcirca as his insurance was not longer covering it.  He is apparently taking a nitric oxide-active herbal and wants to avoid Adcirca.  - I recommended repeat RHC to reassess PA pressure and filling pressures with addition of Opsumit or Letairis if still with severe PAH. He wants to think about this and do CPX first.  7. OSA: Moderate.  Has not tolerated CPAP. Probably plays a role in recurrent atrial fibrillation and pulmonary hypertension as well as nocturnal sinus pauses.  He has been using his oral device. 8. Carotid stenosis: 2/18 carotid dopplers at [facility] with < 50% bilateral stenosis.   9. Bradycardia: Nocturnal bradycardia likely related to OSA.  Now off digoxin and bisoprolol.  He is in NSR today.   Followup in 2 months.   Loralie Champagne 10/07/2017

## 2017-10-08 ENCOUNTER — Telehealth (HOSPITAL_COMMUNITY): Payer: Self-pay | Admitting: *Deleted

## 2017-10-08 LAB — HIGH SENSITIVITY CRP: CRP, High Sensitivity: 3.73 mg/L — ABNORMAL HIGH (ref 0.00–3.00)

## 2017-10-08 NOTE — Telephone Encounter (Signed)
Called patient and advised him to go to Medical Records to sign a release for Echo.  He can then bring the release to the CV area to pick up CD of his Echo.  Melissa with [facility] is making CD for pt to pick up on Monday 10/11/17.  Pt voiced understanding.

## 2017-10-08 NOTE — Telephone Encounter (Signed)
Patient called triage requesting for a Disc copy of Echo results to send to [place].  He called medical records but transferred him back to our office.  Will forward to Leona Carry to see about getting Echo results to patient.

## 2017-10-08 NOTE — Telephone Encounter (Signed)
Opened in error

## 2017-10-11 ENCOUNTER — Telehealth (HOSPITAL_COMMUNITY): Payer: Self-pay | Admitting: *Deleted

## 2017-10-11 NOTE — Telephone Encounter (Signed)
Result Notes for Lipid Profile   Notes recorded by Darron Doom, RN on 10/11/2017 at 8:14 AM EDT Labs mailed to patient. ------  Notes recorded by Larey Dresser, MD on 10/10/2017 at 11:38 PM EDT Stable creatinine. BNP and CRP are a little higher than before. Please send results of labs to patient. ------  Notes recorded by Scarlette Calico, RN on 10/08/2017 at 3:28 PM EDT Labs reviewed by RN, will forward to MD for review

## 2017-10-12 ENCOUNTER — Telehealth (HOSPITAL_COMMUNITY): Payer: Self-pay

## 2017-10-12 NOTE — Telephone Encounter (Signed)
Patients insurance is active and benefits verified through Medicare A/B - No co-pay, deductible amount of $185.00/$185.00 has been met, no out of pocket, 20% co-insurance, and no pre-authorization is required. Passport/reference [id number]  Patients insurance is active and benefits verified through [employer] - No co-pay, no deductible, no out of pocket, no co-insurance, and no pre-authorization required. Passport/reference [id number]

## 2017-10-15 ENCOUNTER — Telehealth (HOSPITAL_COMMUNITY): Payer: Self-pay

## 2017-10-15 NOTE — Telephone Encounter (Signed)
Attempted to contact patient to see if he is interested in the Cardiac Rehab Program - lm on vm

## 2017-10-18 ENCOUNTER — Telehealth (HOSPITAL_COMMUNITY): Payer: Self-pay

## 2017-10-18 NOTE — Telephone Encounter (Signed)
Patient returned phone in regards to Cardiac Rehab - Scheduled orientation on 11/30/17 at 1:30pm. Patient will attend the 1:15pm exc class. Mailed packet.

## 2017-10-19 ENCOUNTER — Other Ambulatory Visit (HOSPITAL_COMMUNITY): Payer: Self-pay | Admitting: Cardiology

## 2017-10-19 MED FILL — VALSARTAN 40 MG TABLET: 40 | 30 days supply | Qty: 105 | Fill #0

## 2017-10-19 MED FILL — EPLERENONE 25 MG TABLET: 25 | 30 days supply | Qty: 30 | Fill #0

## 2017-10-21 ENCOUNTER — Other Ambulatory Visit (HOSPITAL_COMMUNITY): Payer: Self-pay | Admitting: *Deleted

## 2017-10-21 ENCOUNTER — Ambulatory Visit (HOSPITAL_COMMUNITY)
Admission: RE | Admit: 2017-10-21 | Discharge: 2017-10-21 | Disposition: A | Discharge status: Home / Self Care | Payer: Medicare Other | Source: Ambulatory Visit | Attending: Cardiology | Admitting: Cardiology

## 2017-10-21 ENCOUNTER — Ambulatory Visit (HOSPITAL_COMMUNITY): Payer: Medicare Other

## 2017-10-21 DIAGNOSIS — I272 Pulmonary hypertension, unspecified: Secondary | ICD-10-CM

## 2017-10-21 DIAGNOSIS — I5022 Chronic systolic (congestive) heart failure: Secondary | ICD-10-CM

## 2017-10-22 ENCOUNTER — Other Ambulatory Visit: Payer: Self-pay

## 2017-10-26 ENCOUNTER — Telehealth (HOSPITAL_COMMUNITY): Payer: Self-pay | Admitting: *Deleted

## 2017-10-26 ENCOUNTER — Other Ambulatory Visit (HOSPITAL_COMMUNITY): Payer: Self-pay | Admitting: *Deleted

## 2017-10-26 DIAGNOSIS — I272 Pulmonary hypertension, unspecified: Secondary | ICD-10-CM

## 2017-10-26 NOTE — Telephone Encounter (Signed)
Discussed CPX results w/pt and need for RHC per Dr Aundra Dubin.  After a 15 min conversation pt is agreeable to proceed w/RHC, it is sch for 8/27 at 1:30, all instructions reviewed w/pt via phone, he is agreeable.

## 2017-11-10 MED FILL — TORSEMIDE 10 MG TABLET: 10 | 30 days supply | Qty: 120 | Fill #1

## 2017-11-16 ENCOUNTER — Encounter (HOSPITAL_COMMUNITY)
Admission: RE | Disposition: A | Discharge status: Home / Self Care | Payer: Self-pay | Source: Ambulatory Visit | Attending: Cardiology

## 2017-11-16 ENCOUNTER — Ambulatory Visit (HOSPITAL_COMMUNITY)
Admission: RE | Admit: 2017-11-16 | Discharge: 2017-11-16 | Disposition: A | Discharge status: Home / Self Care | Payer: Medicare Other | Source: Ambulatory Visit | Attending: Cardiology | Admitting: Cardiology

## 2017-11-16 DIAGNOSIS — I509 Heart failure, unspecified: Secondary | ICD-10-CM | POA: Insufficient documentation

## 2017-11-16 DIAGNOSIS — I272 Pulmonary hypertension, unspecified: Secondary | ICD-10-CM | POA: Diagnosis present

## 2017-11-16 DIAGNOSIS — Z8673 Personal history of transient ischemic attack (TIA), and cerebral infarction without residual deficits: Secondary | ICD-10-CM | POA: Diagnosis not present

## 2017-11-16 DIAGNOSIS — I255 Ischemic cardiomyopathy: Secondary | ICD-10-CM | POA: Diagnosis not present

## 2017-11-16 DIAGNOSIS — Z7901 Long term (current) use of anticoagulants: Secondary | ICD-10-CM | POA: Diagnosis not present

## 2017-11-16 DIAGNOSIS — Z951 Presence of aortocoronary bypass graft: Secondary | ICD-10-CM | POA: Diagnosis not present

## 2017-11-16 DIAGNOSIS — I110 Hypertensive heart disease with heart failure: Secondary | ICD-10-CM | POA: Diagnosis not present

## 2017-11-16 DIAGNOSIS — E785 Hyperlipidemia, unspecified: Secondary | ICD-10-CM | POA: Diagnosis not present

## 2017-11-16 DIAGNOSIS — I4891 Unspecified atrial fibrillation: Secondary | ICD-10-CM | POA: Diagnosis not present

## 2017-11-16 DIAGNOSIS — Z7982 Long term (current) use of aspirin: Secondary | ICD-10-CM | POA: Diagnosis not present

## 2017-11-16 DIAGNOSIS — Z882 Allergy status to sulfonamides status: Secondary | ICD-10-CM | POA: Diagnosis not present

## 2017-11-16 DIAGNOSIS — I2721 Secondary pulmonary arterial hypertension: Secondary | ICD-10-CM | POA: Diagnosis not present

## 2017-11-16 DIAGNOSIS — I2510 Atherosclerotic heart disease of native coronary artery without angina pectoris: Secondary | ICD-10-CM | POA: Insufficient documentation

## 2017-11-16 DIAGNOSIS — Z955 Presence of coronary angioplasty implant and graft: Secondary | ICD-10-CM | POA: Diagnosis not present

## 2017-11-16 DIAGNOSIS — K219 Gastro-esophageal reflux disease without esophagitis: Secondary | ICD-10-CM | POA: Diagnosis not present

## 2017-11-16 DIAGNOSIS — G4733 Obstructive sleep apnea (adult) (pediatric): Secondary | ICD-10-CM | POA: Insufficient documentation

## 2017-11-16 HISTORY — PX: RIGHT HEART CATH: CATH118263

## 2017-11-16 LAB — POCT I-STAT 3, VENOUS BLOOD GAS (G3P V)
BICARBONATE: 24.8 mmol/L (ref 20.0–28.0)
O2 Saturation: 69 %
PCO2 VEN: 42.1 mmHg — AB (ref 44.0–60.0)
TCO2: 26 mmol/L (ref 22–32)
pH, Ven: 7.379 (ref 7.250–7.430)
pO2, Ven: 37 mmHg (ref 32.0–45.0)

## 2017-11-16 LAB — CBC
HEMATOCRIT: 42.9 % (ref 39.0–52.0)
HEMOGLOBIN: 13.8 g/dL (ref 13.0–17.0)
MCH: 29.7 pg (ref 26.0–34.0)
MCHC: 32.2 g/dL (ref 30.0–36.0)
MCV: 92.5 fL (ref 78.0–100.0)
Platelets: 174 10*3/uL (ref 150–400)
RBC: 4.64 MIL/uL (ref 4.22–5.81)
RDW: 14.6 % (ref 11.5–15.5)
WBC: 5.5 10*3/uL (ref 4.0–10.5)

## 2017-11-16 LAB — BASIC METABOLIC PANEL
ANION GAP: 10 (ref 5–15)
BUN: 17 mg/dL (ref 8–23)
CHLORIDE: 98 mmol/L (ref 98–111)
CO2: 27 mmol/L (ref 22–32)
Calcium: 9.3 mg/dL (ref 8.9–10.3)
Creatinine, Ser: 0.86 mg/dL (ref 0.61–1.24)
Glucose, Bld: 102 mg/dL — ABNORMAL HIGH (ref 70–99)
POTASSIUM: 4.6 mmol/L (ref 3.5–5.1)
SODIUM: 135 mmol/L (ref 135–145)

## 2017-11-16 LAB — PROTIME-INR
INR: 1.06
Prothrombin Time: 13.7 seconds (ref 11.4–15.2)

## 2017-11-16 SURGERY — RIGHT HEART CATH
Anesthesia: LOCAL

## 2017-11-16 MED ORDER — MIDAZOLAM HCL 5 MG/5ML IJ SOLN
INTRAMUSCULAR | Status: AC
  Filled 2017-11-16: qty 5

## 2017-11-16 MED ORDER — SODIUM CHLORIDE 0.9% FLUSH: 3.0000 mL | Freq: Two times a day (BID) | INTRAVENOUS | Status: DC

## 2017-11-16 MED ORDER — SODIUM CHLORIDE 0.9% FLUSH: 3.0000 mL | INTRAVENOUS | Status: DC | PRN

## 2017-11-16 MED ORDER — LIDOCAINE HCL (PF) 1 % IJ SOLN
INTRAMUSCULAR | Status: DC | PRN
  Administered 2017-11-16: 12 mL

## 2017-11-16 MED ORDER — TORSEMIDE 10 MG PO TABS: 60.0000 mg | ORAL_TABLET | Freq: Every day | ORAL | 1 refills | Status: DC

## 2017-11-16 MED ORDER — SODIUM CHLORIDE 0.9 % IV SOLN: 250.0000 mL | INTRAVENOUS | Status: DC | PRN

## 2017-11-16 MED ORDER — ONDANSETRON HCL 4 MG/2ML IJ SOLN: 4.0000 mg | Freq: Four times a day (QID) | INTRAMUSCULAR | Status: DC | PRN

## 2017-11-16 MED ORDER — FENTANYL CITRATE (PF) 100 MCG/2ML IJ SOLN
INTRAMUSCULAR | Status: DC | PRN
  Administered 2017-11-16: 25 ug via INTRAVENOUS

## 2017-11-16 MED ORDER — SODIUM CHLORIDE 0.9 % IV SOLN
INTRAVENOUS | Status: DC
  Administered 2017-11-16: 12:00:00 via INTRAVENOUS

## 2017-11-16 MED ORDER — MIDAZOLAM HCL 2 MG/2ML IJ SOLN
INTRAMUSCULAR | Status: DC | PRN
  Administered 2017-11-16: 1 mg via INTRAVENOUS

## 2017-11-16 MED ORDER — ACETAMINOPHEN 325 MG PO TABS: 650.0000 mg | ORAL_TABLET | ORAL | Status: DC | PRN

## 2017-11-16 MED ORDER — FENTANYL CITRATE (PF) 100 MCG/2ML IJ SOLN
INTRAMUSCULAR | Status: AC
  Filled 2017-11-16: qty 2

## 2017-11-16 MED ORDER — HEPARIN (PORCINE) IN NACL 1000-0.9 UT/500ML-% IV SOLN
INTRAVENOUS | Status: DC | PRN
  Administered 2017-11-16: 500 mL

## 2017-11-16 SURGICAL SUPPLY — 6 items
CATH SWAN GANZ 7F STRAIGHT (CATHETERS) ×1 IMPLANT
KIT HEART RIGHT NAMIC (KITS) ×1 IMPLANT
PACK CARDIAC CATHETERIZATION (CUSTOM PROCEDURE TRAY) ×2 IMPLANT
SHEATH PINNACLE 6F 10CM (SHEATH) IMPLANT
SHEATH PINNACLE 7F 10CM (SHEATH) ×1 IMPLANT
TRANSDUCER W/STOPCOCK (MISCELLANEOUS) ×2 IMPLANT

## 2017-11-16 NOTE — H&P (Signed)
Advanced Heart Failure Team History and Physical Note   PCP:  Marin Olp, MD  PCP-Cardiology: No primary care provider on file.     Reason for Admission: RHC   HPI:    Patient with worsening HF symptoms, here for RHC.     Review of Systems: All systems reviewed and negative except as per HPI.    Home Medications Prior to Admission medications   Medication Sig Start Date End Date Taking? Authorizing Provider  aspirin 81 MG chewable tablet Chew 81 mg by mouth daily.   Yes [provider]  eplerenone (INSPRA) 25 MG tablet Take 1 tablet (25 mg total) by mouth daily. 03/19/17  Yes Larey Dresser, MD  furosemide (LASIX) 40 MG tablet Take 40 mg by mouth daily.   Yes [provider]  magnesium gluconate (MAGONATE) 500 MG tablet Take 500 mg by mouth 2 (two) times daily.    Yes [provider]  Omega-3 Fatty Acids (FISH OIL PO) Take 360 mg by mouth daily.    Yes [provider]  potassium chloride (K-DUR,KLOR-CON) 10 MEQ tablet TAKE 2 TABLETS BY MOUTH DAILY. Patient taking differently: Take 20 mEq by mouth daily.  02/16/17  Yes Larey Dresser, MD  torsemide (DEMADEX) 10 MG tablet TAKE 4 TABLETS BY MOUTH DAILY Patient taking differently: Take 30 mg by mouth daily.  08/23/17  Yes Larey Dresser, MD  valsartan (DIOVAN) 40 MG tablet TAKE 1 AND 1/2 TABLETS BY MOUTH IN THE MORNING AND 2 TABLETS IN THE EVENING Patient taking differently: Take 60-80 mg by mouth See admin instructions. Take 60 mg by mouth in the morning and take 80 mg by mouth in the evening 10/19/17  Yes Larey Dresser, MD    Past Medical History: Past Medical History:  Diagnosis Date  . Allergic rhinitis   . Atrial fibrillation ([place])   . Atypical pneumonia   . CAD (coronary artery disease)   . Cardiomyopathy, ischemic   . Chronic anticoagulation   . Cough   . Dizziness   . GERD (gastroesophageal reflux disease)   . Heart failure, systolic, acute on chronic ([place])   .  Hyperlipidemia   . Hypertension   . OSA (obstructive sleep apnea)    Home sleep test 07/05/2009 AHI 8.2  . Pleural effusion   . Positional vertigo   . TIA (transient ischemic attack)     Past Surgical History: Past Surgical History:  Procedure Laterality Date  . AORTIC VALVE REPAIR  01/09/13  . CARDIAC CATHETERIZATION N/A 01/10/2016   Procedure: Right Heart Cath;  Surgeon: Larey Dresser, MD;  Location: [facility];  Service: Cardiovascular;  Laterality: N/A;  . CORONARY ARTERY BYPASS GRAFT  01/09/13   LAD [place]A, left atrial appendage  . CORONARY STENT PLACEMENT  2004   LAD  . MITRAL VALVE ANNULOPLASTY  01/09/13  . RIGHT HEART CATHETERIZATION N/A 05/11/2014   Procedure: RIGHT HEART CATH;  Surgeon: Larey Dresser, MD;  Location: [facility] CATH LAB;  Service: Cardiovascular;  Laterality: N/A;  . SHOULDER SURGERY    . TEE WITHOUT CARDIOVERSION N/A 09/21/2012   Procedure: TRANSESOPHAGEAL ECHOCARDIOGRAM (TEE);  Surgeon: Larey Dresser, MD;  Location: [facility] ENDOSCOPY;  Service: Cardiovascular;  Laterality: N/A;    Family History:  Family History  Adopted: Yes  Problem Relation Age of Onset  . Diabetes Unknown        [place]  . Heart disease Neg Hx   . Hypertension Neg Hx  Social History: Social History   Socioeconomic History  . Marital status: Married    Spouse name: Not on file  . Number of children: Not on file  . Years of education: Not on file  . Highest education level: Not on file  Occupational History  . Occupation: PHYSICIAN    Employer: [employer]  Social Needs  . Financial resource strain: Not on file  . Food insecurity:    Worry: Not on file    Inability: Not on file  . Transportation needs:    Medical: Not on file    Non-medical: Not on file  Tobacco Use  . Smoking status: Never Smoker  . Smokeless tobacco: Never Used  Substance and Sexual Activity  . Alcohol use: No  . Drug use: No  . Sexual activity: Not on file    Lifestyle  . Physical activity:    Days per week: Not on file    Minutes per session: Not on file  . Stress: Not on file  Relationships  . Social connections:    Talks on phone: Not on file    Gets together: Not on file    Attends religious service: Not on file    Active member of club or organization: Not on file    Attends meetings of clubs or organizations: Not on file    Relationship status: Not on file  Other Topics Concern  . Not on file  Social History Narrative   Married. Wife used to see Dr. Arnoldo Morale. He saw Dr. Sherren Mocha. 2 sons- 1 is anesthesiologist. 2 grandsons.       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       Hobbies:  Model train, reading, walking in woods, prior fishing, prior golfing    Allergies:  Allergies  Allergen Reactions  . Carvedilol Other (See Comments)    Dizziness, uneasiness, alopecia  . Dabigatran Rash  . Pradaxa [Dabigatran Etexilate Mesylate] Rash  . Sulfonamide Derivatives Rash  . Xarelto [Rivaroxaban] Rash    Objective:    Vital Signs:   Temp:  [97.6 F (36.4 C)] 97.6 F (36.4 C) (08/27 1123) Pulse Rate:  [81] 81 (08/27 1123) BP: (156)/(69) 156/69 (08/27 1123) SpO2:  [98 %] 98 % (08/27 1123) Weight:  [70.3 kg] 70.3 kg (08/27 1123)   Filed Weights   11/16/17 1123  Weight: 70.3 kg     Physical Exam     General:  Well appearing. No respiratory difficulty HEENT: Normal Neck: Supple. no JVD. Carotids 2+ bilat; no bruits. No lymphadenopathy or thyromegaly appreciated. Cor: PMI nondisplaced. Regular rate & rhythm. No rubs, gallops or murmurs. Lungs: Clear Abdomen: Soft, nontender, nondistended. No hepatosplenomegaly. No bruits or masses. Good bowel sounds. Extremities: No cyanosis, clubbing, rash, edema Neuro: Alert & oriented x 3, cranial nerves grossly intact. moves all 4 extremities w/o difficulty. Affect pleasant.   Labs     Basic Metabolic  Panel: No results for input(s): NA, K, CL, CO2, GLUCOSE, BUN, CREATININE, CALCIUM, MG, PHOS in the last 168 hours.  Liver Function Tests: No results for input(s): AST, ALT, ALKPHOS, BILITOT, PROT, ALBUMIN in the last 168 hours. No results for input(s): LIPASE, AMYLASE in the last 168 hours. No results for input(s): AMMONIA in the last 168 hours.  CBC: No results for input(s): WBC, NEUTROABS, HGB, HCT, MCV, PLT in the last 168 hours.  Cardiac Enzymes: No results for input(s): CKTOTAL,  CKMB, CKMBINDEX, TROPONINI in the last 168 hours.  BNP: BNP (last 3 results) Recent Labs    05/18/17 1437 10/07/17 1554  BNP 254.6* 426.7*    ProBNP (last 3 results) No results for input(s): PROBNP in the last 8760 hours.   CBG: No results for input(s): GLUCAP in the last 168 hours.  Coagulation Studies: No results for input(s): LABPROT, INR in the last 72 hours.  Imaging:  No results found.    Assessment/Plan   Patient with history of CHF, worsening symptoms.  Presents for RHC today.    Loralie Champagne, MD 11/16/2017, 12:29 PM  Advanced Heart Failure Team Pager [phone] (M-F; 7a - 4p)  Please contact [facility] for night-coverage after hours (4p -7a ) and weekends on [email or web address]

## 2017-11-16 NOTE — Research (Signed)
PHDEV Informed Consent   Subject Name: [name], PhD  Subject met inclusion and exclusion criteria.  The informed consent form, study requirements and expectations were reviewed with the subject and questions and concerns were addressed prior to the signing of the consent form.  The subject verbalized understanding of the trail requirements.  The subject agreed to participate in the [facility] trial and signed the informed consent.  The informed consent was obtained prior to performance of any protocol-specific procedures for the subject.  A copy of the signed informed consent was given to the subject and a copy was placed in the subject's medical record.  Neva Seat 11/16/2017, 12:00 PM

## 2017-11-16 NOTE — Discharge Instructions (Signed)

## 2017-11-17 ENCOUNTER — Encounter (HOSPITAL_COMMUNITY): Payer: Self-pay | Admitting: Cardiology

## 2017-11-18 ENCOUNTER — Telehealth (HOSPITAL_COMMUNITY): Payer: Self-pay | Admitting: *Deleted

## 2017-11-18 ENCOUNTER — Other Ambulatory Visit: Payer: Self-pay | Admitting: Cardiology

## 2017-11-18 DIAGNOSIS — I272 Pulmonary hypertension, unspecified: Secondary | ICD-10-CM

## 2017-11-18 DIAGNOSIS — I5022 Chronic systolic (congestive) heart failure: Secondary | ICD-10-CM

## 2017-11-18 MED FILL — POTASSIUM CHL ER M10 TABLET: 10 | 30 days supply | Qty: 60 | Fill #0

## 2017-11-18 NOTE — Telephone Encounter (Signed)
-----  Message from Larey Dresser, MD sent at 11/16/2017  3:05 PM EDT ----- 1. Needs followup with me in about 2 wks post-cath.  2. Needs BMET in 1 week.  3. Needs to start Opsumit 10 mg daily, please arrange.   Thanks.  [place]

## 2017-11-18 NOTE — Telephone Encounter (Signed)
Spoke w/pt regarding Dr Claris Gladden recommendations.    1. Pt is sch for 9/24 w/Dr Aundra Dubin, he will keep that appt. 2. bmet sch 9/5, pt did increase his Torsemide and KCL as instructed post cath 3. Pt does not want to start new med until his appt w/Dr Aundra Dubin to discuss further.

## 2017-11-19 ENCOUNTER — Telehealth (HOSPITAL_COMMUNITY): Payer: Self-pay

## 2017-11-19 MED FILL — EPLERENONE 25 MG TABLET: 25 | 30 days supply | Qty: 30 | Fill #1

## 2017-11-19 NOTE — Telephone Encounter (Signed)
OK, can go back to prior dose.

## 2017-11-19 NOTE — Telephone Encounter (Signed)
Pt called to state that [name] increased his torsemide to 6 (10 mg) daily. Pt states that he felt really sick this am 08/30. Within an hour of taking 60 mg, pt was experiencing profound fatigue, not being able to focus, headache, and dizziness.   I advised pt to go back to lower dose of 3 (30 mg) daily until his upcoming visit to clinic on 09/24.

## 2017-11-25 ENCOUNTER — Ambulatory Visit (HOSPITAL_COMMUNITY)
Admission: RE | Admit: 2017-11-25 | Discharge: 2017-11-25 | Disposition: A | Discharge status: Home / Self Care | Payer: Medicare Other | Source: Ambulatory Visit | Attending: Internal Medicine | Admitting: Internal Medicine

## 2017-11-25 DIAGNOSIS — I5022 Chronic systolic (congestive) heart failure: Secondary | ICD-10-CM | POA: Diagnosis not present

## 2017-11-25 DIAGNOSIS — I272 Pulmonary hypertension, unspecified: Secondary | ICD-10-CM | POA: Diagnosis not present

## 2017-11-25 LAB — BASIC METABOLIC PANEL
ANION GAP: 12 (ref 5–15)
BUN: 17 mg/dL (ref 8–23)
CHLORIDE: 96 mmol/L — AB (ref 98–111)
CO2: 28 mmol/L (ref 22–32)
CREATININE: 0.91 mg/dL (ref 0.61–1.24)
Calcium: 9.3 mg/dL (ref 8.9–10.3)
GFR calc non Af Amer: >60 mL/min (ref >60)
Glucose, Bld: 93 mg/dL (ref 70–99)
POTASSIUM: 4.1 mmol/L (ref 3.5–5.1)
SODIUM: 136 mmol/L (ref 135–145)

## 2017-11-29 ENCOUNTER — Telehealth (HOSPITAL_COMMUNITY): Payer: Self-pay | Admitting: Pharmacy Technician

## 2017-11-29 NOTE — Telephone Encounter (Signed)
Cardiac Rehab Medication Review by a Pharmacist  Does the patient  feel that his/her medications are working for him/her?  yes  Has the patient been experiencing any side effects to the medications prescribed?  yes  Does the patient measure his/her own blood pressure or blood glucose at home?  yes   Does the patient have any problems obtaining medications due to transportation or finances?   no  Understanding of regimen: good Understanding of indications: good Potential of compliance: good  Pharmacist comments: The patient reports that he has found that torsemide makes him feel tired during the day, but that his medications seem to be working well overall as he has had less edema. He reports he checks his blood pressure twice a day and readings are usually 120s/80s with pulse rate of 70s. He reports taking some over the counter supplements that he can't remember the names of, but he will bring them to his cardiac rehab appointment.  Brendolyn Patty, PharmD PGY1 Pharmacy Resident Phone [phone]  11/29/2017   6:47 PM

## 2017-11-30 ENCOUNTER — Encounter (HOSPITAL_COMMUNITY)
Admission: RE | Admit: 2017-11-30 | Discharge: 2017-11-30 | Disposition: A | Discharge status: Home / Self Care | Payer: Medicare Other | Source: Ambulatory Visit | Attending: Cardiology | Admitting: Cardiology

## 2017-11-30 ENCOUNTER — Encounter (HOSPITAL_COMMUNITY): Payer: Self-pay

## 2017-11-30 VITALS — BP 124/70 | HR 68 | Ht 69.0 in | Wt 155.2 lb

## 2017-11-30 DIAGNOSIS — I5022 Chronic systolic (congestive) heart failure: Secondary | ICD-10-CM | POA: Diagnosis not present

## 2017-11-30 DIAGNOSIS — I5020 Unspecified systolic (congestive) heart failure: Secondary | ICD-10-CM | POA: Diagnosis present

## 2017-11-30 NOTE — Progress Notes (Signed)
Cardiac Individual Treatment Plan  Patient Details  Name: Caleb HARROWER, PhD MRN: [id number] Date of Birth: April 21, 1940 Referring Provider:   Flowsheet Row CARDIAC REHAB PHASE II ORIENTATION from 11/30/2017 in [place]  Referring Provider  Loralie Champagne, MD.      Initial Encounter Date:  Flowsheet Row CARDIAC REHAB PHASE II ORIENTATION from 11/30/2017 in [place]  Date  11/30/17      Visit Diagnosis: 26/8341 Chronic systolic congestive heart failure ([place])  Patient's Home Medications on Admission:  Current Outpatient Medications:  .  aspirin 81 MG chewable tablet, Chew 81 mg by mouth daily., Disp: , Rfl:  .  eplerenone (INSPRA) 25 MG tablet, Take 1 tablet (25 mg total) by mouth daily., Disp: 30 tablet, Rfl: 11 .  furosemide (LASIX) 40 MG tablet, Take 40 mg by mouth daily., Disp: , Rfl:  .  magnesium gluconate (MAGONATE) 500 MG tablet, Take 500 mg by mouth 2 (two) times daily. , Disp: , Rfl:  .  Omega-3 Fatty Acids (FISH OIL PO), Take 360 mg by mouth daily. , Disp: , Rfl:  .  potassium chloride (K-DUR,KLOR-CON) 10 MEQ tablet, Take 2 tablets (20 mEq total) by mouth daily., Disp: 60 tablet, Rfl: 3 .  torsemide (DEMADEX) 10 MG tablet, Take 6 tablets (60 mg total) by mouth daily. (Patient taking differently: Take 40 mg by mouth daily. ), Disp: 120 tablet, Rfl: 1 .  valsartan (DIOVAN) 40 MG tablet, TAKE 1 AND 1/2 TABLETS BY MOUTH IN THE MORNING AND 2 TABLETS IN THE EVENING (Patient taking differently: Take 40 mg by mouth 2 (two) times daily. ), Disp: 105 tablet, Rfl: 3  Past Medical History: Past Medical History:  Diagnosis Date  . Allergic rhinitis   . Atrial fibrillation ([place])   . Atypical pneumonia   . CAD (coronary artery disease)   . Cardiomyopathy, ischemic   . Chronic anticoagulation   . Cough   . Dizziness   . GERD (gastroesophageal reflux disease)   . Heart failure, systolic, acute on chronic ([place])   .  Hyperlipidemia   . Hypertension   . OSA (obstructive sleep apnea)    Home sleep test 07/05/2009 AHI 8.2  . Pleural effusion   . Positional vertigo   . TIA (transient ischemic attack)     Tobacco Use: Social History   Tobacco Use  Smoking Status Never Smoker  Smokeless Tobacco Never Used    Labs: Recent Review Flowsheet Data    Labs for ITP Cardiac and Pulmonary Rehab Latest Ref Rng & Units 01/10/2016 01/10/2016 08/17/2017 10/07/2017 11/16/2017   Cholestrol 0 - 200 mg/dL - - 212(H) 206(H) -   LDLCALC 0 - 99 mg/dL - - 140(H) 136(H) -   LDLDIRECT mg/dL - - - - -   HDL >40 mg/dL - - 55 53 -   Trlycerides <150 mg/dL - - 86 84 -   Hemoglobin A1c 4.8 - 5.6 % - - 5.5 - -   PHART 7.350 - 7.450 - - - - -   PCO2ART 35.0 - 45.0 mmHg - - - - -   HCO3 20.0 - 28.0 mmol/L 26.7 26.1 - - 24.8   TCO2 22 - 32 mmol/L 28 27 - - 26   ACIDBASEDEF 0.0 - 2.0 mmol/L - - - - -   O2SAT % 68.0 67.0 - - 69.0      Capillary Blood Glucose: No results found for: GLUCAP   Exercise Target Goals:  Exercise Program Goal: Individual exercise prescription set using results from initial 6 min walk test and THRR while considering  patient's activity barriers and safety.   Exercise Prescription Goal: Initial exercise prescription builds to 30-45 minutes a day of aerobic activity, 2-3 days per week.  Home exercise guidelines will be given to patient during program as part of exercise prescription that the participant will acknowledge.  Activity Barriers & Risk Stratification: Activity Barriers & Cardiac Risk Stratification - 11/30/17 1405    Activity Barriers & Cardiac Risk Stratification          Activity Barriers  Shortness of Breath;Other (comment)    Comments  Lightheadedness. OA- hips.    Cardiac Risk Stratification  High           6 Minute Walk: 6 Minute Walk    6 Minute Walk    Row Name 11/30/17 1350   Phase  Initial   Distance  1838 feet   Walk Time  6 minutes   # of Rest Breaks  0   MPH   3.48   METS  4.13   RPE  15   VO2 Peak  14.44   Symptoms  No   Resting HR  68 bpm   Resting BP  124/70   Resting Oxygen Saturation   97 %   Exercise Oxygen Saturation  during 6 min walk  98 %   Max Ex. HR  118 bpm   Max Ex. BP  164/78   2 Minute Post BP  124/72          Oxygen Initial Assessment:   Oxygen Re-Evaluation:   Oxygen Discharge (Final Oxygen Re-Evaluation):   Initial Exercise Prescription: Initial Exercise Prescription - 11/30/17 1500    Date of Initial Exercise RX and Referring Provider          Date  11/30/17    Referring Provider  Loralie Champagne, MD.        Bike          Level  1    Minutes  10    METs  3.7        NuStep          Level  3    SPM  85    Minutes  10    METs  3.4        Track          Laps  15    Minutes  10    METs  3.41        Prescription Details          Frequency (times per week)  3    Duration  Progress to 30 minutes of continuous aerobic without signs/symptoms of physical distress        Intensity          THRR 40-80% of Max Heartrate  56-113    Ratings of Perceived Exertion  11-13    Perceived Dyspnea  0-4        Progression          Progression  Continue to progress workloads to maintain intensity without signs/symptoms of physical distress.        Resistance Training          Training Prescription  Yes    Weight  3lbs    Reps  10-15           Perform Capillary Blood Glucose checks as needed.  Exercise Prescription Changes:  Exercise Comments:   Exercise Goals and Review: Exercise Goals    Exercise Goals    Row Name 11/30/17 1524   Increase Physical Activity  Yes   Intervention  Provide advice, education, support and counseling about physical activity/exercise needs.;Develop an individualized exercise prescription for aerobic and resistive training based on initial evaluation findings, risk stratification, comorbidities and participant's personal goals.   Expected Outcomes  Short  Term: Attend rehab on a regular basis to increase amount of physical activity.;Long Term: Exercising regularly at least 3-5 days a week.;Long Term: Add in home exercise to make exercise part of routine and to increase amount of physical activity.   Increase Strength and Stamina  Yes   Intervention  Provide advice, education, support and counseling about physical activity/exercise needs.;Develop an individualized exercise prescription for aerobic and resistive training based on initial evaluation findings, risk stratification, comorbidities and participant's personal goals.   Expected Outcomes  Short Term: Increase workloads from initial exercise prescription for resistance, speed, and METs.;Short Term: Perform resistance training exercises routinely during rehab and add in resistance training at home;Long Term: Improve cardiorespiratory fitness, muscular endurance and strength as measured by increased METs and functional capacity (6MWT)   Able to understand and use rate of perceived exertion (RPE) scale  Yes   Intervention  Provide education and explanation on how to use RPE scale   Expected Outcomes  Short Term: Able to use RPE daily in rehab to express subjective intensity level;Long Term:  Able to use RPE to guide intensity level when exercising independently   Knowledge and understanding of Target Heart Rate Range (THRR)  Yes   Intervention  Provide education and explanation of THRR including how the numbers were predicted and where they are located for reference   Expected Outcomes  Long Term: Able to use THRR to govern intensity when exercising independently;Short Term: Able to state/look up THRR;Short Term: Able to use daily as guideline for intensity in rehab   Able to check pulse independently  Yes   Intervention  Provide education and demonstration on how to check pulse in carotid and radial arteries.;Review the importance of being able to check your own pulse for safety during independent  exercise   Expected Outcomes  Short Term: Able to explain why pulse checking is important during independent exercise;Long Term: Able to check pulse independently and accurately   Understanding of Exercise Prescription  Yes   Intervention  Provide education, explanation, and written materials on patient's individual exercise prescription   Expected Outcomes  Short Term: Able to explain program exercise prescription;Long Term: Able to explain home exercise prescription to exercise independently          Exercise Goals Re-Evaluation :   Discharge Exercise Prescription (Final Exercise Prescription Changes):   Nutrition:  Target Goals: Understanding of nutrition guidelines, daily intake of sodium <1534m, cholesterol <2066m calories 30% from fat and 7% or less from saturated fats, daily to have 5 or more servings of fruits and vegetables.  Biometrics: Pre Biometrics - 11/30/17 1340    Pre Biometrics          Height  _0  (1.753 m)    Weight  70.4 kg    Waist Circumference  34 inches    Hip Circumference  38 inches    Waist to Hip Ratio  0.89 %    BMI (Calculated)  22.91    Triceps Skinfold  12 mm    % Body Fat  22.4 %    Grip Strength  36 kg    Flexibility  0 in    Single Leg Stand  3.31 seconds            Nutrition Therapy Plan and Nutrition Goals: Nutrition Therapy & Goals - 11/30/17 1438    Nutrition Therapy          Diet  heart healthy, consistent carbohydrate        Personal Nutrition Goals          Nutrition Goal  Pt to identify and limit food sources of saturated fat, trans fat, refined carbohydrates and sodium    Personal Goal #2  Pt able to name foods that affect blood glucose        Intervention Plan          Intervention  Prescribe, educate and counsel regarding individualized specific dietary modifications aiming towards targeted core components such as weight, hypertension, lipid management, diabetes, heart failure and other comorbidities.     Expected Outcomes  Short Term Goal: Understand basic principles of dietary content, such as calories, fat, sodium, cholesterol and nutrients.;Long Term Goal: Adherence to prescribed nutrition plan.           Nutrition Assessments: Nutrition Assessments - 11/30/17 1439    MEDFICTS Scores          Pre Score  15           Nutrition Goals Re-Evaluation:   Nutrition Goals Re-Evaluation:   Nutrition Goals Discharge (Final Nutrition Goals Re-Evaluation):   Psychosocial: Target Goals: Acknowledge presence or absence of significant depression and/or stress, maximize coping skills, provide positive support system. Participant is able to verbalize types and ability to use techniques and skills needed for reducing stress and depression.  Initial Review & Psychosocial Screening: Initial Psych Review & Screening - 11/30/17 1502    Initial Review          Current issues with  None Identified        Family Dynamics          Good Support System?  Yes   spouse, family, friends        Barriers          Psychosocial barriers to participate in program  There are no identifiable barriers or psychosocial needs.        Screening Interventions          Interventions  Encouraged to exercise           Quality of Life Scores: Quality of Life - 11/30/17 1503    Quality of Life          Select  Quality of Life        Quality of Life Scores          Health/Function Pre  14.73 %    Socioeconomic Pre  23.19 %    Psych/Spiritual Pre  19.5 %    Family Pre  24 %    GLOBAL Pre  18.94 %          Scores of 19 and below usually indicate a poorer quality of life in these areas.  A difference of  2-3 points is a clinically meaningful difference.  A difference of 2-3 points in the total score of the Quality of Life Index has been associated with significant improvement in overall quality of life, self-image, physical symptoms, and general health in studies assessing change in quality of  life.  PHQ-9: Recent Review Flowsheet Data    Depression screen [facility] 2/9  02/27/2016 02/07/2014   Decreased Interest 0 0   Down, Depressed, Hopeless 0 0   PHQ - 2 Score 0 0     Interpretation of Total Score  Total Score Depression Severity:  1-4 = Minimal depression, 5-9 = Mild depression, 10-14 = Moderate depression, 15-19 = Moderately severe depression, 20-27 = Severe depression   Psychosocial Evaluation and Intervention:   Psychosocial Re-Evaluation:   Psychosocial Discharge (Final Psychosocial Re-Evaluation):   Vocational Rehabilitation: Provide vocational rehab assistance to qualifying candidates.   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 Participant will state understanding/return demonstration of topics presented.  Learning Barriers/Preferences:   Education Topics: Count Your Pulse:  -Group instruction provided by verbal instruction, demonstration, patient participation and written materials to support subject.  Instructors address importance of being able to find your pulse and how to count your pulse when at home without a heart monitor.  Patients get hands on experience counting their pulse with staff help and individually.   Heart Attack, Angina, and Risk Factor Modification:  -Group instruction provided by verbal instruction, video, and written materials to support subject.  Instructors address signs and symptoms of angina and heart attacks.    Also discuss risk factors for heart disease and how to make changes to improve heart health risk factors.   Functional Fitness:  -Group instruction provided by verbal instruction, demonstration, patient participation, and  written materials to support subject.  Instructors address safety measures for doing things around the house.  Discuss how to get up and down off the floor, how to pick things up properly, how to safely get out of a chair without assistance, and balance training.   Meditation and Mindfulness:  -Group instruction provided by verbal instruction, patient participation, and written materials to support subject.  Instructor addresses importance of mindfulness and meditation practice to help reduce stress and improve awareness.  Instructor also leads participants through a meditation exercise.    Stretching for Flexibility and Mobility:  -Group instruction provided by verbal instruction, patient participation, and written materials to support subject.  Instructors lead participants through series of stretches that are designed to increase flexibility thus improving mobility.  These stretches are additional exercise for major muscle groups that are typically performed during regular warm up and cool down.   Hands Only CPR:  -Group verbal, video, and participation provides a basic overview of AHA guidelines for community CPR. Role-play of emergencies allow participants the opportunity to practice calling for help and chest compression technique with discussion of AED use.   Hypertension: -Group verbal and written instruction that provides a basic overview of hypertension including the most recent diagnostic guidelines, risk factor reduction with self-care instructions and medication management.    Nutrition I class: Heart Healthy Eating:  -Group instruction provided by PowerPoint slides, verbal discussion, and written materials to support subject matter. The instructor gives an explanation and review of the Therapeutic Lifestyle Changes diet recommendations, which includes a discussion on lipid goals, dietary fat, sodium, fiber, plant stanol/sterol esters, sugar, and the components of a well-balanced,  healthy diet.   Nutrition II class: Lifestyle Skills:  -Group instruction provided by PowerPoint slides, verbal discussion, and  written materials to support subject matter. The instructor gives an explanation and review of label reading, grocery shopping for heart health, heart healthy recipe modifications, and ways to make healthier choices when eating out.   Diabetes Question & Answer:  -Group instruction provided by PowerPoint slides, verbal discussion, and written materials to support subject matter. The instructor gives an explanation and review of diabetes co-morbidities, pre- and post-prandial blood glucose goals, pre-exercise blood glucose goals, signs, symptoms, and treatment of hypoglycemia and hyperglycemia, and foot care basics.   Diabetes Blitz:  -Group instruction provided by PowerPoint slides, verbal discussion, and written materials to support subject matter. The instructor gives an explanation and review of the physiology behind type 1 and type 2 diabetes, diabetes medications and rational behind using different medications, pre- and post-prandial blood glucose recommendations and Hemoglobin A1c goals, diabetes diet, and exercise including blood glucose guidelines for exercising safely.    Portion Distortion:  -Group instruction provided by PowerPoint slides, verbal discussion, written materials, and food models to support subject matter. The instructor gives an explanation of serving size versus portion size, changes in portions sizes over the last 20 years, and what consists of a serving from each food group.   Stress Management:  -Group instruction provided by verbal instruction, video, and written materials to support subject matter.  Instructors review role of stress in heart disease and how to cope with stress positively.     Exercising on Your Own:  -Group instruction provided by verbal instruction, power point, and written materials to support subject.  Instructors  discuss benefits of exercise, components of exercise, frequency and intensity of exercise, and end points for exercise.  Also discuss use of nitroglycerin and activating EMS.  Review options of places to exercise outside of rehab.  Review guidelines for sex with heart disease.   Cardiac Drugs I:  -Group instruction provided by verbal instruction and written materials to support subject.  Instructor reviews cardiac drug classes: antiplatelets, anticoagulants, beta blockers, and statins.  Instructor discusses reasons, side effects, and lifestyle considerations for each drug class.   Cardiac Drugs II:  -Group instruction provided by verbal instruction and written materials to support subject.  Instructor reviews cardiac drug classes: angiotensin converting enzyme inhibitors (ACE-I), angiotensin II receptor blockers (ARBs), nitrates, and calcium channel blockers.  Instructor discusses reasons, side effects, and lifestyle considerations for each drug class.   Anatomy and Physiology of the Circulatory System:  Group verbal and written instruction and models provide basic cardiac anatomy and physiology, with the coronary electrical and arterial systems. Review of: AMI, Angina, Valve disease, Heart Failure, Peripheral Artery Disease, Cardiac Arrhythmia, Pacemakers, and the ICD.   Other Education:  -Group or individual verbal, written, or video instructions that support the educational goals of the cardiac rehab program.   Holiday Eating Survival Tips:  -Group instruction provided by PowerPoint slides, verbal discussion, and written materials to support subject matter. The instructor gives patients tips, tricks, and techniques to help them not only survive but enjoy the holidays despite the onslaught of food that accompanies the holidays.   Knowledge Questionnaire Score: Knowledge Questionnaire Score - 11/30/17 1503    Knowledge Questionnaire Score          Pre Score  22/24           Core  Components/Risk Factors/Patient Goals at Admission: Personal Goals and Risk Factors at Admission - 11/30/17 1522    Core Components/Risk Factors/Patient Goals on Admission  Improve shortness of breath with ADL's  Yes    Intervention  Provide education, individualized exercise plan and daily activity instruction to help decrease symptoms of SOB with activities of daily living.    Expected Outcomes  Short Term: Improve cardiorespiratory fitness to achieve a reduction of symptoms when performing ADLs;Long Term: Be able to perform more ADLs without symptoms or delay the onset of symptoms    Intervention  Provide education on lifestyle modifcations including regular physical activity/exercise, weight management, moderate sodium restriction and increased consumption of fresh fruit, vegetables, and low fat dairy, alcohol moderation, and smoking cessation.;Monitor prescription use compliance.    Expected Outcomes  Short Term: Continued assessment and intervention until BP is < 140/62m HG in hypertensive participants. < 130/891mHG in hypertensive participants with diabetes, heart failure or chronic kidney disease.;Long Term: Maintenance of blood pressure at goal levels.    Lipids  Yes    Intervention  Provide education and support for participant on nutrition & aerobic/resistive exercise along with prescribed medications to achieve LDL <7019mHDL >58m69m  Expected Outcomes  Short Term: Participant states understanding of desired cholesterol values and is compliant with medications prescribed. Participant is following exercise prescription and nutrition guidelines.;Long Term: Cholesterol controlled with medications as prescribed, with individualized exercise RX and with personalized nutrition plan. Value goals: LDL < 70mg92mL > 40 mg.    Stress  Yes    Intervention  Offer individual and/or small group education and counseling on adjustment to heart disease, stress management and health-related  lifestyle change. Teach and support self-help strategies.;Refer participants experiencing significant psychosocial distress to appropriate mental health specialists for further evaluation and treatment. When possible, include family members and significant others in education/counseling sessions.    Expected Outcomes  Short Term: Participant demonstrates changes in health-related behavior, relaxation and other stress management skills, ability to obtain effective social support, and compliance with psychotropic medications if prescribed.;Long Term: Emotional wellbeing is indicated by absence of clinically significant psychosocial distress or social isolation.    Personal Goal Other  Yes    Personal Goal  To improve endurance and breathing and improve cardiac function    Intervention  Will monitor progress in porgram    Expected Outcomes  patient will have an improvement in endurance upon completion of cardiac rehab           Core Components/Risk Factors/Patient Goals Review:    Core Components/Risk Factors/Patient Goals at Discharge (Final Review):    ITP Comments: ITP Comments    Row Name 11/29/17 1656   ITP Comments  Dr. TraciFransico Himical Director       Comments: Patient attended orientation from 1341to 1441 to review rules and guidelines for program. Completed 6 minute walk test, Intitial ITP, and exercise prescription.  VSS. Telemetry-sinus rhythm, First degree AV block,  Asymptomatic.  JoannAndi Hence BSN Cardiac Pulmonary Rehab  11/30/17 3:44 PM

## 2017-11-30 NOTE — Progress Notes (Signed)
[name], PhD 77 y.o. male DOB: March 08, 1941 MRN: [id number]      Nutrition Note  1. Chronic systolic congestive heart failure (HCC)    Past Medical History:  Diagnosis Date  . Allergic rhinitis   . Atrial fibrillation ([place])   . Atypical pneumonia   . CAD (coronary artery disease)   . Cardiomyopathy, ischemic   . Chronic anticoagulation   . Cough   . Dizziness   . GERD (gastroesophageal reflux disease)   . Heart failure, systolic, acute on chronic ([place])   . Hyperlipidemia   . Hypertension   . OSA (obstructive sleep apnea)    Home sleep test 07/05/2009 AHI 8.2  . Pleural effusion   . Positional vertigo   . TIA (transient ischemic attack)    Meds reviewed.    Current Outpatient Medications (Cardiovascular):  .  eplerenone (INSPRA) 25 MG tablet, Take 1 tablet (25 mg total) by mouth daily. .  furosemide (LASIX) 40 MG tablet, Take 40 mg by mouth daily. Marland Kitchen  torsemide (DEMADEX) 10 MG tablet, Take 6 tablets (60 mg total) by mouth daily. (Patient taking differently: Take 40 mg by mouth daily. ) .  valsartan (DIOVAN) 40 MG tablet, TAKE 1 AND 1/2 TABLETS BY MOUTH IN THE MORNING AND 2 TABLETS IN THE EVENING (Patient taking differently: Take 40 mg by mouth 2 (two) times daily. )   Current Outpatient Medications (Analgesics):  .  aspirin 81 MG chewable tablet, Chew 81 mg by mouth daily.   Current Outpatient Medications (Other):  .  magnesium gluconate (MAGONATE) 500 MG tablet, Take 500 mg by mouth 2 (two) times daily.  .  Omega-3 Fatty Acids (FISH OIL PO), Take 360 mg by mouth daily.  .  potassium chloride (K-DUR,KLOR-CON) 10 MEQ tablet, Take 2 tablets (20 mEq total) by mouth daily.   HT: Ht Readings from Last 1 Encounters:  11/16/17 _0  (1.778 m)    WT: Wt Readings from Last 5 Encounters:  11/16/17 155 lb (70.3 kg)  10/07/17 154 lb 12.8 oz (70.2 kg)  08/24/17 156 lb 6.4 oz (70.9 kg)  08/17/17 154 lb 8 oz (70.1 kg)  05/18/17 157 lb 4 oz (71.3 kg)     There is no  height or weight on file to calculate BMI.   Current tobacco use? No  Labs:  Lipid Panel     Component Value Date/Time   CHOL 206 (H) 10/07/2017 1557   TRIG 84 10/07/2017 1557   HDL 53 10/07/2017 1557   CHOLHDL 3.9 10/07/2017 1557   VLDL 17 10/07/2017 1557   LDLCALC 136 (H) 10/07/2017 1557   LDLDIRECT 143.9 05/11/2013 1003    Lab Results  Component Value Date   HGBA1C 5.5 08/17/2017   CBG (last 3)  No results for input(s): GLUCAP in the last 72 hours.  Nutrition Note Spoke with pt. Nutrition plan and goals reviewed with pt. Pt is following Step 2 of the Therapeutic Lifestyle Changes diet. Heart healthy eating tips reviewed (label reading, how to build a healthy plate, portion sizes, eating frequently across the day). Pt is unsure if he has pre-diabetes, reports that he knows his last A1c was between 5-6. Pt shared he was told that he has insulin resistance. Pt expressed that he was open to learning how to manage blood sugar with nutrition and lifestyle change. Discussed swapping refined carbohydrates for complex carbohydrates, and educated pt on differences between the two. Patient may need some nutrition education reinforcement. Pt with dx of CHF.  Per discussion, pt does not use canned/convenience foods often. Pt rarely adds salt to food. Pt eats out infrequently. Pt expressed understanding of the information reviewed. Pt aware of nutrition education classes offered and plans on attending nutrition classes.  Nutrition Diagnosis ? Food-and nutrition-related knowledge deficit related to lack of exposure to information as related to diagnosis of: ? CVD    Nutrition Intervention ? Pt's individual nutrition plan and goals reviewed with pt.  Nutrition Goal(s):   ? Pt to identify and limit food sources of saturated fat, trans fat, refined carbohydrates and sodium ? Pt able to name foods that affect blood glucose.   Plan:  ? Pt to attend nutrition classes ? Nutrition I ? Nutrition II  ? Portion Distortion  ? Diabetes Blitz ? Diabetes Q & Ae determined ? Will provide client-centered nutrition education as part of interdisciplinary care ? Monitor and evaluate progress toward nutrition goal with team.   Laurina Bustle, MS, RD, LDN 11/30/2017 2:33 PM

## 2017-12-06 ENCOUNTER — Encounter (HOSPITAL_COMMUNITY): Payer: Medicare Other

## 2017-12-06 ENCOUNTER — Encounter (HOSPITAL_COMMUNITY)
Admission: RE | Admit: 2017-12-06 | Discharge: 2017-12-06 | Disposition: A | Discharge status: Home / Self Care | Payer: Medicare Other | Source: Ambulatory Visit | Attending: Cardiology | Admitting: Cardiology

## 2017-12-06 DIAGNOSIS — I5022 Chronic systolic (congestive) heart failure: Secondary | ICD-10-CM | POA: Diagnosis not present

## 2017-12-06 NOTE — Progress Notes (Signed)
Daily Session Note  Patient Details  Name: Diar W Powell, PhD MRN: [id number] Date of Birth: 08/02/1940 Referring Provider:   Flowsheet Row CARDIAC REHAB PHASE II ORIENTATION from 11/30/2017 in [facility]  Referring Provider  McLean, Dalton, MD.      Encounter Date: 12/06/2017  Check In: Session Check In - 12/06/17 1353    Check-In          Supervising physician immediately available to respond to emergencies  [facility] immediately available    Physician(s)  Dr. Ugah    Location  [facility]    Staff Present  Olinty Richards, MS, ACSM CEP, Exercise Physiologist; , RN, BSN    Medication changes reported      No    Fall or balance concerns reported     No    Tobacco Cessation  No Change    Warm-up and Cool-down  Performed as group-led instruction   orientation    Resistance Training Performed  Yes    VAD Patient?  No    PAD/SET Patient?  No        Pain Assessment          Currently in Pain?  No/denies    Multiple Pain Sites  No           Capillary Blood Glucose: No results found for this or any previous visit (from the past 24 hour(s)).    Social History   Tobacco Use  Smoking Status Never Smoker  Smokeless Tobacco Never Used    Goals Met:  Exercise tolerated well  Goals Unmet:  Not Applicable  Comments: Pt started cardiac rehab today.  Pt tolerated light exercise without difficulty. VSS, telemetry-sinus rhythm, asymptomatic.  Medication list reconciled. Pt denies barriers to medicaiton compliance.  PSYCHOSOCIAL ASSESSMENT:  PHQ-0. . Pt exhibits positive coping skills, hopeful outlook with supportive family. No psychosocial needs identified at this time, no psychosocial interventions necessary.    Pt oriented to exercise equipment and routine.    Understanding verbalized.   , RN, BSN Cardiac Pulmonary Rehab     Dr. Traci Turner is Medical Director for Cardiac Rehab at [facility]. 

## 2017-12-08 ENCOUNTER — Encounter (HOSPITAL_COMMUNITY): Payer: Medicare Other

## 2017-12-08 ENCOUNTER — Encounter (HOSPITAL_COMMUNITY)
Admission: RE | Admit: 2017-12-08 | Discharge: 2017-12-08 | Disposition: A | Discharge status: Home / Self Care | Payer: Medicare Other | Source: Ambulatory Visit | Attending: Cardiology | Admitting: Cardiology

## 2017-12-08 DIAGNOSIS — I5022 Chronic systolic (congestive) heart failure: Secondary | ICD-10-CM | POA: Diagnosis not present

## 2017-12-10 ENCOUNTER — Encounter (HOSPITAL_COMMUNITY): Payer: Medicare Other

## 2017-12-13 ENCOUNTER — Encounter (HOSPITAL_COMMUNITY): Payer: Medicare Other

## 2017-12-13 ENCOUNTER — Encounter (HOSPITAL_COMMUNITY)
Admission: RE | Admit: 2017-12-13 | Discharge: 2017-12-13 | Disposition: A | Discharge status: Home / Self Care | Payer: Medicare Other | Source: Ambulatory Visit | Attending: Cardiology | Admitting: Cardiology

## 2017-12-13 ENCOUNTER — Other Ambulatory Visit (HOSPITAL_COMMUNITY): Payer: Self-pay | Admitting: Cardiology

## 2017-12-13 DIAGNOSIS — I5022 Chronic systolic (congestive) heart failure: Secondary | ICD-10-CM

## 2017-12-14 ENCOUNTER — Ambulatory Visit (HOSPITAL_COMMUNITY)
Admission: RE | Admit: 2017-12-14 | Discharge: 2017-12-14 | Disposition: A | Discharge status: Home / Self Care | Payer: Medicare Other | Source: Ambulatory Visit | Attending: Cardiology | Admitting: Cardiology

## 2017-12-14 ENCOUNTER — Other Ambulatory Visit (HOSPITAL_COMMUNITY): Payer: Medicare Other

## 2017-12-14 ENCOUNTER — Telehealth (HOSPITAL_COMMUNITY): Payer: Self-pay | Admitting: *Deleted

## 2017-12-14 VITALS — BP 130/72 | HR 78 | Wt 156.4 lb

## 2017-12-14 DIAGNOSIS — I252 Old myocardial infarction: Secondary | ICD-10-CM | POA: Diagnosis not present

## 2017-12-14 DIAGNOSIS — I480 Paroxysmal atrial fibrillation: Secondary | ICD-10-CM | POA: Diagnosis not present

## 2017-12-14 DIAGNOSIS — I080 Rheumatic disorders of both mitral and aortic valves: Secondary | ICD-10-CM | POA: Insufficient documentation

## 2017-12-14 DIAGNOSIS — I2721 Secondary pulmonary arterial hypertension: Secondary | ICD-10-CM | POA: Diagnosis not present

## 2017-12-14 DIAGNOSIS — I872 Venous insufficiency (chronic) (peripheral): Secondary | ICD-10-CM | POA: Insufficient documentation

## 2017-12-14 DIAGNOSIS — G629 Polyneuropathy, unspecified: Secondary | ICD-10-CM | POA: Insufficient documentation

## 2017-12-14 DIAGNOSIS — E785 Hyperlipidemia, unspecified: Secondary | ICD-10-CM | POA: Diagnosis not present

## 2017-12-14 DIAGNOSIS — Z8249 Family history of ischemic heart disease and other diseases of the circulatory system: Secondary | ICD-10-CM | POA: Diagnosis not present

## 2017-12-14 DIAGNOSIS — I110 Hypertensive heart disease with heart failure: Secondary | ICD-10-CM | POA: Insufficient documentation

## 2017-12-14 DIAGNOSIS — Z8673 Personal history of transient ischemic attack (TIA), and cerebral infarction without residual deficits: Secondary | ICD-10-CM | POA: Insufficient documentation

## 2017-12-14 DIAGNOSIS — Z9889 Other specified postprocedural states: Secondary | ICD-10-CM

## 2017-12-14 DIAGNOSIS — I272 Pulmonary hypertension, unspecified: Secondary | ICD-10-CM | POA: Diagnosis not present

## 2017-12-14 DIAGNOSIS — I2510 Atherosclerotic heart disease of native coronary artery without angina pectoris: Secondary | ICD-10-CM | POA: Diagnosis not present

## 2017-12-14 DIAGNOSIS — I5022 Chronic systolic (congestive) heart failure: Secondary | ICD-10-CM | POA: Diagnosis not present

## 2017-12-14 DIAGNOSIS — R001 Bradycardia, unspecified: Secondary | ICD-10-CM | POA: Insufficient documentation

## 2017-12-14 DIAGNOSIS — Z951 Presence of aortocoronary bypass graft: Secondary | ICD-10-CM | POA: Insufficient documentation

## 2017-12-14 DIAGNOSIS — G4733 Obstructive sleep apnea (adult) (pediatric): Secondary | ICD-10-CM | POA: Insufficient documentation

## 2017-12-14 DIAGNOSIS — I6523 Occlusion and stenosis of bilateral carotid arteries: Secondary | ICD-10-CM | POA: Diagnosis not present

## 2017-12-14 DIAGNOSIS — Z79899 Other long term (current) drug therapy: Secondary | ICD-10-CM | POA: Insufficient documentation

## 2017-12-14 DIAGNOSIS — Z7982 Long term (current) use of aspirin: Secondary | ICD-10-CM | POA: Diagnosis not present

## 2017-12-14 DIAGNOSIS — I255 Ischemic cardiomyopathy: Secondary | ICD-10-CM | POA: Insufficient documentation

## 2017-12-14 LAB — BASIC METABOLIC PANEL
Anion gap: 17 — ABNORMAL HIGH (ref 5–15)
BUN: 17 mg/dL (ref 8–23)
CHLORIDE: 96 mmol/L — AB (ref 98–111)
CO2: 24 mmol/L (ref 22–32)
Calcium: 9.6 mg/dL (ref 8.9–10.3)
Creatinine, Ser: 1.12 mg/dL (ref 0.61–1.24)
GFR calc non Af Amer: >60 mL/min (ref >60)
Glucose, Bld: 107 mg/dL — ABNORMAL HIGH (ref 70–99)
POTASSIUM: 4.2 mmol/L (ref 3.5–5.1)
SODIUM: 137 mmol/L (ref 135–145)

## 2017-12-14 MED ORDER — ASPIRIN EC 81 MG PO TBEC: 81.0000 mg | DELAYED_RELEASE_TABLET | Freq: Every day | ORAL | 3 refills | Status: DC

## 2017-12-14 NOTE — Patient Instructions (Signed)
Labs today (will call for abnormal results, otherwise no news is good news)  TAKE aspirin 81 mg Once daily  Opsumit has been ordered for you, they will contact you to start medication.  Please call our office once you start taking medication.   Follow up in 3 months.

## 2017-12-14 NOTE — Telephone Encounter (Signed)
[facility] patient enrollment completed/signed and faxed today to [phone].

## 2017-12-15 ENCOUNTER — Encounter (HOSPITAL_COMMUNITY)
Admission: RE | Admit: 2017-12-15 | Discharge: 2017-12-15 | Disposition: A | Discharge status: Home / Self Care | Payer: Medicare Other | Source: Ambulatory Visit | Attending: Cardiology | Admitting: Cardiology

## 2017-12-15 ENCOUNTER — Encounter (HOSPITAL_COMMUNITY): Payer: Medicare Other

## 2017-12-15 DIAGNOSIS — I5022 Chronic systolic (congestive) heart failure: Secondary | ICD-10-CM | POA: Diagnosis not present

## 2017-12-15 LAB — HOMOCYSTEINE: Homocysteine: 15.1 umol/L — ABNORMAL HIGH (ref 0.0–15.0)

## 2017-12-15 NOTE — Progress Notes (Signed)
Patient ID: Caleb Moment, PhD, male   DOB: Mar 03, 1941, 77 y.o.   MRN: [id number]    Advanced Heart Failure Clinic Note   PCP: [name] EP: [name] Cardiology: [name]  77 y.o.with complex past history presents for heart failure followup.  Patient had anterior MI in 2004 and developed ischemic cardiomyopathy as well as mitral regurgitation. He also developed atrial fibrillation.  In 10/14, he had MV repair, Maze, CABG with LIMA-LAD, and LA appendage closure at [facility] in [place].  Subsequently, atrial fibrillation returned and he had an atrial fibrillation ablation in [facility] in 3/15.  He wore a Zio patch in 12/15 and had a low atrial fibrillation burden of 7%.  He has had a long-standing ischemic cardiomyopathy.  In 2015, EF was 20-25%.  Echo in 2016 also showed EF 25-30% but estimated PA pressure suggested severe pulmonary hypertension.     At initial appointment, he reported increased exertional dyspnea over a number of weeks.  RHC was done, showing mildly elevated PCWP with moderate pulmonary HTN and low cardiac index (1.93 thermo, 2.13 Fick).  V/Q scan showed no PE.  I started him on digoxin and have titrated his Lasix to 80 mg daily.  He is now on bisoprolol and able to tolerate it.  At last appointment, I had him try replacing valsartan with Entresto 24/26 bid.  He was unable to tolerate it (made him dizzy, though BP was not low).  Therefore, I had him restart valsartan.  CPX in 4/16 showed mildly decreased functional capacity.  He has tolerated Adcirca 20 mg daily.  He did not tolerate an attempt to uptitrate bisoprolol.  He tried CPAP but was unable to tolerate it.  He was unable to tolerate eplerenone 50 mg daily.  Last echo in 9/17 showed EF 20-25%, stable MV repair, normal RV, and PA systolic pressure 86 mmHg.   He had a Lexiscan Cardiolite in 9/17 that showed infarction, no ischemia.  [place] in 10/17 showed severe mixed pulmonary venous HTN/pulmonary arterial HTN with PVR 4.3  WU and PCWP mildly elevated.  Adcirca was increased to 40 mg daily.   At a prior appointment, he was having more palpitations.  Event monitor in 11/17 showed atrial fibrillation episodes, these were symptomatic.  Since then, the atrial fibrillation seems to have decreased.  He saw [name] to discuss Tikosyn initiation.  No decision was reached at that time, and since palpitations have decreased again, he wants to hold off on Tikosyn for now.   He has OSA.  He cannot tolerate CPAP but is using his oral appliance.    HR was lower in 5/18.  He came into the office with HR around 40.  ECG showed an ectopic atrial rhythm with very small P waves. I stopped bisoprolol and later digoxin.  HR has gone up since.  He wore an event monitor in 6/18 showing rare 3-5 second pauses at night only, rare atrial fibrillation, and rare junctional bradycardia.  He wore an event monitor again in 4/19, this showed rare afib (1% total) with nocturnal bradycardia and 1 nocturnal 3 second pause.  No concerning findings.   He stopped Adcirca as his insurance was no longer covering it.    He saw [name], and it was decided that he would be unlikely to improve much with CRT with his current IVCD (not true LBBB).   Echo (7/19) is comparable to the past with mildly dilated LV, EF 25% with wall motion abnormalities,  mild RV dilation/mild decreased function, mild to moderate AI, stable repaired mitral valve, PASP 76 mmHg.     Given worsening symptoms, he had RHC in 8/19.  This showed evidence for volume overload with severe pulmonary arterial hypertension, PVR 5.8 WU and preserved cardiac output. Torsemide was increased.   He returns for followup of CHF. He has decreased his torsemide back to 40 mg daily.  He says that he is breathing better now that he is taking a nitric oxide supplement.  He has also started cardiac rehab.  Weight is fairly stable.  He did very well on 6 minute walk today.  He does note dyspnea walking up  hills/inclines. No chest pain.    ECG (personally reviewed): Atrial fibrillation with rate 78, IVCD (LBBB-like) 130 msec, old anteroseptal MI.   6 minute walk (3/16): 381 m  6 minute walk (5/16): 414.5 m 6 minute walk (10/16): 562 m 6 minute walk (1/17): 469 m 6 minute walk (8/17): 488 m 6 minute walk (6/18): 549 m 6 minute walk (11/18): 366 m 6 minute walk (9/19): 518 m  Labs (2/15): LDL 144 Labs (8/15): K 4.6, creatinine 0.9 Labs (12/15): HCT 42.3   Labs (2/16): K 4 => 4.2, creatinine 1.05 => 0.92, BNP 268 Labs (3/16): BNP 495 => 320, digoxin 0.4, RF 14.7 (very mild increase), TSH normal, HIV negative, anti-SCL70 negative, creatinine 0.91, K 4.4 Labs (7/16): K 5, creatinine 1.05, BNP 455, vitamin D normal, digoxin 0.4, B12 normal Labs (8/16): digoxin 0.8, BNP 305, K 4.2, creatinine 0.99 Labs (9/16): HCT 43.3, TSH normal, BNP 492 => 278, K 4.3, creatinine 0.97 => 1.04, digoxin 0.6, TSH normal, LDL 154, LDL-P 1654.  Labs (10/16): K 4.7, creatinine 1.1 Labs (1/17): pro-BNP 2065, digoxin 0.3, K 4.3, creatinine 1.10 => 1.28 Labs (3/17): K 4.1, creatinine 1.09, HCT 38.3 Labs (4/17): K 4.4, creatinine 0.99, digoxin 0.8 Labs (5/17): K 4.3, creatinine 1.08, BNP 317, hgb 13.1 Labs (8/17): K 4.6, creatinine 0.99, BNP 392, digoxin 0.5 Labs (9/17): K 4.4, creatinine 1.05 Labs (10/17): digoxin 0.5, K 4.1, creatinine 1.12, HCT 44.9, digoxin 0.5 Labs (11/17): K 4 => 3.8, creatinine 1.3 => 1.16, digoxin 0.5, BNP 296 Labs (12/17): K 4.1, creatinine 1.15, BNP 294, digoxin 0.7 Labs (2/18): LDL 135, Lp(a) 124, LDL-P 979  Labs (5/18): K 4.3, creatinine 1.07 => 0.95, BNP 224, hgb 14.4, digoxin 0.6 Labs (6/18): K 4.4, creatinine 1.08, hgb 14 Labs (8/18): K 4.1, creatinine 1.05 Labs (12/18): K 3.7, creatinine 0.94 Labs (2/19): K 4.1, creatinine 0.99, BNP 255 Labs (5/19): K 4, creatinine 0.98, LDL 140 Labs (7/19): LDL 136 Labs (9/19): K 4.1, creatinine 0.91  PMH: 1. CAD: Anterior MI in 2004.   Cardiac surgery in 10/14 included LIMA-LAD.  - Lexiscan Cardiolite (9/17): EF 35%, infarct present with no ischemia.  2. Chronic mitral regurgitation: 10/14 surgery at [facility] with MV repair, Maze, LIMA-LAD, and LA appendage closure.  3. Atrial fibrillation: Paroxysmal.  He was initially on Tikosyn but had breakthrough atrial fibrillation.  H/o Maze in 2014.  Had recurrent atrial fibrillation with ablation in 3/15 by Dr Ola Spurr in [facility].  Not anticoagulated after 2 severe prostate bleeding episodes.  LA appendage was oversewn with MV surgery.  - Zio patch in 12/15 with low atrial fibrillation burden (7%).  - Holter (9/16) with PACs, PVCs, short atrial fibrillation runs (nothing sustained). - Event monitor (11/17) with runs of atrial fibrillation and flutter.   - Event monitor (6/18) with rare atrial fibrillation -  Event monitor 4/19 showing rare afib (1% total) with nocturnal bradycardia and 1 nocturnal 3 second pause.  No concerning findings.  4. Chronotropic incompetence.  5. HTN 6. Hyperlipidemia: Refuses statin.  7. Peripheral neuropathy 8. GERD 9. H/o BPPV 10. H/o TIA 11. Ischemic cardiomyopathy: cardiac MRI 7/14 with EF 35%, moderate MR, normal RV size and systolic function, extensive anterior and anteroseptal LGE suggestive of non-viable myocardium (this was prior to LIMA-LAD).  Echo 1/15 with EF 20-25%, moderate AI, PA systolic pressure 39 mmHg. Echo (1/16) with EF 20-25%, diffuse hypokinesis with regionality, moderate LV dilation, moderate AI, s/p MV repair with mild MR and normal gradients, RV dilated with mildly decreased systolic function, PA systolic pressure 71 mmHg.   - RHC (2/16) with mean RA 9, PA 61/25 mean 40, mean PCWP 23, CI 2.13/PVR 4.3 (Fick), CI 1.93/PVR 4.7 (thermo).   - CPX (4/16) with peak VO2 17.9, VE/VCO2 34.7 => mildly decreased functional capacity.  - Spironolactone apparently caused cognitive deficits - Intolerant of Coreg due to development of severe  alopecia - Unable to uptitrate bisoprolol due to intolerance.  - Lightheaded with Entresto.  - Unable to tolerate increase in valsartan to 80 mg bid.  - Echo (1/17) with EF 30-35%, moderate LV dilation, mild AI, s/p MV repair with mild MR, moderately dilated RV with mildly decreased systolic function, PA systolic pressure 69 mmHg.  - CPX (4/17): peak VO2 18, VE/VCO2 slope 33, RER 1.28 => mild to moderate functional impairment, mildly improved.  - Echo (9/17): EF 20-25% with regional WMAs, normal RV size and systolic function, PASP 86 mmHg, stable repaired mitral valve with mild MR, moderate AI.  - RHC (10/17): mean RA 9, PA 70/23 mean 43, PCWP mean 20, CI 2.96 Fick/2.84 Thermo, PVR 4.3 WU.  - Echo (9/18): severe LV dilation, EF 20-25% with WMAs, s/p MV repair with mild MR, moderate AI, moderate TR, PASP 68 mmHg, normal RV size and systolic function.  - Echo (7/19): mildly dilated LV, EF 25% with wall motion abnormalities, mild RV dilation/mild decreased function, mild to moderate AI, stable repaired mitral valve (no MS, mild MR), PASP 76 mmHg.   - CPX (8/19): peak VO2 18.9, VE/VCO2 slope 40, RER 1.09 => moderate HF limitation.  - RHC (8/19): mean RA 14, PA 75/28 mean 47, mean PCWP 24, CI 2.44/PVR 5 Fick, CI 2.13/PVR 5.8 thermo.  12. Carotid stenosis: Carotid dopplers (1/16) with 40-59% bilateral ICA stenosis. Carotid dopplers (4/17) with 40-59% BICA stenosis.  - Carotid dopplers (2/18) with < 50% BICA stenosis.  13. Aortic insufficiency: Moderate by last echo 9/17.  14. Pulmonary HTN: Mixed PAH and pulmonary venous hypertension.  PFTs (9/14) were normal.  V/Q scan (2/16) with no evidence of acute or chronic PE.   15. OSA: Moderate on 5/16 sleep study. Unable to tolerate CPAP.  Repeat sleep study at [facility] in 2/18 was also suggestive of OSA.  16. Venous insufficiency 17. Ventral hernia 18. Bradycardia: Holter (5/18) with overnight pauses up to 4.7 sec (likely due to OSA), occasional short  atrial tachycardia runs, no atrial fibrillation, avg HR 70s, 3% PVCs.  - Event monitor (6/18): Rare 3-4 second pauses at night, rare atrial fibrillation, rare runs of junctional bradycardia.   SH: Married, lives in [place], Engineer, water, nonsmoker  FH: CAD  ROS: All systems reviewed and negative except as per HPI.   Current Outpatient Medications  Medication Sig Dispense Refill  . eplerenone (INSPRA) 25 MG tablet Take 1 tablet (25 mg total) by  mouth daily. 30 tablet 11  . magnesium gluconate (MAGONATE) 500 MG tablet Take 500 mg by mouth 2 (two) times daily.     . Omega-3 Fatty Acids (FISH OIL PO) Take 360 mg by mouth daily.     . potassium chloride (K-DUR,KLOR-CON) 10 MEQ tablet Take 2 tablets (20 mEq total) by mouth daily. 60 tablet 3  . torsemide (DEMADEX) 10 MG tablet Take 6 tablets (60 mg total) by mouth daily. (Patient taking differently: Take 40 mg by mouth daily. ) 120 tablet 1  . valsartan (DIOVAN) 40 MG tablet TAKE 1 AND 1/2 TABLETS BY MOUTH IN THE MORNING AND 2 TABLETS IN THE EVENING (Patient taking differently: Take 40 mg by mouth 2 (two) times daily. ) 105 tablet 3  . aspirin EC 81 MG tablet Take 1 tablet (81 mg total) by mouth daily. 30 tablet 3   No current facility-administered medications for this encounter.    BP 130/72   Pulse 78   Wt 70.9 kg (156 lb 6.4 oz)   SpO2 98%   BMI 23.10 kg/m    Wt Readings from Last 3 Encounters:  12/14/17 70.9 kg (156 lb 6.4 oz)  11/30/17 70.4 kg (155 lb 3.3 oz)  11/16/17 70.3 kg (155 lb)    General: NAD Neck: No JVD, no thyromegaly or thyroid nodule.  Lungs: Clear to auscultation bilaterally with normal respiratory effort. CV: Nondisplaced PMI.  Heart regular S1/S2, no S3/S4, 2/6 SEM RUSB.  1+ left ankle edema.  No carotid bruit.  Normal pedal pulses.  Abdomen: Soft, nontender, no hepatosplenomegaly, no distention.  Skin: Intact without lesions or rashes.  Neurologic: Alert and oriented x 3.  Psych: Normal  affect. Extremities: No clubbing or cyanosis.  HEENT: Normal.    Assessment/Plan: 1. CAD: S/p LIMA-LAD.  He has decided not to take statins after reviewing the data (I did recommend taking a statin but we have agreed to disagree on this, he is taking red yeast rice extract).  Lexiscan Cardiolite was done in 9/17 due to atypical chest pain.  This showed prior infarction with no ischemia.  No recurrence of chest pain since that time despite ongoing exercise.   - He has been off aspirin but agrees to restart 81 mg daily.  2. S/p mitral valve repair: The MV repair looked stable on 7/19 echo with no evidence for significant mitral stenosis and mild MR.  3. Aortic insufficiency: Moderate on 9/18 echo.  Echo in 7/19 with mild-moderate AI.  4. Chronic systolic CHF: Ischemic cardiomyopathy, EF 25% on 7/19 echo, stable.  PA pressure remained elevated by doppler measurement with mildly decreased RV systolic function.  Most recent CPX in 8/19 with moderate HF limitation, mildly worse than prior.  He was volume overloaded on 8/19 RHC with severe pulmonary hypertension but preserved cardiac output.  He increased torsemide for a while but is now back to torsemide 40 mg daily.  He is actually doing better symptomatically, NYHA class II symptoms. He did very good on 6 minute walk today.  Volume looks ok on exam today.  - Continue torsemide 40 mg daily, BMET today.  - Bisoprolol and digoxin were both stopped with bradycardia and lightheadedness.   - He did not tolerate Entresto.  Continue valsartan 40 mg bid, he has not tolerated uptitration.   - Continue eplerenone 25 (unable to tolerate increase).        - He has an IVCD 130 msec.  Seen by [name], not thought that he would  benefit appreciably from CRT as not true LBBB and not markedly wide.  - He has started cardiac rehab.   5. Atrial fibrillation: s/p Maze in 10/14, then ablation in 3/15.  Prior to Maze, he was on Tikosyn but had breakthrough.  Currently, he is  not anticoagulated due to history of prostate bleeding and his choice.  He did have his LA appendage oversewn at time of MV surgery.   1% atrial fibrillation on 4/19 event monitor. He actually is in atrial fibrillation, rate-controlled, today.  He does not feel it.  - Off nodal blockers with bradycardia.    - We have had discussions about what to do with his atrial fibrillation.  He is symptomatic at times when in atrial fibrillation/fluttter (but not today).  He had breakthrough on Tikosyn in the past, but has had Maze and atrial fibrillation ablation since that time. He opted to hold off on trying Tikosyn again and currently feels like the atrial fibrillation burden is manageable.  6. Pulmonary hypertension: Severe by echo in 9/17 and on RHC in 10/17.  RV actually looked ok on 9/17 echo.  Mixed pulmonary venous and pulmonary arterial HTN on RHC.  It is possible that the [facility] component is due to pulmonary vascular remodeling in the setting of chronic mitral regurgitation prior to MV repair. Negative V/Q scan, no evidence for CTEPH.  PFTs normal in 9/14.  He is seeing Dr Lake Bells.  Sleep study showed moderate OSA but he has been unable to tolerate CPAP and is now using an oral device. PASP 76 mmHg by echo in 7/19.  He tried Engineer, site but stopped it so that he could take a nitric oxide supplement.  I repeated RHC in 8/19.  This showed severe mixed pulmonary venous/pulmonary arterial HTN with PVR 5.8 WU and preserved cardiac output.  His 6 minute walk today was actually quite good.  - He wants to continue to take nitric oxide-active herbal and wants to avoid Adcirca => interestingly, his symptoms have improved considerably in the last few weeks on this supplement. - He agrees to try macitentan 10 mg daily.  I will work on getting this for him.  - Repeat 6 minute walk at next appt.  7. OSA: Moderate.  Has not tolerated CPAP. Probably plays a role in recurrent atrial fibrillation and pulmonary hypertension as well as  nocturnal sinus pauses.  He has been using his oral device. 8. Carotid stenosis: 2/18 carotid dopplers at [facility] with < 50% bilateral stenosis.   9. Bradycardia: Nocturnal bradycardia likely related to OSA.  Now off digoxin and bisoprolol.  He is in atrial fibrillation today, rate in 70s.  Given bradycardia when in NSR, will not restart nodal blockers.   Followup in 2 months.   Loralie Champagne 12/15/2017

## 2017-12-15 NOTE — Progress Notes (Signed)
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 male Nutrition Note Spoke with pt. Nutrition plan and goals reviewed with pt. Pt is following Step 2 of the Therapeutic Lifestyle Changes diet. Heart healthy eating tips reviewed (label reading, how to build a healthy plate, portion sizes, eating frequently across the day). At orientation pt reported last A1c was between 5-6, told that he has insulin resistance. Pt expressed that he was open to learning how to manage blood sugar with nutrition and lifestyle change. Discussed swapping refined carbohydrates for complex carbohydrates, and educated pt on differences between the two. Distributed handout to help pt decipher between carbohydrates.  Pt with dx of CHF. Per discussion, pt does not use canned/convenience foods often. Pt rarely adds salt to food. Pt eats out infrequently. Pt expressed understanding of the information reviewed. Pt aware of nutrition education classes offered and plans on attending nutrition classes.  Lab Results  Component Value Date   HGBA1C 5.5 08/17/2017    Wt Readings from Last 3 Encounters:  12/14/17 156 lb 6.4 oz (70.9 kg)  11/30/17 155 lb 3.3 oz (70.4 kg)  11/16/17 155 lb (70.3 kg)    Nutrition Diagnosis ? Food-and nutrition-related knowledge deficit related to lack of exposure to information as related to diagnosis of: ? CVD    Nutrition Intervention ? Pt's individual nutrition plan reviewed with pt. ? Benefits of adopting Heart Healthy diet discussed when [employer] reviewed.    Goal(s)  ? Pt to identify and limit food sources of saturated fat, trans fat, refined carbohydrates and sodium ? Pt able to name foods that affect blood glucose.  Plan:   Pt to attend nutrition classes ? Nutrition I ? Nutrition II ? Portion Distortion   Will provide client-centered nutrition education as part of interdisciplinary care  Monitor and evaluate progress toward nutrition goal with team.    Laurina Bustle, MS, RD, LDN 12/15/2017 2:16 PM

## 2017-12-17 ENCOUNTER — Telehealth (HOSPITAL_COMMUNITY): Payer: Self-pay | Admitting: Cardiology

## 2017-12-17 ENCOUNTER — Encounter (HOSPITAL_COMMUNITY): Payer: Medicare Other

## 2017-12-17 MED ORDER — TORSEMIDE 10 MG PO TABS: ORAL_TABLET | ORAL | 1 refills | Status: DC

## 2017-12-17 MED FILL — TORSEMIDE 10 MG TABLET: 10 | 30 days supply | Qty: 150 | Fill #0

## 2017-12-17 NOTE — Telephone Encounter (Signed)
Updated script sent to pharmacy

## 2017-12-20 ENCOUNTER — Encounter (HOSPITAL_COMMUNITY): Payer: Medicare Other

## 2017-12-20 ENCOUNTER — Encounter (HOSPITAL_COMMUNITY)
Admission: RE | Admit: 2017-12-20 | Discharge: 2017-12-20 | Disposition: A | Discharge status: Home / Self Care | Payer: Medicare Other | Source: Ambulatory Visit | Attending: Cardiology | Admitting: Cardiology

## 2017-12-20 ENCOUNTER — Telehealth (HOSPITAL_COMMUNITY): Payer: Self-pay | Admitting: Pharmacist

## 2017-12-20 DIAGNOSIS — I5022 Chronic systolic (congestive) heart failure: Secondary | ICD-10-CM | POA: Diagnosis not present

## 2017-12-20 NOTE — Telephone Encounter (Signed)
[employer] PA approved by [facility] through 03/22/18.   [name], PharmD, BCPS, CPP Clinical Pharmacist Phone: [phone] 12/20/2017 9:08 AM

## 2017-12-22 ENCOUNTER — Encounter (HOSPITAL_COMMUNITY): Payer: Medicare Other

## 2017-12-22 ENCOUNTER — Encounter (HOSPITAL_COMMUNITY)
Admission: RE | Admit: 2017-12-22 | Discharge: 2017-12-22 | Disposition: A | Discharge status: Home / Self Care | Payer: Medicare Other | Source: Ambulatory Visit | Attending: Cardiology | Admitting: Cardiology

## 2017-12-22 DIAGNOSIS — I5020 Unspecified systolic (congestive) heart failure: Secondary | ICD-10-CM | POA: Diagnosis present

## 2017-12-22 DIAGNOSIS — I5022 Chronic systolic (congestive) heart failure: Secondary | ICD-10-CM | POA: Insufficient documentation

## 2017-12-23 ENCOUNTER — Telehealth (HOSPITAL_COMMUNITY): Payer: Self-pay | Admitting: Pharmacist

## 2017-12-23 NOTE — Progress Notes (Deleted)
Cardiac Individual Treatment Plan  Patient Details  Name: Caleb RAIMONDI, PhD MRN: [id number] Date of Birth: [birth year] Referring Provider:   Flowsheet Row CARDIAC REHAB PHASE II ORIENTATION from 11/30/2017 in [facility]  Referring Provider  Loralie Champagne, MD.      Initial Encounter Date:  Flowsheet Row CARDIAC REHAB PHASE II ORIENTATION from 11/30/2017 in [facility]  Date  11/30/17      Visit Diagnosis: 77/4128 Chronic systolic congestive heart failure ([place])  Patient's Home Medications on Admission:  Current Outpatient Medications:  .  aspirin EC 81 MG tablet, Take 1 tablet (81 mg total) by mouth daily., Disp: 30 tablet, Rfl: 3 .  eplerenone (INSPRA) 25 MG tablet, Take 1 tablet (25 mg total) by mouth daily., Disp: 30 tablet, Rfl: 11 .  macitentan (OPSUMIT) 10 MG tablet, Take 10 mg by mouth daily., Disp: , Rfl:  .  magnesium gluconate (MAGONATE) 500 MG tablet, Take 500 mg by mouth 2 (two) times daily. , Disp: , Rfl:  .  Omega-3 Fatty Acids (FISH OIL PO), Take 360 mg by mouth daily. , Disp: , Rfl:  .  potassium chloride (K-DUR,KLOR-CON) 10 MEQ tablet, Take 2 tablets (20 mEq total) by mouth daily., Disp: 60 tablet, Rfl: 3 .  torsemide (DEMADEX) 10 MG tablet, Take 4 tablets (40 mg total) by mouth daily. May also take 1 tablet (10 mg total) as needed (sob/edema)., Disp: 150 tablet, Rfl: 1 .  valsartan (DIOVAN) 40 MG tablet, TAKE 1 AND 1/2 TABLETS BY MOUTH IN THE MORNING AND 2 TABLETS IN THE EVENING (Patient taking differently: Take 40 mg by mouth 2 (two) times daily. ), Disp: 105 tablet, Rfl: 3  Past Medical History: Past Medical History:  Diagnosis Date  . Allergic rhinitis   . Atrial fibrillation ([place])   . Atypical pneumonia   . CAD (coronary artery disease)   . Cardiomyopathy, ischemic   . Chronic anticoagulation   . Cough   . Dizziness   . GERD (gastroesophageal reflux disease)   . Heart failure, systolic, acute  on chronic ([place])   . Hyperlipidemia   . Hypertension   . OSA (obstructive sleep apnea)    Home sleep test 07/05/2009 AHI 8.2  . Pleural effusion   . Positional vertigo   . TIA (transient ischemic attack)     Tobacco Use: Social History   Tobacco Use  Smoking Status Never Smoker  Smokeless Tobacco Never Used    Labs: Recent Review Flowsheet Data    Labs for ITP Cardiac and Pulmonary Rehab Latest Ref Rng & Units 01/10/2016 01/10/2016 08/17/2017 10/07/2017 11/16/2017   Cholestrol 0 - 200 mg/dL - - 212(H) 206(H) -   LDLCALC 0 - 99 mg/dL - - 140(H) 136(H) -   LDLDIRECT mg/dL - - - - -   HDL >40 mg/dL - - 55 53 -   Trlycerides <150 mg/dL - - 86 84 -   Hemoglobin A1c 4.8 - 5.6 % - - 5.5 - -   PHART 7.350 - 7.450 - - - - -   PCO2ART 35.0 - 45.0 mmHg - - - - -   HCO3 20.0 - 28.0 mmol/L 26.7 26.1 - - 24.8   TCO2 22 - 32 mmol/L 28 27 - - 26   ACIDBASEDEF 0.0 - 2.0 mmol/L - - - - -   O2SAT % 68.0 67.0 - - 69.0      Capillary Blood Glucose: No results found for: GLUCAP  Exercise Target Goals: Exercise Program Goal: Individual exercise prescription set using results from initial 6 min walk test and THRR while considering  patient's activity barriers and safety.   Exercise Prescription Goal: Initial exercise prescription builds to 30-45 minutes a day of aerobic activity, 2-3 days per week.  Home exercise guidelines will be given to patient during program as part of exercise prescription that the participant will acknowledge.  Activity Barriers & Risk Stratification: Activity Barriers & Cardiac Risk Stratification - 11/30/17 1405    Activity Barriers & Cardiac Risk Stratification          Activity Barriers  Shortness of Breath;Other (comment)    Comments  Lightheadedness. OA- hips.    Cardiac Risk Stratification  High           6 Minute Walk: 6 Minute Walk    6 Minute Walk    Row Name 11/30/17 1350   Phase  Initial   Distance  1838 feet   Walk Time  6 minutes   # of  Rest Breaks  0   MPH  3.48   METS  4.13   RPE  15   VO2 Peak  14.44   Symptoms  No   Resting HR  68 bpm   Resting BP  124/70   Resting Oxygen Saturation   97 %   Exercise Oxygen Saturation  during 6 min walk  98 %   Max Ex. HR  118 bpm   Max Ex. BP  164/78   2 Minute Post BP  124/72          Oxygen Initial Assessment:   Oxygen Re-Evaluation:   Oxygen Discharge (Final Oxygen Re-Evaluation):   Initial Exercise Prescription: Initial Exercise Prescription - 11/30/17 1500    Date of Initial Exercise RX and Referring Provider          Date  11/30/17    Referring Provider  Loralie Champagne, MD.        Bike          Level  1    Minutes  10    METs  3.7        NuStep          Level  3    SPM  85    Minutes  10    METs  3.4        Track          Laps  15    Minutes  10    METs  3.41        Prescription Details          Frequency (times per week)  3    Duration  Progress to 30 minutes of continuous aerobic without signs/symptoms of physical distress        Intensity          THRR 40-80% of Max Heartrate  56-113    Ratings of Perceived Exertion  11-13    Perceived Dyspnea  0-4        Progression          Progression  Continue to progress workloads to maintain intensity without signs/symptoms of physical distress.        Resistance Training          Training Prescription  Yes    Weight  3lbs    Reps  10-15           Perform Capillary Blood Glucose checks as needed.  Exercise Prescription  Changes: Exercise Prescription Changes    Response to Exercise    Row Name 12/06/17 1332 12/20/17 1328   Blood Pressure (Admit)  120/60  138/70   Blood Pressure (Exercise)  164/74  142/62   Blood Pressure (Exit)  126/70  130/64   Heart Rate (Admit)  81 bpm  85 bpm   Heart Rate (Exercise)  104 bpm  111 bpm   Heart Rate (Exit)  81 bpm  84 bpm   Rating of Perceived Exertion (Exercise)  15  13   Symptoms  none  none   Comments  Decreased bike workloads,  too difficult.  no documentation   Duration  Progress to 30 minutes of  aerobic without signs/symptoms of physical distress  Progress to 30 minutes of  aerobic without signs/symptoms of physical distress   Intensity  THRR unchanged  THRR unchanged       Progression    Row Name 12/06/17 1332 12/20/17 1328   Progression  Continue to progress workloads to maintain intensity without signs/symptoms of physical distress.  Continue to progress workloads to maintain intensity without signs/symptoms of physical distress.   Average METs  2.9  3.1       Resistance Training    Row Name 12/06/17 1332 12/20/17 1328   Training Prescription  No  Yes   Weight  no documentation  4lbs   Reps  no documentation  10-15   Time  no documentation  10 Minutes       Interval Training    Row Name 12/06/17 1332 12/20/17 1328   Interval Training  No  No       Bike    Row Name 12/06/17 1332 12/20/17 1328   Level  0.5 Decrease workload, too hard.  0.5   Minutes  10  10   METs  2.34  2.33       NuStep    Row Name 12/06/17 1332 12/20/17 1328   Level  3  4   SPM  85  85   Minutes  10  10   METs  2.6  3.3       Track    Row Name 12/06/17 1332 12/20/17 1328   Laps  16  16   Minutes  10  10   METs  3.79  3.79          Exercise Comments: Exercise Comments    Row Name 12/06/17 1420 12/20/17 1353   Exercise Comments  Patient tolerated exercise fairly well, decreased workload on aridyne due to dificulty.  Reviewed METs and goals with patient.      Exercise Goals and Review: Exercise Goals    Exercise Goals    Row Name 11/30/17 1524   Increase Physical Activity  Yes   Intervention  Provide advice, education, support and counseling about physical activity/exercise needs.;Develop an individualized exercise prescription for aerobic and resistive training based on initial evaluation findings, risk stratification, comorbidities and participant's personal goals.   Expected Outcomes  Short Term: Attend  rehab on a regular basis to increase amount of physical activity.;Long Term: Exercising regularly at least 3-5 days a week.;Long Term: Add in home exercise to make exercise part of routine and to increase amount of physical activity.   Increase Strength and Stamina  Yes   Intervention  Provide advice, education, support and counseling about physical activity/exercise needs.;Develop an individualized exercise prescription for aerobic and resistive training based on initial evaluation findings, risk stratification, comorbidities and participant's personal goals.  Expected Outcomes  Short Term: Increase workloads from initial exercise prescription for resistance, speed, and METs.;Short Term: Perform resistance training exercises routinely during rehab and add in resistance training at home;Long Term: Improve cardiorespiratory fitness, muscular endurance and strength as measured by increased METs and functional capacity (6MWT)   Able to understand and use rate of perceived exertion (RPE) scale  Yes   Intervention  Provide education and explanation on how to use RPE scale   Expected Outcomes  Short Term: Able to use RPE daily in rehab to express subjective intensity level;Long Term:  Able to use RPE to guide intensity level when exercising independently   Knowledge and understanding of Target Heart Rate Range (THRR)  Yes   Intervention  Provide education and explanation of THRR including how the numbers were predicted and where they are located for reference   Expected Outcomes  Long Term: Able to use THRR to govern intensity when exercising independently;Short Term: Able to state/look up THRR;Short Term: Able to use daily as guideline for intensity in rehab   Able to check pulse independently  Yes   Intervention  Provide education and demonstration on how to check pulse in carotid and radial arteries.;Review the importance of being able to check your own pulse for safety during independent exercise    Expected Outcomes  Short Term: Able to explain why pulse checking is important during independent exercise;Long Term: Able to check pulse independently and accurately   Understanding of Exercise Prescription  Yes   Intervention  Provide education, explanation, and written materials on patient's individual exercise prescription   Expected Outcomes  Short Term: Able to explain program exercise prescription;Long Term: Able to explain home exercise prescription to exercise independently          Exercise Goals Re-Evaluation : Exercise Goals Re-Evaluation    Exercise Goal Re-Evaluation    Row Name 12/06/17 1420 12/20/17 1353   Exercise Goals Review  Able to understand and use rate of perceived exertion (RPE) scale  Able to understand and use rate of perceived exertion (RPE) scale;Increase Physical Activity   Comments  Patient able to understand and use RPE scale appropriately.  Patient is walking 30 minutes, 2-3 days/week in addition to exercise at cardiac rehab.   Expected Outcomes  Increase workloads as tolerated to help improve endurance and cardiac function.  Patient will continue current exercise routine: 30 minutes, 5-6 days/week to help achieve personal health and fitness goals.          Discharge Exercise Prescription (Final Exercise Prescription Changes): Exercise Prescription Changes - 12/20/17 1328    Response to Exercise          Blood Pressure (Admit)  138/70    Blood Pressure (Exercise)  142/62    Blood Pressure (Exit)  130/64    Heart Rate (Admit)  85 bpm    Heart Rate (Exercise)  111 bpm    Heart Rate (Exit)  84 bpm    Rating of Perceived Exertion (Exercise)  13    Symptoms  none    Duration  Progress to 30 minutes of  aerobic without signs/symptoms of physical distress    Intensity  THRR unchanged        Progression          Progression  Continue to progress workloads to maintain intensity without signs/symptoms of physical distress.    Average METs  3.1         Resistance Training  Training Prescription  Yes    Weight  4lbs    Reps  10-15    Time  10 Minutes        Interval Training          Interval Training  No        Bike          Level  0.5    Minutes  10    METs  2.33        NuStep          Level  4    SPM  85    Minutes  10    METs  3.3        Track          Laps  16    Minutes  10    METs  3.79           Nutrition:  Target Goals: Understanding of nutrition guidelines, daily intake of sodium <1551m, cholesterol <2022m calories 30% from fat and 7% or less from saturated fats, daily to have 5 or more servings of fruits and vegetables.  Biometrics: Pre Biometrics - 11/30/17 1340    Pre Biometrics          Height  _0  (1.753 m)    Weight  70.4 kg    Waist Circumference  34 inches    Hip Circumference  38 inches    Waist to Hip Ratio  0.89 %    BMI (Calculated)  22.91    Triceps Skinfold  12 mm    % Body Fat  22.4 %    Grip Strength  36 kg    Flexibility  0 in    Single Leg Stand  3.31 seconds            Nutrition Therapy Plan and Nutrition Goals: Nutrition Therapy & Goals - 11/30/17 1438    Nutrition Therapy          Diet  heart healthy, consistent carbohydrate        Personal Nutrition Goals          Nutrition Goal  Pt to identify and limit food sources of saturated fat, trans fat, refined carbohydrates and sodium    Personal Goal #2  Pt able to name foods that affect blood glucose        Intervention Plan          Intervention  Prescribe, educate and counsel regarding individualized specific dietary modifications aiming towards targeted core components such as weight, hypertension, lipid management, diabetes, heart failure and other comorbidities.    Expected Outcomes  Short Term Goal: Understand basic principles of dietary content, such as calories, fat, sodium, cholesterol and nutrients.;Long Term Goal: Adherence to prescribed nutrition plan.           Nutrition  Assessments: Nutrition Assessments - 11/30/17 1439    MEDFICTS Scores          Pre Score  15           Nutrition Goals Re-Evaluation:   Nutrition Goals Re-Evaluation:   Nutrition Goals Discharge (Final Nutrition Goals Re-Evaluation):   Psychosocial: Target Goals: Acknowledge presence or absence of significant depression and/or stress, maximize coping skills, provide positive support system. Participant is able to verbalize types and ability to use techniques and skills needed for reducing stress and depression.  Initial Review & Psychosocial Screening: Initial Psych Review & Screening - 11/30/17 1502    Initial Review  Current issues with  None Identified        Family Dynamics          Good Support System?  Yes   spouse, family, friends        Barriers          Psychosocial barriers to participate in program  There are no identifiable barriers or psychosocial needs.        Screening Interventions          Interventions  Encouraged to exercise           Quality of Life Scores: Quality of Life - 11/30/17 1503    Quality of Life          Select  Quality of Life        Quality of Life Scores          Health/Function Pre  14.73 %    Socioeconomic Pre  23.19 %    Psych/Spiritual Pre  19.5 %    Family Pre  24 %    GLOBAL Pre  18.94 %          Scores of 19 and below usually indicate a poorer quality of life in these areas.  A difference of  2-3 points is a clinically meaningful difference.  A difference of 2-3 points in the total score of the Quality of Life Index has been associated with significant improvement in overall quality of life, self-image, physical symptoms, and general health in studies assessing change in quality of life.  PHQ-9: Recent Review Flowsheet Data    Depression screen [facility] 2/9 02/27/2016 02/07/2014   Decreased Interest 0 0   Down, Depressed, Hopeless 0 0   PHQ - 2 Score 0 0     Interpretation of Total Score  Total  Score Depression Severity:  1-4 = Minimal depression, 5-9 = Mild depression, 10-14 = Moderate depression, 15-19 = Moderately severe depression, 20-27 = Severe depression   Psychosocial Evaluation and Intervention: Psychosocial Evaluation - 12/06/17 1445    Psychosocial Evaluation & Interventions          Interventions  Encouraged to exercise with the program and follow exercise prescription    Comments  no psychosocial needs identified, no interventions necessary . pt enjoys reading and researching things.     Expected Outcomes  pt will exhibit positive outlook with good coping skills.     Continue Psychosocial Services   No Follow up required           Psychosocial Re-Evaluation: Psychosocial Re-Evaluation    Psychosocial Re-Evaluation    Row Name 12/23/17 [phone]   Current issues with  None Identified   Comments  no psychosocial needs identified, no interventions necessary    Expected Outcomes  pt will exhibit positive outlook with good coping skills.    Interventions  Encouraged to attend Cardiac Rehabilitation for the exercise   Continue Psychosocial Services   No Follow up required          Psychosocial Discharge (Final Psychosocial Re-Evaluation): Psychosocial Re-Evaluation - 12/23/17 0734    Psychosocial Re-Evaluation          Current issues with  None Identified    Comments  no psychosocial needs identified, no interventions necessary     Expected Outcomes  pt will exhibit positive outlook with good coping skills.     Interventions  Encouraged to attend Cardiac Rehabilitation for the exercise    Continue Psychosocial Services   No Follow up required  Vocational Rehabilitation: Provide vocational rehab assistance to qualifying candidates.   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 Participant will state understanding/return demonstration of topics presented.  Learning Barriers/Preferences:   Education Topics: Count Your Pulse:  -Group instruction provided by verbal instruction, demonstration, patient participation and written materials to support subject.  Instructors address importance of being able to find your pulse and how to count your pulse when at home without a heart monitor.  Patients get hands on experience counting their pulse with staff help and individually.   Heart Attack, Angina, and Risk Factor Modification:  -Group instruction provided by verbal instruction, video, and written materials to support subject.  Instructors address signs and symptoms of angina and heart attacks.    Also discuss risk factors for heart disease and how to make changes to improve heart health risk factors.   Functional Fitness:  -Group instruction provided by verbal instruction, demonstration, patient participation, and written materials to support subject.  Instructors address safety measures for doing things around the house.  Discuss how to get up and down off the floor, how to pick things up properly, how to safely get out of a chair without assistance, and balance training.   Meditation and Mindfulness:  -Group instruction provided by verbal instruction, patient participation, and written materials to support subject.  Instructor addresses importance of mindfulness and meditation practice to help reduce stress and improve awareness.  Instructor also leads participants through a meditation exercise.    Stretching for Flexibility and Mobility:  -Group instruction provided by verbal instruction, patient participation, and written materials to support subject.  Instructors lead participants through series of stretches that are designed to  increase flexibility thus improving mobility.  These stretches are additional exercise for major muscle groups that are typically performed during regular warm up and cool down.   Hands Only CPR:  -Group verbal, video, and participation provides a basic overview of AHA guidelines for community CPR. Role-play of emergencies allow participants the opportunity to practice calling for help and chest compression technique with discussion of AED use.   Hypertension: -Group verbal and written instruction that provides a basic overview of hypertension including the most recent diagnostic guidelines, risk factor reduction with self-care instructions and medication management.    Nutrition I class: Heart Healthy Eating:  -Group instruction provided by PowerPoint slides, verbal discussion, and written materials to support subject matter. The instructor gives an explanation and review of the Therapeutic Lifestyle Changes diet recommendations, which includes a discussion on lipid goals, dietary fat, sodium, fiber, plant stanol/sterol esters, sugar, and the components of a well-balanced, healthy diet.   Nutrition II class: Lifestyle Skills:  -Group instruction provided by PowerPoint slides, verbal discussion, and written materials to support subject matter. The instructor gives an explanation and review of label reading, grocery shopping for heart health, heart healthy recipe modifications, and ways to make healthier choices when eating out.   Diabetes Question & Answer:  -Group instruction provided by PowerPoint slides, verbal discussion, and written materials to support subject matter. The instructor gives an explanation and review of diabetes co-morbidities, pre- and post-prandial blood glucose goals, pre-exercise blood glucose goals, signs,  symptoms, and treatment of hypoglycemia and hyperglycemia, and foot care basics.   Diabetes Blitz:  -Group instruction provided by PowerPoint slides, verbal  discussion, and written materials to support subject matter. The instructor gives an explanation and review of the physiology behind type 1 and type 2 diabetes, diabetes medications and rational behind using different medications, pre- and post-prandial blood glucose recommendations and Hemoglobin A1c goals, diabetes diet, and exercise including blood glucose guidelines for exercising safely.    Portion Distortion:  -Group instruction provided by PowerPoint slides, verbal discussion, written materials, and food models to support subject matter. The instructor gives an explanation of serving size versus portion size, changes in portions sizes over the last 20 years, and what consists of a serving from each food group.   Stress Management:  -Group instruction provided by verbal instruction, video, and written materials to support subject matter.  Instructors review role of stress in heart disease and how to cope with stress positively.     Exercising on Your Own:  -Group instruction provided by verbal instruction, power point, and written materials to support subject.  Instructors discuss benefits of exercise, components of exercise, frequency and intensity of exercise, and end points for exercise.  Also discuss use of nitroglycerin and activating EMS.  Review options of places to exercise outside of rehab.  Review guidelines for sex with heart disease.   Cardiac Drugs I:  -Group instruction provided by verbal instruction and written materials to support subject.  Instructor reviews cardiac drug classes: antiplatelets, anticoagulants, beta blockers, and statins.  Instructor discusses reasons, side effects, and lifestyle considerations for each drug class.   Cardiac Drugs II:  -Group instruction provided by verbal instruction and written materials to support subject.  Instructor reviews cardiac drug classes: angiotensin converting enzyme inhibitors (ACE-I), angiotensin II receptor blockers (ARBs),  nitrates, and calcium channel blockers.  Instructor discusses reasons, side effects, and lifestyle considerations for each drug class.   Anatomy and Physiology of the Circulatory System:  Group verbal and written instruction and models provide basic cardiac anatomy and physiology, with the coronary electrical and arterial systems. Review of: AMI, Angina, Valve disease, Heart Failure, Peripheral Artery Disease, Cardiac Arrhythmia, Pacemakers, and the ICD.   Other Education:  -Group or individual verbal, written, or video instructions that support the educational goals of the cardiac rehab program.   Holiday Eating Survival Tips:  -Group instruction provided by PowerPoint slides, verbal discussion, and written materials to support subject matter. The instructor gives patients tips, tricks, and techniques to help them not only survive but enjoy the holidays despite the onslaught of food that accompanies the holidays.   Knowledge Questionnaire Score: Knowledge Questionnaire Score - 11/30/17 1503    Knowledge Questionnaire Score          Pre Score  22/24           Core Components/Risk Factors/Patient Goals at Admission: Personal Goals and Risk Factors at Admission - 11/30/17 1522    Core Components/Risk Factors/Patient Goals on Admission          Improve shortness of breath with ADL's  Yes    Intervention  Provide education, individualized exercise plan and daily activity instruction to help decrease symptoms of SOB with activities of daily living.    Expected Outcomes  Short Term: Improve cardiorespiratory fitness to achieve a reduction of symptoms when performing ADLs;Long Term: Be able to perform more ADLs without symptoms or delay the onset of symptoms    Intervention  Provide education on  lifestyle modifcations including regular physical activity/exercise, weight management, moderate sodium restriction and increased consumption of fresh fruit, vegetables, and low fat dairy, alcohol  moderation, and smoking cessation.;Monitor prescription use compliance.    Expected Outcomes  Short Term: Continued assessment and intervention until BP is < 140/57m HG in hypertensive participants. < 130/844mHG in hypertensive participants with diabetes, heart failure or chronic kidney disease.;Long Term: Maintenance of blood pressure at goal levels.    Lipids  Yes    Intervention  Provide education and support for participant on nutrition & aerobic/resistive exercise along with prescribed medications to achieve LDL <7075mHDL >34m59m  Expected Outcomes  Short Term: Participant states understanding of desired cholesterol values and is compliant with medications prescribed. Participant is following exercise prescription and nutrition guidelines.;Long Term: Cholesterol controlled with medications as prescribed, with individualized exercise RX and with personalized nutrition plan. Value goals: LDL < 70mg47mL > 40 mg.    Stress  Yes    Intervention  Offer individual and/or small group education and counseling on adjustment to heart disease, stress management and health-related lifestyle change. Teach and support self-help strategies.;Refer participants experiencing significant psychosocial distress to appropriate mental health specialists for further evaluation and treatment. When possible, include family members and significant others in education/counseling sessions.    Expected Outcomes  Short Term: Participant demonstrates changes in health-related behavior, relaxation and other stress management skills, ability to obtain effective social support, and compliance with psychotropic medications if prescribed.;Long Term: Emotional wellbeing is indicated by absence of clinically significant psychosocial distress or social isolation.    Personal Goal Other  Yes    Personal Goal  To improve endurance and breathing and improve cardiac function    Intervention  Will monitor progress in porgram    Expected  Outcomes  patient will have an improvement in endurance upon completion of cardiac rehab           Core Components/Risk Factors/Patient Goals Review:  Goals and Risk Factor Review    Core Components/Risk Factors/Patient Goals Review    Row Name 12/06/17 1523 12/23/17 0735   Personal Goals Review  Improve shortness of breath with ADL's;Hypertension;Lipids;Stress;Other  Improve shortness of breath with ADL's;Hypertension;Lipids;Stress;Other   Review  pt with multiple CAD RF demonstrates eagerness to participate in CR program.pt personal goal to improve cardiac risk stratification, improve circulation and overall heart health.   pt with multiple CAD RF demonstrates eagerness to participate in CR program.pt personal goal to improve cardiac risk stratification, improve circulation and overall heart health. pt exhibits improvement with functional ability by increased workloads at CR and navigating stairs at home with less difficulty.      Expected Outcomes  pt will participate in CR exercise, nutrition and lifestyle modification to decrease overall RF.   pt will participate in CR exercise, nutrition and lifestyle modification to decrease overall RF.           Core Components/Risk Factors/Patient Goals at Discharge (Final Review):  Goals and Risk Factor Review - 12/23/17 0735    Core Components/Risk Factors/Patient Goals Review          Personal Goals Review  Improve shortness of breath with ADL's;Hypertension;Lipids;Stress;Other    Review  pt with multiple CAD RF demonstrates eagerness to participate in CR program.pt personal goal to improve cardiac risk stratification, improve circulation and overall heart health. pt exhibits improvement with functional ability by increased workloads at CR and navigating stairs at home with less difficulty.  Expected Outcomes  pt will participate in CR exercise, nutrition and lifestyle modification to decrease overall RF.            ITP Comments: ITP  Comments    Row Name 11/29/17 1656 12/06/17 1442 12/23/17 0733   ITP Comments  Dr. Fransico Him, Medical Director   pt started group exercise. pt tolerated light activity without difficulty. pt oriented to exercise equipment and safety routine.   30 day ITP review. pt with good attendance and participation.       Comments:

## 2017-12-23 NOTE — Progress Notes (Signed)
Cardiac Individual Treatment Plan  Patient Details  Name: Caleb RAIMONDI, PhD MRN: [id number] Date of Birth: [birth year] Referring Provider:   Flowsheet Row CARDIAC REHAB PHASE II ORIENTATION from 11/30/2017 in [facility]  Referring Provider  Loralie Champagne, MD.      Initial Encounter Date:  Flowsheet Row CARDIAC REHAB PHASE II ORIENTATION from 11/30/2017 in [facility]  Date  11/30/17      Visit Diagnosis: 77/4128 Chronic systolic congestive heart failure ([place])  Patient's Home Medications on Admission:  Current Outpatient Medications:  .  aspirin EC 81 MG tablet, Take 1 tablet (81 mg total) by mouth daily., Disp: 30 tablet, Rfl: 3 .  eplerenone (INSPRA) 25 MG tablet, Take 1 tablet (25 mg total) by mouth daily., Disp: 30 tablet, Rfl: 11 .  macitentan (OPSUMIT) 10 MG tablet, Take 10 mg by mouth daily., Disp: , Rfl:  .  magnesium gluconate (MAGONATE) 500 MG tablet, Take 500 mg by mouth 2 (two) times daily. , Disp: , Rfl:  .  Omega-3 Fatty Acids (FISH OIL PO), Take 360 mg by mouth daily. , Disp: , Rfl:  .  potassium chloride (K-DUR,KLOR-CON) 10 MEQ tablet, Take 2 tablets (20 mEq total) by mouth daily., Disp: 60 tablet, Rfl: 3 .  torsemide (DEMADEX) 10 MG tablet, Take 4 tablets (40 mg total) by mouth daily. May also take 1 tablet (10 mg total) as needed (sob/edema)., Disp: 150 tablet, Rfl: 1 .  valsartan (DIOVAN) 40 MG tablet, TAKE 1 AND 1/2 TABLETS BY MOUTH IN THE MORNING AND 2 TABLETS IN THE EVENING (Patient taking differently: Take 40 mg by mouth 2 (two) times daily. ), Disp: 105 tablet, Rfl: 3  Past Medical History: Past Medical History:  Diagnosis Date  . Allergic rhinitis   . Atrial fibrillation ([place])   . Atypical pneumonia   . CAD (coronary artery disease)   . Cardiomyopathy, ischemic   . Chronic anticoagulation   . Cough   . Dizziness   . GERD (gastroesophageal reflux disease)   . Heart failure, systolic, acute  on chronic ([place])   . Hyperlipidemia   . Hypertension   . OSA (obstructive sleep apnea)    Home sleep test 07/05/2009 AHI 8.2  . Pleural effusion   . Positional vertigo   . TIA (transient ischemic attack)     Tobacco Use: Social History   Tobacco Use  Smoking Status Never Smoker  Smokeless Tobacco Never Used    Labs: Recent Review Flowsheet Data    Labs for ITP Cardiac and Pulmonary Rehab Latest Ref Rng & Units 01/10/2016 01/10/2016 08/17/2017 10/07/2017 11/16/2017   Cholestrol 0 - 200 mg/dL - - 212(H) 206(H) -   LDLCALC 0 - 99 mg/dL - - 140(H) 136(H) -   LDLDIRECT mg/dL - - - - -   HDL >40 mg/dL - - 55 53 -   Trlycerides <150 mg/dL - - 86 84 -   Hemoglobin A1c 4.8 - 5.6 % - - 5.5 - -   PHART 7.350 - 7.450 - - - - -   PCO2ART 35.0 - 45.0 mmHg - - - - -   HCO3 20.0 - 28.0 mmol/L 26.7 26.1 - - 24.8   TCO2 22 - 32 mmol/L 28 27 - - 26   ACIDBASEDEF 0.0 - 2.0 mmol/L - - - - -   O2SAT % 68.0 67.0 - - 69.0      Capillary Blood Glucose: No results found for: GLUCAP  Exercise Target Goals: Exercise Program Goal: Individual exercise prescription set using results from initial 6 min walk test and THRR while considering  patient's activity barriers and safety.   Exercise Prescription Goal: Initial exercise prescription builds to 30-45 minutes a day of aerobic activity, 2-3 days per week.  Home exercise guidelines will be given to patient during program as part of exercise prescription that the participant will acknowledge.  Activity Barriers & Risk Stratification: Activity Barriers & Cardiac Risk Stratification - 11/30/17 1405    Activity Barriers & Cardiac Risk Stratification          Activity Barriers  Shortness of Breath;Other (comment)    Comments  Lightheadedness. OA- hips.    Cardiac Risk Stratification  High           6 Minute Walk: 6 Minute Walk    6 Minute Walk    Row Name 11/30/17 1350   Phase  Initial   Distance  1838 feet   Walk Time  6 minutes   # of  Rest Breaks  0   MPH  3.48   METS  4.13   RPE  15   VO2 Peak  14.44   Symptoms  No   Resting HR  68 bpm   Resting BP  124/70   Resting Oxygen Saturation   97 %   Exercise Oxygen Saturation  during 6 min walk  98 %   Max Ex. HR  118 bpm   Max Ex. BP  164/78   2 Minute Post BP  124/72          Oxygen Initial Assessment:   Oxygen Re-Evaluation:   Oxygen Discharge (Final Oxygen Re-Evaluation):   Initial Exercise Prescription: Initial Exercise Prescription - 11/30/17 1500    Date of Initial Exercise RX and Referring Provider          Date  11/30/17    Referring Provider  Loralie Champagne, MD.        Bike          Level  1    Minutes  10    METs  3.7        NuStep          Level  3    SPM  85    Minutes  10    METs  3.4        Track          Laps  15    Minutes  10    METs  3.41        Prescription Details          Frequency (times per week)  3    Duration  Progress to 30 minutes of continuous aerobic without signs/symptoms of physical distress        Intensity          THRR 40-80% of Max Heartrate  56-113    Ratings of Perceived Exertion  11-13    Perceived Dyspnea  0-4        Progression          Progression  Continue to progress workloads to maintain intensity without signs/symptoms of physical distress.        Resistance Training          Training Prescription  Yes    Weight  3lbs    Reps  10-15           Perform Capillary Blood Glucose checks as needed.  Exercise Prescription  Changes: Exercise Prescription Changes    Response to Exercise    Row Name 12/06/17 1332 12/20/17 1328   Blood Pressure (Admit)  120/60  138/70   Blood Pressure (Exercise)  164/74  142/62   Blood Pressure (Exit)  126/70  130/64   Heart Rate (Admit)  81 bpm  85 bpm   Heart Rate (Exercise)  104 bpm  111 bpm   Heart Rate (Exit)  81 bpm  84 bpm   Rating of Perceived Exertion (Exercise)  15  13   Symptoms  none  none   Comments  Decreased bike workloads,  too difficult.  no documentation   Duration  Progress to 30 minutes of  aerobic without signs/symptoms of physical distress  Progress to 30 minutes of  aerobic without signs/symptoms of physical distress   Intensity  THRR unchanged  THRR unchanged       Progression    Row Name 12/06/17 1332 12/20/17 1328   Progression  Continue to progress workloads to maintain intensity without signs/symptoms of physical distress.  Continue to progress workloads to maintain intensity without signs/symptoms of physical distress.   Average METs  2.9  3.1       Resistance Training    Row Name 12/06/17 1332 12/20/17 1328   Training Prescription  No  Yes   Weight  no documentation  4lbs   Reps  no documentation  10-15   Time  no documentation  10 Minutes       Interval Training    Row Name 12/06/17 1332 12/20/17 1328   Interval Training  No  No       Bike    Row Name 12/06/17 1332 12/20/17 1328   Level  0.5 Decrease workload, too hard.  0.5   Minutes  10  10   METs  2.34  2.33       NuStep    Row Name 12/06/17 1332 12/20/17 1328   Level  3  4   SPM  85  85   Minutes  10  10   METs  2.6  3.3       Track    Row Name 12/06/17 1332 12/20/17 1328   Laps  16  16   Minutes  10  10   METs  3.79  3.79          Exercise Comments: Exercise Comments    Row Name 12/06/17 1420 12/20/17 1353   Exercise Comments  Patient tolerated exercise fairly well, decreased workload on aridyne due to dificulty.  Reviewed METs and goals with patient.      Exercise Goals and Review: Exercise Goals    Exercise Goals    Row Name 11/30/17 1524   Increase Physical Activity  Yes   Intervention  Provide advice, education, support and counseling about physical activity/exercise needs.;Develop an individualized exercise prescription for aerobic and resistive training based on initial evaluation findings, risk stratification, comorbidities and participant's personal goals.   Expected Outcomes  Short Term: Attend  rehab on a regular basis to increase amount of physical activity.;Long Term: Exercising regularly at least 3-5 days a week.;Long Term: Add in home exercise to make exercise part of routine and to increase amount of physical activity.   Increase Strength and Stamina  Yes   Intervention  Provide advice, education, support and counseling about physical activity/exercise needs.;Develop an individualized exercise prescription for aerobic and resistive training based on initial evaluation findings, risk stratification, comorbidities and participant's personal goals.  Expected Outcomes  Short Term: Increase workloads from initial exercise prescription for resistance, speed, and METs.;Short Term: Perform resistance training exercises routinely during rehab and add in resistance training at home;Long Term: Improve cardiorespiratory fitness, muscular endurance and strength as measured by increased METs and functional capacity (6MWT)   Able to understand and use rate of perceived exertion (RPE) scale  Yes   Intervention  Provide education and explanation on how to use RPE scale   Expected Outcomes  Short Term: Able to use RPE daily in rehab to express subjective intensity level;Long Term:  Able to use RPE to guide intensity level when exercising independently   Knowledge and understanding of Target Heart Rate Range (THRR)  Yes   Intervention  Provide education and explanation of THRR including how the numbers were predicted and where they are located for reference   Expected Outcomes  Long Term: Able to use THRR to govern intensity when exercising independently;Short Term: Able to state/look up THRR;Short Term: Able to use daily as guideline for intensity in rehab   Able to check pulse independently  Yes   Intervention  Provide education and demonstration on how to check pulse in carotid and radial arteries.;Review the importance of being able to check your own pulse for safety during independent exercise    Expected Outcomes  Short Term: Able to explain why pulse checking is important during independent exercise;Long Term: Able to check pulse independently and accurately   Understanding of Exercise Prescription  Yes   Intervention  Provide education, explanation, and written materials on patient's individual exercise prescription   Expected Outcomes  Short Term: Able to explain program exercise prescription;Long Term: Able to explain home exercise prescription to exercise independently          Exercise Goals Re-Evaluation : Exercise Goals Re-Evaluation    Exercise Goal Re-Evaluation    Row Name 12/06/17 1420 12/20/17 1353   Exercise Goals Review  Able to understand and use rate of perceived exertion (RPE) scale  Able to understand and use rate of perceived exertion (RPE) scale;Increase Physical Activity   Comments  Patient able to understand and use RPE scale appropriately.  Patient is walking 30 minutes, 2-3 days/week in addition to exercise at cardiac rehab.   Expected Outcomes  Increase workloads as tolerated to help improve endurance and cardiac function.  Patient will continue current exercise routine: 30 minutes, 5-6 days/week to help achieve personal health and fitness goals.          Discharge Exercise Prescription (Final Exercise Prescription Changes): Exercise Prescription Changes - 12/20/17 1328    Response to Exercise          Blood Pressure (Admit)  138/70    Blood Pressure (Exercise)  142/62    Blood Pressure (Exit)  130/64    Heart Rate (Admit)  85 bpm    Heart Rate (Exercise)  111 bpm    Heart Rate (Exit)  84 bpm    Rating of Perceived Exertion (Exercise)  13    Symptoms  none    Duration  Progress to 30 minutes of  aerobic without signs/symptoms of physical distress    Intensity  THRR unchanged        Progression          Progression  Continue to progress workloads to maintain intensity without signs/symptoms of physical distress.    Average METs  3.1         Resistance Training  Training Prescription  Yes    Weight  4lbs    Reps  10-15    Time  10 Minutes        Interval Training          Interval Training  No        Bike          Level  0.5    Minutes  10    METs  2.33        NuStep          Level  4    SPM  85    Minutes  10    METs  3.3        Track          Laps  16    Minutes  10    METs  3.79           Nutrition:  Target Goals: Understanding of nutrition guidelines, daily intake of sodium <1551m, cholesterol <2022m calories 30% from fat and 7% or less from saturated fats, daily to have 5 or more servings of fruits and vegetables.  Biometrics: Pre Biometrics - 11/30/17 1340    Pre Biometrics          Height  _0  (1.753 m)    Weight  70.4 kg    Waist Circumference  34 inches    Hip Circumference  38 inches    Waist to Hip Ratio  0.89 %    BMI (Calculated)  22.91    Triceps Skinfold  12 mm    % Body Fat  22.4 %    Grip Strength  36 kg    Flexibility  0 in    Single Leg Stand  3.31 seconds            Nutrition Therapy Plan and Nutrition Goals: Nutrition Therapy & Goals - 11/30/17 1438    Nutrition Therapy          Diet  heart healthy, consistent carbohydrate        Personal Nutrition Goals          Nutrition Goal  Pt to identify and limit food sources of saturated fat, trans fat, refined carbohydrates and sodium    Personal Goal #2  Pt able to name foods that affect blood glucose        Intervention Plan          Intervention  Prescribe, educate and counsel regarding individualized specific dietary modifications aiming towards targeted core components such as weight, hypertension, lipid management, diabetes, heart failure and other comorbidities.    Expected Outcomes  Short Term Goal: Understand basic principles of dietary content, such as calories, fat, sodium, cholesterol and nutrients.;Long Term Goal: Adherence to prescribed nutrition plan.           Nutrition  Assessments: Nutrition Assessments - 11/30/17 1439    MEDFICTS Scores          Pre Score  15           Nutrition Goals Re-Evaluation:   Nutrition Goals Re-Evaluation:   Nutrition Goals Discharge (Final Nutrition Goals Re-Evaluation):   Psychosocial: Target Goals: Acknowledge presence or absence of significant depression and/or stress, maximize coping skills, provide positive support system. Participant is able to verbalize types and ability to use techniques and skills needed for reducing stress and depression.  Initial Review & Psychosocial Screening: Initial Psych Review & Screening - 11/30/17 1502    Initial Review  Current issues with  None Identified        Family Dynamics          Good Support System?  Yes   spouse, family, friends        Barriers          Psychosocial barriers to participate in program  There are no identifiable barriers or psychosocial needs.        Screening Interventions          Interventions  Encouraged to exercise           Quality of Life Scores: Quality of Life - 11/30/17 1503    Quality of Life          Select  Quality of Life        Quality of Life Scores          Health/Function Pre  14.73 %    Socioeconomic Pre  23.19 %    Psych/Spiritual Pre  19.5 %    Family Pre  24 %    GLOBAL Pre  18.94 %          Scores of 19 and below usually indicate a poorer quality of life in these areas.  A difference of  2-3 points is a clinically meaningful difference.  A difference of 2-3 points in the total score of the Quality of Life Index has been associated with significant improvement in overall quality of life, self-image, physical symptoms, and general health in studies assessing change in quality of life.  PHQ-9: Recent Review Flowsheet Data    Depression screen [facility] 2/9 02/27/2016 02/07/2014   Decreased Interest 0 0   Down, Depressed, Hopeless 0 0   PHQ - 2 Score 0 0     Interpretation of Total Score  Total  Score Depression Severity:  1-4 = Minimal depression, 5-9 = Mild depression, 10-14 = Moderate depression, 15-19 = Moderately severe depression, 20-27 = Severe depression   Psychosocial Evaluation and Intervention: Psychosocial Evaluation - 12/06/17 1445    Psychosocial Evaluation & Interventions          Interventions  Encouraged to exercise with the program and follow exercise prescription    Comments  no psychosocial needs identified, no interventions necessary . pt enjoys reading and researching things.     Expected Outcomes  pt will exhibit positive outlook with good coping skills.     Continue Psychosocial Services   No Follow up required           Psychosocial Re-Evaluation: Psychosocial Re-Evaluation    Psychosocial Re-Evaluation    Row Name 12/23/17 [phone]   Current issues with  None Identified   Comments  no psychosocial needs identified, no interventions necessary    Expected Outcomes  pt will exhibit positive outlook with good coping skills.    Interventions  Encouraged to attend Cardiac Rehabilitation for the exercise   Continue Psychosocial Services   No Follow up required          Psychosocial Discharge (Final Psychosocial Re-Evaluation): Psychosocial Re-Evaluation - 12/23/17 0734    Psychosocial Re-Evaluation          Current issues with  None Identified    Comments  no psychosocial needs identified, no interventions necessary     Expected Outcomes  pt will exhibit positive outlook with good coping skills.     Interventions  Encouraged to attend Cardiac Rehabilitation for the exercise    Continue Psychosocial Services   No Follow up required  Vocational Rehabilitation: Provide vocational rehab assistance to qualifying candidates.   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 Participant will state understanding/return demonstration of topics presented.  Learning Barriers/Preferences:   Education Topics: Count Your Pulse:  -Group instruction provided by verbal instruction, demonstration, patient participation and written materials to support subject.  Instructors address importance of being able to find your pulse and how to count your pulse when at home without a heart monitor.  Patients get hands on experience counting their pulse with staff help and individually.   Heart Attack, Angina, and Risk Factor Modification:  -Group instruction provided by verbal instruction, video, and written materials to support subject.  Instructors address signs and symptoms of angina and heart attacks.    Also discuss risk factors for heart disease and how to make changes to improve heart health risk factors.   Functional Fitness:  -Group instruction provided by verbal instruction, demonstration, patient participation, and written materials to support subject.  Instructors address safety measures for doing things around the house.  Discuss how to get up and down off the floor, how to pick things up properly, how to safely get out of a chair without assistance, and balance training.   Meditation and Mindfulness:  -Group instruction provided by verbal instruction, patient participation, and written materials to support subject.  Instructor addresses importance of mindfulness and meditation practice to help reduce stress and improve awareness.  Instructor also leads participants through a meditation exercise.    Stretching for Flexibility and Mobility:  -Group instruction provided by verbal instruction, patient participation, and written materials to support subject.  Instructors lead participants through series of stretches that are designed to  increase flexibility thus improving mobility.  These stretches are additional exercise for major muscle groups that are typically performed during regular warm up and cool down.   Hands Only CPR:  -Group verbal, video, and participation provides a basic overview of AHA guidelines for community CPR. Role-play of emergencies allow participants the opportunity to practice calling for help and chest compression technique with discussion of AED use.   Hypertension: -Group verbal and written instruction that provides a basic overview of hypertension including the most recent diagnostic guidelines, risk factor reduction with self-care instructions and medication management.    Nutrition I class: Heart Healthy Eating:  -Group instruction provided by PowerPoint slides, verbal discussion, and written materials to support subject matter. The instructor gives an explanation and review of the Therapeutic Lifestyle Changes diet recommendations, which includes a discussion on lipid goals, dietary fat, sodium, fiber, plant stanol/sterol esters, sugar, and the components of a well-balanced, healthy diet.   Nutrition II class: Lifestyle Skills:  -Group instruction provided by PowerPoint slides, verbal discussion, and written materials to support subject matter. The instructor gives an explanation and review of label reading, grocery shopping for heart health, heart healthy recipe modifications, and ways to make healthier choices when eating out.   Diabetes Question & Answer:  -Group instruction provided by PowerPoint slides, verbal discussion, and written materials to support subject matter. The instructor gives an explanation and review of diabetes co-morbidities, pre- and post-prandial blood glucose goals, pre-exercise blood glucose goals, signs,  symptoms, and treatment of hypoglycemia and hyperglycemia, and foot care basics.   Diabetes Blitz:  -Group instruction provided by PowerPoint slides, verbal  discussion, and written materials to support subject matter. The instructor gives an explanation and review of the physiology behind type 1 and type 2 diabetes, diabetes medications and rational behind using different medications, pre- and post-prandial blood glucose recommendations and Hemoglobin A1c goals, diabetes diet, and exercise including blood glucose guidelines for exercising safely.    Portion Distortion:  -Group instruction provided by PowerPoint slides, verbal discussion, written materials, and food models to support subject matter. The instructor gives an explanation of serving size versus portion size, changes in portions sizes over the last 20 years, and what consists of a serving from each food group.   Stress Management:  -Group instruction provided by verbal instruction, video, and written materials to support subject matter.  Instructors review role of stress in heart disease and how to cope with stress positively.     Exercising on Your Own:  -Group instruction provided by verbal instruction, power point, and written materials to support subject.  Instructors discuss benefits of exercise, components of exercise, frequency and intensity of exercise, and end points for exercise.  Also discuss use of nitroglycerin and activating EMS.  Review options of places to exercise outside of rehab.  Review guidelines for sex with heart disease.   Cardiac Drugs I:  -Group instruction provided by verbal instruction and written materials to support subject.  Instructor reviews cardiac drug classes: antiplatelets, anticoagulants, beta blockers, and statins.  Instructor discusses reasons, side effects, and lifestyle considerations for each drug class.   Cardiac Drugs II:  -Group instruction provided by verbal instruction and written materials to support subject.  Instructor reviews cardiac drug classes: angiotensin converting enzyme inhibitors (ACE-I), angiotensin II receptor blockers (ARBs),  nitrates, and calcium channel blockers.  Instructor discusses reasons, side effects, and lifestyle considerations for each drug class.   Anatomy and Physiology of the Circulatory System:  Group verbal and written instruction and models provide basic cardiac anatomy and physiology, with the coronary electrical and arterial systems. Review of: AMI, Angina, Valve disease, Heart Failure, Peripheral Artery Disease, Cardiac Arrhythmia, Pacemakers, and the ICD.   Other Education:  -Group or individual verbal, written, or video instructions that support the educational goals of the cardiac rehab program.   Holiday Eating Survival Tips:  -Group instruction provided by PowerPoint slides, verbal discussion, and written materials to support subject matter. The instructor gives patients tips, tricks, and techniques to help them not only survive but enjoy the holidays despite the onslaught of food that accompanies the holidays.   Knowledge Questionnaire Score: Knowledge Questionnaire Score - 11/30/17 1503    Knowledge Questionnaire Score          Pre Score  22/24           Core Components/Risk Factors/Patient Goals at Admission: Personal Goals and Risk Factors at Admission - 11/30/17 1522    Core Components/Risk Factors/Patient Goals on Admission          Improve shortness of breath with ADL's  Yes    Intervention  Provide education, individualized exercise plan and daily activity instruction to help decrease symptoms of SOB with activities of daily living.    Expected Outcomes  Short Term: Improve cardiorespiratory fitness to achieve a reduction of symptoms when performing ADLs;Long Term: Be able to perform more ADLs without symptoms or delay the onset of symptoms    Intervention  Provide education on  lifestyle modifcations including regular physical activity/exercise, weight management, moderate sodium restriction and increased consumption of fresh fruit, vegetables, and low fat dairy, alcohol  moderation, and smoking cessation.;Monitor prescription use compliance.    Expected Outcomes  Short Term: Continued assessment and intervention until BP is < 140/57m HG in hypertensive participants. < 130/844mHG in hypertensive participants with diabetes, heart failure or chronic kidney disease.;Long Term: Maintenance of blood pressure at goal levels.    Lipids  Yes    Intervention  Provide education and support for participant on nutrition & aerobic/resistive exercise along with prescribed medications to achieve LDL <7075mHDL >34m59m  Expected Outcomes  Short Term: Participant states understanding of desired cholesterol values and is compliant with medications prescribed. Participant is following exercise prescription and nutrition guidelines.;Long Term: Cholesterol controlled with medications as prescribed, with individualized exercise RX and with personalized nutrition plan. Value goals: LDL < 70mg47mL > 40 mg.    Stress  Yes    Intervention  Offer individual and/or small group education and counseling on adjustment to heart disease, stress management and health-related lifestyle change. Teach and support self-help strategies.;Refer participants experiencing significant psychosocial distress to appropriate mental health specialists for further evaluation and treatment. When possible, include family members and significant others in education/counseling sessions.    Expected Outcomes  Short Term: Participant demonstrates changes in health-related behavior, relaxation and other stress management skills, ability to obtain effective social support, and compliance with psychotropic medications if prescribed.;Long Term: Emotional wellbeing is indicated by absence of clinically significant psychosocial distress or social isolation.    Personal Goal Other  Yes    Personal Goal  To improve endurance and breathing and improve cardiac function    Intervention  Will monitor progress in porgram    Expected  Outcomes  patient will have an improvement in endurance upon completion of cardiac rehab           Core Components/Risk Factors/Patient Goals Review:  Goals and Risk Factor Review    Core Components/Risk Factors/Patient Goals Review    Row Name 12/06/17 1523 12/23/17 0735   Personal Goals Review  Improve shortness of breath with ADL's;Hypertension;Lipids;Stress;Other  Improve shortness of breath with ADL's;Hypertension;Lipids;Stress;Other   Review  pt with multiple CAD RF demonstrates eagerness to participate in CR program.pt personal goal to improve cardiac risk stratification, improve circulation and overall heart health.   pt with multiple CAD RF demonstrates eagerness to participate in CR program.pt personal goal to improve cardiac risk stratification, improve circulation and overall heart health. pt exhibits improvement with functional ability by increased workloads at CR and navigating stairs at home with less difficulty.      Expected Outcomes  pt will participate in CR exercise, nutrition and lifestyle modification to decrease overall RF.   pt will participate in CR exercise, nutrition and lifestyle modification to decrease overall RF.           Core Components/Risk Factors/Patient Goals at Discharge (Final Review):  Goals and Risk Factor Review - 12/23/17 0735    Core Components/Risk Factors/Patient Goals Review          Personal Goals Review  Improve shortness of breath with ADL's;Hypertension;Lipids;Stress;Other    Review  pt with multiple CAD RF demonstrates eagerness to participate in CR program.pt personal goal to improve cardiac risk stratification, improve circulation and overall heart health. pt exhibits improvement with functional ability by increased workloads at CR and navigating stairs at home with less difficulty.  Expected Outcomes  pt will participate in CR exercise, nutrition and lifestyle modification to decrease overall RF.            ITP Comments: ITP  Comments    Row Name 11/29/17 1656 12/06/17 1442 12/23/17 0733   ITP Comments  Dr. Fransico Him, Medical Director   pt started group exercise. pt tolerated light activity without difficulty. pt oriented to exercise equipment and safety routine.   30 day ITP review. pt with good attendance and participation.       Comments:

## 2017-12-23 NOTE — Telephone Encounter (Signed)
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 He states that with his insurance he will be responsible for 1/3 of the cash price of the drug (cash price ~$11,000/mo). I talked with him about the outcomes of the clinical trials and the symptomatic improvement we have seen in the patients we see in clinic. He was grateful for the information and will call with any other concerns he has.   [name], PharmD, BCPS, CPP Clinical Pharmacist Phone: [phone] 12/23/2017 4:31 PM

## 2017-12-24 ENCOUNTER — Encounter (HOSPITAL_COMMUNITY): Payer: Medicare Other

## 2017-12-27 ENCOUNTER — Encounter (HOSPITAL_COMMUNITY): Payer: Medicare Other

## 2017-12-27 ENCOUNTER — Encounter (HOSPITAL_COMMUNITY)
Admission: RE | Admit: 2017-12-27 | Discharge: 2017-12-27 | Disposition: A | Discharge status: Home / Self Care | Payer: Medicare Other | Source: Ambulatory Visit | Attending: Cardiology | Admitting: Cardiology

## 2017-12-27 DIAGNOSIS — I5022 Chronic systolic (congestive) heart failure: Secondary | ICD-10-CM

## 2017-12-27 MED FILL — EPLERENONE 25 MG TABLET: 25 | 30 days supply | Qty: 30 | Fill #2

## 2017-12-29 ENCOUNTER — Encounter (HOSPITAL_COMMUNITY): Payer: Medicare Other

## 2017-12-29 ENCOUNTER — Encounter (HOSPITAL_COMMUNITY)
Admission: RE | Admit: 2017-12-29 | Discharge: 2017-12-29 | Disposition: A | Discharge status: Home / Self Care | Payer: Medicare Other | Source: Ambulatory Visit | Attending: Cardiology | Admitting: Cardiology

## 2017-12-29 DIAGNOSIS — I5022 Chronic systolic (congestive) heart failure: Secondary | ICD-10-CM | POA: Diagnosis not present

## 2017-12-29 NOTE — Telephone Encounter (Signed)
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 Unfortunately, he was ineligible for 2 patient assistance foundations d/t income (including PANF) and a 3rd was closed. Will attempt tier exception to see if this would bring the copay down.   [name], PharmD, BCPS, CPP Clinical Pharmacist Phone: [phone] 12/29/2017 4:04 PM

## 2017-12-29 NOTE — Telephone Encounter (Signed)
Pt left VM requesting call from [place] to discuss patient assistance.

## 2017-12-31 ENCOUNTER — Encounter (HOSPITAL_COMMUNITY): Payer: Medicare Other

## 2018-01-03 ENCOUNTER — Encounter (HOSPITAL_COMMUNITY): Payer: Medicare Other

## 2018-01-03 ENCOUNTER — Encounter (HOSPITAL_COMMUNITY)
Admission: RE | Admit: 2018-01-03 | Discharge: 2018-01-03 | Disposition: A | Discharge status: Home / Self Care | Payer: Medicare Other | Source: Ambulatory Visit | Attending: Cardiology | Admitting: Cardiology

## 2018-01-03 ENCOUNTER — Encounter: Payer: Self-pay | Admitting: *Deleted

## 2018-01-03 DIAGNOSIS — I5022 Chronic systolic (congestive) heart failure: Secondary | ICD-10-CM | POA: Diagnosis not present

## 2018-01-04 ENCOUNTER — Telehealth (HOSPITAL_COMMUNITY): Payer: Self-pay | Admitting: Pharmacist

## 2018-01-04 NOTE — Telephone Encounter (Signed)
Opsumit tier exception was denied since tier exceptions are not eligible for specialty medications and it will continue to be covered at the tier 5 copay. Patient aware. Will call his insurance company and get a better idea of his copays and also contact our drug rep to see if he would qualify for any assistance.   [name], PharmD, BCPS, CPP Clinical Pharmacist Phone: [phone] 01/04/2018 11:35 AM

## 2018-01-05 ENCOUNTER — Encounter (HOSPITAL_COMMUNITY): Payer: Medicare Other

## 2018-01-05 ENCOUNTER — Encounter (HOSPITAL_COMMUNITY)
Admission: RE | Admit: 2018-01-05 | Discharge: 2018-01-05 | Disposition: A | Discharge status: Home / Self Care | Payer: Medicare Other | Source: Ambulatory Visit | Attending: Cardiology | Admitting: Cardiology

## 2018-01-05 DIAGNOSIS — I5022 Chronic systolic (congestive) heart failure: Secondary | ICD-10-CM

## 2018-01-07 ENCOUNTER — Encounter (HOSPITAL_COMMUNITY): Payer: Medicare Other

## 2018-01-07 ENCOUNTER — Telehealth (HOSPITAL_COMMUNITY): Payer: Self-pay | Admitting: Pharmacist

## 2018-01-07 NOTE — Telephone Encounter (Signed)
Caleb [name] is still unsure about whether or not to start Opsumit d/t the side effects and cost of the medication. I have tried to get more concrete information about what his copay would be but I won't have access to that information until his Rx is sent to a specialty pharmacy which won't happen until it is initiated by [facility] pharmacy. They will send him the first 30 days at no charge and then it will be forwarded to his insurance company's preferred specialty pharmacy. At that point, we will have a better idea about his copay cost. He is agreeable to at least having [facility] send him the first 30 days. He will call them today at [phone] to have it shipped.   [name], PharmD, BCPS, CPP Clinical Pharmacist Phone: [phone] 01/07/2018 3:37 PM

## 2018-01-10 ENCOUNTER — Encounter (HOSPITAL_COMMUNITY)
Admission: RE | Admit: 2018-01-10 | Discharge: 2018-01-10 | Disposition: A | Discharge status: Home / Self Care | Payer: Medicare Other | Source: Ambulatory Visit | Attending: Cardiology | Admitting: Cardiology

## 2018-01-10 ENCOUNTER — Encounter (HOSPITAL_COMMUNITY): Payer: Medicare Other

## 2018-01-10 DIAGNOSIS — I5022 Chronic systolic (congestive) heart failure: Secondary | ICD-10-CM | POA: Diagnosis not present

## 2018-01-11 ENCOUNTER — Telehealth (HOSPITAL_COMMUNITY): Payer: Self-pay | Admitting: Pharmacist

## 2018-01-11 NOTE — Telephone Encounter (Signed)
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   ID: [id number] Grp: [id number]  [name], PharmD, BCPS, CPP Clinical Pharmacist Phone: [phone] 01/11/2018 11:27 AM

## 2018-01-12 ENCOUNTER — Encounter (HOSPITAL_COMMUNITY): Payer: Medicare Other

## 2018-01-12 ENCOUNTER — Encounter (HOSPITAL_COMMUNITY)
Admission: RE | Admit: 2018-01-12 | Discharge: 2018-01-12 | Disposition: A | Discharge status: Home / Self Care | Payer: Medicare Other | Source: Ambulatory Visit | Attending: Cardiology | Admitting: Cardiology

## 2018-01-12 DIAGNOSIS — I5022 Chronic systolic (congestive) heart failure: Secondary | ICD-10-CM | POA: Diagnosis not present

## 2018-01-12 MED FILL — TORSEMIDE 10 MG TABLET: 10 | 30 days supply | Qty: 150 | Fill #1

## 2018-01-13 ENCOUNTER — Ambulatory Visit (INDEPENDENT_AMBULATORY_CARE_PROVIDER_SITE_OTHER): Payer: Medicare Other

## 2018-01-13 DIAGNOSIS — Z23 Encounter for immunization: Secondary | ICD-10-CM

## 2018-01-14 ENCOUNTER — Encounter (HOSPITAL_COMMUNITY): Payer: Medicare Other

## 2018-01-17 ENCOUNTER — Encounter (HOSPITAL_COMMUNITY)
Admission: RE | Admit: 2018-01-17 | Discharge: 2018-01-17 | Disposition: A | Discharge status: Home / Self Care | Payer: Medicare Other | Source: Ambulatory Visit | Attending: Cardiology | Admitting: Cardiology

## 2018-01-17 ENCOUNTER — Encounter (HOSPITAL_COMMUNITY): Payer: Medicare Other

## 2018-01-17 DIAGNOSIS — I5022 Chronic systolic (congestive) heart failure: Secondary | ICD-10-CM

## 2018-01-19 ENCOUNTER — Encounter (HOSPITAL_COMMUNITY): Payer: Medicare Other

## 2018-01-19 ENCOUNTER — Ambulatory Visit (INDEPENDENT_AMBULATORY_CARE_PROVIDER_SITE_OTHER): Payer: Medicare Other | Admitting: Family Medicine

## 2018-01-19 ENCOUNTER — Encounter: Payer: Self-pay | Admitting: Family Medicine

## 2018-01-19 ENCOUNTER — Encounter (HOSPITAL_COMMUNITY)
Admission: RE | Admit: 2018-01-19 | Discharge: 2018-01-19 | Disposition: A | Discharge status: Home / Self Care | Payer: Medicare Other | Source: Ambulatory Visit | Attending: Cardiology | Admitting: Cardiology

## 2018-01-19 VITALS — BP 128/58 | HR 68 | Temp 97.8°F | Ht 69.0 in | Wt 154.0 lb

## 2018-01-19 DIAGNOSIS — I5022 Chronic systolic (congestive) heart failure: Secondary | ICD-10-CM | POA: Diagnosis not present

## 2018-01-19 DIAGNOSIS — I272 Pulmonary hypertension, unspecified: Secondary | ICD-10-CM | POA: Diagnosis not present

## 2018-01-19 DIAGNOSIS — R197 Diarrhea, unspecified: Secondary | ICD-10-CM | POA: Diagnosis not present

## 2018-01-19 NOTE — Patient Instructions (Addendum)
Read about prevnar 13 and can call in for nurse visit if you want to do this  Stop by lab before you go- to pick up stool kit- you will need to bring the kit back here unfortunately   Good to see you today! Thanks for coming in.

## 2018-01-19 NOTE — Progress Notes (Signed)
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  No chest pain.  Some shortness of breath.  Some intermittent edema mild.  Past Medical History-  Patient Active Problem List   Diagnosis Date Noted  . Episodic lightheadedness 09/03/2016    Priority: High  . Pulmonary hypertension ([place]) 05/24/2014    Priority: High  . S/P CABG x 1 02/09/2013    Priority: High  . Cardiomyopathy ([place]) 01/06/2013    Priority: High  . Chronic systolic heart failure ([place]) 09/11/2010    Priority: High  . Coronary atherosclerosis 03/07/2008    Priority: High  . ATRIAL FIBRILLATION 02/14/2007    Priority: High  . S/P aortic valve repair 02/09/2013    Priority: Medium  . S/P MVR (mitral valve repair) 02/09/2013    Priority: Medium  . OSA (obstructive sleep apnea) 07/13/2012    Priority: Medium  . PSA, INCREASED 12/17/2008    Priority: Medium  . SINUS BRADYCARDIA 08/15/2008    Priority: Medium  . Hyperlipidemia 03/07/2008    Priority: Medium  . Essential hypertension 03/07/2008    Priority: Medium  . Intractable hiccups 06/10/2015    Priority: Low  . Gross hematuria 02/07/2014    Priority: Low  . Age-related macular degeneration, dry 08/06/2013    Priority: Low  . H/O shoulder surgery 01/06/2013    Priority: Low  . Dyspnea on exertion 11/25/2012    Priority: Low  . Anxiety 01/26/2011    Priority: Low  . POSITIONAL VERTIGO 07/13/2008    Priority: Low  . GERD 03/11/2008    Priority: Low  . ALLERGIC RHINITIS 03/07/2008    Priority: Low  . Osteoarthritis 01/01/2017    Medications- reviewed and updated Current Outpatient Medications  Medication Sig Dispense Refill  . aspirin EC 81 MG tablet Take 1 tablet (81 mg total) by mouth daily. 30 tablet 3  . eplerenone (INSPRA) 25 MG tablet Take 1 tablet (25 mg total) by mouth daily. 30 tablet 11  . magnesium gluconate  (MAGONATE) 500 MG tablet Take 500 mg by mouth 2 (two) times daily.     . Omega-3 Fatty Acids (FISH OIL PO) Take 360 mg by mouth daily.     . potassium chloride (K-DUR,KLOR-CON) 10 MEQ tablet Take 2 tablets (20 mEq total) by mouth daily. 60 tablet 3  . torsemide (DEMADEX) 10 MG tablet Take 4 tablets (40 mg total) by mouth daily. May also take 1 tablet (10 mg total) as needed (sob/edema). 150 tablet 1  . valsartan (DIOVAN) 40 MG tablet TAKE 1 AND 1/2 TABLETS BY MOUTH IN THE MORNING AND 2 TABLETS IN THE EVENING (Patient taking differently: Take 40 mg by mouth 2 (two) times daily. ) 105 tablet 3  . macitentan (OPSUMIT) 10 MG tablet Take 10 mg by mouth daily.     No current facility-administered medications for this visit.     Objective: BP (!) 128/58 (BP Location: Left Arm, Patient Position: Sitting, Cuff Size: Large)   Pulse 68   Temp 97.8 F (36.6 C) (Oral)   Ht _0  (1.753 m)   Wt 154 lb (69.9 kg)   SpO2 98%   BMI 22.74 kg/m  Gen: NAD, resting comfortably CV: RRR no rubs or gallops. 2/6 SEM noted.  Lungs: CTAB no crackles, wheeze, rhonchi- other than faint crackles at bases of bilateral lungs Ext: no edema on right, trace  on left Skin: warm, dry  Assessment/Plan:  Other notes: 1.  Advised Prevnar 13-he would like to read on this first 2.  Patient has some stress related to his pulmonary hypertension diagnosis.  He has been doing a lot of reading and research and we spent time in counseling about these subjects.  He is hopeful that stem cells may be able to used for his condition in the future.  He also has not started theospumit per cardiology recommendations-I encouraged him to start.  He is contemplative.  Diarrhea S:  Patient has been somewhat concerned by GI tract changes after his first round of antibiotics for UTi about a year ago (first time even with BPH). Ended up having to be on 2 different antibiotics. Another compounding factor is that he increased his magnesium through  cardiologist at [facility]. Loose stools, gets some gas.  2-3 loose Bms each day. Sometimes can barely hold it- some urgency. Has felt more of a bulge in lower abdomen.  A/P: 77 year old male with prolonged diarrhea. He would like to have stool checked for bacteria (stool culture) and C. Diff- this is reasonable but we discussed may simply be change in his gut flora after prior antibiotic use- could consider probiotic.  Future Appointments  Date Time Provider [place]  03/08/2018  1:40 PM Larey Dresser, MD [facility] None   Lab/Order associations: Diarrhea, unspecified type - Plan: Stool culture, C. Difficile Toxin  Pulmonary hypertension ([place])  Time Stamp The duration of face-to-face time during this visit was greater than 25 minutes. Greater than 50% of this time was spent in counseling, explanation of diagnosis, planning of further management, and/or coordination of care including counseling about stresses of pulmonary hypertension, discussing work-up revolving around the stools, discussing need for Prevnar 13, discussing possible start of medication cardiology recommended.    Return precautions advised.  Garret Reddish, MD

## 2018-01-20 ENCOUNTER — Encounter (HOSPITAL_COMMUNITY): Payer: Self-pay

## 2018-01-20 NOTE — Progress Notes (Signed)
Cardiac Individual Treatment Plan  Patient Details  Name: Caleb RAIMONDI, PhD MRN: [id number] Date of Birth: [birth year] Referring Provider:   Flowsheet Row CARDIAC REHAB PHASE II ORIENTATION from 11/30/2017 in [facility]  Referring Provider  Loralie Champagne, MD.      Initial Encounter Date:  Flowsheet Row CARDIAC REHAB PHASE II ORIENTATION from 11/30/2017 in [facility]  Date  11/30/17      Visit Diagnosis: 77/4128 Chronic systolic congestive heart failure ([place])  Patient's Home Medications on Admission:  Current Outpatient Medications:  .  aspirin EC 81 MG tablet, Take 1 tablet (81 mg total) by mouth daily., Disp: 30 tablet, Rfl: 3 .  eplerenone (INSPRA) 25 MG tablet, Take 1 tablet (25 mg total) by mouth daily., Disp: 30 tablet, Rfl: 11 .  macitentan (OPSUMIT) 10 MG tablet, Take 10 mg by mouth daily., Disp: , Rfl:  .  magnesium gluconate (MAGONATE) 500 MG tablet, Take 500 mg by mouth 2 (two) times daily. , Disp: , Rfl:  .  Omega-3 Fatty Acids (FISH OIL PO), Take 360 mg by mouth daily. , Disp: , Rfl:  .  potassium chloride (K-DUR,KLOR-CON) 10 MEQ tablet, Take 2 tablets (20 mEq total) by mouth daily., Disp: 60 tablet, Rfl: 3 .  torsemide (DEMADEX) 10 MG tablet, Take 4 tablets (40 mg total) by mouth daily. May also take 1 tablet (10 mg total) as needed (sob/edema)., Disp: 150 tablet, Rfl: 1 .  valsartan (DIOVAN) 40 MG tablet, TAKE 1 AND 1/2 TABLETS BY MOUTH IN THE MORNING AND 2 TABLETS IN THE EVENING (Patient taking differently: Take 40 mg by mouth 2 (two) times daily. ), Disp: 105 tablet, Rfl: 3  Past Medical History: Past Medical History:  Diagnosis Date  . Allergic rhinitis   . Atrial fibrillation ([place])   . Atypical pneumonia   . CAD (coronary artery disease)   . Cardiomyopathy, ischemic   . Chronic anticoagulation   . Cough   . Dizziness   . GERD (gastroesophageal reflux disease)   . Heart failure, systolic, acute  on chronic ([place])   . Hyperlipidemia   . Hypertension   . OSA (obstructive sleep apnea)    Home sleep test 07/05/2009 AHI 8.2  . Pleural effusion   . Positional vertigo   . TIA (transient ischemic attack)     Tobacco Use: Social History   Tobacco Use  Smoking Status Never Smoker  Smokeless Tobacco Never Used    Labs: Recent Review Flowsheet Data    Labs for ITP Cardiac and Pulmonary Rehab Latest Ref Rng & Units 01/10/2016 01/10/2016 08/17/2017 10/07/2017 11/16/2017   Cholestrol 0 - 200 mg/dL - - 212(H) 206(H) -   LDLCALC 0 - 99 mg/dL - - 140(H) 136(H) -   LDLDIRECT mg/dL - - - - -   HDL >40 mg/dL - - 55 53 -   Trlycerides <150 mg/dL - - 86 84 -   Hemoglobin A1c 4.8 - 5.6 % - - 5.5 - -   PHART 7.350 - 7.450 - - - - -   PCO2ART 35.0 - 45.0 mmHg - - - - -   HCO3 20.0 - 28.0 mmol/L 26.7 26.1 - - 24.8   TCO2 22 - 32 mmol/L 28 27 - - 26   ACIDBASEDEF 0.0 - 2.0 mmol/L - - - - -   O2SAT % 68.0 67.0 - - 69.0      Capillary Blood Glucose: No results found for: GLUCAP  Exercise Target Goals: Exercise Program Goal: Individual exercise prescription set using results from initial 6 min walk test and THRR while considering  patient's activity barriers and safety.   Exercise Prescription Goal: Initial exercise prescription builds to 30-45 minutes a day of aerobic activity, 2-3 days per week.  Home exercise guidelines will be given to patient during program as part of exercise prescription that the participant will acknowledge.  Activity Barriers & Risk Stratification: Activity Barriers & Cardiac Risk Stratification - 11/30/17 1405    Activity Barriers & Cardiac Risk Stratification          Activity Barriers  Shortness of Breath;Other (comment)    Comments  Lightheadedness. OA- hips.    Cardiac Risk Stratification  High           6 Minute Walk: 6 Minute Walk    6 Minute Walk    Row Name 11/30/17 1350   Phase  Initial   Distance  1838 feet   Walk Time  6 minutes   # of  Rest Breaks  0   MPH  3.48   METS  4.13   RPE  15   VO2 Peak  14.44   Symptoms  No   Resting HR  68 bpm   Resting BP  124/70   Resting Oxygen Saturation   97 %   Exercise Oxygen Saturation  during 6 min walk  98 %   Max Ex. HR  118 bpm   Max Ex. BP  164/78   2 Minute Post BP  124/72          Oxygen Initial Assessment:   Oxygen Re-Evaluation:   Oxygen Discharge (Final Oxygen Re-Evaluation):   Initial Exercise Prescription: Initial Exercise Prescription - 11/30/17 1500    Date of Initial Exercise RX and Referring Provider          Date  11/30/17    Referring Provider  Loralie Champagne, MD.        Bike          Level  1    Minutes  10    METs  3.7        NuStep          Level  3    SPM  85    Minutes  10    METs  3.4        Track          Laps  15    Minutes  10    METs  3.41        Prescription Details          Frequency (times per week)  3    Duration  Progress to 30 minutes of continuous aerobic without signs/symptoms of physical distress        Intensity          THRR 40-80% of Max Heartrate  56-113    Ratings of Perceived Exertion  11-13    Perceived Dyspnea  0-4        Progression          Progression  Continue to progress workloads to maintain intensity without signs/symptoms of physical distress.        Resistance Training          Training Prescription  Yes    Weight  3lbs    Reps  10-15           Perform Capillary Blood Glucose checks as needed.  Exercise Prescription  Changes: Exercise Prescription Changes    Response to Exercise    Row Name 12/06/17 1332 12/20/17 1328 01/03/18 1143 01/19/18 1315   Blood Pressure (Admit)  120/60  138/70  138/68  142/60   Blood Pressure (Exercise)  164/74  142/62  142/62  144/82   Blood Pressure (Exit)  126/70  130/64  138/72  130/74   Heart Rate (Admit)  81 bpm  85 bpm  79 bpm  79 bpm   Heart Rate (Exercise)  104 bpm  111 bpm  107 bpm  110 bpm   Heart Rate (Exit)  81 bpm  84 bpm  73  bpm  79 bpm   Rating of Perceived Exertion (Exercise)  _0 Symptoms  none  none  none  none   Comments  Decreased bike workloads, too difficult.  no documentation  no documentation  no documentation   Duration  Progress to 30 minutes of  aerobic without signs/symptoms of physical distress  Progress to 30 minutes of  aerobic without signs/symptoms of physical distress  Progress to 30 minutes of  aerobic without signs/symptoms of physical distress  Progress to 30 minutes of  aerobic without signs/symptoms of physical distress   Intensity  THRR unchanged  THRR unchanged  THRR unchanged  THRR unchanged       Progression    Row Name 12/06/17 1332 12/20/17 1328 01/03/18 1143 01/19/18 1315   Progression  Continue to progress workloads to maintain intensity without signs/symptoms of physical distress.  Continue to progress workloads to maintain intensity without signs/symptoms of physical distress.  Continue to progress workloads to maintain intensity without signs/symptoms of physical distress.  Continue to progress workloads to maintain intensity without signs/symptoms of physical distress.   Average METs  2.9  3.1  3.21  3.67       Resistance Training    Row Name 12/06/17 1332 12/20/17 1328 01/03/18 1143 01/19/18 1315   Training Prescription  No  Yes  Yes  No   Weight  no documentation  4lbs  3lbs  no documentation   Reps  no documentation  10-15  10-15  no documentation   Time  no documentation  10 Minutes  10 Minutes  no documentation       Interval Training    Row Name 12/06/17 1332 12/20/17 1328 01/03/18 1143 01/19/18 1315   Interval Training  No  No  No  No       Bike    Row Name 12/06/17 1332 12/20/17 1328 01/03/18 1143 01/19/18 1315   Level  0.5 Decrease workload, too hard.  0._1 Minutes  _2 METs  2.34  2.33  3.67  3.67       NuStep    Row Name 12/06/17 1332 12/20/17 1328 01/03/18 1143 01/19/18 1315   Level  _3 SPM  85  85  85  85    Minutes  _4 METs  2.6  3.3  3.1  3.2       Arm Ergometer    Row Name 12/06/17 1332 12/20/17 1328 01/03/18 1143 01/19/18 1315   Level  no documentation  no documentation  1  no documentation   Watts  no documentation  no documentation  25  no documentation   Minutes  no documentation  no documentation  10  no documentation   METs  no documentation  no documentation  2.87  no documentation       Track    Row Name 12/06/17 1332 12/20/17 1328 01/03/18 1143 01/19/18 1315   Laps  16  16  no documentation  18   Minutes  10  10  no documentation  10   METs  3.79  3.79  no documentation  4.14          Exercise Comments: Exercise Comments    Row Name 12/06/17 1420 12/20/17 1353 01/05/18 1623 01/20/18 0947   Exercise Comments  Patient tolerated exercise fairly well, decreased workload on aridyne due to dificulty.  Reviewed METs and goals with patient.  Reviewed METs and goals with patient.   Pt is increasing MET level each session. Will continue to exercise at home.       Exercise Goals and Review: Exercise Goals    Exercise Goals    Row Name 11/30/17 1524   Increase Physical Activity  Yes   Intervention  Provide advice, education, support and counseling about physical activity/exercise needs.;Develop an individualized exercise prescription for aerobic and resistive training based on initial evaluation findings, risk stratification, comorbidities and participant's personal goals.   Expected Outcomes  Short Term: Attend rehab on a regular basis to increase amount of physical activity.;Long Term: Exercising regularly at least 3-5 days a week.;Long Term: Add in home exercise to make exercise part of routine and to increase amount of physical activity.   Increase Strength and Stamina  Yes   Intervention  Provide advice, education, support and counseling about physical activity/exercise needs.;Develop an individualized exercise prescription for aerobic and resistive training based  on initial evaluation findings, risk stratification, comorbidities and participant's personal goals.   Expected Outcomes  Short Term: Increase workloads from initial exercise prescription for resistance, speed, and METs.;Short Term: Perform resistance training exercises routinely during rehab and add in resistance training at home;Long Term: Improve cardiorespiratory fitness, muscular endurance and strength as measured by increased METs and functional capacity (6MWT)   Able to understand and use rate of perceived exertion (RPE) scale  Yes   Intervention  Provide education and explanation on how to use RPE scale   Expected Outcomes  Short Term: Able to use RPE daily in rehab to express subjective intensity level;Long Term:  Able to use RPE to guide intensity level when exercising independently   Knowledge and understanding of Target Heart Rate Range (THRR)  Yes   Intervention  Provide education and explanation of THRR including how the numbers were predicted and where they are located for reference   Expected Outcomes  Long Term: Able to use THRR to govern intensity when exercising independently;Short Term: Able to state/look up THRR;Short Term: Able to use daily as guideline for intensity in rehab   Able to check pulse independently  Yes   Intervention  Provide education and demonstration on how to check pulse in carotid and radial arteries.;Review the importance of being able to check your own pulse for safety during independent exercise   Expected Outcomes  Short Term: Able to explain why pulse checking is important during independent exercise;Long Term: Able to check pulse independently and accurately   Understanding of Exercise Prescription  Yes   Intervention  Provide education, explanation, and written materials on patient's individual exercise prescription   Expected Outcomes  Short Term: Able to explain program exercise prescription;Long Term: Able to explain home exercise prescription to  exercise  independently          Exercise Goals Re-Evaluation : Exercise Goals Re-Evaluation    Exercise Goal Re-Evaluation    Row Name 12/06/17 1420 12/20/17 1353 01/05/18 1621 01/20/18 0946   Exercise Goals Review  Able to understand and use rate of perceived exertion (RPE) scale  Able to understand and use rate of perceived exertion (RPE) scale;Increase Physical Activity  Increase Physical Activity;Increase Strength and Stamina;Knowledge and understanding of Target Heart Rate Range (THRR);Able to understand and use rate of perceived exertion (RPE) scale;Understanding of Exercise Prescription;Able to check pulse independently  Increase Physical Activity;Increase Strength and Stamina;Knowledge and understanding of Target Heart Rate Range (THRR);Able to understand and use rate of perceived exertion (RPE) scale;Understanding of Exercise Prescription;Able to check pulse independently   Comments  Patient able to understand and use RPE scale appropriately.  Patient is walking 30 minutes, 2-3 days/week in addition to exercise at cardiac rehab.  Patient will continue to walk 3-4 days per week for 30 minutes in addition to CR exercise.   Pt is increasing MET level and has a MET level of 3.67. Tolerates exercise well. Exercises at home in addition to Cardiac Rehab.    Expected Outcomes  Increase workloads as tolerated to help improve endurance and cardiac function.  Patient will continue current exercise routine: 30 minutes, 5-6 days/week to help achieve personal health and fitness goals.  Will continue to monitor and progress Pt as tolerated.   Will continue to monitor and progress Pt as tolerated.           Discharge Exercise Prescription (Final Exercise Prescription Changes): Exercise Prescription Changes - 01/19/18 1315    Response to Exercise          Blood Pressure (Admit)  142/60    Blood Pressure (Exercise)  144/82    Blood Pressure (Exit)  130/74    Heart Rate (Admit)  79 bpm    Heart Rate  (Exercise)  110 bpm    Heart Rate (Exit)  79 bpm    Rating of Perceived Exertion (Exercise)  13    Symptoms  none    Duration  Progress to 30 minutes of  aerobic without signs/symptoms of physical distress    Intensity  THRR unchanged        Progression          Progression  Continue to progress workloads to maintain intensity without signs/symptoms of physical distress.    Average METs  3.67        Resistance Training          Training Prescription  No        Interval Training          Interval Training  No        Bike          Level  1    Minutes  10    METs  3.67        NuStep          Level  4    SPM  85    Minutes  10    METs  3.2        Track          Laps  18    Minutes  10    METs  4.14           Nutrition:  Target Goals: Understanding of nutrition guidelines, daily intake of sodium <159m, cholesterol <2048m calories 30% from fat and  7% or less from saturated fats, daily to have 5 or more servings of fruits and vegetables.  Biometrics: Pre Biometrics - 11/30/17 1340    Pre Biometrics          Height  _0  (1.753 m)    Weight  70.4 kg    Waist Circumference  34 inches    Hip Circumference  38 inches    Waist to Hip Ratio  0.89 %    BMI (Calculated)  22.91    Triceps Skinfold  12 mm    % Body Fat  22.4 %    Grip Strength  36 kg    Flexibility  0 in    Single Leg Stand  3.31 seconds            Nutrition Therapy Plan and Nutrition Goals: Nutrition Therapy & Goals - 11/30/17 1438    Nutrition Therapy          Diet  heart healthy, consistent carbohydrate        Personal Nutrition Goals          Nutrition Goal  Pt to identify and limit food sources of saturated fat, trans fat, refined carbohydrates and sodium    Personal Goal #2  Pt able to name foods that affect blood glucose        Intervention Plan          Intervention  Prescribe, educate and counsel regarding individualized specific dietary modifications aiming towards  targeted core components such as weight, hypertension, lipid management, diabetes, heart failure and other comorbidities.    Expected Outcomes  Short Term Goal: Understand basic principles of dietary content, such as calories, fat, sodium, cholesterol and nutrients.;Long Term Goal: Adherence to prescribed nutrition plan.           Nutrition Assessments: Nutrition Assessments - 11/30/17 1439    MEDFICTS Scores          Pre Score  15           Nutrition Goals Re-Evaluation:   Nutrition Goals Re-Evaluation:   Nutrition Goals Discharge (Final Nutrition Goals Re-Evaluation):   Psychosocial: Target Goals: Acknowledge presence or absence of significant depression and/or stress, maximize coping skills, provide positive support system. Participant is able to verbalize types and ability to use techniques and skills needed for reducing stress and depression.  Initial Review & Psychosocial Screening: Initial Psych Review & Screening - 11/30/17 1502    Initial Review          Current issues with  None Identified        Family Dynamics          Good Support System?  Yes   spouse, family, friends        Barriers          Psychosocial barriers to participate in program  There are no identifiable barriers or psychosocial needs.        Screening Interventions          Interventions  Encouraged to exercise           Quality of Life Scores: Quality of Life - 11/30/17 1503    Quality of Life          Select  Quality of Life        Quality of Life Scores          Health/Function Pre  14.73 %    Socioeconomic Pre  23.19 %    Psych/Spiritual Pre  19.5 %  Family Pre  24 %    GLOBAL Pre  18.94 %          Scores of 19 and below usually indicate a poorer quality of life in these areas.  A difference of  2-3 points is a clinically meaningful difference.  A difference of 2-3 points in the total score of the Quality of Life Index has been associated with significant  improvement in overall quality of life, self-image, physical symptoms, and general health in studies assessing change in quality of life.  PHQ-9: Recent Review Flowsheet Data    Depression screen [facility] 2/9 01/19/2018 02/27/2016 02/07/2014   Decreased Interest 0 0 0   Down, Depressed, Hopeless 0 0 0   PHQ - 2 Score 0 0 0     Interpretation of Total Score  Total Score Depression Severity:  1-4 = Minimal depression, 5-9 = Mild depression, 10-14 = Moderate depression, 15-19 = Moderately severe depression, 20-27 = Severe depression   Psychosocial Evaluation and Intervention: Psychosocial Evaluation - 12/06/17 1445    Psychosocial Evaluation & Interventions          Interventions  Encouraged to exercise with the program and follow exercise prescription    Comments  no psychosocial needs identified, no interventions necessary . pt enjoys reading and researching things.     Expected Outcomes  pt will exhibit positive outlook with good coping skills.     Continue Psychosocial Services   No Follow up required           Psychosocial Re-Evaluation: Psychosocial Re-Evaluation    Psychosocial Re-Evaluation    Row Name 12/23/17 0734 01/20/18 1144   Current issues with  None Identified  None Identified   Comments  no psychosocial needs identified, no interventions necessary   no psychosocial needs identified, no interventions necessary   Expected Outcomes  pt will exhibit positive outlook with good coping skills.   pt will exhibit positive outlook with good coping skills.    Interventions  Encouraged to attend Cardiac Rehabilitation for the exercise  Encouraged to attend Cardiac Rehabilitation for the exercise   Continue Psychosocial Services   No Follow up required  No Follow up required          Psychosocial Discharge (Final Psychosocial Re-Evaluation): Psychosocial Re-Evaluation - 01/20/18 1144    Psychosocial Re-Evaluation          Current issues with  None Identified    Comments  no  psychosocial needs identified, no interventions necessary    Expected Outcomes  pt will exhibit positive outlook with good coping skills.     Interventions  Encouraged to attend Cardiac Rehabilitation for the exercise    Continue Psychosocial Services   No Follow up required           Vocational Rehabilitation: Provide vocational rehab assistance to qualifying candidates.   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 Participant will state understanding/return demonstration of topics presented.  Learning Barriers/Preferences:   Education Topics: Count Your Pulse:  -Group instruction provided by verbal instruction, demonstration, patient participation and written materials to support subject.  Instructors address importance of being able to find your pulse and  how to count your pulse when at home without a heart monitor.  Patients get hands on experience counting their pulse with staff help and individually.   Heart Attack, Angina, and Risk Factor Modification:  -Group instruction provided by verbal instruction, video, and written materials to support subject.  Instructors address signs and symptoms of angina and heart attacks.    Also discuss risk factors for heart disease and how to make changes to improve heart health risk factors.   Functional Fitness:  -Group instruction provided by verbal instruction, demonstration, patient participation, and written materials to support subject.  Instructors address safety measures for doing things around the house.  Discuss how to get up and down off the floor, how to pick things up properly, how to safely get out of a chair without assistance, and  balance training.   Meditation and Mindfulness:  -Group instruction provided by verbal instruction, patient participation, and written materials to support subject.  Instructor addresses importance of mindfulness and meditation practice to help reduce stress and improve awareness.  Instructor also leads participants through a meditation exercise.    Stretching for Flexibility and Mobility:  -Group instruction provided by verbal instruction, patient participation, and written materials to support subject.  Instructors lead participants through series of stretches that are designed to increase flexibility thus improving mobility.  These stretches are additional exercise for major muscle groups that are typically performed during regular warm up and cool down.   Hands Only CPR:  -Group verbal, video, and participation provides a basic overview of AHA guidelines for community CPR. Role-play of emergencies allow participants the opportunity to practice calling for help and chest compression technique with discussion of AED use.   Hypertension: -Group verbal and written instruction that provides a basic overview of hypertension including the most recent diagnostic guidelines, risk factor reduction with self-care instructions and medication management.    Nutrition I class: Heart Healthy Eating:  -Group instruction provided by PowerPoint slides, verbal discussion, and written materials to support subject matter. The instructor gives an explanation and review of the Therapeutic Lifestyle Changes diet recommendations, which includes a discussion on lipid goals, dietary fat, sodium, fiber, plant stanol/sterol esters, sugar, and the components of a well-balanced, healthy diet.   Nutrition II class: Lifestyle Skills:  -Group instruction provided by PowerPoint slides, verbal discussion, and written materials to support subject matter. The instructor gives an explanation and review of label reading,  grocery shopping for heart health, heart healthy recipe modifications, and ways to make healthier choices when eating out.   Diabetes Question & Answer:  -Group instruction provided by PowerPoint slides, verbal discussion, and written materials to support subject matter. The instructor gives an explanation and review of diabetes co-morbidities, pre- and post-prandial blood glucose goals, pre-exercise blood glucose goals, signs, symptoms, and treatment of hypoglycemia and hyperglycemia, and foot care basics.   Diabetes Blitz:  -Group instruction provided by PowerPoint slides, verbal discussion, and written materials to support subject matter. The instructor gives an explanation and review of the physiology behind type 1 and type 2 diabetes, diabetes medications and rational behind using different medications, pre- and post-prandial blood glucose recommendations and Hemoglobin A1c goals, diabetes diet, and exercise including blood glucose guidelines for exercising safely.    Portion Distortion:  -Group instruction provided by PowerPoint slides, verbal discussion, written materials, and food models to support subject matter. The instructor gives an explanation of serving size versus portion size, changes in portions sizes over the last 20 years, and what  consists of a serving from each food group.   Stress Management:  -Group instruction provided by verbal instruction, video, and written materials to support subject matter.  Instructors review role of stress in heart disease and how to cope with stress positively.     Exercising on Your Own:  -Group instruction provided by verbal instruction, power point, and written materials to support subject.  Instructors discuss benefits of exercise, components of exercise, frequency and intensity of exercise, and end points for exercise.  Also discuss use of nitroglycerin and activating EMS.  Review options of places to exercise outside of rehab.  Review  guidelines for sex with heart disease.   Cardiac Drugs I:  -Group instruction provided by verbal instruction and written materials to support subject.  Instructor reviews cardiac drug classes: antiplatelets, anticoagulants, beta blockers, and statins.  Instructor discusses reasons, side effects, and lifestyle considerations for each drug class.   Cardiac Drugs II:  -Group instruction provided by verbal instruction and written materials to support subject.  Instructor reviews cardiac drug classes: angiotensin converting enzyme inhibitors (ACE-I), angiotensin II receptor blockers (ARBs), nitrates, and calcium channel blockers.  Instructor discusses reasons, side effects, and lifestyle considerations for each drug class.   Anatomy and Physiology of the Circulatory System:  Group verbal and written instruction and models provide basic cardiac anatomy and physiology, with the coronary electrical and arterial systems. Review of: AMI, Angina, Valve disease, Heart Failure, Peripheral Artery Disease, Cardiac Arrhythmia, Pacemakers, and the ICD.   Other Education:  -Group or individual verbal, written, or video instructions that support the educational goals of the cardiac rehab program.   Holiday Eating Survival Tips:  -Group instruction provided by PowerPoint slides, verbal discussion, and written materials to support subject matter. The instructor gives patients tips, tricks, and techniques to help them not only survive but enjoy the holidays despite the onslaught of food that accompanies the holidays.   Knowledge Questionnaire Score: Knowledge Questionnaire Score - 11/30/17 1503    Knowledge Questionnaire Score          Pre Score  22/24           Core Components/Risk Factors/Patient Goals at Admission: Personal Goals and Risk Factors at Admission - 11/30/17 1522    Core Components/Risk Factors/Patient Goals on Admission          Improve shortness of breath with ADL's  Yes     Intervention  Provide education, individualized exercise plan and daily activity instruction to help decrease symptoms of SOB with activities of daily living.    Expected Outcomes  Short Term: Improve cardiorespiratory fitness to achieve a reduction of symptoms when performing ADLs;Long Term: Be able to perform more ADLs without symptoms or delay the onset of symptoms    Intervention  Provide education on lifestyle modifcations including regular physical activity/exercise, weight management, moderate sodium restriction and increased consumption of fresh fruit, vegetables, and low fat dairy, alcohol moderation, and smoking cessation.;Monitor prescription use compliance.    Expected Outcomes  Short Term: Continued assessment and intervention until BP is < 140/41m HG in hypertensive participants. < 130/855mHG in hypertensive participants with diabetes, heart failure or chronic kidney disease.;Long Term: Maintenance of blood pressure at goal levels.    Lipids  Yes    Intervention  Provide education and support for participant on nutrition & aerobic/resistive exercise along with prescribed medications to achieve LDL <7053mHDL >41m41m  Expected Outcomes  Short Term: Participant states understanding of desired cholesterol values and is  compliant with medications prescribed. Participant is following exercise prescription and nutrition guidelines.;Long Term: Cholesterol controlled with medications as prescribed, with individualized exercise RX and with personalized nutrition plan. Value goals: LDL < 49m, HDL > 40 mg.    Stress  Yes    Intervention  Offer individual and/or small group education and counseling on adjustment to heart disease, stress management and health-related lifestyle change. Teach and support self-help strategies.;Refer participants experiencing significant psychosocial distress to appropriate mental health specialists for further evaluation and treatment. When possible, include family members  and significant others in education/counseling sessions.    Expected Outcomes  Short Term: Participant demonstrates changes in health-related behavior, relaxation and other stress management skills, ability to obtain effective social support, and compliance with psychotropic medications if prescribed.;Long Term: Emotional wellbeing is indicated by absence of clinically significant psychosocial distress or social isolation.    Personal Goal Other  Yes    Personal Goal  To improve endurance and breathing and improve cardiac function    Intervention  Will monitor progress in porgram    Expected Outcomes  patient will have an improvement in endurance upon completion of cardiac rehab           Core Components/Risk Factors/Patient Goals Review:  Goals and Risk Factor Review    Core Components/Risk Factors/Patient Goals Review    Row Name 12/06/17 1523 12/23/17 0735 01/20/18 1144   Personal Goals Review  Improve shortness of breath with ADL's;Hypertension;Lipids;Stress;Other  Improve shortness of breath with ADL's;Hypertension;Lipids;Stress;Other  Improve shortness of breath with ADL's;Hypertension;Lipids;Stress;Other   Review  pt with multiple CAD RF demonstrates eagerness to participate in CR program.pt personal goal to improve cardiac risk stratification, improve circulation and overall heart health.   pt with multiple CAD RF demonstrates eagerness to participate in CR program.pt personal goal to improve cardiac risk stratification, improve circulation and overall heart health. pt exhibits improvement with functional ability by increased workloads at CR and navigating stairs at home with less difficulty.     pt with multiple CAD RF demonstrates eagerness to participate in CR program.pt personal goal to improve cardiac risk stratification, improve circulation and overall heart health. pt reports improved walking ability at home, increased strength/stamina and decreased DOE/      Expected Outcomes  pt  will participate in CR exercise, nutrition and lifestyle modification to decrease overall RF.   pt will participate in CR exercise, nutrition and lifestyle modification to decrease overall RF.   pt will participate in CR exercise, nutrition and lifestyle modification to decrease overall RF.           Core Components/Risk Factors/Patient Goals at Discharge (Final Review):  Goals and Risk Factor Review - 01/20/18 1144    Core Components/Risk Factors/Patient Goals Review          Personal Goals Review  Improve shortness of breath with ADL's;Hypertension;Lipids;Stress;Other    Review  pt with multiple CAD RF demonstrates eagerness to participate in CR program.pt personal goal to improve cardiac risk stratification, improve circulation and overall heart health. pt reports improved walking ability at home, increased strength/stamina and decreased DOE/       Expected Outcomes  pt will participate in CR exercise, nutrition and lifestyle modification to decrease overall RF.            ITP Comments: ITP Comments    Row Name 11/29/17 1656 12/06/17 1442 12/23/17 0733   ITP Comments  Dr. TFransico Him Medical Director   pt started group exercise. pt tolerated light activity  without difficulty. pt oriented to exercise equipment and safety routine.   30 day ITP review. pt with good attendance and participation.       Comments:

## 2018-01-21 ENCOUNTER — Encounter (HOSPITAL_COMMUNITY): Payer: Medicare Other

## 2018-01-24 ENCOUNTER — Encounter (HOSPITAL_COMMUNITY): Payer: Medicare Other

## 2018-01-24 ENCOUNTER — Other Ambulatory Visit: Payer: Medicare Other

## 2018-01-24 ENCOUNTER — Encounter (HOSPITAL_COMMUNITY)
Admission: RE | Admit: 2018-01-24 | Discharge: 2018-01-24 | Disposition: A | Discharge status: Home / Self Care | Payer: Medicare Other | Source: Ambulatory Visit | Attending: Cardiology | Admitting: Cardiology

## 2018-01-24 DIAGNOSIS — I5020 Unspecified systolic (congestive) heart failure: Secondary | ICD-10-CM | POA: Diagnosis present

## 2018-01-24 DIAGNOSIS — I5022 Chronic systolic (congestive) heart failure: Secondary | ICD-10-CM

## 2018-01-24 DIAGNOSIS — R197 Diarrhea, unspecified: Secondary | ICD-10-CM

## 2018-01-24 LAB — C.DIFFICILE TOXIN: C. Difficile Toxin A: NOT DETECTED

## 2018-01-24 MED FILL — EPLERENONE 25 MG TABLET: 25 | 30 days supply | Qty: 30 | Fill #3

## 2018-01-25 DIAGNOSIS — H579 Unspecified disorder of eye and adnexa: Secondary | ICD-10-CM | POA: Diagnosis not present

## 2018-01-25 DIAGNOSIS — H2513 Age-related nuclear cataract, bilateral: Secondary | ICD-10-CM | POA: Diagnosis not present

## 2018-01-25 DIAGNOSIS — H353130 Nonexudative age-related macular degeneration, bilateral, stage unspecified: Secondary | ICD-10-CM | POA: Diagnosis not present

## 2018-01-25 DIAGNOSIS — H16141 Punctate keratitis, right eye: Secondary | ICD-10-CM | POA: Diagnosis not present

## 2018-01-25 DIAGNOSIS — H35363 Drusen (degenerative) of macula, bilateral: Secondary | ICD-10-CM | POA: Diagnosis not present

## 2018-01-25 DIAGNOSIS — H43813 Vitreous degeneration, bilateral: Secondary | ICD-10-CM | POA: Diagnosis not present

## 2018-01-26 ENCOUNTER — Encounter (HOSPITAL_COMMUNITY): Payer: Medicare Other

## 2018-01-26 ENCOUNTER — Encounter (HOSPITAL_COMMUNITY)
Admission: RE | Admit: 2018-01-26 | Discharge: 2018-01-26 | Disposition: A | Discharge status: Home / Self Care | Payer: Medicare Other | Source: Ambulatory Visit | Attending: Cardiology | Admitting: Cardiology

## 2018-01-26 DIAGNOSIS — I5022 Chronic systolic (congestive) heart failure: Secondary | ICD-10-CM

## 2018-01-28 ENCOUNTER — Encounter (HOSPITAL_COMMUNITY): Payer: Medicare Other

## 2018-01-28 LAB — STOOL CULTURE
MICRO NUMBER: 91324157
MICRO NUMBER:: 91324155
MICRO NUMBER:: 91324156
SHIGA RESULT:: NOT DETECTED
SPECIMEN QUALITY: ADEQUATE
SPECIMEN QUALITY:: ADEQUATE
SPECIMEN QUALITY:: ADEQUATE

## 2018-01-31 ENCOUNTER — Encounter (HOSPITAL_COMMUNITY): Payer: Medicare Other

## 2018-01-31 ENCOUNTER — Encounter (HOSPITAL_COMMUNITY)
Admission: RE | Admit: 2018-01-31 | Discharge: 2018-01-31 | Disposition: A | Discharge status: Home / Self Care | Payer: Medicare Other | Source: Ambulatory Visit | Attending: Cardiology | Admitting: Cardiology

## 2018-01-31 DIAGNOSIS — I5022 Chronic systolic (congestive) heart failure: Secondary | ICD-10-CM

## 2018-02-02 ENCOUNTER — Encounter (HOSPITAL_COMMUNITY): Payer: Medicare Other

## 2018-02-02 ENCOUNTER — Encounter (HOSPITAL_COMMUNITY)
Admission: RE | Admit: 2018-02-02 | Discharge: 2018-02-02 | Disposition: A | Discharge status: Home / Self Care | Payer: Medicare Other | Source: Ambulatory Visit | Attending: Cardiology | Admitting: Cardiology

## 2018-02-02 DIAGNOSIS — I5022 Chronic systolic (congestive) heart failure: Secondary | ICD-10-CM

## 2018-02-04 ENCOUNTER — Encounter (HOSPITAL_COMMUNITY): Payer: Medicare Other

## 2018-02-07 ENCOUNTER — Encounter (HOSPITAL_COMMUNITY)
Admission: RE | Admit: 2018-02-07 | Discharge: 2018-02-07 | Disposition: A | Discharge status: Home / Self Care | Payer: Medicare Other | Source: Ambulatory Visit | Attending: Cardiology | Admitting: Cardiology

## 2018-02-07 ENCOUNTER — Encounter (HOSPITAL_COMMUNITY): Payer: Medicare Other

## 2018-02-07 DIAGNOSIS — I5022 Chronic systolic (congestive) heart failure: Secondary | ICD-10-CM

## 2018-02-09 ENCOUNTER — Encounter (HOSPITAL_COMMUNITY): Payer: Medicare Other

## 2018-02-09 ENCOUNTER — Encounter (HOSPITAL_COMMUNITY)
Admission: RE | Admit: 2018-02-09 | Discharge: 2018-02-09 | Disposition: A | Discharge status: Home / Self Care | Payer: Medicare Other | Source: Ambulatory Visit | Attending: Cardiology | Admitting: Cardiology

## 2018-02-09 DIAGNOSIS — I5022 Chronic systolic (congestive) heart failure: Secondary | ICD-10-CM | POA: Diagnosis not present

## 2018-02-10 ENCOUNTER — Encounter (HOSPITAL_COMMUNITY): Payer: Self-pay

## 2018-02-10 NOTE — Progress Notes (Signed)
Cardiac Individual Treatment Plan  Patient Details  Name: Caleb RAIMONDI, PhD MRN: [id number] Date of Birth: [birth year] Referring Provider:   Flowsheet Row CARDIAC REHAB PHASE II ORIENTATION from 11/30/2017 in [facility]  Referring Provider  Loralie Champagne, MD.      Initial Encounter Date:  Flowsheet Row CARDIAC REHAB PHASE II ORIENTATION from 11/30/2017 in [facility]  Date  11/30/17      Visit Diagnosis: 77/4128 Chronic systolic congestive heart failure ([place])  Patient's Home Medications on Admission:  Current Outpatient Medications:  .  aspirin EC 81 MG tablet, Take 1 tablet (81 mg total) by mouth daily., Disp: 30 tablet, Rfl: 3 .  eplerenone (INSPRA) 25 MG tablet, Take 1 tablet (25 mg total) by mouth daily., Disp: 30 tablet, Rfl: 11 .  macitentan (OPSUMIT) 10 MG tablet, Take 10 mg by mouth daily., Disp: , Rfl:  .  magnesium gluconate (MAGONATE) 500 MG tablet, Take 500 mg by mouth 2 (two) times daily. , Disp: , Rfl:  .  Omega-3 Fatty Acids (FISH OIL PO), Take 360 mg by mouth daily. , Disp: , Rfl:  .  potassium chloride (K-DUR,KLOR-CON) 10 MEQ tablet, Take 2 tablets (20 mEq total) by mouth daily., Disp: 60 tablet, Rfl: 3 .  torsemide (DEMADEX) 10 MG tablet, Take 4 tablets (40 mg total) by mouth daily. May also take 1 tablet (10 mg total) as needed (sob/edema)., Disp: 150 tablet, Rfl: 1 .  valsartan (DIOVAN) 40 MG tablet, TAKE 1 AND 1/2 TABLETS BY MOUTH IN THE MORNING AND 2 TABLETS IN THE EVENING (Patient taking differently: Take 40 mg by mouth 2 (two) times daily. ), Disp: 105 tablet, Rfl: 3  Past Medical History: Past Medical History:  Diagnosis Date  . Allergic rhinitis   . Atrial fibrillation ([place])   . Atypical pneumonia   . CAD (coronary artery disease)   . Cardiomyopathy, ischemic   . Chronic anticoagulation   . Cough   . Dizziness   . GERD (gastroesophageal reflux disease)   . Heart failure, systolic, acute  on chronic ([place])   . Hyperlipidemia   . Hypertension   . OSA (obstructive sleep apnea)    Home sleep test 07/05/2009 AHI 8.2  . Pleural effusion   . Positional vertigo   . TIA (transient ischemic attack)     Tobacco Use: Social History   Tobacco Use  Smoking Status Never Smoker  Smokeless Tobacco Never Used    Labs: Recent Review Flowsheet Data    Labs for ITP Cardiac and Pulmonary Rehab Latest Ref Rng & Units 01/10/2016 01/10/2016 08/17/2017 10/07/2017 11/16/2017   Cholestrol 0 - 200 mg/dL - - 212(H) 206(H) -   LDLCALC 0 - 99 mg/dL - - 140(H) 136(H) -   LDLDIRECT mg/dL - - - - -   HDL >40 mg/dL - - 55 53 -   Trlycerides <150 mg/dL - - 86 84 -   Hemoglobin A1c 4.8 - 5.6 % - - 5.5 - -   PHART 7.350 - 7.450 - - - - -   PCO2ART 35.0 - 45.0 mmHg - - - - -   HCO3 20.0 - 28.0 mmol/L 26.7 26.1 - - 24.8   TCO2 22 - 32 mmol/L 28 27 - - 26   ACIDBASEDEF 0.0 - 2.0 mmol/L - - - - -   O2SAT % 68.0 67.0 - - 69.0      Capillary Blood Glucose: No results found for: GLUCAP  Exercise Target Goals: Exercise Program Goal: Individual exercise prescription set using results from initial 6 min walk test and THRR while considering  patient's activity barriers and safety.   Exercise Prescription Goal: Initial exercise prescription builds to 30-45 minutes a day of aerobic activity, 2-3 days per week.  Home exercise guidelines will be given to patient during program as part of exercise prescription that the participant will acknowledge.  Activity Barriers & Risk Stratification: Activity Barriers & Cardiac Risk Stratification - 11/30/17 1405    Activity Barriers & Cardiac Risk Stratification          Activity Barriers  Shortness of Breath;Other (comment)    Comments  Lightheadedness. OA- hips.    Cardiac Risk Stratification  High           6 Minute Walk: 6 Minute Walk    6 Minute Walk    Row Name 11/30/17 1350   Phase  Initial   Distance  1838 feet   Walk Time  6 minutes   # of  Rest Breaks  0   MPH  3.48   METS  4.13   RPE  15   VO2 Peak  14.44   Symptoms  No   Resting HR  68 bpm   Resting BP  124/70   Resting Oxygen Saturation   97 %   Exercise Oxygen Saturation  during 6 min walk  98 %   Max Ex. HR  118 bpm   Max Ex. BP  164/78   2 Minute Post BP  124/72          Oxygen Initial Assessment:   Oxygen Re-Evaluation:   Oxygen Discharge (Final Oxygen Re-Evaluation):   Initial Exercise Prescription: Initial Exercise Prescription - 11/30/17 1500    Date of Initial Exercise RX and Referring Provider          Date  11/30/17    Referring Provider  Loralie Champagne, MD.        Bike          Level  1    Minutes  10    METs  3.7        NuStep          Level  3    SPM  85    Minutes  10    METs  3.4        Track          Laps  15    Minutes  10    METs  3.41        Prescription Details          Frequency (times per week)  3    Duration  Progress to 30 minutes of continuous aerobic without signs/symptoms of physical distress        Intensity          THRR 40-80% of Max Heartrate  56-113    Ratings of Perceived Exertion  11-13    Perceived Dyspnea  0-4        Progression          Progression  Continue to progress workloads to maintain intensity without signs/symptoms of physical distress.        Resistance Training          Training Prescription  Yes    Weight  3lbs    Reps  10-15           Perform Capillary Blood Glucose checks as needed.  Exercise Prescription  Changes: Exercise Prescription Changes    Response to Exercise    Row Name 12/06/17 1332 12/20/17 1328 01/03/18 1143 01/19/18 1315 02/08/18 1544   Blood Pressure (Admit)  120/60  138/70  138/68  142/60  122/64   Blood Pressure (Exercise)  164/74  142/62  142/62  144/82  138/72   Blood Pressure (Exit)  126/70  130/64  138/72  130/74  122/70   Heart Rate (Admit)  81 bpm  85 bpm  79 bpm  79 bpm  78 bpm   Heart Rate (Exercise)  104 bpm  111 bpm  107 bpm  110  bpm  105 bpm   Heart Rate (Exit)  81 bpm  84 bpm  73 bpm  79 bpm  71 bpm   Rating of Perceived Exertion (Exercise)  _0 Symptoms  none  none  none  none  none   Comments  Decreased bike workloads, too difficult.  no documentation  no documentation  no documentation  no documentation   Duration  Progress to 30 minutes of  aerobic without signs/symptoms of physical distress  Progress to 30 minutes of  aerobic without signs/symptoms of physical distress  Progress to 30 minutes of  aerobic without signs/symptoms of physical distress  Progress to 30 minutes of  aerobic without signs/symptoms of physical distress  Progress to 30 minutes of  aerobic without signs/symptoms of physical distress   Intensity  THRR unchanged  THRR unchanged  THRR unchanged  THRR unchanged  THRR unchanged       Progression    Row Name 12/06/17 1332 12/20/17 1328 01/03/18 1143 01/19/18 1315 02/08/18 1544   Progression  Continue to progress workloads to maintain intensity without signs/symptoms of physical distress.  Continue to progress workloads to maintain intensity without signs/symptoms of physical distress.  Continue to progress workloads to maintain intensity without signs/symptoms of physical distress.  Continue to progress workloads to maintain intensity without signs/symptoms of physical distress.  Continue to progress workloads to maintain intensity without signs/symptoms of physical distress.   Average METs  2.9  3.1  3.21  3.67  3.56       Resistance Training    Row Name 12/06/17 1332 12/20/17 1328 01/03/18 1143 01/19/18 1315 02/08/18 1544   Training Prescription  No  Yes  Yes  No  Yes   Weight  no documentation  4lbs  3lbs  no documentation  4 lbs.    Reps  no documentation  10-15  10-15  no documentation  10-15   Time  no documentation  10 Minutes  10 Minutes  no documentation  10 Minutes       Interval Training    Row Name 12/06/17 1332 12/20/17 1328 01/03/18 1143 01/19/18 1315 02/08/18 1544    Interval Training  No  No  No  No  No       Bike    Row Name 12/06/17 1332 12/20/17 1328 01/03/18 1143 01/19/18 1315 02/08/18 1544   Level  0.5 Decrease workload, too hard.  0._1 Minutes  _2 METs  2.34  2.33  3.67  3.67  3.7       NuStep    Row Name 12/06/17 1332 12/20/17 1328 01/03/18 1143 01/19/18 1315 02/08/18 1544   Level  _3 SPM  85  85  85  85  85   Minutes  _0 METs  2.6  3.3  3.1  3.2  3.2       Arm Ergometer    Row Name 12/06/17 1332 12/20/17 1328 01/03/18 1143 01/19/18 1315 02/08/18 1544   Level  no documentation  no documentation  1  no documentation  no documentation   Watts  no documentation  no documentation  25  no documentation  no documentation   Minutes  no documentation  no documentation  10  no documentation  no documentation   METs  no documentation  no documentation  2.87  no documentation  no documentation       Track    Row Name 12/06/17 1332 12/20/17 1328 01/03/18 1143 01/19/18 1315 02/08/18 1544   Laps  16  16  no documentation  18  16   Minutes  10  10  no documentation  10  10   METs  3.79  3.79  no documentation  4.14  3.78          Exercise Comments: Exercise Comments    Row Name 12/06/17 1420 12/20/17 1353 01/05/18 1623 01/20/18 0947 02/09/18 1436   Exercise Comments  Patient tolerated exercise fairly well, decreased workload on aridyne due to dificulty.  Reviewed METs and goals with patient.  Reviewed METs and goals with patient.   Pt is increasing MET level each session. Will continue to exercise at home.   Reviewed METs and goals with Pt. Pt will continue exercising at home in addition to cardiac rehab.       Exercise Goals and Review: Exercise Goals    Exercise Goals    Row Name 11/30/17 1524   Increase Physical Activity  Yes   Intervention  Provide advice, education, support and counseling about physical activity/exercise needs.;Develop an individualized exercise  prescription for aerobic and resistive training based on initial evaluation findings, risk stratification, comorbidities and participant's personal goals.   Expected Outcomes  Short Term: Attend rehab on a regular basis to increase amount of physical activity.;Long Term: Exercising regularly at least 3-5 days a week.;Long Term: Add in home exercise to make exercise part of routine and to increase amount of physical activity.   Increase Strength and Stamina  Yes   Intervention  Provide advice, education, support and counseling about physical activity/exercise needs.;Develop an individualized exercise prescription for aerobic and resistive training based on initial evaluation findings, risk stratification, comorbidities and participant's personal goals.   Expected Outcomes  Short Term: Increase workloads from initial exercise prescription for resistance, speed, and METs.;Short Term: Perform resistance training exercises routinely during rehab and add in resistance training at home;Long Term: Improve cardiorespiratory fitness, muscular endurance and strength as measured by increased METs and functional capacity (6MWT)   Able to understand and use rate of perceived exertion (RPE) scale  Yes   Intervention  Provide education and explanation on how to use RPE scale   Expected Outcomes  Short Term: Able to use RPE daily in rehab to express subjective intensity level;Long Term:  Able to use RPE to guide intensity level when exercising independently   Knowledge and understanding of Target Heart Rate Range (THRR)  Yes   Intervention  Provide education and explanation of THRR including how the numbers were predicted and where they are located for reference   Expected Outcomes  Long Term: Able to use THRR to govern intensity  when exercising independently;Short Term: Able to state/look up THRR;Short Term: Able to use daily as guideline for intensity in rehab   Able to check pulse independently  Yes   Intervention   Provide education and demonstration on how to check pulse in carotid and radial arteries.;Review the importance of being able to check your own pulse for safety during independent exercise   Expected Outcomes  Short Term: Able to explain why pulse checking is important during independent exercise;Long Term: Able to check pulse independently and accurately   Understanding of Exercise Prescription  Yes   Intervention  Provide education, explanation, and written materials on patient's individual exercise prescription   Expected Outcomes  Short Term: Able to explain program exercise prescription;Long Term: Able to explain home exercise prescription to exercise independently          Exercise Goals Re-Evaluation : Exercise Goals Re-Evaluation    Exercise Goal Re-Evaluation    Row Name 12/06/17 1420 12/20/17 1353 01/05/18 1621 01/20/18 0946 02/09/18 1434   Exercise Goals Review  Able to understand and use rate of perceived exertion (RPE) scale  Able to understand and use rate of perceived exertion (RPE) scale;Increase Physical Activity  Increase Physical Activity;Increase Strength and Stamina;Knowledge and understanding of Target Heart Rate Range (THRR);Able to understand and use rate of perceived exertion (RPE) scale;Understanding of Exercise Prescription;Able to check pulse independently  Increase Physical Activity;Increase Strength and Stamina;Knowledge and understanding of Target Heart Rate Range (THRR);Able to understand and use rate of perceived exertion (RPE) scale;Understanding of Exercise Prescription;Able to check pulse independently  Increase Physical Activity;Increase Strength and Stamina;Able to understand and use rate of perceived exertion (RPE) scale;Knowledge and understanding of Target Heart Rate Range (THRR);Understanding of Exercise Prescription;Able to check pulse independently   Comments  Patient able to understand and use RPE scale appropriately.  Patient is walking 30 minutes, 2-3  days/week in addition to exercise at cardiac rehab.  Patient will continue to walk 3-4 days per week for 30 minutes in addition to CR exercise.   Pt is increasing MET level and has a MET level of 3.67. Tolerates exercise well. Exercises at home in addition to Cardiac Rehab.   Reviewed METs and goals with Pt. MET level is 3.56. Pt will continue to walk at home for 30 minutes 2-3 days per week in addition to cardiac rehab.    Expected Outcomes  Increase workloads as tolerated to help improve endurance and cardiac function.  Patient will continue current exercise routine: 30 minutes, 5-6 days/week to help achieve personal health and fitness goals.  Will continue to monitor and progress Pt as tolerated.   Will continue to monitor and progress Pt as tolerated.   Will continue to monitor and progress Pt as tolerated.           Discharge Exercise Prescription (Final Exercise Prescription Changes): Exercise Prescription Changes - 02/08/18 1544    Response to Exercise          Blood Pressure (Admit)  122/64    Blood Pressure (Exercise)  138/72    Blood Pressure (Exit)  122/70    Heart Rate (Admit)  78 bpm    Heart Rate (Exercise)  105 bpm    Heart Rate (Exit)  71 bpm    Rating of Perceived Exertion (Exercise)  12    Symptoms  none    Duration  Progress to 30 minutes of  aerobic without signs/symptoms of physical distress    Intensity  THRR unchanged  Progression          Progression  Continue to progress workloads to maintain intensity without signs/symptoms of physical distress.    Average METs  3.56        Resistance Training          Training Prescription  Yes    Weight  4 lbs.     Reps  10-15    Time  10 Minutes        Interval Training          Interval Training  No        Bike          Level  1    Minutes  10    METs  3.7        NuStep          Level  5    SPM  85    Minutes  10    METs  3.2        Track          Laps  16    Minutes  10    METs  3.78            Nutrition:  Target Goals: Understanding of nutrition guidelines, daily intake of sodium <1515m, cholesterol <2046m calories 30% from fat and 7% or less from saturated fats, daily to have 5 or more servings of fruits and vegetables.  Biometrics: Pre Biometrics - 11/30/17 1340    Pre Biometrics          Height  _0  (1.753 m)    Weight  70.4 kg    Waist Circumference  34 inches    Hip Circumference  38 inches    Waist to Hip Ratio  0.89 %    BMI (Calculated)  22.91    Triceps Skinfold  12 mm    % Body Fat  22.4 %    Grip Strength  36 kg    Flexibility  0 in    Single Leg Stand  3.31 seconds            Nutrition Therapy Plan and Nutrition Goals: Nutrition Therapy & Goals - 11/30/17 1438    Nutrition Therapy          Diet  heart healthy, consistent carbohydrate        Personal Nutrition Goals          Nutrition Goal  Pt to identify and limit food sources of saturated fat, trans fat, refined carbohydrates and sodium    Personal Goal #2  Pt able to name foods that affect blood glucose        Intervention Plan          Intervention  Prescribe, educate and counsel regarding individualized specific dietary modifications aiming towards targeted core components such as weight, hypertension, lipid management, diabetes, heart failure and other comorbidities.    Expected Outcomes  Short Term Goal: Understand basic principles of dietary content, such as calories, fat, sodium, cholesterol and nutrients.;Long Term Goal: Adherence to prescribed nutrition plan.           Nutrition Assessments: Nutrition Assessments - 11/30/17 1439    MEDFICTS Scores          Pre Score  15           Nutrition Goals Re-Evaluation:   Nutrition Goals Re-Evaluation:   Nutrition Goals Discharge (Final Nutrition Goals Re-Evaluation):   Psychosocial: Target Goals: Acknowledge presence or absence of  significant depression and/or stress, maximize coping skills, provide positive  support system. Participant is able to verbalize types and ability to use techniques and skills needed for reducing stress and depression.  Initial Review & Psychosocial Screening: Initial Psych Review & Screening - 11/30/17 1502    Initial Review          Current issues with  None Identified        Family Dynamics          Good Support System?  Yes   spouse, family, friends        Barriers          Psychosocial barriers to participate in program  There are no identifiable barriers or psychosocial needs.        Screening Interventions          Interventions  Encouraged to exercise           Quality of Life Scores: Quality of Life - 01/26/18 1416    Quality of Life Scores          Health/Function Pre  14.73 %   pt notes minimal but noticeable improvement in DOE and fatigue. pt unsure if exercise or prescribe supplements are cause of improvement. pt encouraged to continue exercise and congratulated on his improved symptoms.    Socioeconomic Pre  23.19 %    Psych/Spiritual Pre  19.5 %    Family Pre  24 %    GLOBAL Pre  18.94 %   overall pt verbalizes good coping skills and resources.  pt encouraged gratitude, exercise and socialization are good tools for improved outlook         Scores of 19 and below usually indicate a poorer quality of life in these areas.  A difference of  2-3 points is a clinically meaningful difference.  A difference of 2-3 points in the total score of the Quality of Life Index has been associated with significant improvement in overall quality of life, self-image, physical symptoms, and general health in studies assessing change in quality of life.  PHQ-9: Recent Review Flowsheet Data    Depression screen [facility] 2/9 01/19/2018 02/27/2016 02/07/2014   Decreased Interest 0 0 0   Down, Depressed, Hopeless 0 0 0   PHQ - 2 Score 0 0 0     Interpretation of Total Score  Total Score Depression Severity:  1-4 = Minimal depression, 5-9 = Mild depression,  10-14 = Moderate depression, 15-19 = Moderately severe depression, 20-27 = Severe depression   Psychosocial Evaluation and Intervention: Psychosocial Evaluation - 12/06/17 1445    Psychosocial Evaluation & Interventions          Interventions  Encouraged to exercise with the program and follow exercise prescription    Comments  no psychosocial needs identified, no interventions necessary . pt enjoys reading and researching things.     Expected Outcomes  pt will exhibit positive outlook with good coping skills.     Continue Psychosocial Services   No Follow up required           Psychosocial Re-Evaluation: Psychosocial Re-Evaluation    Psychosocial Re-Evaluation    Row Name 12/23/17 0734 01/20/18 1144 02/10/18 1203   Current issues with  None Identified  None Identified  None Identified   Comments  no psychosocial needs identified, no interventions necessary   no psychosocial needs identified, no interventions necessary  no psychosocial needs identified, no interventions necessary   Expected Outcomes  pt will exhibit positive outlook with  good coping skills.   pt will exhibit positive outlook with good coping skills.   pt will exhibit positive outlook with good coping skills.    Interventions  Encouraged to attend Cardiac Rehabilitation for the exercise  Encouraged to attend Cardiac Rehabilitation for the exercise  Encouraged to attend Cardiac Rehabilitation for the exercise   Continue Psychosocial Services   No Follow up required  No Follow up required  No Follow up required          Psychosocial Discharge (Final Psychosocial Re-Evaluation): Psychosocial Re-Evaluation - 02/10/18 1203    Psychosocial Re-Evaluation          Current issues with  None Identified    Comments  no psychosocial needs identified, no interventions necessary    Expected Outcomes  pt will exhibit positive outlook with good coping skills.     Interventions  Encouraged to attend Cardiac Rehabilitation for the  exercise    Continue Psychosocial Services   No Follow up required           Vocational Rehabilitation: Provide vocational rehab assistance to qualifying candidates.   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 Participant will state understanding/return demonstration of topics presented.  Learning Barriers/Preferences:   Education Topics: Count Your Pulse:  -Group instruction provided by verbal instruction, demonstration, patient participation and written materials to support subject.  Instructors address importance of being able to find your pulse and how to count your pulse when at home without a heart monitor.  Patients get hands on experience counting their pulse with staff help and individually.   Heart Attack, Angina, and Risk Factor Modification:  -Group instruction provided by verbal instruction, video, and written materials to support subject.  Instructors address signs and symptoms of angina and heart attacks.    Also discuss risk factors for heart disease and how to make changes to improve heart health risk factors.   Functional Fitness:  -Group instruction provided by verbal instruction, demonstration, patient participation, and written materials to support subject.  Instructors address safety measures for doing things around the house.  Discuss how to get up and down off the floor, how to pick things up properly, how to safely get out of a chair without assistance, and balance training.   Meditation and Mindfulness:  -Group instruction provided by verbal instruction, patient participation, and written materials to support subject.  Instructor addresses importance of  mindfulness and meditation practice to help reduce stress and improve awareness.  Instructor also leads participants through a meditation exercise.    Stretching for Flexibility and Mobility:  -Group instruction provided by verbal instruction, patient participation, and written materials to support subject.  Instructors lead participants through series of stretches that are designed to increase flexibility thus improving mobility.  These stretches are additional exercise for major muscle groups that are typically performed during regular warm up and cool down.   Hands Only CPR:  -Group verbal, video, and participation provides a basic overview of AHA guidelines for community CPR. Role-play of emergencies allow participants the opportunity to practice calling for help and chest compression technique with discussion of AED use.   Hypertension: -Group verbal and written  instruction that provides a basic overview of hypertension including the most recent diagnostic guidelines, risk factor reduction with self-care instructions and medication management.    Nutrition I class: Heart Healthy Eating:  -Group instruction provided by PowerPoint slides, verbal discussion, and written materials to support subject matter. The instructor gives an explanation and review of the Therapeutic Lifestyle Changes diet recommendations, which includes a discussion on lipid goals, dietary fat, sodium, fiber, plant stanol/sterol esters, sugar, and the components of a well-balanced, healthy diet.   Nutrition II class: Lifestyle Skills:  -Group instruction provided by PowerPoint slides, verbal discussion, and written materials to support subject matter. The instructor gives an explanation and review of label reading, grocery shopping for heart health, heart healthy recipe modifications, and ways to make healthier choices when eating out.   Diabetes Question & Answer:  -Group instruction provided by PowerPoint slides,  verbal discussion, and written materials to support subject matter. The instructor gives an explanation and review of diabetes co-morbidities, pre- and post-prandial blood glucose goals, pre-exercise blood glucose goals, signs, symptoms, and treatment of hypoglycemia and hyperglycemia, and foot care basics.   Diabetes Blitz:  -Group instruction provided by PowerPoint slides, verbal discussion, and written materials to support subject matter. The instructor gives an explanation and review of the physiology behind type 1 and type 2 diabetes, diabetes medications and rational behind using different medications, pre- and post-prandial blood glucose recommendations and Hemoglobin A1c goals, diabetes diet, and exercise including blood glucose guidelines for exercising safely.    Portion Distortion:  -Group instruction provided by PowerPoint slides, verbal discussion, written materials, and food models to support subject matter. The instructor gives an explanation of serving size versus portion size, changes in portions sizes over the last 20 years, and what consists of a serving from each food group.   Stress Management:  -Group instruction provided by verbal instruction, video, and written materials to support subject matter.  Instructors review role of stress in heart disease and how to cope with stress positively.     Exercising on Your Own:  -Group instruction provided by verbal instruction, power point, and written materials to support subject.  Instructors discuss benefits of exercise, components of exercise, frequency and intensity of exercise, and end points for exercise.  Also discuss use of nitroglycerin and activating EMS.  Review options of places to exercise outside of rehab.  Review guidelines for sex with heart disease.   Cardiac Drugs I:  -Group instruction provided by verbal instruction and written materials to support subject.  Instructor reviews cardiac drug classes: antiplatelets,  anticoagulants, beta blockers, and statins.  Instructor discusses reasons, side effects, and lifestyle considerations for each drug class.   Cardiac Drugs II:  -Group instruction provided by verbal instruction and written materials to support subject.  Instructor reviews cardiac drug classes: angiotensin converting enzyme inhibitors (ACE-I), angiotensin II receptor blockers (ARBs), nitrates, and calcium channel blockers.  Instructor discusses reasons, side effects, and lifestyle considerations for each drug class.   Anatomy and Physiology of the Circulatory System:  Group verbal and written instruction and models provide basic cardiac anatomy and physiology, with the coronary electrical and arterial systems. Review of: AMI, Angina, Valve disease, Heart Failure, Peripheral Artery Disease, Cardiac Arrhythmia, Pacemakers, and the ICD.   Other Education:  -Group or individual verbal, written, or video instructions that support the educational goals of the cardiac rehab program.   Holiday Eating Survival Tips:  -Group instruction provided by PowerPoint slides, verbal discussion, and written materials to support  subject matter. The instructor gives patients tips, tricks, and techniques to help them not only survive but enjoy the holidays despite the onslaught of food that accompanies the holidays.   Knowledge Questionnaire Score: Knowledge Questionnaire Score - 11/30/17 1503    Knowledge Questionnaire Score          Pre Score  22/24           Core Components/Risk Factors/Patient Goals at Admission: Personal Goals and Risk Factors at Admission - 11/30/17 1522    Core Components/Risk Factors/Patient Goals on Admission          Improve shortness of breath with ADL's  Yes    Intervention  Provide education, individualized exercise plan and daily activity instruction to help decrease symptoms of SOB with activities of daily living.    Expected Outcomes  Short Term: Improve cardiorespiratory  fitness to achieve a reduction of symptoms when performing ADLs;Long Term: Be able to perform more ADLs without symptoms or delay the onset of symptoms    Intervention  Provide education on lifestyle modifcations including regular physical activity/exercise, weight management, moderate sodium restriction and increased consumption of fresh fruit, vegetables, and low fat dairy, alcohol moderation, and smoking cessation.;Monitor prescription use compliance.    Expected Outcomes  Short Term: Continued assessment and intervention until BP is < 140/48m HG in hypertensive participants. < 130/863mHG in hypertensive participants with diabetes, heart failure or chronic kidney disease.;Long Term: Maintenance of blood pressure at goal levels.    Lipids  Yes    Intervention  Provide education and support for participant on nutrition & aerobic/resistive exercise along with prescribed medications to achieve LDL <7045mHDL >39m81m  Expected Outcomes  Short Term: Participant states understanding of desired cholesterol values and is compliant with medications prescribed. Participant is following exercise prescription and nutrition guidelines.;Long Term: Cholesterol controlled with medications as prescribed, with individualized exercise RX and with personalized nutrition plan. Value goals: LDL < 70mg51mL > 40 mg.    Stress  Yes    Intervention  Offer individual and/or small group education and counseling on adjustment to heart disease, stress management and health-related lifestyle change. Teach and support self-help strategies.;Refer participants experiencing significant psychosocial distress to appropriate mental health specialists for further evaluation and treatment. When possible, include family members and significant others in education/counseling sessions.    Expected Outcomes  Short Term: Participant demonstrates changes in health-related behavior, relaxation and other stress management skills, ability to obtain  effective social support, and compliance with psychotropic medications if prescribed.;Long Term: Emotional wellbeing is indicated by absence of clinically significant psychosocial distress or social isolation.    Personal Goal Other  Yes    Personal Goal  To improve endurance and breathing and improve cardiac function    Intervention  Will monitor progress in porgram    Expected Outcomes  patient will have an improvement in endurance upon completion of cardiac rehab           Core Components/Risk Factors/Patient Goals Review:  Goals and Risk Factor Review    Core Components/Risk Factors/Patient Goals Review    Row Name 12/06/17 1523 12/23/17 0735 01/20/18 1144 02/10/18 1203   Personal Goals Review  Improve shortness of breath with ADL's;Hypertension;Lipids;Stress;Other  Improve shortness of breath with ADL's;Hypertension;Lipids;Stress;Other  Improve shortness of breath with ADL's;Hypertension;Lipids;Stress;Other  Improve shortness of breath with ADL's;Hypertension;Lipids;Stress;Other   Review  pt with multiple CAD RF demonstrates eagerness to participate in CR program.pt personal goal to improve cardiac risk stratification, improve circulation  and overall heart health.   pt with multiple CAD RF demonstrates eagerness to participate in CR program.pt personal goal to improve cardiac risk stratification, improve circulation and overall heart health. pt exhibits improvement with functional ability by increased workloads at CR and navigating stairs at home with less difficulty.     pt with multiple CAD RF demonstrates eagerness to participate in CR program.pt personal goal to improve cardiac risk stratification, improve circulation and overall heart health. pt reports improved walking ability at home, increased strength/stamina and decreased DOE/     pt with multiple CAD RF demonstrates eagerness to participate in CR program.pt personal goal to improve cardiac risk stratification, improve circulation and  overall heart health. pt reports continued improvement in walking ability at home, increased strength/stamina and decreased DOE.   Expected Outcomes  pt will participate in CR exercise, nutrition and lifestyle modification to decrease overall RF.   pt will participate in CR exercise, nutrition and lifestyle modification to decrease overall RF.   pt will participate in CR exercise, nutrition and lifestyle modification to decrease overall RF.   pt will participate in CR exercise, nutrition and lifestyle modification to decrease overall RF.           Core Components/Risk Factors/Patient Goals at Discharge (Final Review):  Goals and Risk Factor Review - 02/10/18 1203    Core Components/Risk Factors/Patient Goals Review          Personal Goals Review  Improve shortness of breath with ADL's;Hypertension;Lipids;Stress;Other    Review  pt with multiple CAD RF demonstrates eagerness to participate in CR program.pt personal goal to improve cardiac risk stratification, improve circulation and overall heart health. pt reports continued improvement in walking ability at home, increased strength/stamina and decreased DOE.    Expected Outcomes  pt will participate in CR exercise, nutrition and lifestyle modification to decrease overall RF.            ITP Comments: ITP Comments    Row Name 11/29/17 1656 12/06/17 1442 12/23/17 0733 02/10/18 1202   ITP Comments  Dr. Fransico Him, Medical Director   pt started group exercise. pt tolerated light activity without difficulty. pt oriented to exercise equipment and safety routine.   30 day ITP review. pt with good attendance and participation.   30 day ITP review. pt with good attendance and participation.       Comments:

## 2018-02-11 ENCOUNTER — Encounter (HOSPITAL_COMMUNITY): Payer: Medicare Other

## 2018-02-14 ENCOUNTER — Encounter (HOSPITAL_COMMUNITY): Payer: Medicare Other

## 2018-02-14 ENCOUNTER — Encounter (HOSPITAL_COMMUNITY)
Admission: RE | Admit: 2018-02-14 | Discharge: 2018-02-14 | Disposition: A | Discharge status: Home / Self Care | Payer: Medicare Other | Source: Ambulatory Visit | Attending: Cardiology | Admitting: Cardiology

## 2018-02-14 DIAGNOSIS — I5022 Chronic systolic (congestive) heart failure: Secondary | ICD-10-CM | POA: Diagnosis not present

## 2018-02-14 NOTE — Progress Notes (Signed)
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 Caleb [name] is currently exercising at home.  The patient was advised to walk 5-7 days a week for 30-45 minutes.  Caleb [name] and I discussed how to progress their exercise prescription.  The patient stated that their goals were to continue exercising 30-45 minutes a day for 30 minutes.  The patient stated that they understand the exercise prescription.  We reviewed exercise guidelines, target heart rate during exercise, RPE Scale, weather conditions, NTG use, endpoints for exercise, warmup and cool down.  Patient is encouraged to come to me with any questions. I will continue to follow up with the patient to assist them with progression and safety.    02/14/2018 4:05 PM  Deitra Mayo BS, ACSM CEP

## 2018-02-16 ENCOUNTER — Encounter (HOSPITAL_COMMUNITY): Payer: Medicare Other

## 2018-02-16 ENCOUNTER — Ambulatory Visit (HOSPITAL_COMMUNITY): Payer: Self-pay | Admitting: Cardiac Rehabilitation

## 2018-02-16 ENCOUNTER — Encounter (HOSPITAL_COMMUNITY)
Admission: RE | Admit: 2018-02-16 | Discharge: 2018-02-16 | Disposition: A | Discharge status: Home / Self Care | Payer: Medicare Other | Source: Ambulatory Visit | Attending: Cardiology | Admitting: Cardiology

## 2018-02-16 VITALS — Ht 69.0 in | Wt 156.5 lb

## 2018-02-16 DIAGNOSIS — I5022 Chronic systolic (congestive) heart failure: Secondary | ICD-10-CM | POA: Diagnosis not present

## 2018-02-18 ENCOUNTER — Encounter (HOSPITAL_COMMUNITY): Payer: Medicare Other

## 2018-02-21 ENCOUNTER — Ambulatory Visit (INDEPENDENT_AMBULATORY_CARE_PROVIDER_SITE_OTHER): Payer: Medicare Other | Admitting: Family Medicine

## 2018-02-21 ENCOUNTER — Ambulatory Visit: Payer: Self-pay | Admitting: *Deleted

## 2018-02-21 ENCOUNTER — Encounter: Payer: Self-pay | Admitting: Family Medicine

## 2018-02-21 ENCOUNTER — Encounter (HOSPITAL_COMMUNITY): Payer: Medicare Other

## 2018-02-21 ENCOUNTER — Telehealth (HOSPITAL_COMMUNITY): Payer: Self-pay | Admitting: Family Medicine

## 2018-02-21 VITALS — BP 126/62 | HR 94 | Temp 98.8°F | Ht 69.0 in | Wt 158.0 lb

## 2018-02-21 DIAGNOSIS — R6883 Chills (without fever): Secondary | ICD-10-CM | POA: Diagnosis not present

## 2018-02-21 DIAGNOSIS — R52 Pain, unspecified: Secondary | ICD-10-CM

## 2018-02-21 DIAGNOSIS — J40 Bronchitis, not specified as acute or chronic: Secondary | ICD-10-CM

## 2018-02-21 LAB — POC INFLUENZA A&B (BINAX/QUICKVUE)
Influenza A, POC: NEGATIVE
Influenza B, POC: NEGATIVE

## 2018-02-21 MED ORDER — GUAIFENESIN-CODEINE 100-10 MG/5ML PO SOLN: 2.5000 mL | Freq: Four times a day (QID) | ORAL | 0 refills | Status: DC | PRN

## 2018-02-21 NOTE — Patient Instructions (Addendum)
Bad respiratory infection- with severity suspicious for bronchitis though I dont hear wheeze/rhonchi on exam  Lets try codeine since you have tolerated that in the past. Drink with half glass of water if doing half dose.   I want to know if any fever or shortness of breath immediately or if you feel like you are not improving despite improved rest at night.   Happy to either see you back or would be willing to call in augmentin or doxycyline per your preference  Read about pelargonium sidoides for URI/bronchitis for future use. I think the product names are Umcka cold care/umckaloaba

## 2018-02-21 NOTE — Telephone Encounter (Signed)
   Reason for Disposition . [1] Continuous (nonstop) coughing interferes with work or school AND [2] no improvement using cough treatment per Care Advice  Answer Assessment - Initial Assessment Questions 1. ONSET: "When did the cough begin?"      5 days ago 2. SEVERITY: "How bad is the cough today?"      severe 3. RESPIRATORY DISTRESS: "Describe your breathing."      regular 4. FEVER: "Do you have a fever?" If so, ask: "What is your temperature, how was it measured, and when did it start?"     Mild fever within last couple days 5. SPUTUM: "Describe the color of your sputum" (clear, white, yellow, green)     Yellow and thick small amount. 6. HEMOPTYSIS: "Are you coughing up any blood?" If so ask: "How much?" (flecks, streaks, tablespoons, etc.)     Pinkish look to phlegm with all the coughing. 7. CARDIAC HISTORY: "Do you have any history of heart disease?" (e.g., heart attack, congestive heart failure)      Heart failure. Pulmonary htn 8. LUNG HISTORY: "Do you have any history of lung disease?"  (e.g., pulmonary embolus, asthma, emphysema)     no 9. PE RISK FACTORS: "Do you have a history of blood clots?" (or: recent major surgery, recent prolonged travel, bedridden)     no 10. OTHER SYMPTOMS: "Do you have any other symptoms?" (e.g., runny nose, wheezing, chest pain)       Sore throat and achy 11. PREGNANCY: "Is there any chance you are pregnant?" "When was your last menstrual period?"       na 12. TRAVEL: "Have you traveled out of the country in the last month?" (e.g., travel history, exposures)       no  Protocols used: [employer]

## 2018-02-21 NOTE — Progress Notes (Signed)
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  He states he has not slept in 5 nights.  Symptoms in total have been going on 6 or 7 days.  Seems to get into a rapidfire cough at times.  It is largely nonproductive.  He has a bad sore throat which is worse with the cough.  Cough feels like its deep and into his chest at times.  He has had some chills but no fever.Slight increased edema- urine more concentrated. Still on lasix.  Not much nasal discharge. Not a ton of sinus congestion. No shortness of breath above baseline Does have lack of strength/weak - does not have wheeze as well.No chest pain -started: 7 days ago, symptoms show no change -previous treatments: riccola cough drops, fresh thyme tea with honey, lemon, ginger -sick contacts/travel/risks: denies flu exposure.   ROS-denies fever, NVD, tooth pain or sinus pain. Denies significant shortness of breath.   Pertinent Past Medical History-  Patient Active Problem List   Diagnosis Date Noted  . Episodic lightheadedness 09/03/2016    Priority: High  . Pulmonary hypertension ([place]) 05/24/2014    Priority: High  . S/P CABG x 1 02/09/2013    Priority: High  . Cardiomyopathy ([place]) 01/06/2013    Priority: High  . Chronic systolic heart failure ([place]) 09/11/2010    Priority: High  . Coronary atherosclerosis 03/07/2008    Priority: High  . ATRIAL FIBRILLATION 02/14/2007    Priority: High  . S/P aortic valve repair 02/09/2013    Priority: Medium  . S/P MVR (mitral valve repair) 02/09/2013    Priority: Medium  . OSA (obstructive sleep apnea) 07/13/2012    Priority: Medium  . PSA, INCREASED 12/17/2008    Priority: Medium  . SINUS BRADYCARDIA 08/15/2008    Priority: Medium  . Hyperlipidemia 03/07/2008    Priority: Medium  . Essential hypertension 03/07/2008   Priority: Medium  . Intractable hiccups 06/10/2015    Priority: Low  . Gross hematuria 02/07/2014    Priority: Low  . Age-related macular degeneration, dry 08/06/2013    Priority: Low  . H/O shoulder surgery 01/06/2013    Priority: Low  . Dyspnea on exertion 11/25/2012    Priority: Low  . Anxiety 01/26/2011    Priority: Low  . POSITIONAL VERTIGO 07/13/2008    Priority: Low  . GERD 03/11/2008    Priority: Low  . ALLERGIC RHINITIS 03/07/2008    Priority: Low  . Osteoarthritis 01/01/2017    Medications- reviewed  Current Outpatient Medications  Medication Sig Dispense Refill  . aspirin EC 81 MG tablet Take 1 tablet (81 mg total) by mouth daily. 30 tablet 3  . eplerenone (INSPRA) 25 MG tablet Take 1 tablet (25 mg total) by mouth daily. 30 tablet 11  . magnesium gluconate (MAGONATE) 500 MG tablet Take 500 mg by mouth 2 (two) times daily.     . Omega-3 Fatty Acids (FISH OIL PO) Take 360 mg by mouth daily.     . potassium chloride (K-DUR,KLOR-CON) 10 MEQ tablet Take 2 tablets (20 mEq total) by mouth daily. 60 tablet 3  . torsemide (DEMADEX) 10 MG tablet Take 4 tablets (40 mg total) by mouth daily. May also take 1 tablet (10 mg total) as needed (sob/edema). 150 tablet 1  . valsartan (DIOVAN) 40  MG tablet TAKE 1 AND 1/2 TABLETS BY MOUTH IN THE MORNING AND 2 TABLETS IN THE EVENING (Patient taking differently: Take 40 mg by mouth 2 (two) times daily. ) 105 tablet 3  . guaiFENesin-codeine 100-10 MG/5ML syrup Take 2.5-5 mLs by mouth every 6 (six) hours as needed for cough. 120 mL 0   No current facility-administered medications for this visit.     Objective: BP 126/62 (BP Location: Left Arm, Patient Position: Sitting, Cuff Size: Large)   Pulse 94   Temp 98.8 F (37.1 C) (Oral)   Ht _0  (1.753 m)   Wt 158 lb (71.7 kg)   SpO2 97%   BMI 23.33 kg/m  Gen: Appears extremely fatigued, intermittent cough, wearing mask HEENT: Turbinates erythematous with clear discharge, TM normal,  pharynx moderately erythematous with no tonsilar exudate or edema, no sinus tenderness CV: Irregularly irregular no rubs or gallops Lungs: CTAB no crackles, wheeze, rhonchi.  Did have one coarse breath sounds in left lung field which cleared after cough Ext: no edema Skin: warm, dry, no rash  Results for orders placed or performed in visit on 02/21/18 (from the past 24 hour(s))  POC Influenza A&B(BINAX/QUICKVUE)     Status: None   Collection Time: 02/21/18  5:06 PM  Result Value Ref Range   Influenza A, POC Negative Negative   Influenza B, POC Negative Negative    Assessment/Plan:  Respiratory infection-URI versus bronchitis History and exam today are suggestive of viral infection most likely due to bronchitis.  We discussed hopefully this is an upper respiratory infection and will last up to 2 weeks but with severity of symptoms I am suspicious for bronchitis which may last over 3 weeks.  Influenza testing negative.   symptomatic treatment with: Codeine cough syrup-due to concern for respiratory depression he will start with a half dose at 2.5 mL Considered prednisone: Hold off for now  We discussed that we did not find any infection that had higher probability of being bacterial such as pneumonia, strep throat, ear infection, bacterial sinusitis. We discussed signs that bacterial infection may have developed particularly fever or shortness of breath and reasons for follow up (symptoms worsen, last past expected time frame, new concerns arise).  I told him I was particularly concerned with his comorbidities about potential for bacterial infection or pneumonia so asked him to have a low threshold for calling me back  Likely course of at least 2-3 weeks. Patient is contagious and advised good handwashing. From AVS:  " Bad respiratory infection- with severity suspicious for bronchitis though I dont hear wheeze/rhonchi on exam  Lets try codeine since you have tolerated that in the past.  Drink with half glass of water if doing half dose.   I want to know if any fever or shortness of breath immediately or if you feel like you are not improving despite improved rest at night.   Happy to either see you back or would be willing to call in augmentin or doxycyline per your preference  Read about pelargonium sidoides for URI/bronchitis for future use. I think the product names are Umcka cold care/umckaloaba "  Meds ordered this encounter  Medications  . guaiFENesin-codeine 100-10 MG/5ML syrup    Sig: Take 2.5-5 mLs by mouth every 6 (six) hours as needed for cough.    Dispense:  120 mL    Refill:  0  New acute condition.  High risk patient given chronic systolic heart failure with cardiomyopathy, age, coronary atherosclerosis  Annie Main  Yong Channel, MD

## 2018-02-22 ENCOUNTER — Ambulatory Visit: Payer: Self-pay

## 2018-02-22 MED ORDER — BENZONATATE 100 MG PO CAPS: 100.0000 mg | ORAL_CAPSULE | Freq: Two times a day (BID) | ORAL | 0 refills | Status: DC | PRN

## 2018-02-22 NOTE — Telephone Encounter (Signed)
Pt. Reports he has not slept the past 6 nights. Reports he took the cough syrup with codeine and he had "psychosis" last night - "rapid thoughts, seeing things." Requesting something different because the cough is "so bad". He would like [name] to call him if he could. [phone]. Please advise pt.

## 2018-02-22 NOTE — Telephone Encounter (Signed)
Spoke with patient-added codeine to allergies-we will trial Caleb [name] will call back if not improving-still considering antibiotic trial

## 2018-02-22 NOTE — Telephone Encounter (Signed)
See note °

## 2018-02-22 NOTE — Addendum Note (Signed)
Addended by: Marin Olp on: 02/22/2018 05:41 PM   Modules accepted: Orders

## 2018-02-23 ENCOUNTER — Encounter (HOSPITAL_COMMUNITY): Payer: Medicare Other

## 2018-02-23 ENCOUNTER — Telehealth (HOSPITAL_COMMUNITY): Payer: Self-pay | Admitting: Family Medicine

## 2018-02-24 ENCOUNTER — Ambulatory Visit: Payer: Medicare Other | Admitting: Family Medicine

## 2018-02-24 ENCOUNTER — Encounter: Payer: Self-pay | Admitting: Family Medicine

## 2018-02-24 ENCOUNTER — Ambulatory Visit (INDEPENDENT_AMBULATORY_CARE_PROVIDER_SITE_OTHER): Payer: Medicare Other | Admitting: Family Medicine

## 2018-02-24 ENCOUNTER — Other Ambulatory Visit (HOSPITAL_COMMUNITY): Payer: Self-pay

## 2018-02-24 ENCOUNTER — Ambulatory Visit (INDEPENDENT_AMBULATORY_CARE_PROVIDER_SITE_OTHER): Payer: Medicare Other

## 2018-02-24 VITALS — BP 122/64 | HR 86 | Temp 98.0°F | Ht 69.0 in | Wt 162.0 lb

## 2018-02-24 DIAGNOSIS — J90 Pleural effusion, not elsewhere classified: Secondary | ICD-10-CM | POA: Diagnosis not present

## 2018-02-24 DIAGNOSIS — J181 Lobar pneumonia, unspecified organism: Secondary | ICD-10-CM | POA: Diagnosis not present

## 2018-02-24 DIAGNOSIS — I5022 Chronic systolic (congestive) heart failure: Secondary | ICD-10-CM | POA: Diagnosis not present

## 2018-02-24 DIAGNOSIS — J189 Pneumonia, unspecified organism: Secondary | ICD-10-CM

## 2018-02-24 LAB — CBC WITH DIFFERENTIAL/PLATELET
BASOS ABS: 0.1 10*3/uL (ref 0.0–0.1)
Basophils Relative: 0.6 % (ref 0.0–3.0)
EOS ABS: 0.1 10*3/uL (ref 0.0–0.7)
Eosinophils Relative: 1.1 % (ref 0.0–5.0)
HCT: 36.2 % — ABNORMAL LOW (ref 39.0–52.0)
Hemoglobin: 12.3 g/dL — ABNORMAL LOW (ref 13.0–17.0)
Lymphocytes Relative: 15.6 % (ref 12.0–46.0)
Lymphs Abs: 1.6 10*3/uL (ref 0.7–4.0)
MCHC: 34.1 g/dL (ref 30.0–36.0)
MCV: 89 fl (ref 78.0–100.0)
MONO ABS: 1.6 10*3/uL — AB (ref 0.1–1.0)
MONOS PCT: 14.9 % — AB (ref 3.0–12.0)
NEUTROS ABS: 7.1 10*3/uL (ref 1.4–7.7)
NEUTROS PCT: 67.8 % (ref 43.0–77.0)
PLATELETS: 281 10*3/uL (ref 150.0–400.0)
RBC: 4.07 Mil/uL — AB (ref 4.22–5.81)
RDW: 15 % (ref 11.5–15.5)
WBC: 10.5 10*3/uL (ref 4.0–10.5)

## 2018-02-24 LAB — COMPREHENSIVE METABOLIC PANEL
ALT: 34 U/L (ref 0–53)
AST: 33 U/L (ref 0–37)
Albumin: 3.7 g/dL (ref 3.5–5.2)
Alkaline Phosphatase: 72 U/L (ref 39–117)
BUN: 18 mg/dL (ref 6–23)
CO2: 30 meq/L (ref 19–32)
CREATININE: 1.05 mg/dL (ref 0.40–1.50)
Calcium: 8.6 mg/dL (ref 8.4–10.5)
Chloride: 85 mEq/L — ABNORMAL LOW (ref 96–112)
GFR: 72.35 mL/min (ref >60.00)
GLUCOSE: 91 mg/dL (ref 70–99)
Potassium: 4.3 mEq/L (ref 3.5–5.1)
SODIUM: 124 meq/L — AB (ref 135–145)
TOTAL PROTEIN: 6.9 g/dL (ref 6.0–8.3)
Total Bilirubin: 0.9 mg/dL (ref 0.2–1.2)

## 2018-02-24 LAB — BRAIN NATRIURETIC PEPTIDE: Pro B Natriuretic peptide (BNP): 644 pg/mL — ABNORMAL HIGH (ref 0.0–100.0)

## 2018-02-24 MED ORDER — TORSEMIDE 10 MG PO TABS: ORAL_TABLET | ORAL | 1 refills | Status: DC

## 2018-02-24 MED ORDER — CEFDINIR 300 MG PO CAPS: 300.0000 mg | ORAL_CAPSULE | Freq: Two times a day (BID) | ORAL | 0 refills | Status: DC

## 2018-02-24 MED FILL — TORSEMIDE 10 MG TABLET: 10 | 20 days supply | Qty: 120 | Fill #0

## 2018-02-24 MED FILL — EPLERENONE 25 MG TABLET: 25 | 30 days supply | Qty: 30 | Fill #4

## 2018-02-24 MED FILL — VALSARTAN 40 MG TABLET: 40 | 30 days supply | Qty: 105 | Fill #1

## 2018-02-24 MED FILL — CEFDINIR 300 MG CAPSULE: 300 | 10 days supply | Qty: 20 | Fill #0

## 2018-02-24 MED FILL — POTASSIUM CHL ER M10 TABLET: 10 | 30 days supply | Qty: 60 | Fill #1

## 2018-02-24 NOTE — Progress Notes (Signed)
Patient: Caleb ARMENDARIZ, PhD MRN: [id number] DOB: 01-14-1941 PCP: Marin Olp, MD     Subjective:  Chief Complaint  Patient presents with  . Cough    HPI: The patient is a 77 y.o. male who presents today for worsening cough.  Seen on 12/2 by PCP. Flu test was negative. Diagnosed with bronchitis and given conservative therapy as appeared viral. He was given tessalon pearls and codeine cough syrup. He could not tolerate these.  He started feeling bad the day before thanksgiving. He felt like he had some fluid in his nose/post nasal drip cold like sensation. He was achy and then started coughing. His wife states his cough is beyond all coughs. It is vicious and is keeping him from sleeping. He is not eating much. He is not short of breath and denies any wheezing. Is starting to have some diarrhea, but no abdominal pain.   He also has CHF and has put on 5 pounds and states this is not common for him. No orthopnea, but his legs are swelling. Did take and extra diuretic today. NO SOB.   Review of Systems  Constitutional: Positive for chills and fatigue. Negative for fever.  Respiratory: Positive for cough and shortness of breath.   Cardiovascular: Negative for chest pain.  Gastrointestinal: Positive for diarrhea. Negative for abdominal pain and nausea.  Musculoskeletal: Negative for back pain and neck pain.  Neurological: Negative for dizziness and headaches.  Psychiatric/Behavioral: Positive for sleep disturbance.    Allergies Patient is allergic to carvedilol; codeine; dabigatran; pradaxa [dabigatran etexilate mesylate]; sulfonamide derivatives; and xarelto [rivaroxaban].  Past Medical History Patient  has a past medical history of Allergic rhinitis, Atrial fibrillation ([place]), Atypical pneumonia, CAD (coronary artery disease), Cardiomyopathy, ischemic, Chronic anticoagulation, Cough, Dizziness, GERD (gastroesophageal reflux disease), Heart failure, systolic, acute on chronic ([place]),  Hyperlipidemia, Hypertension, OSA (obstructive sleep apnea), Pleural effusion, Positional vertigo, and TIA (transient ischemic attack).  Surgical History Patient  has a past surgical history that includes Coronary stent placement (2004); Shoulder surgery; TEE without cardioversion (N/A, 09/21/2012); Coronary artery bypass graft (01/09/13); Mitral valve annuloplasty (01/09/13); Aortic valve repair (01/09/13); right heart catheterization (N/A, 05/11/2014); Cardiac catheterization (N/A, 01/10/2016); and RIGHT HEART CATH (N/A, 11/16/2017).  Family History Pateint's family history includes Diabetes in his unknown relative. He was adopted.  Social History Patient  reports that he has never smoked. He has never used smokeless tobacco. He reports that he does not drink alcohol or use drugs.    Objective: Vitals:   02/24/18 1301  BP: 122/64  Pulse: 86  Temp: 98 F (36.7 C)  TempSrc: Oral  SpO2: 95%  Weight: 162 lb (73.5 kg)  Height: _0  (1.753 m)    Body mass index is 23.92 kg/m.  Physical Exam  Constitutional: He appears well-developed and well-nourished.  HENT:  Right Ear: External ear normal.  Left Ear: External ear normal.  Mouth/Throat: Oropharynx is clear and moist.  Tm pearly with light reflex bilaterally  No TTP over sinuses   Neck: Normal range of motion. Neck supple. No thyromegaly present.  Cardiovascular: Normal rate, regular rhythm and normal heart sounds.  +LE edema.   Pulmonary/Chest: Effort normal. No respiratory distress. He has no wheezes. He has rales (LLL).  Abdominal: Soft. Bowel sounds are normal.  Lymphadenopathy:    He has no cervical adenopathy.  Psychiatric: He has a normal mood and affect. His behavior is normal.  Vitals reviewed.     CXR: looks to be a consolidation in LLL  compared to previous film. Official read pending  Assessment/plan: 1. Pneumonia of left lower lobe due to infectious organism [facility]) New consolidation. He can tolerate a zpack,  doxycycline and already has diarrhea. Will do course of omnicef. Is okay with 3rd generation cephalosporins. ER precautions given for fever/worsening cough/shortness of breath. Will let him know what labs/radriologst read is as well.   - DG Chest 2 View - CBC with Differential/Platelet  2. Chronic systolic congestive heart failure ([place]) He's gained 4 pounds in 3 days. No SOB/orthopnea or increased work of breathing. I feel like this is more pneumonia, but will check BNP. Last BNP was around 400. His last echo showed a EF of 25%. Declines defibrillator. Will have him increase his diuretic for next 3 days. Checking electrolytes as well with increased diuretics and a few episodes of diarrhea. He understands ER if SOB/increased work of breathing.  - Comprehensive metabolic panel - Brain natriuretic peptide     Return if symptoms worsen or fail to improve.   Orma Flaming, MD [place]   02/24/2018

## 2018-02-25 ENCOUNTER — Encounter (HOSPITAL_COMMUNITY): Payer: Medicare Other

## 2018-02-25 ENCOUNTER — Telehealth: Payer: Self-pay | Admitting: *Deleted

## 2018-02-25 ENCOUNTER — Other Ambulatory Visit: Payer: Self-pay | Admitting: Family Medicine

## 2018-02-25 NOTE — Progress Notes (Signed)
Will come back in to see [name] for appointment for diarrhea and labs.

## 2018-02-25 NOTE — Telephone Encounter (Signed)
Copied from [place] [id number]. Topic: General - Other >> Feb 25, 2018 12:39 PM Judyann Munson wrote: Reason for CRM: patient is calling to personal speak with Dr.hunter in regards to last OV with [facility] Please advise

## 2018-02-28 ENCOUNTER — Ambulatory Visit: Payer: Self-pay

## 2018-02-28 ENCOUNTER — Ambulatory Visit (INDEPENDENT_AMBULATORY_CARE_PROVIDER_SITE_OTHER): Payer: Medicare Other | Admitting: Family Medicine

## 2018-02-28 ENCOUNTER — Encounter (HOSPITAL_COMMUNITY): Payer: Medicare Other

## 2018-02-28 ENCOUNTER — Encounter: Payer: Self-pay | Admitting: Family Medicine

## 2018-02-28 ENCOUNTER — Ambulatory Visit (HOSPITAL_COMMUNITY)
Admission: RE | Admit: 2018-02-28 | Discharge: 2018-02-28 | Disposition: A | Discharge status: Home / Self Care | Payer: Medicare Other | Source: Ambulatory Visit | Attending: Cardiology | Admitting: Cardiology

## 2018-02-28 ENCOUNTER — Telehealth (HOSPITAL_COMMUNITY): Payer: Self-pay | Admitting: Cardiac Rehabilitation

## 2018-02-28 VITALS — BP 120/60 | HR 75 | Temp 97.9°F | Ht 69.0 in | Wt 153.6 lb

## 2018-02-28 DIAGNOSIS — J181 Lobar pneumonia, unspecified organism: Secondary | ICD-10-CM | POA: Diagnosis not present

## 2018-02-28 DIAGNOSIS — J189 Pneumonia, unspecified organism: Secondary | ICD-10-CM

## 2018-02-28 DIAGNOSIS — I5022 Chronic systolic (congestive) heart failure: Secondary | ICD-10-CM | POA: Diagnosis not present

## 2018-02-28 LAB — BASIC METABOLIC PANEL
ANION GAP: 15 (ref 5–15)
BUN: 15 mg/dL (ref 8–23)
CALCIUM: 9 mg/dL (ref 8.9–10.3)
CO2: 30 mmol/L (ref 22–32)
CREATININE: 1.18 mg/dL (ref 0.61–1.24)
Chloride: 89 mmol/L — ABNORMAL LOW (ref 98–111)
GFR, EST NON AFRICAN AMERICAN: 58 mL/min — AB (ref >60)
Glucose, Bld: 142 mg/dL — ABNORMAL HIGH (ref 70–99)
Potassium: 3.6 mmol/L (ref 3.5–5.1)
Sodium: 134 mmol/L — ABNORMAL LOW (ref 135–145)

## 2018-02-28 LAB — BRAIN NATRIURETIC PEPTIDE: B Natriuretic Peptide: 478.6 pg/mL — ABNORMAL HIGH (ref 0.0–100.0)

## 2018-02-28 LAB — CBC
HCT: 41.1 % (ref 39.0–52.0)
Hemoglobin: 12.9 g/dL — ABNORMAL LOW (ref 13.0–17.0)
MCH: 28.7 pg (ref 26.0–34.0)
MCHC: 31.4 g/dL (ref 30.0–36.0)
MCV: 91.5 fL (ref 80.0–100.0)
NRBC: 0 % (ref 0.0–0.2)
PLATELETS: 345 10*3/uL (ref 150–400)
RBC: 4.49 MIL/uL (ref 4.22–5.81)
RDW: 14.8 % (ref 11.5–15.5)
WBC: 7.7 10*3/uL (ref 4.0–10.5)

## 2018-02-28 NOTE — Progress Notes (Signed)
Patient: Caleb GULLEY, PhD MRN: [id number] DOB: 1940/06/11 PCP: Marin Olp, MD     Subjective:  Chief Complaint  Patient presents with  . med problems    HPI: The patient is a 77 y.o. male who presents today for antibiotic issue. They are wanting to change the omnicef. He states he doubled up the diuretic and he states he is not urinating as much as he thought he would and they have read that it causes urinary retention. He is urinating great today. He also decreased his water intake. He is going to tolerate the antibiotic x 7 days and is okay with this. He is doing better from a cough standpoint. His left foot was swollen, but is better today.   He is getting labs done with cardiology today, so we do not need to do anything regarding rechecking his electrolytes.   Review of Systems  Constitutional: Positive for fatigue.  Respiratory: Positive for cough. Negative for shortness of breath.   Cardiovascular: Positive for leg swelling. Negative for chest pain and palpitations.  Gastrointestinal: Negative for abdominal pain and nausea.  Musculoskeletal: Negative for back pain.  Neurological: Negative for dizziness and headaches.  Psychiatric/Behavioral: Positive for sleep disturbance.    Allergies Patient is allergic to carvedilol; codeine; dabigatran; pradaxa [dabigatran etexilate mesylate]; sulfonamide derivatives; and xarelto [rivaroxaban].  Past Medical History Patient  has a past medical history of Allergic rhinitis, Atrial fibrillation ([place]), Atypical pneumonia, CAD (coronary artery disease), Cardiomyopathy, ischemic, Chronic anticoagulation, Cough, Dizziness, GERD (gastroesophageal reflux disease), Heart failure, systolic, acute on chronic ([place]), Hyperlipidemia, Hypertension, OSA (obstructive sleep apnea), Pleural effusion, Positional vertigo, and TIA (transient ischemic attack).  Surgical History Patient  has a past surgical history that includes Coronary stent placement  (2004); Shoulder surgery; TEE without cardioversion (N/A, 09/21/2012); Coronary artery bypass graft (01/09/13); Mitral valve annuloplasty (01/09/13); Aortic valve repair (01/09/13); right heart catheterization (N/A, 05/11/2014); Cardiac catheterization (N/A, 01/10/2016); and RIGHT HEART CATH (N/A, 11/16/2017).  Family History Pateint's family history includes Diabetes in his unknown relative. He was adopted.  Social History Patient  reports that he has never smoked. He has never used smokeless tobacco. He reports that he does not drink alcohol or use drugs.    Objective: Vitals:   02/28/18 1131  BP: 120/60  Pulse: 75  Temp: 97.9 F (36.6 C)  TempSrc: Temporal  SpO2: 97%  Weight: 153 lb 9.6 oz (69.7 kg)  Height: _0  (1.753 m)    Body mass index is 22.68 kg/m.  Physical Exam  Constitutional: He is oriented to person, place, and time. He appears well-developed and well-nourished.  HENT:  Right Ear: External ear normal.  Left Ear: External ear normal.  Mouth/Throat: Oropharynx is clear and moist.  Tm pearly with light reflex bilaterally    Neck: Normal range of motion. Neck supple.  Cardiovascular: Normal rate, regular rhythm and normal heart sounds.  +left LLE to above ankle. 1-2+pitting  Pulmonary/Chest: Effort normal and breath sounds normal.  No rales/no wheezing.   Abdominal: Soft. Bowel sounds are normal.  Lymphadenopathy:    He has no cervical adenopathy.  Neurological: He is alert and oriented to person, place, and time.  Vitals reviewed.      Assessment/plan: 1. Pneumonia of left lower lobe due to infectious organism [facility]) Lung exam much improved today with no rales. Would like him to continue his abx x 7 days. Im not convinced it's the antibiotic causing his swelling and he has no signs of urinary retention. Discussed  it's really important we treat pneumonia.  He is onboard with this. He will do a f/u CXR in 4-6 weeks with PCP. He has f/u labs with cardiology today  to check sodium/electrolytes. Unsure why his left ankle is more swollen. Does not appear much changed from when I saw him last Thursday. Diuresing better today and down 9 pounds. Discussed may have been because he restricted his fluid intake that his urine output decreased. Nonetheless he will complete abx course x 7 days. F/u with pcp and let us know if worsening symptoms.   >30 minutes spent in face to face contact discussing care and answering questions.    Return if symptoms worsen or fail to improve.   Orma Flaming, MD [place]   02/28/2018

## 2018-02-28 NOTE — Telephone Encounter (Signed)
Incoming call from Patient states that he was talking with his Heart Dr.  This morning.  Recommended doubling his his Lasix related to the fact that Dr.  Rogers Blocker Rx an antibiotic that has a side effect that causes Urine Retention.  States that he ankles or swollen.  Urine out put has decreased.  Has "somewhat increased over the weekend, but not like before.  Patient is to go to Heart failure clinic for labs to be drawn today.   Patient request for Dr.  Rogers Blocker or Dr.  Yong Channel to to change antibiotic and REQUEST FOR A RETURN CALL PATIENT ASAP THIS MORNING.  Did not want an appointment.  Preferred to discuss issue , over the phone.  Patient awaits to hear from Either MD, Rogers Blocker or [place].    Reason for Disposition . All other urine symptoms  Answer Assessment - Initial Assessment Questions 1. SYMPTOM: "What's the main symptom you're concerned about?" (e.g., frequency, incontinence)     Urinary retention due to antibiotic 2. ONSET: "When did the Frtiday  start?"     *No Answer* 3. PAIN: "Is there any pain?" If so, ask: "How bad is it?" (Scale: 1-10; mild, moderate, severe)     No pain 4. CAUSE: "What do you think is causing the symptoms?"     antibiotic 5. OTHER SYMPTOMS: "Do you have any other symptoms?" (e.g., fever, flank pain, blood in urine, pain with urination)     no 6. PREGNANCY: "Is there any chance you are pregnant?" "When was your last menstrual period?"     na  Protocols used: URINARY [facility]

## 2018-02-28 NOTE — Telephone Encounter (Signed)
Spoke with [name]- he is not concerned from cardiac perspective about omnicef. He basically told patient that we could discuss shortening duration due to patient concerns about the medicine. I discussed with patient possibly moving to 7 days. He has labs set up with cardiology later today. Offered appointment with me on Wednesday to check in but he declines and prefers to see Dr. Rogers Blocker today.

## 2018-02-28 NOTE — Telephone Encounter (Signed)
Phone message received from pt stating he will be absent from cardaic rehab for continued cold like symptoms.  Unable to reach pt with return call. LMOM. Andi Hence, RN, BSN Cardiac Pulmonary Rehab 02/28/18 12:33 PM

## 2018-03-02 ENCOUNTER — Encounter (HOSPITAL_COMMUNITY): Payer: Medicare Other

## 2018-03-02 ENCOUNTER — Encounter (HOSPITAL_COMMUNITY): Payer: Self-pay

## 2018-03-02 NOTE — Progress Notes (Signed)
Cardiac Individual Treatment Plan  Patient Details  Name: Caleb FREID, PhD MRN: [id number] Date of Birth: 09-22-1940 Referring Provider:   Flowsheet Row CARDIAC REHAB PHASE II ORIENTATION from 11/30/2017 in [facility]  Referring Provider  Loralie Champagne, MD.      Initial Encounter Date:  Flowsheet Row CARDIAC REHAB PHASE II ORIENTATION from 11/30/2017 in [facility]  Date  11/30/17      Visit Diagnosis: 57/2620 Chronic systolic congestive heart failure ([place])  Patient's Home Medications on Admission:  Current Outpatient Medications:  .  aspirin EC 81 MG tablet, Take 1 tablet (81 mg total) by mouth daily., Disp: 30 tablet, Rfl: 3 .  cefdinir (OMNICEF) 300 MG capsule, Take 1 capsule (300 mg total) by mouth 2 (two) times daily., Disp: 20 capsule, Rfl: 0 .  eplerenone (INSPRA) 25 MG tablet, Take 1 tablet (25 mg total) by mouth daily., Disp: 30 tablet, Rfl: 11 .  magnesium gluconate (MAGONATE) 500 MG tablet, Take 500 mg by mouth 2 (two) times daily. , Disp: , Rfl:  .  Omega-3 Fatty Acids (FISH OIL PO), Take 360 mg by mouth daily. , Disp: , Rfl:  .  potassium chloride (K-DUR,KLOR-CON) 10 MEQ tablet, Take 2 tablets (20 mEq total) by mouth daily., Disp: 60 tablet, Rfl: 3 .  torsemide (DEMADEX) 10 MG tablet, Take 4 tablets (40 mg total) by mouth daily. May also take 1 tablet (10 mg total) as needed (sob/edema)., Disp: 150 tablet, Rfl: 1 .  valsartan (DIOVAN) 40 MG tablet, TAKE 1 AND 1/2 TABLETS BY MOUTH IN THE MORNING AND 2 TABLETS IN THE EVENING (Patient taking differently: Take 40 mg by mouth 2 (two) times daily. ), Disp: 105 tablet, Rfl: 3  Past Medical History: Past Medical History:  Diagnosis Date  . Allergic rhinitis   . Atrial fibrillation ([place])   . Atypical pneumonia   . CAD (coronary artery disease)   . Cardiomyopathy, ischemic   . Chronic anticoagulation   . Cough   . Dizziness   . GERD (gastroesophageal reflux  disease)   . Heart failure, systolic, acute on chronic ([place])   . Hyperlipidemia   . Hypertension   . OSA (obstructive sleep apnea)    Home sleep test 07/05/2009 AHI 8.2  . Pleural effusion   . Positional vertigo   . TIA (transient ischemic attack)     Tobacco Use: Social History   Tobacco Use  Smoking Status Never Smoker  Smokeless Tobacco Never Used    Labs: Recent Review Flowsheet Data    Labs for ITP Cardiac and Pulmonary Rehab Latest Ref Rng & Units 01/10/2016 01/10/2016 08/17/2017 10/07/2017 11/16/2017   Cholestrol 0 - 200 mg/dL - - 212(H) 206(H) -   LDLCALC 0 - 99 mg/dL - - 140(H) 136(H) -   LDLDIRECT mg/dL - - - - -   HDL >40 mg/dL - - 55 53 -   Trlycerides <150 mg/dL - - 86 84 -   Hemoglobin A1c 4.8 - 5.6 % - - 5.5 - -   PHART 7.350 - 7.450 - - - - -   PCO2ART 35.0 - 45.0 mmHg - - - - -   HCO3 20.0 - 28.0 mmol/L 26.7 26.1 - - 24.8   TCO2 22 - 32 mmol/L 28 27 - - 26   ACIDBASEDEF 0.0 - 2.0 mmol/L - - - - -   O2SAT % 68.0 67.0 - - 69.0      Capillary  Blood Glucose: No results found for: GLUCAP   Exercise Target Goals: Exercise Program Goal: Individual exercise prescription set using results from initial 6 min walk test and THRR while considering  patient's activity barriers and safety.   Exercise Prescription Goal: Initial exercise prescription builds to 30-45 minutes a day of aerobic activity, 2-3 days per week.  Home exercise guidelines will be given to patient during program as part of exercise prescription that the participant will acknowledge.  Activity Barriers & Risk Stratification: Activity Barriers & Cardiac Risk Stratification - 11/30/17 1405    Activity Barriers & Cardiac Risk Stratification          Activity Barriers  Shortness of Breath;Other (comment)    Comments  Lightheadedness. OA- hips.    Cardiac Risk Stratification  High           6 Minute Walk: 6 Minute Walk    6 Minute Walk    Row Name 11/30/17 1350 02/16/18 1444   Phase   Initial  Discharge   Distance  1838 feet  1838 feet   Distance % Change  no documentation  0 %   Distance Feet Change  no documentation  0 ft   Walk Time  6 minutes  6 minutes   # of Rest Breaks  0  0   MPH  3.48  3.48   METS  4.13  3.84   RPE  15  12   VO2 Peak  14.44  13.45   Symptoms  No  No   Resting HR  68 bpm  77 bpm   Resting BP  124/70  130/68   Resting Oxygen Saturation   97 %  no documentation   Exercise Oxygen Saturation  during 6 min walk  98 %  no documentation   Max Ex. HR  118 bpm  112 bpm   Max Ex. BP  164/78  140/70   2 Minute Post BP  124/72  118/72          Oxygen Initial Assessment:   Oxygen Re-Evaluation:   Oxygen Discharge (Final Oxygen Re-Evaluation):   Initial Exercise Prescription: Initial Exercise Prescription - 11/30/17 1500    Date of Initial Exercise RX and Referring Provider          Date  11/30/17    Referring Provider  Loralie Champagne, MD.        Bike          Level  1    Minutes  10    METs  3.7        NuStep          Level  3    SPM  85    Minutes  10    METs  3.4        Track          Laps  15    Minutes  10    METs  3.41        Prescription Details          Frequency (times per week)  3    Duration  Progress to 30 minutes of continuous aerobic without signs/symptoms of physical distress        Intensity          THRR 40-80% of Max Heartrate  56-113    Ratings of Perceived Exertion  11-13    Perceived Dyspnea  0-4        Progression  Progression  Continue to progress workloads to maintain intensity without signs/symptoms of physical distress.        Resistance Training          Training Prescription  Yes    Weight  3lbs    Reps  10-15           Perform Capillary Blood Glucose checks as needed.  Exercise Prescription Changes: Exercise Prescription Changes    Response to Exercise    Row Name 12/06/17 1332 12/20/17 1328 01/03/18 1143 01/19/18 1315 02/08/18 1544   Blood Pressure  (Admit)  120/60  138/70  138/68  142/60  122/64   Blood Pressure (Exercise)  164/74  142/62  142/62  144/82  138/72   Blood Pressure (Exit)  126/70  130/64  138/72  130/74  122/70   Heart Rate (Admit)  81 bpm  85 bpm  79 bpm  79 bpm  78 bpm   Heart Rate (Exercise)  104 bpm  111 bpm  107 bpm  110 bpm  105 bpm   Heart Rate (Exit)  81 bpm  84 bpm  73 bpm  79 bpm  71 bpm   Rating of Perceived Exertion (Exercise)  _0 Symptoms  none  none  none  none  none   Comments  Decreased bike workloads, too difficult.  no documentation  no documentation  no documentation  no documentation   Duration  Progress to 30 minutes of  aerobic without signs/symptoms of physical distress  Progress to 30 minutes of  aerobic without signs/symptoms of physical distress  Progress to 30 minutes of  aerobic without signs/symptoms of physical distress  Progress to 30 minutes of  aerobic without signs/symptoms of physical distress  Progress to 30 minutes of  aerobic without signs/symptoms of physical distress   Intensity  THRR unchanged  THRR unchanged  THRR unchanged  THRR unchanged  THRR unchanged       Progression    Row Name 12/06/17 1332 12/20/17 1328 01/03/18 1143 01/19/18 1315 02/08/18 1544   Progression  Continue to progress workloads to maintain intensity without signs/symptoms of physical distress.  Continue to progress workloads to maintain intensity without signs/symptoms of physical distress.  Continue to progress workloads to maintain intensity without signs/symptoms of physical distress.  Continue to progress workloads to maintain intensity without signs/symptoms of physical distress.  Continue to progress workloads to maintain intensity without signs/symptoms of physical distress.   Average METs  2.9  3.1  3.21  3.67  3.56       Resistance Training    Row Name 12/06/17 1332 12/20/17 1328 01/03/18 1143 01/19/18 1315 02/08/18 1544   Training Prescription  No  Yes  Yes  No  Yes   Weight  no  documentation  4lbs  3lbs  no documentation  4 lbs.    Reps  no documentation  10-15  10-15  no documentation  10-15   Time  no documentation  10 Minutes  10 Minutes  no documentation  10 Minutes       Interval Training    Row Name 12/06/17 1332 12/20/17 1328 01/03/18 1143 01/19/18 1315 02/08/18 1544   Interval Training  No  No  No  No  No       Bike    Row Name 12/06/17 1332 12/20/17 1328 01/03/18 1143 01/19/18 1315 02/08/18 1544   Level  0.5 Decrease workload, too hard.  0.5  1  1  1   Minutes  _0 METs  2.34  2.33  3.67  3.67  3.7       NuStep    Row Name 12/06/17 1332 12/20/17 1328 01/03/18 1143 01/19/18 1315 02/08/18 1544   Level  _1 SPM  85  85  85  85  85   Minutes  _2 METs  2.6  3.3  3.1  3.2  3.2       Arm Ergometer    Row Name 12/06/17 1332 12/20/17 1328 01/03/18 1143 01/19/18 1315 02/08/18 1544   Level  no documentation  no documentation  1  no documentation  no documentation   [place]  no documentation  no documentation  25  no documentation  no documentation   Minutes  no documentation  no documentation  10  no documentation  no documentation   METs  no documentation  no documentation  2.87  no documentation  no documentation       Track    Row Name 12/06/17 1332 12/20/17 1328 01/03/18 1143 01/19/18 1315 02/08/18 1544   Laps  16  16  no documentation  18  16   Minutes  10  10  no documentation  10  10   METs  3.79  3.79  no documentation  4.14  3.78       Response to Exercise    Row Name 02/14/18 1605 02/28/18 1500   Blood Pressure (Admit)  122/64  130/68   Blood Pressure (Exercise)  138/72  140/70   Blood Pressure (Exit)  122/70  118/72   Heart Rate (Admit)  78 bpm  77 bpm   Heart Rate (Exercise)  105 bpm  112 bpm   Heart Rate (Exit)  71 bpm  86 bpm   Rating of Perceived Exertion (Exercise)  12  12   Symptoms  none  none   Duration  Progress to 30 minutes of  aerobic without signs/symptoms of physical  distress  Progress to 30 minutes of  aerobic without signs/symptoms of physical distress   Intensity  THRR unchanged  THRR unchanged       Progression    Row Name 02/14/18 1605 02/28/18 1500   Progression  Continue to progress workloads to maintain intensity without signs/symptoms of physical distress.  Continue to progress workloads to maintain intensity without signs/symptoms of physical distress.   Average METs  3.56  3.6       Resistance Training    Row Name 02/14/18 1605 02/28/18 1500   Training Prescription  Yes  No   Weight  4 lbs.   no documentation   Reps  10-15  no documentation   Time  10 Minutes  no documentation       Interval Training    Row Name 02/14/18 1605 02/28/18 1500   Interval Training  No  No       Bike    Row Name 02/14/18 1605 02/28/18 1500   Level  1  1   Minutes  10  10   METs  3.7  3.7       NuStep    Row Name 02/14/18 1605 02/28/18 1500   Level  5  5   SPM  85  85   Minutes  10  10   METs  3.2  3.4       Track    Row Name 02/14/18 1605 02/28/18 1500   Laps  16  16   Minutes  10  10   METs  3.78  3.78       Home Exercise Plan    Row Name 02/14/18 1605 02/28/18 1500   Plans to continue exercise at  Home (comment)  Home (comment)   Frequency  Add 3 additional days to program exercise sessions.  Add 3 additional days to program exercise sessions.   Initial Home Exercises Provided  01/26/18  01/26/18          Exercise Comments: Exercise Comments    Row Name 12/06/17 1420 12/20/17 1353 01/05/18 1623 01/20/18 0947 02/09/18 1436   Exercise Comments  Patient tolerated exercise fairly well, decreased workload on aridyne due to dificulty.  Reviewed METs and goals with patient.  Reviewed METs and goals with patient.   Pt is increasing MET level each session. Will continue to exercise at home.   Reviewed METs and goals with Pt. Pt will continue exercising at home in addition to cardiac rehab.    [place] Name 03/02/18 1034   Exercise Comments   Reviewed METs and goals with Pt. Pt will continue exercising at home in addition to cardiac rehab.       Exercise Goals and Review: Exercise Goals    Exercise Goals    Row Name 11/30/17 1524   Increase Physical Activity  Yes   Intervention  Provide advice, education, support and counseling about physical activity/exercise needs.;Develop an individualized exercise prescription for aerobic and resistive training based on initial evaluation findings, risk stratification, comorbidities and participant's personal goals.   Expected Outcomes  Short Term: Attend rehab on a regular basis to increase amount of physical activity.;Long Term: Exercising regularly at least 3-5 days a week.;Long Term: Add in home exercise to make exercise part of routine and to increase amount of physical activity.   Increase Strength and Stamina  Yes   Intervention  Provide advice, education, support and counseling about physical activity/exercise needs.;Develop an individualized exercise prescription for aerobic and resistive training based on initial evaluation findings, risk stratification, comorbidities and participant's personal goals.   Expected Outcomes  Short Term: Increase workloads from initial exercise prescription for resistance, speed, and METs.;Short Term: Perform resistance training exercises routinely during rehab and add in resistance training at home;Long Term: Improve cardiorespiratory fitness, muscular endurance and strength as measured by increased METs and functional capacity (6MWT)   Able to understand and use rate of perceived exertion (RPE) scale  Yes   Intervention  Provide education and explanation on how to use RPE scale   Expected Outcomes  Short Term: Able to use RPE daily in rehab to express subjective intensity level;Long Term:  Able to use RPE to guide intensity level when exercising independently   Knowledge and understanding of Target Heart Rate Range (THRR)  Yes   Intervention  Provide  education and explanation of THRR including how the numbers were predicted and where they are located for reference   Expected Outcomes  Long Term: Able to use THRR to govern intensity when exercising independently;Short Term: Able to state/look up THRR;Short Term: Able to use daily as guideline for intensity in rehab   Able to check pulse independently  Yes   Intervention  Provide education and demonstration on how to check pulse in carotid and radial arteries.;Review the importance of being able to check your own pulse for  safety during independent exercise   Expected Outcomes  Short Term: Able to explain why pulse checking is important during independent exercise;Long Term: Able to check pulse independently and accurately   Understanding of Exercise Prescription  Yes   Intervention  Provide education, explanation, and written materials on patient's individual exercise prescription   Expected Outcomes  Short Term: Able to explain program exercise prescription;Long Term: Able to explain home exercise prescription to exercise independently          Exercise Goals Re-Evaluation : Exercise Goals Re-Evaluation    Exercise Goal Re-Evaluation    Row Name 12/06/17 1420 12/20/17 1353 01/05/18 1621 01/20/18 0946 02/09/18 1434   Exercise Goals Review  Able to understand and use rate of perceived exertion (RPE) scale  Able to understand and use rate of perceived exertion (RPE) scale;Increase Physical Activity  Increase Physical Activity;Increase Strength and Stamina;Knowledge and understanding of Target Heart Rate Range (THRR);Able to understand and use rate of perceived exertion (RPE) scale;Understanding of Exercise Prescription;Able to check pulse independently  Increase Physical Activity;Increase Strength and Stamina;Knowledge and understanding of Target Heart Rate Range (THRR);Able to understand and use rate of perceived exertion (RPE) scale;Understanding of Exercise Prescription;Able to check pulse  independently  Increase Physical Activity;Increase Strength and Stamina;Able to understand and use rate of perceived exertion (RPE) scale;Knowledge and understanding of Target Heart Rate Range (THRR);Understanding of Exercise Prescription;Able to check pulse independently   Comments  Patient able to understand and use RPE scale appropriately.  Patient is walking 30 minutes, 2-3 days/week in addition to exercise at cardiac rehab.  Patient will continue to walk 3-4 days per week for 30 minutes in addition to CR exercise.   Pt is increasing MET level and has a MET level of 3.67. Tolerates exercise well. Exercises at home in addition to Cardiac Rehab.   Reviewed METs and goals with Pt. MET level is 3.56. Pt will continue to walk at home for 30 minutes 2-3 days per week in addition to cardiac rehab.    Expected Outcomes  Increase workloads as tolerated to help improve endurance and cardiac function.  Patient will continue current exercise routine: 30 minutes, 5-6 days/week to help achieve personal health and fitness goals.  Will continue to monitor and progress Pt as tolerated.   Will continue to monitor and progress Pt as tolerated.   Will continue to monitor and progress Pt as tolerated.        Exercise Goal Re-Evaluation    Row Name 03/02/18 1033   Exercise Goals Review  Increase Physical Activity;Increase Strength and Stamina;Able to understand and use rate of perceived exertion (RPE) scale;Knowledge and understanding of Target Heart Rate Range (THRR);Able to check pulse independently;Understanding of Exercise Prescription   Comments  Reviewed METs and goals with Pt. MET level is 3.6. Pt will continue to walk at home for 30 minutes 2-3 days per week in addition to cardiac rehab.    Expected Outcomes  Will continue to monitor and progress Pt as tolerated.           Discharge Exercise Prescription (Final Exercise Prescription Changes): Exercise Prescription Changes - 02/28/18 1500    Response to  Exercise          Blood Pressure (Admit)  130/68    Blood Pressure (Exercise)  140/70    Blood Pressure (Exit)  118/72    Heart Rate (Admit)  77 bpm    Heart Rate (Exercise)  112 bpm    Heart Rate (Exit)  86 bpm  Rating of Perceived Exertion (Exercise)  12    Symptoms  none    Duration  Progress to 30 minutes of  aerobic without signs/symptoms of physical distress    Intensity  THRR unchanged        Progression          Progression  Continue to progress workloads to maintain intensity without signs/symptoms of physical distress.    Average METs  3.6        Resistance Training          Training Prescription  No        Interval Training          Interval Training  No        Bike          Level  1    Minutes  10    METs  3.7        NuStep          Level  5    SPM  85    Minutes  10    METs  3.4        Track          Laps  16    Minutes  10    METs  3.78        Home Exercise Plan          Plans to continue exercise at  Home (comment)    Frequency  Add 3 additional days to program exercise sessions.    Initial Home Exercises Provided  01/26/18           Nutrition:  Target Goals: Understanding of nutrition guidelines, daily intake of sodium <154m, cholesterol <2015m calories 30% from fat and 7% or less from saturated fats, daily to have 5 or more servings of fruits and vegetables.  Biometrics: Pre Biometrics - 11/30/17 1340    Pre Biometrics          Height  _0  (1.753 m)    Weight  70.4 kg    Waist Circumference  34 inches    Hip Circumference  38 inches    Waist to Hip Ratio  0.89 %    BMI (Calculated)  22.91    Triceps Skinfold  12 mm    % Body Fat  22.4 %    Grip Strength  36 kg    Flexibility  0 in    Single Leg Stand  3.31 seconds          Post Biometrics - 02/16/18 1444     Post  Biometrics          Height  _1  (1.753 m)    Weight  71 kg    Waist Circumference  34 inches    Hip Circumference  38 inches    Waist to  Hip Ratio  0.89 %    BMI (Calculated)  23.1    Triceps Skinfold  13 mm    % Body Fat  22.8 %    Grip Strength  35 kg    Flexibility  0 in    Single Leg Stand  3.5 seconds           Nutrition Therapy Plan and Nutrition Goals: Nutrition Therapy & Goals - 11/30/17 1438    Nutrition Therapy          Diet  heart healthy, consistent carbohydrate        Personal Nutrition Goals  Nutrition Goal  Pt to identify and limit food sources of saturated fat, trans fat, refined carbohydrates and sodium    Personal Goal #2  Pt able to name foods that affect blood glucose        Intervention Plan          Intervention  Prescribe, educate and counsel regarding individualized specific dietary modifications aiming towards targeted core components such as weight, hypertension, lipid management, diabetes, heart failure and other comorbidities.    Expected Outcomes  Short Term Goal: Understand basic principles of dietary content, such as calories, fat, sodium, cholesterol and nutrients.;Long Term Goal: Adherence to prescribed nutrition plan.           Nutrition Assessments: Nutrition Assessments - 11/30/17 1439    MEDFICTS Scores          Pre Score  15           Nutrition Goals Re-Evaluation:   Nutrition Goals Re-Evaluation:   Nutrition Goals Discharge (Final Nutrition Goals Re-Evaluation):   Psychosocial: Target Goals: Acknowledge presence or absence of significant depression and/or stress, maximize coping skills, provide positive support system. Participant is able to verbalize types and ability to use techniques and skills needed for reducing stress and depression.  Initial Review & Psychosocial Screening: Initial Psych Review & Screening - 11/30/17 1502    Initial Review          Current issues with  None Identified        Family Dynamics          Good Support System?  Yes   spouse, family, friends        Barriers          Psychosocial barriers to participate  in program  There are no identifiable barriers or psychosocial needs.        Screening Interventions          Interventions  Encouraged to exercise           Quality of Life Scores: Quality of Life - 01/26/18 1416    Quality of Life Scores          Health/Function Pre  14.73 %   pt notes minimal but noticeable improvement in DOE and fatigue. pt unsure if exercise or prescribe supplements are cause of improvement. pt encouraged to continue exercise and congratulated on his improved symptoms.    Socioeconomic Pre  23.19 %    Psych/Spiritual Pre  19.5 %    Family Pre  24 %    GLOBAL Pre  18.94 %   overall pt verbalizes good coping skills and resources.  pt encouraged gratitude, exercise and socialization are good tools for improved outlook         Scores of 19 and below usually indicate a poorer quality of life in these areas.  A difference of  2-3 points is a clinically meaningful difference.  A difference of 2-3 points in the total score of the Quality of Life Index has been associated with significant improvement in overall quality of life, self-image, physical symptoms, and general health in studies assessing change in quality of life.  PHQ-9: Recent Review Flowsheet Data    Depression screen [facility] 2/9 01/19/2018 02/27/2016 02/07/2014   Decreased Interest 0 0 0   Down, Depressed, Hopeless 0 0 0   PHQ - 2 Score 0 0 0     Interpretation of Total Score  Total Score Depression Severity:  1-4 = Minimal depression, 5-9 = Mild depression, 10-14 =  Moderate depression, 15-19 = Moderately severe depression, 20-27 = Severe depression   Psychosocial Evaluation and Intervention: Psychosocial Evaluation - 12/06/17 1445    Psychosocial Evaluation & Interventions          Interventions  Encouraged to exercise with the program and follow exercise prescription    Comments  no psychosocial needs identified, no interventions necessary . pt enjoys reading and researching things.     Expected  Outcomes  pt will exhibit positive outlook with good coping skills.     Continue Psychosocial Services   No Follow up required           Psychosocial Re-Evaluation: Psychosocial Re-Evaluation    Psychosocial Re-Evaluation    Row Name 12/23/17 0734 01/20/18 1144 02/10/18 1203 03/02/18 1748   Current issues with  None Identified  None Identified  None Identified  no documentation   Comments  no psychosocial needs identified, no interventions necessary   no psychosocial needs identified, no interventions necessary  no psychosocial needs identified, no interventions necessary  no psychosocial needs identified, no interventions necessary   Expected Outcomes  pt will exhibit positive outlook with good coping skills.   pt will exhibit positive outlook with good coping skills.   pt will exhibit positive outlook with good coping skills.   pt will exhibit positive outlook with good coping skills.    Interventions  Encouraged to attend Cardiac Rehabilitation for the exercise  Encouraged to attend Cardiac Rehabilitation for the exercise  Encouraged to attend Cardiac Rehabilitation for the exercise  Encouraged to attend Cardiac Rehabilitation for the exercise   Continue Psychosocial Services   No Follow up required  No Follow up required  No Follow up required  No Follow up required          Psychosocial Discharge (Final Psychosocial Re-Evaluation): Psychosocial Re-Evaluation - 03/02/18 1748    Psychosocial Re-Evaluation          Comments  no psychosocial needs identified, no interventions necessary    Expected Outcomes  pt will exhibit positive outlook with good coping skills.     Interventions  Encouraged to attend Cardiac Rehabilitation for the exercise    Continue Psychosocial Services   No Follow up required           Vocational Rehabilitation: Provide vocational rehab assistance to qualifying candidates.   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 Participant will state understanding/return demonstration of topics presented.  Learning Barriers/Preferences:   Education Topics: Count Your Pulse:  -Group instruction provided by verbal instruction, demonstration, patient participation and written materials to support subject.  Instructors address importance of being able to find your pulse and how to count your pulse when at home without a heart monitor.  Patients get hands on experience counting their pulse with staff help and individually.   Heart Attack, Angina, and Risk Factor Modification:  -Group instruction provided by verbal instruction, video, and written materials to support subject.  Instructors address signs and symptoms of angina and heart attacks.    Also discuss risk factors for heart disease and how to make changes to improve heart health risk factors.   Functional Fitness:  -Group  instruction provided by verbal instruction, demonstration, patient participation, and written materials to support subject.  Instructors address safety measures for doing things around the house.  Discuss how to get up and down off the floor, how to pick things up properly, how to safely get out of a chair without assistance, and balance training.   Meditation and Mindfulness:  -Group instruction provided by verbal instruction, patient participation, and written materials to support subject.  Instructor addresses importance of mindfulness and meditation practice to help reduce stress and improve awareness.  Instructor also leads participants through a meditation exercise.    Stretching for Flexibility and Mobility:  -Group instruction provided by verbal instruction, patient  participation, and written materials to support subject.  Instructors lead participants through series of stretches that are designed to increase flexibility thus improving mobility.  These stretches are additional exercise for major muscle groups that are typically performed during regular warm up and cool down.   Hands Only CPR:  -Group verbal, video, and participation provides a basic overview of AHA guidelines for community CPR. Role-play of emergencies allow participants the opportunity to practice calling for help and chest compression technique with discussion of AED use.   Hypertension: -Group verbal and written instruction that provides a basic overview of hypertension including the most recent diagnostic guidelines, risk factor reduction with self-care instructions and medication management.    Nutrition I class: Heart Healthy Eating:  -Group instruction provided by PowerPoint slides, verbal discussion, and written materials to support subject matter. The instructor gives an explanation and review of the Therapeutic Lifestyle Changes diet recommendations, which includes a discussion on lipid goals, dietary fat, sodium, fiber, plant stanol/sterol esters, sugar, and the components of a well-balanced, healthy diet.   Nutrition II class: Lifestyle Skills:  -Group instruction provided by PowerPoint slides, verbal discussion, and written materials to support subject matter. The instructor gives an explanation and review of label reading, grocery shopping for heart health, heart healthy recipe modifications, and ways to make healthier choices when eating out.   Diabetes Question & Answer:  -Group instruction provided by PowerPoint slides, verbal discussion, and written materials to support subject matter. The instructor gives an explanation and review of diabetes co-morbidities, pre- and post-prandial blood glucose goals, pre-exercise blood glucose goals, signs, symptoms, and treatment of  hypoglycemia and hyperglycemia, and foot care basics.   Diabetes Blitz:  -Group instruction provided by PowerPoint slides, verbal discussion, and written materials to support subject matter. The instructor gives an explanation and review of the physiology behind type 1 and type 2 diabetes, diabetes medications and rational behind using different medications, pre- and post-prandial blood glucose recommendations and Hemoglobin A1c goals, diabetes diet, and exercise including blood glucose guidelines for exercising safely.    Portion Distortion:  -Group instruction provided by PowerPoint slides, verbal discussion, written materials, and food models to support subject matter. The instructor gives an explanation of serving size versus portion size, changes in portions sizes over the last 20 years, and what consists of a serving from each food group.   Stress Management:  -Group instruction provided by verbal instruction, video, and written materials to support subject matter.  Instructors review role of stress in heart disease and how to cope with stress positively.     Exercising on Your Own:  -Group instruction provided by verbal instruction, power point, and written materials to support subject.  Instructors discuss benefits of exercise, components of exercise, frequency and intensity of exercise, and end points for exercise.  Also  discuss use of nitroglycerin and activating EMS.  Review options of places to exercise outside of rehab.  Review guidelines for sex with heart disease.   Cardiac Drugs I:  -Group instruction provided by verbal instruction and written materials to support subject.  Instructor reviews cardiac drug classes: antiplatelets, anticoagulants, beta blockers, and statins.  Instructor discusses reasons, side effects, and lifestyle considerations for each drug class.   Cardiac Drugs II:  -Group instruction provided by verbal instruction and written materials to support subject.   Instructor reviews cardiac drug classes: angiotensin converting enzyme inhibitors (ACE-I), angiotensin II receptor blockers (ARBs), nitrates, and calcium channel blockers.  Instructor discusses reasons, side effects, and lifestyle considerations for each drug class.   Anatomy and Physiology of the Circulatory System:  Group verbal and written instruction and models provide basic cardiac anatomy and physiology, with the coronary electrical and arterial systems. Review of: AMI, Angina, Valve disease, Heart Failure, Peripheral Artery Disease, Cardiac Arrhythmia, Pacemakers, and the ICD.   Other Education:  -Group or individual verbal, written, or video instructions that support the educational goals of the cardiac rehab program.   Holiday Eating Survival Tips:  -Group instruction provided by PowerPoint slides, verbal discussion, and written materials to support subject matter. The instructor gives patients tips, tricks, and techniques to help them not only survive but enjoy the holidays despite the onslaught of food that accompanies the holidays.   Knowledge Questionnaire Score: Knowledge Questionnaire Score - 11/30/17 1503    Knowledge Questionnaire Score          Pre Score  22/24           Core Components/Risk Factors/Patient Goals at Admission: Personal Goals and Risk Factors at Admission - 11/30/17 1522    Core Components/Risk Factors/Patient Goals on Admission          Improve shortness of breath with ADL's  Yes    Intervention  Provide education, individualized exercise plan and daily activity instruction to help decrease symptoms of SOB with activities of daily living.    Expected Outcomes  Short Term: Improve cardiorespiratory fitness to achieve a reduction of symptoms when performing ADLs;Long Term: Be able to perform more ADLs without symptoms or delay the onset of symptoms    Intervention  Provide education on lifestyle modifcations including regular physical  activity/exercise, weight management, moderate sodium restriction and increased consumption of fresh fruit, vegetables, and low fat dairy, alcohol moderation, and smoking cessation.;Monitor prescription use compliance.    Expected Outcomes  Short Term: Continued assessment and intervention until BP is < 140/42m HG in hypertensive participants. < 130/847mHG in hypertensive participants with diabetes, heart failure or chronic kidney disease.;Long Term: Maintenance of blood pressure at goal levels.    Lipids  Yes    Intervention  Provide education and support for participant on nutrition & aerobic/resistive exercise along with prescribed medications to achieve LDL <7065mHDL >59m100m  Expected Outcomes  Short Term: Participant states understanding of desired cholesterol values and is compliant with medications prescribed. Participant is following exercise prescription and nutrition guidelines.;Long Term: Cholesterol controlled with medications as prescribed, with individualized exercise RX and with personalized nutrition plan. Value goals: LDL < 70mg60mL > 40 mg.    Stress  Yes    Intervention  Offer individual and/or small group education and counseling on adjustment to heart disease, stress management and health-related lifestyle change. Teach and support self-help strategies.;Refer participants experiencing significant psychosocial distress to appropriate mental health specialists for further evaluation and treatment.  When possible, include family members and significant others in education/counseling sessions.    Expected Outcomes  Short Term: Participant demonstrates changes in health-related behavior, relaxation and other stress management skills, ability to obtain effective social support, and compliance with psychotropic medications if prescribed.;Long Term: Emotional wellbeing is indicated by absence of clinically significant psychosocial distress or social isolation.    Personal Goal Other  Yes     Personal Goal  To improve endurance and breathing and improve cardiac function    Intervention  Will monitor progress in porgram    Expected Outcomes  patient will have an improvement in endurance upon completion of cardiac rehab           Core Components/Risk Factors/Patient Goals Review:  Goals and Risk Factor Review    Core Components/Risk Factors/Patient Goals Review    [place] Name 12/06/17 1523 12/23/17 0735 01/20/18 1144 02/10/18 1203 03/02/18 1748   Personal Goals Review  Improve shortness of breath with ADL's;Hypertension;Lipids;Stress;Other  Improve shortness of breath with ADL's;Hypertension;Lipids;Stress;Other  Improve shortness of breath with ADL's;Hypertension;Lipids;Stress;Other  Improve shortness of breath with ADL's;Hypertension;Lipids;Stress;Other  Improve shortness of breath with ADL's;Hypertension;Lipids;Stress;Other   Review  pt with multiple CAD RF demonstrates eagerness to participate in CR program.pt personal goal to improve cardiac risk stratification, improve circulation and overall heart health.   pt with multiple CAD RF demonstrates eagerness to participate in CR program.pt personal goal to improve cardiac risk stratification, improve circulation and overall heart health. pt exhibits improvement with functional ability by increased workloads at CR and navigating stairs at home with less difficulty.     pt with multiple CAD RF demonstrates eagerness to participate in CR program.pt personal goal to improve cardiac risk stratification, improve circulation and overall heart health. pt reports improved walking ability at home, increased strength/stamina and decreased DOE/     pt with multiple CAD RF demonstrates eagerness to participate in CR program.pt personal goal to improve cardiac risk stratification, improve circulation and overall heart health. pt reports continued improvement in walking ability at home, increased strength/stamina and decreased DOE.  pt with multiple CAD RF  demonstrates eagerness to participate in CR program.pt personal goal to improve cardiac risk stratification, improve circulation and overall heart health. pt recent absences from viral illness.     Expected Outcomes  pt will participate in CR exercise, nutrition and lifestyle modification to decrease overall RF.   pt will participate in CR exercise, nutrition and lifestyle modification to decrease overall RF.   pt will participate in CR exercise, nutrition and lifestyle modification to decrease overall RF.   pt will participate in CR exercise, nutrition and lifestyle modification to decrease overall RF.   pt will participate in CR exercise, nutrition and lifestyle modification to decrease overall RF.           Core Components/Risk Factors/Patient Goals at Discharge (Final Review):  Goals and Risk Factor Review - 03/02/18 1748    Core Components/Risk Factors/Patient Goals Review          Personal Goals Review  Improve shortness of breath with ADL's;Hypertension;Lipids;Stress;Other    Review  pt with multiple CAD RF demonstrates eagerness to participate in CR program.pt personal goal to improve cardiac risk stratification, improve circulation and overall heart health. pt recent absences from viral illness.      Expected Outcomes  pt will participate in CR exercise, nutrition and lifestyle modification to decrease overall RF.            ITP Comments: ITP Comments  [place] Name 11/29/17 1656 12/06/17 1442 12/23/17 0733 02/10/18 1202 03/02/18 1747   ITP Comments  Dr. Fransico Him, Medical Director   pt started group exercise. pt tolerated light activity without difficulty. pt oriented to exercise equipment and safety routine.   30 day ITP review. pt with good attendance and participation.   30 day ITP review. pt with good attendance and participation.   30 day ITP review. pt with good attendance and participation. pt recent absences from viral illness.       Comments:

## 2018-03-04 ENCOUNTER — Encounter (HOSPITAL_COMMUNITY): Payer: Medicare Other

## 2018-03-07 ENCOUNTER — Encounter (HOSPITAL_COMMUNITY): Payer: Medicare Other

## 2018-03-08 ENCOUNTER — Ambulatory Visit (HOSPITAL_COMMUNITY)
Admission: RE | Admit: 2018-03-08 | Discharge: 2018-03-08 | Disposition: A | Discharge status: Home / Self Care | Payer: Medicare Other | Source: Ambulatory Visit | Attending: Cardiology | Admitting: Cardiology

## 2018-03-08 ENCOUNTER — Encounter (HOSPITAL_COMMUNITY): Payer: Self-pay | Admitting: Cardiology

## 2018-03-08 VITALS — BP 114/58 | HR 83 | Wt 150.6 lb

## 2018-03-08 DIAGNOSIS — I491 Atrial premature depolarization: Secondary | ICD-10-CM | POA: Insufficient documentation

## 2018-03-08 DIAGNOSIS — I252 Old myocardial infarction: Secondary | ICD-10-CM | POA: Diagnosis not present

## 2018-03-08 DIAGNOSIS — Z951 Presence of aortocoronary bypass graft: Secondary | ICD-10-CM

## 2018-03-08 DIAGNOSIS — Z79899 Other long term (current) drug therapy: Secondary | ICD-10-CM | POA: Insufficient documentation

## 2018-03-08 DIAGNOSIS — Z8249 Family history of ischemic heart disease and other diseases of the circulatory system: Secondary | ICD-10-CM | POA: Insufficient documentation

## 2018-03-08 DIAGNOSIS — Z8673 Personal history of transient ischemic attack (TIA), and cerebral infarction without residual deficits: Secondary | ICD-10-CM | POA: Insufficient documentation

## 2018-03-08 DIAGNOSIS — I255 Ischemic cardiomyopathy: Secondary | ICD-10-CM | POA: Insufficient documentation

## 2018-03-08 DIAGNOSIS — I110 Hypertensive heart disease with heart failure: Secondary | ICD-10-CM | POA: Insufficient documentation

## 2018-03-08 DIAGNOSIS — Z9889 Other specified postprocedural states: Secondary | ICD-10-CM | POA: Insufficient documentation

## 2018-03-08 DIAGNOSIS — I6523 Occlusion and stenosis of bilateral carotid arteries: Secondary | ICD-10-CM | POA: Insufficient documentation

## 2018-03-08 DIAGNOSIS — I440 Atrioventricular block, first degree: Secondary | ICD-10-CM | POA: Diagnosis not present

## 2018-03-08 DIAGNOSIS — I272 Pulmonary hypertension, unspecified: Secondary | ICD-10-CM

## 2018-03-08 DIAGNOSIS — Z7982 Long term (current) use of aspirin: Secondary | ICD-10-CM | POA: Diagnosis not present

## 2018-03-08 DIAGNOSIS — I5022 Chronic systolic (congestive) heart failure: Secondary | ICD-10-CM | POA: Insufficient documentation

## 2018-03-08 DIAGNOSIS — I080 Rheumatic disorders of both mitral and aortic valves: Secondary | ICD-10-CM | POA: Insufficient documentation

## 2018-03-08 DIAGNOSIS — I4510 Unspecified right bundle-branch block: Secondary | ICD-10-CM | POA: Insufficient documentation

## 2018-03-08 DIAGNOSIS — I493 Ventricular premature depolarization: Secondary | ICD-10-CM | POA: Insufficient documentation

## 2018-03-08 DIAGNOSIS — E785 Hyperlipidemia, unspecified: Secondary | ICD-10-CM | POA: Diagnosis not present

## 2018-03-08 DIAGNOSIS — I2510 Atherosclerotic heart disease of native coronary artery without angina pectoris: Secondary | ICD-10-CM | POA: Insufficient documentation

## 2018-03-08 DIAGNOSIS — G629 Polyneuropathy, unspecified: Secondary | ICD-10-CM | POA: Insufficient documentation

## 2018-03-08 DIAGNOSIS — I480 Paroxysmal atrial fibrillation: Secondary | ICD-10-CM | POA: Diagnosis not present

## 2018-03-08 DIAGNOSIS — G4733 Obstructive sleep apnea (adult) (pediatric): Secondary | ICD-10-CM | POA: Insufficient documentation

## 2018-03-08 DIAGNOSIS — I2721 Secondary pulmonary arterial hypertension: Secondary | ICD-10-CM | POA: Insufficient documentation

## 2018-03-08 NOTE — Patient Instructions (Addendum)
START Opsumit 35m daily  Your physician recommends that you schedule a follow-up appointment in: 6 weeks.

## 2018-03-08 NOTE — Telephone Encounter (Signed)
Pt saw Dr. Rogers Blocker for [place] on 12/9.

## 2018-03-09 ENCOUNTER — Encounter (HOSPITAL_COMMUNITY): Payer: Medicare Other

## 2018-03-09 NOTE — Progress Notes (Signed)
Patient ID: Caleb Moment, PhD, male   DOB: Mar 03, 1941, 77 y.o.   MRN: [id number]    Advanced Heart Failure Clinic Note   PCP: [name] EP: [name] Cardiology: [name]  77 y.o.with complex past history presents for heart failure followup.  Patient had anterior MI in 2004 and developed ischemic cardiomyopathy as well as mitral regurgitation. He also developed atrial fibrillation.  In 10/14, he had MV repair, Maze, CABG with LIMA-LAD, and LA appendage closure at [facility] in [place].  Subsequently, atrial fibrillation returned and he had an atrial fibrillation ablation in [facility] in 3/15.  He wore a Zio patch in 12/15 and had a low atrial fibrillation burden of 7%.  He has had a long-standing ischemic cardiomyopathy.  In 2015, EF was 20-25%.  Echo in 2016 also showed EF 25-30% but estimated PA pressure suggested severe pulmonary hypertension.     At initial appointment, he reported increased exertional dyspnea over a number of weeks.  RHC was done, showing mildly elevated PCWP with moderate pulmonary HTN and low cardiac index (1.93 thermo, 2.13 Fick).  V/Q scan showed no PE.  I started him on digoxin and have titrated his Lasix to 80 mg daily.  He is now on bisoprolol and able to tolerate it.  At last appointment, I had him try replacing valsartan with Entresto 24/26 bid.  He was unable to tolerate it (made him dizzy, though BP was not low).  Therefore, I had him restart valsartan.  CPX in 4/16 showed mildly decreased functional capacity.  He has tolerated Adcirca 20 mg daily.  He did not tolerate an attempt to uptitrate bisoprolol.  He tried CPAP but was unable to tolerate it.  He was unable to tolerate eplerenone 50 mg daily.  Last echo in 9/17 showed EF 20-25%, stable MV repair, normal RV, and PA systolic pressure 86 mmHg.   He had a Lexiscan Cardiolite in 9/17 that showed infarction, no ischemia.  [place] in 10/17 showed severe mixed pulmonary venous HTN/pulmonary arterial HTN with PVR 4.3  WU and PCWP mildly elevated.  Adcirca was increased to 40 mg daily.   At a prior appointment, he was having more palpitations.  Event monitor in 11/17 showed atrial fibrillation episodes, these were symptomatic.  Since then, the atrial fibrillation seems to have decreased.  He saw [name] to discuss Tikosyn initiation.  No decision was reached at that time, and since palpitations have decreased again, he wants to hold off on Tikosyn for now.   He has OSA.  He cannot tolerate CPAP but is using his oral appliance.    HR was lower in 5/18.  He came into the office with HR around 40.  ECG showed an ectopic atrial rhythm with very small P waves. I stopped bisoprolol and later digoxin.  HR has gone up since.  He wore an event monitor in 6/18 showing rare 3-5 second pauses at night only, rare atrial fibrillation, and rare junctional bradycardia.  He wore an event monitor again in 4/19, this showed rare afib (1% total) with nocturnal bradycardia and 1 nocturnal 3 second pause.  No concerning findings.   He stopped Adcirca as his insurance was no longer covering it.    He saw [name], and it was decided that he would be unlikely to improve much with CRT with his current IVCD (not true LBBB).   Echo (7/19) is comparable to the past with mildly dilated LV, EF 25% with wall motion abnormalities,  mild RV dilation/mild decreased function, mild to moderate AI, stable repaired mitral valve, PASP 76 mmHg.     Given worsening symptoms, he had RHC in 8/19.  This showed evidence for volume overload with severe pulmonary arterial hypertension, PVR 5.8 WU and preserved cardiac output. Torsemide was increased.  I wanted him to start on Opsumit 10 mg daily but he has not started it yet.    He returns for followup of CHF. He has had PNA versus viral bronchitis/URI recently with cough and generalized weakness. He seems to be gradually getting over this.  Weight is down 6 lbs.  No chest pain. No palpitations.  He appears  to be in an ectopic atrial rhythm today.    ECG (personally reviewed): ectopic atrial rhythm, RBBB, LAFB  6 minute walk (3/16): 381 m  6 minute walk (5/16): 414.5 m 6 minute walk (10/16): 562 m 6 minute walk (1/17): 469 m 6 minute walk (8/17): 488 m 6 minute walk (6/18): 549 m 6 minute walk (11/18): 366 m 6 minute walk (9/19): 518 m  Labs (2/15): LDL 144 Labs (8/15): K 4.6, creatinine 0.9 Labs (12/15): HCT 42.3   Labs (2/16): K 4 => 4.2, creatinine 1.05 => 0.92, BNP 268 Labs (3/16): BNP 495 => 320, digoxin 0.4, RF 14.7 (very mild increase), TSH normal, HIV negative, anti-SCL70 negative, creatinine 0.91, K 4.4 Labs (7/16): K 5, creatinine 1.05, BNP 455, vitamin D normal, digoxin 0.4, B12 normal Labs (8/16): digoxin 0.8, BNP 305, K 4.2, creatinine 0.99 Labs (9/16): HCT 43.3, TSH normal, BNP 492 => 278, K 4.3, creatinine 0.97 => 1.04, digoxin 0.6, TSH normal, LDL 154, LDL-P 1654.  Labs (10/16): K 4.7, creatinine 1.1 Labs (1/17): pro-BNP 2065, digoxin 0.3, K 4.3, creatinine 1.10 => 1.28 Labs (3/17): K 4.1, creatinine 1.09, HCT 38.3 Labs (4/17): K 4.4, creatinine 0.99, digoxin 0.8 Labs (5/17): K 4.3, creatinine 1.08, BNP 317, hgb 13.1 Labs (8/17): K 4.6, creatinine 0.99, BNP 392, digoxin 0.5 Labs (9/17): K 4.4, creatinine 1.05 Labs (10/17): digoxin 0.5, K 4.1, creatinine 1.12, HCT 44.9, digoxin 0.5 Labs (11/17): K 4 => 3.8, creatinine 1.3 => 1.16, digoxin 0.5, BNP 296 Labs (12/17): K 4.1, creatinine 1.15, BNP 294, digoxin 0.7 Labs (2/18): LDL 135, Lp(a) 124, LDL-P 979  Labs (5/18): K 4.3, creatinine 1.07 => 0.95, BNP 224, hgb 14.4, digoxin 0.6 Labs (6/18): K 4.4, creatinine 1.08, hgb 14 Labs (8/18): K 4.1, creatinine 1.05 Labs (12/18): K 3.7, creatinine 0.94 Labs (2/19): K 4.1, creatinine 0.99, BNP 255 Labs (5/19): K 4, creatinine 0.98, LDL 140 Labs (7/19): LDL 136 Labs (9/19): K 4.1, creatinine 0.91 Labs (12/19): K 3.6, creatinine 1.18, BNP 479, hgb 12.9  PMH: 1. CAD: Anterior  MI in 2004.  Cardiac surgery in 10/14 included LIMA-LAD.  - Lexiscan Cardiolite (9/17): EF 35%, infarct present with no ischemia.  2. Chronic mitral regurgitation: 10/14 surgery at [facility] with MV repair, Maze, LIMA-LAD, and LA appendage closure.  3. Atrial fibrillation: Paroxysmal.  He was initially on Tikosyn but had breakthrough atrial fibrillation.  H/o Maze in 2014.  Had recurrent atrial fibrillation with ablation in 3/15 by Dr Ola Spurr in [facility].  Not anticoagulated after 2 severe prostate bleeding episodes.  LA appendage was oversewn with MV surgery.  - Zio patch in 12/15 with low atrial fibrillation burden (7%).  - Holter (9/16) with PACs, PVCs, short atrial fibrillation runs (nothing sustained). - Event monitor (11/17) with runs of atrial fibrillation and flutter.   - Event monitor (  6/18) with rare atrial fibrillation - Event monitor 4/19 showing rare afib (1% total) with nocturnal bradycardia and 1 nocturnal 3 second pause.  No concerning findings.  4. Chronotropic incompetence.  5. HTN 6. Hyperlipidemia: Refuses statin.  7. Peripheral neuropathy 8. GERD 9. H/o BPPV 10. H/o TIA 11. Ischemic cardiomyopathy: cardiac MRI 7/14 with EF 35%, moderate MR, normal RV size and systolic function, extensive anterior and anteroseptal LGE suggestive of non-viable myocardium (this was prior to LIMA-LAD).  Echo 1/15 with EF 20-25%, moderate AI, PA systolic pressure 39 mmHg. Echo (1/16) with EF 20-25%, diffuse hypokinesis with regionality, moderate LV dilation, moderate AI, s/p MV repair with mild MR and normal gradients, RV dilated with mildly decreased systolic function, PA systolic pressure 71 mmHg.   - RHC (2/16) with mean RA 9, PA 61/25 mean 40, mean PCWP 23, CI 2.13/PVR 4.3 (Fick), CI 1.93/PVR 4.7 (thermo).   - CPX (4/16) with peak VO2 17.9, VE/VCO2 34.7 => mildly decreased functional capacity.  - Spironolactone apparently caused cognitive deficits - Intolerant of Coreg due to  development of severe alopecia - Unable to uptitrate bisoprolol due to intolerance.  - Lightheaded with Entresto.  - Unable to tolerate increase in valsartan to 80 mg bid.  - Echo (1/17) with EF 30-35%, moderate LV dilation, mild AI, s/p MV repair with mild MR, moderately dilated RV with mildly decreased systolic function, PA systolic pressure 69 mmHg.  - CPX (4/17): peak VO2 18, VE/VCO2 slope 33, RER 1.28 => mild to moderate functional impairment, mildly improved.  - Echo (9/17): EF 20-25% with regional WMAs, normal RV size and systolic function, PASP 86 mmHg, stable repaired mitral valve with mild MR, moderate AI.  - RHC (10/17): mean RA 9, PA 70/23 mean 43, PCWP mean 20, CI 2.96 Fick/2.84 Thermo, PVR 4.3 WU.  - Echo (9/18): severe LV dilation, EF 20-25% with WMAs, s/p MV repair with mild MR, moderate AI, moderate TR, PASP 68 mmHg, normal RV size and systolic function.  - Echo (7/19): mildly dilated LV, EF 25% with wall motion abnormalities, mild RV dilation/mild decreased function, mild to moderate AI, stable repaired mitral valve (no MS, mild MR), PASP 76 mmHg.   - CPX (8/19): peak VO2 18.9, VE/VCO2 slope 40, RER 1.09 => moderate HF limitation.  - RHC (8/19): mean RA 14, PA 75/28 mean 47, mean PCWP 24, CI 2.44/PVR 5 Fick, CI 2.13/PVR 5.8 thermo.  12. Carotid stenosis: Carotid dopplers (1/16) with 40-59% bilateral ICA stenosis. Carotid dopplers (4/17) with 40-59% BICA stenosis.  - Carotid dopplers (2/18) with < 50% BICA stenosis.  13. Aortic insufficiency: Moderate by last echo 9/17.  14. Pulmonary HTN: Mixed PAH and pulmonary venous hypertension.  PFTs (9/14) were normal.  V/Q scan (2/16) with no evidence of acute or chronic PE.   15. OSA: Moderate on 5/16 sleep study. Unable to tolerate CPAP.  Repeat sleep study at [facility] in 2/18 was also suggestive of OSA.  16. Venous insufficiency 17. Ventral hernia 18. Bradycardia: Holter (5/18) with overnight pauses up to 4.7 sec (likely due to OSA),  occasional short atrial tachycardia runs, no atrial fibrillation, avg HR 70s, 3% PVCs.  - Event monitor (6/18): Rare 3-4 second pauses at night, rare atrial fibrillation, rare runs of junctional bradycardia.   SH: Married, lives in [place], Engineer, water, nonsmoker  FH: CAD  ROS: All systems reviewed and negative except as per HPI.   Current Outpatient Medications  Medication Sig Dispense Refill  . aspirin EC 81 MG tablet Take  1 tablet (81 mg total) by mouth daily. 30 tablet 3  . eplerenone (INSPRA) 25 MG tablet Take 1 tablet (25 mg total) by mouth daily. 30 tablet 11  . magnesium gluconate (MAGONATE) 500 MG tablet Take 500 mg by mouth 2 (two) times daily.     . Omega-3 Fatty Acids (FISH OIL PO) Take 360 mg by mouth daily.     . potassium chloride (K-DUR,KLOR-CON) 10 MEQ tablet Take 2 tablets (20 mEq total) by mouth daily. 60 tablet 3  . torsemide (DEMADEX) 10 MG tablet Take 4 tablets (40 mg total) by mouth daily. May also take 1 tablet (10 mg total) as needed (sob/edema). 150 tablet 1  . valsartan (DIOVAN) 40 MG tablet TAKE 1 AND 1/2 TABLETS BY MOUTH IN THE MORNING AND 2 TABLETS IN THE EVENING (Patient taking differently: Take 40 mg by mouth 2 (two) times daily. ) 105 tablet 3   No current facility-administered medications for this encounter.    BP (!) 114/58   Pulse 83   Wt 68.3 kg (150 lb 9.6 oz)   SpO2 96%   BMI 22.24 kg/m    Wt Readings from Last 3 Encounters:  03/08/18 68.3 kg (150 lb 9.6 oz)  02/28/18 69.7 kg (153 lb 9.6 oz)  02/24/18 73.5 kg (162 lb)    General: NAD Neck: No JVD, no thyromegaly or thyroid nodule.  Lungs: Clear to auscultation bilaterally with normal respiratory effort. CV: Nondisplaced PMI.  Heart regular S1/S2, no S3/S4, 2/6 SEM RUSB with preserved S2.  Trace ankle edema.  No carotid bruit.  Normal pedal pulses.  Abdomen: Soft, nontender, no hepatosplenomegaly, no distention.  Skin: Intact without lesions or rashes.  Neurologic: Alert and oriented x  3.  Psych: Normal affect. Extremities: No clubbing or cyanosis.  HEENT: Normal.   Assessment/Plan: 1. CAD: S/p LIMA-LAD.  He has decided not to take statins after reviewing the data (I did recommend taking a statin but we have agreed to disagree on this, he is taking red yeast rice extract).  Lexiscan Cardiolite was done in 9/17 due to atypical chest pain.  This showed prior infarction with no ischemia.  No recurrence of chest pain since that time despite ongoing exercise.   - Continue ASA 81 daily.  2. S/p mitral valve repair: The MV repair looked stable on 7/19 echo with no evidence for significant mitral stenosis and mild MR.  3. Aortic insufficiency: Moderate on 9/18 echo.  Echo in 7/19 with mild-moderate AI.  4. Chronic systolic CHF: Ischemic cardiomyopathy, EF 25% on 7/19 echo, stable.  PA pressure remained elevated by doppler measurement with mildly decreased RV systolic function.  Most recent CPX in 8/19 with moderate HF limitation, mildly worse than prior.  He was volume overloaded on 8/19 RHC with severe pulmonary hypertension but preserved cardiac output.  He is actually doing better symptomatically, NYHA class II symptoms. He is not significantly volume overloaded on eam.  - Continue torsemide 50 mg daily, BMET today.  - Bisoprolol and digoxin were both stopped with bradycardia and lightheadedness.   - He did not tolerate Entresto.  Continue valsartan 40 mg bid, he has not tolerated uptitration.   - Continue eplerenone 25 (unable to tolerate increase).        - He has an IVCD 130 msec.  Seen by [name], not thought that he would benefit appreciably from CRT as not true LBBB and not markedly wide.  - Continue cardiac rehab.  5. Atrial fibrillation: s/p Maze  in 10/14, then ablation in 3/15.  Prior to Maze, he was on Tikosyn but had breakthrough.  Currently, he is not anticoagulated due to history of prostate bleeding and his choice.  He did have his LA appendage oversewn at time of MV  surgery.   1% atrial fibrillation on 4/19 event monitor. He is in an ectopic atrial rhythm today with reasonable rate .  - Off nodal blockers with bradycardia.    - We have had discussions about what to do with his atrial fibrillation.  He is symptomatic at times when in atrial fibrillation/fluttter. He had breakthrough on Tikosyn in the past, but has had Maze and atrial fibrillation ablation since that time. He opted to hold off on trying Tikosyn again and currently feels like the atrial fibrillation burden is manageable.  6. Pulmonary hypertension: Severe by echo in 9/17 and on RHC in 10/17.  RV actually looked ok on 9/17 echo.  Mixed pulmonary venous and pulmonary arterial HTN on RHC.  It is possible that the [facility] component is due to pulmonary vascular remodeling in the setting of chronic mitral regurgitation prior to MV repair. Negative V/Q scan, no evidence for CTEPH.  PFTs normal in 9/14.  He is seeing Dr Lake Bells.  Sleep study showed moderate OSA but he has been unable to tolerate CPAP and is now using an oral device. PASP 76 mmHg by echo in 7/19.  He tried Engineer, site but stopped it so that he could take a nitric oxide supplement.  I repeated RHC in 8/19.  This showed severe mixed pulmonary venous/pulmonary arterial HTN with PVR 5.8 WU and preserved cardiac output. - He wants to continue to take nitric oxide-active herbal and wants to avoid Adcirca => interestingly, his symptoms have improved considerably in the last few weeks on this supplement. - He has agreed to start macitentan 10 mg daily. I will have our HF pharmacist work with him to get this medication.   - Repeat 6 minute walk at next appt when he is on macitentan.  7. OSA: Moderate.  Has not tolerated CPAP. Probably plays a role in recurrent atrial fibrillation and pulmonary hypertension as well as nocturnal sinus pauses.  He has been using his oral device. 8. Carotid stenosis: 2/18 carotid dopplers at [facility] with < 50% bilateral stenosis.     9. Bradycardia: Nocturnal bradycardia likely related to OSA.  Now off digoxin and bisoprolol.   Followup in 6 wks.   Loralie Champagne 03/09/2018

## 2018-03-11 MED FILL — TORSEMIDE 10 MG TABLET: 10 | 30 days supply | Qty: 150 | Fill #0

## 2018-03-21 ENCOUNTER — Telehealth (HOSPITAL_COMMUNITY): Payer: Self-pay | Admitting: Cardiac Rehabilitation

## 2018-03-21 DIAGNOSIS — I509 Heart failure, unspecified: Secondary | ICD-10-CM | POA: Diagnosis not present

## 2018-03-21 DIAGNOSIS — R05 Cough: Secondary | ICD-10-CM | POA: Diagnosis not present

## 2018-03-21 DIAGNOSIS — I272 Pulmonary hypertension, unspecified: Secondary | ICD-10-CM | POA: Diagnosis not present

## 2018-03-21 DIAGNOSIS — J383 Other diseases of vocal cords: Secondary | ICD-10-CM | POA: Diagnosis not present

## 2018-03-21 NOTE — Addendum Note (Signed)
Encounter addended by: Lowell Guitar, RN on: 03/21/2018 4:54 PM  Actions taken: Visit Navigator Flowsheet section accepted

## 2018-03-21 NOTE — Telephone Encounter (Signed)
pc to pt to discuss discharging from CR program.  Pt informed will be discharged from program. Will need new MD order to reschedule. Pt states he feels too weak and deconditioned to participate in program at this time. Pt would like to reschedule participation at later time.  Pt informed of need for new MD order to reschedule.  He would like to discuss with [name].  Understanding verbalized. Andi Hence, RN, BSN Cardiac Pulmonary Rehab

## 2018-03-25 NOTE — Addendum Note (Signed)
Encounter addended by: Ivonne Andrew, RD on: 03/25/2018 9:49 AM  Actions taken: Flowsheet data copied forward, Visit Navigator Flowsheet section accepted

## 2018-03-25 NOTE — Addendum Note (Signed)
Encounter addended by: Ivonne Andrew, RD on: 03/25/2018 8:29 AM  Actions taken: Flowsheet data copied forward, Visit Navigator Flowsheet section accepted

## 2018-03-28 ENCOUNTER — Other Ambulatory Visit (HOSPITAL_COMMUNITY): Payer: Self-pay | Admitting: Cardiology

## 2018-03-29 MED FILL — EPLERENONE 25 MG TABLET: 25 | 30 days supply | Qty: 30 | Fill #0

## 2018-03-29 NOTE — Addendum Note (Signed)
Encounter addended by: Lowell Guitar, RN on: 03/29/2018 2:39 PM  Actions taken: Visit Navigator Flowsheet section accepted

## 2018-04-13 ENCOUNTER — Telehealth: Payer: Self-pay | Admitting: Pulmonary Disease

## 2018-04-13 NOTE — Telephone Encounter (Signed)
Called and spoke with pt. Pt stated to me he was prescribed Opsumit by [name] cardiologist and stated the med had some side effects he wanted to discuss. I stated to pt he needed to call Dr. Claris Gladden office to discuss the concerns about the side effects with the med since he is the one who prescribed the med to him. Pt expressed understanding. Nothing further needed.

## 2018-04-14 ENCOUNTER — Ambulatory Visit: Payer: Medicare Other | Admitting: Pulmonary Disease

## 2018-04-14 MED FILL — TORSEMIDE 10 MG TABLET: 10 | 30 days supply | Qty: 150 | Fill #1

## 2018-04-26 ENCOUNTER — Ambulatory Visit (HOSPITAL_COMMUNITY)
Admission: RE | Admit: 2018-04-26 | Discharge: 2018-04-26 | Disposition: A | Discharge status: Home / Self Care | Payer: Medicare Other | Source: Ambulatory Visit | Attending: Cardiology | Admitting: Cardiology

## 2018-04-26 ENCOUNTER — Encounter (HOSPITAL_COMMUNITY): Payer: Self-pay | Admitting: Cardiology

## 2018-04-26 ENCOUNTER — Other Ambulatory Visit (HOSPITAL_COMMUNITY): Payer: Self-pay

## 2018-04-26 VITALS — BP 118/68 | HR 71 | Wt 151.8 lb

## 2018-04-26 DIAGNOSIS — G629 Polyneuropathy, unspecified: Secondary | ICD-10-CM | POA: Insufficient documentation

## 2018-04-26 DIAGNOSIS — I6523 Occlusion and stenosis of bilateral carotid arteries: Secondary | ICD-10-CM | POA: Diagnosis not present

## 2018-04-26 DIAGNOSIS — I5022 Chronic systolic (congestive) heart failure: Secondary | ICD-10-CM | POA: Diagnosis not present

## 2018-04-26 DIAGNOSIS — Z79899 Other long term (current) drug therapy: Secondary | ICD-10-CM | POA: Insufficient documentation

## 2018-04-26 DIAGNOSIS — I252 Old myocardial infarction: Secondary | ICD-10-CM | POA: Insufficient documentation

## 2018-04-26 DIAGNOSIS — Z8249 Family history of ischemic heart disease and other diseases of the circulatory system: Secondary | ICD-10-CM | POA: Diagnosis not present

## 2018-04-26 DIAGNOSIS — I110 Hypertensive heart disease with heart failure: Secondary | ICD-10-CM | POA: Diagnosis not present

## 2018-04-26 DIAGNOSIS — I872 Venous insufficiency (chronic) (peripheral): Secondary | ICD-10-CM | POA: Insufficient documentation

## 2018-04-26 DIAGNOSIS — E785 Hyperlipidemia, unspecified: Secondary | ICD-10-CM | POA: Insufficient documentation

## 2018-04-26 DIAGNOSIS — Z951 Presence of aortocoronary bypass graft: Secondary | ICD-10-CM | POA: Diagnosis not present

## 2018-04-26 DIAGNOSIS — G4733 Obstructive sleep apnea (adult) (pediatric): Secondary | ICD-10-CM | POA: Insufficient documentation

## 2018-04-26 DIAGNOSIS — I272 Pulmonary hypertension, unspecified: Secondary | ICD-10-CM | POA: Diagnosis not present

## 2018-04-26 DIAGNOSIS — I2510 Atherosclerotic heart disease of native coronary artery without angina pectoris: Secondary | ICD-10-CM | POA: Diagnosis not present

## 2018-04-26 DIAGNOSIS — R001 Bradycardia, unspecified: Secondary | ICD-10-CM | POA: Diagnosis not present

## 2018-04-26 DIAGNOSIS — I080 Rheumatic disorders of both mitral and aortic valves: Secondary | ICD-10-CM | POA: Diagnosis not present

## 2018-04-26 DIAGNOSIS — I509 Heart failure, unspecified: Secondary | ICD-10-CM | POA: Diagnosis present

## 2018-04-26 DIAGNOSIS — Z8673 Personal history of transient ischemic attack (TIA), and cerebral infarction without residual deficits: Secondary | ICD-10-CM | POA: Insufficient documentation

## 2018-04-26 DIAGNOSIS — Z7982 Long term (current) use of aspirin: Secondary | ICD-10-CM | POA: Insufficient documentation

## 2018-04-26 DIAGNOSIS — Z9889 Other specified postprocedural states: Secondary | ICD-10-CM | POA: Diagnosis not present

## 2018-04-26 DIAGNOSIS — I480 Paroxysmal atrial fibrillation: Secondary | ICD-10-CM | POA: Diagnosis not present

## 2018-04-26 DIAGNOSIS — I255 Ischemic cardiomyopathy: Secondary | ICD-10-CM | POA: Diagnosis not present

## 2018-04-26 LAB — CBC
HCT: 40.9 % (ref 39.0–52.0)
Hemoglobin: 13 g/dL (ref 13.0–17.0)
MCH: 29.4 pg (ref 26.0–34.0)
MCHC: 31.8 g/dL (ref 30.0–36.0)
MCV: 92.5 fL (ref 80.0–100.0)
Platelets: 184 10*3/uL (ref 150–400)
RBC: 4.42 MIL/uL (ref 4.22–5.81)
RDW: 15.4 % (ref 11.5–15.5)
WBC: 6.3 10*3/uL (ref 4.0–10.5)
nRBC: 0 % (ref 0.0–0.2)

## 2018-04-26 LAB — BASIC METABOLIC PANEL
Anion gap: 10 (ref 5–15)
BUN: 25 mg/dL — AB (ref 8–23)
CO2: 28 mmol/L (ref 22–32)
CREATININE: 0.97 mg/dL (ref 0.61–1.24)
Calcium: 9.1 mg/dL (ref 8.9–10.3)
Chloride: 98 mmol/L (ref 98–111)
GFR calc Af Amer: >60 mL/min (ref >60)
GFR calc non Af Amer: >60 mL/min (ref >60)
Glucose, Bld: 120 mg/dL — ABNORMAL HIGH (ref 70–99)
Potassium: 3.8 mmol/L (ref 3.5–5.1)
SODIUM: 136 mmol/L (ref 135–145)

## 2018-04-26 LAB — BRAIN NATRIURETIC PEPTIDE: B NATRIURETIC PEPTIDE 5: 371.2 pg/mL — AB (ref 0.0–100.0)

## 2018-04-26 MED ORDER — MACITENTAN 10 MG PO TABS: 10.0000 mg | ORAL_TABLET | Freq: Every day | ORAL | 11 refills | Status: DC

## 2018-04-26 MED ORDER — TORSEMIDE 20 MG PO TABS: 60.0000 mg | ORAL_TABLET | Freq: Every day | ORAL | 6 refills | Status: DC

## 2018-04-26 MED FILL — TORSEMIDE 20 MG TABLET: 20 | 30 days supply | Qty: 90 | Fill #0

## 2018-04-26 MED FILL — EPLERENONE 25 MG TABLET: 25 | 30 days supply | Qty: 30 | Fill #1

## 2018-04-26 NOTE — Progress Notes (Signed)
CSW consulted to assist pt with Opsumit copay concerns.  Pt reports that his PAN foundation is going to get canceled as of 05/10/18 due to inactivity.  Pt states that he was prescribed Opsumit back in October of last year but couldn't start it due to illness and now [name] would like him to start it again but he can't afford it without PAN foundation.  CSW explained that all he would need to due would be to have PAN billed before 05/10/18 to prove that he was still active- pt will need specialty pharmacy to fill perscription- last year had been referred to [place] [facility] but it was never finalized since pt didn't start medication at that time.  CSW called [facility] [phone]) and confirmed they will need a new script and PA faxed to [facility] for processing (fax: [phone]) CSW also faxed PA to [facility] for their records [phone]).  Once the new information is processed they will call pt to inform of Opsumit copay at which time pt can provide with PAN grant information. Pt updated.  CSW will continue to follow and assist as needed  Jorge Ny, LCSW Clinical Social Worker [facility] [phone]

## 2018-04-26 NOTE — Telephone Encounter (Signed)
PA submitted for Opsumit 10m via CMM.

## 2018-04-26 NOTE — Patient Instructions (Signed)
Keep Torsemide at 67m daily  Labs today We will only contact you if something comes back abnormal or we need to make some changes. Otherwise no news is good news!  Your physician recommends that you schedule a follow-up appointment in: 6 weeks with Dr. MAundra Dubin

## 2018-04-27 ENCOUNTER — Other Ambulatory Visit (HOSPITAL_COMMUNITY): Payer: Self-pay

## 2018-04-27 ENCOUNTER — Telehealth (HOSPITAL_COMMUNITY): Payer: Self-pay

## 2018-04-27 NOTE — Telephone Encounter (Signed)
Patient and wife aware, they will receive a call to arrange an appointment for cardiac rehab

## 2018-04-27 NOTE — Telephone Encounter (Signed)
-----  Message from Larey Dresser, MD sent at 04/27/2018 12:37 AM EST ----- Please order for [name] to restart cardiac rehab.

## 2018-04-27 NOTE — Progress Notes (Signed)
Patient ID: Caleb Moment, PhD, male   DOB: Mar 03, 1941, 78 y.o.   MRN: [id number]    Advanced Heart Failure Clinic Note   PCP: [name] EP: [name] Cardiology: [name]  78 y.o.with complex past history presents for heart failure followup.  Patient had anterior MI in 2004 and developed ischemic cardiomyopathy as well as mitral regurgitation. He also developed atrial fibrillation.  In 10/14, he had MV repair, Maze, CABG with LIMA-LAD, and LA appendage closure at [facility] in [place].  Subsequently, atrial fibrillation returned and he had an atrial fibrillation ablation in [facility] in 3/15.  He wore a Zio patch in 12/15 and had a low atrial fibrillation burden of 7%.  He has had a long-standing ischemic cardiomyopathy.  In 2015, EF was 20-25%.  Echo in 2016 also showed EF 25-30% but estimated PA pressure suggested severe pulmonary hypertension.     At initial appointment, he reported increased exertional dyspnea over a number of weeks.  RHC was done, showing mildly elevated PCWP with moderate pulmonary HTN and low cardiac index (1.93 thermo, 2.13 Fick).  V/Q scan showed no PE.  I started him on digoxin and have titrated his Lasix to 80 mg daily.  He is now on bisoprolol and able to tolerate it.  At last appointment, I had him try replacing valsartan with Entresto 24/26 bid.  He was unable to tolerate it (made him dizzy, though BP was not low).  Therefore, I had him restart valsartan.  CPX in 4/16 showed mildly decreased functional capacity.  He has tolerated Adcirca 20 mg daily.  He did not tolerate an attempt to uptitrate bisoprolol.  He tried CPAP but was unable to tolerate it.  He was unable to tolerate eplerenone 50 mg daily.  Last echo in 9/17 showed EF 20-25%, stable MV repair, normal RV, and PA systolic pressure 86 mmHg.   He had a Lexiscan Cardiolite in 9/17 that showed infarction, no ischemia.  [place] in 10/17 showed severe mixed pulmonary venous HTN/pulmonary arterial HTN with PVR 4.3  WU and PCWP mildly elevated.  Adcirca was increased to 40 mg daily.   At a prior appointment, he was having more palpitations.  Event monitor in 11/17 showed atrial fibrillation episodes, these were symptomatic.  Since then, the atrial fibrillation seems to have decreased.  He saw [name] to discuss Tikosyn initiation.  No decision was reached at that time, and since palpitations have decreased again, he wants to hold off on Tikosyn for now.   He has OSA.  He cannot tolerate CPAP but is using his oral appliance.    HR was lower in 5/18.  He came into the office with HR around 40.  ECG showed an ectopic atrial rhythm with very small P waves. I stopped bisoprolol and later digoxin.  HR has gone up since.  He wore an event monitor in 6/18 showing rare 3-5 second pauses at night only, rare atrial fibrillation, and rare junctional bradycardia.  He wore an event monitor again in 4/19, this showed rare afib (1% total) with nocturnal bradycardia and 1 nocturnal 3 second pause.  No concerning findings.   He stopped Adcirca as his insurance was no longer covering it.    He saw [name], and it was decided that he would be unlikely to improve much with CRT with his current IVCD (not true LBBB).   Echo (7/19) is comparable to the past with mildly dilated LV, EF 25% with wall motion abnormalities,  mild RV dilation/mild decreased function, mild to moderate AI, stable repaired mitral valve, PASP 76 mmHg.     Given worsening symptoms, he had RHC in 8/19.  This showed evidence for volume overload with severe pulmonary arterial hypertension, PVR 5.8 WU and preserved cardiac output. Torsemide was increased.    He returns for followup of CHF. Still recovering for series of URIs over the last couple of months.  He started Opsumit this week. He has been taking torsemide 60 mg daily recently and weight is stable.  Occasional palpitations, not in atrial fibrillation today.  Poor energy level since he has had URIs but  starting to do better.  Not exercising much. He can walk 10-15 minutes without dyspnea.  Mild dyspnea walking fast up a flight of stairs.    ECG (personally reviewed): ectopic atrial rhythm, LVH  6 minute walk (3/16): 381 m  6 minute walk (5/16): 414.5 m 6 minute walk (10/16): 562 m 6 minute walk (1/17): 469 m 6 minute walk (8/17): 488 m 6 minute walk (6/18): 549 m 6 minute walk (11/18): 366 m 6 minute walk (9/19): 518 m  Labs (2/15): LDL 144 Labs (8/15): K 4.6, creatinine 0.9 Labs (12/15): HCT 42.3   Labs (2/16): K 4 => 4.2, creatinine 1.05 => 0.92, BNP 268 Labs (3/16): BNP 495 => 320, digoxin 0.4, RF 14.7 (very mild increase), TSH normal, HIV negative, anti-SCL70 negative, creatinine 0.91, K 4.4 Labs (7/16): K 5, creatinine 1.05, BNP 455, vitamin D normal, digoxin 0.4, B12 normal Labs (8/16): digoxin 0.8, BNP 305, K 4.2, creatinine 0.99 Labs (9/16): HCT 43.3, TSH normal, BNP 492 => 278, K 4.3, creatinine 0.97 => 1.04, digoxin 0.6, TSH normal, LDL 154, LDL-P 1654.  Labs (10/16): K 4.7, creatinine 1.1 Labs (1/17): pro-BNP 2065, digoxin 0.3, K 4.3, creatinine 1.10 => 1.28 Labs (3/17): K 4.1, creatinine 1.09, HCT 38.3 Labs (4/17): K 4.4, creatinine 0.99, digoxin 0.8 Labs (5/17): K 4.3, creatinine 1.08, BNP 317, hgb 13.1 Labs (8/17): K 4.6, creatinine 0.99, BNP 392, digoxin 0.5 Labs (9/17): K 4.4, creatinine 1.05 Labs (10/17): digoxin 0.5, K 4.1, creatinine 1.12, HCT 44.9, digoxin 0.5 Labs (11/17): K 4 => 3.8, creatinine 1.3 => 1.16, digoxin 0.5, BNP 296 Labs (12/17): K 4.1, creatinine 1.15, BNP 294, digoxin 0.7 Labs (2/18): LDL 135, Lp(a) 124, LDL-P 979  Labs (5/18): K 4.3, creatinine 1.07 => 0.95, BNP 224, hgb 14.4, digoxin 0.6 Labs (6/18): K 4.4, creatinine 1.08, hgb 14 Labs (8/18): K 4.1, creatinine 1.05 Labs (12/18): K 3.7, creatinine 0.94 Labs (2/19): K 4.1, creatinine 0.99, BNP 255 Labs (5/19): K 4, creatinine 0.98, LDL 140 Labs (7/19): LDL 136 Labs (9/19): K 4.1,  creatinine 0.91 Labs (12/19): K 3.6, creatinine 1.18, BNP 479, hgb 12.9  PMH: 1. CAD: Anterior MI in 2004.  Cardiac surgery in 10/14 included LIMA-LAD.  - Lexiscan Cardiolite (9/17): EF 35%, infarct present with no ischemia.  2. Chronic mitral regurgitation: 10/14 surgery at [facility] with MV repair, Maze, LIMA-LAD, and LA appendage closure.  3. Atrial fibrillation: Paroxysmal.  He was initially on Tikosyn but had breakthrough atrial fibrillation.  H/o Maze in 2014.  Had recurrent atrial fibrillation with ablation in 3/15 by Dr Ola Spurr in [facility].  Not anticoagulated after 2 severe prostate bleeding episodes.  LA appendage was oversewn with MV surgery.  - Zio patch in 12/15 with low atrial fibrillation burden (7%).  - Holter (9/16) with PACs, PVCs, short atrial fibrillation runs (nothing sustained). - Event monitor (11/17) with  runs of atrial fibrillation and flutter.   - Event monitor (6/18) with rare atrial fibrillation - Event monitor 4/19 showing rare afib (1% total) with nocturnal bradycardia and 1 nocturnal 3 second pause.  No concerning findings.  4. Chronotropic incompetence.  5. HTN 6. Hyperlipidemia: Refuses statin.  7. Peripheral neuropathy 8. GERD 9. H/o BPPV 10. H/o TIA 11. Ischemic cardiomyopathy: cardiac MRI 7/14 with EF 35%, moderate MR, normal RV size and systolic function, extensive anterior and anteroseptal LGE suggestive of non-viable myocardium (this was prior to LIMA-LAD).  Echo 1/15 with EF 20-25%, moderate AI, PA systolic pressure 39 mmHg. Echo (1/16) with EF 20-25%, diffuse hypokinesis with regionality, moderate LV dilation, moderate AI, s/p MV repair with mild MR and normal gradients, RV dilated with mildly decreased systolic function, PA systolic pressure 71 mmHg.   - RHC (2/16) with mean RA 9, PA 61/25 mean 40, mean PCWP 23, CI 2.13/PVR 4.3 (Fick), CI 1.93/PVR 4.7 (thermo).   - CPX (4/16) with peak VO2 17.9, VE/VCO2 34.7 => mildly decreased functional  capacity.  - Spironolactone apparently caused cognitive deficits - Intolerant of Coreg due to development of severe alopecia - Unable to uptitrate bisoprolol due to intolerance.  - Lightheaded with Entresto.  - Unable to tolerate increase in valsartan to 80 mg bid.  - Echo (1/17) with EF 30-35%, moderate LV dilation, mild AI, s/p MV repair with mild MR, moderately dilated RV with mildly decreased systolic function, PA systolic pressure 69 mmHg.  - CPX (4/17): peak VO2 18, VE/VCO2 slope 33, RER 1.28 => mild to moderate functional impairment, mildly improved.  - Echo (9/17): EF 20-25% with regional WMAs, normal RV size and systolic function, PASP 86 mmHg, stable repaired mitral valve with mild MR, moderate AI.  - RHC (10/17): mean RA 9, PA 70/23 mean 43, PCWP mean 20, CI 2.96 Fick/2.84 Thermo, PVR 4.3 WU.  - Echo (9/18): severe LV dilation, EF 20-25% with WMAs, s/p MV repair with mild MR, moderate AI, moderate TR, PASP 68 mmHg, normal RV size and systolic function.  - Echo (7/19): mildly dilated LV, EF 25% with wall motion abnormalities, mild RV dilation/mild decreased function, mild to moderate AI, stable repaired mitral valve (no MS, mild MR), PASP 76 mmHg.   - CPX (8/19): peak VO2 18.9, VE/VCO2 slope 40, RER 1.09 => moderate HF limitation.  - RHC (8/19): mean RA 14, PA 75/28 mean 47, mean PCWP 24, CI 2.44/PVR 5 Fick, CI 2.13/PVR 5.8 thermo.  12. Carotid stenosis: Carotid dopplers (1/16) with 40-59% bilateral ICA stenosis. Carotid dopplers (4/17) with 40-59% BICA stenosis.  - Carotid dopplers (2/18) with < 50% BICA stenosis.  13. Aortic insufficiency: Moderate by last echo 9/17.  14. Pulmonary HTN: Mixed PAH and pulmonary venous hypertension.  PFTs (9/14) were normal.  V/Q scan (2/16) with no evidence of acute or chronic PE.   15. OSA: Moderate on 5/16 sleep study. Unable to tolerate CPAP.  Repeat sleep study at [facility] in 2/18 was also suggestive of OSA.  16. Venous insufficiency 17. Ventral  hernia 18. Bradycardia: Holter (5/18) with overnight pauses up to 4.7 sec (likely due to OSA), occasional short atrial tachycardia runs, no atrial fibrillation, avg HR 70s, 3% PVCs.  - Event monitor (6/18): Rare 3-4 second pauses at night, rare atrial fibrillation, rare runs of junctional bradycardia.   SH: Married, lives in [place], Engineer, water, nonsmoker  FH: CAD  ROS: All systems reviewed and negative except as per HPI.   Current Outpatient Medications  Medication  Sig Dispense Refill  . aspirin EC 81 MG tablet Take 1 tablet (81 mg total) by mouth daily. 30 tablet 3  . eplerenone (INSPRA) 25 MG tablet TAKE 1 TABLET BY MOUTH ONCE DAILY 30 tablet 11  . magnesium gluconate (MAGONATE) 500 MG tablet Take 500 mg by mouth 2 (two) times daily.     . Omega-3 Fatty Acids (FISH OIL PO) Take 360 mg by mouth daily.     . potassium chloride (K-DUR,KLOR-CON) 10 MEQ tablet Take 2 tablets (20 mEq total) by mouth daily. 60 tablet 3  . torsemide (DEMADEX) 20 MG tablet Take 3 tablets (60 mg total) by mouth daily. 90 tablet 6  . valsartan (DIOVAN) 40 MG tablet TAKE 1 AND 1/2 TABLETS BY MOUTH IN THE MORNING AND 2 TABLETS IN THE EVENING (Patient taking differently: Take 40 mg by mouth 2 (two) times daily. ) 105 tablet 3  . macitentan (OPSUMIT) 10 MG tablet Take 1 tablet (10 mg total) by mouth daily. 30 tablet 11   No current facility-administered medications for this encounter.    BP 118/68   Pulse 71   Wt 68.9 kg (151 lb 12.8 oz)   SpO2 98%   BMI 22.42 kg/m    Wt Readings from Last 3 Encounters:  04/26/18 68.9 kg (151 lb 12.8 oz)  03/08/18 68.3 kg (150 lb 9.6 oz)  02/28/18 69.7 kg (153 lb 9.6 oz)    General: NAD Neck: No JVD, no thyromegaly or thyroid nodule.  Lungs: Clear to auscultation bilaterally with normal respiratory effort. CV: Nondisplaced PMI.  Heart regular S1/S2, no S3/S4, 1/6 SEM RUSB.  1+ ankle edema.  No carotid bruit.  Normal pedal pulses.  Abdomen: Soft, nontender, no  hepatosplenomegaly, no distention.  Skin: Intact without lesions or rashes.  Neurologic: Alert and oriented x 3.  Psych: Normal affect. Extremities: No clubbing or cyanosis.  HEENT: Normal.   Assessment/Plan: 1. CAD: S/p LIMA-LAD.  He has decided not to take statins after reviewing the data (I did recommend taking a statin but we have agreed to disagree on this, he is taking red yeast rice extract).  Lexiscan Cardiolite was done in 9/17 due to atypical chest pain.  This showed prior infarction with no ischemia.  No recurrence of chest pain since that time despite ongoing exercise.   - Continue ASA 81 daily.  2. S/p mitral valve repair: The MV repair looked stable on 7/19 echo with no evidence for significant mitral stenosis and mild MR.  3. Aortic insufficiency: Moderate on 9/18 echo.  Echo in 7/19 with mild-moderate AI.  4. Chronic systolic CHF: Ischemic cardiomyopathy, EF 25% on 7/19 echo, stable.  PA pressure remained elevated by doppler measurement with mildly decreased RV systolic function.  Most recent CPX in 8/19 with moderate HF limitation, mildly worse than prior.  He was volume overloaded on 8/19 RHC with severe pulmonary hypertension but preserved cardiac output.  NYHA class II-III symptoms. He is not significantly volume overloaded on eam.  - Continue torsemide 60 mg daily, BMET today.  - Bisoprolol and digoxin were both stopped with bradycardia and lightheadedness.   - He did not tolerate Entresto.  Continue valsartan 40 mg bid, he has not tolerated uptitration.   - Continue eplerenone 25 (unable to tolerate increase).        - He has an IVCD around 130 msec.  Seen by [name], not thought that he would benefit appreciably from CRT as not true LBBB and not markedly wide.  -  Restart cardiac rehab.  5. Atrial fibrillation: s/p Maze in 10/14, then ablation in 3/15.  Prior to Maze, he was on Tikosyn but had breakthrough.  Currently, he is not anticoagulated due to history of prostate  bleeding and his choice.  He did have his LA appendage oversewn at time of MV surgery.   1% atrial fibrillation on 4/19 event monitor. He is in an ectopic atrial rhythm today with reasonable rate (same as last appt).  - Off nodal blockers with bradycardia.    - We have had discussions about what to do with his atrial fibrillation.  He is symptomatic at times when in atrial fibrillation/fluttter. He had breakthrough on Tikosyn in the past, but has had Maze and atrial fibrillation ablation since that time. He opted to hold off on trying Tikosyn again and currently feels like the atrial fibrillation burden is manageable.  6. Pulmonary hypertension: Severe by echo in 9/17 and on RHC in 10/17.  RV actually looked ok on 9/17 echo.  Mixed pulmonary venous and pulmonary arterial HTN on RHC.  It is possible that the [facility] component is due to pulmonary vascular remodeling in the setting of chronic mitral regurgitation prior to MV repair. Negative V/Q scan, no evidence for CTEPH.  PFTs normal in 9/14.  He is seeing Dr Lake Bells.  Sleep study showed moderate OSA but he has been unable to tolerate CPAP and is now using an oral device. PASP 76 mmHg by echo in 7/19.  He tried Engineer, site but stopped it so that he could take a nitric oxide supplement.  I repeated RHC in 8/19.  This showed severe mixed pulmonary venous/pulmonary arterial HTN with PVR 5.8 WU and preserved cardiac output. - He wants to continue to take nitric oxide-active herbal and wants to avoid Adcirca => interestingly, his symptoms seemd to improve with this - He has just started macitentan this week.  Will repeat 6 minute walk at next appointment when he has been on it for a few weeks.    - Would consider selexipag in the future.  7. OSA: Moderate.  Has not tolerated CPAP. Probably plays a role in recurrent atrial fibrillation and pulmonary hypertension as well as nocturnal sinus pauses.  He has been using his oral device. 8. Carotid stenosis: 2/18 carotid  dopplers at [facility] with < 50% bilateral stenosis.   9. Bradycardia: Nocturnal bradycardia likely related to OSA.  Now off digoxin and bisoprolol.   Followup in 6 wks.   Loralie Champagne 04/27/2018

## 2018-04-27 NOTE — Telephone Encounter (Signed)
Cardiac Rehab referral submitted

## 2018-04-28 NOTE — Progress Notes (Signed)
Discharge Progress Report  Patient Details  Name: Caleb DUNAGAN, PhD MRN: [id number] Date of Birth: [birth year] Referring Provider:   Flowsheet Row CARDIAC REHAB PHASE II ORIENTATION from 11/30/2017 in [place]  Referring Provider  Loralie Champagne, MD.       Number of Visits: 23   Reason for Discharge:  Early Exit:  Personal, frequent absence from viral illness   Smoking History:  Social History   Tobacco Use  Smoking Status Never Smoker  Smokeless Tobacco Never Used    Diagnosis:  28/3151 Chronic systolic congestive heart failure ([place])  ADL UCSD:   Initial Exercise Prescription: Initial Exercise Prescription - 11/30/17 1500    Date of Initial Exercise RX and Referring Provider          Date  11/30/17    Referring Provider  Loralie Champagne, MD.        Bike          Level  1    Minutes  10    METs  3.7        NuStep          Level  3    SPM  85    Minutes  10    METs  3.4        Track          Laps  15    Minutes  10    METs  3.41        Prescription Details          Frequency (times per week)  3    Duration  Progress to 30 minutes of continuous aerobic without signs/symptoms of physical distress        Intensity          THRR 40-80% of Max Heartrate  56-113    Ratings of Perceived Exertion  11-13    Perceived Dyspnea  0-4        Progression          Progression  Continue to progress workloads to maintain intensity without signs/symptoms of physical distress.        Resistance Training          Training Prescription  Yes    Weight  3lbs    Reps  10-15           Discharge Exercise Prescription (Final Exercise Prescription Changes): Exercise Prescription Changes - 02/28/18 1500    Response to Exercise          Blood Pressure (Admit)  130/68    Blood Pressure (Exercise)  140/70    Blood Pressure (Exit)  118/72    Heart Rate (Admit)  77 bpm    Heart Rate (Exercise)  112 bpm    Heart Rate (Exit)   86 bpm    Rating of Perceived Exertion (Exercise)  12    Symptoms  none    Duration  Progress to 30 minutes of  aerobic without signs/symptoms of physical distress    Intensity  THRR unchanged        Progression          Progression  Continue to progress workloads to maintain intensity without signs/symptoms of physical distress.    Average METs  3.6        Resistance Training          Training Prescription  No        Interval Training  Interval Training  No        Bike          Level  1    Minutes  10    METs  3.7        NuStep          Level  5    SPM  85    Minutes  10    METs  3.4        Track          Laps  16    Minutes  10    METs  3.78        Home Exercise Plan          Plans to continue exercise at  Home (comment)    Frequency  Add 3 additional days to program exercise sessions.    Initial Home Exercises Provided  01/26/18           Functional Capacity: 6 Minute Walk    6 Minute Walk    Row Name 11/30/17 1350 02/16/18 1444   Phase  Initial  Discharge   Distance  1838 feet  1838 feet   Distance % Change  no documentation  0 %   Distance Feet Change  no documentation  0 ft   Walk Time  6 minutes  6 minutes   # of Rest Breaks  0  0   MPH  3.48  3.48   METS  4.13  3.84   RPE  15  12   VO2 Peak  14.44  13.45   Symptoms  No  No   Resting HR  68 bpm  77 bpm   Resting BP  124/70  130/68   Resting Oxygen Saturation   97 %  no documentation   Exercise Oxygen Saturation  during 6 min walk  98 %  no documentation   Max Ex. HR  118 bpm  112 bpm   Max Ex. BP  164/78  140/70   2 Minute Post BP  124/72  118/72          Psychological, QOL, Others - Outcomes: PHQ 2/9: Depression screen [facility] 2/9 03/29/2018 01/19/2018 02/27/2016 02/07/2014  Decreased Interest 0 0 0 0  Down, Depressed, Hopeless 0 0 0 0  PHQ - 2 Score 0 0 0 0  Some recent data might be hidden    Quality of Life: Quality of Life - 01/26/18 1416    Quality of Life  Scores          Health/Function Pre  14.73 %   pt notes minimal but noticeable improvement in DOE and fatigue. pt unsure if exercise or prescribe supplements are cause of improvement. pt encouraged to continue exercise and congratulated on his improved symptoms.    Socioeconomic Pre  23.19 %    Psych/Spiritual Pre  19.5 %    Family Pre  24 %    GLOBAL Pre  18.94 %   overall pt verbalizes good coping skills and resources.  pt encouraged gratitude, exercise and socialization are good tools for improved outlook          Personal Goals: Goals established at orientation with interventions provided to work toward goal. Personal Goals and Risk Factors at Admission - 11/30/17 1522    Core Components/Risk Factors/Patient Goals on Admission          Improve shortness of breath with ADL's  Yes    Intervention  Provide education, individualized exercise  plan and daily activity instruction to help decrease symptoms of SOB with activities of daily living.    Expected Outcomes  Short Term: Improve cardiorespiratory fitness to achieve a reduction of symptoms when performing ADLs;Long Term: Be able to perform more ADLs without symptoms or delay the onset of symptoms    Intervention  Provide education on lifestyle modifcations including regular physical activity/exercise, weight management, moderate sodium restriction and increased consumption of fresh fruit, vegetables, and low fat dairy, alcohol moderation, and smoking cessation.;Monitor prescription use compliance.    Expected Outcomes  Short Term: Continued assessment and intervention until BP is < 140/72m HG in hypertensive participants. < 130/857mHG in hypertensive participants with diabetes, heart failure or chronic kidney disease.;Long Term: Maintenance of blood pressure at goal levels.    Lipids  Yes    Intervention  Provide education and support for participant on nutrition & aerobic/resistive exercise along with prescribed medications to achieve  LDL <7078mHDL >54m49m  Expected Outcomes  Short Term: Participant states understanding of desired cholesterol values and is compliant with medications prescribed. Participant is following exercise prescription and nutrition guidelines.;Long Term: Cholesterol controlled with medications as prescribed, with individualized exercise RX and with personalized nutrition plan. Value goals: LDL < 70mg65mL > 40 mg.    Stress  Yes    Intervention  Offer individual and/or small group education and counseling on adjustment to heart disease, stress management and health-related lifestyle change. Teach and support self-help strategies.;Refer participants experiencing significant psychosocial distress to appropriate mental health specialists for further evaluation and treatment. When possible, include family members and significant others in education/counseling sessions.    Expected Outcomes  Short Term: Participant demonstrates changes in health-related behavior, relaxation and other stress management skills, ability to obtain effective social support, and compliance with psychotropic medications if prescribed.;Long Term: Emotional wellbeing is indicated by absence of clinically significant psychosocial distress or social isolation.    Personal Goal Other  Yes    Personal Goal  To improve endurance and breathing and improve cardiac function    Intervention  Will monitor progress in porgram    Expected Outcomes  patient will have an improvement in endurance upon completion of cardiac rehab            Personal Goals Discharge: Goals and Risk Factor Review    Core Components/Risk Factors/Patient Goals Review    Row Name 12/06/17 1523 12/23/17 0735 01/20/18 1144 02/10/18 1203 03/02/18 1748   Personal Goals Review  Improve shortness of breath with ADL's;Hypertension;Lipids;Stress;Other  Improve shortness of breath with ADL's;Hypertension;Lipids;Stress;Other  Improve shortness of breath with  ADL's;Hypertension;Lipids;Stress;Other  Improve shortness of breath with ADL's;Hypertension;Lipids;Stress;Other  Improve shortness of breath with ADL's;Hypertension;Lipids;Stress;Other   Review  pt with multiple CAD RF demonstrates eagerness to participate in CR program.pt personal goal to improve cardiac risk stratification, improve circulation and overall heart health.   pt with multiple CAD RF demonstrates eagerness to participate in CR program.pt personal goal to improve cardiac risk stratification, improve circulation and overall heart health. pt exhibits improvement with functional ability by increased workloads at CR and navigating stairs at home with less difficulty.     pt with multiple CAD RF demonstrates eagerness to participate in CR program.pt personal goal to improve cardiac risk stratification, improve circulation and overall heart health. pt reports improved walking ability at home, increased strength/stamina and decreased DOE/     pt with multiple CAD RF demonstrates eagerness to participate in CR program.pt personal goal to improve cardiac risk  stratification, improve circulation and overall heart health. pt reports continued improvement in walking ability at home, increased strength/stamina and decreased DOE.  pt with multiple CAD RF demonstrates eagerness to participate in CR program.pt personal goal to improve cardiac risk stratification, improve circulation and overall heart health. pt recent absences from viral illness.     Expected Outcomes  pt will participate in CR exercise, nutrition and lifestyle modification to decrease overall RF.   pt will participate in CR exercise, nutrition and lifestyle modification to decrease overall RF.   pt will participate in CR exercise, nutrition and lifestyle modification to decrease overall RF.   pt will participate in CR exercise, nutrition and lifestyle modification to decrease overall RF.   pt will participate in CR exercise, nutrition and lifestyle  modification to decrease overall RF.        Core Components/Risk Factors/Patient Goals Review    Row Name 04/28/18 1411   Personal Goals Review  Improve shortness of breath with ADL's;Hypertension;Lipids;Stress;Other   Review  pt exited from CR program due to continued symptoms from viral illness.    Expected Outcomes  pt will participate in exercise, nutrition and lifestyle modification to decrease disease progress and improved quality of life          Exercise Goals and Review: Exercise Goals    Exercise Goals    Row Name 11/30/17 1524   Increase Physical Activity  Yes   Intervention  Provide advice, education, support and counseling about physical activity/exercise needs.;Develop an individualized exercise prescription for aerobic and resistive training based on initial evaluation findings, risk stratification, comorbidities and participant's personal goals.   Expected Outcomes  Short Term: Attend rehab on a regular basis to increase amount of physical activity.;Long Term: Exercising regularly at least 3-5 days a week.;Long Term: Add in home exercise to make exercise part of routine and to increase amount of physical activity.   Increase Strength and Stamina  Yes   Intervention  Provide advice, education, support and counseling about physical activity/exercise needs.;Develop an individualized exercise prescription for aerobic and resistive training based on initial evaluation findings, risk stratification, comorbidities and participant's personal goals.   Expected Outcomes  Short Term: Increase workloads from initial exercise prescription for resistance, speed, and METs.;Short Term: Perform resistance training exercises routinely during rehab and add in resistance training at home;Long Term: Improve cardiorespiratory fitness, muscular endurance and strength as measured by increased METs and functional capacity (6MWT)   Able to understand and use rate of perceived exertion (RPE) scale  Yes    Intervention  Provide education and explanation on how to use RPE scale   Expected Outcomes  Short Term: Able to use RPE daily in rehab to express subjective intensity level;Long Term:  Able to use RPE to guide intensity level when exercising independently   Knowledge and understanding of Target Heart Rate Range (THRR)  Yes   Intervention  Provide education and explanation of THRR including how the numbers were predicted and where they are located for reference   Expected Outcomes  Long Term: Able to use THRR to govern intensity when exercising independently;Short Term: Able to state/look up THRR;Short Term: Able to use daily as guideline for intensity in rehab   Able to check pulse independently  Yes   Intervention  Provide education and demonstration on how to check pulse in carotid and radial arteries.;Review the importance of being able to check your own pulse for safety during independent exercise   Expected Outcomes  Short Term: Able  to explain why pulse checking is important during independent exercise;Long Term: Able to check pulse independently and accurately   Understanding of Exercise Prescription  Yes   Intervention  Provide education, explanation, and written materials on patient's individual exercise prescription   Expected Outcomes  Short Term: Able to explain program exercise prescription;Long Term: Able to explain home exercise prescription to exercise independently          Exercise Goals Re-Evaluation: Exercise Goals Re-Evaluation    Exercise Goal Re-Evaluation    Row Name 12/06/17 1420 12/20/17 1353 01/05/18 1621 01/20/18 0946 02/09/18 1434   Exercise Goals Review  Able to understand and use rate of perceived exertion (RPE) scale  Able to understand and use rate of perceived exertion (RPE) scale;Increase Physical Activity  Increase Physical Activity;Increase Strength and Stamina;Knowledge and understanding of Target Heart Rate Range (THRR);Able to understand and use rate of  perceived exertion (RPE) scale;Understanding of Exercise Prescription;Able to check pulse independently  Increase Physical Activity;Increase Strength and Stamina;Knowledge and understanding of Target Heart Rate Range (THRR);Able to understand and use rate of perceived exertion (RPE) scale;Understanding of Exercise Prescription;Able to check pulse independently  Increase Physical Activity;Increase Strength and Stamina;Able to understand and use rate of perceived exertion (RPE) scale;Knowledge and understanding of Target Heart Rate Range (THRR);Understanding of Exercise Prescription;Able to check pulse independently   Comments  Patient able to understand and use RPE scale appropriately.  Patient is walking 30 minutes, 2-3 days/week in addition to exercise at cardiac rehab.  Patient will continue to walk 3-4 days per week for 30 minutes in addition to CR exercise.   Pt is increasing MET level and has a MET level of 3.67. Tolerates exercise well. Exercises at home in addition to Cardiac Rehab.   Reviewed METs and goals with Pt. MET level is 3.56. Pt will continue to walk at home for 30 minutes 2-3 days per week in addition to cardiac rehab.    Expected Outcomes  Increase workloads as tolerated to help improve endurance and cardiac function.  Patient will continue current exercise routine: 30 minutes, 5-6 days/week to help achieve personal health and fitness goals.  Will continue to monitor and progress Pt as tolerated.   Will continue to monitor and progress Pt as tolerated.   Will continue to monitor and progress Pt as tolerated.        Exercise Goal Re-Evaluation    Row Name 03/02/18 1033   Exercise Goals Review  Increase Physical Activity;Increase Strength and Stamina;Able to understand and use rate of perceived exertion (RPE) scale;Knowledge and understanding of Target Heart Rate Range (THRR);Able to check pulse independently;Understanding of Exercise Prescription   Comments  Reviewed METs and goals with Pt.  MET level is 3.6. Pt will continue to walk at home for 30 minutes 2-3 days per week in addition to cardiac rehab.    Expected Outcomes  Will continue to monitor and progress Pt as tolerated.           Nutrition & Weight - Outcomes: Pre Biometrics - 11/30/17 1340    Pre Biometrics          Height  _0  (1.753 m)    Weight  70.4 kg    Waist Circumference  34 inches    Hip Circumference  38 inches    Waist to Hip Ratio  0.89 %    BMI (Calculated)  22.91    Triceps Skinfold  12 mm    % Body Fat  22.4 %  Grip Strength  36 kg    Flexibility  0 in    Single Leg Stand  3.31 seconds          Post Biometrics - 02/16/18 1444     Post  Biometrics          Height  _0  (1.753 m)    Weight  71 kg    Waist Circumference  34 inches    Hip Circumference  38 inches    Waist to Hip Ratio  0.89 %    BMI (Calculated)  23.1    Triceps Skinfold  13 mm    % Body Fat  22.8 %    Grip Strength  35 kg    Flexibility  0 in    Single Leg Stand  3.5 seconds           Nutrition: Nutrition Therapy & Goals - 11/30/17 1438    Nutrition Therapy          Diet  heart healthy, consistent carbohydrate        Personal Nutrition Goals          Nutrition Goal  Pt to identify and limit food sources of saturated fat, trans fat, refined carbohydrates and sodium    Personal Goal #2  Pt able to name foods that affect blood glucose        Intervention Plan          Intervention  Prescribe, educate and counsel regarding individualized specific dietary modifications aiming towards targeted core components such as weight, hypertension, lipid management, diabetes, heart failure and other comorbidities.    Expected Outcomes  Short Term Goal: Understand basic principles of dietary content, such as calories, fat, sodium, cholesterol and nutrients.;Long Term Goal: Adherence to prescribed nutrition plan.           Nutrition Discharge: Nutrition Assessments - 03/25/18 0758    MEDFICTS Scores           Pre Score  15    Post Score  --   did not return medficts post survey          Education Questionnaire Score: Knowledge Questionnaire Score - 11/30/17 1503    Knowledge Questionnaire Score          Pre Score  22/24           Goals reviewed with patient; copy given to patient.

## 2018-04-28 NOTE — Addendum Note (Signed)
Encounter addended by: Lowell Guitar, RN on: 04/28/2018 2:14 PM  Actions taken: Flowsheet data copied forward, Visit Navigator Flowsheet section accepted, Clinical Note Signed, Episode resolved

## 2018-04-29 ENCOUNTER — Telehealth (HOSPITAL_COMMUNITY): Payer: Self-pay

## 2018-04-29 NOTE — Telephone Encounter (Signed)
Prior authorization through [employer] company was APPROVED for [place] and will expire on 03/23/2019.

## 2018-05-04 ENCOUNTER — Telehealth (HOSPITAL_COMMUNITY): Payer: Self-pay

## 2018-05-04 NOTE — Telephone Encounter (Signed)
Opsumit prescription and statement of medical necessity faxed to [facility] signed by DM

## 2018-05-05 DIAGNOSIS — H2513 Age-related nuclear cataract, bilateral: Secondary | ICD-10-CM | POA: Diagnosis not present

## 2018-05-05 DIAGNOSIS — H43813 Vitreous degeneration, bilateral: Secondary | ICD-10-CM | POA: Diagnosis not present

## 2018-05-05 DIAGNOSIS — H1011 Acute atopic conjunctivitis, right eye: Secondary | ICD-10-CM | POA: Diagnosis not present

## 2018-05-06 ENCOUNTER — Telehealth (HOSPITAL_COMMUNITY): Payer: Self-pay | Admitting: Licensed Clinical Social Worker

## 2018-05-06 NOTE — Telephone Encounter (Signed)
CSW received call from pt yesterday stating that he had still not received a call from [facility] in regards to his opsumit.  CSW called [facility] who states they still have not received referral through the [facility] from [place].  CSW spoke with [employer] rep who states there was some missing information on the original application sent back in September of last year (the phone number and fax number for the clinic) and requested I send in the application with this information on it and they would finish processing and reach out to patient.  Requested information faxed  CSW updated pt this morning- pt now reporting he has had a bad reaction to this medication and is discussing with MD whether or not to proceed with taking it- would like to move forward with figuring out funding just in case he will continue to be on this medication  CSW will continue to follow and assist as needed  Caleb [name], [place] Worker [facility] [phone]

## 2018-05-11 ENCOUNTER — Telehealth (HOSPITAL_COMMUNITY): Payer: Self-pay

## 2018-05-11 MED FILL — POTASSIUM CHL ER M10 TABLET: 10 | 30 days supply | Qty: 60 | Fill #2

## 2018-05-11 NOTE — Telephone Encounter (Signed)
Pt insurance is active and benefits verified through Medicare A/B. Co-pay $0.00, DED $198.00/$8.42 met, out of pocket $0.00/$0.00 met, co-insurance 20%. No pre-authorization required. [employer]port, 05/11/2018 @ 11:27AM, REF# [id number]  2ndary insurance is active and benefits verified through [employer]. Co-pay $0.00, DED $0.00/$0.00 met, out of pocket $0.00/$0.00 met, co-insurance 0%. No pre-authorization required. [employer]port, 05/11/2018 @ 11:36AM, REF# [id number]

## 2018-05-13 ENCOUNTER — Other Ambulatory Visit (HOSPITAL_COMMUNITY): Payer: Self-pay

## 2018-05-13 MED ORDER — MACITENTAN 10 MG PO TABS: 10.0000 mg | ORAL_TABLET | Freq: Every day | ORAL | 6 refills | Status: DC

## 2018-05-13 NOTE — Telephone Encounter (Signed)
Attempted to call patient in regards to Cardiac Rehab - LM on VM Gloria W. Support Rep II

## 2018-05-16 NOTE — Telephone Encounter (Signed)
Pt returned CR phone call, he will come in for CR orientation 06/02/2018 @ 815AM and will attend the 1:15PM class time. Went over insurance, patient verbalized understanding.   Mailed homework package.

## 2018-05-26 ENCOUNTER — Telehealth (HOSPITAL_COMMUNITY): Payer: Self-pay

## 2018-05-26 NOTE — Telephone Encounter (Signed)
Entered in error

## 2018-05-30 ENCOUNTER — Ambulatory Visit (INDEPENDENT_AMBULATORY_CARE_PROVIDER_SITE_OTHER): Payer: Medicare Other | Admitting: Pulmonary Disease

## 2018-05-30 ENCOUNTER — Encounter: Payer: Self-pay | Admitting: Pulmonary Disease

## 2018-05-30 ENCOUNTER — Ambulatory Visit (INDEPENDENT_AMBULATORY_CARE_PROVIDER_SITE_OTHER)
Admission: RE | Admit: 2018-05-30 | Discharge: 2018-05-30 | Disposition: A | Discharge status: Home / Self Care | Payer: Medicare Other | Source: Ambulatory Visit | Attending: Pulmonary Disease | Admitting: Pulmonary Disease

## 2018-05-30 ENCOUNTER — Telehealth (HOSPITAL_COMMUNITY): Payer: Self-pay | Admitting: Pharmacist

## 2018-05-30 VITALS — BP 148/60 | HR 52 | Ht 69.0 in | Wt 157.6 lb

## 2018-05-30 DIAGNOSIS — R0609 Other forms of dyspnea: Secondary | ICD-10-CM | POA: Diagnosis not present

## 2018-05-30 DIAGNOSIS — I429 Cardiomyopathy, unspecified: Secondary | ICD-10-CM

## 2018-05-30 DIAGNOSIS — I272 Pulmonary hypertension, unspecified: Secondary | ICD-10-CM | POA: Diagnosis not present

## 2018-05-30 DIAGNOSIS — I5022 Chronic systolic (congestive) heart failure: Secondary | ICD-10-CM | POA: Diagnosis not present

## 2018-05-30 DIAGNOSIS — R5381 Other malaise: Secondary | ICD-10-CM

## 2018-05-30 LAB — BASIC METABOLIC PANEL
BUN: 24 mg/dL — ABNORMAL HIGH (ref 6–23)
CHLORIDE: 93 meq/L — AB (ref 96–112)
CO2: 32 mEq/L (ref 19–32)
Calcium: 9.7 mg/dL (ref 8.4–10.5)
Creatinine, Ser: 1.04 mg/dL (ref 0.40–1.50)
GFR: 68.79 mL/min (ref >60.00)
Glucose, Bld: 95 mg/dL (ref 70–99)
Potassium: 3.7 mEq/L (ref 3.5–5.1)
Sodium: 134 mEq/L — ABNORMAL LOW (ref 135–145)

## 2018-05-30 MED ORDER — METOLAZONE 5 MG PO TABS: 5.0000 mg | ORAL_TABLET | Freq: Every day | ORAL | 0 refills | Status: DC

## 2018-05-30 MED FILL — metOLazone 5 MG TABS: 5 | 1 days supply | Qty: 1 | Fill #0

## 2018-05-30 MED FILL — EPLERENONE 25 MG TABLET: 25 | 30 days supply | Qty: 30 | Fill #2

## 2018-05-30 NOTE — Patient Instructions (Signed)
It was nice seeing today in clinic  For you shortness of breath as discussed today in clinic, it is multi factorial, with your cardiac history, pulmonary hypertension and recent viral illness.  We will approach your shortness of breath in the following manner:  1. We will prescribe metolazone, you will take it tomorrow morning 30 min before your daily torsemide and this should help with fluid retention in your leg. Your goal weight should be around 149 lbs. We will need to check for electrolytes later this week by doing blood work.  2. Today we will ambulate you with pulse oximetry to try to assess any desaturation with ambulation. We will also a chest X ray to help [place] further assess any underlying lung tissue problems. Lastly, we will check your lung function by doing PFTs ( Pulmonary Function Test) in the office. This test will be compare to your previous test and gives [place] an idea of how your lung function has changed in the past few years.  3. Finally, we will recommend resuming a structured exercise program to help improve your exercise capacity and hopefully improve your shortness of breath. We will like you to focus more on weight training with the idea of building different muscle groups.   4. We will see you in 4 weeks for reevaluation and will then decide if we need to change our therapeutic approach as far as having try a new class of medication for your pulmonary hypertension or continue with the exercise program.

## 2018-05-30 NOTE — Progress Notes (Signed)
   Subjective:    Patient ID: [name], PhD, male    DOB: January 06, 1941, 78 y.o.   MRN: [id number]  HPI    Review of Systems  Constitutional: Negative for fever and unexpected weight change.  HENT: Negative for congestion, dental problem, ear pain, nosebleeds, postnasal drip, rhinorrhea, sinus pressure, sneezing, sore throat and trouble swallowing.   Eyes: Negative for redness and itching.  Respiratory: Positive for cough and shortness of breath. Negative for chest tightness and wheezing.   Cardiovascular: Negative for palpitations and leg swelling.  Gastrointestinal: Negative for nausea and vomiting.  Genitourinary: Negative for dysuria.  Musculoskeletal: Positive for joint swelling.  Skin: Negative for rash.  Allergic/Immunologic: Negative.  Negative for environmental allergies, food allergies and immunocompromised state.  Neurological: Negative for headaches.  Hematological: Does not bruise/bleed easily.  Psychiatric/Behavioral: Negative for dysphoric mood. The patient is not nervous/anxious.        Objective:   Physical Exam        Assessment & Plan:

## 2018-05-30 NOTE — Progress Notes (Signed)
Subjective:   PATIENT ID: Caleb Moment, PhD GENDER: male DOB: 1941-02-17, MRN: [id number]   HPI  Chief Complaint  Patient presents with  . Pulm Consult    Referred by [name] for pulmonary hypertension. Per patient, he has some SOB that comes and goes. Not currently using O2.    Patient is a 78 year old male with past medical history significant for pulmonary hypertension, atrial fibrillation, ischemic cardiomyopathy with mitral valve repair, maze, CABG with LIMA-LAD who presents today as a new consult for worsening pulmonary hypertension.  Patient was seen about 3 years ago for cough related to GERD.  Since then patient reports that shortness of breath has worsened mostly in the past year.  Patient recently had a viral infection on Thanksgiving.  Patient reports that symptoms lasted for about 3 months with significant worsening of his shortness of breath and, fatigue and weakness.  Patient was recently seen by cardiology had heart cath which showed significant increase in pulmonary artery and dizziness pressure consistent with worsening of his pulmonary hypertension.  Patient was started on opsumit however patient had significant lower extremity swelling and stopped taking the medication in mid February.  Patient reports he has significant swelling in his lower extremity up to his mid thigh.  Close respiratory status was increased to 60 mg daily.  Swelling has improved for the past few weeks but patient still above his dry weight.  Is currently on 153 lbs. Patient was previously on Adcirca but did not notice any improvement in his respiratory status.  He has been using nitrous oxide also tomorrow reports good response in regards to his shortness of breath.  Due to his illness patient has been unable to exercise or walk as he used to a few months ago.  Patient attributes decreasing walking regimen to his recent viral illness.  Patient reports that he has been more difficult to climb flight of  stairs at home but is otherwise able to manage his activities of daily living.  Patient still has moderate lower extremity swelling up to his knee.  Patient uses a mouth speech piece instead of CPAP machine that he was unable to tolerate to help with his OSA.   Past Medical History:  Diagnosis Date  . Allergic rhinitis   . Atrial fibrillation ([place])   . Atypical pneumonia   . CAD (coronary artery disease)   . Cardiomyopathy, ischemic   . Chronic anticoagulation   . Cough   . Dizziness   . GERD (gastroesophageal reflux disease)   . Heart failure, systolic, acute on chronic ([place])   . Hyperlipidemia   . Hypertension   . OSA (obstructive sleep apnea)    Home sleep test 07/05/2009 AHI 8.2  . Pleural effusion   . Positional vertigo   . TIA (transient ischemic attack)      Family History  Adopted: Yes  Problem Relation Age of Onset  . Diabetes Unknown        [place]  . Heart disease Neg Hx   . Hypertension Neg Hx      Social History   Socioeconomic History  . Marital status: Married    Spouse name: Not on file  . Number of children: Not on file  . Years of education: Not on file  . Highest education level: Not on file  Occupational History  . Occupation: PHYSICIAN    Employer: [employer]: [employer]  Social Needs  . Financial resource strain: Not on file  .  Food insecurity:    Worry: Not on file    Inability: Not on file  . Transportation needs:    Medical: Not on file    Non-medical: Not on file  Tobacco Use  . Smoking status: Never Smoker  . Smokeless tobacco: Never Used  Substance and Sexual Activity  . Alcohol use: No  . Drug use: No  . Sexual activity: Not on file  Lifestyle  . Physical activity:    Days per week: Not on file    Minutes per session: Not on file  . Stress: Not on file  Relationships  . Social connections:    Talks on phone: Not on file    Gets together: Not on file    Attends religious service: Not on file    Active  member of club or organization: Not on file    Attends meetings of clubs or organizations: Not on file    Relationship status: Not on file  . Intimate partner violence:    Fear of current or ex partner: Not on file    Emotionally abused: Not on file    Physically abused: Not on file    Forced sexual activity: Not on file  Other Topics Concern  . Not on file  Social History Narrative   Married. Wife used to see Dr. Arnoldo Morale. He saw Dr. Sherren Mocha. 2 sons- 1 is anesthesiologist. 2 grandsons.       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       Hobbies:  Model train, reading, walking in woods, prior fishing, prior golfing     Allergies  Allergen Reactions  . Carvedilol Other (See Comments)    Dizziness, uneasiness, alopecia  . Codeine     Rapid thoughts, hallucinations  . Opsumit [Macitentan] Swelling    Per patient, caused extreme ankle swelling.   . Dabigatran Rash  . Pradaxa [Dabigatran Etexilate Mesylate] Rash  . Sulfonamide Derivatives Rash  . Xarelto [Rivaroxaban] Rash     Outpatient Medications Prior to Visit  Medication Sig Dispense Refill  . aspirin EC 81 MG tablet Take 1 tablet (81 mg total) by mouth daily. 30 tablet 3  . eplerenone (INSPRA) 25 MG tablet TAKE 1 TABLET BY MOUTH ONCE DAILY 30 tablet 11  . magnesium gluconate (MAGONATE) 500 MG tablet Take 500 mg by mouth 2 (two) times daily.     . Omega-3 Fatty Acids (FISH OIL PO) Take 360 mg by mouth daily.     . potassium chloride (K-DUR,KLOR-CON) 10 MEQ tablet Take 2 tablets (20 mEq total) by mouth daily. 60 tablet 3  . torsemide (DEMADEX) 20 MG tablet Take 3 tablets (60 mg total) by mouth daily. 90 tablet 6  . valsartan (DIOVAN) 40 MG tablet TAKE 1 AND 1/2 TABLETS BY MOUTH IN THE MORNING AND 2 TABLETS IN THE EVENING (Patient taking differently: Take 40 mg by mouth daily. ) 105 tablet 3  . macitentan (OPSUMIT) 10 MG tablet Take 1 tablet (10 mg total)  by mouth daily. 30 tablet 6   No facility-administered medications prior to visit.     Review of Systems  Constitutional: Negative.   HENT: Negative.   Eyes: Negative.   Respiratory: Positive for shortness of breath. Negative for wheezing.   Cardiovascular: Positive for leg swelling.  Gastrointestinal: Negative.   Genitourinary: Negative.   Musculoskeletal: Negative.   Neurological: Positive for weakness.  Endo/Heme/Allergies: Negative.   Psychiatric/Behavioral: Negative.  Objective:  Physical Exam Constitutional:      Appearance: Normal appearance. He is normal weight.  HENT:     Head: Normocephalic and atraumatic.     Nose: Nose normal.     Mouth/Throat:     Mouth: Mucous membranes are moist.     Pharynx: Oropharynx is clear.  Eyes:     Pupils: Pupils are equal, round, and reactive to light.  Neck:     Musculoskeletal: Normal range of motion.  Cardiovascular:     Rate and Rhythm: Normal rate and regular rhythm.     Pulses: Normal pulses.     Heart sounds: Normal heart sounds.  Pulmonary:     Effort: Pulmonary effort is normal.     Breath sounds: Normal breath sounds. No wheezing or rales.  Abdominal:     General: Bowel sounds are normal.  Musculoskeletal:     Right lower leg: Edema present.     Left lower leg: Edema present.     Comments: Up to his knees bilaterally +2 pitting  Skin:    General: Skin is warm and dry.     Capillary Refill: Capillary refill takes less than 2 seconds.  Neurological:     General: No focal deficit present.     Mental Status: He is alert.  Psychiatric:        Mood and Affect: Mood normal.      Vitals:   05/30/18 1103  BP: (!) 148/60  Pulse: (!) 52  Weight: 157 lb 9.6 oz (71.5 kg)  Height: _0  (1.753 m)     CBC    Component Value Date/Time   WBC 6.3 04/26/2018 1510   RBC 4.42 04/26/2018 1510   HGB 13.0 04/26/2018 1510   HCT 40.9 04/26/2018 1510   PLT 184 04/26/2018 1510   MCV 92.5 04/26/2018 1510   MCH  29.4 04/26/2018 1510   MCHC 31.8 04/26/2018 1510   RDW 15.4 04/26/2018 1510   LYMPHSABS 1.6 02/24/2018 1337   MONOABS 1.6 (H) 02/24/2018 1337   EOSABS 0.1 02/24/2018 1337   BASOSABS 0.1 02/24/2018 1337     Chest imaging:  PFT:  Labs:  Path:  Echo: 09/2017, Mildly dilated LV with EF 25%. Wall motion abnormalities as noted above. Mildly dilated RV with mildly decreased systolic function. s/p MV repair with mild MR, no MS. Mild to moderate aortic insufficiency. Severe pulmonary hypertension. Moderate biatrial enlargement  Heart Catheterization: 10/2017, Elevated right and left heart filling pressures.  Severe pulmonary hypertension, mixed pulmonary arterial and pulmonary venous hypertension.  Low but not markedly low cardiac output.      Assessment & Plan:   Pulmonary hypertension ([employer]) - Plan: Pulmonary function test, DG Chest 2 View  Discussion: Patient is a 78 year old male who presents today for worsening shortness of breath in the setting of severe pulmonary hypertension.  Patient reports worsening shortness of breath for the past year culminating this past winter with a viral infection contracted in November.  Patient also reports worsening fluid retention with echo showing reduced EF to 25% back in July 2019.  Patient has had increase in torsemide dose to 60 mg due to recent lower extremity swelling but reports minimal improvement.  He also was started on macitentan but was unable to continue medication due to significant lower extremity swelling.  He is currently using nitrous oxide and reports good response.  Patient also had significant decrease in exercise regimen due to recent illness.  He is scheduled to start cardiac rehab  next week.  Given symptoms in the past few months and findings on heart cath worsening pulmonary hypertension will further assess pulmonary function.  Patient has a clear decrease in functionality due to worsening lung function. --Order PFTs, chest x-ray and  ambulatory pulse ox to be done today in clinic. --Order metolazone once 5 mg for extremity swelling --Recheck BMP on Thursday --Recommend weight training in addition to cardiac rehab for muscle building. --See patient in 4 weeks to reassess status --Patient may very well need prostacyclin analog such as treprostinil in the future    Current Outpatient Medications:  .  aspirin EC 81 MG tablet, Take 1 tablet (81 mg total) by mouth daily., Disp: 30 tablet, Rfl: 3 .  eplerenone (INSPRA) 25 MG tablet, TAKE 1 TABLET BY MOUTH ONCE DAILY, Disp: 30 tablet, Rfl: 11 .  magnesium gluconate (MAGONATE) 500 MG tablet, Take 500 mg by mouth 2 (two) times daily. , Disp: , Rfl:  .  Omega-3 Fatty Acids (FISH OIL PO), Take 360 mg by mouth daily. , Disp: , Rfl:  .  potassium chloride (K-DUR,KLOR-CON) 10 MEQ tablet, Take 2 tablets (20 mEq total) by mouth daily., Disp: 60 tablet, Rfl: 3 .  torsemide (DEMADEX) 20 MG tablet, Take 3 tablets (60 mg total) by mouth daily., Disp: 90 tablet, Rfl: 6 .  valsartan (DIOVAN) 40 MG tablet, TAKE 1 AND 1/2 TABLETS BY MOUTH IN THE MORNING AND 2 TABLETS IN THE EVENING (Patient taking differently: Take 40 mg by mouth daily. ), Disp: 105 tablet, Rfl: 3

## 2018-05-30 NOTE — Progress Notes (Signed)
Subjective:    Patient ID: Caleb Moment, PhD, male    DOB: 01/28/1941, 78 y.o.   MRN: [id number] Synopsis: First seen in 05/2014 for second opinion on pulmonary hypertension. Came back in 03/2015 for assessment of a cough.  He has ischemic cardiomyopathy.  Returned again in 05/2018 for second opinion on pulmonary hypertension management.   HPI Chief Complaint  Patient presents with  . Pulm Consult    Referred by [name] for pulmonary hypertension. Per patient, he has some SOB that comes and goes. Not currently using O2.     Took Opsumit recently for worsening pulmonary hypertension but he stopped this because of worsening leg swelling.  He has a history of taking taladafil but stopped taking it because it didn't help much.  He says that ever since he has had bronchitis or pneumonia around November he has had profound shortness of breath with minimal exertion.  He was exercising routinely up until that point but unfortunately stopped.  He says that he previously was walking 20 minutes a day and when the weather was nasty outside he would climb stairs for 20 minutes.  However, he has not done any of this over the last 3 to 4 months.  He was started on OPSUMIT but it caused a lot of leg swelling so he stopped.  He has been taking a natural nitrate source.  He says that this helps.  Past Medical History:  Diagnosis Date  . Allergic rhinitis   . Atrial fibrillation ([place])   . Atypical pneumonia   . CAD (coronary artery disease)   . Cardiomyopathy, ischemic   . Chronic anticoagulation   . Cough   . Dizziness   . GERD (gastroesophageal reflux disease)   . Heart failure, systolic, acute on chronic ([place])   . Hyperlipidemia   . Hypertension   . OSA (obstructive sleep apnea)    Home sleep test 07/05/2009 AHI 8.2  . Pleural effusion   . Positional vertigo   . TIA (transient ischemic attack)       Review of Systems  Constitutional: Positive for fatigue. Negative for chills and fever.    HENT: Negative for postnasal drip, rhinorrhea and sinus pressure.   Respiratory: Positive for cough and shortness of breath. Negative for wheezing.   Cardiovascular: Positive for leg swelling. Negative for chest pain and palpitations.       Objective:   Physical Exam Vitals:   05/30/18 1103  BP: (!) 148/60  Pulse: (!) 52  Weight: 157 lb 9.6 oz (71.5 kg)  Height: _0  (1.753 m)   RA  Gen: elderly male, chronically ill appearing HENT: OP clear, TM's clear, neck supple PULM: CTA B, normal percussion CV: Irreg irreg, systolic murmur, no mgr, trace edema GI: BS+, soft, nontender Derm: no cyanosis or rash Psyche: normal mood and affect   Records from his most recent visit with Dr. Algernon Huxley reviewed where he was started on OPSUMIT.  Chest imaging: CXR from 05/2014 reviewed again, no pulmonary parenchymal disease 02/2018 CXR showed cardiomegally, interstitial infiltrate  Echo: 09/2017 LVEF 20-25%, LA dilated, s/p mitral valve repair, RV dilated, PA pressure estimate mid 70's  Heart Cath: [facility] (8/19): mean RA 14, PA 75/28 mean 47, mean PCWP 24, CI 2.44/PVR 5 Fick, CI 2.13/PVR 5.8 thermo     Assessment & Plan:   Pulmonary hypertension ([place]) - Plan: Pulmonary function test, DG Chest 2 View, Basic metabolic panel, Basic metabolic panel  Dyspnea on exertion  Cardiomyopathy, unspecified type (  [place])  Chronic systolic heart failure ([facility])  Physical deconditioning  Discussion: Ibn returns to clinic today for evaluation of worsening shortness of breath after a viral respiratory infection led to bronchitis and pneumonia.  He recently has been started on OPSUMIT because a right heart catheterization in the latter half of 2019 showed severe pulmonary hypertension as well as volume overload.  He stopped taking this medicine because of leg swelling which is still a problem for him.  On his physical exam today he still showing signs of significant fluid retention.  I think he is  profoundly deconditioned.  I explained to him today that I am happy to repeat a lung work-up to make sure there is no underlying lung problem but I think the most likely cause of his shortness of breath is his advanced systolic heart failure, secondary pulmonary hypertension, severe physical deconditioning, and current mild volume overload.  While there may be some value in trying the prostacyclin class of pulmonary vasodilators I would not make an attempt at this until after he has diuresed some and started a regular exercise routine.  I think that he will gain quite a bit of ground symptomatically with those measures alone.  Shortness of breath: We will check to see if there is a lung problem by checking a lung function test, repeating a chest x-ray, and checking your oxygen level when you walk today  Pulmonary hypertension: Several options here, I think he needs to be diuresed more. I am going to prescribe a single pill of metolazone and hopefully this will help get his fluid off this week, I think his dry weight should probably be in the high 140s I encouraged him to consider a third class of pulmonary hypertension medicines in the future: Prostacyclin's with drugs like selexipag  Severe physical deconditioning: Go back to cardiac rehab Focus on strength training: Would avoid anything that involves a Valsalva type effort but efforts at buttock, hip, quadricep strengthening and shoulder strengthening would be of high value  Return to clinic in 6 weeks to go over the results of lung function testing, chest x-ray and discuss further options  > 50% of this 60 min visit was spent face to face   Current Outpatient Medications:  .  aspirin EC 81 MG tablet, Take 1 tablet (81 mg total) by mouth daily., Disp: 30 tablet, Rfl: 3 .  eplerenone (INSPRA) 25 MG tablet, TAKE 1 TABLET BY MOUTH ONCE DAILY, Disp: 30 tablet, Rfl: 11 .  magnesium gluconate (MAGONATE) 500 MG tablet, Take 500 mg by mouth 2 (two)  times daily. , Disp: , Rfl:  .  Omega-3 Fatty Acids (FISH OIL PO), Take 360 mg by mouth daily. , Disp: , Rfl:  .  potassium chloride (K-DUR,KLOR-CON) 10 MEQ tablet, Take 2 tablets (20 mEq total) by mouth daily., Disp: 60 tablet, Rfl: 3 .  torsemide (DEMADEX) 20 MG tablet, Take 3 tablets (60 mg total) by mouth daily., Disp: 90 tablet, Rfl: 6 .  valsartan (DIOVAN) 40 MG tablet, TAKE 1 AND 1/2 TABLETS BY MOUTH IN THE MORNING AND 2 TABLETS IN THE EVENING (Patient taking differently: Take 40 mg by mouth daily. ), Disp: 105 tablet, Rfl: 3 .  metolazone (ZAROXOLYN) 5 MG tablet, Take 1 tablet (5 mg total) by mouth daily., Disp: 1 tablet, Rfl: 0

## 2018-05-31 ENCOUNTER — Telehealth: Payer: Self-pay | Admitting: Pulmonary Disease

## 2018-05-31 DIAGNOSIS — I272 Pulmonary hypertension, unspecified: Secondary | ICD-10-CM

## 2018-05-31 NOTE — Telephone Encounter (Signed)
bmet  

## 2018-05-31 NOTE — Telephone Encounter (Signed)
Order has been placed under lab collect nothing further needed.

## 2018-05-31 NOTE — Telephone Encounter (Signed)
Called Caleb [name], he stated that BQ wanted him to have his blood work done this Thursday 06/02/18. Patient wants to have it done at the heart failure clinic. BQ please advise on labs that need to be ordered. Thank you.

## 2018-06-01 ENCOUNTER — Telehealth: Payer: Self-pay | Admitting: Pulmonary Disease

## 2018-06-01 ENCOUNTER — Telehealth (HOSPITAL_COMMUNITY): Payer: Self-pay | Admitting: *Deleted

## 2018-06-01 DIAGNOSIS — I5022 Chronic systolic (congestive) heart failure: Secondary | ICD-10-CM

## 2018-06-01 NOTE — Telephone Encounter (Signed)
Pt called asking if he could have his labs done here instead of going to Dr Kathee Delton office, advised that would be ok, order placed, appt sch

## 2018-06-01 NOTE — Telephone Encounter (Signed)
Called and spoke with pt letting him know that BQ said he could get labs done at [facility] and order has been placed. Pt expressed understanding. nothing further needed.

## 2018-06-01 NOTE — Progress Notes (Signed)
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 male DOB 08/02/1940 MRN [id number]       Nutrition Screen Note  No diagnosis found. Past Medical History:  Diagnosis Date  . Allergic rhinitis   . Atrial fibrillation ([place])   . Atypical pneumonia   . CAD (coronary artery disease)   . Cardiomyopathy, ischemic   . Chronic anticoagulation   . Cough   . Dizziness   . GERD (gastroesophageal reflux disease)   . Heart failure, systolic, acute on chronic ([place])   . Hyperlipidemia   . Hypertension   . OSA (obstructive sleep apnea)    Home sleep test 07/05/2009 AHI 8.2  . Pleural effusion   . Positional vertigo   . TIA (transient ischemic attack)    Meds reviewed.     Current Outpatient Medications (Cardiovascular):  .  eplerenone (INSPRA) 25 MG tablet, TAKE 1 TABLET BY MOUTH ONCE DAILY .  metolazone (ZAROXOLYN) 5 MG tablet, Take 1 tablet (5 mg total) by mouth daily. Marland Kitchen  torsemide (DEMADEX) 20 MG tablet, Take 3 tablets (60 mg total) by mouth daily. .  valsartan (DIOVAN) 40 MG tablet, TAKE 1 AND 1/2 TABLETS BY MOUTH IN THE MORNING AND 2 TABLETS IN THE EVENING (Patient taking differently: Take 40 mg by mouth daily. )   Current Outpatient Medications (Analgesics):  .  aspirin EC 81 MG tablet, Take 1 tablet (81 mg total) by mouth daily.   Current Outpatient Medications (Other):  .  magnesium gluconate (MAGONATE) 500 MG tablet, Take 500 mg by mouth 2 (two) times daily.  .  Omega-3 Fatty Acids (FISH OIL PO), Take 360 mg by mouth daily.  .  potassium chloride (K-DUR,KLOR-CON) 10 MEQ tablet, Take 2 tablets (20 mEq total) by mouth daily.   HT: Ht Readings from Last 1 Encounters:  05/30/18 _0  (1.753 m)    WT: Wt Readings from Last 5 Encounters:  05/30/18 157 lb 9.6 oz (71.5 kg)  04/26/18 151 lb 12.8 oz (68.9 kg)  03/08/18 150 lb 9.6 oz (68.3 kg)  02/28/18 153 lb 9.6 oz (69.7 kg)  02/24/18 162 lb (73.5 kg)     BMI = 23.27   Current tobacco use? No       Labs:  Lipid Panel     Component Value  Date/Time   CHOL 206 (H) 10/07/2017 1557   TRIG 84 10/07/2017 1557   HDL 53 10/07/2017 1557   CHOLHDL 3.9 10/07/2017 1557   VLDL 17 10/07/2017 1557   LDLCALC 136 (H) 10/07/2017 1557   LDLDIRECT 143.9 05/11/2013 1003    Lab Results  Component Value Date   HGBA1C 5.5 08/17/2017   CBG (last 3)  No results for input(s): GLUCAP in the last 72 hours.  Nutrition Diagnosis ? Food-and nutrition-related knowledge deficit related to lack of exposure to information as related to diagnosis of: ? CVD  Nutrition Goal(s):  ? To be determined  Plan:  Pt to attend nutrition classes ? Nutrition I ? Nutrition II ? Portion Distortion  Will provide client-centered nutrition education as part of interdisciplinary care.   Monitor and evaluate progress toward nutrition goal with team.  Laurina Bustle, MS, RD, LDN 06/01/2018 10:56 AM

## 2018-06-02 ENCOUNTER — Encounter (HOSPITAL_COMMUNITY): Payer: Self-pay

## 2018-06-02 ENCOUNTER — Ambulatory Visit (HOSPITAL_COMMUNITY)
Admission: RE | Admit: 2018-06-02 | Discharge: 2018-06-02 | Disposition: A | Discharge status: Home / Self Care | Payer: Medicare Other | Source: Ambulatory Visit | Attending: Cardiology | Admitting: Cardiology

## 2018-06-02 ENCOUNTER — Encounter (HOSPITAL_COMMUNITY)
Admission: RE | Admit: 2018-06-02 | Discharge: 2018-06-02 | Disposition: A | Discharge status: Home / Self Care | Payer: Medicare Other | Source: Ambulatory Visit | Attending: Cardiology | Admitting: Cardiology

## 2018-06-02 ENCOUNTER — Other Ambulatory Visit: Payer: Self-pay

## 2018-06-02 VITALS — BP 110/60 | HR 84 | Ht 69.0 in | Wt 150.1 lb

## 2018-06-02 DIAGNOSIS — I5022 Chronic systolic (congestive) heart failure: Secondary | ICD-10-CM | POA: Diagnosis not present

## 2018-06-02 LAB — BASIC METABOLIC PANEL
Anion gap: 12 (ref 5–15)
BUN: 25 mg/dL — ABNORMAL HIGH (ref 8–23)
CO2: 30 mmol/L (ref 22–32)
CREATININE: 1.14 mg/dL (ref 0.61–1.24)
Calcium: 9.6 mg/dL (ref 8.9–10.3)
Chloride: 91 mmol/L — ABNORMAL LOW (ref 98–111)
GFR calc non Af Amer: >60 mL/min (ref >60)
Glucose, Bld: 81 mg/dL (ref 70–99)
Potassium: 3.4 mmol/L — ABNORMAL LOW (ref 3.5–5.1)
Sodium: 133 mmol/L — ABNORMAL LOW (ref 135–145)

## 2018-06-02 NOTE — Progress Notes (Signed)
Cardiac Individual Treatment Plan  Patient Details  Name: Caleb SINCLAIR, PhD MRN: [id number] Date of Birth: 09/26/1940 Referring Provider:     [facility] from 06/02/2018 in [place]  Referring Provider  Loralie Champagne, MD.      Initial Encounter Date:    CARDIAC REHAB PHASE II ORIENTATION from 06/02/2018 in [place]  Date  06/02/18      Visit Diagnosis: [id number]/5397 Chronic systolic congestive heart failure ([place])  Patient's Home Medications on Admission:  Current Outpatient Medications:  .  aspirin EC 81 MG tablet, Take 1 tablet (81 mg total) by mouth daily., Disp: 30 tablet, Rfl: 3 .  eplerenone (INSPRA) 25 MG tablet, TAKE 1 TABLET BY MOUTH ONCE DAILY, Disp: 30 tablet, Rfl: 11 .  magnesium gluconate (MAGONATE) 500 MG tablet, Take 500 mg by mouth 2 (two) times daily. , Disp: , Rfl:  .  metolazone (ZAROXOLYN) 5 MG tablet, Take 1 tablet (5 mg total) by mouth daily., Disp: 1 tablet, Rfl: 0 .  Omega-3 Fatty Acids (FISH OIL PO), Take 360 mg by mouth daily. , Disp: , Rfl:  .  potassium chloride (K-DUR,KLOR-CON) 10 MEQ tablet, Take 2 tablets (20 mEq total) by mouth daily., Disp: 60 tablet, Rfl: 3 .  torsemide (DEMADEX) 20 MG tablet, Take 3 tablets (60 mg total) by mouth daily., Disp: 90 tablet, Rfl: 6 .  valsartan (DIOVAN) 40 MG tablet, TAKE 1 AND 1/2 TABLETS BY MOUTH IN THE MORNING AND 2 TABLETS IN THE EVENING (Patient taking differently: Take 40 mg by mouth daily. ), Disp: 105 tablet, Rfl: 3  Past Medical History: Past Medical History:  Diagnosis Date  . Allergic rhinitis   . Atrial fibrillation ([name])   . Atypical pneumonia   . CAD (coronary artery disease)   . Cardiomyopathy, ischemic   . Chronic anticoagulation   . Cough   . Dizziness   . GERD (gastroesophageal reflux disease)   . Heart failure, systolic, acute on chronic ([place])   . Hyperlipidemia   . Hypertension   . OSA (obstructive sleep  apnea)    Home sleep test 07/05/2009 AHI 8.2  . Pleural effusion   . Positional vertigo   . TIA (transient ischemic attack)     Tobacco Use: Social History   Tobacco Use  Smoking Status Never Smoker  Smokeless Tobacco Never Used    Labs: Recent Review Flowsheet Data    Labs for ITP Cardiac and Pulmonary Rehab Latest Ref Rng & Units 01/10/2016 01/10/2016 08/17/2017 10/07/2017 11/16/2017   Cholestrol 0 - 200 mg/dL - - 212(H) 206(H) -   LDLCALC 0 - 99 mg/dL - - 140(H) 136(H) -   LDLDIRECT mg/dL - - - - -   HDL >40 mg/dL - - 55 53 -   Trlycerides <150 mg/dL - - 86 84 -   Hemoglobin A1c 4.8 - 5.6 % - - 5.5 - -   PHART 7.350 - 7.450 - - - - -   PCO2ART 35.0 - 45.0 mmHg - - - - -   HCO3 20.0 - 28.0 mmol/L 26.7 26.1 - - 24.8   TCO2 22 - 32 mmol/L 28 27 - - 26   ACIDBASEDEF 0.0 - 2.0 mmol/L - - - - -   O2SAT % 68.0 67.0 - - 69.0      Capillary Blood Glucose: No results found for: GLUCAP   Exercise Target Goals: Exercise Program Goal: Individual exercise prescription set  using results from initial 6 min walk test and THRR while considering  patient's activity barriers and safety.   Exercise Prescription Goal: Initial exercise prescription builds to 30-45 minutes a day of aerobic activity, 2-3 days per week.  Home exercise guidelines will be given to patient during program as part of exercise prescription that the participant will acknowledge.  Activity Barriers & Risk Stratification: Activity Barriers & Cardiac Risk Stratification - 06/02/18 1015      Activity Barriers & Cardiac Risk Stratification   Activity Barriers  None    Cardiac Risk Stratification  High       6 Minute Walk: 6 Minute Walk    Row Name 06/02/18 1013         6 Minute Walk   Phase  Initial     Distance  1634 feet     Walk Time  6 minutes     # of Rest Breaks  0     MPH  3.1     METS  3.3     RPE  13     Perceived Dyspnea   0     VO2 Peak  11.64     Symptoms  No     Resting HR  84 bpm      Resting BP  110/60     Resting Oxygen Saturation   99 %     Exercise Oxygen Saturation  during 6 min walk  99 %     Max Ex. HR  101 bpm     Max Ex. BP  126/62     2 Minute Post BP  118/72        Oxygen Initial Assessment:   Oxygen Re-Evaluation:   Oxygen Discharge (Final Oxygen Re-Evaluation):   Initial Exercise Prescription: Initial Exercise Prescription - 06/02/18 1000      Date of Initial Exercise RX and Referring Provider   Date  06/02/18    Referring Provider  Loralie Champagne, MD.    Expected Discharge Date  09/07/18      Recumbant Bike   Level  1.5    Watts  25    Minutes  10    METs  3.12      NuStep   Level  2    SPM  85    Minutes  10    METs  3      Track   Laps  13    Minutes  10    METs  3.3      Prescription Details   Frequency (times per week)  3x    Duration  Progress to 30 minutes of continuous aerobic without signs/symptoms of physical distress      Intensity   THRR 40-80% of Max Heartrate  56-113    Ratings of Perceived Exertion  11-13    Perceived Dyspnea  0-4      Progression   Progression  Continue progressive overload as per policy without signs/symptoms or physical distress.      Resistance Training   Training Prescription  Yes    Weight  3lbs    Reps  10-15       Perform Capillary Blood Glucose checks as needed.  Exercise Prescription Changes:   Exercise Comments:   Exercise Goals and Review:  Exercise Goals    Row Name 06/02/18 1015             Exercise Goals   Increase Physical Activity  Yes  Intervention  Provide advice, education, support and counseling about physical activity/exercise needs.;Develop an individualized exercise prescription for aerobic and resistive training based on initial evaluation findings, risk stratification, comorbidities and participant's personal goals.       Expected Outcomes  Short Term: Attend rehab on a regular basis to increase amount of physical activity.;Long Term:  Exercising regularly at least 3-5 days a week.;Long Term: Add in home exercise to make exercise part of routine and to increase amount of physical activity.       Able to understand and use rate of perceived exertion (RPE) scale  Yes       Intervention  Provide education and explanation on how to use RPE scale       Expected Outcomes  Short Term: Able to use RPE daily in rehab to express subjective intensity level;Long Term:  Able to use RPE to guide intensity level when exercising independently       Knowledge and understanding of Target Heart Rate Range (THRR)  Yes       Intervention  Provide education and explanation of THRR including how the numbers were predicted and where they are located for reference       Expected Outcomes  Long Term: Able to use THRR to govern intensity when exercising independently;Short Term: Able to state/look up THRR;Short Term: Able to use daily as guideline for intensity in rehab       Able to check pulse independently  Yes       Intervention  Provide education and demonstration on how to check pulse in carotid and radial arteries.;Review the importance of being able to check your own pulse for safety during independent exercise       Expected Outcomes  Short Term: Able to explain why pulse checking is important during independent exercise;Long Term: Able to check pulse independently and accurately       Understanding of Exercise Prescription  Yes       Intervention  Provide education, explanation, and written materials on patient's individual exercise prescription       Expected Outcomes  Short Term: Able to explain program exercise prescription;Long Term: Able to explain home exercise prescription to exercise independently          Exercise Goals Re-Evaluation :   Discharge Exercise Prescription (Final Exercise Prescription Changes):   Nutrition:  Target Goals: Understanding of nutrition guidelines, daily intake of sodium <1576m, cholesterol <2020m calories  30% from fat and 7% or less from saturated fats, daily to have 5 or more servings of fruits and vegetables.  Biometrics: Pre Biometrics - 06/02/18 1014      Pre Biometrics   Height  _0  (1.753 m)    Weight  68.1 kg    Waist Circumference  34.5 inches    Hip Circumference  37.5 inches    Waist to Hip Ratio  0.92 %    BMI (Calculated)  22.16    Triceps Skinfold  18 mm    % Body Fat  24 %    Grip Strength  34 kg    Flexibility  0 in    Single Leg Stand  1.85 seconds        Nutrition Therapy Plan and Nutrition Goals: Nutrition Therapy & Goals - 06/02/18 1114      Nutrition Therapy   Diet  heart healthy      Personal Nutrition Goals   Nutrition Goal  to be determined       Nutrition Assessments: Nutrition  Assessments - 06/02/18 1114      MEDFICTS Scores   Pre Score  15       Nutrition Goals Re-Evaluation:   Nutrition Goals Re-Evaluation:   Nutrition Goals Discharge (Final Nutrition Goals Re-Evaluation):   Psychosocial: Target Goals: Acknowledge presence or absence of significant depression and/or stress, maximize coping skills, provide positive support system. Participant is able to verbalize types and ability to use techniques and skills needed for reducing stress and depression.  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  Yes      Barriers   Psychosocial barriers to participate in program  There are no identifiable barriers or psychosocial needs.      Screening Interventions   Interventions  Encouraged to exercise       Quality of Life Scores: Quality of Life - 06/02/18 1019      Quality of Life   Select  Quality of Life      Quality of Life Scores   Health/Function Pre  12.9 %    Socioeconomic Pre  28.75 %    Psych/Spiritual Pre  19.67 %     Psych/Spiritual Post  24 %    Psych/Spiritual % Change  22.01 %    Family Post  18.88 %      Scores of 19 and below usually indicate a poorer quality of life in these areas.  A difference of  2-3 points is a clinically meaningful difference.  A difference of 2-3 points in the total score of the Quality of Life Index has been associated with significant improvement in overall quality of life, self-image, physical symptoms, and general health in studies assessing change in quality of life.  PHQ-9: Recent Review Flowsheet Data    Depression screen [facility] 2/9 03/29/2018 01/19/2018 02/27/2016   Decreased Interest 0 0 0   Down, Depressed, Hopeless 0 0 0   PHQ - 2 Score 0 0 0     Interpretation of Total Score  Total Score Depression Severity:  1-4 = Minimal depression, 5-9 = Mild depression, 10-14 = Moderate depression, 15-19 = Moderately severe depression, 20-27 = Severe depression   Psychosocial Evaluation and Intervention:   Psychosocial Re-Evaluation:   Psychosocial Discharge (Final Psychosocial Re-Evaluation):   Vocational Rehabilitation: Provide vocational rehab assistance to qualifying candidates.   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 Participant will state understanding/return demonstration of topics presented.  Learning Barriers/Preferences: Learning Barriers/Preferences - 06/02/18 1018      Learning Barriers/Preferences   Learning Barriers  None    Learning Preferences  Skilled Demonstration;Individual Instruction;Written Material       Education Topics: Count Your Pulse:  -Group instruction provided by verbal  instruction, demonstration, patient participation and written materials to support subject.  Instructors address importance of being able to find your pulse and how to count your pulse when at home without a heart monitor.  Patients get hands  on experience counting their pulse with staff help and individually.   Heart Attack, Angina, and Risk Factor Modification:  -Group instruction provided by verbal instruction, video, and written materials to support subject.  Instructors address signs and symptoms of angina and heart attacks.    Also discuss risk factors for heart disease and how to make changes to improve heart health risk factors.   Functional Fitness:  -Group instruction provided by verbal instruction, demonstration, patient participation, and written materials to support subject.  Instructors address safety measures for doing things around the house.  Discuss how to get up and down off the floor, how to pick things up properly, how to safely get out of a chair without assistance, and balance training.   Meditation and Mindfulness:  -Group instruction provided by verbal instruction, patient participation, and written materials to support subject.  Instructor addresses importance of mindfulness and meditation practice to help reduce stress and improve awareness.  Instructor also leads participants through a meditation exercise.    Stretching for Flexibility and Mobility:  -Group instruction provided by verbal instruction, patient participation, and written materials to support subject.  Instructors lead participants through series of stretches that are designed to increase flexibility thus improving mobility.  These stretches are additional exercise for major muscle groups that are typically performed during regular warm up and cool down.   Hands Only CPR:  -Group verbal, video, and participation provides a basic overview of AHA guidelines for community CPR. Role-play of emergencies allow  participants the opportunity to practice calling for help and chest compression technique with discussion of AED use.   Hypertension: -Group verbal and written instruction that provides a basic overview of hypertension including the most recent diagnostic guidelines, risk factor reduction with self-care instructions and medication management.    Nutrition I class: Heart Healthy Eating:  -Group instruction provided by PowerPoint slides, verbal discussion, and written materials to support subject matter. The instructor gives an explanation and review of the Therapeutic Lifestyle Changes diet recommendations, which includes a discussion on lipid goals, dietary fat, sodium, fiber, plant stanol/sterol esters, sugar, and the components of a well-balanced, healthy diet.   Nutrition II class: Lifestyle Skills:  -Group instruction provided by PowerPoint slides, verbal discussion, and written materials to support subject matter. The instructor gives an explanation and review of label reading, grocery shopping for heart health, heart healthy recipe modifications, and ways to make healthier choices when eating out.   Diabetes Question & Answer:  -Group instruction provided by PowerPoint slides, verbal discussion, and written materials to support subject matter. The instructor gives an explanation and review of diabetes co-morbidities, pre- and post-prandial blood glucose goals, pre-exercise blood glucose goals, signs, symptoms, and treatment of hypoglycemia and hyperglycemia, and foot care basics.   Diabetes Blitz:  -Group instruction provided by PowerPoint slides, verbal discussion, and written materials to support subject matter. The instructor gives an explanation and review of the physiology behind type 1 and type 2 diabetes, diabetes medications and rational behind using different medications, pre- and post-prandial blood glucose recommendations and Hemoglobin A1c goals, diabetes diet, and exercise  including blood glucose guidelines for exercising safely.    Portion Distortion:  -Group instruction provided by PowerPoint slides, verbal discussion, written materials, and food models to support subject matter. The instructor gives an explanation of serving size versus portion size, changes in portions sizes over the last 20 years, and what consists of a serving from each food group.   Stress Management:  -Group instruction provided  by verbal instruction, video, and written materials to support subject matter.  Instructors review role of stress in heart disease and how to cope with stress positively.     Exercising on Your Own:  -Group instruction provided by verbal instruction, power point, and written materials to support subject.  Instructors discuss benefits of exercise, components of exercise, frequency and intensity of exercise, and end points for exercise.  Also discuss use of nitroglycerin and activating EMS.  Review options of places to exercise outside of rehab.  Review guidelines for sex with heart disease.   Cardiac Drugs I:  -Group instruction provided by verbal instruction and written materials to support subject.  Instructor reviews cardiac drug classes: antiplatelets, anticoagulants, beta blockers, and statins.  Instructor discusses reasons, side effects, and lifestyle considerations for each drug class.   Cardiac Drugs II:  -Group instruction provided by verbal instruction and written materials to support subject.  Instructor reviews cardiac drug classes: angiotensin converting enzyme inhibitors (ACE-I), angiotensin II receptor blockers (ARBs), nitrates, and calcium channel blockers.  Instructor discusses reasons, side effects, and lifestyle considerations for each drug class.   Anatomy and Physiology of the Circulatory System:  Group verbal and written instruction and models provide basic cardiac anatomy and physiology, with the coronary electrical and arterial systems.  Review of: AMI, Angina, Valve disease, Heart Failure, Peripheral Artery Disease, Cardiac Arrhythmia, Pacemakers, and the ICD.   Other Education:  -Group or individual verbal, written, or video instructions that support the educational goals of the cardiac rehab program.   Holiday Eating Survival Tips:  -Group instruction provided by PowerPoint slides, verbal discussion, and written materials to support subject matter. The instructor gives patients tips, tricks, and techniques to help them not only survive but enjoy the holidays despite the onslaught of food that accompanies the holidays.   Knowledge Questionnaire Score: Knowledge Questionnaire Score - 06/02/18 1018      Knowledge Questionnaire Score   Pre Score  21/24       Core Components/Risk Factors/Patient Goals at Admission: Personal Goals and Risk Factors at Admission - 06/02/18 1019      Core Components/Risk Factors/Patient Goals on Admission    Weight Management  Weight Gain;Weight Maintenance    Improve shortness of breath with ADL's  Yes    Intervention  Provide education, individualized exercise plan and daily activity instruction to help decrease symptoms of SOB with activities of daily living.    Expected Outcomes  Short Term: Improve cardiorespiratory fitness to achieve a reduction of symptoms when performing ADLs;Long Term: Be able to perform more ADLs without symptoms or delay the onset of symptoms    Heart Failure  Yes    Intervention  Provide a combined exercise and nutrition program that is supplemented with education, support and counseling about heart failure. Directed toward relieving symptoms such as shortness of breath, decreased exercise tolerance, and extremity edema.    Expected Outcomes  Improve functional capacity of life;Short term: Attendance in program 2-3 days a week with increased exercise capacity. Reported lower sodium intake. Reported increased fruit and vegetable intake. Reports medication  compliance.;Short term: Daily weights obtained and reported for increase. Utilizing diuretic protocols set by physician.;Long term: Adoption of self-care skills and reduction of barriers for early signs and symptoms recognition and intervention leading to self-care maintenance.    Lipids  Yes    Intervention  Provide education and support for participant on nutrition & aerobic/resistive exercise along with prescribed medications to achieve LDL <38m, HDL >424m  Expected Outcomes  Short Term: Participant states understanding of desired cholesterol values and is compliant with medications prescribed. Participant is following exercise prescription and nutrition guidelines.;Long Term: Cholesterol controlled with medications as prescribed, with individualized exercise RX and with personalized nutrition plan. Value goals: LDL < 44m, HDL > 40 mg.    Stress  Yes    Intervention  Offer individual and/or small group education and counseling on adjustment to heart disease, stress management and health-related lifestyle change. Teach and support self-help strategies.;Refer participants experiencing significant psychosocial distress to appropriate mental health specialists for further evaluation and treatment. When possible, include family members and significant others in education/counseling sessions.    Expected Outcomes  Short Term: Participant demonstrates changes in health-related behavior, relaxation and other stress management skills, ability to obtain effective social support, and compliance with psychotropic medications if prescribed.;Long Term: Emotional wellbeing is indicated by absence of clinically significant psychosocial distress or social isolation.       Core Components/Risk Factors/Patient Goals Review:    Core Components/Risk Factors/Patient Goals at Discharge (Final Review):    ITP Comments: ITP Comments    Row Name 06/02/18 1013           ITP Comments  Dr. TFransico Him Medical  Director           Comments: [place]heeattended orientation from [phone] [phone] to review rules and guidelines for program. Completed 6 minute walk test, Intitial ITP, and exercise prescription.  VSS. Telemetry-Atrial fibrillation with PVC's. Patient has a history of Atrial fibrillation  Asymptomatic.Will send today's ECG tracings to Dr MClaris Gladdenoffice for review. 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

## 2018-06-03 ENCOUNTER — Telehealth (HOSPITAL_COMMUNITY): Payer: Self-pay

## 2018-06-03 NOTE — Telephone Encounter (Signed)
Spoke with patient, aware of lab results. Aware of dietary recommendations to improve K.  Verbalized understanding. Has f/u on Monday.

## 2018-06-03 NOTE — Telephone Encounter (Signed)
-----  Message from Larey Dresser, MD sent at 06/02/2018 11:53 PM EDT ----- Increase K in diet.

## 2018-06-06 ENCOUNTER — Encounter (HOSPITAL_COMMUNITY): Payer: Self-pay | Admitting: Cardiology

## 2018-06-06 ENCOUNTER — Telehealth (HOSPITAL_COMMUNITY): Payer: Self-pay | Admitting: *Deleted

## 2018-06-06 ENCOUNTER — Other Ambulatory Visit: Payer: Self-pay

## 2018-06-06 ENCOUNTER — Ambulatory Visit (HOSPITAL_COMMUNITY)
Admission: RE | Admit: 2018-06-06 | Discharge: 2018-06-06 | Disposition: A | Discharge status: Home / Self Care | Payer: Medicare Other | Source: Ambulatory Visit | Attending: Cardiology | Admitting: Cardiology

## 2018-06-06 VITALS — BP 122/60 | HR 63 | Wt 154.4 lb

## 2018-06-06 DIAGNOSIS — I872 Venous insufficiency (chronic) (peripheral): Secondary | ICD-10-CM | POA: Insufficient documentation

## 2018-06-06 DIAGNOSIS — G629 Polyneuropathy, unspecified: Secondary | ICD-10-CM | POA: Insufficient documentation

## 2018-06-06 DIAGNOSIS — R9431 Abnormal electrocardiogram [ECG] [EKG]: Secondary | ICD-10-CM | POA: Diagnosis not present

## 2018-06-06 DIAGNOSIS — I255 Ischemic cardiomyopathy: Secondary | ICD-10-CM | POA: Insufficient documentation

## 2018-06-06 DIAGNOSIS — I6523 Occlusion and stenosis of bilateral carotid arteries: Secondary | ICD-10-CM | POA: Insufficient documentation

## 2018-06-06 DIAGNOSIS — Z951 Presence of aortocoronary bypass graft: Secondary | ICD-10-CM | POA: Diagnosis not present

## 2018-06-06 DIAGNOSIS — I480 Paroxysmal atrial fibrillation: Secondary | ICD-10-CM

## 2018-06-06 DIAGNOSIS — I252 Old myocardial infarction: Secondary | ICD-10-CM | POA: Diagnosis not present

## 2018-06-06 DIAGNOSIS — I080 Rheumatic disorders of both mitral and aortic valves: Secondary | ICD-10-CM | POA: Insufficient documentation

## 2018-06-06 DIAGNOSIS — I110 Hypertensive heart disease with heart failure: Secondary | ICD-10-CM | POA: Insufficient documentation

## 2018-06-06 DIAGNOSIS — I2721 Secondary pulmonary arterial hypertension: Secondary | ICD-10-CM | POA: Diagnosis not present

## 2018-06-06 DIAGNOSIS — I4510 Unspecified right bundle-branch block: Secondary | ICD-10-CM | POA: Insufficient documentation

## 2018-06-06 DIAGNOSIS — R001 Bradycardia, unspecified: Secondary | ICD-10-CM | POA: Diagnosis not present

## 2018-06-06 DIAGNOSIS — I272 Pulmonary hypertension, unspecified: Secondary | ICD-10-CM | POA: Diagnosis not present

## 2018-06-06 DIAGNOSIS — Z8249 Family history of ischemic heart disease and other diseases of the circulatory system: Secondary | ICD-10-CM | POA: Diagnosis not present

## 2018-06-06 DIAGNOSIS — Z8673 Personal history of transient ischemic attack (TIA), and cerebral infarction without residual deficits: Secondary | ICD-10-CM | POA: Insufficient documentation

## 2018-06-06 DIAGNOSIS — I444 Left anterior fascicular block: Secondary | ICD-10-CM | POA: Insufficient documentation

## 2018-06-06 DIAGNOSIS — Z9889 Other specified postprocedural states: Secondary | ICD-10-CM

## 2018-06-06 DIAGNOSIS — E785 Hyperlipidemia, unspecified: Secondary | ICD-10-CM | POA: Insufficient documentation

## 2018-06-06 DIAGNOSIS — I5022 Chronic systolic (congestive) heart failure: Secondary | ICD-10-CM | POA: Diagnosis not present

## 2018-06-06 DIAGNOSIS — I2510 Atherosclerotic heart disease of native coronary artery without angina pectoris: Secondary | ICD-10-CM | POA: Insufficient documentation

## 2018-06-06 DIAGNOSIS — G4733 Obstructive sleep apnea (adult) (pediatric): Secondary | ICD-10-CM | POA: Insufficient documentation

## 2018-06-06 DIAGNOSIS — Z79899 Other long term (current) drug therapy: Secondary | ICD-10-CM | POA: Diagnosis not present

## 2018-06-06 DIAGNOSIS — Z7982 Long term (current) use of aspirin: Secondary | ICD-10-CM | POA: Insufficient documentation

## 2018-06-06 MED ORDER — TORSEMIDE 20 MG PO TABS: 60.0000 mg | ORAL_TABLET | Freq: Every day | ORAL | 6 refills | Status: DC

## 2018-06-06 MED ORDER — POTASSIUM CHLORIDE CRYS ER 20 MEQ PO TBCR: 50.0000 meq | EXTENDED_RELEASE_TABLET | Freq: Every day | ORAL | 3 refills | Status: DC

## 2018-06-06 MED FILL — POTASSIUM CHLORIDE CRYS ER: 20 | 90 days supply | Qty: 225 | Fill #0

## 2018-06-06 MED FILL — TORSEMIDE 20 MG TABLET: 20 | 77 days supply | Qty: 270 | Fill #0

## 2018-06-06 NOTE — Progress Notes (Signed)
Patient ID: [name], PhD, male   DOB: Mar 03, 1941, 78 y.o.   MRN: [id number]    Advanced Heart Failure Clinic Note   PCP: [name] EP: [name] Cardiology: [name]  78 y.o.with complex past history presents for heart failure followup.  Patient had anterior MI in 2004 and developed ischemic cardiomyopathy as well as mitral regurgitation. He also developed atrial fibrillation.  In 10/14, he had MV repair, Maze, CABG with LIMA-LAD, and LA appendage closure at [facility] in [place].  Subsequently, atrial fibrillation returned and he had an atrial fibrillation ablation in [facility] in 3/15.  He wore a Zio patch in 12/15 and had a low atrial fibrillation burden of 7%.  He has had a long-standing ischemic cardiomyopathy.  In 2015, EF was 20-25%.  Echo in 2016 also showed EF 25-30% but estimated PA pressure suggested severe pulmonary hypertension.     At initial appointment, he reported increased exertional dyspnea over a number of weeks.  RHC was done, showing mildly elevated PCWP with moderate pulmonary HTN and low cardiac index (1.93 thermo, 2.13 Fick).  V/Q scan showed no PE.  I started him on digoxin and have titrated his Lasix to 80 mg daily.  He is now on bisoprolol and able to tolerate it.  At last appointment, I had him try replacing valsartan with Entresto 24/26 bid.  He was unable to tolerate it (made him dizzy, though BP was not low).  Therefore, I had him restart valsartan.  CPX in 4/16 showed mildly decreased functional capacity.  He has tolerated Adcirca 20 mg daily.  He did not tolerate an attempt to uptitrate bisoprolol.  He tried CPAP but was unable to tolerate it.  He was unable to tolerate eplerenone 50 mg daily.  Last echo in 9/17 showed EF 20-25%, stable MV repair, normal RV, and PA systolic pressure 86 mmHg.   He had a Lexiscan Cardiolite in 9/17 that showed infarction, no ischemia.  [place] in 10/17 showed severe mixed pulmonary venous HTN/pulmonary arterial HTN with PVR 4.3  WU and PCWP mildly elevated.  Adcirca was increased to 40 mg daily.   At a prior appointment, he was having more palpitations.  Event monitor in 11/17 showed atrial fibrillation episodes, these were symptomatic.  Since then, the atrial fibrillation seems to have decreased.  He saw [name] to discuss Tikosyn initiation.  No decision was reached at that time, and since palpitations have decreased again, he wants to hold off on Tikosyn for now.   He has OSA.  He cannot tolerate CPAP but is using his oral appliance.    HR was lower in 5/18.  He came into the office with HR around 40.  ECG showed an ectopic atrial rhythm with very small P waves. I stopped bisoprolol and later digoxin.  HR has gone up since.  He wore an event monitor in 6/18 showing rare 3-5 second pauses at night only, rare atrial fibrillation, and rare junctional bradycardia.  He wore an event monitor again in 4/19, this showed rare afib (1% total) with nocturnal bradycardia and 1 nocturnal 3 second pause.  No concerning findings.   He stopped Adcirca as his insurance was no longer covering it.    He saw [name], and it was decided that he would be unlikely to improve much with CRT with his current IVCD (not true LBBB).   Echo (7/19) is comparable to the past with mildly dilated LV, EF 25% with wall motion abnormalities,  mild RV dilation/mild decreased function, mild to moderate AI, stable repaired mitral valve, PASP 76 mmHg.     Given worsening symptoms, he had RHC in 8/19.  This showed evidence for volume overload with severe pulmonary arterial hypertension, PVR 5.8 WU and preserved cardiac output. Torsemide was increased.    He was started on Opsumit.  He says that it helped his breathing but he was unable to continue it due to considerable worsening of his peripheral edema.  He has stopped Opsumit.  Weight today is up about 3 lbs compared to prior appt.  He took 1 dose of metolazone last week which he says significantly improved  his edema. He feels like he has been less energetic since he had a prolonged bout of bronchitis this winter.  Breathing is "up and down," generally ok walking for a moderate distance on flat ground, short of breath with stairs/hills. No chest pain, no palpitations.  ECG today appears to show an ectopic atrial rhythm.   ECG (personally reviewed): ectopic atrial rhythm, RBBB, LAFB  6 minute walk (3/16): 381 m  6 minute walk (5/16): 414.5 m 6 minute walk (10/16): 562 m 6 minute walk (1/17): 469 m 6 minute walk (8/17): 488 m 6 minute walk (6/18): 549 m 6 minute walk (11/18): 366 m 6 minute walk (9/19): 518 m  Labs (2/15): LDL 144 Labs (8/15): K 4.6, creatinine 0.9 Labs (12/15): HCT 42.3   Labs (2/16): K 4 => 4.2, creatinine 1.05 => 0.92, BNP 268 Labs (3/16): BNP 495 => 320, digoxin 0.4, RF 14.7 (very mild increase), TSH normal, HIV negative, anti-SCL70 negative, creatinine 0.91, K 4.4 Labs (7/16): K 5, creatinine 1.05, BNP 455, vitamin D normal, digoxin 0.4, B12 normal Labs (8/16): digoxin 0.8, BNP 305, K 4.2, creatinine 0.99 Labs (9/16): HCT 43.3, TSH normal, BNP 492 => 278, K 4.3, creatinine 0.97 => 1.04, digoxin 0.6, TSH normal, LDL 154, LDL-P 1654.  Labs (10/16): K 4.7, creatinine 1.1 Labs (1/17): pro-BNP 2065, digoxin 0.3, K 4.3, creatinine 1.10 => 1.28 Labs (3/17): K 4.1, creatinine 1.09, HCT 38.3 Labs (4/17): K 4.4, creatinine 0.99, digoxin 0.8 Labs (5/17): K 4.3, creatinine 1.08, BNP 317, hgb 13.1 Labs (8/17): K 4.6, creatinine 0.99, BNP 392, digoxin 0.5 Labs (9/17): K 4.4, creatinine 1.05 Labs (10/17): digoxin 0.5, K 4.1, creatinine 1.12, HCT 44.9, digoxin 0.5 Labs (11/17): K 4 => 3.8, creatinine 1.3 => 1.16, digoxin 0.5, BNP 296 Labs (12/17): K 4.1, creatinine 1.15, BNP 294, digoxin 0.7 Labs (2/18): LDL 135, Lp(a) 124, LDL-P 979  Labs (5/18): K 4.3, creatinine 1.07 => 0.95, BNP 224, hgb 14.4, digoxin 0.6 Labs (6/18): K 4.4, creatinine 1.08, hgb 14 Labs (8/18): K 4.1,  creatinine 1.05 Labs (12/18): K 3.7, creatinine 0.94 Labs (2/19): K 4.1, creatinine 0.99, BNP 255 Labs (5/19): K 4, creatinine 0.98, LDL 140 Labs (7/19): LDL 136 Labs (9/19): K 4.1, creatinine 0.91 Labs (12/19): K 3.6, creatinine 1.18, BNP 479, hgb 12.9 Labs (3/20): K 3.4, creatinine 1.14  PMH: 1. CAD: Anterior MI in 2004.  Cardiac surgery in 10/14 included LIMA-LAD.  - Lexiscan Cardiolite (9/17): EF 35%, infarct present with no ischemia.  2. Chronic mitral regurgitation: 10/14 surgery at [facility] with MV repair, Maze, LIMA-LAD, and LA appendage closure.  3. Atrial fibrillation: Paroxysmal.  He was initially on Tikosyn but had breakthrough atrial fibrillation.  H/o Maze in 2014.  Had recurrent atrial fibrillation with ablation in 3/15 by Dr Ola Spurr in [facility].  Not anticoagulated after 2 severe  prostate bleeding episodes.  LA appendage was oversewn with MV surgery.  - Zio patch in 12/15 with low atrial fibrillation burden (7%).  - Holter (9/16) with PACs, PVCs, short atrial fibrillation runs (nothing sustained). - Event monitor (11/17) with runs of atrial fibrillation and flutter.   - Event monitor (6/18) with rare atrial fibrillation - Event monitor 4/19 showing rare afib (1% total) with nocturnal bradycardia and 1 nocturnal 3 second pause.  No concerning findings.  4. Chronotropic incompetence.  5. HTN 6. Hyperlipidemia: Refuses statin.  7. Peripheral neuropathy 8. GERD 9. H/o BPPV 10. H/o TIA 11. Ischemic cardiomyopathy: cardiac MRI 7/14 with EF 35%, moderate MR, normal RV size and systolic function, extensive anterior and anteroseptal LGE suggestive of non-viable myocardium (this was prior to LIMA-LAD).  Echo 1/15 with EF 20-25%, moderate AI, PA systolic pressure 39 mmHg. Echo (1/16) with EF 20-25%, diffuse hypokinesis with regionality, moderate LV dilation, moderate AI, s/p MV repair with mild MR and normal gradients, RV dilated with mildly decreased systolic function, PA  systolic pressure 71 mmHg.   - RHC (2/16) with mean RA 9, PA 61/25 mean 40, mean PCWP 23, CI 2.13/PVR 4.3 (Fick), CI 1.93/PVR 4.7 (thermo).   - CPX (4/16) with peak VO2 17.9, VE/VCO2 34.7 => mildly decreased functional capacity.  - Spironolactone apparently caused cognitive deficits - Intolerant of Coreg due to development of severe alopecia - Unable to uptitrate bisoprolol due to intolerance.  - Lightheaded with Entresto.  - Unable to tolerate increase in valsartan to 80 mg bid.  - Echo (1/17) with EF 30-35%, moderate LV dilation, mild AI, s/p MV repair with mild MR, moderately dilated RV with mildly decreased systolic function, PA systolic pressure 69 mmHg.  - CPX (4/17): peak VO2 18, VE/VCO2 slope 33, RER 1.28 => mild to moderate functional impairment, mildly improved.  - Echo (9/17): EF 20-25% with regional WMAs, normal RV size and systolic function, PASP 86 mmHg, stable repaired mitral valve with mild MR, moderate AI.  - RHC (10/17): mean RA 9, PA 70/23 mean 43, PCWP mean 20, CI 2.96 Fick/2.84 Thermo, PVR 4.3 WU.  - Echo (9/18): severe LV dilation, EF 20-25% with WMAs, s/p MV repair with mild MR, moderate AI, moderate TR, PASP 68 mmHg, normal RV size and systolic function.  - Echo (7/19): mildly dilated LV, EF 25% with wall motion abnormalities, mild RV dilation/mild decreased function, mild to moderate AI, stable repaired mitral valve (no MS, mild MR), PASP 76 mmHg.   - CPX (8/19): peak VO2 18.9, VE/VCO2 slope 40, RER 1.09 => moderate HF limitation.  - RHC (8/19): mean RA 14, PA 75/28 mean 47, mean PCWP 24, CI 2.44/PVR 5 Fick, CI 2.13/PVR 5.8 thermo.  12. Carotid stenosis: Carotid dopplers (1/16) with 40-59% bilateral ICA stenosis. Carotid dopplers (4/17) with 40-59% BICA stenosis.  - Carotid dopplers (2/18) with < 50% BICA stenosis.  13. Aortic insufficiency: Moderate by last echo 9/17.  14. Pulmonary HTN: Mixed PAH and pulmonary venous hypertension.  PFTs (9/14) were normal.  V/Q scan  (2/16) with no evidence of acute or chronic PE.   - Unable to tolerate Opsumit due to extensive peripheral edema.  15. OSA: Moderate on 5/16 sleep study. Unable to tolerate CPAP.  Repeat sleep study at [facility] in 2/18 was also suggestive of OSA.  16. Venous insufficiency 17. Ventral hernia 18. Bradycardia: Holter (5/18) with overnight pauses up to 4.7 sec (likely due to OSA), occasional short atrial tachycardia runs, no atrial fibrillation, avg HR  70s, 3% PVCs.  - Event monitor (6/18): Rare 3-4 second pauses at night, rare atrial fibrillation, rare runs of junctional bradycardia.   SH: Married, lives in [place], Engineer, water, nonsmoker  FH: CAD  ROS: All systems reviewed and negative except as per HPI.   Current Outpatient Medications  Medication Sig Dispense Refill  . aspirin EC 81 MG tablet Take 1 tablet (81 mg total) by mouth daily. 30 tablet 3  . eplerenone (INSPRA) 25 MG tablet TAKE 1 TABLET BY MOUTH ONCE DAILY 30 tablet 11  . magnesium gluconate (MAGONATE) 500 MG tablet Take 500 mg by mouth 2 (two) times daily.     . Omega-3 Fatty Acids (FISH OIL PO) Take 360 mg by mouth daily.     . potassium chloride (K-DUR,KLOR-CON) 20 MEQ tablet Take 2.5 tablets (50 mEq total) by mouth daily. 225 tablet 3  . torsemide (DEMADEX) 20 MG tablet Take 3 tablets (60 mg total) by mouth daily. Alternate 43m (3 tabs) and 879m(4 tabs) every other day 270 tablet 6  . valsartan (DIOVAN) 40 MG tablet TAKE 1 AND 1/2 TABLETS BY MOUTH IN THE MORNING AND 2 TABLETS IN THE EVENING (Patient taking differently: Take 40 mg by mouth daily. Pt taking 4020mn the morning and 20m40m the evening) 105 tablet 3   No current facility-administered medications for this encounter.    BP 122/60   Pulse 63   Wt 70 kg (154 lb 6.4 oz)   SpO2 98%   BMI 22.80 kg/m    Wt Readings from Last 3 Encounters:  06/06/18 70 kg (154 lb 6.4 oz)  06/02/18 68.1 kg (150 lb 2.1 oz)  05/30/18 71.5 kg (157 lb 9.6 oz)    General: NAD  Neck: JVP 8 cm, no thyromegaly or thyroid nodule.  Lungs: Clear to auscultation bilaterally with normal respiratory effort. CV: Nondisplaced PMI.  Heart regular S1/S2, no S3/S4, 3/6 HSM LLSB/apex.  1+ ankle edema.  No carotid bruit.  Normal pedal pulses.  Abdomen: Soft, nontender, no hepatosplenomegaly, no distention.  Skin: Intact without lesions or rashes.  Neurologic: Alert and oriented x 3.  Psych: Normal affect. Extremities: No clubbing or cyanosis.  HEENT: Normal.   Assessment/Plan: 1. CAD: S/p LIMA-LAD.  He has decided not to take statins after reviewing the data (I did recommend taking a statin but we have agreed to disagree on this, he is taking red yeast rice extract).  Lexiscan Cardiolite was done in 9/17 due to atypical chest pain.  This showed prior infarction with no ischemia.  No recurrence of chest pain since that time despite ongoing exercise.   - Continue ASA 81 daily.  2. S/p mitral valve repair: The MV repair looked stable on 7/19 echo with no evidence for significant mitral stenosis and mild MR.  3. Aortic insufficiency: Moderate on 9/18 echo.  Echo in 7/19 with mild-moderate AI.  4. Chronic systolic CHF: Ischemic cardiomyopathy, EF 25% on 7/19 echo, stable.  PA pressure remained elevated by doppler measurement with mildly decreased RV systolic function.  Most recent CPX in 8/19 with moderate HF limitation, mildly worse than prior.  He was volume overloaded on 8/19 RHC with severe pulmonary hypertension but preserved cardiac output.  NYHA class II-III symptoms. He looks volume overloaded on exam today.   - Increase torsemide to 60 daily alternating with 80 daily. Increase KCl to 50 mEq daily. BMET 10 days.  - Bisoprolol and digoxin were both stopped with bradycardia and lightheadedness.   - He  did not tolerate Entresto.  Continue valsartan 40 qam/80 qpm, he has not tolerated uptitration.   - Continue eplerenone 25 (unable to tolerate increase).        - He does not qualify  for CRT.   - Restart cardiac rehab.  5. Atrial fibrillation: s/p Maze in 10/14, then ablation in 3/15.  Prior to Maze, he was on Tikosyn but had breakthrough.  Currently, he is not anticoagulated due to history of prostate bleeding and his choice.  He did have his LA appendage oversewn at time of MV surgery.   1% atrial fibrillation on 4/19 event monitor. He is in an ectopic atrial rhythm today with reasonable rate (same as last appt).  - Off nodal blockers with bradycardia.    - We have had discussions about what to do with his atrial fibrillation.  He is symptomatic at times when in atrial fibrillation/fluttter. He had breakthrough on Tikosyn in the past, but has had Maze and atrial fibrillation ablation since that time. He opted to hold off on trying Tikosyn again and currently feels like the atrial fibrillation burden is manageable.  6. Pulmonary hypertension: Severe by echo in 9/17 and on RHC in 10/17.  RV actually looked ok on 9/17 echo.  Mixed pulmonary venous and pulmonary arterial HTN on RHC.  It is possible that the [facility] component is due to pulmonary vascular remodeling in the setting of chronic mitral regurgitation prior to MV repair. Negative V/Q scan, no evidence for CTEPH.  PFTs normal in 9/14.  He is seeing Dr Lake Bells.  Sleep study showed moderate OSA but he has been unable to tolerate CPAP and is now using an oral device. PASP 76 mmHg by echo in 7/19.  He tried Engineer, site but stopped it so that he could take a nitric oxide supplement.  I repeated RHC in 8/19.  This showed severe mixed pulmonary venous/pulmonary arterial HTN with PVR 5.8 WU and preserved cardiac output. He was started on Opsumit but did not tolerate due to extensive peripheral edema.  - He wants to continue to take nitric oxide-active herbal and wants to avoid Adcirca => interestingly, his symptoms seemd to improve with this - Breathing improved with Opsumit, but he was unable to tolerate due to severe peripheral edema.     - We  discussed a trial of selexipag.  He wants to think about this and will get back to me regarding his decision.  7. OSA: Moderate.  Has not tolerated CPAP. Probably plays a role in recurrent atrial fibrillation and pulmonary hypertension as well as nocturnal sinus pauses.  He has been using his oral device. 8. Carotid stenosis: 2/18 carotid dopplers at [facility] with < 50% bilateral stenosis.   9. Bradycardia: Nocturnal bradycardia likely related to OSA.  Now off digoxin and bisoprolol.   Followup in 2 months.   Loralie Champagne 06/06/2018

## 2018-06-06 NOTE — Telephone Encounter (Signed)
Pt called to update about closure of cardiac rehab for two weeks.  Pt was scheduled to start cardiac rehab on 06/13/2018.  This is tentatively postponed until 06/20/2018. Message left.

## 2018-06-06 NOTE — Patient Instructions (Signed)
Alternate Torsemide 51m (3 tabs) with 822m(4 tabs) every other day.  INCREASE Potassium to 50 mg (2.5 tabs) daily.  Please follow up lab work in 10 days.  EKG was completed.  Please call the office at [phone] you would like to try [place] for Pulmonary Hypertension.   Your physician recommends that you schedule a follow-up appointment in: 2 MONTHS.

## 2018-06-07 ENCOUNTER — Telehealth (HOSPITAL_COMMUNITY): Payer: Self-pay

## 2018-06-07 MED FILL — EPLERENONE 25 MG TABLET: 25 | 30 days supply | Qty: 30 | Fill #3

## 2018-06-07 NOTE — Progress Notes (Signed)
I have reviewed a Home Exercise Prescription with [name], PhD . Dameer is currently exercising at home.  The patient was advised to walk 5-7 days a week for 30-45 minutes.  Elward and I discussed how to progress their exercise prescription.  The patient stated that their goals were to walk at home for exercise in addition to Cardiac Rehab.  The patient stated that they understand the exercise prescription.  We reviewed exercise guidelines, target heart rate during exercise, RPE Scale, weather conditions, NTG use, endpoints for exercise, warmup and cool down.  Patient is encouraged to come to me with any questions. I will continue to follow up with the patient to assist them with progression and safety.    06/07/2018 3:25 PM  Deitra Mayo BS, ACSM CEP

## 2018-06-07 NOTE — Telephone Encounter (Signed)
Phone call to Pt to discuss home exercise recommendations due to Cardiac Rehab closure. Pt was responsive and understands goals of home exercise. Will mail guidelines to Pt to have reference to.

## 2018-06-13 ENCOUNTER — Ambulatory Visit (HOSPITAL_COMMUNITY): Payer: Medicare Other

## 2018-06-14 ENCOUNTER — Encounter (HOSPITAL_COMMUNITY): Payer: Self-pay | Admitting: Cardiac Rehabilitation

## 2018-06-14 ENCOUNTER — Telehealth (HOSPITAL_COMMUNITY): Payer: Self-pay | Admitting: Cardiac Rehabilitation

## 2018-06-14 DIAGNOSIS — I5022 Chronic systolic (congestive) heart failure: Secondary | ICD-10-CM

## 2018-06-14 NOTE — Telephone Encounter (Signed)
Phone call to pt to advise of outpatient cardiac rehab departmental closing additional 30 days for COVID 19 precautions.  Left message on answering machine.  Andi Hence, RN, BSN Cardiac Pulmonary Rehab

## 2018-06-14 NOTE — Progress Notes (Addendum)
Cardiac Individual Treatment Plan  Patient Details  Name: Caleb KOEPP, PhD MRN: [id number] Date of Birth: [birth year] Referring Provider:   Flowsheet Row CARDIAC REHAB PHASE II ORIENTATION from 06/02/2018 in [place]  Referring Provider  Loralie Champagne, MD.      Initial Encounter Date:  Flowsheet Row CARDIAC REHAB PHASE II ORIENTATION from 06/02/2018 in [place] [place]  Date  06/02/18      Visit Diagnosis: 40/9811 Chronic systolic congestive heart failure ([place])  Patient's Home Medications on Admission:  Current Outpatient Medications:  .  aspirin EC 81 MG tablet, Take 1 tablet (81 mg total) by mouth daily., Disp: 30 tablet, Rfl: 3 .  eplerenone (INSPRA) 25 MG tablet, TAKE 1 TABLET BY MOUTH ONCE DAILY, Disp: 30 tablet, Rfl: 11 .  magnesium gluconate (MAGONATE) 500 MG tablet, Take 500 mg by mouth 2 (two) times daily. , Disp: , Rfl:  .  Omega-3 Fatty Acids (FISH OIL PO), Take 360 mg by mouth daily. , Disp: , Rfl:  .  potassium chloride (K-DUR,KLOR-CON) 20 MEQ tablet, Take 2.5 tablets (50 mEq total) by mouth daily., Disp: 225 tablet, Rfl: 3 .  torsemide (DEMADEX) 20 MG tablet, Take 3 tablets (60 mg total) by mouth daily. Alternate 34m (3 tabs) and 813m(4 tabs) every other day, Disp: 270 tablet, Rfl: 6 .  valsartan (DIOVAN) 40 MG tablet, TAKE 1 AND 1/2 TABLETS BY MOUTH IN THE MORNING AND 2 TABLETS IN THE EVENING (Patient taking differently: Take 40 mg by mouth daily. Pt taking 4058mn the morning and 20m61m the evening), Disp: 105 tablet, Rfl: 3  Past Medical History: Past Medical History:  Diagnosis Date  . Allergic rhinitis   . Atrial fibrillation (HCC)[place]. Atypical pneumonia   . CAD (coronary artery disease)   . Cardiomyopathy, ischemic   . Chronic anticoagulation   . Cough   . Dizziness   . GERD (gastroesophageal reflux disease)   . Heart failure, systolic, acute on chronic (HCC)[place]. Hyperlipidemia   .  Hypertension   . OSA (obstructive sleep apnea)    Home sleep test 07/05/2009 AHI 8.2  . Pleural effusion   . Positional vertigo   . TIA (transient ischemic attack)     Tobacco Use: Social History   Tobacco Use  Smoking Status Never Smoker  Smokeless Tobacco Never Used    Labs: Recent Review Flowsheet Data    Labs for ITP Cardiac and Pulmonary Rehab Latest Ref Rng & Units 01/10/2016 01/10/2016 08/17/2017 10/07/2017 11/16/2017   Cholestrol 0 - 200 mg/dL - - 212(H) 206(H) -   LDLCALC 0 - 99 mg/dL - - 140(H) 136(H) -   LDLDIRECT mg/dL - - - - -   HDL >40 mg/dL - - 55 53 -   Trlycerides <150 mg/dL - - 86 84 -   Hemoglobin A1c 4.8 - 5.6 % - - 5.5 - -   PHART 7.350 - 7.450 - - - - -   PCO2ART 35.0 - 45.0 mmHg - - - - -   HCO3 20.0 - 28.0 mmol/L 26.7 26.1 - - 24.8   TCO2 22 - 32 mmol/L 28 27 - - 26   ACIDBASEDEF 0.0 - 2.0 mmol/L - - - - -   O2SAT % 68.0 67.0 - - 69.0      Capillary Blood Glucose: No results found for: GLUCAP   Exercise Target Goals: Exercise Program Goal: Individual exercise prescription set  using results from initial 6 min walk test and THRR while considering  patient's activity barriers and safety.   Exercise Prescription Goal: Initial exercise prescription builds to 30-45 minutes a day of aerobic activity, 2-3 days per week.  Home exercise guidelines will be given to patient during program as part of exercise prescription that the participant will acknowledge.  Activity Barriers & Risk Stratification: Activity Barriers & Cardiac Risk Stratification - 06/02/18 1015    Activity Barriers & Cardiac Risk Stratification          Activity Barriers  None    Cardiac Risk Stratification  High           6 Minute Walk: 6 Minute Walk    6 Minute Walk    Row Name 06/02/18 1013   Phase  Initial   Distance  1634 feet   Walk Time  6 minutes   # of Rest Breaks  0   MPH  3.1   METS  3.3   RPE  13   Perceived Dyspnea   0   VO2 Peak  11.64   Symptoms  No    Resting HR  84 bpm   Resting BP  110/60   Resting Oxygen Saturation   99 %   Exercise Oxygen Saturation  during 6 min walk  99 %   Max Ex. HR  101 bpm   Max Ex. BP  126/62   2 Minute Post BP  118/72          Oxygen Initial Assessment:   Oxygen Re-Evaluation:   Oxygen Discharge (Final Oxygen Re-Evaluation):   Initial Exercise Prescription: Initial Exercise Prescription - 06/02/18 1000    Date of Initial Exercise RX and Referring Provider          Date  06/02/18    Referring Provider  Loralie Champagne, MD.    Expected Discharge Date  09/07/18        Recumbant Bike          Level  1.5    Watts  25    Minutes  10    METs  3.12        NuStep          Level  2    SPM  85    Minutes  10    METs  3        Track          Laps  13    Minutes  10    METs  3.3        Prescription Details          Frequency (times per week)  3x    Duration  Progress to 30 minutes of continuous aerobic without signs/symptoms of physical distress        Intensity          THRR 40-80% of Max Heartrate  56-113    Ratings of Perceived Exertion  11-13    Perceived Dyspnea  0-4        Progression          Progression  Continue progressive overload as per policy without signs/symptoms or physical distress.        Resistance Training          Training Prescription  Yes    Weight  3lbs    Reps  10-15           Perform Capillary Blood Glucose checks as needed.  Exercise Prescription  Changes:  Exercise Prescription Changes    Home Exercise Plan    [place] Name 06/07/18 1500   Plans to continue exercise at  Home (comment)   Frequency  Add 3 additional days to program exercise sessions.   Initial Home Exercises Provided  06/07/18          Exercise Comments:  Exercise Comments    Row Name 06/07/18 1526 06/14/18 1106   Exercise Comments  Reviewed HEP with Pt. Pt was responsive and understands goals.   pt currently on hold for COVID 19 precautions.        Exercise  Goals and Review:  Exercise Goals    Exercise Goals    Row Name 06/02/18 1015   Increase Physical Activity  Yes   Intervention  Provide advice, education, support and counseling about physical activity/exercise needs.;Develop an individualized exercise prescription for aerobic and resistive training based on initial evaluation findings, risk stratification, comorbidities and participant's personal goals.   Expected Outcomes  Short Term: Attend rehab on a regular basis to increase amount of physical activity.;Long Term: Exercising regularly at least 3-5 days a week.;Long Term: Add in home exercise to make exercise part of routine and to increase amount of physical activity.   Able to understand and use rate of perceived exertion (RPE) scale  Yes   Intervention  Provide education and explanation on how to use RPE scale   Expected Outcomes  Short Term: Able to use RPE daily in rehab to express subjective intensity level;Long Term:  Able to use RPE to guide intensity level when exercising independently   Knowledge and understanding of Target Heart Rate Range (THRR)  Yes   Intervention  Provide education and explanation of THRR including how the numbers were predicted and where they are located for reference   Expected Outcomes  Long Term: Able to use THRR to govern intensity when exercising independently;Short Term: Able to state/look up THRR;Short Term: Able to use daily as guideline for intensity in rehab   Able to check pulse independently  Yes   Intervention  Provide education and demonstration on how to check pulse in carotid and radial arteries.;Review the importance of being able to check your own pulse for safety during independent exercise   Expected Outcomes  Short Term: Able to explain why pulse checking is important during independent exercise;Long Term: Able to check pulse independently and accurately   Understanding of Exercise Prescription  Yes   Intervention  Provide education,  explanation, and written materials on patient's individual exercise prescription   Expected Outcomes  Short Term: Able to explain program exercise prescription;Long Term: Able to explain home exercise prescription to exercise independently          Exercise Goals Re-Evaluation : Exercise Goals Re-Evaluation    Exercise Goal Re-Evaluation    Row Name 06/07/18 1525   Exercise Goals Review  Increase Physical Activity;Able to understand and use rate of perceived exertion (RPE) scale;Knowledge and understanding of Target Heart Rate Range (THRR);Understanding of Exercise Prescription;Increase Strength and Stamina   Comments  Reviewed HEP with Pt. Pt was responsive and understands THRR, RPE scale, weather precautions, end points of exericse, and NTG use. Pt is walking at home 30-45 minutes 5-7 days a week in addition to Cardiac Rehab.    Expected Outcomes  Will continue to monitor and progress Pt as tolerated.           Discharge Exercise Prescription (Final Exercise Prescription Changes): Exercise Prescription Changes - 06/07/18 1500  Home Exercise Plan          Plans to continue exercise at  Home (comment)    Frequency  Add 3 additional days to program exercise sessions.    Initial Home Exercises Provided  06/07/18           Nutrition:  Target Goals: Understanding of nutrition guidelines, daily intake of sodium <1512m, cholesterol <2051m calories 30% from fat and 7% or less from saturated fats, daily to have 5 or more servings of fruits and vegetables.  Biometrics: Pre Biometrics - 06/02/18 1014    Pre Biometrics          Height  _0  (1.753 m)    Weight  150 lb 2.1 oz (68.1 kg)    Waist Circumference  34.5 inches    Hip Circumference  37.5 inches    Waist to Hip Ratio  0.92 %    BMI (Calculated)  22.16    Triceps Skinfold  18 mm    % Body Fat  24 %    Grip Strength  34 kg    Flexibility  0 in    Single Leg Stand  1.85 seconds            Nutrition Therapy Plan  and Nutrition Goals: Nutrition Therapy & Goals - 06/02/18 1114    Nutrition Therapy          Diet  heart healthy        Personal Nutrition Goals          Nutrition Goal  Pt to identify and limit food sources of sodium, saturated fat, trans fat, and refined carbohydrates        Intervention Plan          Intervention  Prescribe, educate and counsel regarding individualized specific dietary modifications aiming towards targeted core components such as weight, hypertension, lipid management, diabetes, heart failure and other comorbidities.    Expected Outcomes  Short Term Goal: Understand basic principles of dietary content, such as calories, fat, sodium, cholesterol and nutrients.;Long Term Goal: Adherence to prescribed nutrition plan.           Nutrition Assessments: Nutrition Assessments - 06/02/18 1114    MEDFICTS Scores          Pre Score  15           Nutrition Goals Re-Evaluation:   Nutrition Goals Re-Evaluation:   Nutrition Goals Discharge (Final Nutrition Goals Re-Evaluation):   Psychosocial: Target Goals: Acknowledge presence or absence of significant depression and/or stress, maximize coping skills, provide positive support system. Participant is able to verbalize types and ability to use techniques and skills needed for reducing stress and depression.  Initial Review & Psychosocial Screening: Initial Psych Review & Screening - 06/02/18 1118    Initial Review          Current issues with  --   Dr GrBerenice Primasas his spouse and family members [place]        Barriers          Psychosocial barriers to participate in program  There are no identifiable barriers or psychosocial needs.        Screening Interventions          Interventions  Encouraged to exercise           Quality of Life Scores: Quality of Life -  06/02/18 1019    Quality of Life          Select  Quality of Life         Quality of Life Scores          Health/Function Pre  12.9 %    Socioeconomic Pre  28.75 %    Psych/Spiritual Pre  19.67 %    Psych/Spiritual Post  24 %    Psych/Spiritual % Change  22.01 %    Family Post  18.88 %          Scores of 19 and below usually indicate a poorer quality of life in these areas.  A difference of  2-3 points is a clinically meaningful difference.  A difference of 2-3 points in the total score of the Quality of Life Index has been associated with significant improvement in overall quality of life, self-image, physical symptoms, and general health in studies assessing change in quality of life.  PHQ-9: Recent Review Flowsheet Data    Depression screen [facility] 2/9 03/29/2018 01/19/2018 02/27/2016   Decreased Interest 0 0 0   Down, Depressed, Hopeless 0 0 0   PHQ - 2 Score 0 0 0     Interpretation of Total Score  Total Score Depression Severity:  1-4 = Minimal depression, 5-9 = Mild depression, 10-14 = Moderate depression, 15-19 = Moderately severe depression, 20-27 = Severe depression   Psychosocial Evaluation and Intervention:   Psychosocial Re-Evaluation:   Psychosocial Discharge (Final Psychosocial Re-Evaluation):   Vocational Rehabilitation: Provide vocational rehab assistance to qualifying candidates.   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 Participant will state understanding/return demonstration of topics presented.  Learning Barriers/Preferences: Learning Barriers/Preferences - 06/02/18 1018    Learning Barriers/Preferences          Learning Barriers  None    Learning Preferences   Skilled Demonstration;Individual Instruction;Written Material           Education Topics: Count Your Pulse:  -Group instruction provided by verbal instruction, demonstration, patient participation and written materials to support subject.  Instructors address importance of being able to find your pulse and how to count your pulse when at home without a heart monitor.  Patients get hands on experience counting their pulse with staff help and individually.   Heart Attack, Angina, and Risk Factor Modification:  -Group instruction provided by verbal instruction, video, and written materials to support subject.  Instructors address signs and symptoms of angina and heart attacks.    Also discuss risk factors for heart disease and how to make changes to improve heart health risk factors.   Functional Fitness:  -Group instruction provided by verbal instruction, demonstration, patient participation, and written materials to support subject.  Instructors address safety measures for doing things around the house.  Discuss how to get up and down off the floor, how to pick things up properly, how to safely get out of a chair without assistance, and balance training.   Meditation and Mindfulness:  -Group instruction provided by verbal instruction, patient participation, and written materials to support subject.  Instructor  addresses importance of mindfulness and meditation practice to help reduce stress and improve awareness.  Instructor also leads participants through a meditation exercise.    Stretching for Flexibility and Mobility:  -Group instruction provided by verbal instruction, patient participation, and written materials to support subject.  Instructors lead participants through series of stretches that are designed to increase flexibility thus improving mobility.  These stretches are additional exercise for major muscle groups that are typically performed during regular warm up and cool  down.   Hands Only CPR:  -Group verbal, video, and participation provides a basic overview of AHA guidelines for community CPR. Role-play of emergencies allow participants the opportunity to practice calling for help and chest compression technique with discussion of AED use.   Hypertension: -Group verbal and written instruction that provides a basic overview of hypertension including the most recent diagnostic guidelines, risk factor reduction with self-care instructions and medication management.    Nutrition I class: Heart Healthy Eating:  -Group instruction provided by PowerPoint slides, verbal discussion, and written materials to support subject matter. The instructor gives an explanation and review of the Therapeutic Lifestyle Changes diet recommendations, which includes a discussion on lipid goals, dietary fat, sodium, fiber, plant stanol/sterol esters, sugar, and the components of a well-balanced, healthy diet.   Nutrition II class: Lifestyle Skills:  -Group instruction provided by PowerPoint slides, verbal discussion, and written materials to support subject matter. The instructor gives an explanation and review of label reading, grocery shopping for heart health, heart healthy recipe modifications, and ways to make healthier choices when eating out.   Diabetes Question & Answer:  -Group instruction provided by PowerPoint slides, verbal discussion, and written materials to support subject matter. The instructor gives an explanation and review of diabetes co-morbidities, pre- and post-prandial blood glucose goals, pre-exercise blood glucose goals, signs, symptoms, and treatment of hypoglycemia and hyperglycemia, and foot care basics.   Diabetes Blitz:  -Group instruction provided by PowerPoint slides, verbal discussion, and written materials to support subject matter. The instructor gives an explanation and review of the physiology behind type 1 and type 2 diabetes, diabetes  medications and rational behind using different medications, pre- and post-prandial blood glucose recommendations and Hemoglobin A1c goals, diabetes diet, and exercise including blood glucose guidelines for exercising safely.    Portion Distortion:  -Group instruction provided by PowerPoint slides, verbal discussion, written materials, and food models to support subject matter. The instructor gives an explanation of serving size versus portion size, changes in portions sizes over the last 20 years, and what consists of a serving from each food group.   Stress Management:  -Group instruction provided by verbal instruction, video, and written materials to support subject matter.  Instructors review role of stress in heart disease and how to cope with stress positively.     Exercising on Your Own:  -Group instruction provided by verbal instruction, power point, and written materials to support subject.  Instructors discuss benefits of exercise, components of exercise, frequency and intensity of exercise, and end points for exercise.  Also discuss use of nitroglycerin and activating EMS.  Review options of places to exercise outside of rehab.  Review guidelines for sex with heart disease.   Cardiac Drugs I:  -Group instruction provided by verbal instruction and written materials to support subject.  Instructor reviews cardiac drug classes: antiplatelets, anticoagulants, beta blockers, and statins.  Instructor discusses reasons, side effects, and lifestyle considerations for each drug class.   Cardiac Drugs II:  -Group instruction provided  by verbal instruction and written materials to support subject.  Instructor reviews cardiac drug classes: angiotensin converting enzyme inhibitors (ACE-I), angiotensin II receptor blockers (ARBs), nitrates, and calcium channel blockers.  Instructor discusses reasons, side effects, and lifestyle considerations for each drug class.   Anatomy and Physiology of the  Circulatory System:  Group verbal and written instruction and models provide basic cardiac anatomy and physiology, with the coronary electrical and arterial systems. Review of: AMI, Angina, Valve disease, Heart Failure, Peripheral Artery Disease, Cardiac Arrhythmia, Pacemakers, and the ICD.   Other Education:  -Group or individual verbal, written, or video instructions that support the educational goals of the cardiac rehab program.   Holiday Eating Survival Tips:  -Group instruction provided by PowerPoint slides, verbal discussion, and written materials to support subject matter. The instructor gives patients tips, tricks, and techniques to help them not only survive but enjoy the holidays despite the onslaught of food that accompanies the holidays.   Knowledge Questionnaire Score: Knowledge Questionnaire Score - 06/02/18 1018    Knowledge Questionnaire Score          Pre Score  21/24           Core Components/Risk Factors/Patient Goals at Admission: Personal Goals and Risk Factors at Admission - 06/02/18 1019    Core Components/Risk Factors/Patient Goals on Admission           Weight Management  Weight Gain;Weight Maintenance    Improve shortness of breath with ADL's  Yes    Intervention  Provide education, individualized exercise plan and daily activity instruction to help decrease symptoms of SOB with activities of daily living.    Expected Outcomes  Short Term: Improve cardiorespiratory fitness to achieve a reduction of symptoms when performing ADLs;Long Term: Be able to perform more ADLs without symptoms or delay the onset of symptoms    Heart Failure  Yes    Intervention  Provide a combined exercise and nutrition program that is supplemented with education, support and counseling about heart failure. Directed toward relieving symptoms such as shortness of breath, decreased exercise tolerance, and extremity edema.    Expected Outcomes  Improve functional capacity of life;Short  term: Attendance in program 2-3 days a week with increased exercise capacity. Reported lower sodium intake. Reported increased fruit and vegetable intake. Reports medication compliance.;Short term: Daily weights obtained and reported for increase. Utilizing diuretic protocols set by physician.;Long term: Adoption of self-care skills and reduction of barriers for early signs and symptoms recognition and intervention leading to self-care maintenance.    Lipids  Yes    Intervention  Provide education and support for participant on nutrition & aerobic/resistive exercise along with prescribed medications to achieve LDL <21m, HDL >48m    Expected Outcomes  Short Term: Participant states understanding of desired cholesterol values and is compliant with medications prescribed. Participant is following exercise prescription and nutrition guidelines.;Long Term: Cholesterol controlled with medications as prescribed, with individualized exercise RX and with personalized nutrition plan. Value goals: LDL < 7056mHDL > 40 mg.    Stress  Yes    Intervention  Offer individual and/or small group education and counseling on adjustment to heart disease, stress management and health-related lifestyle change. Teach and support self-help strategies.;Refer participants experiencing significant psychosocial distress to appropriate mental health specialists for further evaluation and treatment. When possible, include family members and significant others in education/counseling sessions.    Expected Outcomes  Short Term: Participant demonstrates changes in health-related behavior, relaxation and other stress management skills, ability  to obtain effective social support, and compliance with psychotropic medications if prescribed.;Long Term: Emotional wellbeing is indicated by absence of clinically significant psychosocial distress or social isolation.           Core Components/Risk Factors/Patient Goals Review:    Core  Components/Risk Factors/Patient Goals at Discharge (Final Review):    ITP Comments: ITP Comments    Row Name 06/02/18 1013 06/14/18 1104   ITP Comments  Dr. Fransico Him, Medical Director   30 day ITP.  pt currently on hold for COVID 19 precautions.        Comments:  Andi Hence, RN, BSN Cardiac Pulmonary Rehab

## 2018-06-15 ENCOUNTER — Ambulatory Visit (HOSPITAL_COMMUNITY): Payer: Medicare Other

## 2018-06-16 ENCOUNTER — Ambulatory Visit (HOSPITAL_COMMUNITY)
Admission: RE | Admit: 2018-06-16 | Discharge: 2018-06-16 | Disposition: A | Discharge status: Home / Self Care | Payer: Medicare Other | Source: Ambulatory Visit | Attending: Cardiology | Admitting: Cardiology

## 2018-06-16 ENCOUNTER — Other Ambulatory Visit: Payer: Self-pay

## 2018-06-16 DIAGNOSIS — I5022 Chronic systolic (congestive) heart failure: Secondary | ICD-10-CM

## 2018-06-16 LAB — BASIC METABOLIC PANEL
Anion gap: 9 (ref 5–15)
BUN: 23 mg/dL (ref 8–23)
CO2: 30 mmol/L (ref 22–32)
Calcium: 9.3 mg/dL (ref 8.9–10.3)
Chloride: 96 mmol/L — ABNORMAL LOW (ref 98–111)
Creatinine, Ser: 1.12 mg/dL (ref 0.61–1.24)
GFR calc Af Amer: >60 mL/min (ref >60)
GFR calc non Af Amer: >60 mL/min (ref >60)
GLUCOSE: 83 mg/dL (ref 70–99)
Potassium: 3.6 mmol/L (ref 3.5–5.1)
Sodium: 135 mmol/L (ref 135–145)

## 2018-06-20 ENCOUNTER — Ambulatory Visit (HOSPITAL_COMMUNITY): Payer: Medicare Other

## 2018-06-22 ENCOUNTER — Ambulatory Visit (HOSPITAL_COMMUNITY): Payer: Medicare Other

## 2018-06-27 ENCOUNTER — Ambulatory Visit (HOSPITAL_COMMUNITY): Payer: Medicare Other

## 2018-06-29 ENCOUNTER — Ambulatory Visit (HOSPITAL_COMMUNITY): Payer: Medicare Other

## 2018-06-29 MED FILL — VALSARTAN 40 MG TABLET: 40 | 30 days supply | Qty: 105 | Fill #2

## 2018-06-30 ENCOUNTER — Telehealth (HOSPITAL_COMMUNITY): Payer: Self-pay | Admitting: Cardiac Rehabilitation

## 2018-06-30 NOTE — Telephone Encounter (Signed)
Pt phone call to inform of continued Outpatient Cardiac Rehab departmental closure for COVID 19 precautions. Future opening date to be determined. Pt instructed to continue exercising on his own following home exercise guidelines. Heis continuing to walk on her own. Pt advised to contact cardiology or PCP PRN symptoms, questions or concerns. Understanding verbalized. Pt denies food insecurity or other needs at this time. Pt offered emotional support and understanding. Pt expressed gratitude for the call and is interested in resuming CRPII when available.  Andi Hence, RN, BSN Cardiac Pulmonary Rehab

## 2018-07-04 ENCOUNTER — Ambulatory Visit (HOSPITAL_COMMUNITY): Payer: Medicare Other

## 2018-07-06 ENCOUNTER — Ambulatory Visit (HOSPITAL_COMMUNITY): Payer: Medicare Other

## 2018-07-11 ENCOUNTER — Ambulatory Visit (HOSPITAL_COMMUNITY): Payer: Medicare Other

## 2018-07-13 ENCOUNTER — Ambulatory Visit (HOSPITAL_COMMUNITY): Payer: Medicare Other

## 2018-07-14 ENCOUNTER — Ambulatory Visit: Payer: Medicare Other | Admitting: Pulmonary Disease

## 2018-07-15 ENCOUNTER — Ambulatory Visit: Payer: Medicare Other | Admitting: Pulmonary Disease

## 2018-07-18 ENCOUNTER — Ambulatory Visit (HOSPITAL_COMMUNITY): Payer: Medicare Other

## 2018-07-20 ENCOUNTER — Ambulatory Visit (HOSPITAL_COMMUNITY): Payer: Medicare Other

## 2018-07-21 ENCOUNTER — Telehealth: Payer: Self-pay | Admitting: Cardiology

## 2018-07-21 NOTE — Telephone Encounter (Signed)
° ° °  Patient states he was called today to schedule an appointment. Patient declined to schedule at this time.

## 2018-07-25 ENCOUNTER — Ambulatory Visit (HOSPITAL_COMMUNITY): Payer: Medicare Other

## 2018-07-27 ENCOUNTER — Ambulatory Visit (HOSPITAL_COMMUNITY): Payer: Medicare Other

## 2018-08-01 ENCOUNTER — Ambulatory Visit (HOSPITAL_COMMUNITY): Payer: Medicare Other

## 2018-08-01 MED FILL — EPLERENONE 25 MG TABLET: 25 | 30 days supply | Qty: 30 | Fill #4

## 2018-08-03 ENCOUNTER — Ambulatory Visit (HOSPITAL_COMMUNITY): Payer: Medicare Other

## 2018-08-08 ENCOUNTER — Ambulatory Visit (HOSPITAL_COMMUNITY): Payer: Medicare Other

## 2018-08-10 ENCOUNTER — Ambulatory Visit (HOSPITAL_COMMUNITY): Payer: Medicare Other

## 2018-08-11 ENCOUNTER — Encounter (HOSPITAL_COMMUNITY): Payer: Self-pay

## 2018-08-11 ENCOUNTER — Encounter (HOSPITAL_COMMUNITY): Payer: Medicare Other | Admitting: Cardiology

## 2018-08-11 ENCOUNTER — Other Ambulatory Visit: Payer: Self-pay

## 2018-08-11 ENCOUNTER — Ambulatory Visit (HOSPITAL_COMMUNITY)
Admission: RE | Admit: 2018-08-11 | Discharge: 2018-08-11 | Disposition: A | Discharge status: Home / Self Care | Payer: Medicare Other | Source: Ambulatory Visit | Attending: Cardiology | Admitting: Cardiology

## 2018-08-11 DIAGNOSIS — I5022 Chronic systolic (congestive) heart failure: Secondary | ICD-10-CM

## 2018-08-11 DIAGNOSIS — I480 Paroxysmal atrial fibrillation: Secondary | ICD-10-CM

## 2018-08-11 MED ORDER — VALSARTAN 40 MG PO TABS: 40.0000 mg | ORAL_TABLET | Freq: Every day | ORAL | 3 refills | Status: DC

## 2018-08-11 NOTE — Progress Notes (Signed)
Heart Failure TeleHealth Note  Due to national recommendations of social distancing due to [place] 19, Audio/video telehealth visit is felt to be most appropriate for this patient at this time.  See MyChart message from today for patient consent regarding telehealth for [facility].  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     he denies symptoms worrisome for COVID 19.   Patient had anterior MI in 2004 and developed ischemic cardiomyopathy as well as mitral regurgitation. He also developed atrial fibrillation.  In 10/14, he had MV repair, Maze, CABG with LIMA-LAD, and LA appendage closure at [facility] in [place].  Subsequently, atrial fibrillation returned and he had an atrial fibrillation ablation in [facility] in 3/15.  He wore a Zio patch in 12/15 and had a low atrial fibrillation burden of 7%.  He has had a long-standing ischemic cardiomyopathy.  In 2015, EF was 20-25%.  Echo in 2016 also showed EF 25-30% but estimated PA pressure suggested severe pulmonary hypertension.     At initial appointment, he reported increased exertional dyspnea over a number of weeks.  RHC was done, showing mildly elevated PCWP with moderate pulmonary HTN and low cardiac index (1.93 thermo, 2.13 Fick).  V/Q scan showed no PE.  I started him on digoxin and have titrated his Lasix to 80 mg daily.  He is now on bisoprolol and able to tolerate it.  At last appointment, I had him try replacing valsartan with Entresto 24/26 bid.  He was unable to tolerate it (made him dizzy, though BP was not low).  Therefore, I had him restart  valsartan.  CPX in 4/16 showed mildly decreased functional capacity.  He has tolerated Adcirca 20 mg daily.  He did not tolerate an attempt to uptitrate bisoprolol.  He tried CPAP but was unable to tolerate it.  He was unable to tolerate eplerenone 50 mg daily.  Last echo in 9/17 showed EF 20-25%, stable MV repair, normal RV, and PA systolic pressure 86 mmHg.   He had a Lexiscan Cardiolite in 9/17 that showed infarction, no ischemia.  [place] in 10/17 showed severe mixed pulmonary venous HTN/pulmonary arterial HTN with PVR 4.3 WU and PCWP mildly elevated.  Adcirca was increased to 40 mg daily.   At a prior appointment, he was having more palpitations.  Event monitor in 11/17 showed atrial fibrillation episodes, these were symptomatic.  Since then, the atrial fibrillation seems to have decreased.  He saw [name] to discuss Tikosyn initiation.  No decision was reached at that time, and since palpitations have decreased again, he wants to hold off on Tikosyn for now.   He has OSA.  He cannot tolerate CPAP but is using his oral appliance.    HR was lower in 5/18.  He came into the office with HR around 40.  ECG showed an ectopic atrial rhythm with very small P waves. I stopped bisoprolol and later digoxin.  HR has gone up since.  He wore an event monitor in 6/18 showing rare 3-5 second pauses at night only, rare atrial fibrillation, and rare junctional  bradycardia.  He wore an event monitor again in 4/19, this showed rare afib (1% total) with nocturnal bradycardia and 1 nocturnal 3 second pause.  No concerning findings.   He stopped Adcirca as his insurance was no longer covering it.    He saw [name], and it was decided that he would be unlikely to improve much with CRT with his current IVCD (not true LBBB).   Echo (7/19) is comparable to the past with mildly dilated LV, EF 25% with wall motion abnormalities, mild RV dilation/mild decreased function, mild to moderate AI, stable repaired mitral  valve, PASP 76 mmHg.     Given worsening symptoms, he had RHC in 8/19.  This showed evidence for volume overload with severe pulmonary arterial hypertension, PVR 5.8 WU and preserved cardiac output. Torsemide was increased.    He was started on Opsumit.  He says that it helped his breathing but he was unable to continue it due to considerable worsening of his peripheral edema.  He has stopped Opsumit.  We discussed Selexipag but he has decided he does not want to try it.   He has been doing well for the last month.  Walking 30 minutes/day without significant exertional dyspnea.  Weight stable.  No edema. No chest pain.  No orthopnea/PND. Occasional palpitations, no lightheadedness. He is only taking valsartan 40 mg bid, says he decreased the dose because his "BP is doing so well."   6 minute walk (3/16): 381 m  6 minute walk (5/16): 414.5 m 6 minute walk (10/16): 562 m 6 minute walk (1/17): 469 m 6 minute walk (8/17): 488 m 6 minute walk (6/18): 549 m 6 minute walk (11/18): 366 m 6 minute walk (9/19): 518 m  Labs (2/15): LDL 144 Labs (8/15): K 4.6, creatinine 0.9 Labs (12/15): HCT 42.3   Labs (2/16): K 4 => 4.2, creatinine 1.05 => 0.92, BNP 268 Labs (3/16): BNP 495 => 320, digoxin 0.4, RF 14.7 (very mild increase), TSH normal, HIV negative, anti-SCL70 negative, creatinine 0.91, K 4.4 Labs (7/16): K 5, creatinine 1.05, BNP 455, vitamin D normal, digoxin 0.4, B12 normal Labs (8/16): digoxin 0.8, BNP 305, K 4.2, creatinine 0.99 Labs (9/16): HCT 43.3, TSH normal, BNP 492 => 278, K 4.3, creatinine 0.97 => 1.04, digoxin 0.6, TSH normal, LDL 154, LDL-P 1654.  Labs (10/16): K 4.7, creatinine 1.1 Labs (1/17): pro-BNP 2065, digoxin 0.3, K 4.3, creatinine 1.10 => 1.28 Labs (3/17): K 4.1, creatinine 1.09, HCT 38.3 Labs (4/17): K 4.4, creatinine 0.99, digoxin 0.8 Labs (5/17): K 4.3, creatinine 1.08, BNP 317, hgb 13.1 Labs (8/17): K 4.6, creatinine 0.99, BNP 392, digoxin 0.5 Labs (9/17): K 4.4,  creatinine 1.05 Labs (10/17): digoxin 0.5, K 4.1, creatinine 1.12, HCT 44.9, digoxin 0.5 Labs (11/17): K 4 => 3.8, creatinine 1.3 => 1.16, digoxin 0.5, BNP 296 Labs (12/17): K 4.1, creatinine 1.15, BNP 294, digoxin 0.7 Labs (2/18): LDL 135, Lp(a) 124, LDL-P 979  Labs (5/18): K 4.3, creatinine 1.07 => 0.95, BNP 224, hgb 14.4, digoxin 0.6 Labs (6/18): K 4.4, creatinine 1.08, hgb 14 Labs (8/18): K 4.1, creatinine 1.05 Labs (12/18): K 3.7, creatinine 0.94 Labs (2/19): K 4.1, creatinine 0.99, BNP 255 Labs (5/19): K 4, creatinine 0.98, LDL 140 Labs (7/19): LDL 136 Labs (9/19): K 4.1, creatinine 0.91 Labs (12/19): K 3.6, creatinine 1.18, BNP 479, hgb 12.9 Labs (3/20): K 3.4, creatinine 1.14 => 1.12  PMH: 1. CAD: Anterior MI in 2004.  Cardiac surgery in 10/14 included LIMA-LAD.  -  Lexiscan Cardiolite (9/17): EF 35%, infarct present with no ischemia.  2. Chronic mitral regurgitation: 10/14 surgery at [facility] with MV repair, Maze, LIMA-LAD, and LA appendage closure.  3. Atrial fibrillation: Paroxysmal.  He was initially on Tikosyn but had breakthrough atrial fibrillation.  H/o Maze in 2014.  Had recurrent atrial fibrillation with ablation in 3/15 by Dr Ola Spurr in [facility].  Not anticoagulated after 2 severe prostate bleeding episodes.  LA appendage was oversewn with MV surgery.  - Zio patch in 12/15 with low atrial fibrillation burden (7%).  - Holter (9/16) with PACs, PVCs, short atrial fibrillation runs (nothing sustained). - Event monitor (11/17) with runs of atrial fibrillation and flutter.   - Event monitor (6/18) with rare atrial fibrillation - Event monitor 4/19 showing rare afib (1% total) with nocturnal bradycardia and 1 nocturnal 3 second pause.  No concerning findings.  4. Chronotropic incompetence.  5. HTN 6. Hyperlipidemia: Refuses statin.  7. Peripheral neuropathy 8. GERD 9. H/o BPPV 10. H/o TIA 11. Ischemic cardiomyopathy: cardiac MRI 7/14 with EF 35%, moderate MR,  normal RV size and systolic function, extensive anterior and anteroseptal LGE suggestive of non-viable myocardium (this was prior to LIMA-LAD).  Echo 1/15 with EF 20-25%, moderate AI, PA systolic pressure 39 mmHg. Echo (1/16) with EF 20-25%, diffuse hypokinesis with regionality, moderate LV dilation, moderate AI, s/p MV repair with mild MR and normal gradients, RV dilated with mildly decreased systolic function, PA systolic pressure 71 mmHg.   - RHC (2/16) with mean RA 9, PA 61/25 mean 40, mean PCWP 23, CI 2.13/PVR 4.3 (Fick), CI 1.93/PVR 4.7 (thermo).   - CPX (4/16) with peak VO2 17.9, VE/VCO2 34.7 => mildly decreased functional capacity.  - Spironolactone apparently caused cognitive deficits - Intolerant of Coreg due to development of severe alopecia - Unable to uptitrate bisoprolol due to intolerance.  - Lightheaded with Entresto.  - Unable to tolerate increase in valsartan to 80 mg bid.  - Echo (1/17) with EF 30-35%, moderate LV dilation, mild AI, s/p MV repair with mild MR, moderately dilated RV with mildly decreased systolic function, PA systolic pressure 69 mmHg.  - CPX (4/17): peak VO2 18, VE/VCO2 slope 33, RER 1.28 => mild to moderate functional impairment, mildly improved.  - Echo (9/17): EF 20-25% with regional WMAs, normal RV size and systolic function, PASP 86 mmHg, stable repaired mitral valve with mild MR, moderate AI.  - RHC (10/17): mean RA 9, PA 70/23 mean 43, PCWP mean 20, CI 2.96 Fick/2.84 Thermo, PVR 4.3 WU.  - Echo (9/18): severe LV dilation, EF 20-25% with WMAs, s/p MV repair with mild MR, moderate AI, moderate TR, PASP 68 mmHg, normal RV size and systolic function.  - Echo (7/19): mildly dilated LV, EF 25% with wall motion abnormalities, mild RV dilation/mild decreased function, mild to moderate AI, stable repaired mitral valve (no MS, mild MR), PASP 76 mmHg.   - CPX (8/19): peak VO2 18.9, VE/VCO2 slope 40, RER 1.09 => moderate HF limitation.  - RHC (8/19): mean RA 14, PA 75/28  mean 47, mean PCWP 24, CI 2.44/PVR 5 Fick, CI 2.13/PVR 5.8 thermo.  12. Carotid stenosis: Carotid dopplers (1/16) with 40-59% bilateral ICA stenosis. Carotid dopplers (4/17) with 40-59% BICA stenosis.  - Carotid dopplers (2/18) with < 50% BICA stenosis.  13. Aortic insufficiency: Moderate by last echo 9/17.  14. Pulmonary HTN: Mixed PAH and pulmonary venous hypertension.  PFTs (9/14) were normal.  V/Q scan (2/16) with no evidence of acute or chronic  PE.   - Unable to tolerate Opsumit due to extensive peripheral edema.  15. OSA: Moderate on 5/16 sleep study. Unable to tolerate CPAP.  Repeat sleep study at [facility] in 2/18 was also suggestive of OSA.  16. Venous insufficiency 17. Ventral hernia 18. Bradycardia: Holter (5/18) with overnight pauses up to 4.7 sec (likely due to OSA), occasional short atrial tachycardia runs, no atrial fibrillation, avg HR 70s, 3% PVCs.  - Event monitor (6/18): Rare 3-4 second pauses at night, rare atrial fibrillation, rare runs of junctional bradycardia.   Current Outpatient Medications  Medication Sig Dispense Refill  . aspirin EC 81 MG tablet Take 1 tablet (81 mg total) by mouth daily. 30 tablet 3  . eplerenone (INSPRA) 25 MG tablet TAKE 1 TABLET BY MOUTH ONCE DAILY 30 tablet 11  . magnesium gluconate (MAGONATE) 500 MG tablet Take 500 mg by mouth 2 (two) times daily.     . Omega-3 Fatty Acids (FISH OIL PO) Take 360 mg by mouth daily.     . potassium chloride (K-DUR,KLOR-CON) 20 MEQ tablet Take 2.5 tablets (50 mEq total) by mouth daily. 225 tablet 3  . torsemide (DEMADEX) 20 MG tablet Take 3 tablets (60 mg total) by mouth daily. Alternate 64m (3 tabs) and 813m(4 tabs) every other day 270 tablet 6  . valsartan (DIOVAN) 40 MG tablet Take 1 tablet (40 mg total) by mouth daily. Pt taking 4057mn the morning and 48m70m the evening 270 tablet 3   No current facility-administered medications for this encounter.     Allergies:   Carvedilol; Codeine; Opsumit  [macitentan]; Dabigatran; Pradaxa [dabigatran etexilate mesylate]; Sulfonamide derivatives; and Xarelto [rivaroxaban]   Social History:  The patient  reports that he has never smoked. He has never used smokeless tobacco. He reports that he does not drink alcohol or use drugs.   Family History:  The patient's family history includes Diabetes in an other family member. He was adopted.   ROS:  Please see the history of present illness.   All other systems are personally reviewed and negative.   Exam:  (Video/Tele Health Call; Exam is subjective and or/visual.) BP 110/65, HR 70 General:  Speaks in full sentences. No resp difficulty. Lungs: Normal respiratory effort with conversation.  Abdomen: Non-distended per patient report Extremities: Pt denies edema. Neuro: Alert & oriented x 3.   Recent Labs: 02/24/2018: ALT 34; Pro B Natriuretic peptide (BNP) 644.0 04/26/2018: B Natriuretic Peptide 371.2; Hemoglobin 13.0; Platelets 184 06/16/2018: BUN 23; Creatinine, Ser 1.12; Potassium 3.6; Sodium 135  Personally reviewed   Wt Readings from Last 3 Encounters:  06/06/18 70 kg (154 lb 6.4 oz)  06/02/18 68.1 kg (150 lb 2.1 oz)  05/30/18 71.5 kg (157 lb 9.6 oz)      ASSESSMENT AND PLAN:  1. CAD: S/p LIMA-LAD.  He has decided not to take statins after reviewing the data (I did recommend taking a statin but we have agreed to disagree on this, he is taking red yeast rice extract).  Lexiscan Cardiolite was done in 9/17 due to atypical chest pain.  This showed prior infarction with no ischemia.  No recurrence of chest pain since that time despite ongoing exercise.   - Continue ASA 81 daily.  2. S/p mitral valve repair: The MV repair looked stable on 7/19 echo with no evidence for significant mitral stenosis and mild MR.  3. Aortic insufficiency: Moderate on 9/18 echo.  Echo in 7/19 with mild-moderate AI.  4. Chronic systolic CHF:  Ischemic cardiomyopathy, EF 25% on 7/19 echo, stable.  PA pressure remained  elevated by doppler measurement with mildly decreased RV systolic function.  Most recent CPX in 8/19 with moderate HF limitation, mildly worse than prior.  He was volume overloaded on 8/19 RHC with severe pulmonary hypertension but preserved cardiac output.  Today, he is doing better.  Stable weight, NYHA class II symptoms.  - Continue torsemide at 60 mg daily and KCl 50 mEq daily.  I will arrange for BMET.  - Bisoprolol and digoxin were both stopped with bradycardia and lightheadedness.   - He did not tolerate Entresto.  Increase valsartan back to 40 qam/80 qpm.  We discussed how he is on valsartan for his CHF and not for HTN.    - Continue eplerenone 25 (unable to tolerate increase).        - He does not qualify for CRT.   5. Atrial fibrillation: s/p Maze in 10/14, then ablation in 3/15.  Prior to Maze, he was on Tikosyn but had breakthrough.  Currently, he is not anticoagulated due to history of prostate bleeding and his choice.  He did have his LA appendage oversewn at time of MV surgery.   1% atrial fibrillation on 4/19 event monitor. Occasional palpitations.  - Off nodal blockers with bradycardia.    - We have had discussions about what to do with his atrial fibrillation.  He is symptomatic at times when in atrial fibrillation/fluttter. He had breakthrough on Tikosyn in the past, but has had Maze and atrial fibrillation ablation since that time. He opted to hold off on trying Tikosyn again and currently feels like the atrial fibrillation burden is manageable.  - I will get an ECG when he comes for BMET.  6. Pulmonary hypertension: Severe by echo in 9/17 and on RHC in 10/17.  RV actually looked ok on 9/17 echo.  Mixed pulmonary venous and pulmonary arterial HTN on RHC.  It is possible that the [facility] component is due to pulmonary vascular remodeling in the setting of chronic mitral regurgitation prior to MV repair. Negative V/Q scan, no evidence for CTEPH.  PFTs normal in 9/14.  He is seeing Dr [place] Bells.   Sleep study showed moderate OSA but he has been unable to tolerate CPAP and is now using an oral device. PASP 76 mmHg by echo in 7/19.  He tried Engineer, site but stopped it so that he could take a nitric oxide supplement.  I repeated RHC in 8/19.  This showed severe mixed pulmonary venous/pulmonary arterial HTN with PVR 5.8 WU and preserved cardiac output. He was started on Opsumit but did not tolerate due to extensive peripheral edema.  - He wants to continue to take nitric oxide-active herbal and wants to avoid Adcirca => interestingly, his symptoms seemd to improve with this - Breathing improved with Opsumit, but he was unable to tolerate due to severe peripheral edema.     - We discussed a trial of selexipag.  He has decided that he does not want to do this.  7. OSA: Moderate.  Has not tolerated CPAP. Probably plays a role in recurrent atrial fibrillation and pulmonary hypertension as well as nocturnal sinus pauses.  He has been using his oral device. 8. Carotid stenosis: 2/18 carotid dopplers at [facility] with < 50% bilateral stenosis.   9. Bradycardia: Nocturnal bradycardia likely related to OSA.  Now off digoxin and bisoprolol.   COVID screen The patient does not have any symptoms that suggest any further testing/ screening  at this time.  Social distancing reinforced today.  Patient Risk: After full review of this patients clinical status, I feel that they are at moderate risk for cardiac decompensation at this time.  Relevant cardiac medications were reviewed at length with the patient today. The patient does not have concerns regarding their medications at this time.   Recommended follow-up:  3 months  Today, I have spent 18 minutes with the patient with telehealth technology discussing the above issues .    Signed, Loralie Champagne, MD  08/11/2018  [place] [address] [facility] [place] [address] [phone] (office)  [phone] (fax)

## 2018-08-11 NOTE — Patient Instructions (Addendum)
MAKE SURE to take Valsartan 39m (1 tab) every morning AND 869m(2 tabs) every evening.  Please follow up with lab work and an EKG in office next week. This is scheduled for May 27th at 11 am. The parking code for May is 8006.  Please follow up with Dr. McAundra Dubinn 3 months. This is scheduled for August 27th at 2:20pm. The parking code for August is [id number].

## 2018-08-11 NOTE — Progress Notes (Signed)
Spoke to pt to review after visit summary. Pt agreeable.

## 2018-08-12 ENCOUNTER — Telehealth (HOSPITAL_COMMUNITY): Payer: Self-pay | Admitting: *Deleted

## 2018-08-12 MED ORDER — VALSARTAN 40 MG PO TABS: ORAL_TABLET | ORAL | 3 refills | Status: DC

## 2018-08-12 NOTE — Telephone Encounter (Signed)
[facility] outpt pharmacy called to report Valsartan is on long term back order and the only arb they have is losartan.  Called pt he requests we send valsartan to [facility]. Script sent [facility].

## 2018-08-16 ENCOUNTER — Telehealth (HOSPITAL_COMMUNITY): Payer: Self-pay

## 2018-08-16 NOTE — Telephone Encounter (Signed)
Pt returned call about virtual cardiac rehab. Pt expressed he did not have time and was not good with smart devices. Pt expressed he would like handouts mailed to him and finish sessions once cardiac rehab opens. Handouts will be sent to Pt by mail.

## 2018-08-16 NOTE — Telephone Encounter (Signed)
Phone call made to Pt to provide information about virtual cardiac rehab. Pt spouse answered. Pt was on another call. Pt will call back once finished with other call.

## 2018-08-17 ENCOUNTER — Other Ambulatory Visit: Payer: Self-pay

## 2018-08-17 ENCOUNTER — Ambulatory Visit (HOSPITAL_COMMUNITY)
Admission: RE | Admit: 2018-08-17 | Discharge: 2018-08-17 | Disposition: A | Discharge status: Home / Self Care | Payer: Medicare Other | Source: Ambulatory Visit | Attending: Internal Medicine | Admitting: Internal Medicine

## 2018-08-17 ENCOUNTER — Ambulatory Visit (HOSPITAL_COMMUNITY): Payer: Medicare Other

## 2018-08-17 DIAGNOSIS — I5022 Chronic systolic (congestive) heart failure: Secondary | ICD-10-CM | POA: Insufficient documentation

## 2018-08-17 DIAGNOSIS — I4510 Unspecified right bundle-branch block: Secondary | ICD-10-CM | POA: Insufficient documentation

## 2018-08-17 DIAGNOSIS — I4891 Unspecified atrial fibrillation: Secondary | ICD-10-CM | POA: Diagnosis not present

## 2018-08-17 LAB — BASIC METABOLIC PANEL
Anion gap: 12 (ref 5–15)
BUN: 19 mg/dL (ref 8–23)
CO2: 26 mmol/L (ref 22–32)
Calcium: 9.6 mg/dL (ref 8.9–10.3)
Chloride: 97 mmol/L — ABNORMAL LOW (ref 98–111)
Creatinine, Ser: 0.96 mg/dL (ref 0.61–1.24)
GFR calc Af Amer: >60 mL/min (ref >60)
GFR calc non Af Amer: >60 mL/min (ref >60)
Glucose, Bld: 94 mg/dL (ref 70–99)
Potassium: 4.1 mmol/L (ref 3.5–5.1)
Sodium: 135 mmol/L (ref 135–145)

## 2018-08-17 LAB — CBC
HCT: 44.4 % (ref 39.0–52.0)
Hemoglobin: 14.4 g/dL (ref 13.0–17.0)
MCH: 30.4 pg (ref 26.0–34.0)
MCHC: 32.4 g/dL (ref 30.0–36.0)
MCV: 93.7 fL (ref 80.0–100.0)
Platelets: 204 10*3/uL (ref 150–400)
RBC: 4.74 MIL/uL (ref 4.22–5.81)
RDW: 15.8 % — ABNORMAL HIGH (ref 11.5–15.5)
WBC: 5.7 10*3/uL (ref 4.0–10.5)
nRBC: 0 % (ref 0.0–0.2)

## 2018-08-17 NOTE — Progress Notes (Signed)
PATIENT PRESENTS TO OFFICE FOR EKG AND LABS AS ORDERED PER DR [facility]

## 2018-08-19 ENCOUNTER — Telehealth (HOSPITAL_COMMUNITY): Payer: Self-pay | Admitting: Cardiology

## 2018-08-19 MED ORDER — BISOPROLOL FUMARATE 5 MG PO TABS: 2.5000 mg | ORAL_TABLET | Freq: Every day | ORAL | 3 refills | Status: DC

## 2018-08-19 MED FILL — BISOPROLOL FUMARATE 5 MG TA: 5 | 90 days supply | Qty: 45 | Fill #0

## 2018-08-19 MED FILL — TORSEMIDE 20 MG TABLET: 20 | 77 days supply | Qty: 270 | Fill #1

## 2018-08-19 NOTE — Telephone Encounter (Signed)
Notes recorded by Kerry Dory, CMA on 08/19/2018 at 2:15 PM EDT Pt aware ------  Notes recorded by Larey Dresser, MD on 08/19/2018 at 12:29 PM EDT Labs OK, please have him start bisoprolol 2.5 mg daily.

## 2018-08-22 ENCOUNTER — Ambulatory Visit (HOSPITAL_COMMUNITY): Payer: Medicare Other

## 2018-08-24 ENCOUNTER — Ambulatory Visit (HOSPITAL_COMMUNITY): Payer: Medicare Other

## 2018-08-25 ENCOUNTER — Ambulatory Visit (HOSPITAL_COMMUNITY)
Admission: RE | Admit: 2018-08-25 | Discharge: 2018-08-25 | Disposition: A | Discharge status: Home / Self Care | Payer: Medicare Other | Source: Ambulatory Visit | Attending: Cardiology | Admitting: Cardiology

## 2018-08-25 ENCOUNTER — Other Ambulatory Visit: Payer: Self-pay

## 2018-08-25 ENCOUNTER — Encounter (HOSPITAL_COMMUNITY): Payer: Self-pay | Admitting: Cardiology

## 2018-08-25 VITALS — BP 126/76 | HR 76 | Wt 150.0 lb

## 2018-08-25 DIAGNOSIS — I480 Paroxysmal atrial fibrillation: Secondary | ICD-10-CM | POA: Diagnosis not present

## 2018-08-25 DIAGNOSIS — I5022 Chronic systolic (congestive) heart failure: Secondary | ICD-10-CM

## 2018-08-25 DIAGNOSIS — Z79899 Other long term (current) drug therapy: Secondary | ICD-10-CM | POA: Insufficient documentation

## 2018-08-25 DIAGNOSIS — Z8249 Family history of ischemic heart disease and other diseases of the circulatory system: Secondary | ICD-10-CM | POA: Diagnosis not present

## 2018-08-25 DIAGNOSIS — I255 Ischemic cardiomyopathy: Secondary | ICD-10-CM | POA: Insufficient documentation

## 2018-08-25 DIAGNOSIS — I440 Atrioventricular block, first degree: Secondary | ICD-10-CM | POA: Insufficient documentation

## 2018-08-25 DIAGNOSIS — R0789 Other chest pain: Secondary | ICD-10-CM | POA: Insufficient documentation

## 2018-08-25 DIAGNOSIS — E785 Hyperlipidemia, unspecified: Secondary | ICD-10-CM | POA: Insufficient documentation

## 2018-08-25 DIAGNOSIS — Z951 Presence of aortocoronary bypass graft: Secondary | ICD-10-CM | POA: Diagnosis not present

## 2018-08-25 DIAGNOSIS — I110 Hypertensive heart disease with heart failure: Secondary | ICD-10-CM | POA: Insufficient documentation

## 2018-08-25 DIAGNOSIS — I080 Rheumatic disorders of both mitral and aortic valves: Secondary | ICD-10-CM | POA: Insufficient documentation

## 2018-08-25 DIAGNOSIS — G629 Polyneuropathy, unspecified: Secondary | ICD-10-CM | POA: Diagnosis not present

## 2018-08-25 DIAGNOSIS — G4733 Obstructive sleep apnea (adult) (pediatric): Secondary | ICD-10-CM | POA: Insufficient documentation

## 2018-08-25 DIAGNOSIS — I2721 Secondary pulmonary arterial hypertension: Secondary | ICD-10-CM | POA: Insufficient documentation

## 2018-08-25 DIAGNOSIS — I272 Pulmonary hypertension, unspecified: Secondary | ICD-10-CM | POA: Diagnosis not present

## 2018-08-25 DIAGNOSIS — I6523 Occlusion and stenosis of bilateral carotid arteries: Secondary | ICD-10-CM | POA: Insufficient documentation

## 2018-08-25 DIAGNOSIS — Z7982 Long term (current) use of aspirin: Secondary | ICD-10-CM | POA: Insufficient documentation

## 2018-08-25 DIAGNOSIS — I2510 Atherosclerotic heart disease of native coronary artery without angina pectoris: Secondary | ICD-10-CM | POA: Diagnosis not present

## 2018-08-25 DIAGNOSIS — I872 Venous insufficiency (chronic) (peripheral): Secondary | ICD-10-CM | POA: Diagnosis not present

## 2018-08-25 DIAGNOSIS — I498 Other specified cardiac arrhythmias: Secondary | ICD-10-CM

## 2018-08-25 DIAGNOSIS — Z8673 Personal history of transient ischemic attack (TIA), and cerebral infarction without residual deficits: Secondary | ICD-10-CM | POA: Diagnosis not present

## 2018-08-25 DIAGNOSIS — I252 Old myocardial infarction: Secondary | ICD-10-CM | POA: Diagnosis not present

## 2018-08-25 NOTE — Addendum Note (Signed)
Encounter addended by: Harvie Junior, CMA on: 08/25/2018 3:54 PM  Actions taken: Order list changed, Diagnosis association updated

## 2018-08-25 NOTE — Addendum Note (Signed)
Encounter addended by: Harvie Junior, CMA on: 08/25/2018 2:04 PM  Actions taken: Order list changed, Diagnosis association updated

## 2018-08-25 NOTE — Progress Notes (Signed)
Patient ID: Caleb Moment, PhD, male   DOB: May 17, 1940, 78 y.o.   MRN: [id number]    Advanced Heart Failure Clinic Note   PCP: [name] EP: [name] Cardiology: [name]  78 y.o.with complex past history presents for heart failure followup.  Patient had anterior MI in 2004 and developed ischemic cardiomyopathy as well as mitral regurgitation. He also developed atrial fibrillation.  In 10/14, he had MV repair, Maze, CABG with LIMA-LAD, and LA appendage closure at [facility] in [place].  Subsequently, atrial fibrillation returned and he had an atrial fibrillation ablation in [facility] in 3/15.  He wore a Zio patch in 12/15 and had a low atrial fibrillation burden of 7%.  He has had a long-standing ischemic cardiomyopathy.  In 2015, EF was 20-25%.  Echo in 2016 also showed EF 25-30% but estimated PA pressure suggested severe pulmonary hypertension.     At initial appointment, he reported increased exertional dyspnea over a number of weeks.  RHC was done, showing mildly elevated PCWP with moderate pulmonary HTN and low cardiac index (1.93 thermo, 2.13 Fick).  V/Q scan showed no PE.  I started him on digoxin and have titrated his Lasix to 80 mg daily.  He is now on bisoprolol and able to tolerate it.  At last appointment, I had him try replacing valsartan with Entresto 24/26 bid.  He was unable to tolerate it (made him dizzy, though BP was not low).  Therefore, I had him restart valsartan.  CPX in 4/16 showed mildly decreased functional capacity.  He has tolerated Adcirca 20 mg daily.  He did not tolerate an attempt to uptitrate bisoprolol.  He tried CPAP but was unable to tolerate it.  He was unable to tolerate eplerenone 50 mg daily.  Last echo in 9/17 showed EF 20-25%, stable MV repair, normal RV, and PA systolic pressure 86 mmHg.   He had a Lexiscan Cardiolite in 9/17 that showed infarction, no ischemia.  [facility] in 10/17 showed severe mixed pulmonary venous HTN/pulmonary arterial HTN with PVR 4.3  WU and PCWP mildly elevated.  Adcirca was increased to 40 mg daily.   At a prior appointment, he was having more palpitations.  Event monitor in 11/17 showed atrial fibrillation episodes, these were symptomatic.  Since then, the atrial fibrillation seems to have decreased.  He saw [name] to discuss Tikosyn initiation.  No decision was reached at that time, and since palpitations have decreased again, he wants to hold off on Tikosyn for now.   He has OSA.  He cannot tolerate CPAP but is using his oral appliance.    HR was lower in 5/18.  He came into the office with HR around 40.  ECG showed an ectopic atrial rhythm with very small P waves. I stopped bisoprolol and later digoxin.  HR has gone up since.  He wore an event monitor in 6/18 showing rare 3-5 second pauses at night only, rare atrial fibrillation, and rare junctional bradycardia.  He wore an event monitor again in 4/19, this showed rare afib (1% total) with nocturnal bradycardia and 1 nocturnal 3 second pause.  No concerning findings.   He stopped Adcirca as his insurance was no longer covering it.    He saw [name], and it was decided that he would be unlikely to improve much with CRT with his current IVCD (not true LBBB).   Echo (7/19) is comparable to the past with mildly dilated LV, EF 25% with wall motion abnormalities,  mild RV dilation/mild decreased function, mild to moderate AI, stable repaired mitral valve, PASP 76 mmHg.     Given worsening symptoms, he had RHC in 8/19.  This showed evidence for volume overload with severe pulmonary arterial hypertension, PVR 5.8 WU and preserved cardiac output. Torsemide was increased.    He was started on Opsumit.  He says that it helped his breathing but he was unable to continue it due to considerable worsening of his peripheral edema.  He has stopped Opsumit.    Recently, he went into atrial fibrillation with HR in the 100s at times.  I started him back on bisoprolol as he felt  palpitations. Last night, he had presyncope and HR was in the 30s with SBP in the 80s.  He took bisoprolol in the evening.  He did not pass out but was very lightheaded.  Today, he is in NSR with long 1st degree AVB, rate now in the 70s with BP 126/76.  He feels much better.  Weight is down 4 lbs.  He does get short of breath in the afternoon, notes dyspnea after walking 15-20 minutes.  No orthopnea/PND.  No chest pain.  Not short of breath in the mornings or in the evening.   ECG (personally reviewed): NSR, 1st degree AVB 304 msec, IVCD 134 msec, anterolateral Qs  REDS clip 27%  6 minute walk (3/16): 381 m  6 minute walk (5/16): 414.5 m 6 minute walk (10/16): 562 m 6 minute walk (1/17): 469 m 6 minute walk (8/17): 488 m 6 minute walk (6/18): 549 m 6 minute walk (11/18): 366 m 6 minute walk (9/19): 518 m  Labs (2/15): LDL 144 Labs (8/15): K 4.6, creatinine 0.9 Labs (12/15): HCT 42.3   Labs (2/16): K 4 => 4.2, creatinine 1.05 => 0.92, BNP 268 Labs (3/16): BNP 495 => 320, digoxin 0.4, RF 14.7 (very mild increase), TSH normal, HIV negative, anti-SCL70 negative, creatinine 0.91, K 4.4 Labs (7/16): K 5, creatinine 1.05, BNP 455, vitamin D normal, digoxin 0.4, B12 normal Labs (8/16): digoxin 0.8, BNP 305, K 4.2, creatinine 0.99 Labs (9/16): HCT 43.3, TSH normal, BNP 492 => 278, K 4.3, creatinine 0.97 => 1.04, digoxin 0.6, TSH normal, LDL 154, LDL-P 1654.  Labs (10/16): K 4.7, creatinine 1.1 Labs (1/17): pro-BNP 2065, digoxin 0.3, K 4.3, creatinine 1.10 => 1.28 Labs (3/17): K 4.1, creatinine 1.09, HCT 38.3 Labs (4/17): K 4.4, creatinine 0.99, digoxin 0.8 Labs (5/17): K 4.3, creatinine 1.08, BNP 317, hgb 13.1 Labs (8/17): K 4.6, creatinine 0.99, BNP 392, digoxin 0.5 Labs (9/17): K 4.4, creatinine 1.05 Labs (10/17): digoxin 0.5, K 4.1, creatinine 1.12, HCT 44.9, digoxin 0.5 Labs (11/17): K 4 => 3.8, creatinine 1.3 => 1.16, digoxin 0.5, BNP 296 Labs (12/17): K 4.1, creatinine 1.15, BNP 294,  digoxin 0.7 Labs (2/18): LDL 135, Lp(a) 124, LDL-P 979  Labs (5/18): K 4.3, creatinine 1.07 => 0.95, BNP 224, hgb 14.4, digoxin 0.6 Labs (6/18): K 4.4, creatinine 1.08, hgb 14 Labs (8/18): K 4.1, creatinine 1.05 Labs (12/18): K 3.7, creatinine 0.94 Labs (2/19): K 4.1, creatinine 0.99, BNP 255 Labs (5/19): K 4, creatinine 0.98, LDL 140 Labs (7/19): LDL 136 Labs (9/19): K 4.1, creatinine 0.91 Labs (12/19): K 3.6, creatinine 1.18, BNP 479, hgb 12.9 Labs (3/20): K 3.4, creatinine 1.14 Labs (5/20): K 4.1, creatinine 0.96  PMH: 1. CAD: Anterior MI in 2004.  Cardiac surgery in 10/14 included LIMA-LAD.  - Lexiscan Cardiolite (9/17): EF 35%, infarct present with no ischemia.  2. Chronic mitral regurgitation: 10/14 surgery at [facility] with MV repair, Maze, LIMA-LAD, and LA appendage closure.  3. Atrial fibrillation: Paroxysmal.  He was initially on Tikosyn but had breakthrough atrial fibrillation.  H/o Maze in 2014.  Had recurrent atrial fibrillation with ablation in 3/15 by Dr Ola Spurr in [facility].  Not anticoagulated after 2 severe prostate bleeding episodes.  LA appendage was oversewn with MV surgery.  - Zio patch in 12/15 with low atrial fibrillation burden (7%).  - Holter (9/16) with PACs, PVCs, short atrial fibrillation runs (nothing sustained). - Event monitor (11/17) with runs of atrial fibrillation and flutter.   - Event monitor (6/18) with rare atrial fibrillation - Event monitor 4/19 showing rare afib (1% total) with nocturnal bradycardia and 1 nocturnal 3 second pause.  No concerning findings.  4. Chronotropic incompetence.  5. HTN 6. Hyperlipidemia: Refuses statin.  7. Peripheral neuropathy 8. GERD 9. H/o BPPV 10. H/o TIA 11. Ischemic cardiomyopathy: cardiac MRI 7/14 with EF 35%, moderate MR, normal RV size and systolic function, extensive anterior and anteroseptal LGE suggestive of non-viable myocardium (this was prior to LIMA-LAD).  Echo 1/15 with EF 20-25%, moderate AI,  PA systolic pressure 39 mmHg. Echo (1/16) with EF 20-25%, diffuse hypokinesis with regionality, moderate LV dilation, moderate AI, s/p MV repair with mild MR and normal gradients, RV dilated with mildly decreased systolic function, PA systolic pressure 71 mmHg.   - RHC (2/16) with mean RA 9, PA 61/25 mean 40, mean PCWP 23, CI 2.13/PVR 4.3 (Fick), CI 1.93/PVR 4.7 (thermo).   - CPX (4/16) with peak VO2 17.9, VE/VCO2 34.7 => mildly decreased functional capacity.  - Spironolactone apparently caused cognitive deficits - Intolerant of Coreg due to development of severe alopecia - Unable to uptitrate bisoprolol due to intolerance.  - Lightheaded with Entresto.  - Unable to tolerate increase in valsartan to 80 mg bid.  - Echo (1/17) with EF 30-35%, moderate LV dilation, mild AI, s/p MV repair with mild MR, moderately dilated RV with mildly decreased systolic function, PA systolic pressure 69 mmHg.  - CPX (4/17): peak VO2 18, VE/VCO2 slope 33, RER 1.28 => mild to moderate functional impairment, mildly improved.  - Echo (9/17): EF 20-25% with regional WMAs, normal RV size and systolic function, PASP 86 mmHg, stable repaired mitral valve with mild MR, moderate AI.  - RHC (10/17): mean RA 9, PA 70/23 mean 43, PCWP mean 20, CI 2.96 Fick/2.84 Thermo, PVR 4.3 WU.  - Echo (9/18): severe LV dilation, EF 20-25% with WMAs, s/p MV repair with mild MR, moderate AI, moderate TR, PASP 68 mmHg, normal RV size and systolic function.  - Echo (7/19): mildly dilated LV, EF 25% with wall motion abnormalities, mild RV dilation/mild decreased function, mild to moderate AI, stable repaired mitral valve (no MS, mild MR), PASP 76 mmHg.   - CPX (8/19): peak VO2 18.9, VE/VCO2 slope 40, RER 1.09 => moderate HF limitation.  - RHC (8/19): mean RA 14, PA 75/28 mean 47, mean PCWP 24, CI 2.44/PVR 5 Fick, CI 2.13/PVR 5.8 thermo.  12. Carotid stenosis: Carotid dopplers (1/16) with 40-59% bilateral ICA stenosis. Carotid dopplers (4/17) with  40-59% BICA stenosis.  - Carotid dopplers (2/18) with < 50% BICA stenosis.  13. Aortic insufficiency: Moderate by last echo 9/17.  14. Pulmonary HTN: Mixed PAH and pulmonary venous hypertension.  PFTs (9/14) were normal.  V/Q scan (2/16) with no evidence of acute or chronic PE.   - Unable to tolerate Opsumit due to extensive  peripheral edema.  15. OSA: Moderate on 5/16 sleep study. Unable to tolerate CPAP.  Repeat sleep study at [facility] in 2/18 was also suggestive of OSA.  16. Venous insufficiency 17. Ventral hernia 18. Bradycardia: Holter (5/18) with overnight pauses up to 4.7 sec (likely due to OSA), occasional short atrial tachycardia runs, no atrial fibrillation, avg HR 70s, 3% PVCs.  - Event monitor (6/18): Rare 3-4 second pauses at night, rare atrial fibrillation, rare runs of junctional bradycardia.   SH: Married, lives in [place], Engineer, water, nonsmoker  FH: CAD  ROS: All systems reviewed and negative except as per HPI.   Current Outpatient Medications  Medication Sig Dispense Refill   aspirin EC 81 MG tablet Take 1 tablet (81 mg total) by mouth daily. 30 tablet 3   eplerenone (INSPRA) 25 MG tablet TAKE 1 TABLET BY MOUTH ONCE DAILY 30 tablet 11   magnesium gluconate (MAGONATE) 500 MG tablet Take 500 mg by mouth 2 (two) times daily.      Omega-3 Fatty Acids (FISH OIL PO) Take 360 mg by mouth daily.      potassium chloride (K-DUR,KLOR-CON) 20 MEQ tablet Take 2.5 tablets (50 mEq total) by mouth daily. 225 tablet 3   torsemide (DEMADEX) 20 MG tablet Take 3 tablets (60 mg total) by mouth daily. Alternate 17m (3 tabs) and 890m(4 tabs) every other day 270 tablet 6   valsartan (DIOVAN) 40 MG tablet Take 4073mn the morning and 50m65m the evening 270 tablet 3   bisoprolol (ZEBETA) 5 MG tablet Take 0.5 tablets (2.5 mg total) by mouth daily. (Patient not taking: Reported on 08/25/2018) 45 tablet 3   No current facility-administered medications for this encounter.    BP  126/76    Pulse 76    Wt 68 kg (150 lb)    SpO2 98%    BMI 22.15 kg/m    Wt Readings from Last 3 Encounters:  08/25/18 68 kg (150 lb)  08/17/18 66.8 kg (147 lb 3.2 oz)  06/06/18 70 kg (154 lb 6.4 oz)    General: NAD Neck: No JVD, no thyromegaly or thyroid nodule.  Lungs: Clear to auscultation bilaterally with normal respiratory effort. CV: Nondisplaced PMI.  Heart regular S1/S2, no S3/S4, 2/6 SEM RUSB.  1+ ankle edema.  No carotid bruit.  Normal pedal pulses.  Abdomen: Soft, nontender, no hepatosplenomegaly, no distention.  Skin: Intact without lesions or rashes.  Neurologic: Alert and oriented x 3.  Psych: Normal affect. Extremities: No clubbing or cyanosis.  HEENT: Normal.    Assessment/Plan: 1. CAD: S/p LIMA-LAD.  He has decided not to take statins after reviewing the data (I did recommend taking a statin but we have agreed to disagree on this, he is taking red yeast rice extract).  Lexiscan Cardiolite was done in 9/17 due to atypical chest pain.  This showed prior infarction with no ischemia.  No recurrence of chest pain since that time despite ongoing exercise.   - Continue ASA 81 daily.  2. S/p mitral valve repair: The MV repair looked stable on 7/19 echo with no evidence for significant mitral stenosis and mild MR.  3. Aortic insufficiency: Moderate on 9/18 echo.  Echo in 7/19 with mild-moderate AI.  4. Chronic systolic CHF: Ischemic cardiomyopathy, EF 25% on 7/19 echo, stable.  PA pressure remained elevated by doppler measurement with mildly decreased RV systolic function.  Most recent CPX in 8/19 with moderate HF limitation, mildly worse than prior.  He was volume overloaded on 8/19  RHC with severe pulmonary hypertension but preserved cardiac output.  NYHA class II-III symptoms. He does not look volume overloaded by exam today.  REDS clip 27%.  - Increase torsemide to 60 daily alternating with 80 daily. Increase KCl to 50 mEq daily. BMET 10 days.  - Bisoprolol and digoxin were  both stopped with bradycardia and lightheadedness.   - He did not tolerate Entresto.  Continue valsartan 40 qam/80 qpm, he has not tolerated uptitration.   - Continue eplerenone 25 (unable to tolerate increase).        - He does not qualify for CRT.   - Restart cardiac rehab.  5. Atrial fibrillation: s/p Maze in 10/14, then ablation in 3/15.  Prior to Maze, he was on Tikosyn but had breakthrough.  Currently, he is not anticoagulated due to history of prostate bleeding and his choice.  He did have his LA appendage oversewn at time of MV surgery.   1% atrial fibrillation on 4/19 event monitor. He is in NSR with 1st degree AVB today. - Stop bisoprolol with bradycardia.    - We have had discussions about what to do with his atrial fibrillation.  He is symptomatic at times when in atrial fibrillation/fluttter. He had breakthrough on Tikosyn in the past, but has had Maze and atrial fibrillation ablation since that time. He opted to hold off on trying Tikosyn again and currently feels like the atrial fibrillation burden is manageable.  6. Pulmonary hypertension: Severe by echo in 9/17 and on RHC in 10/17.  RV actually looked ok on 9/17 echo.  Mixed pulmonary venous and pulmonary arterial HTN on RHC.  It is possible that the [facility] component is due to pulmonary vascular remodeling in the setting of chronic mitral regurgitation prior to MV repair. Negative V/Q scan, no evidence for CTEPH.  PFTs normal in 9/14.  He is seeing Dr Lake Bells.  Sleep study showed moderate OSA but he has been unable to tolerate CPAP and is now using an oral device. PASP 76 mmHg by echo in 7/19.  He tried Engineer, site but stopped it so that he could take a nitric oxide supplement.  I repeated RHC in 8/19.  This showed severe mixed pulmonary venous/pulmonary arterial HTN with PVR 5.8 WU and preserved cardiac output. He was started on Opsumit but did not tolerate due to extensive peripheral edema.  - He wants to continue to take nitric oxide-active  herbal and wants to avoid Adcirca => interestingly, his symptoms seemed to improve with this - Breathing improved with Opsumit, but he was unable to tolerate due to severe peripheral edema.     - We discussed a trial of selexipag.  He is now interested in trying this.  Will start paperwork and see if we can get him started on selexipag.  7. OSA: Moderate.  Has not tolerated CPAP. Probably plays a role in recurrent atrial fibrillation and pulmonary hypertension as well as nocturnal sinus pauses.  He has been using his oral device. 8. Carotid stenosis: 2/18 carotid dopplers at [facility] with < 50% bilateral stenosis.   9. Bradycardia: Nocturnal bradycardia in the past likely related to OSA.  Developed more ongoing bradycardia and bisoprolol and digoxin stopped.  Restarted recently on bisoprolol with afib/mild RVR but became bradycardic with pre-syncope. I think that he has sick sinus syndrome.  ECG shows small, diseased-appearing P waves with long PR, also IVCD.  I suspect he will eventually need a PPM, but currently, his HR is adequate off bisoprolol. - Stay  off all nodal blockers.  - I will have him wear a 3 day Zio patch.    - If he has any further clinically significant bradycardia, he will need PPM.   Followup in 1 month.    Caleb [name] 08/25/2018

## 2018-08-25 NOTE — Progress Notes (Signed)
ReDS Vest - 08/25/18 1347      ReDS Vest   Estimated volume prior to reading  Low    Fitting Posture  Sitting    Height Marker  Tall    Ruler Value  [place] C

## 2018-08-25 NOTE — Addendum Note (Signed)
Encounter addended by: Jovita Kussmaul, RN on: 08/25/2018 1:56 PM  Actions taken: Order list changed, Medication long-term status modified, Clinical Note Signed

## 2018-08-25 NOTE — Patient Instructions (Addendum)
STOP taking Bisoprolol.  We will start the process for Selexipag Prescription and Patient Enrollment Form.  Your provider has recommended that  you wear a Zio Patch for 3  days.  This monitor will record your heart rhythm for our review.  IF you have any symptoms while wearing the monitor please press the button.  If you have any issues with the patch or you notice a red or orange light on it please call the company at [phone].  Once you remove the patch please mail it back to the company as soon as possible so we can get the results.  Your physician recommends that you schedule a follow-up appointment in: 1 MONTH with Dr Aundra Dubin. His scheduler will call to schedule this appointment with you.  Have a wonderful Thursday!!! :)

## 2018-08-26 ENCOUNTER — Telehealth (HOSPITAL_COMMUNITY): Payer: Self-pay | Admitting: *Deleted

## 2018-08-26 NOTE — Telephone Encounter (Signed)
Pt called to report he is feeling much worse than yesterday, he states HR and BP are running low and he frequently feels like he is about to pass out.  He states he hasn't felt this bad in a very long time.  He feels he needs to come to ER and wanted [place] to be aware, he is unsure if he is going to call 911 or have his wife bring him.  Dr Aundra Dubin is aware

## 2018-08-29 ENCOUNTER — Ambulatory Visit (HOSPITAL_COMMUNITY): Payer: Medicare Other

## 2018-08-30 MED FILL — EPLERENONE 25 MG TABLET: 25 | 30 days supply | Qty: 30 | Fill #5

## 2018-08-31 ENCOUNTER — Ambulatory Visit (HOSPITAL_COMMUNITY): Payer: Medicare Other

## 2018-09-01 DIAGNOSIS — I493 Ventricular premature depolarization: Secondary | ICD-10-CM | POA: Diagnosis not present

## 2018-09-05 ENCOUNTER — Telehealth (HOSPITAL_COMMUNITY): Payer: Self-pay

## 2018-09-05 ENCOUNTER — Ambulatory Visit (HOSPITAL_COMMUNITY): Payer: Medicare Other

## 2018-09-05 NOTE — Telephone Encounter (Signed)
Prior authorization through [employer] was APPROVED for Caleb [name] and will expire on 03/23/2019.

## 2018-09-05 NOTE — Telephone Encounter (Signed)
PA submitted via CMM for Caleb [name]

## 2018-09-07 ENCOUNTER — Ambulatory Visit (HOSPITAL_COMMUNITY): Payer: Medicare Other

## 2018-09-12 ENCOUNTER — Ambulatory Visit (HOSPITAL_COMMUNITY): Payer: Medicare Other

## 2018-09-14 ENCOUNTER — Ambulatory Visit (HOSPITAL_COMMUNITY): Payer: Medicare Other

## 2018-09-19 ENCOUNTER — Telehealth (HOSPITAL_COMMUNITY): Payer: Self-pay | Admitting: *Deleted

## 2018-09-19 ENCOUNTER — Ambulatory Visit (HOSPITAL_COMMUNITY): Payer: Medicare Other

## 2018-09-19 NOTE — Telephone Encounter (Signed)
-----  Message from Larey Dresser, MD sent at 09/19/2018 12:44 PM EDT ----- Regarding: RE: Ok to return to on site Cardiac Rehab yes ----- Message ----- From: Rowe Pavy, RN Sent: 09/19/2018  12:33 PM EDT To: Larey Dresser, MD Subject: Madaline Brilliant to return to on site Cardiac Rehab          [name]  We are preparing for current patients to phase in reentry to in facility cardiac rehab. A great deal of planning with advisement from our Medical Director - Dr. Radford Pax, CV Service line leadership, Infection Disease Control, Facilities, security,recommendations from American Association of Cardiac and Pulmonary Rehab (AACVPR) with the goal for optimal patient safety. Patients will have strict guidelines and criteria they must adhere and follow.  Pt will have to complete screenings prior to their scheduled appointment and  again prior to entry into gym area.  Your pt,   Denson Niccoli   expressed great interest in returning to in facility cardiac rehab. Pt has a Covid risk score 6 . Do you feel this pt is appropriate to resume in facility cardiac rehab?  Any additional restrictions you feel are appropriate for this pt?   Thank you and we appreciate your valued input.  Cherre Huger, BSN Cardiac and Training and development officer

## 2018-09-20 ENCOUNTER — Telehealth (HOSPITAL_COMMUNITY): Payer: Self-pay

## 2018-09-20 NOTE — Telephone Encounter (Signed)
Pt reschedule for CR, pt will come in 09/26/2018 @ 1030AM.

## 2018-09-21 ENCOUNTER — Ambulatory Visit (HOSPITAL_COMMUNITY): Payer: Medicare Other

## 2018-09-22 ENCOUNTER — Telehealth (HOSPITAL_COMMUNITY): Payer: Self-pay | Admitting: Cardiology

## 2018-09-22 NOTE — Telephone Encounter (Signed)
COVID Screening completed.  -[facility]

## 2018-09-26 ENCOUNTER — Ambulatory Visit (HOSPITAL_COMMUNITY): Payer: Medicare Other

## 2018-09-26 ENCOUNTER — Encounter (HOSPITAL_COMMUNITY)
Admission: RE | Admit: 2018-09-26 | Discharge: 2018-09-26 | Disposition: A | Discharge status: Home / Self Care | Payer: Medicare Other | Source: Ambulatory Visit | Attending: Cardiology | Admitting: Cardiology

## 2018-09-26 ENCOUNTER — Encounter (HOSPITAL_COMMUNITY): Payer: Self-pay | Admitting: Cardiology

## 2018-09-26 ENCOUNTER — Other Ambulatory Visit: Payer: Self-pay

## 2018-09-26 ENCOUNTER — Ambulatory Visit (HOSPITAL_COMMUNITY)
Admission: RE | Admit: 2018-09-26 | Discharge: 2018-09-26 | Disposition: A | Discharge status: Home / Self Care | Payer: Medicare Other | Source: Ambulatory Visit | Attending: Cardiology | Admitting: Cardiology

## 2018-09-26 VITALS — BP 124/66 | HR 75 | Wt 152.8 lb

## 2018-09-26 DIAGNOSIS — I255 Ischemic cardiomyopathy: Secondary | ICD-10-CM | POA: Diagnosis not present

## 2018-09-26 DIAGNOSIS — E785 Hyperlipidemia, unspecified: Secondary | ICD-10-CM | POA: Diagnosis not present

## 2018-09-26 DIAGNOSIS — Z79899 Other long term (current) drug therapy: Secondary | ICD-10-CM | POA: Diagnosis not present

## 2018-09-26 DIAGNOSIS — Z8249 Family history of ischemic heart disease and other diseases of the circulatory system: Secondary | ICD-10-CM | POA: Diagnosis not present

## 2018-09-26 DIAGNOSIS — I5022 Chronic systolic (congestive) heart failure: Secondary | ICD-10-CM | POA: Diagnosis not present

## 2018-09-26 DIAGNOSIS — I080 Rheumatic disorders of both mitral and aortic valves: Secondary | ICD-10-CM | POA: Diagnosis not present

## 2018-09-26 DIAGNOSIS — Z7982 Long term (current) use of aspirin: Secondary | ICD-10-CM | POA: Insufficient documentation

## 2018-09-26 DIAGNOSIS — I252 Old myocardial infarction: Secondary | ICD-10-CM | POA: Insufficient documentation

## 2018-09-26 DIAGNOSIS — I272 Pulmonary hypertension, unspecified: Secondary | ICD-10-CM | POA: Diagnosis not present

## 2018-09-26 DIAGNOSIS — G629 Polyneuropathy, unspecified: Secondary | ICD-10-CM | POA: Diagnosis not present

## 2018-09-26 DIAGNOSIS — G4733 Obstructive sleep apnea (adult) (pediatric): Secondary | ICD-10-CM | POA: Insufficient documentation

## 2018-09-26 DIAGNOSIS — Z9889 Other specified postprocedural states: Secondary | ICD-10-CM | POA: Diagnosis not present

## 2018-09-26 DIAGNOSIS — K219 Gastro-esophageal reflux disease without esophagitis: Secondary | ICD-10-CM | POA: Diagnosis not present

## 2018-09-26 DIAGNOSIS — Z951 Presence of aortocoronary bypass graft: Secondary | ICD-10-CM | POA: Diagnosis not present

## 2018-09-26 DIAGNOSIS — I480 Paroxysmal atrial fibrillation: Secondary | ICD-10-CM

## 2018-09-26 DIAGNOSIS — I872 Venous insufficiency (chronic) (peripheral): Secondary | ICD-10-CM | POA: Diagnosis not present

## 2018-09-26 DIAGNOSIS — I110 Hypertensive heart disease with heart failure: Secondary | ICD-10-CM | POA: Insufficient documentation

## 2018-09-26 DIAGNOSIS — I6523 Occlusion and stenosis of bilateral carotid arteries: Secondary | ICD-10-CM | POA: Insufficient documentation

## 2018-09-26 DIAGNOSIS — Z8673 Personal history of transient ischemic attack (TIA), and cerebral infarction without residual deficits: Secondary | ICD-10-CM | POA: Insufficient documentation

## 2018-09-26 DIAGNOSIS — R001 Bradycardia, unspecified: Secondary | ICD-10-CM | POA: Insufficient documentation

## 2018-09-26 DIAGNOSIS — I2510 Atherosclerotic heart disease of native coronary artery without angina pectoris: Secondary | ICD-10-CM | POA: Insufficient documentation

## 2018-09-26 LAB — BASIC METABOLIC PANEL
Anion gap: 10 (ref 5–15)
BUN: 22 mg/dL (ref 8–23)
CO2: 27 mmol/L (ref 22–32)
Calcium: 9.4 mg/dL (ref 8.9–10.3)
Chloride: 98 mmol/L (ref 98–111)
Creatinine, Ser: 1.05 mg/dL (ref 0.61–1.24)
GFR calc Af Amer: >60 mL/min (ref >60)
GFR calc non Af Amer: >60 mL/min (ref >60)
Glucose, Bld: 88 mg/dL (ref 70–99)
Potassium: 4 mmol/L (ref 3.5–5.1)
Sodium: 135 mmol/L (ref 135–145)

## 2018-09-26 LAB — BRAIN NATRIURETIC PEPTIDE: B Natriuretic Peptide: 406.6 pg/mL — ABNORMAL HIGH (ref 0.0–100.0)

## 2018-09-26 NOTE — Progress Notes (Signed)
Patient ID: [name], PhD, male   DOB: May 17, 1940, 78 y.o.   MRN: [id number]    Advanced Heart Failure Clinic Note   PCP: [name] EP: [name] Cardiology: [name]  78 y.o.with complex past history presents for heart failure followup.  Patient had anterior MI in 2004 and developed ischemic cardiomyopathy as well as mitral regurgitation. He also developed atrial fibrillation.  In 10/14, he had MV repair, Maze, CABG with LIMA-LAD, and LA appendage closure at [facility] in [place].  Subsequently, atrial fibrillation returned and he had an atrial fibrillation ablation in [facility] in 3/15.  He wore a Zio patch in 12/15 and had a low atrial fibrillation burden of 7%.  He has had a long-standing ischemic cardiomyopathy.  In 2015, EF was 20-25%.  Echo in 2016 also showed EF 25-30% but estimated PA pressure suggested severe pulmonary hypertension.     At initial appointment, he reported increased exertional dyspnea over a number of weeks.  RHC was done, showing mildly elevated PCWP with moderate pulmonary HTN and low cardiac index (1.93 thermo, 2.13 Fick).  V/Q scan showed no PE.  I started him on digoxin and have titrated his Lasix to 80 mg daily.  He is now on bisoprolol and able to tolerate it.  At last appointment, I had him try replacing valsartan with Entresto 24/26 bid.  He was unable to tolerate it (made him dizzy, though BP was not low).  Therefore, I had him restart valsartan.  CPX in 4/16 showed mildly decreased functional capacity.  He has tolerated Adcirca 20 mg daily.  He did not tolerate an attempt to uptitrate bisoprolol.  He tried CPAP but was unable to tolerate it.  He was unable to tolerate eplerenone 50 mg daily.  Last echo in 9/17 showed EF 20-25%, stable MV repair, normal RV, and PA systolic pressure 86 mmHg.   He had a Lexiscan Cardiolite in 9/17 that showed infarction, no ischemia.  [facility] in 10/17 showed severe mixed pulmonary venous HTN/pulmonary arterial HTN with PVR 4.3  WU and PCWP mildly elevated.  Adcirca was increased to 40 mg daily.   At a prior appointment, he was having more palpitations.  Event monitor in 11/17 showed atrial fibrillation episodes, these were symptomatic.  Since then, the atrial fibrillation seems to have decreased.  He saw [name] to discuss Tikosyn initiation.  No decision was reached at that time, and since palpitations have decreased again, he wants to hold off on Tikosyn for now.   He has OSA.  He cannot tolerate CPAP but is using his oral appliance.    HR was lower in 5/18.  He came into the office with HR around 40.  ECG showed an ectopic atrial rhythm with very small P waves. I stopped bisoprolol and later digoxin.  HR has gone up since.  He wore an event monitor in 6/18 showing rare 3-5 second pauses at night only, rare atrial fibrillation, and rare junctional bradycardia.  He wore an event monitor again in 4/19, this showed rare afib (1% total) with nocturnal bradycardia and 1 nocturnal 3 second pause.  No concerning findings.   He stopped Adcirca as his insurance was no longer covering it.    He saw [name], and it was decided that he would be unlikely to improve much with CRT with his current IVCD (not true LBBB).   Echo (7/19) is comparable to the past with mildly dilated LV, EF 25% with wall motion abnormalities,  mild RV dilation/mild decreased function, mild to moderate AI, stable repaired mitral valve, PASP 76 mmHg.     Given worsening symptoms, he had RHC in 8/19.  This showed evidence for volume overload with severe pulmonary arterial hypertension, PVR 5.8 WU and preserved cardiac output. Torsemide was increased.    He was started on Opsumit.  He says that it helped his breathing but he was unable to continue it due to considerable worsening of his peripheral edema.  He has stopped Opsumit.    After developing increased atrial fibrillation burden, I put him back on bisoprolol 2.5 mg daily.  He developed bradycardia and  presyncope, so this was stopped.  Lightheadedness resolved.  Zio patch in 6/20 off bisoprolol showed average HR 68, short runs of SVT (possible atrial fibrillation) and only nocturnal bradycardia.  Breathing has been better recently, and he decided not to start Uptravi.  He has been taking olive extract and celery seed, to which he attributes his improvement. He is only taking torsemide 60 mg daily.  Weight is stable.  He is able to take a 25 minute/day walk without much problem, mild dyspnea walking up hills.  No orthopnea/PND.  No chest pain.  He is restarting cardiac rehab.   6 minute walk (3/16): 381 m  6 minute walk (5/16): 414.5 m 6 minute walk (10/16): 562 m 6 minute walk (1/17): 469 m 6 minute walk (8/17): 488 m 6 minute walk (6/18): 549 m 6 minute walk (11/18): 366 m 6 minute walk (9/19): 518 m  Labs (2/15): LDL 144 Labs (8/15): K 4.6, creatinine 0.9 Labs (12/15): HCT 42.3   Labs (2/16): K 4 => 4.2, creatinine 1.05 => 0.92, BNP 268 Labs (3/16): BNP 495 => 320, digoxin 0.4, RF 14.7 (very mild increase), TSH normal, HIV negative, anti-SCL70 negative, creatinine 0.91, K 4.4 Labs (7/16): K 5, creatinine 1.05, BNP 455, vitamin D normal, digoxin 0.4, B12 normal Labs (8/16): digoxin 0.8, BNP 305, K 4.2, creatinine 0.99 Labs (9/16): HCT 43.3, TSH normal, BNP 492 => 278, K 4.3, creatinine 0.97 => 1.04, digoxin 0.6, TSH normal, LDL 154, LDL-P 1654.  Labs (10/16): K 4.7, creatinine 1.1 Labs (1/17): pro-BNP 2065, digoxin 0.3, K 4.3, creatinine 1.10 => 1.28 Labs (3/17): K 4.1, creatinine 1.09, HCT 38.3 Labs (4/17): K 4.4, creatinine 0.99, digoxin 0.8 Labs (5/17): K 4.3, creatinine 1.08, BNP 317, hgb 13.1 Labs (8/17): K 4.6, creatinine 0.99, BNP 392, digoxin 0.5 Labs (9/17): K 4.4, creatinine 1.05 Labs (10/17): digoxin 0.5, K 4.1, creatinine 1.12, HCT 44.9, digoxin 0.5 Labs (11/17): K 4 => 3.8, creatinine 1.3 => 1.16, digoxin 0.5, BNP 296 Labs (12/17): K 4.1, creatinine 1.15, BNP 294, digoxin  0.7 Labs (2/18): LDL 135, Lp(a) 124, LDL-P 979  Labs (5/18): K 4.3, creatinine 1.07 => 0.95, BNP 224, hgb 14.4, digoxin 0.6 Labs (6/18): K 4.4, creatinine 1.08, hgb 14 Labs (8/18): K 4.1, creatinine 1.05 Labs (12/18): K 3.7, creatinine 0.94 Labs (2/19): K 4.1, creatinine 0.99, BNP 255 Labs (5/19): K 4, creatinine 0.98, LDL 140 Labs (7/19): LDL 136 Labs (9/19): K 4.1, creatinine 0.91 Labs (12/19): K 3.6, creatinine 1.18, BNP 479, hgb 12.9 Labs (3/20): K 3.4, creatinine 1.14 Labs (5/20): K 4.1, creatinine 0.96  PMH: 1. CAD: Anterior MI in 2004.  Cardiac surgery in 10/14 included LIMA-LAD.  - Lexiscan Cardiolite (9/17): EF 35%, infarct present with no ischemia.  2. Chronic mitral regurgitation: 10/14 surgery at [facility] with MV repair, Maze, LIMA-LAD, and LA appendage closure.  3.  Atrial fibrillation: Paroxysmal.  He was initially on Tikosyn but had breakthrough atrial fibrillation.  H/o Maze in 2014.  Had recurrent atrial fibrillation with ablation in 3/15 by Dr Ola Spurr in [facility].  Not anticoagulated after 2 severe prostate bleeding episodes.  LA appendage was oversewn with MV surgery.  - Zio patch in 12/15 with low atrial fibrillation burden (7%).  - Holter (9/16) with PACs, PVCs, short atrial fibrillation runs (nothing sustained). - Event monitor (11/17) with runs of atrial fibrillation and flutter.   - Event monitor (6/18) with rare atrial fibrillation - Event monitor 4/19 showing rare afib (1% total) with nocturnal bradycardia and 1 nocturnal 3 second pause.  No concerning findings.  - Zio patch (6/20): Primarily NSR, average HR 68, short SVT runs (possible atrial fibrillation), occasional nocturnal bradycardia (nothing worrisome).  4. Chronotropic incompetence.  5. HTN 6. Hyperlipidemia: Refuses statin.  7. Peripheral neuropathy 8. GERD 9. H/o BPPV 10. H/o TIA 11. Ischemic cardiomyopathy: cardiac MRI 7/14 with EF 35%, moderate MR, normal RV size and systolic function,  extensive anterior and anteroseptal LGE suggestive of non-viable myocardium (this was prior to LIMA-LAD).  Echo 1/15 with EF 20-25%, moderate AI, PA systolic pressure 39 mmHg. Echo (1/16) with EF 20-25%, diffuse hypokinesis with regionality, moderate LV dilation, moderate AI, s/p MV repair with mild MR and normal gradients, RV dilated with mildly decreased systolic function, PA systolic pressure 71 mmHg.   - RHC (2/16) with mean RA 9, PA 61/25 mean 40, mean PCWP 23, CI 2.13/PVR 4.3 (Fick), CI 1.93/PVR 4.7 (thermo).   - CPX (4/16) with peak VO2 17.9, VE/VCO2 34.7 => mildly decreased functional capacity.  - Spironolactone apparently caused cognitive deficits - Intolerant of Coreg due to development of severe alopecia - Unable to uptitrate bisoprolol due to intolerance.  - Lightheaded with Entresto.  - Unable to tolerate increase in valsartan to 80 mg bid.  - Echo (1/17) with EF 30-35%, moderate LV dilation, mild AI, s/p MV repair with mild MR, moderately dilated RV with mildly decreased systolic function, PA systolic pressure 69 mmHg.  - CPX (4/17): peak VO2 18, VE/VCO2 slope 33, RER 1.28 => mild to moderate functional impairment, mildly improved.  - Echo (9/17): EF 20-25% with regional WMAs, normal RV size and systolic function, PASP 86 mmHg, stable repaired mitral valve with mild MR, moderate AI.  - RHC (10/17): mean RA 9, PA 70/23 mean 43, PCWP mean 20, CI 2.96 Fick/2.84 Thermo, PVR 4.3 WU.  - Echo (9/18): severe LV dilation, EF 20-25% with WMAs, s/p MV repair with mild MR, moderate AI, moderate TR, PASP 68 mmHg, normal RV size and systolic function.  - Echo (7/19): mildly dilated LV, EF 25% with wall motion abnormalities, mild RV dilation/mild decreased function, mild to moderate AI, stable repaired mitral valve (no MS, mild MR), PASP 76 mmHg.   - CPX (8/19): peak VO2 18.9, VE/VCO2 slope 40, RER 1.09 => moderate HF limitation.  - RHC (8/19): mean RA 14, PA 75/28 mean 47, mean PCWP 24, CI 2.44/PVR 5  Fick, CI 2.13/PVR 5.8 thermo.  12. Carotid stenosis: Carotid dopplers (1/16) with 40-59% bilateral ICA stenosis. Carotid dopplers (4/17) with 40-59% BICA stenosis.  - Carotid dopplers (2/18) with < 50% BICA stenosis.  13. Aortic insufficiency: Moderate by last echo 9/17.  14. Pulmonary HTN: Mixed PAH and pulmonary venous hypertension.  PFTs (9/14) were normal.  V/Q scan (2/16) with no evidence of acute or chronic PE.   - Unable to tolerate Opsumit due  to extensive peripheral edema.  15. OSA: Moderate on 5/16 sleep study. Unable to tolerate CPAP.  Repeat sleep study at [facility] in 2/18 was also suggestive of OSA.  16. Venous insufficiency 17. Ventral hernia 18. Bradycardia: Holter (5/18) with overnight pauses up to 4.7 sec (likely due to OSA), occasional short atrial tachycardia runs, no atrial fibrillation, avg HR 70s, 3% PVCs.  - Event monitor (6/18): Rare 3-4 second pauses at night, rare atrial fibrillation, rare runs of junctional bradycardia.   SH: Married, lives in [place], Engineer, water, nonsmoker  FH: CAD  ROS: All systems reviewed and negative except as per HPI.   Current Outpatient Medications  Medication Sig Dispense Refill   aspirin EC 81 MG tablet Take 1 tablet (81 mg total) by mouth daily. 30 tablet 3   eplerenone (INSPRA) 25 MG tablet TAKE 1 TABLET BY MOUTH ONCE DAILY 30 tablet 11   magnesium gluconate (MAGONATE) 500 MG tablet Take 500 mg by mouth 2 (two) times daily.      Omega-3 Fatty Acids (FISH OIL PO) Take 360 mg by mouth daily.      potassium chloride (K-DUR,KLOR-CON) 20 MEQ tablet Take 2.5 tablets (50 mEq total) by mouth daily. 225 tablet 3   torsemide (DEMADEX) 20 MG tablet Take 3 tablets (60 mg total) by mouth daily. Alternate 41m (3 tabs) and 842m(4 tabs) every other day 270 tablet 6   valsartan (DIOVAN) 40 MG tablet Take 4034mn the morning and 62m42m the evening 270 tablet 3   No current facility-administered medications for this encounter.    BP  124/66    Pulse 75    Wt 69.3 kg (152 lb 12.8 oz)    SpO2 98%    BMI 22.56 kg/m    Wt Readings from Last 3 Encounters:  09/26/18 69.3 kg (152 lb 12.8 oz)  08/25/18 68 kg (150 lb)  08/17/18 66.8 kg (147 lb 3.2 oz)    General: NAD Neck: No JVD, no thyromegaly or thyroid nodule.  Lungs: Clear to auscultation bilaterally with normal respiratory effort. CV: Nondisplaced PMI.  Heart regular S1/S2, no S3/S4, 2/6 SEM RUSB.  1+ edema 1/2 to knees bilaterally.  No carotid bruit.  Normal pedal pulses.  Abdomen: Soft, nontender, no hepatosplenomegaly, no distention.  Skin: Intact without lesions or rashes.  Neurologic: Alert and oriented x 3.  Psych: Normal affect. Extremities: No clubbing or cyanosis.  HEENT: Normal.   Assessment/Plan: 1. CAD: S/p LIMA-LAD.  He has decided not to take statins after reviewing the data (I did recommend taking a statin but we have agreed to disagree on this, he is taking red yeast rice extract).  Lexiscan Cardiolite was done in 9/17 due to atypical chest pain.  This showed prior infarction with no ischemia.  No recurrence of chest pain since that time despite ongoing exercise.   - Continue ASA 81 daily.  2. S/p mitral valve repair: The MV repair looked stable on 7/19 echo with no evidence for significant mitral stenosis and mild MR.  3. Aortic insufficiency: Moderate on 9/18 echo.  Echo in 7/19 with mild-moderate AI.  4. Chronic systolic CHF: Ischemic cardiomyopathy, EF 25% on 7/19 echo, stable.  PA pressure remained elevated by doppler measurement with mildly decreased RV systolic function.  Most recent CPX in 8/19 with moderate HF limitation, mildly worse than prior.  He was volume overloaded on 8/19 RHC with severe pulmonary hypertension but preserved cardiac output.  NYHA class II symptoms, improved recently. He does not  look volume overloaded by exam today (no JVD).   - Continue torsemide 60 mg daily.  Continue KCl 50 mEq daily. BMET today.   - Bisoprolol and  digoxin were both stopped with bradycardia and lightheadedness.   - He did not tolerate Entresto.  Continue valsartan 40 qam/80 qpm, he has not tolerated uptitration.   - Continue eplerenone 25 (unable to tolerate increase).        - He does not qualify for CRT.   - Continue cardiac rehab.  - He is due for a repeat echo, I will arrange.  5. Atrial fibrillation: s/p Maze in 10/14, then ablation in 3/15.  Prior to Maze, he was on Tikosyn but had breakthrough.  Currently, he is not anticoagulated due to history of prostate bleeding and his choice.  He did have his LA appendage oversewn at time of MV surgery.   1% atrial fibrillation on 4/19 event monitor.  Zio patch in 6/20 with short SVT runs (possible atrial fibrillation). - He is off bisoprolol with bradycardia.    - We have had discussions about what to do with his atrial fibrillation.  He is symptomatic at times when in atrial fibrillation/fluttter. He had breakthrough on Tikosyn in the past, but has had Maze and atrial fibrillation ablation since that time. He opted to hold off on trying Tikosyn again and currently feels like the atrial fibrillation burden is manageable.  6. Pulmonary hypertension: Severe by echo in 9/17 and on RHC in 10/17.  RV actually looked ok on 9/17 echo.  Mixed pulmonary venous and pulmonary arterial HTN on RHC.  It is possible that the [facility] component is due to pulmonary vascular remodeling in the setting of chronic mitral regurgitation prior to MV repair. Negative V/Q scan, no evidence for CTEPH.  PFTs normal in 9/14.  He is seeing Dr Lake Bells.  Sleep study showed moderate OSA but he has been unable to tolerate CPAP and is now using an oral device. PASP 76 mmHg by echo in 7/19.  He tried Engineer, site but stopped it so that he could take a nitric oxide supplement.  I repeated RHC in 8/19.  This showed severe mixed pulmonary venous/pulmonary arterial HTN with PVR 5.8 WU and preserved cardiac output. He was started on Opsumit but did not  tolerate due to extensive peripheral edema.  - He wants to continue to take nitric oxide-active herbal and wants to avoid Adcirca => interestingly, his symptoms seemed to improve with this - Breathing improved with Opsumit, but he was unable to tolerate due to severe peripheral edema.     - We discussed a trial of selexipag.  He has wanted to hold off on this as he has felt better recently.  We are going to get an echo, and if PA pressure remains elevated (which I suspect it will), he is willing to start Uptravi. .  7. OSA: Moderate.  Has not tolerated CPAP. Probably plays a role in recurrent atrial fibrillation and pulmonary hypertension as well as nocturnal sinus pauses.  He has been using his oral device. 8. Carotid stenosis: 2/18 carotid dopplers at [facility] with < 50% bilateral stenosis.   9. Bradycardia: Nocturnal bradycardia in the past likely related to OSA.  Developed more ongoing bradycardia and bisoprolol and digoxin stopped.  Restarted recently on bisoprolol with afib/mild RVR but became bradycardic with pre-syncope and bisoprolol stopped. I think that he has sick sinus syndrome.  ECG shows small, diseased-appearing P waves with long PR, also IVCD.  I  suspect he will eventually need a PPM, but currently, his HR is adequate off bisoprolol. - Stay off all nodal blockers.  - If he has any further clinically significant bradycardia, he will need PPM.   Followup in 3 months.   Loralie Champagne 09/26/2018

## 2018-09-26 NOTE — Progress Notes (Addendum)
Daily Session Note  Patient Details  Name: Caleb KIESTER, PhD MRN: [id number] Date of Birth: 04/17/1940 Referring Provider:     [facility] from 06/02/2018 in [facility]  Referring Provider  Loralie Champagne, MD.      Encounter Date: 09/26/2018  Check In: Session Check In - 09/26/18 1426      Check-In   Supervising physician immediately available to respond to emergencies  [facility] immediately available    Physician(s)  Dr. Berle Mull    Location  [facility]    Staff Present  Dorma Russell, MS,ACSM CEP, Exercise Physiologist;Michaelpaul Apo Karle Starch, RN, BSN;Brittany Durene Fruits, BS, ACSM CEP, Exercise Physiologist    Virtual Visit  No    Medication changes reported      No    Fall or balance concerns reported     No    Tobacco Cessation  No Change    Warm-up and Cool-down  Performed on first and last piece of equipment    Resistance Training Performed  Yes    VAD Patient?  No    PAD/SET Patient?  No      Pain Assessment   Currently in Pain?  No/denies    Multiple Pain Sites  No       Capillary Blood Glucose: Results for orders placed or performed during the hospital encounter of 09/26/18 (from the past 24 hour(s))  Basic Metabolic Panel (BMET)     Status: None   Collection Time: 09/26/18 10:51 AM  Result Value Ref Range   Sodium 135 135 - 145 mmol/L   Potassium 4.0 3.5 - 5.1 mmol/L   Chloride 98 98 - 111 mmol/L   CO2 27 22 - 32 mmol/L   Glucose, Bld 88 70 - 99 mg/dL   BUN 22 8 - 23 mg/dL   Creatinine, Ser 1.05 0.61 - 1.24 mg/dL   Calcium 9.4 8.9 - 10.3 mg/dL   GFR calc non Af Amer >60 >60 mL/min   GFR calc Af Amer >60 >60 mL/min   Anion gap 10 5 - 15  B Nat Peptide     Status: Abnormal   Collection Time: 09/26/18 10:51 AM  Result Value Ref Range   B Natriuretic Peptide 406.6 (H) 0.0 - 100.0 pg/mL    Exercise Prescription Changes - 09/26/18 1500      Response to Exercise   Blood Pressure (Admit)   124/56    Blood Pressure (Exercise)  130/72    Blood Pressure (Exit)  120/62    Heart Rate (Admit)  85 bpm    Heart Rate (Exercise)  102 bpm    Heart Rate (Exit)  81 bpm    Rating of Perceived Exertion (Exercise)  13    Perceived Dyspnea (Exercise)  0    Symptoms  None    Comments  Pt's first day of exercise since closure    Duration  Progress to 30 minutes of  aerobic without signs/symptoms of physical distress    Intensity  THRR unchanged      Progression   Progression  Continue to progress workloads to maintain intensity without signs/symptoms of physical distress.    Average METs  2.3      Resistance Training   Training Prescription  Yes    Weight  3lbs    Reps  10-15    Time  10 Minutes      Recumbant Bike   Level  1.5    Minutes  10    METs  1.9      NuStep   Level  2    SPM  85    Minutes  10    METs  2.7      Home Exercise Plan   Plans to continue exercise at  Home (comment)   Walking   Frequency  Add 3 additional days to program exercise sessions.    Initial Home Exercises Provided  06/07/18       Social History   Tobacco Use  Smoking Status Never Smoker  Smokeless Tobacco Never Used    Goals Met:  Exercise tolerated well  Goals Unmet:  Not Applicable  Comments: Patient returned to cardiac rehab after department closure due to COVID 19. Social distancing and safety measures are in place. Patient tolerated his first day of exercise without difficulty. VSS. Telemetry: NSR with 1st degree AV Block. Medication list reconciled. Pt denies barriers to medicaiton compliance.  PSYCHOSOCIAL ASSESSMENT:  PHQ-0. Pt exhibits positive coping skills, hopeful outlook with supportive family. No psychosocial needs identified at this time, no psychosocial interventions necessary.  Pt oriented to exercise equipment and routine.    Understanding verbalized.   Dr. Fransico Him is Medical Director for Cardiac Rehab at [facility].

## 2018-09-26 NOTE — Patient Instructions (Addendum)
Labs done today. We will call you only if labs are abnormal.  Your physician has requested that you have an echocardiogram. Echocardiography is a painless test that uses sound waves to create images of your heart. It provides your doctor with information about the size and shape of your heart and how well your heart's chambers and valves are working. This procedure takes approximately one hour. There are no restrictions for this procedure.  Your physician recommends that you schedule a follow-up appointment in: 3 Months  At the [facility], you and your health needs are our priority. As part of our continuing mission to provide you with exceptional heart care, we have created designated Provider Care Teams. These Care Teams include your primary Cardiologist (physician) and Advanced Practice Providers (APPs- Physician Assistants and Nurse Practitioners) who all work together to provide you with the care you need, when you need it.   You may see any of the following providers on your designated Care Team at your next follow up: Marland Kitchen Dr Glori Bickers . Dr Loralie Champagne . Darrick Grinder, NP

## 2018-09-27 DIAGNOSIS — H353131 Nonexudative age-related macular degeneration, bilateral, early dry stage: Secondary | ICD-10-CM | POA: Diagnosis not present

## 2018-09-27 DIAGNOSIS — H2513 Age-related nuclear cataract, bilateral: Secondary | ICD-10-CM | POA: Diagnosis not present

## 2018-09-27 DIAGNOSIS — H43813 Vitreous degeneration, bilateral: Secondary | ICD-10-CM | POA: Diagnosis not present

## 2018-09-27 LAB — HOMOCYSTEINE: Homocysteine: 15.9 umol/L (ref 0.0–19.2)

## 2018-09-28 ENCOUNTER — Ambulatory Visit (HOSPITAL_COMMUNITY): Payer: Medicare Other

## 2018-09-28 ENCOUNTER — Telehealth (HOSPITAL_COMMUNITY): Payer: Self-pay

## 2018-09-28 ENCOUNTER — Encounter (HOSPITAL_COMMUNITY)
Admission: RE | Admit: 2018-09-28 | Discharge: 2018-09-28 | Disposition: A | Discharge status: Home / Self Care | Payer: Medicare Other | Source: Ambulatory Visit | Attending: Cardiology | Admitting: Cardiology

## 2018-09-28 ENCOUNTER — Other Ambulatory Visit: Payer: Self-pay

## 2018-09-28 DIAGNOSIS — I5022 Chronic systolic (congestive) heart failure: Secondary | ICD-10-CM

## 2018-09-28 NOTE — Telephone Encounter (Signed)
Pt aware of results and appreciative.

## 2018-09-28 NOTE — Telephone Encounter (Signed)
-----  Message from Larey Dresser, MD sent at 09/27/2018 10:41 PM EDT ----- Please give him his homocysteine result

## 2018-09-29 ENCOUNTER — Ambulatory Visit (HOSPITAL_COMMUNITY)
Admission: RE | Admit: 2018-09-29 | Discharge: 2018-09-29 | Disposition: A | Discharge status: Home / Self Care | Payer: Medicare Other | Source: Ambulatory Visit | Attending: Cardiology | Admitting: Cardiology

## 2018-09-29 ENCOUNTER — Other Ambulatory Visit: Payer: Self-pay

## 2018-09-29 DIAGNOSIS — Z951 Presence of aortocoronary bypass graft: Secondary | ICD-10-CM | POA: Insufficient documentation

## 2018-09-29 DIAGNOSIS — I5022 Chronic systolic (congestive) heart failure: Secondary | ICD-10-CM

## 2018-09-29 DIAGNOSIS — I110 Hypertensive heart disease with heart failure: Secondary | ICD-10-CM | POA: Diagnosis not present

## 2018-09-29 DIAGNOSIS — G4730 Sleep apnea, unspecified: Secondary | ICD-10-CM | POA: Diagnosis not present

## 2018-09-29 DIAGNOSIS — E785 Hyperlipidemia, unspecified: Secondary | ICD-10-CM | POA: Diagnosis not present

## 2018-09-29 DIAGNOSIS — I083 Combined rheumatic disorders of mitral, aortic and tricuspid valves: Secondary | ICD-10-CM | POA: Diagnosis not present

## 2018-09-29 NOTE — Progress Notes (Signed)
  Echocardiogram 2D Echocardiogram has been performed.  Johny Chess 09/29/2018, 3:59 PM

## 2018-09-30 ENCOUNTER — Encounter (HOSPITAL_COMMUNITY): Payer: Medicare Other

## 2018-10-03 ENCOUNTER — Encounter (HOSPITAL_COMMUNITY): Payer: Medicare Other

## 2018-10-03 ENCOUNTER — Ambulatory Visit (HOSPITAL_COMMUNITY): Payer: Medicare Other

## 2018-10-03 ENCOUNTER — Encounter (HOSPITAL_COMMUNITY)
Admission: RE | Admit: 2018-10-03 | Discharge: 2018-10-03 | Disposition: A | Discharge status: Home / Self Care | Payer: Medicare Other | Source: Ambulatory Visit | Attending: Cardiology | Admitting: Cardiology

## 2018-10-03 ENCOUNTER — Other Ambulatory Visit: Payer: Self-pay

## 2018-10-03 DIAGNOSIS — I5022 Chronic systolic (congestive) heart failure: Secondary | ICD-10-CM

## 2018-10-03 MED FILL — EPLERENONE 25 MG TABLET: 25 | 30 days supply | Qty: 30 | Fill #6

## 2018-10-05 ENCOUNTER — Ambulatory Visit (HOSPITAL_COMMUNITY): Payer: Medicare Other

## 2018-10-05 ENCOUNTER — Encounter (HOSPITAL_COMMUNITY)
Admission: RE | Admit: 2018-10-05 | Discharge: 2018-10-05 | Disposition: A | Discharge status: Home / Self Care | Payer: Medicare Other | Source: Ambulatory Visit | Attending: Cardiology | Admitting: Cardiology

## 2018-10-05 ENCOUNTER — Other Ambulatory Visit: Payer: Self-pay

## 2018-10-05 ENCOUNTER — Encounter (HOSPITAL_COMMUNITY): Payer: Medicare Other

## 2018-10-05 DIAGNOSIS — I5022 Chronic systolic (congestive) heart failure: Secondary | ICD-10-CM | POA: Diagnosis not present

## 2018-10-07 ENCOUNTER — Encounter (HOSPITAL_COMMUNITY): Payer: Medicare Other

## 2018-10-07 ENCOUNTER — Other Ambulatory Visit: Payer: Self-pay | Admitting: Pulmonary Disease

## 2018-10-10 ENCOUNTER — Ambulatory Visit (HOSPITAL_COMMUNITY): Payer: Medicare Other

## 2018-10-10 ENCOUNTER — Encounter (HOSPITAL_COMMUNITY)
Admission: RE | Admit: 2018-10-10 | Discharge: 2018-10-10 | Disposition: A | Discharge status: Home / Self Care | Payer: Medicare Other | Source: Ambulatory Visit | Attending: Cardiology | Admitting: Cardiology

## 2018-10-10 ENCOUNTER — Other Ambulatory Visit: Payer: Self-pay

## 2018-10-10 ENCOUNTER — Encounter (HOSPITAL_COMMUNITY): Payer: Medicare Other

## 2018-10-10 DIAGNOSIS — I5022 Chronic systolic (congestive) heart failure: Secondary | ICD-10-CM

## 2018-10-12 ENCOUNTER — Ambulatory Visit (HOSPITAL_COMMUNITY): Payer: Medicare Other

## 2018-10-12 ENCOUNTER — Encounter (HOSPITAL_COMMUNITY)
Admission: RE | Admit: 2018-10-12 | Discharge: 2018-10-12 | Disposition: A | Discharge status: Home / Self Care | Payer: Medicare Other | Source: Ambulatory Visit | Attending: Cardiology | Admitting: Cardiology

## 2018-10-12 ENCOUNTER — Encounter (HOSPITAL_COMMUNITY): Payer: Medicare Other

## 2018-10-12 ENCOUNTER — Other Ambulatory Visit: Payer: Self-pay

## 2018-10-12 DIAGNOSIS — I5022 Chronic systolic (congestive) heart failure: Secondary | ICD-10-CM

## 2018-10-13 NOTE — Progress Notes (Signed)
Cardiac Individual Treatment Plan  Patient Details  Name: Caleb TETRAULT, Caleb [name] MRN: [id number] Date of Birth: [birth year] Referring Provider:     [place] from 06/02/2018 in [place]  Referring Provider  Loralie Champagne, MD.      Initial Encounter Date:    CARDIAC REHAB PHASE II ORIENTATION from 06/02/2018 in [place]  Date  06/02/18      Visit Diagnosis: S/P Chronic Systolic Heart Failure   Patient's Home Medications on Admission:  Current Outpatient Medications:  .  aspirin EC 81 MG tablet, Take 1 tablet (81 mg total) by mouth daily., Disp: 30 tablet, Rfl: 3 .  eplerenone (INSPRA) 25 MG tablet, TAKE 1 TABLET BY MOUTH ONCE DAILY, Disp: 30 tablet, Rfl: 11 .  magnesium gluconate (MAGONATE) 500 MG tablet, Take 500 mg by mouth 2 (two) times daily. , Disp: , Rfl:  .  Omega-3 Fatty Acids (FISH OIL PO), Take 360 mg by mouth daily. , Disp: , Rfl:  .  potassium chloride (K-DUR,KLOR-CON) 20 MEQ tablet, Take 2.5 tablets (50 mEq total) by mouth daily., Disp: 225 tablet, Rfl: 3 .  torsemide (DEMADEX) 20 MG tablet, Take 3 tablets (60 mg total) by mouth daily. Alternate 84m (3 tabs) and 854m(4 tabs) every other day, Disp: 270 tablet, Rfl: 6 .  valsartan (DIOVAN) 40 MG tablet, Take 4039mn the morning and 93m65m the evening, Disp: 270 tablet, Rfl: 3  Past Medical History: Past Medical History:  Diagnosis Date  . Allergic rhinitis   . Atrial fibrillation (HCC)[place]. Atypical pneumonia   . CAD (coronary artery disease)   . Cardiomyopathy, ischemic   . Chronic anticoagulation   . Cough   . Dizziness   . GERD (gastroesophageal reflux disease)   . Heart failure, systolic, acute on chronic (HCC)[place]. Hyperlipidemia   . Hypertension   . OSA (obstructive sleep apnea)    Home sleep test 07/05/2009 AHI 8.2  . Pleural effusion   . Positional vertigo   . TIA (transient ischemic attack)     Tobacco  Use: Social History   Tobacco Use  Smoking Status Never Smoker  Smokeless Tobacco Never Used    Labs: Recent Review Flowsheet Data    Labs for ITP Cardiac and Pulmonary Rehab Latest Ref Rng & Units 01/10/2016 01/10/2016 08/17/2017 10/07/2017 11/16/2017   Cholestrol 0 - 200 mg/dL - - 212(H) 206(H) -   LDLCALC 0 - 99 mg/dL - - 140(H) 136(H) -   LDLDIRECT mg/dL - - - - -   HDL >40 mg/dL - - 55 53 -   Trlycerides <150 mg/dL - - 86 84 -   Hemoglobin A1c 4.8 - 5.6 % - - 5.5 - -   PHART 7.350 - 7.450 - - - - -   PCO2ART 35.0 - 45.0 mmHg - - - - -   HCO3 20.0 - 28.0 mmol/L 26.7 26.1 - - 24.8   TCO2 22 - 32 mmol/L 28 27 - - 26   ACIDBASEDEF 0.0 - 2.0 mmol/L - - - - -   O2SAT % 68.0 67.0 - - 69.0      Capillary Blood Glucose: No results found for: GLUCAP   Exercise Target Goals: Exercise Program Goal: Individual exercise prescription set using results from initial 6 min walk test and THRR while considering  patient's activity barriers and safety.   Exercise Prescription Goal: Starting with aerobic activity  30 plus minutes a day, 3 days per week for initial exercise prescription. Provide home exercise prescription and guidelines that participant acknowledges understanding prior to discharge.  Activity Barriers & Risk Stratification: Activity Barriers & Cardiac Risk Stratification - 06/02/18 1015      Activity Barriers & Cardiac Risk Stratification   Activity Barriers  None    Cardiac Risk Stratification  High       6 Minute Walk: 6 Minute Walk    Row Name 06/02/18 1013         6 Minute Walk   Phase  Initial     Distance  1634 feet     Walk Time  6 minutes     # of Rest Breaks  0     MPH  3.1     METS  3.3     RPE  13     Perceived Dyspnea   0     VO2 Peak  11.64     Symptoms  No     Resting HR  84 bpm     Resting BP  110/60     Resting Oxygen Saturation   99 %     Exercise Oxygen Saturation  during 6 min walk  99 %     Max Ex. HR  101 bpm     Max Ex. BP  126/62      2 Minute Post BP  118/72        Oxygen Initial Assessment:   Oxygen Re-Evaluation:   Oxygen Discharge (Final Oxygen Re-Evaluation):   Initial Exercise Prescription: Initial Exercise Prescription - 06/02/18 1000      Date of Initial Exercise RX and Referring Provider   Date  06/02/18    Referring Provider  Loralie Champagne, MD.    Expected Discharge Date  09/07/18      Recumbant Bike   Level  1.5    Watts  25    Minutes  10    METs  3.12      NuStep   Level  2    SPM  85    Minutes  10    METs  3      Track   Laps  13    Minutes  10    METs  3.3      Prescription Details   Frequency (times per week)  3x    Duration  Progress to 30 minutes of continuous aerobic without signs/symptoms of physical distress      Intensity   THRR 40-80% of Max Heartrate  56-113    Ratings of Perceived Exertion  11-13    Perceived Dyspnea  0-4      Progression   Progression  Continue progressive overload as per policy without signs/symptoms or physical distress.      Resistance Training   Training Prescription  Yes    Weight  3lbs    Reps  10-15       Perform Capillary Blood Glucose checks as needed.  Exercise Prescription Changes:  Exercise Prescription Changes    Row Name 06/07/18 1500 09/26/18 1500 10/12/18 1339         Response to Exercise   Blood Pressure (Admit)  -  124/56  122/46     Blood Pressure (Exercise)  -  130/72  142/62     Blood Pressure (Exit)  -  120/62  122/60     Heart Rate (Admit)  -  85 bpm  82 bpm  Heart Rate (Exercise)  -  102 bpm  94 bpm     Heart Rate (Exit)  -  81 bpm  86 bpm     Rating of Perceived Exertion (Exercise)  -  13  11     Perceived Dyspnea (Exercise)  -  0  0     Symptoms  -  None  None     Comments  -  Pt's first day of exercise since closure  -     Duration  -  Progress to 30 minutes of  aerobic without signs/symptoms of physical distress  Continue with 30 min of aerobic exercise without signs/symptoms of physical  distress.     Intensity  -  THRR unchanged  THRR unchanged       Progression   Progression  -  Continue to progress workloads to maintain intensity without signs/symptoms of physical distress.  Continue to progress workloads to maintain intensity without signs/symptoms of physical distress.     Average METs  -  2.3  2.9       Resistance Training   Training Prescription  -  Yes  No Relaxation day, no weights.     Weight  -  3lbs  -     Reps  -  10-15  -     Time  -  10 Minutes  -       Interval Training   Interval Training  -  -  No       Recumbant Bike   Level  -  1.5  2     Watts  -  -  23     Minutes  -  10  15     METs  -  1.9  3.05       NuStep   Level  -  2  3     SPM  -  85  85     Minutes  -  10  15     METs  -  2.7  2.7       Home Exercise Plan   Plans to continue exercise at  Home (comment)  Home (comment) Walking  Home (comment) Walking     Frequency  Add 3 additional days to program exercise sessions.  Add 3 additional days to program exercise sessions.  Add 4 additional days to program exercise sessions.     Initial Home Exercises Provided  06/07/18  06/07/18  06/07/18        Exercise Comments:  Exercise Comments    Row Name 06/07/18 1526 06/14/18 1106 09/26/18 1528 10/12/18 1400     Exercise Comments  Reviewed HEP with Pt. Pt was responsive and understands goals.   pt currently on hold for COVID 19 precautions.    Pt's first day of exercise since departmental closure due to COVID 19. Pt tolerated exercise well. Will continue to monitor pt's progress.  Reviewed goals with patient. Pt is walking and doing resistance training at home in addition to exercise at cardiac rehab.       Exercise Goals and Review:  Exercise Goals    Row Name 06/02/18 1015             Exercise Goals   Increase Physical Activity  Yes       Intervention  Provide advice, education, support and counseling about physical activity/exercise needs.;Develop an individualized exercise  prescription for aerobic and resistive training based on initial evaluation findings, risk stratification, comorbidities  and participant's personal goals.       Expected Outcomes  Short Term: Attend rehab on a regular basis to increase amount of physical activity.;Long Term: Exercising regularly at least 3-5 days a week.;Long Term: Add in home exercise to make exercise part of routine and to increase amount of physical activity.       Able to understand and use rate of perceived exertion (RPE) scale  Yes       Intervention  Provide education and explanation on how to use RPE scale       Expected Outcomes  Short Term: Able to use RPE daily in rehab to express subjective intensity level;Long Term:  Able to use RPE to guide intensity level when exercising independently       Knowledge and understanding of Target Heart Rate Range (THRR)  Yes       Intervention  Provide education and explanation of THRR including how the numbers were predicted and where they are located for reference       Expected Outcomes  Long Term: Able to use THRR to govern intensity when exercising independently;Short Term: Able to state/look up THRR;Short Term: Able to use daily as guideline for intensity in rehab       Able to check pulse independently  Yes       Intervention  Provide education and demonstration on how to check pulse in carotid and radial arteries.;Review the importance of being able to check your own pulse for safety during independent exercise       Expected Outcomes  Short Term: Able to explain why pulse checking is important during independent exercise;Long Term: Able to check pulse independently and accurately       Understanding of Exercise Prescription  Yes       Intervention  Provide education, explanation, and written materials on patient's individual exercise prescription       Expected Outcomes  Short Term: Able to explain program exercise prescription;Long Term: Able to explain home exercise prescription  to exercise independently          Exercise Goals Re-Evaluation : Exercise Goals Re-Evaluation    Row Name 06/07/18 1525 10/12/18 1400           Exercise Goal Re-Evaluation   Exercise Goals Review  Increase Physical Activity;Able to understand and use rate of perceived exertion (RPE) scale;Knowledge and understanding of Target Heart Rate Range (THRR);Understanding of Exercise Prescription;Increase Strength and Stamina  Increase Physical Activity;Able to understand and use rate of perceived exertion (RPE) scale;Knowledge and understanding of Target Heart Rate Range (THRR);Understanding of Exercise Prescription;Increase Strength and Stamina      Comments  Reviewed HEP with Pt. Pt was responsive and understands THRR, RPE scale, weather precautions, end points of exericse, and NTG use. Pt is walking at home 30-45 minutes 5-7 days a week in addition to Cardiac Rehab.   Patient is walking at least 20-30 minutes 5-7 days/week. Pt also has 10lb weights at home that he uses for his resistance training.      Expected Outcomes  Will continue to monitor and progress Pt as tolerated.   Increase workloads as tolerated to help increase strength and stamina.          Discharge Exercise Prescription (Final Exercise Prescription Changes): Exercise Prescription Changes - 10/12/18 1339      Response to Exercise   Blood Pressure (Admit)  122/46    Blood Pressure (Exercise)  142/62    Blood Pressure (Exit)  122/60  Heart Rate (Admit)  82 bpm    Heart Rate (Exercise)  94 bpm    Heart Rate (Exit)  86 bpm    Rating of Perceived Exertion (Exercise)  11    Perceived Dyspnea (Exercise)  0    Symptoms  None    Duration  Continue with 30 min of aerobic exercise without signs/symptoms of physical distress.    Intensity  THRR unchanged      Progression   Progression  Continue to progress workloads to maintain intensity without signs/symptoms of physical distress.    Average METs  2.9      Resistance  Training   Training Prescription  No   Relaxation day, no weights.   Time  --      Interval Training   Interval Training  No      Recumbant Bike   Level  2    Watts  23    Minutes  15    METs  3.05      NuStep   Level  3    SPM  85    Minutes  15    METs  2.7      Home Exercise Plan   Plans to continue exercise at  Home (comment)   Walking   Frequency  Add 4 additional days to program exercise sessions.    Initial Home Exercises Provided  06/07/18       Nutrition:  Target Goals: Understanding of nutrition guidelines, daily intake of sodium <1536m, cholesterol <2049m calories 30% from fat and 7% or less from saturated fats, daily to have 5 or more servings of fruits and vegetables.  Biometrics: Pre Biometrics - 06/02/18 1014      Pre Biometrics   Height  _0  (1.753 m)    Weight  68.1 kg    Waist Circumference  34.5 inches    Hip Circumference  37.5 inches    Waist to Hip Ratio  0.92 %    BMI (Calculated)  22.16    Triceps Skinfold  18 mm    % Body Fat  24 %    Grip Strength  34 kg    Flexibility  0 in    Single Leg Stand  1.85 seconds        Nutrition Therapy Plan and Nutrition Goals: Nutrition Therapy & Goals - 06/02/18 1114      Nutrition Therapy   Diet  heart healthy      Personal Nutrition Goals   Nutrition Goal  Pt to identify and limit food sources of sodium, saturated fat, trans fat, and refined carbohydrates      Intervention Plan   Intervention  Prescribe, educate and counsel regarding individualized specific dietary modifications aiming towards targeted core components such as weight, hypertension, lipid management, diabetes, heart failure and other comorbidities.    Expected Outcomes  Short Term Goal: Understand basic principles of dietary content, such as calories, fat, sodium, cholesterol and nutrients.;Long Term Goal: Adherence to prescribed nutrition plan.       Nutrition Assessments: Nutrition Assessments - 06/02/18 1114       MEDFICTS Scores   Pre Score  15       Nutrition Goals Re-Evaluation:   Nutrition Goals Discharge (Final Nutrition Goals Re-Evaluation):   Psychosocial: Target Goals: Acknowledge presence or absence of significant depression and/or stress, maximize coping skills, provide positive support system. Participant is able to verbalize types and ability to use techniques and skills needed for reducing stress and depression.  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  Yes      Barriers   Psychosocial barriers to participate in program  There are no identifiable barriers or psychosocial needs.      Screening Interventions   Interventions  Encouraged to exercise       Quality of Life Scores: Quality of Life - 06/02/18 1019      Quality of Life   Select  Quality of Life      Quality of Life Scores   Health/Function Pre  12.9 %    Socioeconomic Pre  28.75 %    Psych/Spiritual Pre  19.67 %    Psych/Spiritual Post  24 %    Psych/Spiritual % Change  22.01 %    Family Post  18.88 %      Scores of 19 and below usually indicate a poorer quality of life in these areas.  A difference of  2-3 points is a clinically meaningful difference.  A difference of 2-3 points in the total score of the Quality of Life Index has been associated with significant improvement in overall quality of life, self-image, physical symptoms, and general health in studies assessing change in quality of life.  PHQ-9: Recent Review Flowsheet Data    Depression screen [facility] 2/9 09/27/2018 03/29/2018 01/19/2018 02/27/2016   Decreased Interest 0 0 0 0   Down, Depressed, Hopeless 0 0 0 0   PHQ - 2 Score 0 0 0 0     Interpretation of Total Score  Total Score Depression Severity:  1-4 = Minimal depression, 5-9 = Mild depression,  10-14 = Moderate depression, 15-19 = Moderately severe depression, 20-27 = Severe depression   Psychosocial Evaluation and Intervention:   Psychosocial Re-Evaluation: Psychosocial Re-Evaluation    [place] Name 10/13/18 1555             Psychosocial Re-Evaluation   Current issues with  None Identified       Comments  no psychosocial needs identified, no interventions necessary       Expected Outcomes  pt will exhibit positive outlook with good coping skills.        Interventions  Encouraged to attend Cardiac Rehabilitation for the exercise       Continue Psychosocial Services   No Follow up required          Psychosocial Discharge (Final Psychosocial Re-Evaluation): Psychosocial Re-Evaluation - 10/13/18 1555      Psychosocial Re-Evaluation   Current issues with  None Identified    Comments  no psychosocial needs identified, no interventions necessary    Expected Outcomes  pt will exhibit positive outlook with good coping skills.     Interventions  Encouraged to attend Cardiac Rehabilitation for the exercise    Continue Psychosocial Services   No Follow up required       Vocational Rehabilitation: Provide vocational rehab assistance to qualifying candidates.   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 Participant will state understanding/return demonstration of topics presented.  Learning Barriers/Preferences: Learning Barriers/Preferences - 06/02/18 1018      Learning Barriers/Preferences   Learning Barriers  None    Learning Preferences  Skilled Demonstration;Individual Instruction;Written Material       Education  Topics: Hypertension, Hypertension Reduction -Define heart disease and high blood pressure. Discus how high blood pressure affects the body and ways to reduce high blood pressure.   Exercise and Your Heart -Discuss why it is important to exercise, the FITT principles of exercise, normal and abnormal responses to exercise, and how to exercise safely.   Angina -Discuss definition of angina, causes of angina, treatment of angina, and how to decrease risk of having angina.   Cardiac Medications -Review what the following cardiac medications are used for, how they affect the body, and side effects that may occur when taking the medications.  Medications include Aspirin, Beta blockers, calcium channel blockers, ACE Inhibitors, angiotensin receptor blockers, diuretics, digoxin, and antihyperlipidemics.   Congestive Heart Failure -Discuss the definition of CHF, how to live with CHF, the signs and symptoms of CHF, and how keep track of weight and sodium intake.   Heart Disease and Intimacy -Discus the effect sexual activity has on the heart, how changes occur during intimacy as we age, and safety during sexual activity.   Smoking Cessation / COPD -Discuss different methods to quit smoking, the health benefits of quitting smoking, and the definition of COPD.   Nutrition I: Fats -Discuss the types of cholesterol, what cholesterol does to the heart, and how cholesterol levels can be controlled.   Nutrition II: Labels -Discuss the different components of food labels and how to read food label   Heart Parts/Heart Disease and PAD -Discuss the anatomy of the heart, the pathway of blood circulation through the heart, and these are affected by heart disease.   Stress I: Signs and Symptoms -Discuss the causes of stress, how stress may lead to anxiety and depression, and ways to limit stress.   Stress II: Relaxation -Discuss different types of relaxation techniques to limit  stress.   Warning Signs of Stroke / TIA -Discuss definition of a stroke, what the signs and symptoms are of a stroke, and how to identify when someone is having stroke.   Knowledge Questionnaire Score: Knowledge Questionnaire Score - 06/02/18 1018      Knowledge Questionnaire Score   Pre Score  21/24       Core Components/Risk Factors/Patient Goals at Admission: Personal Goals and Risk Factors at Admission - 06/02/18 1019      Core Components/Risk Factors/Patient Goals on Admission    Weight Management  Weight Gain;Weight Maintenance    Improve shortness of breath with ADL's  Yes    Intervention  Provide education, individualized exercise plan and daily activity instruction to help decrease symptoms of SOB with activities of daily living.    Expected Outcomes  Short Term: Improve cardiorespiratory fitness to achieve a reduction of symptoms when performing ADLs;Long Term: Be able to perform more ADLs without symptoms or delay the onset of symptoms    Heart Failure  Yes    Intervention  Provide a combined exercise and nutrition program that is supplemented with education, support and counseling about heart failure. Directed toward relieving symptoms such as shortness of breath, decreased exercise tolerance, and extremity edema.    Expected Outcomes  Improve functional capacity of life;Short term: Attendance in program 2-3 days a week  with increased exercise capacity. Reported lower sodium intake. Reported increased fruit and vegetable intake. Reports medication compliance.;Short term: Daily weights obtained and reported for increase. Utilizing diuretic protocols set by physician.;Long term: Adoption of self-care skills and reduction of barriers for early signs and symptoms recognition and intervention leading to self-care maintenance.    Lipids  Yes    Intervention  Provide education and support for participant on nutrition & aerobic/resistive exercise along with prescribed medications to  achieve LDL <77m, HDL >470m    Expected Outcomes  Short Term: Participant states understanding of desired cholesterol values and is compliant with medications prescribed. Participant is following exercise prescription and nutrition guidelines.;Long Term: Cholesterol controlled with medications as prescribed, with individualized exercise RX and with personalized nutrition plan. Value goals: LDL < 7084mHDL > 40 mg.    Stress  Yes    Intervention  Offer individual and/or small group education and counseling on adjustment to heart disease, stress management and health-related lifestyle change. Teach and support self-help strategies.;Refer participants experiencing significant psychosocial distress to appropriate mental health specialists for further evaluation and treatment. When possible, include family members and significant others in education/counseling sessions.    Expected Outcomes  Short Term: Participant demonstrates changes in health-related behavior, relaxation and other stress management skills, ability to obtain effective social support, and compliance with psychotropic medications if prescribed.;Long Term: Emotional wellbeing is indicated by absence of clinically significant psychosocial distress or social isolation.       Core Components/Risk Factors/Patient Goals Review:  Goals and Risk Factor Review    Row Name 10/13/18 1555             Core Components/Risk Factors/Patient Goals Review   Personal Goals Review  Improve shortness of breath with ADL's;Hypertension;Lipids;Stress       Review  Cur[place]ons been doing well with exercise since cardiac rehab restarted. [place] vital signs have been stable       Expected Outcomes  pt will participate in exercise, nutrition and lifestyle modification to decrease disease progress and improved quality of life          Core Components/Risk Factors/Patient Goals at Discharge (Final Review):  Goals and Risk Factor Review - 10/13/18 1555       Core Components/Risk Factors/Patient Goals Review   Personal Goals Review  Improve shortness of breath with ADL's;Hypertension;Lipids;Stress    Review  [place] been doing well with exercise since cardiac rehab restarted. [place] vital signs have been stable    Expected Outcomes  pt will participate in exercise, nutrition and lifestyle modification to decrease disease progress and improved quality of life       ITP Comments: ITP Comments    Row Name 06/02/18 1013 06/14/18 1104 10/13/18 1600       ITP Comments  Dr. TraFransico Himedical Director   30 day ITP.  pt currently on hold for COVID 19 precautions.    30 Day ITP Review. Patient has been with good participation and attendance since cardiac rehab resumed        Comments: See ITP comments.MarBarnet PallN,BSN 10/13/2018 4:04 PM

## 2018-10-14 ENCOUNTER — Other Ambulatory Visit (HOSPITAL_COMMUNITY)
Admission: RE | Admit: 2018-10-14 | Discharge: 2018-10-14 | Disposition: A | Discharge status: Home / Self Care | Payer: Medicare Other | Source: Ambulatory Visit | Attending: Pulmonary Disease | Admitting: Pulmonary Disease

## 2018-10-14 ENCOUNTER — Encounter (HOSPITAL_COMMUNITY): Payer: Medicare Other

## 2018-10-14 DIAGNOSIS — Z20828 Contact with and (suspected) exposure to other viral communicable diseases: Secondary | ICD-10-CM | POA: Insufficient documentation

## 2018-10-15 LAB — SARS CORONAVIRUS 2 (TAT 6-24 HRS): SARS Coronavirus 2: NEGATIVE

## 2018-10-17 ENCOUNTER — Ambulatory Visit (HOSPITAL_COMMUNITY): Payer: Medicare Other

## 2018-10-17 ENCOUNTER — Encounter (HOSPITAL_COMMUNITY): Payer: Medicare Other

## 2018-10-17 ENCOUNTER — Other Ambulatory Visit: Payer: Self-pay

## 2018-10-17 ENCOUNTER — Encounter (HOSPITAL_COMMUNITY)
Admission: RE | Admit: 2018-10-17 | Discharge: 2018-10-17 | Disposition: A | Discharge status: Home / Self Care | Payer: Medicare Other | Source: Ambulatory Visit | Attending: Cardiology | Admitting: Cardiology

## 2018-10-17 DIAGNOSIS — I5022 Chronic systolic (congestive) heart failure: Secondary | ICD-10-CM

## 2018-10-18 ENCOUNTER — Ambulatory Visit (INDEPENDENT_AMBULATORY_CARE_PROVIDER_SITE_OTHER): Payer: Medicare Other | Admitting: Adult Health

## 2018-10-18 ENCOUNTER — Other Ambulatory Visit: Payer: Self-pay

## 2018-10-18 ENCOUNTER — Encounter: Payer: Self-pay | Admitting: Adult Health

## 2018-10-18 ENCOUNTER — Ambulatory Visit (INDEPENDENT_AMBULATORY_CARE_PROVIDER_SITE_OTHER): Payer: Medicare Other | Admitting: Pulmonary Disease

## 2018-10-18 DIAGNOSIS — I272 Pulmonary hypertension, unspecified: Secondary | ICD-10-CM

## 2018-10-18 DIAGNOSIS — I5022 Chronic systolic (congestive) heart failure: Secondary | ICD-10-CM | POA: Diagnosis not present

## 2018-10-18 DIAGNOSIS — G4733 Obstructive sleep apnea (adult) (pediatric): Secondary | ICD-10-CM | POA: Diagnosis not present

## 2018-10-18 LAB — PULMONARY FUNCTION TEST
DL/VA % pred: 111 %
DL/VA: 4.37 ml/min/mmHg/L
DLCO unc % pred: 118 %
DLCO unc: 28.1 ml/min/mmHg
FEF 25-75 Post: 2.99 L/sec
FEF 25-75 Pre: 1.81 L/sec
FEF2575-%Change-Post: 64 %
FEF2575-%Pred-Post: 154 %
FEF2575-%Pred-Pre: 93 %
FEV1-%Change-Post: 6 %
FEV1-%Pred-Post: 105 %
FEV1-%Pred-Pre: 99 %
FEV1-Post: 2.94 L
FEV1-Pre: 2.76 L
FEV1FVC-%Change-Post: 4 %
FEV1FVC-%Pred-Pre: 105 %
FEV6-%Change-Post: 2 %
FEV6-%Pred-Post: 102 %
FEV6-%Pred-Pre: 99 %
FEV6-Post: 3.71 L
FEV6-Pre: 3.62 L
FEV6FVC-%Change-Post: 0 %
FEV6FVC-%Pred-Post: 107 %
FEV6FVC-%Pred-Pre: 106 %
FVC-%Change-Post: 1 %
FVC-%Pred-Post: 95 %
FVC-%Pred-Pre: 93 %
FVC-Post: 3.72 L
FVC-Pre: 3.65 L
Post FEV1/FVC ratio: 79 %
Post FEV6/FVC ratio: 100 %
Pre FEV1/FVC ratio: 76 %
Pre FEV6/FVC Ratio: 99 %
RV % pred: 134 %
RV: 3.48 L
TLC % pred: 105 %
TLC: 7.23 L

## 2018-10-18 NOTE — Patient Instructions (Addendum)
Continue on Demadex.  Low salt diet  Keep legs elevated.  Continue with rehab .  Continue with oral appliance with sleep.  Follow up with cardiology as discussed.  Follow up with Dr. [place] Bells or Djeneba Barsch NP in 3 months and As needed

## 2018-10-18 NOTE — Progress Notes (Signed)
Full PFT performed today.

## 2018-10-18 NOTE — Progress Notes (Signed)
_0  ID: Caleb Moment, PhD, male    DOB: 09-17-1940, 78 y.o.   MRN: [id number]  Chief Complaint  Patient presents with  . Follow-up    Referring provider: Marin Olp, MD  HPI: 78 year old male followed for pulmonary hypertension Medical history significant for A. fib, hypertension, ischemic cardiomyopathy with mitral valve repair, coronary disease with CABG, congestive heart failure  TEST/EVENTS :   10/18/2018 follow-up pulmonary hypertension Patient presents for a 59-monthfollow-up.  Patient has known severe pulmonary hypertension followed by cardiology.  Has previously tried OPSUMIT but was unable to tolerate due to significant lower extremity swelling.  Has previously tried AEngineer, sitewith no perceived benefit.  Patient is followed by cardiology, echo in July 2019 showed EF at 25%, pulmonary artery pressure 76 mmHg.  Right heart cath August 2019 showed severe pulmonary hypertension.  Currently on Demadex 60 mg daily.  Echo was repeated September 29, 2018 that showed EF had decreased at 10 to 15%.  Moderately severe l dilated eft ventricle.  Pulmonary artery pressure has improved at 40 mmHg Says overall leg swelling has been manageable.  Shortness of breath is actually been slightly improved.  Has returned back to cardiac rehab.  Patient is taken some over-the-counter supplements.  Pulmonary function testing today showed normal lung function with no airflow obstruction or restriction. No sign BD response . DLCO normal  Patient does have underlying sleep apnea uses oral appliance.    Allergies  Allergen Reactions  . Carvedilol Other (See Comments)    Dizziness, uneasiness, alopecia  . Codeine     Rapid thoughts, hallucinations  . Opsumit [Macitentan] Swelling    Per patient, caused extreme ankle swelling.   . Dabigatran Rash  . Pradaxa [Dabigatran Etexilate Mesylate] Rash  . Sulfonamide Derivatives Rash  . Xarelto [Rivaroxaban] Rash    Immunization History  Administered  Date(s) Administered  . Influenza Split 12/24/2014  . Influenza Whole 12/17/2008, 01/27/2010, 12/21/2011  . Influenza, High Dose Seasonal PF 01/08/2016, 12/28/2016, 01/13/2018  . Influenza,inj,Quad PF,6+ Mos 11/25/2012, 01/09/2014  . Influenza-Unspecified 12/22/2014  . Pneumococcal Polysaccharide-23 05/21/2008  . Tdap 10/20/2012    Past Medical History:  Diagnosis Date  . Allergic rhinitis   . Atrial fibrillation ([place]   . Atypical pneumonia   . CAD (coronary artery disease)   . Cardiomyopathy, ischemic   . Chronic anticoagulation   . Cough   . Dizziness   . GERD (gastroesophageal reflux disease)   . Heart failure, systolic, acute on chronic ([place]   . Hyperlipidemia   . Hypertension   . OSA (obstructive sleep apnea)    Home sleep test 07/05/2009 AHI 8.2  . Pleural effusion   . Positional vertigo   . TIA (transient ischemic attack)     Tobacco History: Social History   Tobacco Use  Smoking Status Never Smoker  Smokeless Tobacco Never Used   Counseling given: Not Answered   Outpatient Medications Prior to Visit  Medication Sig Dispense Refill  . aspirin EC 81 MG tablet Take 1 tablet (81 mg total) by mouth daily. 30 tablet 3  . eplerenone (INSPRA) 25 MG tablet TAKE 1 TABLET BY MOUTH ONCE DAILY 30 tablet 11  . magnesium gluconate (MAGONATE) 500 MG tablet Take 500 mg by mouth 2 (two) times daily.     . Omega-3 Fatty Acids (FISH OIL PO) Take 360 mg by mouth daily.     . potassium chloride (K-DUR,KLOR-CON) 20 MEQ tablet Take 2.5 tablets (50 mEq total) by mouth daily. 2[place]  tablet 3  . torsemide (DEMADEX) 20 MG tablet Take 3 tablets (60 mg total) by mouth daily. Alternate 55m (3 tabs) and 838m(4 tabs) every other day 270 tablet 6  . valsartan (DIOVAN) 40 MG tablet Take 4068mn the morning and 70m26m the evening 270 tablet 3   No facility-administered medications prior to visit.      Review of Systems:   Constitutional:   No  weight loss, night sweats,  Fevers,  chills, + fatigue, or  lassitude.  HEENT:   No headaches,  Difficulty swallowing,  Tooth/dental problems, or  Sore throat,                No sneezing, itching, ear ache, nasal congestion, post nasal drip,   CV:  No chest pain,  Orthopnea, PND, swelling in lower extremities, anasarca, dizziness, palpitations, syncope.   GI  No heartburn, indigestion, abdominal pain, nausea, vomiting, diarrhea, change in bowel habits, loss of appetite, bloody stools.   Resp:   No excess mucus, no productive cough,  No non-productive cough,  No coughing up of blood.  No change in color of mucus.  No wheezing.  No chest wall deformity  Skin: no rash or lesions.  GU: no dysuria, change in color of urine, no urgency or frequency.  No flank pain, no hematuria   MS:  No joint pain or swelling.  No decreased range of motion.  No back pain.    Physical Exam  BP 122/72 (BP Location: Left Arm, Cuff Size: Normal)   Pulse 81   Temp 98.1 F (36.7 C) (Oral)   Ht _0  (1.753 m)   Wt 151 lb (68.5 kg)   SpO2 99%   BMI 22.30 kg/m   GEN: A/Ox3; pleasant , NAD, elderly    HEENT:  /AT,   NOSE-clear, THROAT-clear, no lesions, no postnasal drip or exudate noted.   NECK:  Supple w/ fair ROM; no JVD; normal carotid impulses w/o bruits; no thyromegaly or nodules palpated; no lymphadenopathy.    RESP  Clear  P & A; w/o, wheezes/ rales/ or rhonchi. no accessory muscle use, no dullness to percussion  CARD:  RRR, no m/r/g, tr -1 + peripheral edema, pulses intact, no cyanosis or clubbing.  GI:   Soft & nt; nml bowel sounds; no organomegaly or masses detected.   Musco: Warm bil, no deformities or joint swelling noted.   Neuro: alert, no focal deficits noted.    Skin: Warm, no lesions or rashes    Lab Results:  CBC    Component Value Date/Time   WBC 5.7 08/17/2018 1143   RBC 4.74 08/17/2018 1143   HGB 14.4 08/17/2018 1143   HCT 44.4 08/17/2018 1143   PLT 204 08/17/2018 1143   MCV 93.7 08/17/2018 1143    MCH 30.4 08/17/2018 1143   MCHC 32.4 08/17/2018 1143   RDW 15.8 (H) 08/17/2018 1143   LYMPHSABS 1.6 02/24/2018 1337   MONOABS 1.6 (H) 02/24/2018 1337   EOSABS 0.1 02/24/2018 1337   BASOSABS 0.1 02/24/2018 1337    BMET    Component Value Date/Time   NA 135 09/26/2018 1051   K 4.0 09/26/2018 1051   CL 98 09/26/2018 1051   CO2 27 09/26/2018 1051   GLUCOSE 88 09/26/2018 1051   BUN 22 09/26/2018 1051   CREATININE 1.05 09/26/2018 1051   CREATININE 0.99 11/19/2014 1020   CALCIUM 9.4 09/26/2018 1051   GFRNONAA >60 09/26/2018 1051   GFRAA >60 09/26/2018 1051  BNP    Component Value Date/Time   BNP 406.6 (H) 09/26/2018 1051   BNP 304.7 (H) 11/19/2014 1020    ProBNP    Component Value Date/Time   PROBNP 644.0 (H) 02/24/2018 1337    Imaging: No results found.    PFT Results Latest Ref Rng & Units 10/18/2018  FVC-Pre L 3.65  FVC-Predicted Pre % 93  FVC-Post L 3.72  FVC-Predicted Post % 95  Pre FEV1/FVC % % 76  Post FEV1/FCV % % 79  FEV1-Pre L 2.76  FEV1-Predicted Pre % 99  FEV1-Post L 2.94  DLCO UNC% % 118  DLCO COR %Predicted % 111  TLC L 7.23  TLC % Predicted % 105  RV % Predicted % 134    No results found for: NITRICOXIDE      Assessment & Plan:   No problem-specific Assessment & Plan notes found for this encounter.     Rexene Edison, NP 10/18/2018

## 2018-10-19 ENCOUNTER — Ambulatory Visit (HOSPITAL_COMMUNITY): Payer: Medicare Other

## 2018-10-19 ENCOUNTER — Encounter (HOSPITAL_COMMUNITY)
Admission: RE | Admit: 2018-10-19 | Discharge: 2018-10-19 | Disposition: A | Discharge status: Home / Self Care | Payer: Medicare Other | Source: Ambulatory Visit | Attending: Cardiology | Admitting: Cardiology

## 2018-10-19 ENCOUNTER — Encounter (HOSPITAL_COMMUNITY): Payer: Medicare Other

## 2018-10-19 DIAGNOSIS — I5022 Chronic systolic (congestive) heart failure: Secondary | ICD-10-CM

## 2018-10-19 NOTE — Progress Notes (Signed)
Reviewed, agree 

## 2018-10-19 NOTE — Assessment & Plan Note (Signed)
Severe pulmonary HTN , previous intolerant to OPSUMIT . No perceived benefit with Adcirca. Clinically doing well on current regimen with demadex.  Most recent echo shows significant drop in [facility] , unclear if this is correct.  Will cont on current regimen   Plan  Patient Instructions  Continue on Demadex.  Low salt diet  Keep legs elevated.  Continue with rehab .  Continue with oral appliance with sleep.  Follow up with cardiology as discussed.  Follow up with Dr. [place] Bells or [place] NP in 3 months and As needed

## 2018-10-19 NOTE — Assessment & Plan Note (Signed)
Intolerant to CPAP  Cont on oral appliance At bedtime

## 2018-10-19 NOTE — Assessment & Plan Note (Signed)
Echo shows worsening pump function /decreased EF .  Cont follow up with cards  Continue on demadex.

## 2018-10-21 ENCOUNTER — Encounter (HOSPITAL_COMMUNITY): Payer: Medicare Other

## 2018-10-24 ENCOUNTER — Other Ambulatory Visit: Payer: Self-pay

## 2018-10-24 ENCOUNTER — Ambulatory Visit (HOSPITAL_COMMUNITY): Payer: Medicare Other

## 2018-10-24 ENCOUNTER — Encounter (HOSPITAL_COMMUNITY): Payer: Medicare Other

## 2018-10-24 ENCOUNTER — Encounter (HOSPITAL_COMMUNITY)
Admission: RE | Admit: 2018-10-24 | Discharge: 2018-10-24 | Disposition: A | Discharge status: Home / Self Care | Payer: Medicare Other | Source: Ambulatory Visit | Attending: Cardiology | Admitting: Cardiology

## 2018-10-24 DIAGNOSIS — I5022 Chronic systolic (congestive) heart failure: Secondary | ICD-10-CM

## 2018-10-25 DIAGNOSIS — J343 Hypertrophy of nasal turbinates: Secondary | ICD-10-CM | POA: Diagnosis not present

## 2018-10-25 DIAGNOSIS — J342 Deviated nasal septum: Secondary | ICD-10-CM | POA: Diagnosis not present

## 2018-10-25 DIAGNOSIS — J310 Chronic rhinitis: Secondary | ICD-10-CM | POA: Diagnosis not present

## 2018-10-26 ENCOUNTER — Encounter (HOSPITAL_COMMUNITY): Payer: Medicare Other

## 2018-10-26 ENCOUNTER — Other Ambulatory Visit: Payer: Self-pay

## 2018-10-26 ENCOUNTER — Ambulatory Visit (HOSPITAL_COMMUNITY): Payer: Medicare Other

## 2018-10-26 ENCOUNTER — Encounter (HOSPITAL_COMMUNITY)
Admission: RE | Admit: 2018-10-26 | Discharge: 2018-10-26 | Disposition: A | Discharge status: Home / Self Care | Payer: Medicare Other | Source: Ambulatory Visit | Attending: Cardiology | Admitting: Cardiology

## 2018-10-26 DIAGNOSIS — I5022 Chronic systolic (congestive) heart failure: Secondary | ICD-10-CM

## 2018-10-28 ENCOUNTER — Encounter (HOSPITAL_COMMUNITY): Payer: Medicare Other

## 2018-10-31 ENCOUNTER — Other Ambulatory Visit: Payer: Self-pay

## 2018-10-31 ENCOUNTER — Ambulatory Visit (HOSPITAL_COMMUNITY): Payer: Medicare Other

## 2018-10-31 ENCOUNTER — Encounter (HOSPITAL_COMMUNITY): Payer: Medicare Other

## 2018-10-31 ENCOUNTER — Encounter (HOSPITAL_COMMUNITY)
Admission: RE | Admit: 2018-10-31 | Discharge: 2018-10-31 | Disposition: A | Discharge status: Home / Self Care | Payer: Medicare Other | Source: Ambulatory Visit | Attending: Cardiology | Admitting: Cardiology

## 2018-10-31 DIAGNOSIS — I5022 Chronic systolic (congestive) heart failure: Secondary | ICD-10-CM | POA: Diagnosis not present

## 2018-11-02 ENCOUNTER — Encounter (HOSPITAL_COMMUNITY): Payer: Medicare Other

## 2018-11-02 ENCOUNTER — Encounter (HOSPITAL_COMMUNITY)
Admission: RE | Admit: 2018-11-02 | Discharge: 2018-11-02 | Disposition: A | Discharge status: Home / Self Care | Payer: Medicare Other | Source: Ambulatory Visit | Attending: Cardiology | Admitting: Cardiology

## 2018-11-02 ENCOUNTER — Other Ambulatory Visit: Payer: Self-pay

## 2018-11-02 ENCOUNTER — Ambulatory Visit (HOSPITAL_COMMUNITY): Payer: Medicare Other

## 2018-11-02 DIAGNOSIS — I5022 Chronic systolic (congestive) heart failure: Secondary | ICD-10-CM

## 2018-11-03 MED FILL — EPLERENONE 25 MG TABLET: 25 | 30 days supply | Qty: 30 | Fill #7

## 2018-11-04 ENCOUNTER — Encounter (HOSPITAL_COMMUNITY): Payer: Medicare Other

## 2018-11-07 ENCOUNTER — Ambulatory Visit (HOSPITAL_COMMUNITY): Payer: Medicare Other

## 2018-11-07 ENCOUNTER — Encounter (HOSPITAL_COMMUNITY)
Admission: RE | Admit: 2018-11-07 | Discharge: 2018-11-07 | Disposition: A | Discharge status: Home / Self Care | Payer: Medicare Other | Source: Ambulatory Visit | Attending: Cardiology | Admitting: Cardiology

## 2018-11-07 ENCOUNTER — Other Ambulatory Visit: Payer: Self-pay

## 2018-11-07 ENCOUNTER — Encounter (HOSPITAL_COMMUNITY): Payer: Medicare Other

## 2018-11-07 DIAGNOSIS — I5022 Chronic systolic (congestive) heart failure: Secondary | ICD-10-CM | POA: Diagnosis not present

## 2018-11-09 ENCOUNTER — Encounter (HOSPITAL_COMMUNITY)
Admission: RE | Admit: 2018-11-09 | Discharge: 2018-11-09 | Disposition: A | Discharge status: Home / Self Care | Payer: Medicare Other | Source: Ambulatory Visit | Attending: Cardiology | Admitting: Cardiology

## 2018-11-09 ENCOUNTER — Other Ambulatory Visit: Payer: Self-pay

## 2018-11-09 ENCOUNTER — Ambulatory Visit (HOSPITAL_COMMUNITY): Payer: Medicare Other

## 2018-11-09 ENCOUNTER — Encounter (HOSPITAL_COMMUNITY): Payer: Medicare Other

## 2018-11-09 VITALS — Ht 69.0 in | Wt 150.8 lb

## 2018-11-09 DIAGNOSIS — I5022 Chronic systolic (congestive) heart failure: Secondary | ICD-10-CM

## 2018-11-09 NOTE — Progress Notes (Signed)
Discharge Progress Report  Patient Details  Name: Caleb VELARDI, PhD MRN: [id number] Date of Birth: [birth year] Referring Provider:     [place] from 06/02/2018 in [place]  Referring Provider  Loralie Champagne, MD.       Number of Visits: 15  Reason for Discharge:  Patient reached a stable level of exercise.  Smoking History:  Social History   Tobacco Use  Smoking Status Never Smoker  Smokeless Tobacco Never Used    Diagnosis:  23/7628 Chronic systolic congestive heart failure ([place])  ADL UCSD:   Initial Exercise Prescription: Initial Exercise Prescription - 06/02/18 1000      Date of Initial Exercise RX and Referring Provider   Date  06/02/18    Referring Provider  Loralie Champagne, MD.    Expected Discharge Date  09/07/18      Recumbant Bike   Level  1.5    Watts  25    Minutes  10    METs  3.12      NuStep   Level  2    SPM  85    Minutes  10    METs  3      Track   Laps  13    Minutes  10    METs  3.3      Prescription Details   Frequency (times per week)  3x    Duration  Progress to 30 minutes of continuous aerobic without signs/symptoms of physical distress      Intensity   THRR 40-80% of Max Heartrate  56-113    Ratings of Perceived Exertion  11-13    Perceived Dyspnea  0-4      Progression   Progression  Continue progressive overload as per policy without signs/symptoms or physical distress.      Resistance Training   Training Prescription  Yes    Weight  3lbs    Reps  10-15       Discharge Exercise Prescription (Final Exercise Prescription Changes): Exercise Prescription Changes - 11/09/18 1500      Response to Exercise   Blood Pressure (Admit)  116/62    Blood Pressure (Exercise)  148/72    Blood Pressure (Exit)  108/62    Heart Rate (Admit)  75 bpm    Heart Rate (Exercise)  114 bpm    Heart Rate (Exit)  78 bpm    Rating of Perceived Exertion (Exercise)  12     Perceived Dyspnea (Exercise)  0    Symptoms  None    Comments  Pt graduated CR program today.     Duration  Continue with 30 min of aerobic exercise without signs/symptoms of physical distress.    Intensity  THRR unchanged      Progression   Progression  Continue to progress workloads to maintain intensity without signs/symptoms of physical distress.    Average METs  2.9      Resistance Training   Training Prescription  No   Relaxation day, no weights.     Interval Training   Interval Training  No      Recumbant Bike   Level  3    Watts  23    Minutes  15    METs  2.3      NuStep   Level  4    SPM  85    Minutes  15    METs  3.4  Home Exercise Plan   Plans to continue exercise at  Home (comment)   Walking   Frequency  Add 4 additional days to program exercise sessions.    Initial Home Exercises Provided  06/07/18       Functional Capacity: 6 Minute Walk    Row Name 06/02/18 1013 11/09/18 1506       6 Minute Walk   Phase  Initial  Discharge    Distance  1634 feet  1613 feet    Distance % Change  -  -1.29 %    Distance Feet Change  -  -21 ft    Walk Time  6 minutes  6 minutes    # of Rest Breaks  0  0    MPH  3.1  3    METS  3.3  3.5    RPE  13  12    Perceived Dyspnea   0  0    VO2 Peak  11.64  12.56    Symptoms  No  No    Resting HR  84 bpm  75 bpm    Resting BP  110/60  116/62    Resting Oxygen Saturation   99 %  -    Exercise Oxygen Saturation  during 6 min walk  99 %  -    Max Ex. HR  101 bpm  114 bpm    Max Ex. BP  126/62  148/72    2 Minute Post BP  118/72  108/62       Psychological, QOL, Others - Outcomes: PHQ 2/9: Depression screen [facility] 2/9 11/09/2018 09/27/2018 03/29/2018 01/19/2018 02/27/2016  Decreased Interest 0 0 0 0 0  Down, Depressed, Hopeless 0 0 0 0 0  PHQ - 2 Score 0 0 0 0 0  Some recent data might be hidden    Quality of Life: Quality of Life - 11/09/18 1529      Quality of Life   Select  Quality of Life      Quality of  Life Scores   Health/Function Pre  18.2 %    Socioeconomic Pre  27.33 %    Psych/Spiritual Pre  24.75 %    Family Pre  24 %    GLOBAL Pre  22.05 %       Personal Goals: Goals established at orientation with interventions provided to work toward goal. Personal Goals and Risk Factors at Admission - 06/02/18 1019      Core Components/Risk Factors/Patient Goals on Admission    Weight Management  Weight Gain;Weight Maintenance    Improve shortness of breath with ADL's  Yes    Intervention  Provide education, individualized exercise plan and daily activity instruction to help decrease symptoms of SOB with activities of daily living.    Expected Outcomes  Short Term: Improve cardiorespiratory fitness to achieve a reduction of symptoms when performing ADLs;Long Term: Be able to perform more ADLs without symptoms or delay the onset of symptoms    Heart Failure  Yes    Intervention  Provide a combined exercise and nutrition program that is supplemented with education, support and counseling about heart failure. Directed toward relieving symptoms such as shortness of breath, decreased exercise tolerance, and extremity edema.    Expected Outcomes  Improve functional capacity of life;Short term: Attendance in program 2-3 days a week with increased exercise capacity. Reported lower sodium intake. Reported increased fruit and vegetable intake. Reports medication compliance.;Short term: Daily weights obtained and reported for increase.  Utilizing diuretic protocols set by physician.;Long term: Adoption of self-care skills and reduction of barriers for early signs and symptoms recognition and intervention leading to self-care maintenance.    Lipids  Yes    Intervention  Provide education and support for participant on nutrition & aerobic/resistive exercise along with prescribed medications to achieve LDL <33m, HDL >461m    Expected Outcomes  Short Term: Participant states understanding of desired cholesterol  values and is compliant with medications prescribed. Participant is following exercise prescription and nutrition guidelines.;Long Term: Cholesterol controlled with medications as prescribed, with individualized exercise RX and with personalized nutrition plan. Value goals: LDL < 7073mHDL > 40 mg.    Stress  Yes    Intervention  Offer individual and/or small group education and counseling on adjustment to heart disease, stress management and health-related lifestyle change. Teach and support self-help strategies.;Refer participants experiencing significant psychosocial distress to appropriate mental health specialists for further evaluation and treatment. When possible, include family members and significant others in education/counseling sessions.    Expected Outcomes  Short Term: Participant demonstrates changes in health-related behavior, relaxation and other stress management skills, ability to obtain effective social support, and compliance with psychotropic medications if prescribed.;Long Term: Emotional wellbeing is indicated by absence of clinically significant psychosocial distress or social isolation.        Personal Goals Discharge: Goals and Risk Factor Review    Row Name 10/13/18 1555             Core Components/Risk Factors/Patient Goals Review   Personal Goals Review  Improve shortness of breath with ADL's;Hypertension;Lipids;Stress       Review  CurTraevons been doing well with exercise since cardiac rehab restarted. [place] vital signs have been stable       Expected Outcomes  pt will participate in exercise, nutrition and lifestyle modification to decrease disease progress and improved quality of life          Exercise Goals and Review: Exercise Goals    Row Name 06/02/18 1015             Exercise Goals   Increase Physical Activity  Yes       Intervention  Provide advice, education, support and counseling about physical activity/exercise needs.;Develop an  individualized exercise prescription for aerobic and resistive training based on initial evaluation findings, risk stratification, comorbidities and participant's personal goals.       Expected Outcomes  Short Term: Attend rehab on a regular basis to increase amount of physical activity.;Long Term: Exercising regularly at least 3-5 days a week.;Long Term: Add in home exercise to make exercise part of routine and to increase amount of physical activity.       Able to understand and use rate of perceived exertion (RPE) scale  Yes       Intervention  Provide education and explanation on how to use RPE scale       Expected Outcomes  Short Term: Able to use RPE daily in rehab to express subjective intensity level;Long Term:  Able to use RPE to guide intensity level when exercising independently       Knowledge and understanding of Target Heart Rate Range (THRR)  Yes       Intervention  Provide education and explanation of THRR including how the numbers were predicted and where they are located for reference       Expected Outcomes  Long Term: Able to use THRR to govern intensity when exercising independently;Short Term: Able to  state/look up THRR;Short Term: Able to use daily as guideline for intensity in rehab       Able to check pulse independently  Yes       Intervention  Provide education and demonstration on how to check pulse in carotid and radial arteries.;Review the importance of being able to check your own pulse for safety during independent exercise       Expected Outcomes  Short Term: Able to explain why pulse checking is important during independent exercise;Long Term: Able to check pulse independently and accurately       Understanding of Exercise Prescription  Yes       Intervention  Provide education, explanation, and written materials on patient's individual exercise prescription       Expected Outcomes  Short Term: Able to explain program exercise prescription;Long Term: Able to explain  home exercise prescription to exercise independently          Exercise Goals Re-Evaluation: Exercise Goals Re-Evaluation    [place] Name 06/07/18 1525 10/12/18 1400 11/09/18 1517         Exercise Goal Re-Evaluation   Exercise Goals Review  Increase Physical Activity;Able to understand and use rate of perceived exertion (RPE) scale;Knowledge and understanding of Target Heart Rate Range (THRR);Understanding of Exercise Prescription;Increase Strength and Stamina  Increase Physical Activity;Able to understand and use rate of perceived exertion (RPE) scale;Knowledge and understanding of Target Heart Rate Range (THRR);Understanding of Exercise Prescription;Increase Strength and Stamina  Increase Physical Activity;Understanding of Exercise Prescription;Increase Strength and Stamina;Knowledge and understanding of Target Heart Rate Range (THRR);Able to understand and use rate of perceived exertion (RPE) scale;Able to check pulse independently     Comments  Reviewed HEP with Pt. Pt was responsive and understands THRR, RPE scale, weather precautions, end points of exericse, and NTG use. Pt is walking at home 30-45 minutes 5-7 days a week in addition to Cardiac Rehab.   Patient is walking at least 20-30 minutes 5-7 days/week. Pt also has 10lb weights at home that he uses for his resistance training.  Pt graduated CR program today. Pt tolerated exercise prescritptions well and increased resistance on all exercises gradually through program. Pt is walking 30 minutes poer day weather permitting and plans to continue to do so. Pt understands goals going forward with graduating.     Expected Outcomes  Will continue to monitor and progress Pt as tolerated.   Increase workloads as tolerated to help increase strength and stamina.  Pt will continue exercising at home after graduating.        Nutrition & Weight - Outcomes: Pre Biometrics - 06/02/18 1014      Pre Biometrics   Height  _0  (1.753 m)    Weight  68.1 kg     Waist Circumference  34.5 inches    Hip Circumference  37.5 inches    Waist to Hip Ratio  0.92 %    BMI (Calculated)  22.16    Triceps Skinfold  18 mm    % Body Fat  24 %    Grip Strength  34 kg    Flexibility  0 in    Single Leg Stand  1.85 seconds      Post Biometrics - 11/09/18 1509       Post  Biometrics   Height  _1  (1.753 m)    Weight  68.4 kg    Waist Circumference  32 inches    Hip Circumference  37.5 inches    Waist to  Hip Ratio  0.85 %    BMI (Calculated)  22.26    Triceps Skinfold  18 mm    % Body Fat  22.9 %    Grip Strength  40 kg    Flexibility  0 in    Single Leg Stand  3.18 seconds       Nutrition: Nutrition Therapy & Goals - 06/02/18 1114      Nutrition Therapy   Diet  heart healthy      Personal Nutrition Goals   Nutrition Goal  Pt to identify and limit food sources of sodium, saturated fat, trans fat, and refined carbohydrates      Intervention Plan   Intervention  Prescribe, educate and counsel regarding individualized specific dietary modifications aiming towards targeted core components such as weight, hypertension, lipid management, diabetes, heart failure and other comorbidities.    Expected Outcomes  Short Term Goal: Understand basic principles of dietary content, such as calories, fat, sodium, cholesterol and nutrients.;Long Term Goal: Adherence to prescribed nutrition plan.       Nutrition Discharge: Nutrition Assessments - 06/02/18 1114      MEDFICTS Scores   Pre Score  15       Education Questionnaire Score: Knowledge Questionnaire Score - 11/09/18 1453      Knowledge Questionnaire Score   Pre Score  21/24    Post Score  23/24       Goals reviewed with patient; copy given to patient.Pt graduated on 11/09/18 cardiac rehab program today with completion of 15 exercise sessions in Phase II. Pt maintained good attendance and progressed nicely during his participation in rehab as evidenced by increased MET level.   Medication  list reconciled. Repeat  PHQ score- 0 .  Pt has made significant lifestyle changes and should be commended for his success. Pt feels he has achieved his goals during cardiac rehab.   Pt plans to continue exercise by walking, weather permitting.. [name] maintained his current weight and completed his post exercise walk test. Barnet Pall, RN,BSN 11/10/2018 3:24 PM

## 2018-11-10 MED FILL — TORSEMIDE 20 MG TABLET: 20 | 77 days supply | Qty: 270 | Fill #2

## 2018-11-11 ENCOUNTER — Encounter (HOSPITAL_COMMUNITY): Payer: Medicare Other

## 2018-11-14 ENCOUNTER — Encounter (HOSPITAL_COMMUNITY): Payer: Medicare Other

## 2018-11-14 ENCOUNTER — Ambulatory Visit (HOSPITAL_COMMUNITY): Payer: Medicare Other

## 2018-11-17 ENCOUNTER — Encounter (HOSPITAL_COMMUNITY): Payer: Medicare Other | Admitting: Cardiology

## 2018-12-02 MED FILL — EPLERENONE 25 MG TABLET: 25 | 30 days supply | Qty: 30 | Fill #8

## 2018-12-13 ENCOUNTER — Ambulatory Visit (INDEPENDENT_AMBULATORY_CARE_PROVIDER_SITE_OTHER): Payer: Medicare Other

## 2018-12-13 ENCOUNTER — Other Ambulatory Visit: Payer: Self-pay

## 2018-12-13 ENCOUNTER — Encounter: Payer: Self-pay | Admitting: Family Medicine

## 2018-12-13 DIAGNOSIS — Z23 Encounter for immunization: Secondary | ICD-10-CM | POA: Diagnosis not present

## 2018-12-16 ENCOUNTER — Ambulatory Visit (HOSPITAL_COMMUNITY)
Admission: RE | Admit: 2018-12-16 | Discharge: 2018-12-16 | Disposition: A | Discharge status: Home / Self Care | Payer: Medicare Other | Source: Ambulatory Visit | Attending: Cardiology | Admitting: Cardiology

## 2018-12-16 ENCOUNTER — Other Ambulatory Visit: Payer: Self-pay

## 2018-12-16 ENCOUNTER — Encounter (HOSPITAL_COMMUNITY): Payer: Self-pay | Admitting: Cardiology

## 2018-12-16 VITALS — BP 126/59 | HR 80 | Wt 152.6 lb

## 2018-12-16 DIAGNOSIS — I5022 Chronic systolic (congestive) heart failure: Secondary | ICD-10-CM | POA: Insufficient documentation

## 2018-12-16 DIAGNOSIS — R9431 Abnormal electrocardiogram [ECG] [EKG]: Secondary | ICD-10-CM | POA: Diagnosis not present

## 2018-12-16 DIAGNOSIS — I272 Pulmonary hypertension, unspecified: Secondary | ICD-10-CM

## 2018-12-16 DIAGNOSIS — R0609 Other forms of dyspnea: Secondary | ICD-10-CM | POA: Diagnosis not present

## 2018-12-16 DIAGNOSIS — Z79899 Other long term (current) drug therapy: Secondary | ICD-10-CM | POA: Insufficient documentation

## 2018-12-16 DIAGNOSIS — G4733 Obstructive sleep apnea (adult) (pediatric): Secondary | ICD-10-CM | POA: Insufficient documentation

## 2018-12-16 DIAGNOSIS — I255 Ischemic cardiomyopathy: Secondary | ICD-10-CM | POA: Diagnosis not present

## 2018-12-16 DIAGNOSIS — I110 Hypertensive heart disease with heart failure: Secondary | ICD-10-CM | POA: Diagnosis not present

## 2018-12-16 DIAGNOSIS — I2510 Atherosclerotic heart disease of native coronary artery without angina pectoris: Secondary | ICD-10-CM | POA: Insufficient documentation

## 2018-12-16 DIAGNOSIS — I2721 Secondary pulmonary arterial hypertension: Secondary | ICD-10-CM | POA: Diagnosis not present

## 2018-12-16 DIAGNOSIS — I491 Atrial premature depolarization: Secondary | ICD-10-CM | POA: Diagnosis not present

## 2018-12-16 DIAGNOSIS — Z7982 Long term (current) use of aspirin: Secondary | ICD-10-CM | POA: Insufficient documentation

## 2018-12-16 DIAGNOSIS — E785 Hyperlipidemia, unspecified: Secondary | ICD-10-CM | POA: Diagnosis not present

## 2018-12-16 DIAGNOSIS — I454 Nonspecific intraventricular block: Secondary | ICD-10-CM | POA: Insufficient documentation

## 2018-12-16 DIAGNOSIS — I252 Old myocardial infarction: Secondary | ICD-10-CM | POA: Insufficient documentation

## 2018-12-16 DIAGNOSIS — I480 Paroxysmal atrial fibrillation: Secondary | ICD-10-CM | POA: Diagnosis not present

## 2018-12-16 DIAGNOSIS — I484 Atypical atrial flutter: Secondary | ICD-10-CM | POA: Insufficient documentation

## 2018-12-16 DIAGNOSIS — Z8673 Personal history of transient ischemic attack (TIA), and cerebral infarction without residual deficits: Secondary | ICD-10-CM | POA: Insufficient documentation

## 2018-12-16 DIAGNOSIS — I498 Other specified cardiac arrhythmias: Secondary | ICD-10-CM | POA: Diagnosis not present

## 2018-12-16 DIAGNOSIS — I080 Rheumatic disorders of both mitral and aortic valves: Secondary | ICD-10-CM | POA: Insufficient documentation

## 2018-12-16 DIAGNOSIS — Z951 Presence of aortocoronary bypass graft: Secondary | ICD-10-CM | POA: Insufficient documentation

## 2018-12-16 DIAGNOSIS — Z9889 Other specified postprocedural states: Secondary | ICD-10-CM

## 2018-12-16 DIAGNOSIS — I6523 Occlusion and stenosis of bilateral carotid arteries: Secondary | ICD-10-CM | POA: Diagnosis not present

## 2018-12-16 DIAGNOSIS — Z8249 Family history of ischemic heart disease and other diseases of the circulatory system: Secondary | ICD-10-CM | POA: Diagnosis not present

## 2018-12-16 LAB — BASIC METABOLIC PANEL
Anion gap: 13 (ref 5–15)
BUN: 19 mg/dL (ref 8–23)
CO2: 26 mmol/L (ref 22–32)
Calcium: 9.2 mg/dL (ref 8.9–10.3)
Chloride: 98 mmol/L (ref 98–111)
Creatinine, Ser: 1.15 mg/dL (ref 0.61–1.24)
GFR calc Af Amer: >60 mL/min (ref >60)
GFR calc non Af Amer: 60 mL/min — ABNORMAL LOW (ref >60)
Glucose, Bld: 103 mg/dL — ABNORMAL HIGH (ref 70–99)
Potassium: 3.2 mmol/L — ABNORMAL LOW (ref 3.5–5.1)
Sodium: 137 mmol/L (ref 135–145)

## 2018-12-16 LAB — BRAIN NATRIURETIC PEPTIDE: B Natriuretic Peptide: 578.8 pg/mL — ABNORMAL HIGH (ref 0.0–100.0)

## 2018-12-16 MED ORDER — DIGOXIN 125 MCG PO TABS: ORAL_TABLET | ORAL | 1 refills | Status: DC

## 2018-12-16 MED ORDER — TADALAFIL 2.5 MG PO TABS: 2.5000 mg | ORAL_TABLET | Freq: Every day | ORAL | 2 refills | Status: DC

## 2018-12-16 MED ORDER — TORSEMIDE 20 MG PO TABS: 60.0000 mg | ORAL_TABLET | Freq: Every day | ORAL | 6 refills | Status: DC

## 2018-12-16 MED FILL — TADALAFIL 2.5 MG TABS: 2.5 | 60 days supply | Qty: 60 | Fill #0

## 2018-12-16 MED FILL — DIGOXIN 0.125 MG TABLET: 125 | 90 days supply | Qty: 45 | Fill #0

## 2018-12-16 NOTE — Patient Instructions (Addendum)
Labs done today. We will contact you only if your labs are abnormal.  START Tadalafil 2.56m(1 tab) by mouth once daily  START Digoxin 0.06218m1/2 tab) by mouth once daily  INCREASE Torsemide to 6010maily alternating with 36m45mily.  You have been referred to Pulmonary Rehab. They will contact you to schedule an appointment.  Your physician recommends that you schedule a follow-up appointment in: 10 days for a lab only appointment and in 1 month for a follow up appointment with Dr. McleAundra Dubinour physician has recommended that you have a cardiopulmonary stress test (CPX). CPX testing is a non-invasive measurement of heart and lung function. It replaces a traditional treadmill stress test. This type of test provides a tremendous amount of information that relates not only to your present condition but also for future outcomes. This test combines measurements of you ventilation, respiratory gas exchange in the lungs, electrocardiogram (EKG), blood pressure and physical response before, during, and following an exercise protocol. Someone will contact you about an appointment.   Your physician has requested that you have a carotid duplex. This test is an ultrasound of the carotid arteries in your neck. It looks at blood flow through these arteries that supply the brain with blood. Allow one hour for this exam. There are no restrictions or special instructions. [facility]. They will contact you to schedule this appointment.   At the [facility] and your health needs are our priority. As part of our continuing mission to provide you with exceptional heart care, we have created designated Provider Care Teams. These Care Teams include your primary Cardiologist (physician) and Advanced Practice Providers (APPs- Physician Assistants and Nurse Practitioners) who all work together to provide you with the care you need, when you need it.   You may see  any of the following providers on your designated Care Team at your next follow up: . DrMarland KitchenDaniGlori Bickersr DaltLoralie Champagnemy Darrick Grinder   Please be sure to bring in all your medications bottles to every appointment.

## 2018-12-16 NOTE — Progress Notes (Signed)
6 minute walk: Pt walked 487 meters. Tolerated well. HR range 91-110, O2 range 94-97%. No complaints

## 2018-12-18 NOTE — Progress Notes (Signed)
Patient ID: Caleb Moment, PhD, male   DOB: 1940-10-13, 78 y.o.   MRN: [id number]    Advanced Heart Failure Clinic Note   PCP: [name] EP: [name] Cardiology: [name]  78 y.o.with complex past history presents for heart failure followup.  Patient had anterior MI in 2004 and developed ischemic cardiomyopathy as well as mitral regurgitation. He also developed atrial fibrillation.  In 10/14, he had MV repair, Maze, CABG with LIMA-LAD, and LA appendage closure at [facility] in [place].  Subsequently, atrial fibrillation returned and he had an atrial fibrillation ablation in [facility] in 3/15.  He wore a Zio patch in 12/15 and had a low atrial fibrillation burden of 7%.  He has had a long-standing ischemic cardiomyopathy.  In 2015, EF was 20-25%.  Echo in 2016 also showed EF 25-30% but estimated PA pressure suggested severe pulmonary hypertension.     At initial appointment, he reported increased exertional dyspnea over a number of weeks.  RHC was done, showing mildly elevated PCWP with moderate pulmonary HTN and low cardiac index (1.93 thermo, 2.13 Fick).  V/Q scan showed no PE.  I started him on digoxin and have titrated his Lasix to 80 mg daily.  He is now on bisoprolol and able to tolerate it.  At last appointment, I had him try replacing valsartan with Entresto 24/26 bid.  He was unable to tolerate it (made him dizzy, though BP was not low).  Therefore, I had him restart valsartan.  CPX in 4/16 showed mildly decreased functional capacity.  He has tolerated Adcirca 20 mg daily.  He did not tolerate an attempt to uptitrate bisoprolol.  He tried CPAP but was unable to tolerate it.  He was unable to tolerate eplerenone 50 mg daily.  Last echo in 9/17 showed EF 20-25%, stable MV repair, normal RV, and PA systolic pressure 86 mmHg.   He had a Lexiscan Cardiolite in 9/17 that showed infarction, no ischemia.  [place]ae in 10/17 showed severe mixed pulmonary venous HTN/pulmonary arterial HTN with PVR 4.3  WU and PCWP mildly elevated.  Adcirca was increased to 40 mg daily.   At a prior appointment, he was having more palpitations.  Event monitor in 11/17 showed atrial fibrillation episodes, these were symptomatic.  Since then, the atrial fibrillation seems to have decreased.  He saw [name] to discuss Tikosyn initiation.  No decision was reached at that time, and since palpitations have decreased again, he wants to hold off on Tikosyn for now.   He has OSA.  He cannot tolerate CPAP but is using his oral appliance.    HR was lower in 5/18.  He came into the office with HR around 40.  ECG showed an ectopic atrial rhythm with very small P waves. I stopped bisoprolol and later digoxin.  HR has gone up since.  He wore an event monitor in 6/18 showing rare 3-5 second pauses at night only, rare atrial fibrillation, and rare junctional bradycardia.  He wore an event monitor again in 4/19, this showed rare afib (1% total) with nocturnal bradycardia and 1 nocturnal 3 second pause.  No concerning findings.   He stopped Adcirca as his insurance was no longer covering it.    He saw [name], and it was decided that he would be unlikely to improve much with CRT with his current IVCD (not true LBBB).   Echo (7/19) is comparable to the past with mildly dilated LV, EF 25% with wall motion abnormalities,  mild RV dilation/mild decreased function, mild to moderate AI, stable repaired mitral valve, PASP 76 mmHg.     Given worsening symptoms, he had RHC in 8/19.  This showed evidence for volume overload with severe pulmonary arterial hypertension, PVR 5.8 WU and preserved cardiac output. Torsemide was increased.    He was started on Opsumit.  He says that it helped his breathing but he was unable to continue it due to considerable worsening of his peripheral edema.  He has stopped Opsumit.    After developing increased atrial fibrillation burden, I put him back on bisoprolol 2.5 mg daily.  He developed bradycardia and  presyncope, so this was stopped.  Lightheadedness resolved.  Zio patch in 6/20 off bisoprolol showed average HR 68, short runs of SVT (possible atrial fibrillation) and only nocturnal bradycardia.    Echo was done in 7/20 and reviewed, EF 15%, moderate-severe LV dilation, mild RV dilation with decreased systolic function, mild-moderate MR s/p MV repair, mild-moderate AI, PASP 40 mmHg.   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  However, his 6 minute walk remains quite good.  He has more generalized fatigue though for the most part he is able to walking on flat ground without dyspnea.  He has days when he feels "really bad."  No orthopnea/PND.  Weight is stable.  He is taking multiple new herbals now and wants to start back on tadalafil but at 2.5 mg daily (as part of an alternative regimen).   6 minute walk (3/16): 381 m  6 minute walk (5/16): 414.5 m 6 minute walk (10/16): 562 m 6 minute walk (1/17): 469 m 6 minute walk (8/17): 488 m 6 minute walk (6/18): 549 m 6 minute walk (11/18): 366 m 6 minute walk (9/19): 518 m 6 minute walk (9/20): 487 m  ECG (personally reviewed): atypical flutter with 2:1 block, rate 81, QRS 152 msec  Labs (2/15): LDL 144 Labs (8/15): K 4.6, creatinine 0.9 Labs (12/15): HCT 42.3   Labs (2/16): K 4 => 4.2, creatinine 1.05 => 0.92, BNP 268 Labs (3/16): BNP 495 => 320, digoxin 0.4, RF 14.7 (very mild increase), TSH normal, HIV negative, anti-SCL70 negative, creatinine 0.91, K 4.4 Labs (7/16): K 5, creatinine 1.05, BNP 455, vitamin D normal, digoxin 0.4, B12 normal Labs (8/16): digoxin 0.8, BNP 305, K 4.2, creatinine 0.99 Labs (9/16): HCT 43.3, TSH normal, BNP 492 => 278, K 4.3, creatinine 0.97 => 1.04, digoxin 0.6, TSH normal, LDL 154, LDL-P 1654.  Labs (10/16): K 4.7, creatinine 1.1 Labs (1/17): pro-BNP 2065, digoxin 0.3, K 4.3, creatinine 1.10 => 1.28 Labs (3/17): K 4.1, creatinine 1.09, HCT 38.3 Labs (4/17): K 4.4, creatinine 0.99, digoxin  0.8 Labs (5/17): K 4.3, creatinine 1.08, BNP 317, hgb 13.1 Labs (8/17): K 4.6, creatinine 0.99, BNP 392, digoxin 0.5 Labs (9/17): K 4.4, creatinine 1.05 Labs (10/17): digoxin 0.5, K 4.1, creatinine 1.12, HCT 44.9, digoxin 0.5 Labs (11/17): K 4 => 3.8, creatinine 1.3 => 1.16, digoxin 0.5, BNP 296 Labs (12/17): K 4.1, creatinine 1.15, BNP 294, digoxin 0.7 Labs (2/18): LDL 135, Lp(a) 124, LDL-P 979  Labs (5/18): K 4.3, creatinine 1.07 => 0.95, BNP 224, hgb 14.4, digoxin 0.6 Labs (6/18): K 4.4, creatinine 1.08, hgb 14 Labs (8/18): K 4.1, creatinine 1.05 Labs (12/18): K 3.7, creatinine 0.94 Labs (2/19): K 4.1, creatinine 0.99, BNP 255 Labs (5/19): K 4, creatinine 0.98, LDL 140 Labs (7/19): LDL 136 Labs (9/19): K 4.1, creatinine 0.91 Labs (12/19): K 3.6, creatinine  1.18, BNP 479, hgb 12.9 Labs (3/20): K 3.4, creatinine 1.14 Labs (5/20): K 4.1, creatinine 0.96 Labs (7/20): K 4, creatinine 1.05, BNP 407  PMH: 1. CAD: Anterior MI in 2004.  Cardiac surgery in 10/14 included LIMA-LAD.  - Lexiscan Cardiolite (9/17): EF 35%, infarct present with no ischemia.  2. Chronic mitral regurgitation: 10/14 surgery at [facility] with MV repair, Maze, LIMA-LAD, and LA appendage closure.  3. Atrial fibrillation: Paroxysmal.  He was initially on Tikosyn but had breakthrough atrial fibrillation.  H/o Maze in 2014.  Had recurrent atrial fibrillation with ablation in 3/15 by Dr Ola Spurr in [facility].  Not anticoagulated after 2 severe prostate bleeding episodes.  LA appendage was oversewn with MV surgery.  - Zio patch in 12/15 with low atrial fibrillation burden (7%).  - Holter (9/16) with PACs, PVCs, short atrial fibrillation runs (nothing sustained). - Event monitor (11/17) with runs of atrial fibrillation and flutter.   - Event monitor (6/18) with rare atrial fibrillation - Event monitor 4/19 showing rare afib (1% total) with nocturnal bradycardia and 1 nocturnal 3 second pause.  No concerning findings.  -  Zio patch (6/20): Primarily NSR, average HR 68, short SVT runs (possible atrial fibrillation), occasional nocturnal bradycardia (nothing worrisome).  4. Chronotropic incompetence.  5. HTN 6. Hyperlipidemia: Refuses statin.  7. Peripheral neuropathy 8. GERD 9. H/o BPPV 10. H/o TIA 11. Ischemic cardiomyopathy: cardiac MRI 7/14 with EF 35%, moderate MR, normal RV size and systolic function, extensive anterior and anteroseptal LGE suggestive of non-viable myocardium (this was prior to LIMA-LAD).  Echo 1/15 with EF 20-25%, moderate AI, PA systolic pressure 39 mmHg. Echo (1/16) with EF 20-25%, diffuse hypokinesis with regionality, moderate LV dilation, moderate AI, s/p MV repair with mild MR and normal gradients, RV dilated with mildly decreased systolic function, PA systolic pressure 71 mmHg.   - RHC (2/16) with mean RA 9, PA 61/25 mean 40, mean PCWP 23, CI 2.13/PVR 4.3 (Fick), CI 1.93/PVR 4.7 (thermo).   - CPX (4/16) with peak VO2 17.9, VE/VCO2 34.7 => mildly decreased functional capacity.  - Spironolactone apparently caused cognitive deficits - Intolerant of Coreg due to development of severe alopecia - Unable to uptitrate bisoprolol due to intolerance.  - Lightheaded with Entresto.  - Unable to tolerate increase in valsartan to 80 mg bid.  - Echo (1/17) with EF 30-35%, moderate LV dilation, mild AI, s/p MV repair with mild MR, moderately dilated RV with mildly decreased systolic function, PA systolic pressure 69 mmHg.  - CPX (4/17): peak VO2 18, VE/VCO2 slope 33, RER 1.28 => mild to moderate functional impairment, mildly improved.  - Echo (9/17): EF 20-25% with regional WMAs, normal RV size and systolic function, PASP 86 mmHg, stable repaired mitral valve with mild MR, moderate AI.  - RHC (10/17): mean RA 9, PA 70/23 mean 43, PCWP mean 20, CI 2.96 Fick/2.84 Thermo, PVR 4.3 WU.  - Echo (9/18): severe LV dilation, EF 20-25% with WMAs, s/p MV repair with mild MR, moderate AI, moderate TR, PASP 68  mmHg, normal RV size and systolic function.  - Echo (7/19): mildly dilated LV, EF 25% with wall motion abnormalities, mild RV dilation/mild decreased function, mild to moderate AI, stable repaired mitral valve (no MS, mild MR), PASP 76 mmHg.   - CPX (8/19): peak VO2 18.9, VE/VCO2 slope 40, RER 1.09 => moderate HF limitation.  - RHC (8/19): mean RA 14, PA 75/28 mean 47, mean PCWP 24, CI 2.44/PVR 5 Fick, CI 2.13/PVR 5.8 thermo.  -  Echo (7/20): EF 15%, moderate-severe LV dilation, mild RV dilation with decreased systolic function, mild-moderate MR s/p MV repair, mild-moderate AI, PASP 40 mmHg.  12. Carotid stenosis: Carotid dopplers (1/16) with 40-59% bilateral ICA stenosis. Carotid dopplers (4/17) with 40-59% BICA stenosis.  - Carotid dopplers (2/18) with < 50% BICA stenosis.  13. Aortic insufficiency: Moderate by last echo 9/17.  14. Pulmonary HTN: Mixed PAH and pulmonary venous hypertension.  PFTs (9/14) were normal.  V/Q scan (2/16) with no evidence of acute or chronic PE.   - Unable to tolerate Opsumit due to extensive peripheral edema.  15. OSA: Moderate on 5/16 sleep study. Unable to tolerate CPAP.  Repeat sleep study at [facility] in 2/18 was also suggestive of OSA.  16. Venous insufficiency 17. Ventral hernia 18. Bradycardia: Holter (5/18) with overnight pauses up to 4.7 sec (likely due to OSA), occasional short atrial tachycardia runs, no atrial fibrillation, avg HR 70s, 3% PVCs.  - Event monitor (6/18): Rare 3-4 second pauses at night, rare atrial fibrillation, rare runs of junctional bradycardia.   SH: Married, lives in [place], Engineer, water, nonsmoker  FH: CAD  ROS: All systems reviewed and negative except as per HPI.   Current Outpatient Medications  Medication Sig Dispense Refill   aspirin EC 81 MG tablet Take 1 tablet (81 mg total) by mouth daily. 30 tablet 3   eplerenone (INSPRA) 25 MG tablet TAKE 1 TABLET BY MOUTH ONCE DAILY 30 tablet 11   magnesium gluconate (MAGONATE)  500 MG tablet Take 500 mg by mouth 2 (two) times daily.      Omega-3 Fatty Acids (FISH OIL PO) Take 360 mg by mouth daily.      potassium chloride (K-DUR,KLOR-CON) 20 MEQ tablet Take 2.5 tablets (50 mEq total) by mouth daily. 225 tablet 3   torsemide (DEMADEX) 20 MG tablet Take 3 tablets (60 mg total) by mouth daily. Alternate 65m (3 tabs) and 849m(4 tabs) every other day 270 tablet 6   valsartan (DIOVAN) 40 MG tablet Take 4061mn the morning and 46m108m the evening 270 tablet 3   digoxin (LANOXIN) 0.125 MG tablet Take a 1/2 tablet by mouth once daily. 45 tablet 1   Tadalafil 2.5 MG TABS Take 1 tablet (2.5 mg total) by mouth daily. 60 tablet 2   No current facility-administered medications for this encounter.    BP (!) 126/59    Pulse 80    Wt 69.2 kg (152 lb 9.6 oz)    SpO2 98%    BMI 22.54 kg/m    Wt Readings from Last 3 Encounters:  12/16/18 69.2 kg (152 lb 9.6 oz)  11/09/18 68.4 kg (150 lb 12.7 oz)  10/18/18 68.5 kg (151 lb)    General: NAD Neck: JVP 8-9 cm, no thyromegaly or thyroid nodule.  Lungs: Clear to auscultation bilaterally with normal respiratory effort. CV: Nondisplaced PMI.  Heart regular S1/S2, no S3/S4, 2/6 HSM LLSB.  2+ edema 1/2 to knees bilaterally.  No carotid bruit.  Normal pedal pulses.  Abdomen: Soft, nontender, no hepatosplenomegaly, no distention.  Skin: Intact without lesions or rashes.  Neurologic: Alert and oriented x 3.  Psych: Normal affect. Extremities: No clubbing or cyanosis.  HEENT: Normal.   Assessment/Plan: 1. CAD: S/p LIMA-LAD.  He has decided not to take statins after reviewing the data (I did recommend taking a statin but we have agreed to disagree on this, he is taking red yeast rice extract).  Lexiscan Cardiolite was done in 9/17 due to atypical  chest pain.  This showed prior infarction with no ischemia.  No recurrence of chest pain since that time despite ongoing exercise.   - Continue ASA 81 daily.  2. S/p mitral valve repair: The  MV repair looked stable on 7/20 echo with no MS, mild-moderate MR.  3. Aortic insufficiency: Echo in 9/20 with mild-moderate AI, stable.  4. Chronic systolic CHF: Ischemic cardiomyopathy, EF 15% with mildly decreased RV systolic function on 1/02 echo.  Most recent CPX in 8/19 with moderate HF limitation, mildly worse than prior.  He was volume overloaded on 8/19 RHC with severe pulmonary hypertension but preserved cardiac output.  NYHA class II-III symptoms per report though very good 6 minute walk today.  Prominent fatigue.  He also looks mildly volume overloaded on exam.   - Increase torsemide to 80 mg daily alternating with 60 mg daily.  BMET today and again in 10 days.   - Bisoprolol and digoxin were both stopped with bradycardia and lightheadedness.  With prominent fatigue, I am going to try him back on digoxin at low dose, 0.0625 mg daily.    - He did not tolerate Entresto.  Continue valsartan 40 qam/80 qpm, he has not tolerated uptitration.   - Continue eplerenone 25 (unable to tolerate increase).        - He would be unlikely to benefit much from CRT, have reviewed with [name] (has IVCD, not LBBB).   - He has completed cardiac rehab.  Given ongoing fatigue/dyspnea, I will see if I can get him in pulmonary rehab.  - I will arrange for CPX, can compare to last year.  - We discussed RHC to assess filling pressure and cardiac output.  I am concerned that he may have developed low output.  He is 78 but in good health and active with no major issues other than his heart problems.  LVAD would not be out of the question, but age and prior sternotomy make it higher risk.  He says that he would not be interested in another heart surgery so will hold off on RHC for now.  5. Atrial fibrillation and flutter: s/p Maze in 10/14, then ablation in 3/15.  Prior to Maze, he was on Tikosyn but had breakthrough.  Currently, he is not anticoagulated due to history of prostate bleeding and his choice.  He did have  his LA appendage oversewn at time of MV surgery.   1% atrial fibrillation on 4/19 event monitor.  Zio patch in 6/20 with short SVT runs (possible atrial fibrillation).  Today, he is in atrial flutter with 2:1 block, HR 81.  He does not feel palpitations.  - He is off bisoprolol with bradycardia.    - We have had discussions about what to do with his atrial fibrillation/flutter.  He is symptomatic at times when in atrial fibrillation/fluttter. He had breakthrough on Tikosyn in the past, but has had Maze and atrial fibrillation ablation since that time. He opted to hold off on trying Tikosyn again and currently feels like the atrial fibrillation/flutter burden is manageable.  - Atypical flutter today but not a candidate for cardioversion as he has refused anticoagulation.  Rate is not elevated.  6. Pulmonary hypertension: Severe by echo in 9/17 and on RHC in 10/17.  RV actually looked ok on 9/17 echo.  Mixed pulmonary venous and pulmonary arterial HTN on RHC.  It is possible that the [facility] component is due to pulmonary vascular remodeling in the setting of chronic mitral regurgitation prior  to MV repair. Negative V/Q scan, no evidence for CTEPH.  PFTs normal in 9/14.  He is seeing Dr Lake Bells.  Sleep study showed moderate OSA but he has been unable to tolerate CPAP and is now using an oral device. PASP 76 mmHg by echo in 7/19.  He tried Engineer, site but stopped it so that he could take a nitric oxide supplement.  I repeated RHC in 8/19.  This showed severe mixed pulmonary venous/pulmonary arterial HTN with PVR 5.8 WU and preserved cardiac output. He was started on Opsumit but did not tolerate due to extensive peripheral edema.  - Breathing improved with Opsumit, but he was unable to tolerate due to severe peripheral edema.     - He wants to go back on tadalafil, but take 2.5 mg daily instead of 20 mg daily (based on an alternative herbal regimen he is interested in following). I told him that I would rather have him on  tadalafil 20 mg daily but as he does not want to do this, I will prescribe him with tadalafil 2.5 mg daily.  - We have discussed a trial of selexipag.  He will consider it in the future.   7. OSA: Moderate.  Has not tolerated CPAP. Probably plays a role in recurrent atrial fibrillation and pulmonary hypertension as well as nocturnal sinus pauses.  He has been using his oral device. 8. Carotid stenosis: 2/18 carotid dopplers at [facility] with < 50% bilateral stenosis.   - Arrange for carotid dopplers.  9. Bradycardia: Nocturnal bradycardia in the past likely related to OSA.  Developed more ongoing bradycardia and bisoprolol and digoxin stopped.  Restarted later on bisoprolol with afib/mild RVR but became bradycardic with pre-syncope and bisoprolol stopped. I think that he has sick sinus syndrome.   I suspect he will eventually need a PPM, but currently, his HR is adequate off bisoprolol => HR 81 today with atypical atrial flutter and 2:1 AVB. - He will stay off beta blockers.  - As above, plan to start low dose digoxin and will follow heart rate closely.   Followup in 1 month.   Loralie Champagne 12/18/2018

## 2018-12-19 ENCOUNTER — Telehealth (HOSPITAL_COMMUNITY): Payer: Self-pay

## 2018-12-19 ENCOUNTER — Other Ambulatory Visit (HOSPITAL_COMMUNITY): Payer: Self-pay

## 2018-12-19 ENCOUNTER — Encounter (HOSPITAL_COMMUNITY): Payer: Self-pay | Admitting: *Deleted

## 2018-12-19 ENCOUNTER — Encounter (HOSPITAL_COMMUNITY): Payer: Self-pay

## 2018-12-19 MED ORDER — TADALAFIL 2.5 MG PO TABS: 2.5000 mg | ORAL_TABLET | Freq: Every day | ORAL | 2 refills | Status: DC

## 2018-12-19 NOTE — Progress Notes (Signed)
Received referral from Dr. Rodena Medin this pt to participate in Pulmonary Rehab with the diagnosis of Pulmonary Hypertension.  This pt is well known to our cardiac rehab staff.  Pt completed cardiac rehab in August and has participated in CR in 2019 with the diagnosis of Systolic CHF. Clinical review of pt follow up appt on 12/16/18 Heart failure clinic office note.   Pt appropriate for scheduling for Pulmonary rehab.  Will forward to support staff for scheduling and verification of insurance eligibility/benefits with pt  consent. Cherre Huger, BSN Cardiac and Training and development officer

## 2018-12-19 NOTE — Telephone Encounter (Signed)
Pt insurance is active and benefits verified through Medicare A/B. Co-pay $0.00, DED $198.00/$198.00 met, out of pocket $0.00/$0.00 met, co-insurance 20%. No pre-authorization required. 12/19/2018 _0   2ndary insurance is active and benefits verified through [facility]. Co-pay $0.00, DED $0.00/$0.00 met, out of pocket $0.00/$0.00 met, co-insurance 0%. No pre-authorization required.   Will pass pt referral to [facility] for scheduling.

## 2018-12-20 ENCOUNTER — Telehealth (HOSPITAL_COMMUNITY): Payer: Self-pay | Admitting: Pharmacy Technician

## 2018-12-20 NOTE — Telephone Encounter (Signed)
Patient Advocate Encounter   Received notification from [facility] that prior authorization for Tadalafil 2.5 is required.   PA submitted on CoverMyMeds Key  [id number] Status is pending   Will continue to follow.  Charlann Boxer, CPhT

## 2018-12-21 ENCOUNTER — Telehealth (HOSPITAL_COMMUNITY): Payer: Self-pay

## 2018-12-21 ENCOUNTER — Other Ambulatory Visit (HOSPITAL_COMMUNITY): Payer: Self-pay | Admitting: Cardiology

## 2018-12-21 DIAGNOSIS — I272 Pulmonary hypertension, unspecified: Secondary | ICD-10-CM

## 2018-12-21 DIAGNOSIS — I6523 Occlusion and stenosis of bilateral carotid arteries: Secondary | ICD-10-CM

## 2018-12-21 NOTE — Telephone Encounter (Signed)
They covered tadalafil 20 mg in the past, why will they not cover 2.5 mg?

## 2018-12-21 NOTE — Telephone Encounter (Signed)
Prior Authorization for Tadalafil 2.75m was denied.   Based off previous notes, routing message to provider to see if there is anything else he would like for [place] do concerning this.  ECharlann Boxer CPhT

## 2018-12-22 NOTE — Telephone Encounter (Signed)
The denial letter states that 2.5 Tadalafil is not FDA approved for PAH. The drug is denied because it is not being used for a "medically accepted indication."

## 2018-12-22 NOTE — Telephone Encounter (Signed)
Can we get him tadalafil 20 mg and have him cut them up?

## 2018-12-23 ENCOUNTER — Other Ambulatory Visit: Payer: Self-pay

## 2018-12-23 ENCOUNTER — Other Ambulatory Visit (HOSPITAL_COMMUNITY): Payer: Self-pay | Admitting: Cardiology

## 2018-12-23 ENCOUNTER — Ambulatory Visit (HOSPITAL_COMMUNITY)
Admission: RE | Admit: 2018-12-23 | Discharge: 2018-12-23 | Disposition: A | Discharge status: Home / Self Care | Payer: Medicare Other | Source: Ambulatory Visit | Attending: Cardiology | Admitting: Cardiology

## 2018-12-23 DIAGNOSIS — I6523 Occlusion and stenosis of bilateral carotid arteries: Secondary | ICD-10-CM

## 2018-12-23 DIAGNOSIS — I272 Pulmonary hypertension, unspecified: Secondary | ICD-10-CM

## 2018-12-26 ENCOUNTER — Other Ambulatory Visit: Payer: Self-pay

## 2018-12-26 ENCOUNTER — Ambulatory Visit (HOSPITAL_COMMUNITY)
Admission: RE | Admit: 2018-12-26 | Discharge: 2018-12-26 | Disposition: A | Discharge status: Home / Self Care | Payer: Medicare Other | Source: Ambulatory Visit | Attending: Cardiology | Admitting: Cardiology

## 2018-12-26 DIAGNOSIS — I5022 Chronic systolic (congestive) heart failure: Secondary | ICD-10-CM

## 2018-12-26 LAB — BASIC METABOLIC PANEL
Anion gap: 14 (ref 5–15)
BUN: 18 mg/dL (ref 8–23)
CO2: 25 mmol/L (ref 22–32)
Calcium: 9 mg/dL (ref 8.9–10.3)
Chloride: 96 mmol/L — ABNORMAL LOW (ref 98–111)
Creatinine, Ser: 1.08 mg/dL (ref 0.61–1.24)
GFR calc Af Amer: >60 mL/min (ref >60)
GFR calc non Af Amer: >60 mL/min (ref >60)
Glucose, Bld: 139 mg/dL — ABNORMAL HIGH (ref 70–99)
Potassium: 4.3 mmol/L (ref 3.5–5.1)
Sodium: 135 mmol/L (ref 135–145)

## 2018-12-27 ENCOUNTER — Encounter (HOSPITAL_COMMUNITY)
Admission: RE | Admit: 2018-12-27 | Discharge: 2018-12-27 | Disposition: A | Discharge status: Home / Self Care | Payer: Medicare Other | Source: Ambulatory Visit | Attending: Cardiology | Admitting: Cardiology

## 2018-12-27 VITALS — BP 126/60 | HR 88 | Ht 70.0 in | Wt 149.9 lb

## 2018-12-27 DIAGNOSIS — I5022 Chronic systolic (congestive) heart failure: Secondary | ICD-10-CM | POA: Diagnosis not present

## 2018-12-27 NOTE — Progress Notes (Signed)
Pulmonary Individual Treatment Plan  Patient Details  Name: Caleb TRAVELSTEAD, PhD MRN: [id number] Date of Birth: 08-22-1940 Referring Provider:     Pulmonary Rehab Walk Test from 12/27/2018 in [facility]  Referring Provider  [name]      Initial Encounter Date:    Pulmonary Rehab Walk Test from 12/27/2018 in [facility]  Date  12/27/18      Visit Diagnosis: Heart failure, chronic systolic ([place])  Patient's Home Medications on Admission:   Current Outpatient Medications:  .  aspirin EC 81 MG tablet, Take 1 tablet (81 mg total) by mouth daily., Disp: 30 tablet, Rfl: 3 .  digoxin (LANOXIN) 0.125 MG tablet, Take a 1/2 tablet by mouth once daily., Disp: 45 tablet, Rfl: 1 .  eplerenone (INSPRA) 25 MG tablet, TAKE 1 TABLET BY MOUTH ONCE DAILY, Disp: 30 tablet, Rfl: 11 .  magnesium gluconate (MAGONATE) 500 MG tablet, Take 500 mg by mouth 2 (two) times daily. , Disp: , Rfl:  .  Omega-3 Fatty Acids (FISH OIL PO), Take 360 mg by mouth daily. , Disp: , Rfl:  .  potassium chloride (K-DUR,KLOR-CON) 20 MEQ tablet, Take 2.5 tablets (50 mEq total) by mouth daily., Disp: 225 tablet, Rfl: 3 .  Tadalafil 2.5 MG TABS, Take 1 tablet (2.5 mg total) by mouth daily., Disp: 60 tablet, Rfl: 2 .  torsemide (DEMADEX) 20 MG tablet, Take 3 tablets (60 mg total) by mouth daily. Alternate 40m (3 tabs) and 848m(4 tabs) every other day, Disp: 270 tablet, Rfl: 6 .  valsartan (DIOVAN) 40 MG tablet, Take 4065mn the morning and 78m42m the evening, Disp: 270 tablet, Rfl: 3  Past Medical History: Past Medical History:  Diagnosis Date  . Allergic rhinitis   . Atrial fibrillation (HCC)[place]. Atypical pneumonia   . CAD (coronary artery disease)   . Cardiomyopathy, ischemic   . Chronic anticoagulation   . Cough   . Dizziness   . GERD (gastroesophageal reflux disease)   . Heart failure, systolic, acute on chronic (HCC)[place]. Hyperlipidemia   . Hypertension    . OSA (obstructive sleep apnea)    Home sleep test 07/05/2009 AHI 8.2  . Pleural effusion   . Positional vertigo   . TIA (transient ischemic attack)     Tobacco Use: Social History   Tobacco Use  Smoking Status Never Smoker  Smokeless Tobacco Never Used    Labs: Recent Review Flowsheet Data    Labs for ITP Cardiac and Pulmonary Rehab Latest Ref Rng & Units 01/10/2016 01/10/2016 08/17/2017 10/07/2017 11/16/2017   Cholestrol 0 - 200 mg/dL - - 212(H) 206(H) -   LDLCALC 0 - 99 mg/dL - - 140(H) 136(H) -   LDLDIRECT mg/dL - - - - -   HDL >40 mg/dL - - 55 53 -   Trlycerides <150 mg/dL - - 86 84 -   Hemoglobin A1c 4.8 - 5.6 % - - 5.5 - -   PHART 7.350 - 7.450 - - - - -   PCO2ART 35.0 - 45.0 mmHg - - - - -   HCO3 20.0 - 28.0 mmol/L 26.7 26.1 - - 24.8   TCO2 22 - 32 mmol/L 28 27 - - 26   ACIDBASEDEF 0.0 - 2.0 mmol/L - - - - -   O2SAT % 68.0 67.0 - - 69.0      Capillary Blood Glucose: No results found for: GLUCAP  Pulmonary Assessment Scores: Pulmonary Assessment Scores    Row Name 12/27/18 1544         ADL UCSD   ADL Phase  Entry     SOB Score total  40       CAT Score   CAT Score  15       mMRC Score   mMRC Score  3       UCSD: Self-administered rating of dyspnea associated with activities of daily living (ADLs) 6-point scale (0 = "not at all" to 5 = "maximal or unable to do because of breathlessness")  Scoring Scores range from 0 to 120.  Minimally important difference is 5 units  CAT: CAT can identify the health impairment of COPD patients and is better correlated with disease progression.  CAT has a scoring range of zero to 40. The CAT score is classified into four groups of low (less than 10), medium (10 - 20), high (21-30) and very high (31-40) based on the impact level of disease on health status. A CAT score over 10 suggests significant symptoms.  A worsening CAT score could be explained by an exacerbation, poor medication adherence, poor inhaler technique,  or progression of COPD or comorbid conditions.  CAT MCID is 2 points  mMRC: mMRC (Modified Medical Research Council) Dyspnea Scale is used to assess the degree of baseline functional disability in patients of respiratory disease due to dyspnea. No minimal important difference is established. A decrease in score of 1 point or greater is considered a positive change.   Pulmonary Function Assessment: Pulmonary Function Assessment - 12/27/18 1544      Breath   Bilateral Breath Sounds  Clear    Shortness of Breath  Yes;Limiting activity       Exercise Target Goals: Exercise Program Goal: Individual exercise prescription set using results from initial 6 min walk test and THRR while considering  patient's activity barriers and safety.   Exercise Prescription Goal: Initial exercise prescription builds to 30-45 minutes a day of aerobic activity, 2-3 days per week.  Home exercise guidelines will be given to patient during program as part of exercise prescription that the participant will acknowledge.  Activity Barriers & Risk Stratification:   6 Minute Walk: 6 Minute Walk    Row Name 11/09/18 1506 12/27/18 1559       6 Minute Walk   Phase  Discharge  Initial    Distance  1613 feet  1500 feet    Distance % Change  -1.29 %  -    Distance Feet Change  -21 ft  -    Walk Time  6 minutes  6 minutes    # of Rest Breaks  0  0    MPH  3  2.84    METS  3.5  3.14    RPE  12  14    Perceived Dyspnea   0  3    VO2 Peak  12.56  10.89    Symptoms  No  No    Resting HR  75 bpm  86 bpm    Resting BP  116/62  128/74    Resting Oxygen Saturation   -  97 %    Exercise Oxygen Saturation  during 6 min walk  -  89 %    Max Ex. HR  114 bpm  97 bpm    Max Ex. BP  148/72  132/66    2 Minute Post BP  108/62  128/72  Interval HR   1 Minute HR  -  95    2 Minute HR  -  90    3 Minute HR  -  92    4 Minute HR  -  97    5 Minute HR  -  96    6 Minute HR  -  96    2 Minute Post HR  -  115 likely  in Afib    Interval Heart Rate?  -  Yes      Interval Oxygen   Interval Oxygen?  -  Yes    Baseline Oxygen Saturation %  -  97 %    1 Minute Oxygen Saturation %  -  97 %    1 Minute Liters of Oxygen  -  0 L    2 Minute Oxygen Saturation %  -  97 %    2 Minute Liters of Oxygen  -  0 L    3 Minute Oxygen Saturation %  -  89 %    3 Minute Liters of Oxygen  -  0 L    4 Minute Oxygen Saturation %  -  91 %    4 Minute Liters of Oxygen  -  0 L    5 Minute Oxygen Saturation %  -  90 %    5 Minute Liters of Oxygen  -  0 L    6 Minute Oxygen Saturation %  -  90 %    6 Minute Liters of Oxygen  -  0 L    2 Minute Post Oxygen Saturation %  -  98 %    2 Minute Post Liters of Oxygen  -  0 L       Oxygen Initial Assessment: Oxygen Initial Assessment - 12/27/18 1559      Home Oxygen   Home Oxygen Device  None    Sleep Oxygen Prescription  None    Home Exercise Oxygen Prescription  None    Home at Rest Exercise Oxygen Prescription  None    Compliance with Home Oxygen Use  Yes      Initial 6 min Walk   Oxygen Used  None      Program Oxygen Prescription   Program Oxygen Prescription  None      Intervention   Short Term Goals  To learn and exhibit compliance with exercise, home and travel O2 prescription;To learn and understand importance of monitoring SPO2 with pulse oximeter and demonstrate accurate use of the pulse oximeter.;To learn and understand importance of maintaining oxygen saturations>88%;To learn and demonstrate proper pursed lip breathing techniques or other breathing techniques.;To learn and demonstrate proper use of respiratory medications    Long  Term Goals  Exhibits compliance with exercise, home and travel O2 prescription;Verbalizes importance of monitoring SPO2 with pulse oximeter and return demonstration;Maintenance of O2 saturations>88%;Exhibits proper breathing techniques, such as pursed lip breathing or other method taught during program session;Compliance with  respiratory medication       Oxygen Re-Evaluation:   Oxygen Discharge (Final Oxygen Re-Evaluation):   Initial Exercise Prescription: Initial Exercise Prescription - 12/27/18 1600      Date of Initial Exercise RX and Referring Provider   Date  12/27/18    Referring Provider  [name]      Treadmill   MPH  2.2    Grade  1    Minutes  15      NuStep   Level  3  SPM  80    Minutes  15      Prescription Details   Frequency (times per week)  2    Duration  Progress to 30 minutes of continuous aerobic without signs/symptoms of physical distress      Intensity   THRR 40-80% of Max Heartrate  56-112    Ratings of Perceived Exertion  11-13    Perceived Dyspnea  0-4      Progression   Progression  Continue to progress workloads to maintain intensity without signs/symptoms of physical distress.      Resistance Training   Training Prescription  Yes    Weight  orange bands    Reps  10-15       Perform Capillary Blood Glucose checks as needed.  Exercise Prescription Changes: Exercise Prescription Changes    Row Name 09/26/18 1500 10/12/18 1339 11/09/18 1500         Response to Exercise   Blood Pressure (Admit)  124/56  122/46  116/62     Blood Pressure (Exercise)  130/72  142/62  148/72     Blood Pressure (Exit)  120/62  122/60  108/62     Heart Rate (Admit)  85 bpm  82 bpm  75 bpm     Heart Rate (Exercise)  102 bpm  94 bpm  114 bpm     Heart Rate (Exit)  81 bpm  86 bpm  78 bpm     Rating of Perceived Exertion (Exercise)  _0 Perceived Dyspnea (Exercise)  0  0  0     Symptoms  None  None  None     Comments  Pt's first day of exercise since closure  -  Pt graduated CR program today.      Duration  Progress to 30 minutes of  aerobic without signs/symptoms of physical distress  Continue with 30 min of aerobic exercise without signs/symptoms of physical distress.  Continue with 30 min of aerobic exercise without signs/symptoms of physical distress.      Intensity  THRR unchanged  THRR unchanged  THRR unchanged       Progression   Progression  Continue to progress workloads to maintain intensity without signs/symptoms of physical distress.  Continue to progress workloads to maintain intensity without signs/symptoms of physical distress.  Continue to progress workloads to maintain intensity without signs/symptoms of physical distress.     Average METs  2.3  2.9  2.9       Resistance Training   Training Prescription  Yes  No Relaxation day, no weights.  No Relaxation day, no weights.     Weight  3lbs  -  -     Reps  10-15  -  -     Time  10 Minutes  -  -       Interval Training   Interval Training  -  No  No       Recumbant Bike   Level  1._1 Watts  -  23  23     Minutes  _2 METs  1.9  3.05  2.3       NuStep   Level  _3 SPM  85  85  85     Minutes  _4 METs  2.7  2.7  3.4       Home Exercise Plan   Plans to continue exercise at  Home (comment) Walking  Home (comment) Walking  Home (comment) Walking     Frequency  Add 3 additional days to program exercise sessions.  Add 4 additional days to program exercise sessions.  Add 4 additional days to program exercise sessions.     Initial Home Exercises Provided  06/07/18  06/07/18  06/07/18        Exercise Comments: Exercise Comments    Row Name 09/26/18 1528 10/12/18 1400 11/09/18 1519       Exercise Comments  Pt's first day of exercise since departmental closure due to COVID 19. Pt tolerated exercise well. Will continue to monitor pt's progress.  Reviewed goals with patient. Pt is walking and doing resistance training at home in addition to exercise at cardiac rehab.  Pt graduated CR program today. Pt will continue walking at home for exercise.        Exercise Goals and Review: Exercise Goals    Row Name 12/27/18 1606             Exercise Goals   Increase Physical Activity  Yes       Intervention  Provide advice, education, support  and counseling about physical activity/exercise needs.;Develop an individualized exercise prescription for aerobic and resistive training based on initial evaluation findings, risk stratification, comorbidities and participant's personal goals.       Expected Outcomes  Short Term: Attend rehab on a regular basis to increase amount of physical activity.;Long Term: Exercising regularly at least 3-5 days a week.;Long Term: Add in home exercise to make exercise part of routine and to increase amount of physical activity.       Increase Strength and Stamina  Yes       Intervention  Provide advice, education, support and counseling about physical activity/exercise needs.;Develop an individualized exercise prescription for aerobic and resistive training based on initial evaluation findings, risk stratification, comorbidities and participant's personal goals.       Expected Outcomes  Short Term: Increase workloads from initial exercise prescription for resistance, speed, and METs.;Short Term: Perform resistance training exercises routinely during rehab and add in resistance training at home;Long Term: Improve cardiorespiratory fitness, muscular endurance and strength as measured by increased METs and functional capacity (6MWT)       Able to understand and use rate of perceived exertion (RPE) scale  Yes       Intervention  Provide education and explanation on how to use RPE scale       Expected Outcomes  Short Term: Able to use RPE daily in rehab to express subjective intensity level;Long Term:  Able to use RPE to guide intensity level when exercising independently       Able to understand and use Dyspnea scale  Yes       Intervention  Provide education and explanation on how to use Dyspnea scale       Expected Outcomes  Short Term: Able to use Dyspnea scale daily in rehab to express subjective sense of shortness of breath during exertion;Long Term: Able to use Dyspnea scale to guide intensity level when exercising  independently       Knowledge and understanding of Target Heart Rate Range (THRR)  Yes       Intervention  Provide education and explanation of THRR including how the numbers were predicted and where they are located for reference  Expected Outcomes  Long Term: Able to use THRR to govern intensity when exercising independently;Short Term: Able to state/look up THRR;Short Term: Able to use daily as guideline for intensity in rehab       Understanding of Exercise Prescription  Yes       Intervention  Provide education, explanation, and written materials on patient's individual exercise prescription       Expected Outcomes  Short Term: Able to explain program exercise prescription;Long Term: Able to explain home exercise prescription to exercise independently          Exercise Goals Re-Evaluation : Exercise Goals Re-Evaluation    Row Name 10/12/18 1400 11/09/18 1517           Exercise Goal Re-Evaluation   Exercise Goals Review  Increase Physical Activity;Able to understand and use rate of perceived exertion (RPE) scale;Knowledge and understanding of Target Heart Rate Range (THRR);Understanding of Exercise Prescription;Increase Strength and Stamina  Increase Physical Activity;Understanding of Exercise Prescription;Increase Strength and Stamina;Knowledge and understanding of Target Heart Rate Range (THRR);Able to understand and use rate of perceived exertion (RPE) scale;Able to check pulse independently      Comments  Patient is walking at least 20-30 minutes 5-7 days/week. Pt also has 10lb weights at home that he uses for his resistance training.  Pt graduated CR program today. Pt tolerated exercise prescritptions well and increased resistance on all exercises gradually through program. Pt is walking 30 minutes poer day weather permitting and plans to continue to do so. Pt understands goals going forward with graduating.      Expected Outcomes  Increase workloads as tolerated to help increase  strength and stamina.  Pt will continue exercising at home after graduating.         Discharge Exercise Prescription (Final Exercise Prescription Changes): Exercise Prescription Changes - 11/09/18 1500      Response to Exercise   Blood Pressure (Admit)  116/62    Blood Pressure (Exercise)  148/72    Blood Pressure (Exit)  108/62    Heart Rate (Admit)  75 bpm    Heart Rate (Exercise)  114 bpm    Heart Rate (Exit)  78 bpm    Rating of Perceived Exertion (Exercise)  12    Perceived Dyspnea (Exercise)  0    Symptoms  None    Comments  Pt graduated CR program today.     Duration  Continue with 30 min of aerobic exercise without signs/symptoms of physical distress.    Intensity  THRR unchanged      Progression   Progression  Continue to progress workloads to maintain intensity without signs/symptoms of physical distress.    Average METs  2.9      Resistance Training   Training Prescription  No   Relaxation day, no weights.     Interval Training   Interval Training  No      Recumbant Bike   Level  3    Watts  23    Minutes  15    METs  2.3      NuStep   Level  4    SPM  85    Minutes  15    METs  3.4      Home Exercise Plan   Plans to continue exercise at  Home (comment)   Walking   Frequency  Add 4 additional days to program exercise sessions.    Initial Home Exercises Provided  06/07/18       Nutrition:  Target Goals: Understanding of nutrition guidelines, daily intake of sodium <1530m, cholesterol <2023m calories 30% from fat and 7% or less from saturated fats, daily to have 5 or more servings of fruits and vegetables.  Biometrics: Pre Biometrics - 12/27/18 1519      Pre Biometrics   Height  _0  (1.778 m)  (Pended)       Post Biometrics - 11/09/18 1509       Post  Biometrics   Height  _1  (1.753 m)    Weight  68.4 kg    Waist Circumference  32 inches    Hip Circumference  37.5 inches    Waist to Hip Ratio  0.85 %    BMI (Calculated)  22.26     Triceps Skinfold  18 mm    % Body Fat  22.9 %    Grip Strength  40 kg    Flexibility  0 in    Single Leg Stand  3.18 seconds       Nutrition Therapy Plan and Nutrition Goals:   Nutrition Assessments:   Nutrition Goals Re-Evaluation:   Nutrition Goals Discharge (Final Nutrition Goals Re-Evaluation):   Psychosocial: Target Goals: Acknowledge presence or absence of significant depression and/or stress, maximize coping skills, provide positive support system. Participant is able to verbalize types and ability to use techniques and skills needed for reducing stress and depression.  Initial Review & Psychosocial Screening: Initial Psych Review & Screening - 12/27/18 1545      Initial Review   Current issues with  Current Stress Concerns    Source of Stress Concerns  Chronic Illness    Comments  ejection fraction 10-15% and patient is struggling with end of life issues.      Family Dynamics   Good Support System?  Yes      Barriers   Psychosocial barriers to participate in program  There are no identifiable barriers or psychosocial needs.   patient is handling end of life issues well, considering counselling     Screening Interventions   Interventions  Encouraged to exercise   suggested counselling tp patient for him and spouse      Quality of Life Scores: Quality of Life - 11/09/18 1529      Quality of Life   Select  Quality of Life      Quality of Life Scores   Health/Function Pre  18.2 %    Socioeconomic Pre  27.33 %    Psych/Spiritual Pre  24.75 %    Family Pre  24 %    GLOBAL Pre  22.05 %      Scores of 19 and below usually indicate a poorer quality of life in these areas.  A difference of  2-3 points is a clinically meaningful difference.  A difference of 2-3 points in the total score of the Quality of Life Index has been associated with significant improvement in overall quality of life, self-image, physical symptoms, and general health in studies assessing  change in quality of life.  PHQ-9: Recent Review Flowsheet Data    [facility] 12/27/2018 11/09/2018 09/27/2018 03/29/2018 01/19/2018   Decreased Interest 0 0 0 0 0   Down, Depressed, Hopeless 0 0 0 0 0   PHQ - 2 Score 0 0 0 0 0   Altered sleeping 2 - - - -   Tired, decreased energy 3 - - - -   Change in appetite 1 - - - -   Trouble  concentrating 0 - - - -   Moving slowly or fidgety/restless 0 - - - -   Suicidal thoughts 0 - - - -   Difficult doing work/chores Not difficult at all - - - -     Interpretation of Total Score  Total Score Depression Severity:  1-4 = Minimal depression, 5-9 = Mild depression, 10-14 = Moderate depression, 15-19 = Moderately severe depression, 20-27 = Severe depression   Psychosocial Evaluation and Intervention: Psychosocial Evaluation - 12/27/18 1547      Psychosocial Evaluation & Interventions   Interventions  Encouraged to exercise with the program and follow exercise prescription;Stress management education    Comments  Struggling with end of life questions and decisions    Expected Outcomes  Develop healthy coping mechanisms to deal with stress    Continue Psychosocial Services   Follow up required by staff       Psychosocial Re-Evaluation: Psychosocial Re-Evaluation    [place] Name 10/13/18 1555 12/27/18 1550           Psychosocial Re-Evaluation   Current issues with  None Identified  Current Stress Concerns      Comments  no psychosocial needs identified, no interventions necessary  -      Expected Outcomes  pt will exhibit positive outlook with good coping skills.   -      Interventions  Encouraged to attend Cardiac Rehabilitation for the exercise  -      Continue Psychosocial Services   No Follow up required  -         Psychosocial Discharge (Final Psychosocial Re-Evaluation): Psychosocial Re-Evaluation - 12/27/18 1550      Psychosocial Re-Evaluation   Current issues with  Current Stress Concerns        Education: Education Goals: Education classes will be provided on a weekly basis, covering required topics. Participant will state understanding/return demonstration of topics presented.  Learning Barriers/Preferences: Learning Barriers/Preferences - 12/27/18 1550      Learning Barriers/Preferences   Learning Barriers  None    Learning Preferences  Audio;Computer/Internet;Group Instruction;Individual Instruction;Pictoral;Skilled Demonstration;Verbal Instruction;Video;Written Material       Education Topics: Risk Factor Reduction:  -Group instruction that is supported by a PowerPoint presentation. Instructor discusses the definition of a risk factor, different risk factors for pulmonary disease, and how the heart and lungs work together.     Nutrition for Pulmonary Patient:  -Group instruction provided by PowerPoint slides, verbal discussion, and written materials to support subject matter. The instructor gives an explanation and review of healthy diet recommendations, which includes a discussion on weight management, recommendations for fruit and vegetable consumption, as well as protein, fluid, caffeine, fiber, sodium, sugar, and alcohol. Tips for eating when patients are short of breath are discussed.   Pursed Lip Breathing:  -Group instruction that is supported by demonstration and informational handouts. Instructor discusses the benefits of pursed lip and diaphragmatic breathing and detailed demonstration on how to preform both.     Oxygen Safety:  -Group instruction provided by PowerPoint, verbal discussion, and written material to support subject matter. There is an overview of "What is Oxygen" and "Why do we need it".  Instructor also reviews how to create a safe environment for oxygen use, the importance of using oxygen as prescribed, and the risks of noncompliance. There is a brief discussion on traveling with oxygen and resources the patient may utilize.   Oxygen  Equipment:  -Group instruction provided by [facility] Staff utilizing handouts, written materials, and equipment  demonstrations.   Signs and Symptoms:  -Group instruction provided by written material and verbal discussion to support subject matter. Warning signs and symptoms of infection, stroke, and heart attack are reviewed and when to call the physician/911 reinforced. Tips for preventing the spread of infection discussed.   Advanced Directives:  -Group instruction provided by verbal instruction and written material to support subject matter. Instructor reviews Advanced Directive laws and proper instruction for filling out document.   Pulmonary Video:  -Group video education that reviews the importance of medication and oxygen compliance, exercise, good nutrition, pulmonary hygiene, and pursed lip and diaphragmatic breathing for the pulmonary patient.   Exercise for the Pulmonary Patient:  -Group instruction that is supported by a PowerPoint presentation. Instructor discusses benefits of exercise, core components of exercise, frequency, duration, and intensity of an exercise routine, importance of utilizing pulse oximetry during exercise, safety while exercising, and options of places to exercise outside of rehab.     Pulmonary Medications:  -Verbally interactive group education provided by instructor with focus on inhaled medications and proper administration.   Anatomy and Physiology of the Respiratory System and Intimacy:  -Group instruction provided by PowerPoint, verbal discussion, and written material to support subject matter. Instructor reviews respiratory cycle and anatomical components of the respiratory system and their functions. Instructor also reviews differences in obstructive and restrictive respiratory diseases with examples of each. Intimacy, Sex, and Sexuality differences are reviewed with a discussion on how relationships can change when diagnosed with pulmonary  disease. Common sexual concerns are reviewed.   MD DAY -A group question and answer session with a medical doctor that allows participants to ask questions that relate to their pulmonary disease state.   OTHER EDUCATION -Group or individual verbal, written, or video instructions that support the educational goals of the pulmonary rehab program.   Holiday Eating Survival Tips:  -Group instruction provided by PowerPoint slides, verbal discussion, and written materials to support subject matter. The instructor gives patients tips, tricks, and techniques to help them not only survive but enjoy the holidays despite the onslaught of food that accompanies the holidays.   Knowledge Questionnaire Score: Knowledge Questionnaire Score - 12/27/18 1551      Knowledge Questionnaire Score   Pre Score  14/18       Core Components/Risk Factors/Patient Goals at Admission: Personal Goals and Risk Factors at Admission - 12/27/18 1551      Core Components/Risk Factors/Patient Goals on Admission   Improve shortness of breath with ADL's  Yes    Heart Failure  Yes    Intervention  --   monitor weights and medication compliance   Expected Outcomes  Improve functional capacity of life;Short term: Attendance in program 2-3 days a week with increased exercise capacity. Reported lower sodium intake. Reported increased fruit and vegetable intake. Reports medication compliance.;Short term: Daily weights obtained and reported for increase. Utilizing diuretic protocols set by physician.;Long term: Adoption of self-care skills and reduction of barriers for early signs and symptoms recognition and intervention leading to self-care maintenance.    Stress  Yes    Intervention  Offer individual and/or small group education and counseling on adjustment to heart disease, stress management and health-related lifestyle change. Teach and support self-help strategies.;Refer participants experiencing significant psychosocial  distress to appropriate mental health specialists for further evaluation and treatment. When possible, include family members and significant others in education/counseling sessions.    Expected Outcomes  Short Term: Participant demonstrates changes in health-related behavior, relaxation and other stress  management skills, ability to obtain effective social support, and compliance with psychotropic medications if prescribed.;Long Term: Emotional wellbeing is indicated by absence of clinically significant psychosocial distress or social isolation.       Core Components/Risk Factors/Patient Goals Review:  Goals and Risk Factor Review    Row Name 10/13/18 1555 11/10/18 1525 12/27/18 1552         Core Components/Risk Factors/Patient Goals Review   Personal Goals Review  Improve shortness of breath with ADL's;Hypertension;Lipids;Stress  Improve shortness of breath with ADL's;Hypertension;Lipids;Stress  Stress;Improve shortness of breath with ADL's;Heart Failure;Develop more efficient breathing techniques such as purse lipped breathing and diaphragmatic breathing and practicing self-pacing with activity.     Review  Langford has been doing well with exercise since cardiac rehab restarted. Justyce vital signs have been stable  Naol has been doing well with exercise since cardiac rehab restarted. Cesario vital signs have been stable. Wilmon graduated on 11/10/18 and plans to continue exercising by walking weather permitting.  -     Expected Outcomes  pt will participate in exercise, nutrition and lifestyle modification to decrease disease progress and improved quality of life  Patient will continue to exercise upon discharge from cardiac rehab continue to follow nutrition and lifestyle modifications.  -        Core Components/Risk Factors/Patient Goals at Discharge (Final Review):  Goals and Risk Factor Review - 12/27/18 1552      Core Components/Risk Factors/Patient Goals Review   Personal Goals Review   Stress;Improve shortness of breath with ADL's;Heart Failure;Develop more efficient breathing techniques such as purse lipped breathing and diaphragmatic breathing and practicing self-pacing with activity.       ITP Comments: ITP Comments    Row Name 10/13/18 1600           ITP Comments  30 Day ITP Review. Patient has been with good participation and attendance since cardiac rehab resumed          Comments:

## 2018-12-27 NOTE — Progress Notes (Signed)
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 male Pulmonary Rehab Orientation Note Patient arrived today in Cardiac and Pulmonary Rehab for orientation to Pulmonary Rehab. He walked from the [place] lot with moderate shortness of breath. He does not carry portable oxygen. Per pt, he uses oxygen never. Color good, skin warm and dry. Patient is oriented to time and place. Patient's medical history, psychosocial health, and medications reviewed. Psychosocial assessment reveals pt lives with their spouse. Pt is currently retired as a Engineer, water . Pt reports his stress level is moderate. Areas of stress/anxiety include an ejection fraction of 10-15% and is struggling with his end of liife.  Pt does exhibit signs of depression. Signs of depression include and difficulty falling asleep. PHQ2/9 score 0/4. We discussed the possibility of talking with a counsellor, so that he and his wife could talk through their concerns and fears.Pt shows good  coping skills with positive outlook .  Offered emotional support and reassurance. Will continue to monitor and evaluate progress toward psychosocial goal(s) of no barriers to participation in pulmonary rehab. Physical assessment reveals heart rate is rate controlled atrial fib, breath sounds clear to auscultation, no wheezes, rales, or rhonchi. Grip strength equal, strong. Distal pulses 2+ bilateral posterior tibial pulses present with 2+ ankle edema. Patient reports he does take medications as prescribed. Patient states he follows a Low Sodium diet. The patient reports no specific efforts to gain or lose weight.. Patient's weight will be monitored closely. Demonstration and practice of PLB using pulse oximeter. Patient able to return demonstration satisfactorily. Safety and hand hygiene in the exercise area reviewed with patient. Patient voices understanding of the information reviewed. Department expectations discussed with patient and achievable goals were set. The patient shows  enthusiasm about attending the program and we look forward to working with this nice gentleman. The patient completed a 6 min walk test today and to begin exercise on Tuesday, January 03, 2019 in the 1300 class.  [id number]

## 2018-12-29 NOTE — Telephone Encounter (Signed)
I'd rather have him on 20 mg daily but he wants to take 2.5 so I'm ok with that if he can get it.

## 2018-12-30 ENCOUNTER — Telehealth (HOSPITAL_COMMUNITY): Payer: Self-pay

## 2018-12-30 NOTE — Telephone Encounter (Signed)
-----  Message from Larey Dresser, MD sent at 12/23/2018  3:45 PM EDT ----- Mild stenosis but bulky plaque.  Repeat study 1 year.

## 2018-12-30 NOTE — Telephone Encounter (Signed)
Pt aware of results 

## 2019-01-02 ENCOUNTER — Other Ambulatory Visit (HOSPITAL_COMMUNITY): Payer: Medicare Other

## 2019-01-02 ENCOUNTER — Telehealth (HOSPITAL_COMMUNITY): Payer: Self-pay | Admitting: Pharmacy Technician

## 2019-01-02 NOTE — Telephone Encounter (Signed)
Spoke to patient. He is fine with the $18.93 for the 2.89m Tadalafil out of pocket. Will continue to go forward with this strength until he and Dr. MAundra Dubinchange that.   ECharlann Boxer CPhT

## 2019-01-03 ENCOUNTER — Other Ambulatory Visit: Payer: Self-pay

## 2019-01-03 ENCOUNTER — Encounter (HOSPITAL_COMMUNITY)
Admission: RE | Admit: 2019-01-03 | Discharge: 2019-01-03 | Disposition: A | Discharge status: Home / Self Care | Payer: Medicare Other | Source: Ambulatory Visit | Attending: Cardiology | Admitting: Cardiology

## 2019-01-03 DIAGNOSIS — I5022 Chronic systolic (congestive) heart failure: Secondary | ICD-10-CM | POA: Diagnosis not present

## 2019-01-03 MED FILL — EPLERENONE 25 MG TABLET: 25 | 30 days supply | Qty: 30 | Fill #9

## 2019-01-03 NOTE — Progress Notes (Signed)
Daily Session Note  Patient Details  Name: Caleb SOWARD, PhD MRN: [id number] Date of Birth: 26-Mar-1940 Referring Provider:     Pulmonary Rehab Walk Test from 12/27/2018 in [place]  Referring Provider  [name]      Encounter Date: 01/03/2019  Check In: Session Check In - 01/03/19 1355      Check-In   Supervising physician immediately available to respond to emergencies  [facility] immediately available    Physician(s)  Dr. Darliss Cheney    Location  [facility]    Staff Present  Hoy Register, MS, Exercise Physiologist;Lisa Ysidro Evert, RN;Joan Leonia Reeves, RN, BSN    Virtual Visit  No    Medication changes reported      No    Fall or balance concerns reported     No    Tobacco Cessation  No Change    Warm-up and Cool-down  Performed on first and last piece of equipment    Resistance Training Performed  Yes    VAD Patient?  No    PAD/SET Patient?  No      Pain Assessment   Currently in Pain?  No/denies    Multiple Pain Sites  No       Capillary Blood Glucose: No results found for this or any previous visit (from the past 24 hour(s)).    Social History   Tobacco Use  Smoking Status Never Smoker  Smokeless Tobacco Never Used    Goals Met:  Exercise tolerated well  Goals Unmet:  Not Applicable  Comments: Service time is from 4193 to 48    Dr. Rush Farmer is Medical Director for Pulmonary Rehab at [facility].

## 2019-01-05 ENCOUNTER — Encounter (HOSPITAL_COMMUNITY)
Admission: RE | Admit: 2019-01-05 | Discharge: 2019-01-05 | Disposition: A | Discharge status: Home / Self Care | Payer: Medicare Other | Source: Ambulatory Visit | Attending: Cardiology | Admitting: Cardiology

## 2019-01-05 ENCOUNTER — Other Ambulatory Visit: Payer: Self-pay

## 2019-01-05 DIAGNOSIS — I5022 Chronic systolic (congestive) heart failure: Secondary | ICD-10-CM | POA: Diagnosis not present

## 2019-01-05 NOTE — Progress Notes (Signed)
Daily Session Note  Patient Details  Name: Caleb POWLEY, PhD MRN: [id number] Date of Birth: 15-Jan-1941 Referring Provider:     Pulmonary Rehab Walk Test from 12/27/2018 in [place]  Referring Provider  [name]      Encounter Date: 01/05/2019  Check In: Session Check In - 01/05/19 1456      Check-In   Supervising physician immediately available to respond to emergencies  [facility] immediately available    Physician(s)  Dr. Doristine Bosworth    Location  [facility]    Staff Present  Hoy Register, MS, Exercise Physiologist;Jenin Birdsall Ysidro Evert, RN;Portia Rollene Rotunda, RN, BSN    Virtual Visit  No    Medication changes reported      No    Fall or balance concerns reported     No    Tobacco Cessation  No Change    Warm-up and Cool-down  Performed on first and last piece of equipment    Resistance Training Performed  Yes    VAD Patient?  No    PAD/SET Patient?  No      Pain Assessment   Currently in Pain?  No/denies    Multiple Pain Sites  No       Capillary Blood Glucose: No results found for this or any previous visit (from the past 24 hour(s)).    Social History   Tobacco Use  Smoking Status Never Smoker  Smokeless Tobacco Never Used    Goals Met:  Exercise tolerated well No report of cardiac concerns or symptoms Strength training completed today  Goals Unmet:  Not Applicable  Comments: Service time is from 1249 to 1355    Dr. Rush Farmer is Medical Director for Pulmonary Rehab at [facility]

## 2019-01-09 ENCOUNTER — Other Ambulatory Visit (HOSPITAL_COMMUNITY)
Admission: RE | Admit: 2019-01-09 | Discharge: 2019-01-09 | Disposition: A | Discharge status: Home / Self Care | Payer: Medicare Other | Source: Ambulatory Visit | Attending: Cardiology | Admitting: Cardiology

## 2019-01-09 ENCOUNTER — Telehealth (HOSPITAL_COMMUNITY): Payer: Self-pay | Admitting: *Deleted

## 2019-01-09 ENCOUNTER — Inpatient Hospital Stay (HOSPITAL_COMMUNITY): Admission: RE | Admit: 2019-01-09 | Payer: Medicare Other | Source: Ambulatory Visit

## 2019-01-09 DIAGNOSIS — Z20828 Contact with and (suspected) exposure to other viral communicable diseases: Secondary | ICD-10-CM | POA: Insufficient documentation

## 2019-01-09 DIAGNOSIS — Z01812 Encounter for preprocedural laboratory examination: Secondary | ICD-10-CM | POA: Insufficient documentation

## 2019-01-09 LAB — SARS CORONAVIRUS 2 (TAT 6-24 HRS): SARS Coronavirus 2: NEGATIVE

## 2019-01-09 MED ORDER — TADALAFIL (PAH) 20 MG PO TABS: 20.0000 mg | ORAL_TABLET | Freq: Every day | ORAL | 6 refills | Status: DC

## 2019-01-09 MED FILL — TADALAFIL 20 MG TABS: 20 | 60 days supply | Qty: 60 | Fill #0

## 2019-01-09 NOTE — Telephone Encounter (Signed)
Pt left VM stating he wanted to increase his tadalafil.  Per chart pt is only taking 2.5 mg daily and Dr Aundra Dubin wanted him up to 20 mg daily.  Pt will increase to 10 mg daily and see how that goes.  Due to insurance approval I have sent it in for 20 mg daily and advised pt to cut it in half, he is agreeable.

## 2019-01-10 ENCOUNTER — Encounter (HOSPITAL_COMMUNITY)
Admission: RE | Admit: 2019-01-10 | Discharge: 2019-01-10 | Disposition: A | Discharge status: Home / Self Care | Payer: Medicare Other | Source: Ambulatory Visit | Attending: Cardiology | Admitting: Cardiology

## 2019-01-10 ENCOUNTER — Other Ambulatory Visit: Payer: Self-pay

## 2019-01-11 NOTE — Progress Notes (Signed)
Pulmonary Individual Treatment Plan  Patient Details  Name: KRISHAWN VANDERWEELE, PhD MRN: [id number] Date of Birth: 1940/07/31 Referring Provider:     Pulmonary Rehab Walk Test from 12/27/2018 in [facility]  Referring Provider  [name]      Initial Encounter Date:    Pulmonary Rehab Walk Test from 12/27/2018 in [facility]  Date  12/27/18      Visit Diagnosis: Heart failure, chronic systolic ([place])  Patient's Home Medications on Admission:   Current Outpatient Medications:  .  aspirin EC 81 MG tablet, Take 1 tablet (81 mg total) by mouth daily., Disp: 30 tablet, Rfl: 3 .  digoxin (LANOXIN) 0.125 MG tablet, Take a 1/2 tablet by mouth once daily., Disp: 45 tablet, Rfl: 1 .  eplerenone (INSPRA) 25 MG tablet, TAKE 1 TABLET BY MOUTH ONCE DAILY, Disp: 30 tablet, Rfl: 11 .  magnesium gluconate (MAGONATE) 500 MG tablet, Take 500 mg by mouth 2 (two) times daily. , Disp: , Rfl:  .  Omega-3 Fatty Acids (FISH OIL PO), Take 360 mg by mouth daily. , Disp: , Rfl:  .  potassium chloride (K-DUR,KLOR-CON) 20 MEQ tablet, Take 2.5 tablets (50 mEq total) by mouth daily., Disp: 225 tablet, Rfl: 3 .  tadalafil, PAH, (ADCIRCA) 20 MG tablet, Take 1 tablet (20 mg total) by mouth daily., Disp: 60 tablet, Rfl: 6 .  torsemide (DEMADEX) 20 MG tablet, Take 3 tablets (60 mg total) by mouth daily. Alternate 22m (3 tabs) and 888m(4 tabs) every other day, Disp: 270 tablet, Rfl: 6 .  valsartan (DIOVAN) 40 MG tablet, Take 4048mn the morning and 39m65m the evening, Disp: 270 tablet, Rfl: 3  Past Medical History: Past Medical History:  Diagnosis Date  . Allergic rhinitis   . Atrial fibrillation (HCC)[place]. Atypical pneumonia   . CAD (coronary artery disease)   . Cardiomyopathy, ischemic   . Chronic anticoagulation   . Cough   . Dizziness   . GERD (gastroesophageal reflux disease)   . Heart failure, systolic, acute on chronic (HCC)[place]. Hyperlipidemia    . Hypertension   . OSA (obstructive sleep apnea)    Home sleep test 07/05/2009 AHI 8.2  . Pleural effusion   . Positional vertigo   . TIA (transient ischemic attack)     Tobacco Use: Social History   Tobacco Use  Smoking Status Never Smoker  Smokeless Tobacco Never Used    Labs: Recent Review Flowsheet Data    Labs for ITP Cardiac and Pulmonary Rehab Latest Ref Rng & Units 01/10/2016 01/10/2016 08/17/2017 10/07/2017 11/16/2017   Cholestrol 0 - 200 mg/dL - - 212(H) 206(H) -   LDLCALC 0 - 99 mg/dL - - 140(H) 136(H) -   LDLDIRECT mg/dL - - - - -   HDL >40 mg/dL - - 55 53 -   Trlycerides <150 mg/dL - - 86 84 -   Hemoglobin A1c 4.8 - 5.6 % - - 5.5 - -   PHART 7.350 - 7.450 - - - - -   PCO2ART 35.0 - 45.0 mmHg - - - - -   HCO3 20.0 - 28.0 mmol/L 26.7 26.1 - - 24.8   TCO2 22 - 32 mmol/L 28 27 - - 26   ACIDBASEDEF 0.0 - 2.0 mmol/L - - - - -   O2SAT % 68.0 67.0 - - 69.0      Capillary Blood Glucose: No results found for: GLUCAP  Pulmonary Assessment Scores: Pulmonary Assessment Scores    Row Name 12/27/18 1544         ADL UCSD   ADL Phase  Entry     SOB Score total  40       CAT Score   CAT Score  15       mMRC Score   mMRC Score  3       UCSD: Self-administered rating of dyspnea associated with activities of daily living (ADLs) 6-point scale (0 = "not at all" to 5 = "maximal or unable to do because of breathlessness")  Scoring Scores range from 0 to 120.  Minimally important difference is 5 units  CAT: CAT can identify the health impairment of COPD patients and is better correlated with disease progression.  CAT has a scoring range of zero to 40. The CAT score is classified into four groups of low (less than 10), medium (10 - 20), high (21-30) and very high (31-40) based on the impact level of disease on health status. A CAT score over 10 suggests significant symptoms.  A worsening CAT score could be explained by an exacerbation, poor medication adherence, poor  inhaler technique, or progression of COPD or comorbid conditions.  CAT MCID is 2 points  mMRC: mMRC (Modified Medical Research Council) Dyspnea Scale is used to assess the degree of baseline functional disability in patients of respiratory disease due to dyspnea. No minimal important difference is established. A decrease in score of 1 point or greater is considered a positive change.   Pulmonary Function Assessment: Pulmonary Function Assessment - 12/27/18 1544      Breath   Bilateral Breath Sounds  Clear    Shortness of Breath  Yes;Limiting activity       Exercise Target Goals: Exercise Program Goal: Individual exercise prescription set using results from initial 6 min walk test and THRR while considering  patient's activity barriers and safety.   Exercise Prescription Goal: Initial exercise prescription builds to 30-45 minutes a day of aerobic activity, 2-3 days per week.  Home exercise guidelines will be given to patient during program as part of exercise prescription that the participant will acknowledge.  Activity Barriers & Risk Stratification:   6 Minute Walk: 6 Minute Walk    Row Name 11/09/18 1506 12/27/18 1559       6 Minute Walk   Phase  Discharge  Initial    Distance  1613 feet  1500 feet    Distance % Change  -1.29 %  -    Distance Feet Change  -21 ft  -    Walk Time  6 minutes  6 minutes    # of Rest Breaks  0  0    MPH  3  2.84    METS  3.5  3.14    RPE  12  14    Perceived Dyspnea   0  3    VO2 Peak  12.56  10.89    Symptoms  No  No    Resting HR  75 bpm  86 bpm    Resting BP  116/62  128/74    Resting Oxygen Saturation   -  97 %    Exercise Oxygen Saturation  during 6 min walk  -  89 %    Max Ex. HR  114 bpm  97 bpm    Max Ex. BP  148/72  132/66    2 Minute Post BP  108/62  128/72  Interval HR   1 Minute HR  -  95    2 Minute HR  -  90    3 Minute HR  -  92    4 Minute HR  -  97    5 Minute HR  -  96    6 Minute HR  -  96    2 Minute  Post HR  -  115 likely in Afib    Interval Heart Rate?  -  Yes      Interval Oxygen   Interval Oxygen?  -  Yes    Baseline Oxygen Saturation %  -  97 %    1 Minute Oxygen Saturation %  -  97 %    1 Minute Liters of Oxygen  -  0 L    2 Minute Oxygen Saturation %  -  97 %    2 Minute Liters of Oxygen  -  0 L    3 Minute Oxygen Saturation %  -  89 %    3 Minute Liters of Oxygen  -  0 L    4 Minute Oxygen Saturation %  -  91 %    4 Minute Liters of Oxygen  -  0 L    5 Minute Oxygen Saturation %  -  90 %    5 Minute Liters of Oxygen  -  0 L    6 Minute Oxygen Saturation %  -  90 %    6 Minute Liters of Oxygen  -  0 L    2 Minute Post Oxygen Saturation %  -  98 %    2 Minute Post Liters of Oxygen  -  0 L       Oxygen Initial Assessment: Oxygen Initial Assessment - 12/27/18 1559      Home Oxygen   Home Oxygen Device  None    Sleep Oxygen Prescription  None    Home Exercise Oxygen Prescription  None    Home at Rest Exercise Oxygen Prescription  None    Compliance with Home Oxygen Use  Yes      Initial 6 min Walk   Oxygen Used  None      Program Oxygen Prescription   Program Oxygen Prescription  None      Intervention   Short Term Goals  To learn and exhibit compliance with exercise, home and travel O2 prescription;To learn and understand importance of monitoring SPO2 with pulse oximeter and demonstrate accurate use of the pulse oximeter.;To learn and understand importance of maintaining oxygen saturations>88%;To learn and demonstrate proper pursed lip breathing techniques or other breathing techniques.;To learn and demonstrate proper use of respiratory medications    Long  Term Goals  Exhibits compliance with exercise, home and travel O2 prescription;Verbalizes importance of monitoring SPO2 with pulse oximeter and return demonstration;Maintenance of O2 saturations>88%;Exhibits proper breathing techniques, such as pursed lip breathing or other method taught during program  session;Compliance with respiratory medication       Oxygen Re-Evaluation: Oxygen Re-Evaluation    Row Name 01/10/19 1554             Program Oxygen Prescription   Program Oxygen Prescription  None         Home Oxygen   Home Oxygen Device  None       Sleep Oxygen Prescription  None       Home Exercise Oxygen Prescription  None       Home at  Rest Exercise Oxygen Prescription  None       Compliance with Home Oxygen Use  Yes         Goals/Expected Outcomes   Short Term Goals  To learn and exhibit compliance with exercise, home and travel O2 prescription;To learn and understand importance of monitoring SPO2 with pulse oximeter and demonstrate accurate use of the pulse oximeter.;To learn and understand importance of maintaining oxygen saturations>88%;To learn and demonstrate proper pursed lip breathing techniques or other breathing techniques.;To learn and demonstrate proper use of respiratory medications       Long  Term Goals  Exhibits compliance with exercise, home and travel O2 prescription;Verbalizes importance of monitoring SPO2 with pulse oximeter and return demonstration;Maintenance of O2 saturations>88%;Exhibits proper breathing techniques, such as pursed lip breathing or other method taught during program session;Compliance with respiratory medication       Goals/Expected Outcomes  compliance          Oxygen Discharge (Final Oxygen Re-Evaluation): Oxygen Re-Evaluation - 01/10/19 1554      Program Oxygen Prescription   Program Oxygen Prescription  None      Home Oxygen   Home Oxygen Device  None    Sleep Oxygen Prescription  None    Home Exercise Oxygen Prescription  None    Home at Rest Exercise Oxygen Prescription  None    Compliance with Home Oxygen Use  Yes      Goals/Expected Outcomes   Short Term Goals  To learn and exhibit compliance with exercise, home and travel O2 prescription;To learn and understand importance of monitoring SPO2 with pulse oximeter and  demonstrate accurate use of the pulse oximeter.;To learn and understand importance of maintaining oxygen saturations>88%;To learn and demonstrate proper pursed lip breathing techniques or other breathing techniques.;To learn and demonstrate proper use of respiratory medications    Long  Term Goals  Exhibits compliance with exercise, home and travel O2 prescription;Verbalizes importance of monitoring SPO2 with pulse oximeter and return demonstration;Maintenance of O2 saturations>88%;Exhibits proper breathing techniques, such as pursed lip breathing or other method taught during program session;Compliance with respiratory medication    Goals/Expected Outcomes  compliance       Initial Exercise Prescription: Initial Exercise Prescription - 12/27/18 1600      Date of Initial Exercise RX and Referring Provider   Date  12/27/18    Referring Provider  [name]      Treadmill   MPH  2.2    Grade  1    Minutes  15      NuStep   Level  3    SPM  80    Minutes  15      Prescription Details   Frequency (times per week)  2    Duration  Progress to 30 minutes of continuous aerobic without signs/symptoms of physical distress      Intensity   THRR 40-80% of Max Heartrate  56-112    Ratings of Perceived Exertion  11-13    Perceived Dyspnea  0-4      Progression   Progression  Continue to progress workloads to maintain intensity without signs/symptoms of physical distress.      Resistance Training   Training Prescription  Yes    Weight  orange bands    Reps  10-15       Perform Capillary Blood Glucose checks as needed.  Exercise Prescription Changes: Exercise Prescription Changes    Row Name 09/26/18 1500 10/12/18 1339 11/09/18 1500 01/05/19 1512  Response to Exercise   Blood Pressure (Admit)  124/56  122/46  116/62  132/56    Blood Pressure (Exercise)  130/72  142/62  148/72  118/66    Blood Pressure (Exit)  120/62  122/60  108/62  112/50    Heart Rate (Admit)  85 bpm  82  bpm  75 bpm  81 bpm    Heart Rate (Exercise)  102 bpm  94 bpm  114 bpm  100 bpm    Heart Rate (Exit)  81 bpm  86 bpm  78 bpm  75 bpm    Oxygen Saturation (Admit)  -  -  -  98 %    Oxygen Saturation (Exercise)  -  -  -  98 %    Oxygen Saturation (Exit)  -  -  -  99 %    Rating of Perceived Exertion (Exercise)  _0 Perceived Dyspnea (Exercise)  0  0  0  1    Symptoms  None  None  None  none    Comments  Pt's first day of exercise since closure  -  Pt graduated CR program today.   -    Duration  Progress to 30 minutes of  aerobic without signs/symptoms of physical distress  Continue with 30 min of aerobic exercise without signs/symptoms of physical distress.  Continue with 30 min of aerobic exercise without signs/symptoms of physical distress.  Continue with 30 min of aerobic exercise without signs/symptoms of physical distress.    Intensity  THRR unchanged  THRR unchanged  THRR unchanged  THRR unchanged      Progression   Progression  Continue to progress workloads to maintain intensity without signs/symptoms of physical distress.  Continue to progress workloads to maintain intensity without signs/symptoms of physical distress.  Continue to progress workloads to maintain intensity without signs/symptoms of physical distress.  Continue to progress workloads to maintain intensity without signs/symptoms of physical distress.    Average METs  2.3  2.9  2.9  -      Resistance Training   Training Prescription  Yes  No Relaxation day, no weights.  No Relaxation day, no weights.  Yes    Weight  3lbs  -  -  orange bands    Reps  10-15  -  -  10-15    Time  10 Minutes  -  -  10 Minutes      Interval Training   Interval Training  -  No  No  No      Treadmill   MPH  -  -  -  2.2    Grade  -  -  -  1    Minutes  -  -  -  15      Recumbant Bike   Level  1._1 -    [place]  -  23  23  -    Minutes  _2 -    METs  1.9  3.05  2.3  -      NuStep   Level  _3 SPM   85  85  85  80    Minutes  _4 METs  2.7  2.7  3.4  2.7      Home Exercise Plan   Plans  to continue exercise at  Home (comment) Walking  Home (comment) Walking  Home (comment) Walking  -    Frequency  Add 3 additional days to program exercise sessions.  Add 4 additional days to program exercise sessions.  Add 4 additional days to program exercise sessions.  -    Initial Home Exercises Provided  06/07/18  06/07/18  06/07/18  -       Exercise Comments: Exercise Comments    Row Name 09/26/18 1528 10/12/18 1400 11/09/18 1519       Exercise Comments  Pt's first day of exercise since departmental closure due to COVID 19. Pt tolerated exercise well. Will continue to monitor pt's progress.  Reviewed goals with patient. Pt is walking and doing resistance training at home in addition to exercise at cardiac rehab.  Pt graduated CR program today. Pt will continue walking at home for exercise.        Exercise Goals and Review: Exercise Goals    Row Name 12/27/18 1606 01/10/19 1555           Exercise Goals   Increase Physical Activity  Yes  Yes      Intervention  Provide advice, education, support and counseling about physical activity/exercise needs.;Develop an individualized exercise prescription for aerobic and resistive training based on initial evaluation findings, risk stratification, comorbidities and participant's personal goals.  Provide advice, education, support and counseling about physical activity/exercise needs.;Develop an individualized exercise prescription for aerobic and resistive training based on initial evaluation findings, risk stratification, comorbidities and participant's personal goals.      Expected Outcomes  Short Term: Attend rehab on a regular basis to increase amount of physical activity.;Long Term: Exercising regularly at least 3-5 days a week.;Long Term: Add in home exercise to make exercise part of routine and to increase amount of physical activity.   Short Term: Attend rehab on a regular basis to increase amount of physical activity.;Long Term: Exercising regularly at least 3-5 days a week.;Long Term: Add in home exercise to make exercise part of routine and to increase amount of physical activity.      Increase Strength and Stamina  Yes  Yes      Intervention  Provide advice, education, support and counseling about physical activity/exercise needs.;Develop an individualized exercise prescription for aerobic and resistive training based on initial evaluation findings, risk stratification, comorbidities and participant's personal goals.  Provide advice, education, support and counseling about physical activity/exercise needs.;Develop an individualized exercise prescription for aerobic and resistive training based on initial evaluation findings, risk stratification, comorbidities and participant's personal goals.      Expected Outcomes  Short Term: Increase workloads from initial exercise prescription for resistance, speed, and METs.;Short Term: Perform resistance training exercises routinely during rehab and add in resistance training at home;Long Term: Improve cardiorespiratory fitness, muscular endurance and strength as measured by increased METs and functional capacity (6MWT)  Short Term: Increase workloads from initial exercise prescription for resistance, speed, and METs.;Short Term: Perform resistance training exercises routinely during rehab and add in resistance training at home;Long Term: Improve cardiorespiratory fitness, muscular endurance and strength as measured by increased METs and functional capacity (6MWT)      Able to understand and use rate of perceived exertion (RPE) scale  Yes  Yes      Intervention  Provide education and explanation on how to use RPE scale  Provide education and explanation on how to use RPE scale      Expected Outcomes  Short Term: Able to  use RPE daily in rehab to express subjective intensity level;Long Term:  Able  to use RPE to guide intensity level when exercising independently  Short Term: Able to use RPE daily in rehab to express subjective intensity level;Long Term:  Able to use RPE to guide intensity level when exercising independently      Able to understand and use Dyspnea scale  Yes  Yes      Intervention  Provide education and explanation on how to use Dyspnea scale  Provide education and explanation on how to use Dyspnea scale      Expected Outcomes  Short Term: Able to use Dyspnea scale daily in rehab to express subjective sense of shortness of breath during exertion;Long Term: Able to use Dyspnea scale to guide intensity level when exercising independently  Short Term: Able to use Dyspnea scale daily in rehab to express subjective sense of shortness of breath during exertion;Long Term: Able to use Dyspnea scale to guide intensity level when exercising independently      Knowledge and understanding of Target Heart Rate Range (THRR)  Yes  Yes      Intervention  Provide education and explanation of THRR including how the numbers were predicted and where they are located for reference  Provide education and explanation of THRR including how the numbers were predicted and where they are located for reference      Expected Outcomes  Long Term: Able to use THRR to govern intensity when exercising independently;Short Term: Able to state/look up THRR;Short Term: Able to use daily as guideline for intensity in rehab  Long Term: Able to use THRR to govern intensity when exercising independently;Short Term: Able to state/look up THRR;Short Term: Able to use daily as guideline for intensity in rehab      Understanding of Exercise Prescription  Yes  Yes      Intervention  Provide education, explanation, and written materials on patient's individual exercise prescription  Provide education, explanation, and written materials on patient's individual exercise prescription      Expected Outcomes  Short Term: Able to  explain program exercise prescription;Long Term: Able to explain home exercise prescription to exercise independently  Short Term: Able to explain program exercise prescription;Long Term: Able to explain home exercise prescription to exercise independently         Exercise Goals Re-Evaluation : Exercise Goals Re-Evaluation    [place] Name 10/12/18 1400 11/09/18 1517 01/10/19 1555         Exercise Goal Re-Evaluation   Exercise Goals Review  Increase Physical Activity;Able to understand and use rate of perceived exertion (RPE) scale;Knowledge and understanding of Target Heart Rate Range (THRR);Understanding of Exercise Prescription;Increase Strength and Stamina  Increase Physical Activity;Understanding of Exercise Prescription;Increase Strength and Stamina;Knowledge and understanding of Target Heart Rate Range (THRR);Able to understand and use rate of perceived exertion (RPE) scale;Able to check pulse independently  Increase Physical Activity;Increase Strength and Stamina;Able to understand and use rate of perceived exertion (RPE) scale;Able to understand and use Dyspnea scale;Knowledge and understanding of Target Heart Rate Range (THRR);Understanding of Exercise Prescription     Comments  Patient is walking at least 20-30 minutes 5-7 days/week. Pt also has 10lb weights at home that he uses for his resistance training.  Pt graduated CR program today. Pt tolerated exercise prescritptions well and increased resistance on all exercises gradually through program. Pt is walking 30 minutes poer day weather permitting and plans to continue to do so. Pt understands goals going forward with graduating.  Pt has completed 2 exercise session. Pt had previously finished cardiac rehab a couple of months prior. Pt is motivated to increase his workloads. Pt exercises at 2.7 METs on the stepper. Will continue to monitor and progress as able.     Expected Outcomes  Increase workloads as tolerated to help increase strength and  stamina.  Pt will continue exercising at home after graduating.  Through exercise at rehab and at home, the patient will decrease shortness of breath with daily activities and feel confident in carrying out an exercise regime at home.        Discharge Exercise Prescription (Final Exercise Prescription Changes): Exercise Prescription Changes - 01/05/19 1512      Response to Exercise   Blood Pressure (Admit)  132/56    Blood Pressure (Exercise)  118/66    Blood Pressure (Exit)  112/50    Heart Rate (Admit)  81 bpm    Heart Rate (Exercise)  100 bpm    Heart Rate (Exit)  75 bpm    Oxygen Saturation (Admit)  98 %    Oxygen Saturation (Exercise)  98 %    Oxygen Saturation (Exit)  99 %    Rating of Perceived Exertion (Exercise)  12    Perceived Dyspnea (Exercise)  1    Symptoms  none    Duration  Continue with 30 min of aerobic exercise without signs/symptoms of physical distress.    Intensity  THRR unchanged      Progression   Progression  Continue to progress workloads to maintain intensity without signs/symptoms of physical distress.      Resistance Training   Training Prescription  Yes    Weight  orange bands    Reps  10-15    Time  10 Minutes      Interval Training   Interval Training  No      Treadmill   MPH  2.2    Grade  1    Minutes  15      NuStep   Level  3    SPM  80    Minutes  15    METs  2.7       Nutrition:  Target Goals: Understanding of nutrition guidelines, daily intake of sodium <1558m, cholesterol <2088m calories 30% from fat and 7% or less from saturated fats, daily to have 5 or more servings of fruits and vegetables.  Biometrics: Pre Biometrics - 12/27/18 1519      Pre Biometrics   Height  _0  (1.778 m)  (Pended)       Post Biometrics - 11/09/18 1509       Post  Biometrics   Height  _1  (1.753 m)    Weight  150 lb 12.7 oz (68.4 kg)    Waist Circumference  32 inches    Hip Circumference  37.5 inches    Waist to Hip Ratio  0.85 %     BMI (Calculated)  22.26    Triceps Skinfold  18 mm    % Body Fat  22.9 %    Grip Strength  40 kg    Flexibility  0 in    Single Leg Stand  3.18 seconds       Nutrition Therapy Plan and Nutrition Goals:   Nutrition Assessments:   Nutrition Goals Re-Evaluation:   Nutrition Goals Discharge (Final Nutrition Goals Re-Evaluation):   Psychosocial: Target Goals: Acknowledge presence or absence of significant depression and/or stress, maximize coping skills, provide positive support  system. Participant is able to verbalize types and ability to use techniques and skills needed for reducing stress and depression.  Initial Review & Psychosocial Screening: Initial Psych Review & Screening - 12/27/18 1545      Initial Review   Current issues with  Current Stress Concerns    Source of Stress Concerns  Chronic Illness    Comments  ejection fraction 10-15% and patient is struggling with end of life issues.      Family Dynamics   Good Support System?  Yes      Barriers   Psychosocial barriers to participate in program  There are no identifiable barriers or psychosocial needs.   patient is handling end of life issues well, considering counselling     Screening Interventions   Interventions  Encouraged to exercise   suggested counselling tp patient for him and spouse      Quality of Life Scores: Quality of Life - 11/09/18 1529      Quality of Life   Select  Quality of Life      Quality of Life Scores   Health/Function Pre  18.2 %    Socioeconomic Pre  27.33 %    Psych/Spiritual Pre  24.75 %    Family Pre  24 %    GLOBAL Pre  22.05 %      Scores of 19 and below usually indicate a poorer quality of life in these areas.  A difference of  2-3 points is a clinically meaningful difference.  A difference of 2-3 points in the total score of the Quality of Life Index has been associated with significant improvement in overall quality of life, self-image, physical symptoms, and  general health in studies assessing change in quality of life.  PHQ-9: Recent Review Flowsheet Data    Depression screen [facility] 2/9 12/27/2018 11/09/2018 09/27/2018 03/29/2018 01/19/2018   Decreased Interest 0 0 0 0 0   Down, Depressed, Hopeless 0 0 0 0 0   PHQ - 2 Score 0 0 0 0 0   Altered sleeping 2 - - - -   Tired, decreased energy 3 - - - -   Change in appetite 1 - - - -   Trouble concentrating 0 - - - -   Moving slowly or fidgety/restless 0 - - - -   Suicidal thoughts 0 - - - -   Difficult doing work/chores Not difficult at all - - - -     Interpretation of Total Score  Total Score Depression Severity:  1-4 = Minimal depression, 5-9 = Mild depression, 10-14 = Moderate depression, 15-19 = Moderately severe depression, 20-27 = Severe depression   Psychosocial Evaluation and Intervention: Psychosocial Evaluation - 12/27/18 1547      Psychosocial Evaluation & Interventions   Interventions  Encouraged to exercise with the program and follow exercise prescription;Stress management education    Comments  Struggling with end of life questions and decisions    Expected Outcomes  Develop healthy coping mechanisms to deal with stress    Continue Psychosocial Services   Follow up required by staff       Psychosocial Re-Evaluation: Psychosocial Re-Evaluation    Row Name 10/13/18 1555 12/27/18 1550 01/11/19 1211         Psychosocial Re-Evaluation   Current issues with  None Identified  Current Stress Concerns  Current Stress Concerns     Comments  no psychosocial needs identified, no interventions necessary  -  Patient is dealing with new decrease  in ejection fraction to 10-15%, have discussed seeing a counsellor for he and his wife to work out end of life issues     Expected Outcomes  pt will exhibit positive outlook with good coping skills.   -  -     Interventions  Encouraged to attend Cardiac Rehabilitation for the exercise  -  Stress management education;Encouraged to attend Pulmonary  Rehabilitation for the exercise;Relaxation education     Continue Psychosocial Services   No Follow up required  -  Follow up required by staff     Comments  -  -  Is coping well at this time, but patient's wife is not with patient's end of life issues       Initial Review   Source of Stress Concerns  -  -  Chronic Illness;Unable to participate in former interests or hobbies;Unable to perform yard/household activities        Psychosocial Discharge (Final Psychosocial Re-Evaluation): Psychosocial Re-Evaluation - 01/11/19 1211      Psychosocial Re-Evaluation   Current issues with  Current Stress Concerns    Comments  Patient is dealing with new decrease in ejection fraction to 10-15%, have discussed seeing a counsellor for he and his wife to work out end of life issues    Interventions  Stress management education;Encouraged to attend Pulmonary Rehabilitation for the exercise;Relaxation education    Continue Psychosocial Services   Follow up required by staff    Comments  Is coping well at this time, but patient's wife is not with patient's end of life issues      Initial Review   Source of Stress Concerns  Chronic Illness;Unable to participate in former interests or hobbies;Unable to perform yard/household activities       Education: Education Goals: Education classes will be provided on a weekly basis, covering required topics. Participant will state understanding/return demonstration of topics presented.  Learning Barriers/Preferences: Learning Barriers/Preferences - 12/27/18 1550      Learning Barriers/Preferences   Learning Barriers  None    Learning Preferences  Audio;Computer/Internet;Group Instruction;Individual Instruction;Pictoral;Skilled Demonstration;Verbal Instruction;Video;Written Material       Education Topics: Risk Factor Reduction:  -Group instruction that is supported by a PowerPoint presentation. Instructor discusses the definition of a risk factor, different  risk factors for pulmonary disease, and how the heart and lungs work together.     Nutrition for Pulmonary Patient:  -Group instruction provided by PowerPoint slides, verbal discussion, and written materials to support subject matter. The instructor gives an explanation and review of healthy diet recommendations, which includes a discussion on weight management, recommendations for fruit and vegetable consumption, as well as protein, fluid, caffeine, fiber, sodium, sugar, and alcohol. Tips for eating when patients are short of breath are discussed.   Pursed Lip Breathing:  -Group instruction that is supported by demonstration and informational handouts. Instructor discusses the benefits of pursed lip and diaphragmatic breathing and detailed demonstration on how to preform both.     Oxygen Safety:  -Group instruction provided by PowerPoint, verbal discussion, and written material to support subject matter. There is an overview of "What is Oxygen" and "Why do we need it".  Instructor also reviews how to create a safe environment for oxygen use, the importance of using oxygen as prescribed, and the risks of noncompliance. There is a brief discussion on traveling with oxygen and resources the patient may utilize.   Oxygen Equipment:  -Group instruction provided by [facility] Staff utilizing handouts, written materials, and equipment demonstrations.  Signs and Symptoms:  -Group instruction provided by written material and verbal discussion to support subject matter. Warning signs and symptoms of infection, stroke, and heart attack are reviewed and when to call the physician/911 reinforced. Tips for preventing the spread of infection discussed.   Advanced Directives:  -Group instruction provided by verbal instruction and written material to support subject matter. Instructor reviews Advanced Directive laws and proper instruction for filling out document.   Pulmonary Video:  -Group video  education that reviews the importance of medication and oxygen compliance, exercise, good nutrition, pulmonary hygiene, and pursed lip and diaphragmatic breathing for the pulmonary patient.   Exercise for the Pulmonary Patient:  -Group instruction that is supported by a PowerPoint presentation. Instructor discusses benefits of exercise, core components of exercise, frequency, duration, and intensity of an exercise routine, importance of utilizing pulse oximetry during exercise, safety while exercising, and options of places to exercise outside of rehab.     Pulmonary Medications:  -Verbally interactive group education provided by instructor with focus on inhaled medications and proper administration.   Anatomy and Physiology of the Respiratory System and Intimacy:  -Group instruction provided by PowerPoint, verbal discussion, and written material to support subject matter. Instructor reviews respiratory cycle and anatomical components of the respiratory system and their functions. Instructor also reviews differences in obstructive and restrictive respiratory diseases with examples of each. Intimacy, Sex, and Sexuality differences are reviewed with a discussion on how relationships can change when diagnosed with pulmonary disease. Common sexual concerns are reviewed.   MD DAY -A group question and answer session with a medical doctor that allows participants to ask questions that relate to their pulmonary disease state.   OTHER EDUCATION -Group or individual verbal, written, or video instructions that support the educational goals of the pulmonary rehab program.   Holiday Eating Survival Tips:  -Group instruction provided by PowerPoint slides, verbal discussion, and written materials to support subject matter. The instructor gives patients tips, tricks, and techniques to help them not only survive but enjoy the holidays despite the onslaught of food that accompanies the  holidays.   Knowledge Questionnaire Score: Knowledge Questionnaire Score - 12/27/18 1551      Knowledge Questionnaire Score   Pre Score  14/18       Core Components/Risk Factors/Patient Goals at Admission: Personal Goals and Risk Factors at Admission - 12/27/18 1551      Core Components/Risk Factors/Patient Goals on Admission   Improve shortness of breath with ADL's  Yes    Heart Failure  Yes    Intervention  --   monitor weights and medication compliance   Expected Outcomes  Improve functional capacity of life;Short term: Attendance in program 2-3 days a week with increased exercise capacity. Reported lower sodium intake. Reported increased fruit and vegetable intake. Reports medication compliance.;Short term: Daily weights obtained and reported for increase. Utilizing diuretic protocols set by physician.;Long term: Adoption of self-care skills and reduction of barriers for early signs and symptoms recognition and intervention leading to self-care maintenance.    Stress  Yes    Intervention  Offer individual and/or small group education and counseling on adjustment to heart disease, stress management and health-related lifestyle change. Teach and support self-help strategies.;Refer participants experiencing significant psychosocial distress to appropriate mental health specialists for further evaluation and treatment. When possible, include family members and significant others in education/counseling sessions.    Expected Outcomes  Short Term: Participant demonstrates changes in health-related behavior, relaxation and other stress management skills, ability  to obtain effective social support, and compliance with psychotropic medications if prescribed.;Long Term: Emotional wellbeing is indicated by absence of clinically significant psychosocial distress or social isolation.       Core Components/Risk Factors/Patient Goals Review:  Goals and Risk Factor Review    Row Name 10/13/18 1555  11/10/18 1525 12/27/18 1552 01/11/19 1214       Core Components/Risk Factors/Patient Goals Review   Personal Goals Review  Improve shortness of breath with ADL's;Hypertension;Lipids;Stress  Improve shortness of breath with ADL's;Hypertension;Lipids;Stress  Stress;Improve shortness of breath with ADL's;Heart Failure;Develop more efficient breathing techniques such as purse lipped breathing and diaphragmatic breathing and practicing self-pacing with activity.  Heart Failure;Improve shortness of breath with ADL's;Develop more efficient breathing techniques such as purse lipped breathing and diaphragmatic breathing and practicing self-pacing with activity.    Review  Reynol has been doing well with exercise since cardiac rehab restarted. Montay vital signs have been stable  Jasin has been doing well with exercise since cardiac rehab restarted. Cora vital signs have been stable. Bradey graduated on 11/10/18 and plans to continue exercising by walking weather permitting.  -  Just started program, too early to see progression towards goals    Expected Outcomes  pt will participate in exercise, nutrition and lifestyle modification to decrease disease progress and improved quality of life  Patient will continue to exercise upon discharge from cardiac rehab continue to follow nutrition and lifestyle modifications.  -  See admission goals       Core Components/Risk Factors/Patient Goals at Discharge (Final Review):  Goals and Risk Factor Review - 01/11/19 1214      Core Components/Risk Factors/Patient Goals Review   Personal Goals Review  Heart Failure;Improve shortness of breath with ADL's;Develop more efficient breathing techniques such as purse lipped breathing and diaphragmatic breathing and practicing self-pacing with activity.    Review  Just started program, too early to see progression towards goals    Expected Outcomes  See admission goals       ITP Comments: ITP Comments    Row Name 10/13/18  1600           ITP Comments  30 Day ITP Review. Patient has been with good participation and attendance since cardiac rehab resumed          Comments: ITP REVIEW Pt is making expected progress toward pulmonary rehab goals after completing 2 sessions. Recommend continued exercise, life style modification, education, and utilization of breathing techniques to increase stamina and strength and decrease shortness of breath with exertion.

## 2019-01-12 ENCOUNTER — Encounter (HOSPITAL_COMMUNITY)
Admission: RE | Admit: 2019-01-12 | Discharge: 2019-01-12 | Disposition: A | Discharge status: Home / Self Care | Payer: Medicare Other | Source: Ambulatory Visit | Attending: Cardiology | Admitting: Cardiology

## 2019-01-12 ENCOUNTER — Encounter (HOSPITAL_COMMUNITY): Payer: Medicare Other

## 2019-01-12 DIAGNOSIS — I5022 Chronic systolic (congestive) heart failure: Secondary | ICD-10-CM

## 2019-01-16 ENCOUNTER — Ambulatory Visit (HOSPITAL_COMMUNITY)
Admission: RE | Admit: 2019-01-16 | Discharge: 2019-01-16 | Disposition: A | Discharge status: Home / Self Care | Payer: Medicare Other | Source: Ambulatory Visit | Attending: Cardiology | Admitting: Cardiology

## 2019-01-16 ENCOUNTER — Encounter (HOSPITAL_COMMUNITY): Payer: Self-pay | Admitting: Cardiology

## 2019-01-16 ENCOUNTER — Other Ambulatory Visit: Payer: Self-pay

## 2019-01-16 VITALS — BP 133/51 | HR 75 | Wt 149.4 lb

## 2019-01-16 DIAGNOSIS — I110 Hypertensive heart disease with heart failure: Secondary | ICD-10-CM | POA: Insufficient documentation

## 2019-01-16 DIAGNOSIS — I480 Paroxysmal atrial fibrillation: Secondary | ICD-10-CM | POA: Diagnosis not present

## 2019-01-16 DIAGNOSIS — Z79899 Other long term (current) drug therapy: Secondary | ICD-10-CM | POA: Insufficient documentation

## 2019-01-16 DIAGNOSIS — Z8673 Personal history of transient ischemic attack (TIA), and cerebral infarction without residual deficits: Secondary | ICD-10-CM | POA: Insufficient documentation

## 2019-01-16 DIAGNOSIS — I5022 Chronic systolic (congestive) heart failure: Secondary | ICD-10-CM | POA: Insufficient documentation

## 2019-01-16 DIAGNOSIS — I252 Old myocardial infarction: Secondary | ICD-10-CM | POA: Insufficient documentation

## 2019-01-16 DIAGNOSIS — I4892 Unspecified atrial flutter: Secondary | ICD-10-CM | POA: Insufficient documentation

## 2019-01-16 DIAGNOSIS — I272 Pulmonary hypertension, unspecified: Secondary | ICD-10-CM | POA: Diagnosis not present

## 2019-01-16 DIAGNOSIS — I080 Rheumatic disorders of both mitral and aortic valves: Secondary | ICD-10-CM | POA: Insufficient documentation

## 2019-01-16 DIAGNOSIS — I872 Venous insufficiency (chronic) (peripheral): Secondary | ICD-10-CM | POA: Diagnosis not present

## 2019-01-16 DIAGNOSIS — E785 Hyperlipidemia, unspecified: Secondary | ICD-10-CM | POA: Insufficient documentation

## 2019-01-16 DIAGNOSIS — G4733 Obstructive sleep apnea (adult) (pediatric): Secondary | ICD-10-CM | POA: Insufficient documentation

## 2019-01-16 DIAGNOSIS — I2721 Secondary pulmonary arterial hypertension: Secondary | ICD-10-CM | POA: Insufficient documentation

## 2019-01-16 DIAGNOSIS — I2510 Atherosclerotic heart disease of native coronary artery without angina pectoris: Secondary | ICD-10-CM | POA: Insufficient documentation

## 2019-01-16 DIAGNOSIS — Z7982 Long term (current) use of aspirin: Secondary | ICD-10-CM | POA: Insufficient documentation

## 2019-01-16 DIAGNOSIS — I255 Ischemic cardiomyopathy: Secondary | ICD-10-CM | POA: Insufficient documentation

## 2019-01-16 LAB — BASIC METABOLIC PANEL
Anion gap: 12 (ref 5–15)
BUN: 17 mg/dL (ref 8–23)
CO2: 27 mmol/L (ref 22–32)
Calcium: 8.9 mg/dL (ref 8.9–10.3)
Chloride: 97 mmol/L — ABNORMAL LOW (ref 98–111)
Creatinine, Ser: 1.06 mg/dL (ref 0.61–1.24)
GFR calc Af Amer: >60 mL/min (ref >60)
GFR calc non Af Amer: >60 mL/min (ref >60)
Glucose, Bld: 95 mg/dL (ref 70–99)
Potassium: 3.7 mmol/L (ref 3.5–5.1)
Sodium: 136 mmol/L (ref 135–145)

## 2019-01-16 LAB — DIGOXIN LEVEL: Digoxin Level: <0.2 ng/mL — ABNORMAL LOW (ref 0.8–2.0)

## 2019-01-16 LAB — BRAIN NATRIURETIC PEPTIDE: B Natriuretic Peptide: 488.9 pg/mL — ABNORMAL HIGH (ref 0.0–100.0)

## 2019-01-16 MED ORDER — TORSEMIDE 20 MG PO TABS: 80.0000 mg | ORAL_TABLET | Freq: Every day | ORAL | 2 refills | Status: DC

## 2019-01-16 MED FILL — TORSEMIDE 20 MG TABLET: 20 | 90 days supply | Qty: 315 | Fill #0

## 2019-01-16 NOTE — Patient Instructions (Signed)
INCREASE Torsemide to 54m (4 tab) daily  Labs today and repeat in 2 weeks  We will only contact you if something comes back abnormal or we need to make some changes. Otherwise no news is good news!   Your physician has recommended that you have a cardiopulmonary stress test (CPX). CPX testing is a non-invasive measurement of heart and lung function. It replaces a traditional treadmill stress test. This type of test provides a tremendous amount of information that relates not only to your present condition but also for future outcomes. This test combines measurements of you ventilation, respiratory gas exchange in the lungs, electrocardiogram (EKG), blood pressure and physical response before, during, and following an exercise protocol.  Your physician recommends that you schedule a follow-up appointment in: 6 weeks with Dr MAundra Dubin  [facility] and your health needs are our priority. As part of our continuing mission to provide you with exceptional heart care, we have created designated Provider Care Teams. These Care Teams include your primary Cardiologist (physician) and Advanced Practice Providers (APPs- Physician Assistants and Nurse Practitioners) who all work together to provide you with the care you need, when you need it.   You may see any of the following providers on your designated Care Team at your next follow up: .Marland KitchenDr DGlori Bickers. Dr DLoralie Champagne. ADarrick Grinder NP . BLyda Jester PA   Please be sure to bring in all your medications bottles to every appointment.

## 2019-01-16 NOTE — Progress Notes (Signed)
Patient ID: Caleb Moment, PhD, male   DOB: 06-27-1940, 78 y.o.   MRN: [id number]    Advanced Heart Failure Clinic Note   PCP: [name] EP: [name] Cardiology: [name]  78 y.o.with complex past history presents for heart failure followup.  Patient had anterior MI in 2004 and developed ischemic cardiomyopathy as well as mitral regurgitation. He also developed atrial fibrillation.  In 10/14, he had MV repair, Maze, CABG with LIMA-LAD, and LA appendage closure at [facility] in [place].  Subsequently, atrial fibrillation returned and he had an atrial fibrillation ablation in [facility] in 3/15.  He wore a Zio patch in 12/15 and had a low atrial fibrillation burden of 7%.  He has had a long-standing ischemic cardiomyopathy.  In 2015, EF was 20-25%.  Echo in 2016 also showed EF 25-30% but estimated PA pressure suggested severe pulmonary hypertension.     At initial appointment, he reported increased exertional dyspnea over a number of weeks.  RHC was done, showing mildly elevated PCWP with moderate pulmonary HTN and low cardiac index (1.93 thermo, 2.13 Fick).  V/Q scan showed no PE.  I started him on digoxin and have titrated his Lasix to 80 mg daily.  He is now on bisoprolol and able to tolerate it.  At last appointment, I had him try replacing valsartan with Entresto 24/26 bid.  He was unable to tolerate it (made him dizzy, though BP was not low).  Therefore, I had him restart valsartan.  CPX in 4/16 showed mildly decreased functional capacity.  He has tolerated Adcirca 20 mg daily.  He did not tolerate an attempt to uptitrate bisoprolol.  He tried CPAP but was unable to tolerate it.  He was unable to tolerate eplerenone 50 mg daily.  Last echo in 9/17 showed EF 20-25%, stable MV repair, normal RV, and PA systolic pressure 86 mmHg.   He had a Lexiscan Cardiolite in 9/17 that showed infarction, no ischemia.  Pender in 10/17 showed severe mixed pulmonary venous HTN/pulmonary arterial HTN with PVR 4.3  WU and PCWP mildly elevated.  Adcirca was increased to 40 mg daily.   At a prior appointment, he was having more palpitations.  Event monitor in 11/17 showed atrial fibrillation episodes, these were symptomatic.  Since then, the atrial fibrillation seems to have decreased.  He saw [name] to discuss Tikosyn initiation.  No decision was reached at that time, and since palpitations have decreased again, he wants to hold off on Tikosyn for now.   He has OSA.  He cannot tolerate CPAP but is using his oral appliance.    HR was lower in 5/18.  He came into the office with HR around 40.  ECG showed an ectopic atrial rhythm with very small P waves. I stopped bisoprolol and later digoxin.  HR has gone up since.  He wore an event monitor in 6/18 showing rare 3-5 second pauses at night only, rare atrial fibrillation, and rare junctional bradycardia.  He wore an event monitor again in 4/19, this showed rare afib (1% total) with nocturnal bradycardia and 1 nocturnal 3 second pause.  No concerning findings.   He stopped Adcirca as his insurance was no longer covering it.    He saw [name], and it was decided that he would be unlikely to improve much with CRT with his current IVCD (not true LBBB).   Echo (7/19) is comparable to the past with mildly dilated LV, EF 25% with wall motion abnormalities,  mild RV dilation/mild decreased function, mild to moderate AI, stable repaired mitral valve, PASP 76 mmHg.     Given worsening symptoms, he had RHC in 8/19.  This showed evidence for volume overload with severe pulmonary arterial hypertension, PVR 5.8 WU and preserved cardiac output. Torsemide was increased.    He was started on Opsumit.  He says that it helped his breathing but he was unable to continue it due to considerable worsening of his peripheral edema.  He has stopped Opsumit.    After developing increased atrial fibrillation burden, I put him back on bisoprolol 2.5 mg daily.  He developed bradycardia and  presyncope, so this was stopped.  Lightheadedness resolved.  Zio patch in 6/20 off bisoprolol showed average HR 68, short runs of SVT (possible atrial fibrillation) and only nocturnal bradycardia.    Echo was done in 7/20 and reviewed, EF 15%, moderate-severe LV dilation, mild RV dilation with decreased systolic function, mild-moderate MR s/p MV repair, mild-moderate AI, PASP 40 mmHg.   He seems to be doing better today than at last appointment. Now going to pulmonary rehab.  Weight is down 3 lbs.  Walking on flat ground now without significant dyspnea.  No dyspnea with ADLs.  He is short of breath walking up a flight of stairs.  No orthopnea/PND.  No chest pain.  No palpitations.  He is in NSR today.   6 minute walk (3/16): 381 m  6 minute walk (5/16): 414.5 m 6 minute walk (10/16): 562 m 6 minute walk (1/17): 469 m 6 minute walk (8/17): 488 m 6 minute walk (6/18): 549 m 6 minute walk (11/18): 366 m 6 minute walk (9/19): 518 m 6 minute walk (9/20): 487 m  ECG (personally reviewed): Suspect NSR with low voltage Ps, IVCD with QRS 139 msec.   Labs (2/15): LDL 144 Labs (8/15): K 4.6, creatinine 0.9 Labs (12/15): HCT 42.3   Labs (2/16): K 4 => 4.2, creatinine 1.05 => 0.92, BNP 268 Labs (3/16): BNP 495 => 320, digoxin 0.4, RF 14.7 (very mild increase), TSH normal, HIV negative, anti-SCL70 negative, creatinine 0.91, K 4.4 Labs (7/16): K 5, creatinine 1.05, BNP 455, vitamin D normal, digoxin 0.4, B12 normal Labs (8/16): digoxin 0.8, BNP 305, K 4.2, creatinine 0.99 Labs (9/16): HCT 43.3, TSH normal, BNP 492 => 278, K 4.3, creatinine 0.97 => 1.04, digoxin 0.6, TSH normal, LDL 154, LDL-P 1654.  Labs (10/16): K 4.7, creatinine 1.1 Labs (1/17): pro-BNP 2065, digoxin 0.3, K 4.3, creatinine 1.10 => 1.28 Labs (3/17): K 4.1, creatinine 1.09, HCT 38.3 Labs (4/17): K 4.4, creatinine 0.99, digoxin 0.8 Labs (5/17): K 4.3, creatinine 1.08, BNP 317, hgb 13.1 Labs (8/17): K 4.6, creatinine 0.99, BNP 392,  digoxin 0.5 Labs (9/17): K 4.4, creatinine 1.05 Labs (10/17): digoxin 0.5, K 4.1, creatinine 1.12, HCT 44.9, digoxin 0.5 Labs (11/17): K 4 => 3.8, creatinine 1.3 => 1.16, digoxin 0.5, BNP 296 Labs (12/17): K 4.1, creatinine 1.15, BNP 294, digoxin 0.7 Labs (2/18): LDL 135, Lp(a) 124, LDL-P 979  Labs (5/18): K 4.3, creatinine 1.07 => 0.95, BNP 224, hgb 14.4, digoxin 0.6 Labs (6/18): K 4.4, creatinine 1.08, hgb 14 Labs (8/18): K 4.1, creatinine 1.05 Labs (12/18): K 3.7, creatinine 0.94 Labs (2/19): K 4.1, creatinine 0.99, BNP 255 Labs (5/19): K 4, creatinine 0.98, LDL 140 Labs (7/19): LDL 136 Labs (9/19): K 4.1, creatinine 0.91 Labs (12/19): K 3.6, creatinine 1.18, BNP 479, hgb 12.9 Labs (3/20): K 3.4, creatinine 1.14 Labs (5/20): K 4.1, creatinine  0.96 Labs (7/20): K 4, creatinine 1.05, BNP 407  PMH: 1. CAD: Anterior MI in 2004.  Cardiac surgery in 10/14 included LIMA-LAD.  - Lexiscan Cardiolite (9/17): EF 35%, infarct present with no ischemia.  2. Chronic mitral regurgitation: 10/14 surgery at [facility] with MV repair, Maze, LIMA-LAD, and LA appendage closure.  3. Atrial fibrillation: Paroxysmal.  He was initially on Tikosyn but had breakthrough atrial fibrillation.  H/o Maze in 2014.  Had recurrent atrial fibrillation with ablation in 3/15 by Dr Ola Spurr in [facility].  Not anticoagulated after 2 severe prostate bleeding episodes.  LA appendage was oversewn with MV surgery.  - Zio patch in 12/15 with low atrial fibrillation burden (7%).  - Holter (9/16) with PACs, PVCs, short atrial fibrillation runs (nothing sustained). - Event monitor (11/17) with runs of atrial fibrillation and flutter.   - Event monitor (6/18) with rare atrial fibrillation - Event monitor 4/19 showing rare afib (1% total) with nocturnal bradycardia and 1 nocturnal 3 second pause.  No concerning findings.  - Zio patch (6/20): Primarily NSR, average HR 68, short SVT runs (possible atrial fibrillation), occasional  nocturnal bradycardia (nothing worrisome).  4. Chronotropic incompetence.  5. HTN 6. Hyperlipidemia: Refuses statin.  7. Peripheral neuropathy 8. GERD 9. H/o BPPV 10. H/o TIA 11. Ischemic cardiomyopathy: cardiac MRI 7/14 with EF 35%, moderate MR, normal RV size and systolic function, extensive anterior and anteroseptal LGE suggestive of non-viable myocardium (this was prior to LIMA-LAD).  Echo 1/15 with EF 20-25%, moderate AI, PA systolic pressure 39 mmHg. Echo (1/16) with EF 20-25%, diffuse hypokinesis with regionality, moderate LV dilation, moderate AI, s/p MV repair with mild MR and normal gradients, RV dilated with mildly decreased systolic function, PA systolic pressure 71 mmHg.   - RHC (2/16) with mean RA 9, PA 61/25 mean 40, mean PCWP 23, CI 2.13/PVR 4.3 (Fick), CI 1.93/PVR 4.7 (thermo).   - CPX (4/16) with peak VO2 17.9, VE/VCO2 34.7 => mildly decreased functional capacity.  - Spironolactone apparently caused cognitive deficits - Intolerant of Coreg due to development of severe alopecia - Unable to uptitrate bisoprolol due to intolerance.  - Lightheaded with Entresto.  - Unable to tolerate increase in valsartan to 80 mg bid.  - Echo (1/17) with EF 30-35%, moderate LV dilation, mild AI, s/p MV repair with mild MR, moderately dilated RV with mildly decreased systolic function, PA systolic pressure 69 mmHg.  - CPX (4/17): peak VO2 18, VE/VCO2 slope 33, RER 1.28 => mild to moderate functional impairment, mildly improved.  - Echo (9/17): EF 20-25% with regional WMAs, normal RV size and systolic function, PASP 86 mmHg, stable repaired mitral valve with mild MR, moderate AI.  - RHC (10/17): mean RA 9, PA 70/23 mean 43, PCWP mean 20, CI 2.96 Fick/2.84 Thermo, PVR 4.3 WU.  - Echo (9/18): severe LV dilation, EF 20-25% with WMAs, s/p MV repair with mild MR, moderate AI, moderate TR, PASP 68 mmHg, normal RV size and systolic function.  - Echo (7/19): mildly dilated LV, EF 25% with wall motion  abnormalities, mild RV dilation/mild decreased function, mild to moderate AI, stable repaired mitral valve (no MS, mild MR), PASP 76 mmHg.   - CPX (8/19): peak VO2 18.9, VE/VCO2 slope 40, RER 1.09 => moderate HF limitation.  - RHC (8/19): mean RA 14, PA 75/28 mean 47, mean PCWP 24, CI 2.44/PVR 5 Fick, CI 2.13/PVR 5.8 thermo.  - Echo (7/20): EF 15%, moderate-severe LV dilation, mild RV dilation with decreased systolic function, mild-moderate  MR s/p MV repair, mild-moderate AI, PASP 40 mmHg.  12. Carotid stenosis: Carotid dopplers (1/16) with 40-59% bilateral ICA stenosis. Carotid dopplers (4/17) with 40-59% BICA stenosis.  - Carotid dopplers (2/18) with < 50% BICA stenosis.  - Carotid dopplers (10/20): Mild stenosis.  13. Aortic insufficiency: Mild-moderate by last echo.  14. Pulmonary HTN: Mixed PAH and pulmonary venous hypertension.  PFTs (9/14) were normal.  V/Q scan (2/16) with no evidence of acute or chronic PE.   - Unable to tolerate Opsumit due to extensive peripheral edema.  15. OSA: Moderate on 5/16 sleep study. Unable to tolerate CPAP.  Repeat sleep study at [facility] in 2/18 was also suggestive of OSA.  16. Venous insufficiency 17. Ventral hernia 18. Bradycardia: Holter (5/18) with overnight pauses up to 4.7 sec (likely due to OSA), occasional short atrial tachycardia runs, no atrial fibrillation, avg HR 70s, 3% PVCs.  - Event monitor (6/18): Rare 3-4 second pauses at night, rare atrial fibrillation, rare runs of junctional bradycardia.   SH: Married, lives in [place], Engineer, water, nonsmoker  FH: CAD  ROS: All systems reviewed and negative except as per HPI.   Current Outpatient Medications  Medication Sig Dispense Refill   aspirin EC 81 MG tablet Take 1 tablet (81 mg total) by mouth daily. 30 tablet 3   digoxin (LANOXIN) 0.125 MG tablet Take a 1/2 tablet by mouth once daily. 45 tablet 1   eplerenone (INSPRA) 25 MG tablet TAKE 1 TABLET BY MOUTH ONCE DAILY 30 tablet 11    magnesium gluconate (MAGONATE) 500 MG tablet Take 500 mg by mouth 2 (two) times daily.      Omega-3 Fatty Acids (FISH OIL PO) Take 360 mg by mouth daily.      potassium chloride (K-DUR,KLOR-CON) 20 MEQ tablet Take 2.5 tablets (50 mEq total) by mouth daily. 225 tablet 3   tadalafil, PAH, (ADCIRCA) 20 MG tablet Take 1 tablet (20 mg total) by mouth daily. (Patient taking differently: Take 10 mg by mouth daily. ) 60 tablet 6   torsemide (DEMADEX) 20 MG tablet Take 4 tablets (80 mg total) by mouth daily. Alternate 21m (3 tabs) and 868m(4 tabs) every other day 360 tablet 2   valsartan (DIOVAN) 40 MG tablet Take 4049mn the morning and 17m53m the evening 270 tablet 3   No current facility-administered medications for this encounter.    BP (!) 133/51    Pulse 75    Wt 67.8 kg (149 lb 6.4 oz)    SpO2 100%    BMI (P) 21.44 kg/m    Wt Readings from Last 3 Encounters:  01/16/19 67.8 kg (149 lb 6.4 oz)  12/27/18 68 kg (149 lb 14.6 oz)  12/16/18 69.2 kg (152 lb 9.6 oz)    General: NAD Neck: JVP 8 cm with HJR, no thyromegaly or thyroid nodule.  Lungs: Clear to auscultation bilaterally with normal respiratory effort. CV: Nondisplaced PMI.  Heart regular S1/S2, no S3/S4, 2/6 HSM LLSB/apex.  1+ edema 1/3 up lower legs.  No carotid bruit.  Normal pedal pulses.  Abdomen: Soft, nontender, no hepatosplenomegaly, no distention.  Skin: Intact without lesions or rashes.  Neurologic: Alert and oriented x 3.  Psych: Normal affect. Extremities: No clubbing or cyanosis.  HEENT: Normal.   Assessment/Plan: 1. CAD: S/p LIMA-LAD.  He has decided not to take statins after reviewing the data (I did recommend taking a statin but we have agreed to disagree on this, he is taking red yeast rice extract).  Lexiscan  Cardiolite was done in 9/17 due to atypical chest pain.  This showed prior infarction with no ischemia.  No recurrence of chest pain since that time despite ongoing exercise.   - Continue ASA 81 daily.  2.  S/p mitral valve repair: The MV repair looked stable on 7/20 echo with no MS, mild-moderate MR.  3. Aortic insufficiency: Echo in 9/20 with mild-moderate AI, stable.  4. Chronic systolic CHF: Ischemic cardiomyopathy, EF 15% with mildly decreased RV systolic function on 7/61 echo.  Most recent CPX in 8/19 with moderate HF limitation, mildly worse than prior.  He was volume overloaded on 8/19 RHC with severe pulmonary hypertension but preserved cardiac output.  NYHA class II-III symptoms, feeling better than at last appointment.  Still appears to have some volume overload though weight down 3 lbs.  - Increase torsemide to 80 mg daily, BMET today and again in 10 days.   - Off bisoprolol with bradycardia.  - Continue digoxin 0.0625 mg daily, check level today.  As long as HR does not drop significantly (has not so far), will increase digoxin to 0.125 at next appointment.     - He did not tolerate Entresto.  Continue valsartan 40 qam/80 qpm, he has not tolerated uptitration.   - Continue eplerenone 25 (unable to tolerate increase).        - He would be unlikely to benefit much from CRT, have reviewed with [name] (has IVCD, not LBBB).   - Continue pulmonary rehab.  - Still needs CPX, will arrange.  - We discussed RHC to assess filling pressure and cardiac output.  I am concerned that he may have developed low output.  He is 78 but in good health and active with no major issues other than his heart problems.  LVAD would not be out of the question, but age and prior sternotomy make it higher risk.  He says that he would not be interested in another heart surgery so will hold off on RHC for now.  5. Atrial fibrillation and flutter: s/p Maze in 10/14, then ablation in 3/15.  Prior to Maze, he was on Tikosyn but had breakthrough.  Currently, he is not anticoagulated due to history of prostate bleeding and his choice.  He did have his LA appendage oversewn at time of MV surgery.   1% atrial fibrillation on 4/19  event monitor.  Zio patch in 6/20 with short SVT runs (possible atrial fibrillation).  Today, he appears to be in NSR with low voltage P waves.   - He is off bisoprolol with bradycardia.    - We have had discussions about what to do with his atrial fibrillation/flutter.  He is symptomatic at times when in atrial fibrillation/fluttter. He had breakthrough on Tikosyn in the past, but has had Maze and atrial fibrillation ablation since that time. He opted to hold off on trying Tikosyn again and currently feels like the atrial fibrillation/flutter burden is manageable.   6. Pulmonary hypertension: Severe by echo in 9/17 and on RHC in 10/17.  RV actually looked ok on 9/17 echo.  Mixed pulmonary venous and pulmonary arterial HTN on RHC.  It is possible that the [facility] component is due to pulmonary vascular remodeling in the setting of chronic mitral regurgitation prior to MV repair. Negative V/Q scan, no evidence for CTEPH.  PFTs normal in 9/14.  He is seeing Dr Lake Bells.  Sleep study showed moderate OSA but he has been unable to tolerate CPAP and is now using an  oral device. PASP 76 mmHg by echo in 7/19.  He tried Engineer, site but stopped it so that he could take a nitric oxide supplement.  I repeated RHC in 8/19.  This showed severe mixed pulmonary venous/pulmonary arterial HTN with PVR 5.8 WU and preserved cardiac output. He was started on Opsumit but did not tolerate due to extensive peripheral edema.  - Breathing improved with Opsumit, but he was unable to tolerate due to severe peripheral edema.     - He is taking tadalafil at 10 mg daily rather than 20 mg daily or 40 mg daily (based on an alternative herbal regimen he is following). I told him that I would rather have him on tadalafil 20 mg daily but as he does not want to do this.  - We have discussed a trial of selexipag.  He will consider it in the future.   7. OSA: Moderate.  Has not tolerated CPAP. Probably plays a role in recurrent atrial fibrillation and  pulmonary hypertension as well as nocturnal sinus pauses.  He has been using his oral device. 8. Carotid stenosis: Mild on 10/20 dopplers.   9. Bradycardia: Nocturnal bradycardia in the past likely related to OSA.  Developed more ongoing bradycardia and bisoprolol and digoxin stopped.  Restarted later on bisoprolol with afib/mild RVR but became bradycardic with pre-syncope and bisoprolol stopped. I think that he has sick sinus syndrome.   I suspect he will eventually need a PPM, but currently, his HR is adequate off bisoprolol, and he has tolerated restarting low dose digoxin.   - He will stay off beta blockers.  - At next appt, will increase digoxin as long as level is ok and he is not bradycardic.   Followup in 6 wks.   Loralie Champagne 01/16/2019

## 2019-01-17 ENCOUNTER — Encounter (HOSPITAL_COMMUNITY)
Admission: RE | Admit: 2019-01-17 | Discharge: 2019-01-17 | Disposition: A | Discharge status: Home / Self Care | Payer: Medicare Other | Source: Ambulatory Visit | Attending: Cardiology | Admitting: Cardiology

## 2019-01-17 ENCOUNTER — Telehealth (HOSPITAL_COMMUNITY): Payer: Self-pay | Admitting: Pharmacy Technician

## 2019-01-17 DIAGNOSIS — I5022 Chronic systolic (congestive) heart failure: Secondary | ICD-10-CM | POA: Diagnosis not present

## 2019-01-17 NOTE — Telephone Encounter (Signed)
Oral Oncology Patient Advocate Encounter  I have filed an appeal for the prior authorization denial of Tadalafil 77m.   Appeal packet has been faxed to [phone]     This encounter will continue to be updated until final appeal determination.    ECharlann Boxer CPhT

## 2019-01-17 NOTE — Progress Notes (Signed)
Daily Session Note  Patient Details  Name: Caleb SCHNABEL, PhD MRN: [id number] Date of Birth: 06-Dec-1940 Referring Provider:     Pulmonary Rehab Walk Test from 12/27/2018 in [place]  Referring Provider  [name]      Encounter Date: 01/17/2019  Check In: Session Check In - 01/17/19 1309      Check-In   Supervising physician immediately available to respond to emergencies  [facility] immediately available    Physician(s)  Dr. Florene Glen    Location  [facility]    Staff Present  Rosebud Poles, RN, Bjorn Loser, MS, Exercise Physiologist;Jackelyn Illingworth Ysidro Evert, RN    Virtual Visit  No    Medication changes reported      No    Fall or balance concerns reported     No    Tobacco Cessation  No Change    Warm-up and Cool-down  Performed as group-led instruction    Resistance Training Performed  Yes    VAD Patient?  No    PAD/SET Patient?  No      Pain Assessment   Currently in Pain?  No/denies    Multiple Pain Sites  No       Capillary Blood Glucose: Results for orders placed or performed during the hospital encounter of 01/16/19 (from the past 24 hour(s))  Basic Metabolic Panel (BMET)     Status: Abnormal   Collection Time: 01/16/19  3:37 PM  Result Value Ref Range   Sodium 136 135 - 145 mmol/L   Potassium 3.7 3.5 - 5.1 mmol/L   Chloride 97 (L) 98 - 111 mmol/L   CO2 27 22 - 32 mmol/L   Glucose, Bld 95 70 - 99 mg/dL   BUN 17 8 - 23 mg/dL   Creatinine, Ser 1.06 0.61 - 1.24 mg/dL   Calcium 8.9 8.9 - 10.3 mg/dL   GFR calc non Af Amer >60 >60 mL/min   GFR calc Af Amer >60 >60 mL/min   Anion gap 12 5 - 15  Digoxin level     Status: Abnormal   Collection Time: 01/16/19  3:37 PM  Result Value Ref Range   Digoxin Level <0.2 (L) 0.8 - 2.0 ng/mL  B Nat Peptide     Status: Abnormal   Collection Time: 01/16/19  3:37 PM  Result Value Ref Range   B Natriuretic Peptide 488.9 (H) 0.0 - 100.0 pg/mL      Social History    Tobacco Use  Smoking Status Never Smoker  Smokeless Tobacco Never Used    Goals Met:  Exercise tolerated well No report of cardiac concerns or symptoms Strength training completed today  Goals Unmet:  Not Applicable  Comments: Service time is from 1245 to 1350    Dr. Rush Farmer is Medical Director for Pulmonary Rehab at [facility].

## 2019-01-17 NOTE — Progress Notes (Signed)
I have reviewed a Home Exercise Prescription with [name], PhD . Torri is  currently exercising at home.  The patient was advised to walk, use hand weights, and use resistance bands 2 days a week for 30-45 minutes.  Denim and I discussed how to progress their exercise prescription.  The patient stated that their goals were to increase endurance and to breathe better.  The patient stated that they understand the exercise prescription.  We reviewed exercise guidelines, target heart rate during exercise, RPE Scale, weather conditions, NTG use, endpoints for exercise, warmup and cool down.  Patient is encouraged to come to me with any questions. I will continue to follow up with the patient to assist them with progression and safety.    Hoy Register, M.S., ACSM CEP

## 2019-01-19 ENCOUNTER — Encounter (HOSPITAL_COMMUNITY)
Admission: RE | Admit: 2019-01-19 | Discharge: 2019-01-19 | Disposition: A | Discharge status: Home / Self Care | Payer: Medicare Other | Source: Ambulatory Visit | Attending: Cardiology | Admitting: Cardiology

## 2019-01-19 ENCOUNTER — Other Ambulatory Visit: Payer: Self-pay

## 2019-01-19 VITALS — Wt 149.3 lb

## 2019-01-19 DIAGNOSIS — I5022 Chronic systolic (congestive) heart failure: Secondary | ICD-10-CM | POA: Diagnosis not present

## 2019-01-19 NOTE — Progress Notes (Signed)
Daily Session Note  Patient Details  Name: Caleb ORCUTT, PhD MRN: [id number] Date of Birth: 06/26/1940 Referring Provider:     Pulmonary Rehab Walk Test from 12/27/2018 in [place]  Referring Provider  [name]      Encounter Date: 01/19/2019  Check In: Session Check In - 01/19/19 1313      Check-In   Supervising physician immediately available to respond to emergencies  [facility] immediately available    Physician(s)  Dr. Sarajane Jews    Location  [facility]    Staff Present  Rosebud Poles, RN, Bjorn Loser, MS, Exercise Physiologist;Lisa Ysidro Evert, RN    Virtual Visit  No    Medication changes reported      No    Fall or balance concerns reported     No    Tobacco Cessation  No Change    Warm-up and Cool-down  Performed as group-led instruction    Resistance Training Performed  Yes    VAD Patient?  No    PAD/SET Patient?  No      Pain Assessment   Currently in Pain?  No/denies    Multiple Pain Sites  No       Capillary Blood Glucose: No results found for this or any previous visit (from the past 24 hour(s)).    Social History   Tobacco Use  Smoking Status Never Smoker  Smokeless Tobacco Never Used    Goals Met:  Proper associated with RPD/PD & O2 Sat Exercise tolerated well Strength training completed today  Goals Unmet:  Not Applicable  Comments: Service time is from 1245  to 1350    Dr. Rush Farmer is Medical Director for Pulmonary Rehab at [facility].

## 2019-01-19 NOTE — Progress Notes (Signed)
Nutrition Note Spoke with pt today about moderate sodium intake for CHF. Reviewed the benefits of maintaining a low sodium diet. Showed pt how to read labels utilizing nutrition content claims and nutrition information.Discussed the importance of choosing low sodium products such as fresh/frozen fruits and vegetables and limiting processed meats, highly processed foods, and restaurant foods. Pt goal to gain 5-10 lbs. Pt lost weight during his last hospital stay. Discussed heart healthy, low sodium ways to increase calories. Discussd oils, avocados, nuts/nut butters. Pt has made shakes in the past and feels comfortable with this. Pt plans to incorporate high protein/high calorie shake daily for healthy weight gain.Distributed recipes and snack ideas to patient to try. Pt verbalized understanding of material discussed today. Distributed RD contact information. Michaele Offer, MS, RDN, LDN

## 2019-01-23 NOTE — Telephone Encounter (Signed)
Appeal for Tadalafil 49m daily has been approved.  Reference #: [id number] Effective Dates: 01/23/2019-03/22/2020  30 day co-pay is $49.64, 90 day co-pay is $148.08

## 2019-01-24 ENCOUNTER — Encounter (HOSPITAL_COMMUNITY): Payer: Medicare Other

## 2019-01-24 DIAGNOSIS — H353130 Nonexudative age-related macular degeneration, bilateral, stage unspecified: Secondary | ICD-10-CM | POA: Diagnosis not present

## 2019-01-24 DIAGNOSIS — H2513 Age-related nuclear cataract, bilateral: Secondary | ICD-10-CM | POA: Diagnosis not present

## 2019-01-24 DIAGNOSIS — H43813 Vitreous degeneration, bilateral: Secondary | ICD-10-CM | POA: Diagnosis not present

## 2019-01-26 ENCOUNTER — Encounter (HOSPITAL_COMMUNITY)
Admission: RE | Admit: 2019-01-26 | Discharge: 2019-01-26 | Disposition: A | Discharge status: Home / Self Care | Payer: Medicare Other | Source: Ambulatory Visit | Attending: Cardiology | Admitting: Cardiology

## 2019-01-26 ENCOUNTER — Other Ambulatory Visit: Payer: Self-pay

## 2019-01-26 DIAGNOSIS — I5022 Chronic systolic (congestive) heart failure: Secondary | ICD-10-CM | POA: Diagnosis not present

## 2019-01-26 NOTE — Progress Notes (Signed)
Daily Session Note  Patient Details  Name: Caleb COURTWRIGHT, PhD MRN: [id number] Date of Birth: 1940-10-05 Referring Provider:     Pulmonary Rehab Walk Test from 12/27/2018 in [place]  Referring Provider  [name]      Encounter Date: 01/26/2019  Check In: Session Check In - 01/26/19 1311      Check-In   Supervising physician immediately available to respond to emergencies  [facility] immediately available    Physician(s)  Dr. Tawanna Solo    Location  [facility]    Staff Present  Rosebud Poles, RN, Bjorn Loser, MS, Exercise Physiologist;Damain Broadus Ysidro Evert, RN    Virtual Visit  No    Medication changes reported      No    Fall or balance concerns reported     No    Tobacco Cessation  No Change    Warm-up and Cool-down  Performed as group-led instruction    Resistance Training Performed  Yes    VAD Patient?  No    PAD/SET Patient?  No      Pain Assessment   Currently in Pain?  No/denies    Multiple Pain Sites  No       Capillary Blood Glucose: No results found for this or any previous visit (from the past 24 hour(s)).    Social History   Tobacco Use  Smoking Status Never Smoker  Smokeless Tobacco Never Used    Goals Met:  Exercise tolerated well No report of cardiac concerns or symptoms Strength training completed today  Goals Unmet:  Not Applicable  Comments: Service time is from 1255 to 1351    Dr. Rush Farmer is Medical Director for Pulmonary Rehab at [facility].

## 2019-01-31 ENCOUNTER — Encounter (HOSPITAL_COMMUNITY)
Admission: RE | Admit: 2019-01-31 | Discharge: 2019-01-31 | Disposition: A | Discharge status: Home / Self Care | Payer: Medicare Other | Source: Ambulatory Visit | Attending: Cardiology | Admitting: Cardiology

## 2019-01-31 ENCOUNTER — Ambulatory Visit (HOSPITAL_COMMUNITY)
Admission: RE | Admit: 2019-01-31 | Discharge: 2019-01-31 | Disposition: A | Discharge status: Home / Self Care | Payer: Medicare Other | Source: Ambulatory Visit | Attending: Internal Medicine | Admitting: Internal Medicine

## 2019-01-31 ENCOUNTER — Other Ambulatory Visit: Payer: Self-pay

## 2019-01-31 DIAGNOSIS — I5022 Chronic systolic (congestive) heart failure: Secondary | ICD-10-CM

## 2019-01-31 LAB — BASIC METABOLIC PANEL
Anion gap: 11 (ref 5–15)
BUN: 20 mg/dL (ref 8–23)
CO2: 28 mmol/L (ref 22–32)
Calcium: 8.9 mg/dL (ref 8.9–10.3)
Chloride: 98 mmol/L (ref 98–111)
Creatinine, Ser: 1 mg/dL (ref 0.61–1.24)
GFR calc Af Amer: >60 mL/min (ref >60)
GFR calc non Af Amer: >60 mL/min (ref >60)
Glucose, Bld: 125 mg/dL — ABNORMAL HIGH (ref 70–99)
Potassium: 3.5 mmol/L (ref 3.5–5.1)
Sodium: 137 mmol/L (ref 135–145)

## 2019-01-31 NOTE — Progress Notes (Signed)
Daily Session Note  Patient Details  Name: Caleb GAMERO, PhD MRN: [id number] Date of Birth: Nov 04, 1940 Referring Provider:     Pulmonary Rehab Walk Test from 12/27/2018 in [place]  Referring Provider  [name]      Encounter Date: 01/31/2019  Check In: Session Check In - 01/31/19 1437      Check-In   Supervising physician immediately available to respond to emergencies  [facility] immediately available    Physician(s)  Dr. Tawanna Solo    Location  [facility]    Staff Present  Rosebud Poles, RN, Bjorn Loser, MS, Exercise Physiologist;Lisa Ysidro Evert, RN    Medication changes reported      No    Fall or balance concerns reported     No    Tobacco Cessation  No Change    Warm-up and Cool-down  Performed on first and last piece of equipment    Resistance Training Performed  Yes    VAD Patient?  No    PAD/SET Patient?  No      Pain Assessment   Currently in Pain?  No/denies    Multiple Pain Sites  No       Capillary Blood Glucose: Results for orders placed or performed during the hospital encounter of 01/31/19 (from the past 24 hour(s))  Basic metabolic panel     Status: Abnormal   Collection Time: 01/31/19 11:33 AM  Result Value Ref Range   Sodium 137 135 - 145 mmol/L   Potassium 3.5 3.5 - 5.1 mmol/L   Chloride 98 98 - 111 mmol/L   CO2 28 22 - 32 mmol/L   Glucose, Bld 125 (H) 70 - 99 mg/dL   BUN 20 8 - 23 mg/dL   Creatinine, Ser 1.00 0.61 - 1.24 mg/dL   Calcium 8.9 8.9 - 10.3 mg/dL   GFR calc non Af Amer >60 >60 mL/min   GFR calc Af Amer >60 >60 mL/min   Anion gap 11 5 - 15      Social History   Tobacco Use  Smoking Status Never Smoker  Smokeless Tobacco Never Used    Goals Met:  Independence with exercise equipment Exercise tolerated well Strength training completed today  Goals Unmet:  Not Applicable  Comments: Service time is from 1245 to [place]    Dr. Rush Farmer is Medical  Director for Pulmonary Rehab at [facility].

## 2019-02-02 ENCOUNTER — Other Ambulatory Visit: Payer: Self-pay

## 2019-02-02 ENCOUNTER — Encounter (HOSPITAL_COMMUNITY)
Admission: RE | Admit: 2019-02-02 | Discharge: 2019-02-02 | Disposition: A | Discharge status: Home / Self Care | Payer: Medicare Other | Source: Ambulatory Visit | Attending: Cardiology | Admitting: Cardiology

## 2019-02-02 DIAGNOSIS — I5022 Chronic systolic (congestive) heart failure: Secondary | ICD-10-CM | POA: Diagnosis not present

## 2019-02-02 NOTE — Progress Notes (Signed)
Daily Session Note  Patient Details  Name: Caleb W Yodice, PhD MRN: [id number] Date of Birth: 04/23/1940 Referring Provider:     Pulmonary Rehab Walk Test from 12/27/2018 in [facility]  Referring Provider  Dr. McLean      Encounter Date: 02/02/2019  Check In: Session Check In - 02/02/19 1256      Check-In   Supervising physician immediately available to respond to emergencies  [facility] immediately available    Physician(s)  Dr. Emokpae    Location  [facility]    Staff Present  Joan Behrens, RN, BSN; , MS, Exercise Physiologist    Virtual Visit  No    Medication changes reported      No    Fall or balance concerns reported     No    Tobacco Cessation  No Change    Warm-up and Cool-down  Performed on first and last piece of equipment    Resistance Training Performed  Yes    VAD Patient?  No    PAD/SET Patient?  No      Pain Assessment   Currently in Pain?  No/denies    Multiple Pain Sites  No       Capillary Blood Glucose: No results found for this or any previous visit (from the past 24 hour(s)).    Social History   Tobacco Use  Smoking Status Never Smoker  Smokeless Tobacco Never Used    Goals Met:  Independence with exercise equipment Exercise tolerated well Strength training completed today  Goals Unmet:  Not Applicable  Comments: Service time is from 1250 to 1350    Dr. Wesam G. Yacoub is Medical Director for Pulmonary Rehab at [facility]. 

## 2019-02-06 MED FILL — EPLERENONE 25 MG TABLET: 25 | 30 days supply | Qty: 30 | Fill #10

## 2019-02-07 ENCOUNTER — Other Ambulatory Visit: Payer: Self-pay

## 2019-02-07 ENCOUNTER — Encounter (HOSPITAL_COMMUNITY)
Admission: RE | Admit: 2019-02-07 | Discharge: 2019-02-07 | Disposition: A | Discharge status: Home / Self Care | Payer: Medicare Other | Source: Ambulatory Visit | Attending: Cardiology | Admitting: Cardiology

## 2019-02-07 VITALS — Wt 151.9 lb

## 2019-02-07 DIAGNOSIS — I5022 Chronic systolic (congestive) heart failure: Secondary | ICD-10-CM | POA: Diagnosis not present

## 2019-02-07 NOTE — Progress Notes (Signed)
Pulmonary Individual Treatment Plan  Patient Details  Name: Caleb BRAITHWAITE, PhD MRN: [id number] Date of Birth: February 08, 1941 Referring Provider:     Pulmonary Rehab Walk Test from 12/27/2018 in [facility]  Referring Provider  [name]      Initial Encounter Date:    Pulmonary Rehab Walk Test from 12/27/2018 in [place]  Date  12/27/18      Visit Diagnosis: Heart failure, chronic systolic ([facility])  Patient's Home Medications on Admission:   Current Outpatient Medications:  .  aspirin EC 81 MG tablet, Take 1 tablet (81 mg total) by mouth daily., Disp: 30 tablet, Rfl: 3 .  digoxin (LANOXIN) 0.125 MG tablet, Take a 1/2 tablet by mouth once daily., Disp: 45 tablet, Rfl: 1 .  eplerenone (INSPRA) 25 MG tablet, TAKE 1 TABLET BY MOUTH ONCE DAILY, Disp: 30 tablet, Rfl: 11 .  magnesium gluconate (MAGONATE) 500 MG tablet, Take 500 mg by mouth 2 (two) times daily. , Disp: , Rfl:  .  Omega-3 Fatty Acids (FISH OIL PO), Take 360 mg by mouth daily. , Disp: , Rfl:  .  potassium chloride (K-DUR,KLOR-CON) 20 MEQ tablet, Take 2.5 tablets (50 mEq total) by mouth daily., Disp: 225 tablet, Rfl: 3 .  tadalafil, PAH, (ADCIRCA) 20 MG tablet, Take 1 tablet (20 mg total) by mouth daily. (Patient taking differently: Take 10 mg by mouth daily. ), Disp: 60 tablet, Rfl: 6 .  torsemide (DEMADEX) 20 MG tablet, Take 4 tablets (80 mg total) by mouth daily. Alternate 18m (3 tabs) and 866m(4 tabs) every other day, Disp: 360 tablet, Rfl: 2 .  valsartan (DIOVAN) 40 MG tablet, Take 4063mn the morning and 39m38m the evening, Disp: 270 tablet, Rfl: 3  Past Medical History: Past Medical History:  Diagnosis Date  . Allergic rhinitis   . Atrial fibrillation (HCC)[place]. Atypical pneumonia   . CAD (coronary artery disease)   . Cardiomyopathy, ischemic   . Chronic anticoagulation   . Cough   . Dizziness   . GERD (gastroesophageal reflux disease)   . Heart  failure, systolic, acute on chronic (HCC)[place]. Hyperlipidemia   . Hypertension   . OSA (obstructive sleep apnea)    Home sleep test 07/05/2009 AHI 8.2  . Pleural effusion   . Positional vertigo   . TIA (transient ischemic attack)     Tobacco Use: Social History   Tobacco Use  Smoking Status Never Smoker  Smokeless Tobacco Never Used    Labs: Recent Review Flowsheet Data    Labs for ITP Cardiac and Pulmonary Rehab Latest Ref Rng & Units 01/10/2016 01/10/2016 08/17/2017 10/07/2017 11/16/2017   Cholestrol 0 - 200 mg/dL - - 212(H) 206(H) -   LDLCALC 0 - 99 mg/dL - - 140(H) 136(H) -   LDLDIRECT mg/dL - - - - -   HDL >40 mg/dL - - 55 53 -   Trlycerides <150 mg/dL - - 86 84 -   Hemoglobin A1c 4.8 - 5.6 % - - 5.5 - -   PHART 7.350 - 7.450 - - - - -   PCO2ART 35.0 - 45.0 mmHg - - - - -   HCO3 20.0 - 28.0 mmol/L 26.7 26.1 - - 24.8   TCO2 22 - 32 mmol/L 28 27 - - 26   ACIDBASEDEF 0.0 - 2.0 mmol/L - - - - -   O2SAT % 68.0 67.0 - - 69.0  Capillary Blood Glucose: No results found for: GLUCAP   Pulmonary Assessment Scores: Pulmonary Assessment Scores    Row Name 12/27/18 1544         ADL UCSD   ADL Phase  Entry     SOB Score total  40       CAT Score   CAT Score  15       mMRC Score   mMRC Score  3       UCSD: Self-administered rating of dyspnea associated with activities of daily living (ADLs) 6-point scale (0 = "not at all" to 5 = "maximal or unable to do because of breathlessness")  Scoring Scores range from 0 to 120.  Minimally important difference is 5 units  CAT: CAT can identify the health impairment of COPD patients and is better correlated with disease progression.  CAT has a scoring range of zero to 40. The CAT score is classified into four groups of low (less than 10), medium (10 - 20), high (21-30) and very high (31-40) based on the impact level of disease on health status. A CAT score over 10 suggests significant symptoms.  A worsening CAT score could be  explained by an exacerbation, poor medication adherence, poor inhaler technique, or progression of COPD or comorbid conditions.  CAT MCID is 2 points  mMRC: mMRC (Modified Medical Research Council) Dyspnea Scale is used to assess the degree of baseline functional disability in patients of respiratory disease due to dyspnea. No minimal important difference is established. A decrease in score of 1 point or greater is considered a positive change.   Pulmonary Function Assessment: Pulmonary Function Assessment - 12/27/18 1544      Breath   Bilateral Breath Sounds  Clear    Shortness of Breath  Yes;Limiting activity       Exercise Target Goals: Exercise Program Goal: Individual exercise prescription set using results from initial 6 min walk test and THRR while considering  patient's activity barriers and safety.   Exercise Prescription Goal: Initial exercise prescription builds to 30-45 minutes a day of aerobic activity, 2-3 days per week.  Home exercise guidelines will be given to patient during program as part of exercise prescription that the participant will acknowledge.  Activity Barriers & Risk Stratification:   6 Minute Walk: 6 Minute Walk    Row Name 11/09/18 1506 12/27/18 1559       6 Minute Walk   Phase  Discharge  Initial    Distance  1613 feet  1500 feet    Distance % Change  -1.29 %  -    Distance Feet Change  -21 ft  -    Walk Time  6 minutes  6 minutes    # of Rest Breaks  0  0    MPH  3  2.84    METS  3.5  3.14    RPE  12  14    Perceived Dyspnea   0  3    VO2 Peak  12.56  10.89    Symptoms  No  No    Resting HR  75 bpm  86 bpm    Resting BP  116/62  128/74    Resting Oxygen Saturation   -  97 %    Exercise Oxygen Saturation  during 6 min walk  -  89 %    Max Ex. HR  114 bpm  97 bpm    Max Ex. BP  148/72  132/66  2 Minute Post BP  108/62  128/72      Interval HR   1 Minute HR  -  95    2 Minute HR  -  90    3 Minute HR  -  92    4 Minute HR  -  97     5 Minute HR  -  96    6 Minute HR  -  96    2 Minute Post HR  -  115 likely in Afib    Interval Heart Rate?  -  Yes      Interval Oxygen   Interval Oxygen?  -  Yes    Baseline Oxygen Saturation %  -  97 %    1 Minute Oxygen Saturation %  -  97 %    1 Minute Liters of Oxygen  -  0 L    2 Minute Oxygen Saturation %  -  97 %    2 Minute Liters of Oxygen  -  0 L    3 Minute Oxygen Saturation %  -  89 %    3 Minute Liters of Oxygen  -  0 L    4 Minute Oxygen Saturation %  -  91 %    4 Minute Liters of Oxygen  -  0 L    5 Minute Oxygen Saturation %  -  90 %    5 Minute Liters of Oxygen  -  0 L    6 Minute Oxygen Saturation %  -  90 %    6 Minute Liters of Oxygen  -  0 L    2 Minute Post Oxygen Saturation %  -  98 %    2 Minute Post Liters of Oxygen  -  0 L       Oxygen Initial Assessment: Oxygen Initial Assessment - 12/27/18 1559      Home Oxygen   Home Oxygen Device  None    Sleep Oxygen Prescription  None    Home Exercise Oxygen Prescription  None    Home at Rest Exercise Oxygen Prescription  None    Compliance with Home Oxygen Use  Yes      Initial 6 min Walk   Oxygen Used  None      Program Oxygen Prescription   Program Oxygen Prescription  None      Intervention   Short Term Goals  To learn and exhibit compliance with exercise, home and travel O2 prescription;To learn and understand importance of monitoring SPO2 with pulse oximeter and demonstrate accurate use of the pulse oximeter.;To learn and understand importance of maintaining oxygen saturations>88%;To learn and demonstrate proper pursed lip breathing techniques or other breathing techniques.;To learn and demonstrate proper use of respiratory medications    Long  Term Goals  Exhibits compliance with exercise, home and travel O2 prescription;Verbalizes importance of monitoring SPO2 with pulse oximeter and return demonstration;Maintenance of O2 saturations>88%;Exhibits proper breathing techniques, such as pursed lip  breathing or other method taught during program session;Compliance with respiratory medication       Oxygen Re-Evaluation: Oxygen Re-Evaluation    Row Name 01/10/19 1554 02/07/19 0710           Program Oxygen Prescription   Program Oxygen Prescription  None  None        Home Oxygen   Home Oxygen Device  None  None      Sleep Oxygen Prescription  None  None  Home Exercise Oxygen Prescription  None  None      Home at Rest Exercise Oxygen Prescription  None  None      Compliance with Home Oxygen Use  Yes  Yes        Goals/Expected Outcomes   Short Term Goals  To learn and exhibit compliance with exercise, home and travel O2 prescription;To learn and understand importance of monitoring SPO2 with pulse oximeter and demonstrate accurate use of the pulse oximeter.;To learn and understand importance of maintaining oxygen saturations>88%;To learn and demonstrate proper pursed lip breathing techniques or other breathing techniques.;To learn and demonstrate proper use of respiratory medications  To learn and exhibit compliance with exercise, home and travel O2 prescription;To learn and understand importance of monitoring SPO2 with pulse oximeter and demonstrate accurate use of the pulse oximeter.;To learn and understand importance of maintaining oxygen saturations>88%;To learn and demonstrate proper pursed lip breathing techniques or other breathing techniques.;To learn and demonstrate proper use of respiratory medications      Long  Term Goals  Exhibits compliance with exercise, home and travel O2 prescription;Verbalizes importance of monitoring SPO2 with pulse oximeter and return demonstration;Maintenance of O2 saturations>88%;Exhibits proper breathing techniques, such as pursed lip breathing or other method taught during program session;Compliance with respiratory medication  Exhibits compliance with exercise, home and travel O2 prescription;Verbalizes importance of monitoring SPO2 with pulse  oximeter and return demonstration;Maintenance of O2 saturations>88%;Exhibits proper breathing techniques, such as pursed lip breathing or other method taught during program session;Compliance with respiratory medication      Goals/Expected Outcomes  compliance  compliance         Oxygen Discharge (Final Oxygen Re-Evaluation): Oxygen Re-Evaluation - 02/07/19 0710      Program Oxygen Prescription   Program Oxygen Prescription  None      Home Oxygen   Home Oxygen Device  None    Sleep Oxygen Prescription  None    Home Exercise Oxygen Prescription  None    Home at Rest Exercise Oxygen Prescription  None    Compliance with Home Oxygen Use  Yes      Goals/Expected Outcomes   Short Term Goals  To learn and exhibit compliance with exercise, home and travel O2 prescription;To learn and understand importance of monitoring SPO2 with pulse oximeter and demonstrate accurate use of the pulse oximeter.;To learn and understand importance of maintaining oxygen saturations>88%;To learn and demonstrate proper pursed lip breathing techniques or other breathing techniques.;To learn and demonstrate proper use of respiratory medications    Long  Term Goals  Exhibits compliance with exercise, home and travel O2 prescription;Verbalizes importance of monitoring SPO2 with pulse oximeter and return demonstration;Maintenance of O2 saturations>88%;Exhibits proper breathing techniques, such as pursed lip breathing or other method taught during program session;Compliance with respiratory medication    Goals/Expected Outcomes  compliance       Initial Exercise Prescription: Initial Exercise Prescription - 12/27/18 1600      Date of Initial Exercise RX and Referring Provider   Date  12/27/18    Referring Provider  [name]      Treadmill   MPH  2.2    Grade  1    Minutes  15      NuStep   Level  3    SPM  80    Minutes  15      Prescription Details   Frequency (times per week)  2    Duration  Progress  to 30 minutes of continuous aerobic without signs/symptoms  of physical distress      Intensity   THRR 40-80% of Max Heartrate  56-112    Ratings of Perceived Exertion  11-13    Perceived Dyspnea  0-4      Progression   Progression  Continue to progress workloads to maintain intensity without signs/symptoms of physical distress.      Resistance Training   Training Prescription  Yes    Weight  orange bands    Reps  10-15       Perform Capillary Blood Glucose checks as needed.  Exercise Prescription Changes: Exercise Prescription Changes    Row Name 09/26/18 1500 10/12/18 1339 11/09/18 1500 01/05/19 1512 01/17/19 1400     Response to Exercise   Blood Pressure (Admit)  124/56  122/46  116/62  132/56  -   Blood Pressure (Exercise)  130/72  142/62  148/72  118/66  -   Blood Pressure (Exit)  120/62  122/60  108/62  112/50  -   Heart Rate (Admit)  85 bpm  82 bpm  75 bpm  81 bpm  -   Heart Rate (Exercise)  102 bpm  94 bpm  114 bpm  100 bpm  -   Heart Rate (Exit)  81 bpm  86 bpm  78 bpm  75 bpm  -   Oxygen Saturation (Admit)  -  -  -  98 %  -   Oxygen Saturation (Exercise)  -  -  -  98 %  -   Oxygen Saturation (Exit)  -  -  -  99 %  -   Rating of Perceived Exertion (Exercise)  _0 -   Perceived Dyspnea (Exercise)  0  0  0  1  -   Symptoms  None  None  None  none  -   Comments  Pt's first day of exercise since closure  -  Pt graduated CR program today.   -  -   Duration  Progress to 30 minutes of  aerobic without signs/symptoms of physical distress  Continue with 30 min of aerobic exercise without signs/symptoms of physical distress.  Continue with 30 min of aerobic exercise without signs/symptoms of physical distress.  Continue with 30 min of aerobic exercise without signs/symptoms of physical distress.  -   Intensity  THRR unchanged  THRR unchanged  THRR unchanged  THRR unchanged  -     Progression   Progression  Continue to progress workloads to maintain intensity without  signs/symptoms of physical distress.  Continue to progress workloads to maintain intensity without signs/symptoms of physical distress.  Continue to progress workloads to maintain intensity without signs/symptoms of physical distress.  Continue to progress workloads to maintain intensity without signs/symptoms of physical distress.  -   Average METs  2.3  2.9  2.9  -  -     Resistance Training   Training Prescription  Yes  No Relaxation day, no weights.  No Relaxation day, no weights.  Yes  -   Weight  3lbs  -  -  orange bands  -   Reps  10-15  -  -  10-15  -   Time  10 Minutes  -  -  10 Minutes  -     Interval Training   Interval Training  -  No  No  No  -     Treadmill   MPH  -  -  -  2.2  -  Grade  -  -  -  1  -   Minutes  -  -  -  15  -     Recumbant Bike   Level  1._0 -  -   Watts  -  23  23  -  -   Minutes  _1 -  -   METs  1.9  3.05  2.3  -  -     NuStep   Level  _2 -   SPM  85  85  85  80  -   Minutes  _3 -   METs  2.7  2.7  3.4  2.7  -     Home Exercise Plan   Plans to continue exercise at  Home (comment) Walking  Home (comment) Walking  Home (comment) Walking  -  Home (comment)   Frequency  Add 3 additional days to program exercise sessions.  Add 4 additional days to program exercise sessions.  Add 4 additional days to program exercise sessions.  -  Add 2 additional days to program exercise sessions.   Initial Home Exercises Provided  06/07/18  06/07/18  06/07/18  -  01/17/19   Row Name 01/19/19 1444 02/07/19 1500           Response to Exercise   Blood Pressure (Admit)  136/62  128/60      Blood Pressure (Exercise)  140/66  122/60      Blood Pressure (Exit)  114/60  128/64      Heart Rate (Admit)  83 bpm  87 bpm      Heart Rate (Exercise)  94 bpm  97 bpm      Heart Rate (Exit)  90 bpm  86 bpm      Oxygen Saturation (Admit)  96 %  97 %      Oxygen Saturation (Exercise)  96 %  96 %      Oxygen Saturation (Exit)  98 %  97 %       Rating of Perceived Exertion (Exercise)  12  12      Perceived Dyspnea (Exercise)  2  1      Duration  Continue with 30 min of aerobic exercise without signs/symptoms of physical distress.  Continue with 30 min of aerobic exercise without signs/symptoms of physical distress.      Intensity  THRR unchanged  THRR unchanged        Progression   Progression  -  Continue to progress workloads to maintain intensity without signs/symptoms of physical distress.        Resistance Training   Training Prescription  Yes  Yes      Weight  orange bands  orange bands      Reps  10-15  10-15      Time  10 Minutes  10 Minutes        Treadmill   MPH  2.4  2.4      Grade  1  3      Minutes  15  15        NuStep   Level  3  4      SPM  80  80      Minutes  15  15      METs  2.8  3  Exercise Comments: Exercise Comments    Row Name 09/26/18 1528 10/12/18 1400 11/09/18 1519 01/17/19 1436     Exercise Comments  Pt's first day of exercise since departmental closure due to COVID 19. Pt tolerated exercise well. Will continue to monitor pt's progress.  Reviewed goals with patient. Pt is walking and doing resistance training at home in addition to exercise at cardiac rehab.  Pt graduated CR program today. Pt will continue walking at home for exercise.  home exercise complete       Exercise Goals and Review: Exercise Goals    Row Name 12/27/18 1606 01/10/19 1555 02/07/19 0711         Exercise Goals   Increase Physical Activity  Yes  Yes  Yes     Intervention  Provide advice, education, support and counseling about physical activity/exercise needs.;Develop an individualized exercise prescription for aerobic and resistive training based on initial evaluation findings, risk stratification, comorbidities and participant's personal goals.  Provide advice, education, support and counseling about physical activity/exercise needs.;Develop an individualized exercise prescription for aerobic and  resistive training based on initial evaluation findings, risk stratification, comorbidities and participant's personal goals.  Provide advice, education, support and counseling about physical activity/exercise needs.;Develop an individualized exercise prescription for aerobic and resistive training based on initial evaluation findings, risk stratification, comorbidities and participant's personal goals.     Expected Outcomes  Short Term: Attend rehab on a regular basis to increase amount of physical activity.;Long Term: Exercising regularly at least 3-5 days a week.;Long Term: Add in home exercise to make exercise part of routine and to increase amount of physical activity.  Short Term: Attend rehab on a regular basis to increase amount of physical activity.;Long Term: Exercising regularly at least 3-5 days a week.;Long Term: Add in home exercise to make exercise part of routine and to increase amount of physical activity.  Short Term: Attend rehab on a regular basis to increase amount of physical activity.;Long Term: Exercising regularly at least 3-5 days a week.;Long Term: Add in home exercise to make exercise part of routine and to increase amount of physical activity.     Increase Strength and Stamina  Yes  Yes  Yes     Intervention  Provide advice, education, support and counseling about physical activity/exercise needs.;Develop an individualized exercise prescription for aerobic and resistive training based on initial evaluation findings, risk stratification, comorbidities and participant's personal goals.  Provide advice, education, support and counseling about physical activity/exercise needs.;Develop an individualized exercise prescription for aerobic and resistive training based on initial evaluation findings, risk stratification, comorbidities and participant's personal goals.  Provide advice, education, support and counseling about physical activity/exercise needs.;Develop an individualized exercise  prescription for aerobic and resistive training based on initial evaluation findings, risk stratification, comorbidities and participant's personal goals.     Expected Outcomes  Short Term: Increase workloads from initial exercise prescription for resistance, speed, and METs.;Short Term: Perform resistance training exercises routinely during rehab and add in resistance training at home;Long Term: Improve cardiorespiratory fitness, muscular endurance and strength as measured by increased METs and functional capacity (6MWT)  Short Term: Increase workloads from initial exercise prescription for resistance, speed, and METs.;Short Term: Perform resistance training exercises routinely during rehab and add in resistance training at home;Long Term: Improve cardiorespiratory fitness, muscular endurance and strength as measured by increased METs and functional capacity (6MWT)  Short Term: Increase workloads from initial exercise prescription for resistance, speed, and METs.;Short Term: Perform resistance training exercises routinely during rehab and  add in resistance training at home;Long Term: Improve cardiorespiratory fitness, muscular endurance and strength as measured by increased METs and functional capacity (6MWT)     Able to understand and use rate of perceived exertion (RPE) scale  Yes  Yes  Yes     Intervention  Provide education and explanation on how to use RPE scale  Provide education and explanation on how to use RPE scale  Provide education and explanation on how to use RPE scale     Expected Outcomes  Short Term: Able to use RPE daily in rehab to express subjective intensity level;Long Term:  Able to use RPE to guide intensity level when exercising independently  Short Term: Able to use RPE daily in rehab to express subjective intensity level;Long Term:  Able to use RPE to guide intensity level when exercising independently  Short Term: Able to use RPE daily in rehab to express subjective intensity  level;Long Term:  Able to use RPE to guide intensity level when exercising independently     Able to understand and use Dyspnea scale  Yes  Yes  Yes     Intervention  Provide education and explanation on how to use Dyspnea scale  Provide education and explanation on how to use Dyspnea scale  Provide education and explanation on how to use Dyspnea scale     Expected Outcomes  Short Term: Able to use Dyspnea scale daily in rehab to express subjective sense of shortness of breath during exertion;Long Term: Able to use Dyspnea scale to guide intensity level when exercising independently  Short Term: Able to use Dyspnea scale daily in rehab to express subjective sense of shortness of breath during exertion;Long Term: Able to use Dyspnea scale to guide intensity level when exercising independently  Short Term: Able to use Dyspnea scale daily in rehab to express subjective sense of shortness of breath during exertion;Long Term: Able to use Dyspnea scale to guide intensity level when exercising independently     Knowledge and understanding of Target Heart Rate Range (THRR)  Yes  Yes  Yes     Intervention  Provide education and explanation of THRR including how the numbers were predicted and where they are located for reference  Provide education and explanation of THRR including how the numbers were predicted and where they are located for reference  Provide education and explanation of THRR including how the numbers were predicted and where they are located for reference     Expected Outcomes  Long Term: Able to use THRR to govern intensity when exercising independently;Short Term: Able to state/look up THRR;Short Term: Able to use daily as guideline for intensity in rehab  Long Term: Able to use THRR to govern intensity when exercising independently;Short Term: Able to state/look up THRR;Short Term: Able to use daily as guideline for intensity in rehab  Long Term: Able to use THRR to govern intensity when exercising  independently;Short Term: Able to state/look up THRR;Short Term: Able to use daily as guideline for intensity in rehab     Understanding of Exercise Prescription  Yes  Yes  Yes     Intervention  Provide education, explanation, and written materials on patient's individual exercise prescription  Provide education, explanation, and written materials on patient's individual exercise prescription  Provide education, explanation, and written materials on patient's individual exercise prescription     Expected Outcomes  Short Term: Able to explain program exercise prescription;Long Term: Able to explain home exercise prescription to exercise independently  Short Term:  Able to explain program exercise prescription;Long Term: Able to explain home exercise prescription to exercise independently  Short Term: Able to explain program exercise prescription;Long Term: Able to explain home exercise prescription to exercise independently        Exercise Goals Re-Evaluation : Exercise Goals Re-Evaluation    Row Name 10/12/18 1400 11/09/18 1517 01/10/19 1555 02/07/19 0711       Exercise Goal Re-Evaluation   Exercise Goals Review  Increase Physical Activity;Able to understand and use rate of perceived exertion (RPE) scale;Knowledge and understanding of Target Heart Rate Range (THRR);Understanding of Exercise Prescription;Increase Strength and Stamina  Increase Physical Activity;Understanding of Exercise Prescription;Increase Strength and Stamina;Knowledge and understanding of Target Heart Rate Range (THRR);Able to understand and use rate of perceived exertion (RPE) scale;Able to check pulse independently  Increase Physical Activity;Increase Strength and Stamina;Able to understand and use rate of perceived exertion (RPE) scale;Able to understand and use Dyspnea scale;Knowledge and understanding of Target Heart Rate Range (THRR);Understanding of Exercise Prescription  Increase Physical Activity;Increase Strength and  Stamina;Able to understand and use rate of perceived exertion (RPE) scale;Able to understand and use Dyspnea scale;Knowledge and understanding of Target Heart Rate Range (THRR);Understanding of Exercise Prescription    Comments  Patient is walking at least 20-30 minutes 5-7 days/week. Pt also has 10lb weights at home that he uses for his resistance training.  Pt graduated CR program today. Pt tolerated exercise prescritptions well and increased resistance on all exercises gradually through program. Pt is walking 30 minutes poer day weather permitting and plans to continue to do so. Pt understands goals going forward with graduating.  Pt has completed 2 exercise session. Pt had previously finished cardiac rehab a couple of months prior. Pt is motivated to increase his workloads. Pt exercises at 2.7 METs on the stepper. Will continue to monitor and progress as able.  Pt has completed 7 exercise sessions. Pt currently exercises at 2.7 METs on the stepper. Pt has been able to increase his workloads on both the stepper and treadmill. Will continue to monitor and progress as able.    Expected Outcomes  Increase workloads as tolerated to help increase strength and stamina.  Pt will continue exercising at home after graduating.  Through exercise at rehab and at home, the patient will decrease shortness of breath with daily activities and feel confident in carrying out an exercise regime at home.  Through exercise at rehab and at home, the patient will decrease shortness of breath with daily activities and feel confident in carrying out an exercise regime at home.       Discharge Exercise Prescription (Final Exercise Prescription Changes): Exercise Prescription Changes - 02/07/19 1500      Response to Exercise   Blood Pressure (Admit)  128/60    Blood Pressure (Exercise)  122/60    Blood Pressure (Exit)  128/64    Heart Rate (Admit)  87 bpm    Heart Rate (Exercise)  97 bpm    Heart Rate (Exit)  86 bpm     Oxygen Saturation (Admit)  97 %    Oxygen Saturation (Exercise)  96 %    Oxygen Saturation (Exit)  97 %    Rating of Perceived Exertion (Exercise)  12    Perceived Dyspnea (Exercise)  1    Duration  Continue with 30 min of aerobic exercise without signs/symptoms of physical distress.    Intensity  THRR unchanged      Progression   Progression  Continue to progress workloads to  maintain intensity without signs/symptoms of physical distress.      Resistance Training   Training Prescription  Yes    Weight  orange bands    Reps  10-15    Time  10 Minutes      Treadmill   MPH  2.4    Grade  3    Minutes  15      NuStep   Level  4    SPM  80    Minutes  15    METs  3       Nutrition:  Target Goals: Understanding of nutrition guidelines, daily intake of sodium <1511m, cholesterol <2044m calories 30% from fat and 7% or less from saturated fats, daily to have 5 or more servings of fruits and vegetables.  Biometrics: Pre Biometrics - 12/27/18 1519      Pre Biometrics   Height  _0  (1.778 m)  (Pended)       Post Biometrics - 11/09/18 1509       Post  Biometrics   Height  _1  (1.753 m)    Weight  68.4 kg    Waist Circumference  32 inches    Hip Circumference  37.5 inches    Waist to Hip Ratio  0.85 %    BMI (Calculated)  22.26    Triceps Skinfold  18 mm    % Body Fat  22.9 %    Grip Strength  40 kg    Flexibility  0 in    Single Leg Stand  3.18 seconds       Nutrition Therapy Plan and Nutrition Goals:   Nutrition Assessments:   Nutrition Goals Re-Evaluation:   Nutrition Goals Discharge (Final Nutrition Goals Re-Evaluation):   Psychosocial: Target Goals: Acknowledge presence or absence of significant depression and/or stress, maximize coping skills, provide positive support system. Participant is able to verbalize types and ability to use techniques and skills needed for reducing stress and depression.  Initial Review & Psychosocial  Screening: Initial Psych Review & Screening - 12/27/18 1545      Initial Review   Current issues with  Current Stress Concerns    Source of Stress Concerns  Chronic Illness    Comments  ejection fraction 10-15% and patient is struggling with end of life issues.      Family Dynamics   Good Support System?  Yes      Barriers   Psychosocial barriers to participate in program  There are no identifiable barriers or psychosocial needs.   patient is handling end of life issues well, considering counselling     Screening Interventions   Interventions  Encouraged to exercise   suggested counselling tp patient for him and spouse      Quality of Life Scores: Quality of Life - 11/09/18 1529      Quality of Life   Select  Quality of Life      Quality of Life Scores   Health/Function Pre  18.2 %    Socioeconomic Pre  27.33 %    Psych/Spiritual Pre  24.75 %    Family Pre  24 %    GLOBAL Pre  22.05 %      Scores of 19 and below usually indicate a poorer quality of life in these areas.  A difference of  2-3 points is a clinically meaningful difference.  A difference of 2-3 points in the total score of the Quality of Life Index has been associated with significant improvement  in overall quality of life, self-image, physical symptoms, and general health in studies assessing change in quality of life.  PHQ-9: Recent Review Flowsheet Data    Depression screen [facility] 2/9 12/27/2018 11/09/2018 09/27/2018 03/29/2018 01/19/2018   Decreased Interest 0 0 0 0 0   Down, Depressed, Hopeless 0 0 0 0 0   PHQ - 2 Score 0 0 0 0 0   Altered sleeping 2 - - - -   Tired, decreased energy 3 - - - -   Change in appetite 1 - - - -   Trouble concentrating 0 - - - -   Moving slowly or fidgety/restless 0 - - - -   Suicidal thoughts 0 - - - -   Difficult doing work/chores Not difficult at all - - - -     Interpretation of Total Score  Total Score Depression Severity:  1-4 = Minimal depression, 5-9 = Mild depression,  10-14 = Moderate depression, 15-19 = Moderately severe depression, 20-27 = Severe depression   Psychosocial Evaluation and Intervention: Psychosocial Evaluation - 12/27/18 1547      Psychosocial Evaluation & Interventions   Interventions  Encouraged to exercise with the program and follow exercise prescription;Stress management education    Comments  Struggling with end of life questions and decisions    Expected Outcomes  Develop healthy coping mechanisms to deal with stress    Continue Psychosocial Services   Follow up required by staff       Psychosocial Re-Evaluation: Psychosocial Re-Evaluation    Row Name 10/13/18 1555 12/27/18 1550 01/11/19 1211 02/06/19 1140       Psychosocial Re-Evaluation   Current issues with  None Identified  Current Stress Concerns  Current Stress Concerns  Current Stress Concerns    Comments  no psychosocial needs identified, no interventions necessary  -  Patient is dealing with new decrease in ejection fraction to 10-15%, have discussed seeing a counsellor for he and his wife to work out end of life issues  Caleb [name] has a positive outlook despite his severe heart failure.  His wife is to have foot surgury in the near future and he will be the sole caregiver.    Expected Outcomes  pt will exhibit positive outlook with good coping skills.   -  -  For Caleb [name] to use healthy coping mechanisms for his stressors in his life.    Interventions  Encouraged to attend Cardiac Rehabilitation for the exercise  -  Stress management education;Encouraged to attend Pulmonary Rehabilitation for the exercise;Relaxation education  Stress management education;Encouraged to attend Pulmonary Rehabilitation for the exercise    Continue Psychosocial Services   No Follow up required  -  Follow up required by staff  No Follow up required    Comments  -  -  Is coping well at this time, but patient's wife is not with patient's end of life issues  Handling stressors well.      Initial Review    Source of Stress Concerns  -  -  Chronic Illness;Unable to participate in former interests or hobbies;Unable to perform yard/household activities  Chronic Illness;Family       Psychosocial Discharge (Final Psychosocial Re-Evaluation): Psychosocial Re-Evaluation - 02/06/19 1140      Psychosocial Re-Evaluation   Current issues with  Current Stress Concerns    Comments  Caleb [name] has a positive outlook despite his severe heart failure.  His wife is to have foot surgury in the near future and he will be the  sole caregiver.    Expected Outcomes  For Caleb [name] to use healthy coping mechanisms for his stressors in his life.    Interventions  Stress management education;Encouraged to attend Pulmonary Rehabilitation for the exercise    Continue Psychosocial Services   No Follow up required    Comments  Handling stressors well.      Initial Review   Source of Stress Concerns  Chronic Illness;Family       Education: Education Goals: Education classes will be provided on a weekly basis, covering required topics. Participant will state understanding/return demonstration of topics presented.  Learning Barriers/Preferences: Learning Barriers/Preferences - 12/27/18 1550      Learning Barriers/Preferences   Learning Barriers  None    Learning Preferences  Audio;Computer/Internet;Group Instruction;Individual Instruction;Pictoral;Skilled Demonstration;Verbal Instruction;Video;Written Material       Education Topics: Risk Factor Reduction:  -Group instruction that is supported by a PowerPoint presentation. Instructor discusses the definition of a risk factor, different risk factors for pulmonary disease, and how the heart and lungs work together.     Nutrition for Pulmonary Patient:  -Group instruction provided by PowerPoint slides, verbal discussion, and written materials to support subject matter. The instructor gives an explanation and review of healthy diet recommendations, which includes a  discussion on weight management, recommendations for fruit and vegetable consumption, as well as protein, fluid, caffeine, fiber, sodium, sugar, and alcohol. Tips for eating when patients are short of breath are discussed.   Pursed Lip Breathing:  -Group instruction that is supported by demonstration and informational handouts. Instructor discusses the benefits of pursed lip and diaphragmatic breathing and detailed demonstration on how to preform both.     Oxygen Safety:  -Group instruction provided by PowerPoint, verbal discussion, and written material to support subject matter. There is an overview of "What is Oxygen" and "Why do we need it".  Instructor also reviews how to create a safe environment for oxygen use, the importance of using oxygen as prescribed, and the risks of noncompliance. There is a brief discussion on traveling with oxygen and resources the patient may utilize.   Oxygen Equipment:  -Group instruction provided by [facility] Staff utilizing handouts, written materials, and equipment demonstrations.   Signs and Symptoms:  -Group instruction provided by written material and verbal discussion to support subject matter. Warning signs and symptoms of infection, stroke, and heart attack are reviewed and when to call the physician/911 reinforced. Tips for preventing the spread of infection discussed.   Advanced Directives:  -Group instruction provided by verbal instruction and written material to support subject matter. Instructor reviews Advanced Directive laws and proper instruction for filling out document.   Pulmonary Video:  -Group video education that reviews the importance of medication and oxygen compliance, exercise, good nutrition, pulmonary hygiene, and pursed lip and diaphragmatic breathing for the pulmonary patient.   Exercise for the Pulmonary Patient:  -Group instruction that is supported by a PowerPoint presentation. Instructor discusses benefits of  exercise, core components of exercise, frequency, duration, and intensity of an exercise routine, importance of utilizing pulse oximetry during exercise, safety while exercising, and options of places to exercise outside of rehab.     Pulmonary Medications:  -Verbally interactive group education provided by instructor with focus on inhaled medications and proper administration.   Anatomy and Physiology of the Respiratory System and Intimacy:  -Group instruction provided by PowerPoint, verbal discussion, and written material to support subject matter. Instructor reviews respiratory cycle and anatomical components of the respiratory system and their functions.  Instructor also reviews differences in obstructive and restrictive respiratory diseases with examples of each. Intimacy, Sex, and Sexuality differences are reviewed with a discussion on how relationships can change when diagnosed with pulmonary disease. Common sexual concerns are reviewed.   MD DAY -A group question and answer session with a medical doctor that allows participants to ask questions that relate to their pulmonary disease state.   OTHER EDUCATION -Group or individual verbal, written, or video instructions that support the educational goals of the pulmonary rehab program.   Holiday Eating Survival Tips:  -Group instruction provided by PowerPoint slides, verbal discussion, and written materials to support subject matter. The instructor gives patients tips, tricks, and techniques to help them not only survive but enjoy the holidays despite the onslaught of food that accompanies the holidays.   PULMONARY REHAB OTHER RESPIRATORY from 02/07/2019 in [place]  Date  02/07/19  Educator  -- [Handout]      Knowledge Questionnaire Score: Knowledge Questionnaire Score - 12/27/18 1551      Knowledge Questionnaire Score   Pre Score  14/18       Core Components/Risk Factors/Patient Goals at  Admission: Personal Goals and Risk Factors at Admission - 12/27/18 1551      Core Components/Risk Factors/Patient Goals on Admission   Improve shortness of breath with ADL's  Yes    Heart Failure  Yes    Intervention  --   monitor weights and medication compliance   Expected Outcomes  Improve functional capacity of life;Short term: Attendance in program 2-3 days a week with increased exercise capacity. Reported lower sodium intake. Reported increased fruit and vegetable intake. Reports medication compliance.;Short term: Daily weights obtained and reported for increase. Utilizing diuretic protocols set by physician.;Long term: Adoption of self-care skills and reduction of barriers for early signs and symptoms recognition and intervention leading to self-care maintenance.    Stress  Yes    Intervention  Offer individual and/or small group education and counseling on adjustment to heart disease, stress management and health-related lifestyle change. Teach and support self-help strategies.;Refer participants experiencing significant psychosocial distress to appropriate mental health specialists for further evaluation and treatment. When possible, include family members and significant others in education/counseling sessions.    Expected Outcomes  Short Term: Participant demonstrates changes in health-related behavior, relaxation and other stress management skills, ability to obtain effective social support, and compliance with psychotropic medications if prescribed.;Long Term: Emotional wellbeing is indicated by absence of clinically significant psychosocial distress or social isolation.       Core Components/Risk Factors/Patient Goals Review:  Goals and Risk Factor Review    Row Name 10/13/18 1555 11/10/18 1525 12/27/18 1552 01/11/19 1214 02/06/19 1145     Core Components/Risk Factors/Patient Goals Review   Personal Goals Review  Improve shortness of breath with ADL's;Hypertension;Lipids;Stress   Improve shortness of breath with ADL's;Hypertension;Lipids;Stress  Stress;Improve shortness of breath with ADL's;Heart Failure;Develop more efficient breathing techniques such as purse lipped breathing and diaphragmatic breathing and practicing self-pacing with activity.  Heart Failure;Improve shortness of breath with ADL's;Develop more efficient breathing techniques such as purse lipped breathing and diaphragmatic breathing and practicing self-pacing with activity.  Heart Failure;Develop more efficient breathing techniques such as purse lipped breathing and diaphragmatic breathing and practicing self-pacing with activity.;Improve shortness of breath with ADL's   Review  Caleb [name] has been doing well with exercise since cardiac rehab restarted. Caleb [name] vital signs have been stable  Caleb [name] has been doing well with exercise since cardiac rehab  restarted. Caleb [name] vital signs have been stable. Caleb [name] graduated on 11/10/18 and plans to continue exercising by walking weather permitting.  -  Just started program, too early to see progression towards goals  Heart failure is stable, he is a pleasure to have in the program, exercising on level 4 of the nustep and 2.4 mph and 2% incline on the treadmill.   Expected Outcomes  pt will participate in exercise, nutrition and lifestyle modification to decrease disease progress and improved quality of life  Patient will continue to exercise upon discharge from cardiac rehab continue to follow nutrition and lifestyle modifications.  -  See admission goals  See admission goals.      Core Components/Risk Factors/Patient Goals at Discharge (Final Review):  Goals and Risk Factor Review - 02/06/19 1145      Core Components/Risk Factors/Patient Goals Review   Personal Goals Review  Heart Failure;Develop more efficient breathing techniques such as purse lipped breathing and diaphragmatic breathing and practicing self-pacing with activity.;Improve shortness of breath with ADL's     Review  Heart failure is stable, he is a pleasure to have in the program, exercising on level 4 of the nustep and 2.4 mph and 2% incline on the treadmill.    Expected Outcomes  See admission goals.       ITP Comments: ITP Comments    Row Name 10/13/18 1600           ITP Comments  30 Day ITP Review. Patient has been with good participation and attendance since cardiac rehab resumed          Comments: ITP REVIEW Pt is making expected progress toward pulmonary rehab goals after completing 8 sessions. Recommend continued exercise, life style modification, education, and utilization of breathing techniques to increase stamina and strength and decrease shortness of breath with exertion.

## 2019-02-07 NOTE — Progress Notes (Signed)
Daily Session Note  Patient Details  Name: Caleb BRENTLINGER, PhD MRN: [id number] Date of Birth: 1940-05-22 Referring Provider:     Pulmonary Rehab Walk Test from 12/27/2018 in [place]  Referring Provider  [name]      Encounter Date: 02/07/2019  Check In: Session Check In - 02/07/19 [place]      Check-In   Supervising physician immediately available to respond to emergencies  [facility] immediately available    Physician(s)  Dr. Doristine Bosworth    Location  [facility]    Staff Present  Rosebud Poles, RN, Bjorn Loser, MS, Exercise Physiologist;Caelen Higinbotham Ysidro Evert, RN    Virtual Visit  No    Medication changes reported      No    Fall or balance concerns reported     No    Tobacco Cessation  No Change    Warm-up and Cool-down  Performed as group-led instruction    Resistance Training Performed  Yes    VAD Patient?  No    PAD/SET Patient?  No      Pain Assessment   Currently in Pain?  No/denies    Multiple Pain Sites  No       Capillary Blood Glucose: No results found for this or any previous visit (from the past 24 hour(s)).  Exercise Prescription Changes - 02/07/19 1500      Response to Exercise   Blood Pressure (Admit)  128/60    Blood Pressure (Exercise)  122/60    Blood Pressure (Exit)  128/64    Heart Rate (Admit)  87 bpm    Heart Rate (Exercise)  97 bpm    Heart Rate (Exit)  86 bpm    Oxygen Saturation (Admit)  97 %    Oxygen Saturation (Exercise)  96 %    Oxygen Saturation (Exit)  97 %    Rating of Perceived Exertion (Exercise)  12    Perceived Dyspnea (Exercise)  1    Duration  Continue with 30 min of aerobic exercise without signs/symptoms of physical distress.    Intensity  THRR unchanged      Progression   Progression  Continue to progress workloads to maintain intensity without signs/symptoms of physical distress.      Resistance Training   Training Prescription  Yes    Weight  orange bands    Reps   10-15    Time  10 Minutes      Treadmill   MPH  2.4    Grade  3    Minutes  15      NuStep   Level  4    SPM  80    Minutes  15    METs  3       Social History   Tobacco Use  Smoking Status Never Smoker  Smokeless Tobacco Never Used    Goals Met:  Exercise tolerated well No report of cardiac concerns or symptoms Strength training completed today  Goals Unmet:  Not Applicable  Comments: Service time is from 1245 to 1343    Dr. Rush Farmer is Medical Director for Pulmonary Rehab at [facility].

## 2019-02-09 ENCOUNTER — Encounter (HOSPITAL_COMMUNITY): Payer: Medicare Other

## 2019-02-14 ENCOUNTER — Encounter (HOSPITAL_COMMUNITY): Payer: Medicare Other

## 2019-02-20 ENCOUNTER — Encounter (HOSPITAL_COMMUNITY): Payer: Medicare Other

## 2019-02-20 MED FILL — POTASSIUM CHLORIDE CRYS ER: 20 MEQ | 90 days supply | Qty: 225 | Fill #1

## 2019-02-21 ENCOUNTER — Other Ambulatory Visit: Payer: Self-pay

## 2019-02-21 ENCOUNTER — Encounter (HOSPITAL_COMMUNITY)
Admission: RE | Admit: 2019-02-21 | Discharge: 2019-02-21 | Disposition: A | Discharge status: Home / Self Care | Payer: Medicare Other | Source: Ambulatory Visit | Attending: Cardiology | Admitting: Cardiology

## 2019-02-21 VITALS — Wt 151.5 lb

## 2019-02-21 DIAGNOSIS — I5022 Chronic systolic (congestive) heart failure: Secondary | ICD-10-CM | POA: Diagnosis present

## 2019-02-21 NOTE — Progress Notes (Signed)
Daily Session Note  Patient Details  Name: Caleb HABENICHT, PhD MRN: [id number] Date of Birth: 1940/12/31 Referring Provider:     Pulmonary Rehab Walk Test from 12/27/2018 in [place]  Referring Provider  [name]      Encounter Date: 02/21/2019  Check In: Session Check In - 02/21/19 1334      Check-In   Supervising physician immediately available to respond to emergencies  [facility] immediately available    Physician(s)  Dr. Eliseo Squires    Location  [facility]    Staff Present  Rosebud Poles, RN, Bjorn Loser, MS, Exercise Physiologist;Shauni Henner Ysidro Evert, RN    Virtual Visit  No    Medication changes reported      No    Fall or balance concerns reported     No    Tobacco Cessation  No Change    Warm-up and Cool-down  Performed on first and last piece of equipment    Resistance Training Performed  Yes    VAD Patient?  No    PAD/SET Patient?  No      Pain Assessment   Currently in Pain?  No/denies    Multiple Pain Sites  No       Capillary Blood Glucose: No results found for this or any previous visit (from the past 24 hour(s)).  Exercise Prescription Changes - 02/21/19 1400      Response to Exercise   Blood Pressure (Admit)  134/60    Blood Pressure (Exercise)  144/70    Blood Pressure (Exit)  110/58    Heart Rate (Admit)  83 bpm    Heart Rate (Exercise)  99 bpm    Heart Rate (Exit)  85 bpm    Oxygen Saturation (Admit)  100 %    Oxygen Saturation (Exercise)  98 %    Oxygen Saturation (Exit)  98 %    Rating of Perceived Exertion (Exercise)  13    Perceived Dyspnea (Exercise)  2    Duration  Continue with 30 min of aerobic exercise without signs/symptoms of physical distress.    Intensity  THRR unchanged      Progression   Progression  Continue to progress workloads to maintain intensity without signs/symptoms of physical distress.      Resistance Training   Training Prescription  Yes    Weight  orange bands     Reps  10-15    Time  10 Minutes      Treadmill   MPH  2.4    Grade  3    Minutes  1   elevation decreased due to no exercise recently     NuStep   Level  4    SPM  80    Minutes  15    METs  2.6       Social History   Tobacco Use  Smoking Status Never Smoker  Smokeless Tobacco Never Used    Goals Met:  Exercise tolerated well No report of cardiac concerns or symptoms Strength training completed today  Goals Unmet:  Not Applicable  Comments: Service time is from 1255 to 81    Dr. Rush Farmer is Medical Director for Pulmonary Rehab at [facility].

## 2019-02-23 ENCOUNTER — Encounter (HOSPITAL_COMMUNITY): Payer: Medicare Other

## 2019-02-24 ENCOUNTER — Other Ambulatory Visit (HOSPITAL_COMMUNITY): Payer: Medicare Other

## 2019-02-27 ENCOUNTER — Encounter (HOSPITAL_COMMUNITY): Payer: Medicare Other

## 2019-02-27 ENCOUNTER — Telehealth (HOSPITAL_COMMUNITY): Payer: Self-pay

## 2019-02-27 NOTE — Telephone Encounter (Signed)
Left message to return call 

## 2019-02-27 NOTE — Telephone Encounter (Signed)
Take torsemide 80 qam/40 qpm until his appt.

## 2019-02-27 NOTE — Telephone Encounter (Signed)
Pt called to report increasing fluid.  Pt reports some shortness of breath, edema and weight increase. Pt notes he adjusted his torsemide to 134m today and have not seen increase in urine output.  He does have an appt on Thursday which he was unaware of.  Is there something more he can take to assist with getting fluid off.

## 2019-02-28 ENCOUNTER — Encounter (HOSPITAL_COMMUNITY): Payer: Medicare Other

## 2019-02-28 NOTE — Telephone Encounter (Signed)
Pt aware of MD recommendations to take torsemide 22m in the morning and 436min the evening. Pt verbalized understanding.  F/u appointment 12/10.

## 2019-03-01 ENCOUNTER — Telehealth (HOSPITAL_COMMUNITY): Payer: Self-pay

## 2019-03-01 MED ORDER — METOLAZONE 2.5 MG PO TABS: 2.5000 mg | ORAL_TABLET | Freq: Every day | ORAL | 0 refills | Status: DC

## 2019-03-01 MED FILL — DIGOXIN 0.125 MG TABLET: 125 | 90 days supply | Qty: 45 | Fill #1

## 2019-03-01 MED FILL — metOLazone 2.5 MG TABS: 2.5 | 5 days supply | Qty: 5 | Fill #0

## 2019-03-01 NOTE — Telephone Encounter (Signed)
Pt called to report that his lower extremities are extremely swollen and he couldn't wait for his appt tomorrow with Dr Aundra Dubin.  His legs are tight and uncomfortable.  No available openings today, advised patient to go ED. Pt refused.  D/w Dr Aundra Dubin, advised to take metolazone 2.84m 1 tab with additional 20 KCL today.  If no improvement of symptoms, pt to present to ED.  Pt verbalized understanding and appreciation.

## 2019-03-02 ENCOUNTER — Other Ambulatory Visit: Payer: Self-pay

## 2019-03-02 ENCOUNTER — Encounter (HOSPITAL_COMMUNITY): Payer: Self-pay | Admitting: Cardiology

## 2019-03-02 ENCOUNTER — Encounter (HOSPITAL_COMMUNITY): Payer: Medicare Other

## 2019-03-02 ENCOUNTER — Telehealth (HOSPITAL_COMMUNITY): Payer: Self-pay

## 2019-03-02 ENCOUNTER — Ambulatory Visit (HOSPITAL_COMMUNITY)
Admission: RE | Admit: 2019-03-02 | Discharge: 2019-03-02 | Disposition: A | Discharge status: Home / Self Care | Payer: Medicare Other | Source: Ambulatory Visit | Attending: Cardiology | Admitting: Cardiology

## 2019-03-02 VITALS — BP 120/40 | HR 81 | Wt 149.6 lb

## 2019-03-02 DIAGNOSIS — I255 Ischemic cardiomyopathy: Secondary | ICD-10-CM | POA: Diagnosis not present

## 2019-03-02 DIAGNOSIS — Z9889 Other specified postprocedural states: Secondary | ICD-10-CM | POA: Diagnosis not present

## 2019-03-02 DIAGNOSIS — I2510 Atherosclerotic heart disease of native coronary artery without angina pectoris: Secondary | ICD-10-CM | POA: Diagnosis not present

## 2019-03-02 DIAGNOSIS — I080 Rheumatic disorders of both mitral and aortic valves: Secondary | ICD-10-CM | POA: Diagnosis not present

## 2019-03-02 DIAGNOSIS — I484 Atypical atrial flutter: Secondary | ICD-10-CM | POA: Diagnosis not present

## 2019-03-02 DIAGNOSIS — E785 Hyperlipidemia, unspecified: Secondary | ICD-10-CM | POA: Diagnosis not present

## 2019-03-02 DIAGNOSIS — I252 Old myocardial infarction: Secondary | ICD-10-CM | POA: Insufficient documentation

## 2019-03-02 DIAGNOSIS — Z8673 Personal history of transient ischemic attack (TIA), and cerebral infarction without residual deficits: Secondary | ICD-10-CM | POA: Insufficient documentation

## 2019-03-02 DIAGNOSIS — I5022 Chronic systolic (congestive) heart failure: Secondary | ICD-10-CM | POA: Diagnosis not present

## 2019-03-02 DIAGNOSIS — Z7982 Long term (current) use of aspirin: Secondary | ICD-10-CM | POA: Insufficient documentation

## 2019-03-02 DIAGNOSIS — Z8249 Family history of ischemic heart disease and other diseases of the circulatory system: Secondary | ICD-10-CM | POA: Diagnosis not present

## 2019-03-02 DIAGNOSIS — Z951 Presence of aortocoronary bypass graft: Secondary | ICD-10-CM

## 2019-03-02 DIAGNOSIS — I480 Paroxysmal atrial fibrillation: Secondary | ICD-10-CM | POA: Diagnosis not present

## 2019-03-02 DIAGNOSIS — Z79899 Other long term (current) drug therapy: Secondary | ICD-10-CM | POA: Diagnosis not present

## 2019-03-02 DIAGNOSIS — I272 Pulmonary hypertension, unspecified: Secondary | ICD-10-CM | POA: Diagnosis not present

## 2019-03-02 DIAGNOSIS — I872 Venous insufficiency (chronic) (peripheral): Secondary | ICD-10-CM | POA: Insufficient documentation

## 2019-03-02 DIAGNOSIS — G4733 Obstructive sleep apnea (adult) (pediatric): Secondary | ICD-10-CM | POA: Diagnosis not present

## 2019-03-02 DIAGNOSIS — I110 Hypertensive heart disease with heart failure: Secondary | ICD-10-CM | POA: Diagnosis not present

## 2019-03-02 DIAGNOSIS — I4510 Unspecified right bundle-branch block: Secondary | ICD-10-CM | POA: Insufficient documentation

## 2019-03-02 DIAGNOSIS — I2721 Secondary pulmonary arterial hypertension: Secondary | ICD-10-CM | POA: Insufficient documentation

## 2019-03-02 LAB — BASIC METABOLIC PANEL
Anion gap: 14 (ref 5–15)
BUN: 22 mg/dL (ref 8–23)
CO2: 30 mmol/L (ref 22–32)
Calcium: 9.2 mg/dL (ref 8.9–10.3)
Chloride: 88 mmol/L — ABNORMAL LOW (ref 98–111)
Creatinine, Ser: 1.26 mg/dL — ABNORMAL HIGH (ref 0.61–1.24)
GFR calc Af Amer: >60 mL/min (ref >60)
GFR calc non Af Amer: 54 mL/min — ABNORMAL LOW (ref >60)
Glucose, Bld: 100 mg/dL — ABNORMAL HIGH (ref 70–99)
Potassium: 3.1 mmol/L — ABNORMAL LOW (ref 3.5–5.1)
Sodium: 132 mmol/L — ABNORMAL LOW (ref 135–145)

## 2019-03-02 LAB — DIGOXIN LEVEL: Digoxin Level: <0.2 ng/mL — ABNORMAL LOW (ref 0.8–2.0)

## 2019-03-02 MED ORDER — TORSEMIDE 20 MG PO TABS: ORAL_TABLET | ORAL | 2 refills | Status: DC

## 2019-03-02 MED ORDER — SACUBITRIL-VALSARTAN 24-26 MG PO TABS: 1.0000 | ORAL_TABLET | Freq: Two times a day (BID) | ORAL | 5 refills | Status: DC

## 2019-03-02 MED ORDER — DIGOXIN 125 MCG PO TABS: 0.1250 mg | ORAL_TABLET | Freq: Every day | ORAL | 3 refills | Status: DC

## 2019-03-02 MED FILL — ENTRESTO 24 MG-26 MG TABLET: 24-26 | 30 days supply | Qty: 60 | Fill #0

## 2019-03-02 MED FILL — TORSEMIDE 20 MG TABLET: 20 | 90 days supply | Qty: 540 | Fill #0

## 2019-03-02 NOTE — Patient Instructions (Signed)
STOP Valsartan  START Entresto 24/73m (1 tab)    KEEP Torsemide 830m(4 tabs) in the morning and 4086m2 tabs)  INCREASE Digoxin to 0.125m73m tab) daily  Labs today We will only contact you if something comes back abnormal or we need to make some changes. Otherwise no news is good news!  Your physician has recommended that you have a cardiopulmonary stress test (CPX). CPX testing is a non-invasive measurement of heart and lung function. It replaces a traditional treadmill stress test. This type of test provides a tremendous amount of information that relates not only to your present condition but also for future outcomes. This test combines measurements of you ventilation, respiratory gas exchange in the lungs, electrocardiogram (EKG), blood pressure and physical response before, during, and following an exercise protocol.  Your physician recommends that you schedule a follow-up appointment in: 1 week with Dr McleAundra Dubin [facility] and your health needs are our priority. As part of our continuing mission to provide you with exceptional heart care, we have created designated Provider Care Teams. These Care Teams include your primary Cardiologist (physician) and Advanced Practice Providers (APPs- Physician Assistants and Nurse Practitioners) who all work together to provide you with the care you need, when you need it.   You may see any of the following providers on your designated Care Team at your next follow up: . DrMarland KitchenDaniGlori Bickersr DaltLoralie Champagnemy Darrick Grinder . BritLyda Jester . LaurAudry RilesarmD   Please be sure to bring in all your medications bottles to every appointment.

## 2019-03-02 NOTE — Progress Notes (Signed)
6 min walk test completed, pt ambulated 1400 ft (426 m) on RA O2 sats ranged 97-99% and HR ranged 94-115.

## 2019-03-02 NOTE — Telephone Encounter (Signed)
Pt advised to take extra Potassium 40 meq today.  Verbalized understanding.

## 2019-03-02 NOTE — Progress Notes (Signed)
ReDS Vest / Clip - 03/02/19 1300      ReDS Vest / Clip   Station Marker  C    Ruler Value  32    ReDS Value Range  Moderate volume overload    ReDS Actual Value  36

## 2019-03-02 NOTE — Telephone Encounter (Signed)
-----  Message from Larey Dresser, MD sent at 03/02/2019  2:14 PM EST ----- Take an extra KCl 40 just today.

## 2019-03-02 NOTE — Progress Notes (Signed)
Patient ID: Caleb Moment, PhD, male   DOB: 11-07-1940, 78 y.o.   MRN: [id number]    Advanced Heart Failure Clinic Note   PCP: [name] EP: [name] Cardiology: [name]  78 y.o.with complex past history presents for heart failure followup.  Patient had anterior MI in 2004 and developed ischemic cardiomyopathy as well as mitral regurgitation. He also developed atrial fibrillation.  In 10/14, he had MV repair, Maze, CABG with LIMA-LAD, and LA appendage closure at [facility] in [place].  Subsequently, atrial fibrillation returned and he had an atrial fibrillation ablation in [facility] in 3/15.  He wore a Zio patch in 12/15 and had a low atrial fibrillation burden of 7%.  He has had a long-standing ischemic cardiomyopathy.  In 2015, EF was 20-25%.  Echo in 2016 also showed EF 25-30% but estimated PA pressure suggested severe pulmonary hypertension.     At initial appointment, he reported increased exertional dyspnea over a number of weeks.  RHC was done, showing mildly elevated PCWP with moderate pulmonary HTN and low cardiac index (1.93 thermo, 2.13 Fick).  V/Q scan showed no PE.  I started him on digoxin and have titrated his Lasix to 80 mg daily.  He is now on bisoprolol and able to tolerate it.  At last appointment, I had him try replacing valsartan with Entresto 24/26 bid.  He was unable to tolerate it (made him dizzy, though BP was not low).  Therefore, I had him restart valsartan.  CPX in 4/16 showed mildly decreased functional capacity.  He has tolerated Adcirca 20 mg daily.  He did not tolerate an attempt to uptitrate bisoprolol.  He tried CPAP but was unable to tolerate it.  He was unable to tolerate eplerenone 50 mg daily.  Last echo in 9/17 showed EF 20-25%, stable MV repair, normal RV, and PA systolic pressure 86 mmHg.   He had a Lexiscan Cardiolite in 9/17 that showed infarction, no ischemia.  [place] in 10/17 showed severe mixed pulmonary venous HTN/pulmonary arterial HTN with PVR 4.3  WU and PCWP mildly elevated.  Adcirca was increased to 40 mg daily.   At a prior appointment, he was having more palpitations.  Event monitor in 11/17 showed atrial fibrillation episodes, these were symptomatic.  Since then, the atrial fibrillation seems to have decreased.  He saw [name] to discuss Tikosyn initiation.  No decision was reached at that time, and since palpitations have decreased again, he wants to hold off on Tikosyn for now.   He has OSA.  He cannot tolerate CPAP but is using his oral appliance.    HR was lower in 5/18.  He came into the office with HR around 40.  ECG showed an ectopic atrial rhythm with very small P waves. I stopped bisoprolol and later digoxin.  HR has gone up since.  He wore an event monitor in 6/18 showing rare 3-5 second pauses at night only, rare atrial fibrillation, and rare junctional bradycardia.  He wore an event monitor again in 4/19, this showed rare afib (1% total) with nocturnal bradycardia and 1 nocturnal 3 second pause.  No concerning findings.   He stopped Adcirca as his insurance was no longer covering it.    He saw [name], and it was decided that he would be unlikely to improve much with CRT with his current IVCD (not true LBBB).   Echo (7/19) is comparable to the past with mildly dilated LV, EF 25% with wall motion abnormalities,  mild RV dilation/mild decreased function, mild to moderate AI, stable repaired mitral valve, PASP 76 mmHg.     Given worsening symptoms, he had RHC in 8/19.  This showed evidence for volume overload with severe pulmonary arterial hypertension, PVR 5.8 WU and preserved cardiac output. Torsemide was increased.    He was started on Opsumit.  He says that it helped his breathing but he was unable to continue it due to considerable worsening of his peripheral edema.  He has stopped Opsumit.    After developing increased atrial fibrillation burden, I put him back on bisoprolol 2.5 mg daily.  He developed bradycardia and  presyncope, so this was stopped.  Lightheadedness resolved.  Zio patch in 6/20 off bisoprolol showed average HR 68, short runs of SVT (possible atrial fibrillation) and only nocturnal bradycardia.    Echo was done in 7/20 and reviewed, EF 15%, moderate-severe LV dilation, mild RV dilation with decreased systolic function, mild-moderate MR s/p MV repair, mild-moderate AI, PASP 40 mmHg.   [name] called in earlier this week with weight gain, dyspnea walking around his house, and worsening lower leg edema. I had him increase torsemide to 80 qam/40 qpm and take a dose of metolazone.  He says that he urinated vigorously and weight is down considerably (back down to same weight as last appt).  He feels like his breathing is back to normal, he is not dyspneic walking on flat ground. No orthopnea/PND.  No chest pain.  No lightheadedness.   REDS clip 36%  6 minute walk (3/16): 381 m  6 minute walk (5/16): 414.5 m 6 minute walk (10/16): 562 m 6 minute walk (1/17): 469 m 6 minute walk (8/17): 488 m 6 minute walk (6/18): 549 m 6 minute walk (11/18): 366 m 6 minute walk (9/19): 518 m 6 minute walk (9/20): 487 m 6 minute walk (12/20): 426 m  ECG (personally reviewed): NSR with 1st degree AVB, RBBB  Labs (2/15): LDL 144 Labs (8/15): K 4.6, creatinine 0.9 Labs (12/15): HCT 42.3   Labs (2/16): K 4 => 4.2, creatinine 1.05 => 0.92, BNP 268 Labs (3/16): BNP 495 => 320, digoxin 0.4, RF 14.7 (very mild increase), TSH normal, HIV negative, anti-SCL70 negative, creatinine 0.91, K 4.4 Labs (7/16): K 5, creatinine 1.05, BNP 455, vitamin D normal, digoxin 0.4, B12 normal Labs (8/16): digoxin 0.8, BNP 305, K 4.2, creatinine 0.99 Labs (9/16): HCT 43.3, TSH normal, BNP 492 => 278, K 4.3, creatinine 0.97 => 1.04, digoxin 0.6, TSH normal, LDL 154, LDL-P 1654.  Labs (10/16): K 4.7, creatinine 1.1 Labs (1/17): pro-BNP 2065, digoxin 0.3, K 4.3, creatinine 1.10 => 1.28 Labs (3/17): K 4.1, creatinine 1.09, HCT  38.3 Labs (4/17): K 4.4, creatinine 0.99, digoxin 0.8 Labs (5/17): K 4.3, creatinine 1.08, BNP 317, hgb 13.1 Labs (8/17): K 4.6, creatinine 0.99, BNP 392, digoxin 0.5 Labs (9/17): K 4.4, creatinine 1.05 Labs (10/17): digoxin 0.5, K 4.1, creatinine 1.12, HCT 44.9, digoxin 0.5 Labs (11/17): K 4 => 3.8, creatinine 1.3 => 1.16, digoxin 0.5, BNP 296 Labs (12/17): K 4.1, creatinine 1.15, BNP 294, digoxin 0.7 Labs (2/18): LDL 135, Lp(a) 124, LDL-P 979  Labs (5/18): K 4.3, creatinine 1.07 => 0.95, BNP 224, hgb 14.4, digoxin 0.6 Labs (6/18): K 4.4, creatinine 1.08, hgb 14 Labs (8/18): K 4.1, creatinine 1.05 Labs (12/18): K 3.7, creatinine 0.94 Labs (2/19): K 4.1, creatinine 0.99, BNP 255 Labs (5/19): K 4, creatinine 0.98, LDL 140 Labs (7/19): LDL 136 Labs (9/19): K 4.1, creatinine  0.91 Labs (12/19): K 3.6, creatinine 1.18, BNP 479, hgb 12.9 Labs (3/20): K 3.4, creatinine 1.14 Labs (5/20): K 4.1, creatinine 0.96 Labs (7/20): K 4, creatinine 1.05, BNP 407  Labs (10/20): BNP 489, digoxin < 0.2 Labs (11/20): K 3.5, creatinine 1.0  PMH: 1. CAD: Anterior MI in 2004.  Cardiac surgery in 10/14 included LIMA-LAD.  - Lexiscan Cardiolite (9/17): EF 35%, infarct present with no ischemia.  2. Chronic mitral regurgitation: 10/14 surgery at [facility] with MV repair, Maze, LIMA-LAD, and LA appendage closure.  3. Atrial fibrillation: Paroxysmal.  He was initially on Tikosyn but had breakthrough atrial fibrillation.  H/o Maze in 2014.  Had recurrent atrial fibrillation with ablation in 3/15 by Dr Ola Spurr in [facility].  Not anticoagulated after 2 severe prostate bleeding episodes.  LA appendage was oversewn with MV surgery.  - Zio patch in 12/15 with low atrial fibrillation burden (7%).  - Holter (9/16) with PACs, PVCs, short atrial fibrillation runs (nothing sustained). - Event monitor (11/17) with runs of atrial fibrillation and flutter.   - Event monitor (6/18) with rare atrial fibrillation - Event  monitor 4/19 showing rare afib (1% total) with nocturnal bradycardia and 1 nocturnal 3 second pause.  No concerning findings.  - Zio patch (6/20): Primarily NSR, average HR 68, short SVT runs (possible atrial fibrillation), occasional nocturnal bradycardia (nothing worrisome).  4. Chronotropic incompetence.  5. HTN 6. Hyperlipidemia: Refuses statin.  7. Peripheral neuropathy 8. GERD 9. H/o BPPV 10. H/o TIA 11. Ischemic cardiomyopathy: cardiac MRI 7/14 with EF 35%, moderate MR, normal RV size and systolic function, extensive anterior and anteroseptal LGE suggestive of non-viable myocardium (this was prior to LIMA-LAD).  Echo 1/15 with EF 20-25%, moderate AI, PA systolic pressure 39 mmHg. Echo (1/16) with EF 20-25%, diffuse hypokinesis with regionality, moderate LV dilation, moderate AI, s/p MV repair with mild MR and normal gradients, RV dilated with mildly decreased systolic function, PA systolic pressure 71 mmHg.   - RHC (2/16) with mean RA 9, PA 61/25 mean 40, mean PCWP 23, CI 2.13/PVR 4.3 (Fick), CI 1.93/PVR 4.7 (thermo).   - CPX (4/16) with peak VO2 17.9, VE/VCO2 34.7 => mildly decreased functional capacity.  - Spironolactone apparently caused cognitive deficits - Intolerant of Coreg due to development of severe alopecia - Unable to uptitrate bisoprolol due to intolerance.  - Lightheaded with Entresto.  - Unable to tolerate increase in valsartan to 80 mg bid.  - Echo (1/17) with EF 30-35%, moderate LV dilation, mild AI, s/p MV repair with mild MR, moderately dilated RV with mildly decreased systolic function, PA systolic pressure 69 mmHg.  - CPX (4/17): peak VO2 18, VE/VCO2 slope 33, RER 1.28 => mild to moderate functional impairment, mildly improved.  - Echo (9/17): EF 20-25% with regional WMAs, normal RV size and systolic function, PASP 86 mmHg, stable repaired mitral valve with mild MR, moderate AI.  - RHC (10/17): mean RA 9, PA 70/23 mean 43, PCWP mean 20, CI 2.96 Fick/2.84 Thermo, PVR  4.3 WU.  - Echo (9/18): severe LV dilation, EF 20-25% with WMAs, s/p MV repair with mild MR, moderate AI, moderate TR, PASP 68 mmHg, normal RV size and systolic function.  - Echo (7/19): mildly dilated LV, EF 25% with wall motion abnormalities, mild RV dilation/mild decreased function, mild to moderate AI, stable repaired mitral valve (no MS, mild MR), PASP 76 mmHg.   - CPX (8/19): peak VO2 18.9, VE/VCO2 slope 40, RER 1.09 => moderate HF limitation.  - RHC (  8/19): mean RA 14, PA 75/28 mean 47, mean PCWP 24, CI 2.44/PVR 5 Fick, CI 2.13/PVR 5.8 thermo.  - Echo (7/20): EF 15%, moderate-severe LV dilation, mild RV dilation with decreased systolic function, mild-moderate MR s/p MV repair, mild-moderate AI, PASP 40 mmHg.  12. Carotid stenosis: Carotid dopplers (1/16) with 40-59% bilateral ICA stenosis. Carotid dopplers (4/17) with 40-59% BICA stenosis.  - Carotid dopplers (2/18) with < 50% BICA stenosis.  - Carotid dopplers (10/20): Mild stenosis.  13. Aortic insufficiency: Mild-moderate by last echo.  14. Pulmonary HTN: Mixed PAH and pulmonary venous hypertension.  PFTs (9/14) were normal.  V/Q scan (2/16) with no evidence of acute or chronic PE.   - Unable to tolerate Opsumit due to extensive peripheral edema.  15. OSA: Moderate on 5/16 sleep study. Unable to tolerate CPAP.  Repeat sleep study at [facility] in 2/18 was also suggestive of OSA.  16. Venous insufficiency 17. Ventral hernia 18. Bradycardia: Holter (5/18) with overnight pauses up to 4.7 sec (likely due to OSA), occasional short atrial tachycardia runs, no atrial fibrillation, avg HR 70s, 3% PVCs.  - Event monitor (6/18): Rare 3-4 second pauses at night, rare atrial fibrillation, rare runs of junctional bradycardia.   SH: Married, lives in [place], Engineer, water, nonsmoker  FH: CAD  ROS: All systems reviewed and negative except as per HPI.   Current Outpatient Medications  Medication Sig Dispense Refill  . ascorbic acid (VITAMIN C)  1000 MG tablet Take by mouth.    Marland Kitchen aspirin EC 81 MG tablet Take 1 tablet (81 mg total) by mouth daily. 30 tablet 3  . digoxin (LANOXIN) 0.125 MG tablet Take 1 tablet (0.125 mg total) by mouth daily. 90 tablet 3  . eplerenone (INSPRA) 25 MG tablet TAKE 1 TABLET BY MOUTH ONCE DAILY 30 tablet 11  . magnesium gluconate (MAGONATE) 500 MG tablet Take 500 mg by mouth 2 (two) times daily.     . metolazone (ZAROXOLYN) 2.5 MG tablet Take 1 tablet (2.5 mg total) by mouth daily. Take as directed by heart failure team (Patient taking differently: Take 2.5 mg by mouth daily. Only on wed) 5 tablet 0  . Omega-3 Fatty Acids (FISH OIL PO) Take 360 mg by mouth daily.     . potassium chloride (K-DUR,KLOR-CON) 20 MEQ tablet Take 2.5 tablets (50 mEq total) by mouth daily. 225 tablet 3  . sacubitril-valsartan (ENTRESTO) 24-26 MG Take 1 tablet by mouth 2 (two) times daily. 60 tablet 5  . tadalafil, PAH, (ADCIRCA) 20 MG tablet Take 1 tablet (20 mg total) by mouth daily. (Patient taking differently: Take 10 mg by mouth daily. ) 60 tablet 6  . torsemide (DEMADEX) 20 MG tablet Take 4 tablets (80 mg total) by mouth every morning AND 2 tablets (40 mg total) every evening. 540 tablet 2   No current facility-administered medications for this encounter.   BP (!) 120/40   Pulse 81   Wt 67.9 kg (149 lb 9.6 oz)   SpO2 97%   BMI (P) 21.47 kg/m    Wt Readings from Last 3 Encounters:  03/02/19 67.9 kg (149 lb 9.6 oz)  02/21/19 68.7 kg (151 lb 7.3 oz)  02/07/19 68.9 kg (151 lb 14.4 oz)    General: NAD Neck: JVP 8 cm, no thyromegaly or thyroid nodule.  Lungs: Clear to auscultation bilaterally with normal respiratory effort. CV: Nondisplaced PMI.  Heart regular S1/S2, no S3/S4, 2/6 HSM LLSB.  2+ edema to knees bilaterally.   Abdomen: Soft, nontender, no hepatosplenomegaly,  no distention.  Skin: Intact without lesions or rashes.  Neurologic: Alert and oriented x 3.  Psych: Normal affect. Extremities: No clubbing or cyanosis.   HEENT: Normal.   Assessment/Plan: 1. CAD: S/p LIMA-LAD.  He has decided not to take statins after reviewing the data (I did recommend taking a statin but we have agreed to disagree on this, he is taking red yeast rice extract).  Lexiscan Cardiolite was done in 9/17 due to atypical chest pain.  This showed prior infarction with no ischemia.  No recurrence of chest pain since that time despite ongoing exercise.   - Continue ASA 81 daily.  2. S/p mitral valve repair: The MV repair looked stable on 7/20 echo with no MS, mild-moderate MR.  3. Aortic insufficiency: Echo in 9/20 with mild-moderate AI, stable.  4. Chronic systolic CHF: Ischemic cardiomyopathy, EF 15% with mildly decreased RV systolic function on 3/90 echo.  Most recent CPX in 8/19 with moderate HF limitation, mildly worse than prior.  He was volume overloaded on 8/19 RHC with severe pulmonary hypertension but preserved cardiac output.  Recent worsening of volume overload and weight gain, required metolazone earlier this week (first time he had to take this).  Weight is back to baseline and symptoms back to baseline, NYHA class II symptoms.  He has mild residual volume overload by exam and REDS clip. 6 minute walk is mildly less than in the past.  - Keep torsemide at 80 qam/40 qpm.  BMET today and again in 10 days.    - Off bisoprolol with bradycardia.  - Increase digoxin to 0.125 mg daily.  Digoxin level 10 days.  - Stop valsartan, start Entresto 24/26 bid.    - Continue eplerenone 25 (unable to tolerate increase).    - Dapagliflozin would be a good next step for him.      - He would be unlikely to benefit much from CRT, have reviewed with [name] (had IVCD not LBBB in the past, now today has RBBB).   - Continue pulmonary rehab.  - Still needs CPX, will arrange.  - LVAD would not be out of the question, but age and prior sternotomy make it higher risk.  He says that he would not be interested in another heart surgery so will hold off on  RHC for now.  5. Atrial fibrillation and atypical atrial flutter: s/p Maze in 10/14, then ablation in 3/15.  Prior to Maze, he was on Tikosyn but had breakthrough.  Currently, he is not anticoagulated due to history of prostate bleeding and his choice.  He did have his LA appendage oversewn at time of MV surgery.   1% atrial fibrillation on 4/19 event monitor.  Zio patch in 6/20 with short SVT runs (possible atrial fibrillation).  ECG today showed NSR with a long PR interval.  He has a RBBB which recent prior ECGs have not had, possibly rate-related (he did have an ECG earlier this year with RBBB).   - He is off bisoprolol with bradycardia.    - We have had discussions about what to do with his atrial fibrillation/flutter.  He is symptomatic at times when in atrial fibrillation/fluttter. He had breakthrough on Tikosyn in the past, but has had Maze and atrial fibrillation ablation since that time. He opted to hold off on trying Tikosyn again and currently feels like the atrial fibrillation/flutter burden is manageable.   6. Pulmonary hypertension: Severe by echo in 9/17 and on RHC in 10/17.  RV actually looked  ok on 9/17 echo.  Mixed pulmonary venous and pulmonary arterial HTN on RHC.  It is possible that the [facility] component is due to pulmonary vascular remodeling in the setting of chronic mitral regurgitation prior to MV repair. Negative V/Q scan, no evidence for CTEPH.  PFTs normal in 9/14.  He is seeing Dr Lake Bells.  Sleep study showed moderate OSA but he has been unable to tolerate CPAP and is now using an oral device. PASP 76 mmHg by echo in 7/19.  He tried Engineer, site but stopped it so that he could take a nitric oxide supplement.  I repeated RHC in 8/19.  This showed severe mixed pulmonary venous/pulmonary arterial HTN with PVR 5.8 WU and preserved cardiac output. He was started on Opsumit but did not tolerate due to extensive peripheral edema.  - Breathing improved with Opsumit, but he was unable to tolerate  due to severe peripheral edema.     - He is taking tadalafil at 10 mg daily rather than 20 mg daily or 40 mg daily (based on an alternative herbal regimen he is following). I told him that I would rather have him on tadalafil 20 mg daily but as he does not want to do this.  - We have discussed a trial of selexipag.  He will consider it in the future.  Would need to get him to euvolemia prior to starting.  7. OSA: Moderate.  Has not tolerated CPAP. Probably plays a role in recurrent atrial fibrillation and pulmonary hypertension as well as nocturnal sinus pauses.  He has been using his oral device. 8. Carotid stenosis: Mild on 10/20 dopplers.   9. Bradycardia: Nocturnal bradycardia in the past likely related to OSA.  Developed more ongoing bradycardia and bisoprolol and digoxin stopped.  Restarted later on bisoprolol with afib/mild RVR but became bradycardic with pre-syncope and bisoprolol stopped. I think that he has sick sinus syndrome.   I suspect he will eventually need a PPM, but currently, his HR is adequate off bisoprolol, and he has tolerated restarting digoxin.   - He will stay off beta blockers.  - Follow for bradyarrhythmias after increase in digoxin today.   Followup in 1 week.    Loralie Champagne 03/02/2019

## 2019-03-07 ENCOUNTER — Encounter (HOSPITAL_COMMUNITY)
Admission: RE | Admit: 2019-03-07 | Discharge: 2019-03-07 | Disposition: A | Discharge status: Home / Self Care | Payer: Medicare Other | Source: Ambulatory Visit

## 2019-03-07 ENCOUNTER — Ambulatory Visit (HOSPITAL_COMMUNITY): Payer: Medicare Other

## 2019-03-07 ENCOUNTER — Telehealth (HOSPITAL_COMMUNITY): Payer: Self-pay | Admitting: *Deleted

## 2019-03-07 DIAGNOSIS — I5022 Chronic systolic (congestive) heart failure: Secondary | ICD-10-CM

## 2019-03-07 NOTE — Telephone Encounter (Signed)
Please see notes below.   Thanks

## 2019-03-07 NOTE — Progress Notes (Signed)
Pulmonary Individual Treatment Plan  Patient Details  Name: Caleb CANTER, PhD MRN: [id number] Date of Birth: September 29, 1940 Referring Provider:     Pulmonary Rehab Walk Test from 12/27/2018 in [facility]  Referring Provider  [name]      Initial Encounter Date:    Pulmonary Rehab Walk Test from 12/27/2018 in [facility]  Date  12/27/18      Visit Diagnosis: Heart failure, chronic systolic ([place])  Patient's Home Medications on Admission:   Current Outpatient Medications:  .  ascorbic acid (VITAMIN C) 1000 MG tablet, Take by mouth., Disp: , Rfl:  .  aspirin EC 81 MG tablet, Take 1 tablet (81 mg total) by mouth daily., Disp: 30 tablet, Rfl: 3 .  digoxin (LANOXIN) 0.125 MG tablet, Take 1 tablet (0.125 mg total) by mouth daily., Disp: 90 tablet, Rfl: 3 .  eplerenone (INSPRA) 25 MG tablet, TAKE 1 TABLET BY MOUTH ONCE DAILY, Disp: 30 tablet, Rfl: 11 .  magnesium gluconate (MAGONATE) 500 MG tablet, Take 500 mg by mouth 2 (two) times daily. , Disp: , Rfl:  .  metolazone (ZAROXOLYN) 2.5 MG tablet, Take 1 tablet (2.5 mg total) by mouth daily. Take as directed by heart failure team (Patient taking differently: Take 2.5 mg by mouth daily. Only on wed), Disp: 5 tablet, Rfl: 0 .  Omega-3 Fatty Acids (FISH OIL PO), Take 360 mg by mouth daily. , Disp: , Rfl:  .  potassium chloride (K-DUR,KLOR-CON) 20 MEQ tablet, Take 2.5 tablets (50 mEq total) by mouth daily., Disp: 225 tablet, Rfl: 3 .  sacubitril-valsartan (ENTRESTO) 24-26 MG, Take 1 tablet by mouth 2 (two) times daily., Disp: 60 tablet, Rfl: 5 .  tadalafil, PAH, (ADCIRCA) 20 MG tablet, Take 1 tablet (20 mg total) by mouth daily. (Patient taking differently: Take 10 mg by mouth daily. ), Disp: 60 tablet, Rfl: 6 .  torsemide (DEMADEX) 20 MG tablet, Take 4 tablets (80 mg total) by mouth every morning AND 2 tablets (40 mg total) every evening., Disp: 540 tablet, Rfl: 2  Past Medical  History: Past Medical History:  Diagnosis Date  . Allergic rhinitis   . Atrial fibrillation ([place])   . Atypical pneumonia   . CAD (coronary artery disease)   . Cardiomyopathy, ischemic   . Chronic anticoagulation   . Cough   . Dizziness   . GERD (gastroesophageal reflux disease)   . Heart failure, systolic, acute on chronic ([place])   . Hyperlipidemia   . Hypertension   . OSA (obstructive sleep apnea)    Home sleep test 07/05/2009 AHI 8.2  . Pleural effusion   . Positional vertigo   . TIA (transient ischemic attack)     Tobacco Use: Social History   Tobacco Use  Smoking Status Never Smoker  Smokeless Tobacco Never Used    Labs: Recent Review Flowsheet Data    Labs for ITP Cardiac and Pulmonary Rehab Latest Ref Rng & Units 01/10/2016 01/10/2016 08/17/2017 10/07/2017 11/16/2017   Cholestrol 0 - 200 mg/dL - - 212(H) 206(H) -   LDLCALC 0 - 99 mg/dL - - 140(H) 136(H) -   LDLDIRECT mg/dL - - - - -   HDL >40 mg/dL - - 55 53 -   Trlycerides <150 mg/dL - - 86 84 -   Hemoglobin A1c 4.8 - 5.6 % - - 5.5 - -   PHART 7.350 - 7.450 - - - - -   PCO2ART 35.0 - 45.0 mmHg - - - - -  HCO3 20.0 - 28.0 mmol/L 26.7 26.1 - - 24.8   TCO2 22 - 32 mmol/L 28 27 - - 26   ACIDBASEDEF 0.0 - 2.0 mmol/L - - - - -   O2SAT % 68.0 67.0 - - 69.0      Capillary Blood Glucose: No results found for: GLUCAP   Pulmonary Assessment Scores: Pulmonary Assessment Scores    Row Name 12/27/18 1544         ADL UCSD   ADL Phase  Entry     SOB Score total  40       CAT Score   CAT Score  15       mMRC Score   mMRC Score  3       UCSD: Self-administered rating of dyspnea associated with activities of daily living (ADLs) 6-point scale (0 = "not at all" to 5 = "maximal or unable to do because of breathlessness")  Scoring Scores range from 0 to 120.  Minimally important difference is 5 units  CAT: CAT can identify the health impairment of COPD patients and is better correlated with disease progression.   CAT has a scoring range of zero to 40. The CAT score is classified into four groups of low (less than 10), medium (10 - 20), high (21-30) and very high (31-40) based on the impact level of disease on health status. A CAT score over 10 suggests significant symptoms.  A worsening CAT score could be explained by an exacerbation, poor medication adherence, poor inhaler technique, or progression of COPD or comorbid conditions.  CAT MCID is 2 points  mMRC: mMRC (Modified Medical Research Council) Dyspnea Scale is used to assess the degree of baseline functional disability in patients of respiratory disease due to dyspnea. No minimal important difference is established. A decrease in score of 1 point or greater is considered a positive change.   Pulmonary Function Assessment: Pulmonary Function Assessment - 12/27/18 1544      Breath   Bilateral Breath Sounds  Clear    Shortness of Breath  Yes;Limiting activity       Exercise Target Goals: Exercise Program Goal: Individual exercise prescription set using results from initial 6 min walk test and THRR while considering  patient's activity barriers and safety.   Exercise Prescription Goal: Initial exercise prescription builds to 30-45 minutes a day of aerobic activity, 2-3 days per week.  Home exercise guidelines will be given to patient during program as part of exercise prescription that the participant will acknowledge.  Activity Barriers & Risk Stratification:   6 Minute Walk: 6 Minute Walk    Row Name 11/09/18 1506 12/27/18 1559       6 Minute Walk   Phase  Discharge  Initial    Distance  1613 feet  1500 feet    Distance % Change  -1.29 %  --    Distance Feet Change  -21 ft  --    Walk Time  6 minutes  6 minutes    # of Rest Breaks  0  0    MPH  3  2.84    METS  3.5  3.14    RPE  12  14    Perceived Dyspnea   0  3    VO2 Peak  12.56  10.89    Symptoms  No  No    Resting HR  75 bpm  86 bpm    Resting BP  116/62  128/74     Resting  Oxygen Saturation   --  97 %    Exercise Oxygen Saturation  during 6 min walk  --  89 %    Max Ex. HR  114 bpm  97 bpm    Max Ex. BP  148/72  132/66    2 Minute Post BP  108/62  128/72      Interval HR   1 Minute HR  --  95    2 Minute HR  --  90    3 Minute HR  --  92    4 Minute HR  --  97    5 Minute HR  --  96    6 Minute HR  --  96    2 Minute Post HR  --  115 likely in Afib    Interval Heart Rate?  --  Yes      Interval Oxygen   Interval Oxygen?  --  Yes    Baseline Oxygen Saturation %  --  97 %    1 Minute Oxygen Saturation %  --  97 %    1 Minute Liters of Oxygen  --  0 L    2 Minute Oxygen Saturation %  --  97 %    2 Minute Liters of Oxygen  --  0 L    3 Minute Oxygen Saturation %  --  89 %    3 Minute Liters of Oxygen  --  0 L    4 Minute Oxygen Saturation %  --  91 %    4 Minute Liters of Oxygen  --  0 L    5 Minute Oxygen Saturation %  --  90 %    5 Minute Liters of Oxygen  --  0 L    6 Minute Oxygen Saturation %  --  90 %    6 Minute Liters of Oxygen  --  0 L    2 Minute Post Oxygen Saturation %  --  98 %    2 Minute Post Liters of Oxygen  --  0 L       Oxygen Initial Assessment: Oxygen Initial Assessment - 12/27/18 1559      Home Oxygen   Home Oxygen Device  None    Sleep Oxygen Prescription  None    Home Exercise Oxygen Prescription  None    Home at Rest Exercise Oxygen Prescription  None    Compliance with Home Oxygen Use  Yes      Initial 6 min Walk   Oxygen Used  None      Program Oxygen Prescription   Program Oxygen Prescription  None      Intervention   Short Term Goals  To learn and exhibit compliance with exercise, home and travel O2 prescription;To learn and understand importance of monitoring SPO2 with pulse oximeter and demonstrate accurate use of the pulse oximeter.;To learn and understand importance of maintaining oxygen saturations>88%;To learn and demonstrate proper pursed lip breathing techniques or other breathing  techniques.;To learn and demonstrate proper use of respiratory medications    Long  Term Goals  Exhibits compliance with exercise, home and travel O2 prescription;Verbalizes importance of monitoring SPO2 with pulse oximeter and return demonstration;Maintenance of O2 saturations>88%;Exhibits proper breathing techniques, such as pursed lip breathing or other method taught during program session;Compliance with respiratory medication       Oxygen Re-Evaluation: Oxygen Re-Evaluation    Row Name 01/10/19 1554 02/07/19 0710 03/06/19 1031  Program Oxygen Prescription   Program Oxygen Prescription  None  None  None       Home Oxygen   Home Oxygen Device  None  None  None     Sleep Oxygen Prescription  None  None  None     Home Exercise Oxygen Prescription  None  None  None     Home at Rest Exercise Oxygen Prescription  None  None  None     Compliance with Home Oxygen Use  Yes  Yes  Yes       Goals/Expected Outcomes   Short Term Goals  To learn and exhibit compliance with exercise, home and travel O2 prescription;To learn and understand importance of monitoring SPO2 with pulse oximeter and demonstrate accurate use of the pulse oximeter.;To learn and understand importance of maintaining oxygen saturations>88%;To learn and demonstrate proper pursed lip breathing techniques or other breathing techniques.;To learn and demonstrate proper use of respiratory medications  To learn and exhibit compliance with exercise, home and travel O2 prescription;To learn and understand importance of monitoring SPO2 with pulse oximeter and demonstrate accurate use of the pulse oximeter.;To learn and understand importance of maintaining oxygen saturations>88%;To learn and demonstrate proper pursed lip breathing techniques or other breathing techniques.;To learn and demonstrate proper use of respiratory medications  To learn and exhibit compliance with exercise, home and travel O2 prescription;To learn and  understand importance of monitoring SPO2 with pulse oximeter and demonstrate accurate use of the pulse oximeter.;To learn and understand importance of maintaining oxygen saturations>88%;To learn and demonstrate proper pursed lip breathing techniques or other breathing techniques.;To learn and demonstrate proper use of respiratory medications     Long  Term Goals  Exhibits compliance with exercise, home and travel O2 prescription;Verbalizes importance of monitoring SPO2 with pulse oximeter and return demonstration;Maintenance of O2 saturations>88%;Exhibits proper breathing techniques, such as pursed lip breathing or other method taught during program session;Compliance with respiratory medication  Exhibits compliance with exercise, home and travel O2 prescription;Verbalizes importance of monitoring SPO2 with pulse oximeter and return demonstration;Maintenance of O2 saturations>88%;Exhibits proper breathing techniques, such as pursed lip breathing or other method taught during program session;Compliance with respiratory medication  Exhibits compliance with exercise, home and travel O2 prescription;Verbalizes importance of monitoring SPO2 with pulse oximeter and return demonstration;Maintenance of O2 saturations>88%;Exhibits proper breathing techniques, such as pursed lip breathing or other method taught during program session;Compliance with respiratory medication     Goals/Expected Outcomes  compliance  compliance  compliance        Oxygen Discharge (Final Oxygen Re-Evaluation): Oxygen Re-Evaluation - 03/06/19 1031      Program Oxygen Prescription   Program Oxygen Prescription  None      Home Oxygen   Home Oxygen Device  None    Sleep Oxygen Prescription  None    Home Exercise Oxygen Prescription  None    Home at Rest Exercise Oxygen Prescription  None    Compliance with Home Oxygen Use  Yes      Goals/Expected Outcomes   Short Term Goals  To learn and exhibit compliance with exercise, home  and travel O2 prescription;To learn and understand importance of monitoring SPO2 with pulse oximeter and demonstrate accurate use of the pulse oximeter.;To learn and understand importance of maintaining oxygen saturations>88%;To learn and demonstrate proper pursed lip breathing techniques or other breathing techniques.;To learn and demonstrate proper use of respiratory medications    Long  Term Goals  Exhibits compliance with exercise, home and travel O2 prescription;Verbalizes importance of  monitoring SPO2 with pulse oximeter and return demonstration;Maintenance of O2 saturations>88%;Exhibits proper breathing techniques, such as pursed lip breathing or other method taught during program session;Compliance with respiratory medication    Goals/Expected Outcomes  compliance       Initial Exercise Prescription: Initial Exercise Prescription - 12/27/18 1600      Date of Initial Exercise RX and Referring Provider   Date  12/27/18    Referring Provider  [name]      Treadmill   MPH  2.2    Grade  1    Minutes  15      NuStep   Level  3    SPM  80    Minutes  15      Prescription Details   Frequency (times per week)  2    Duration  Progress to 30 minutes of continuous aerobic without signs/symptoms of physical distress      Intensity   THRR 40-80% of Max Heartrate  56-112    Ratings of Perceived Exertion  11-13    Perceived Dyspnea  0-4      Progression   Progression  Continue to progress workloads to maintain intensity without signs/symptoms of physical distress.      Resistance Training   Training Prescription  Yes    Weight  orange bands    Reps  10-15       Perform Capillary Blood Glucose checks as needed.  Exercise Prescription Changes: Exercise Prescription Changes    Row Name 09/26/18 1500 10/12/18 1339 11/09/18 1500 01/05/19 1512 01/17/19 1400     Response to Exercise   Blood Pressure (Admit)  124/56  122/46  116/62  132/56  --   Blood Pressure (Exercise)   130/72  142/62  148/72  118/66  --   Blood Pressure (Exit)  120/62  122/60  108/62  112/50  --   Heart Rate (Admit)  85 bpm  82 bpm  75 bpm  81 bpm  --   Heart Rate (Exercise)  102 bpm  94 bpm  114 bpm  100 bpm  --   Heart Rate (Exit)  81 bpm  86 bpm  78 bpm  75 bpm  --   Oxygen Saturation (Admit)  --  --  --  98 %  --   Oxygen Saturation (Exercise)  --  --  --  98 %  --   Oxygen Saturation (Exit)  --  --  --  99 %  --   Rating of Perceived Exertion (Exercise)  _0 --   Perceived Dyspnea (Exercise)  0  0  0  1  --   Symptoms  None  None  None  none  --   Comments  Pt's first day of exercise since closure  --  Pt graduated CR program today.   --  --   Duration  Progress to 30 minutes of  aerobic without signs/symptoms of physical distress  Continue with 30 min of aerobic exercise without signs/symptoms of physical distress.  Continue with 30 min of aerobic exercise without signs/symptoms of physical distress.  Continue with 30 min of aerobic exercise without signs/symptoms of physical distress.  --   Intensity  THRR unchanged  THRR unchanged  THRR unchanged  THRR unchanged  --     Progression   Progression  Continue to progress workloads to maintain intensity without signs/symptoms of physical distress.  Continue to progress workloads to maintain intensity without  signs/symptoms of physical distress.  Continue to progress workloads to maintain intensity without signs/symptoms of physical distress.  Continue to progress workloads to maintain intensity without signs/symptoms of physical distress.  --   Average METs  2.3  2.9  2.9  --  --     Resistance Training   Training Prescription  Yes  No Relaxation day, no weights.  No Relaxation day, no weights.  Yes  --   Weight  3lbs  --  --  orange bands  --   Reps  10-15  --  --  10-15  --   Time  10 Minutes  --  --  10 Minutes  --     Interval Training   Interval Training  --  No  No  No  --     Treadmill   MPH  --  --  --  2.2  --    Grade  --  --  --  1  --   Minutes  --  --  --  15  --     Recumbant Bike   Level  1._0 --  --   [place]atts  --  23  23  --  --   Minutes  _1 --  --   METs  1.9  3.05  2.3  --  --     NuStep   Level  _2 --   SPM  85  85  85  80  --   Minutes  _3 --   METs  2.7  2.7  3.4  2.7  --     Home Exercise Plan   Plans to continue exercise at  Home (comment) Walking  Home (comment) Walking  Home (comment) Walking  --  Home (comment)   Frequency  Add 3 additional days to program exercise sessions.  Add 4 additional days to program exercise sessions.  Add 4 additional days to program exercise sessions.  --  Add 2 additional days to program exercise sessions.   Initial Home Exercises Provided  06/07/18  06/07/18  06/07/18  --  01/17/19   Row Name 01/19/19 1444 02/07/19 1500 02/21/19 1400         Response to Exercise   Blood Pressure (Admit)  136/62  128/60  134/60     Blood Pressure (Exercise)  140/66  122/60  144/70     Blood Pressure (Exit)  114/60  128/64  110/58     Heart Rate (Admit)  83 bpm  87 bpm  83 bpm     Heart Rate (Exercise)  94 bpm  97 bpm  99 bpm     Heart Rate (Exit)  90 bpm  86 bpm  85 bpm     Oxygen Saturation (Admit)  96 %  97 %  100 %     Oxygen Saturation (Exercise)  96 %  96 %  98 %     Oxygen Saturation (Exit)  98 %  97 %  98 %     Rating of Perceived Exertion (Exercise)  _4 Perceived Dyspnea (Exercise)  _5 Duration  Continue with 30 min of aerobic exercise without signs/symptoms of physical distress.  Continue with 30 min of aerobic exercise without signs/symptoms of physical distress.  Continue with  30 min of aerobic exercise without signs/symptoms of physical distress.     Intensity  THRR unchanged  THRR unchanged  THRR unchanged       Progression   Progression  --  Continue to progress workloads to maintain intensity without signs/symptoms of physical distress.  Continue to progress workloads to maintain  intensity without signs/symptoms of physical distress.       Resistance Training   Training Prescription  Yes  Yes  Yes     Weight  orange bands  orange bands  orange bands     Reps  10-15  10-15  10-15     Time  10 Minutes  10 Minutes  10 Minutes       Treadmill   MPH  2.4  2.4  2.4     Grade  _0 Minutes  _1 elevation decreased due to no exercise recently       NuStep   Level  _2 SPM  80  80  80     Minutes  _3 METs  2.8  3  2.6        Exercise Comments: Exercise Comments    Row Name 09/26/18 1528 10/12/18 1400 11/09/18 1519 01/17/19 1436     Exercise Comments  Pt's first day of exercise since departmental closure due to COVID 19. Pt tolerated exercise well. Will continue to monitor pt's progress.  Reviewed goals with patient. Pt is walking and doing resistance training at home in addition to exercise at cardiac rehab.  Pt graduated CR program today. Pt will continue walking at home for exercise.  home exercise complete       Exercise Goals and Review: Exercise Goals    Row Name 12/27/18 1606 01/10/19 1555 02/07/19 0711 03/06/19 1032       Exercise Goals   Increase Physical Activity  Yes  Yes  Yes  Yes    Intervention  Provide advice, education, support and counseling about physical activity/exercise needs.;Develop an individualized exercise prescription for aerobic and resistive training based on initial evaluation findings, risk stratification, comorbidities and participant's personal goals.  Provide advice, education, support and counseling about physical activity/exercise needs.;Develop an individualized exercise prescription for aerobic and resistive training based on initial evaluation findings, risk stratification, comorbidities and participant's personal goals.  Provide advice, education, support and counseling about physical activity/exercise needs.;Develop an individualized exercise prescription for aerobic and resistive training  based on initial evaluation findings, risk stratification, comorbidities and participant's personal goals.  Provide advice, education, support and counseling about physical activity/exercise needs.;Develop an individualized exercise prescription for aerobic and resistive training based on initial evaluation findings, risk stratification, comorbidities and participant's personal goals.    Expected Outcomes  Short Term: Attend rehab on a regular basis to increase amount of physical activity.;Long Term: Exercising regularly at least 3-5 days a week.;Long Term: Add in home exercise to make exercise part of routine and to increase amount of physical activity.  Short Term: Attend rehab on a regular basis to increase amount of physical activity.;Long Term: Exercising regularly at least 3-5 days a week.;Long Term: Add in home exercise to make exercise part of routine and to increase amount of physical activity.  Short Term: Attend rehab on a regular basis to increase amount of physical activity.;Long Term: Exercising regularly at least 3-5 days a week.;Long Term: Add  in home exercise to make exercise part of routine and to increase amount of physical activity.  Short Term: Attend rehab on a regular basis to increase amount of physical activity.;Long Term: Exercising regularly at least 3-5 days a week.;Long Term: Add in home exercise to make exercise part of routine and to increase amount of physical activity.    Increase Strength and Stamina  Yes  Yes  Yes  Yes    Intervention  Provide advice, education, support and counseling about physical activity/exercise needs.;Develop an individualized exercise prescription for aerobic and resistive training based on initial evaluation findings, risk stratification, comorbidities and participant's personal goals.  Provide advice, education, support and counseling about physical activity/exercise needs.;Develop an individualized exercise prescription for aerobic and resistive  training based on initial evaluation findings, risk stratification, comorbidities and participant's personal goals.  Provide advice, education, support and counseling about physical activity/exercise needs.;Develop an individualized exercise prescription for aerobic and resistive training based on initial evaluation findings, risk stratification, comorbidities and participant's personal goals.  Provide advice, education, support and counseling about physical activity/exercise needs.;Develop an individualized exercise prescription for aerobic and resistive training based on initial evaluation findings, risk stratification, comorbidities and participant's personal goals.    Expected Outcomes  Short Term: Increase workloads from initial exercise prescription for resistance, speed, and METs.;Short Term: Perform resistance training exercises routinely during rehab and add in resistance training at home;Long Term: Improve cardiorespiratory fitness, muscular endurance and strength as measured by increased METs and functional capacity (6MWT)  Short Term: Increase workloads from initial exercise prescription for resistance, speed, and METs.;Short Term: Perform resistance training exercises routinely during rehab and add in resistance training at home;Long Term: Improve cardiorespiratory fitness, muscular endurance and strength as measured by increased METs and functional capacity (6MWT)  Short Term: Increase workloads from initial exercise prescription for resistance, speed, and METs.;Short Term: Perform resistance training exercises routinely during rehab and add in resistance training at home;Long Term: Improve cardiorespiratory fitness, muscular endurance and strength as measured by increased METs and functional capacity (6MWT)  Short Term: Increase workloads from initial exercise prescription for resistance, speed, and METs.;Short Term: Perform resistance training exercises routinely during rehab and add in resistance  training at home;Long Term: Improve cardiorespiratory fitness, muscular endurance and strength as measured by increased METs and functional capacity (6MWT)    Able to understand and use rate of perceived exertion (RPE) scale  Yes  Yes  Yes  Yes    Intervention  Provide education and explanation on how to use RPE scale  Provide education and explanation on how to use RPE scale  Provide education and explanation on how to use RPE scale  Provide education and explanation on how to use RPE scale    Expected Outcomes  Short Term: Able to use RPE daily in rehab to express subjective intensity level;Long Term:  Able to use RPE to guide intensity level when exercising independently  Short Term: Able to use RPE daily in rehab to express subjective intensity level;Long Term:  Able to use RPE to guide intensity level when exercising independently  Short Term: Able to use RPE daily in rehab to express subjective intensity level;Long Term:  Able to use RPE to guide intensity level when exercising independently  Short Term: Able to use RPE daily in rehab to express subjective intensity level;Long Term:  Able to use RPE to guide intensity level when exercising independently    Able to understand and use Dyspnea scale  Yes  Yes  Yes  Yes    Intervention  Provide education and explanation on how to use Dyspnea scale  Provide education and explanation on how to use Dyspnea scale  Provide education and explanation on how to use Dyspnea scale  Provide education and explanation on how to use Dyspnea scale    Expected Outcomes  Short Term: Able to use Dyspnea scale daily in rehab to express subjective sense of shortness of breath during exertion;Long Term: Able to use Dyspnea scale to guide intensity level when exercising independently  Short Term: Able to use Dyspnea scale daily in rehab to express subjective sense of shortness of breath during exertion;Long Term: Able to use Dyspnea scale to guide intensity level when exercising  independently  Short Term: Able to use Dyspnea scale daily in rehab to express subjective sense of shortness of breath during exertion;Long Term: Able to use Dyspnea scale to guide intensity level when exercising independently  Short Term: Able to use Dyspnea scale daily in rehab to express subjective sense of shortness of breath during exertion;Long Term: Able to use Dyspnea scale to guide intensity level when exercising independently    Knowledge and understanding of Target Heart Rate Range (THRR)  Yes  Yes  Yes  Yes    Intervention  Provide education and explanation of THRR including how the numbers were predicted and where they are located for reference  Provide education and explanation of THRR including how the numbers were predicted and where they are located for reference  Provide education and explanation of THRR including how the numbers were predicted and where they are located for reference  Provide education and explanation of THRR including how the numbers were predicted and where they are located for reference    Expected Outcomes  Long Term: Able to use THRR to govern intensity when exercising independently;Short Term: Able to state/look up THRR;Short Term: Able to use daily as guideline for intensity in rehab  Long Term: Able to use THRR to govern intensity when exercising independently;Short Term: Able to state/look up THRR;Short Term: Able to use daily as guideline for intensity in rehab  Long Term: Able to use THRR to govern intensity when exercising independently;Short Term: Able to state/look up THRR;Short Term: Able to use daily as guideline for intensity in rehab  Long Term: Able to use THRR to govern intensity when exercising independently;Short Term: Able to state/look up THRR;Short Term: Able to use daily as guideline for intensity in rehab    Understanding of Exercise Prescription  Yes  Yes  Yes  Yes    Intervention  Provide education, explanation, and written materials on patient's  individual exercise prescription  Provide education, explanation, and written materials on patient's individual exercise prescription  Provide education, explanation, and written materials on patient's individual exercise prescription  Provide education, explanation, and written materials on patient's individual exercise prescription    Expected Outcomes  Short Term: Able to explain program exercise prescription;Long Term: Able to explain home exercise prescription to exercise independently  Short Term: Able to explain program exercise prescription;Long Term: Able to explain home exercise prescription to exercise independently  Short Term: Able to explain program exercise prescription;Long Term: Able to explain home exercise prescription to exercise independently  Short Term: Able to explain program exercise prescription;Long Term: Able to explain home exercise prescription to exercise independently       Exercise Goals Re-Evaluation : Exercise Goals Re-Evaluation    Row Name 10/12/18 1400 11/09/18 1517 01/10/19 1555 02/07/19 0711 03/06/19 1032  Exercise Goal Re-Evaluation   Exercise Goals Review  Increase Physical Activity;Able to understand and use rate of perceived exertion (RPE) scale;Knowledge and understanding of Target Heart Rate Range (THRR);Understanding of Exercise Prescription;Increase Strength and Stamina  Increase Physical Activity;Understanding of Exercise Prescription;Increase Strength and Stamina;Knowledge and understanding of Target Heart Rate Range (THRR);Able to understand and use rate of perceived exertion (RPE) scale;Able to check pulse independently  Increase Physical Activity;Increase Strength and Stamina;Able to understand and use rate of perceived exertion (RPE) scale;Able to understand and use Dyspnea scale;Knowledge and understanding of Target Heart Rate Range (THRR);Understanding of Exercise Prescription  Increase Physical Activity;Increase Strength and Stamina;Able to  understand and use rate of perceived exertion (RPE) scale;Able to understand and use Dyspnea scale;Knowledge and understanding of Target Heart Rate Range (THRR);Understanding of Exercise Prescription  Increase Physical Activity;Increase Strength and Stamina;Able to understand and use rate of perceived exertion (RPE) scale;Able to understand and use Dyspnea scale;Knowledge and understanding of Target Heart Rate Range (THRR);Understanding of Exercise Prescription   Comments  Patient is walking at least 20-30 minutes 5-7 days/week. Pt also has 10lb weights at home that he uses for his resistance training.  Pt graduated CR program today. Pt tolerated exercise prescritptions well and increased resistance on all exercises gradually through program. Pt is walking 30 minutes poer day weather permitting and plans to continue to do so. Pt understands goals going forward with graduating.  Pt has completed 2 exercise session. Pt had previously finished cardiac rehab a couple of months prior. Pt is motivated to increase his workloads. Pt exercises at 2.7 METs on the stepper. Will continue to monitor and progress as able.  Pt has completed 7 exercise sessions. Pt currently exercises at 2.7 METs on the stepper. Pt has been able to increase his workloads on both the stepper and treadmill. Will continue to monitor and progress as able.  Pt has completed 9 exercise sessions. Pt has missed several visits recently due to taking care of his wife who just had surgery. This has greatly fatigued him. Pt last exercised at 2.6 METs on the stepper.   Expected Outcomes  Increase workloads as tolerated to help increase strength and stamina.  Pt will continue exercising at home after graduating.  Through exercise at rehab and at home, the patient will decrease shortness of breath with daily activities and feel confident in carrying out an exercise regime at home.  Through exercise at rehab and at home, the patient will decrease shortness of  breath with daily activities and feel confident in carrying out an exercise regime at home.  Through exercise at rehab and at home, the patient will decrease shortness of breath with daily activities and feel confident in carrying out an exercise regime at home.      Discharge Exercise Prescription (Final Exercise Prescription Changes): Exercise Prescription Changes - 02/21/19 1400      Response to Exercise   Blood Pressure (Admit)  134/60    Blood Pressure (Exercise)  144/70    Blood Pressure (Exit)  110/58    Heart Rate (Admit)  83 bpm    Heart Rate (Exercise)  99 bpm    Heart Rate (Exit)  85 bpm    Oxygen Saturation (Admit)  100 %    Oxygen Saturation (Exercise)  98 %    Oxygen Saturation (Exit)  98 %    Rating of Perceived Exertion (Exercise)  13    Perceived Dyspnea (Exercise)  2    Duration  Continue with 30 min  of aerobic exercise without signs/symptoms of physical distress.    Intensity  THRR unchanged      Progression   Progression  Continue to progress workloads to maintain intensity without signs/symptoms of physical distress.      Resistance Training   Training Prescription  Yes    Weight  orange bands    Reps  10-15    Time  10 Minutes      Treadmill   MPH  2.4    Grade  3    Minutes  1   elevation decreased due to no exercise recently     NuStep   Level  4    SPM  80    Minutes  15    METs  2.6       Nutrition:  Target Goals: Understanding of nutrition guidelines, daily intake of sodium <1573m, cholesterol <2086m calories 30% from fat and 7% or less from saturated fats, daily to have 5 or more servings of fruits and vegetables.  Biometrics: Pre Biometrics - 12/27/18 1519      Pre Biometrics   Height  _0  (1.778 m)  (Pended)       Post Biometrics - 11/09/18 1509       Post  Biometrics   Height  _1  (1.753 m)    Weight  68.4 kg    Waist Circumference  32 inches    Hip Circumference  37.5 inches    Waist to Hip Ratio  0.85 %    BMI  (Calculated)  22.26    Triceps Skinfold  18 mm    % Body Fat  22.9 %    Grip Strength  40 kg    Flexibility  0 in    Single Leg Stand  3.18 seconds       Nutrition Therapy Plan and Nutrition Goals:   Nutrition Assessments:   Nutrition Goals Re-Evaluation:   Nutrition Goals Discharge (Final Nutrition Goals Re-Evaluation):   Psychosocial: Target Goals: Acknowledge presence or absence of significant depression and/or stress, maximize coping skills, provide positive support system. Participant is able to verbalize types and ability to use techniques and skills needed for reducing stress and depression.  Initial Review & Psychosocial Screening: Initial Psych Review & Screening - 12/27/18 1545      Initial Review   Current issues with  Current Stress Concerns    Source of Stress Concerns  Chronic Illness    Comments  ejection fraction 10-15% and patient is struggling with end of life issues.      Family Dynamics   Good Support System?  Yes      Barriers   Psychosocial barriers to participate in program  There are no identifiable barriers or psychosocial needs.   patient is handling end of life issues well, considering counselling     Screening Interventions   Interventions  Encouraged to exercise   suggested counselling tp patient for him and spouse      Quality of Life Scores: Quality of Life - 11/09/18 1529      Quality of Life   Select  Quality of Life      Quality of Life Scores   Health/Function Pre  18.2 %    Socioeconomic Pre  27.33 %    Psych/Spiritual Pre  24.75 %    Family Pre  24 %    GLOBAL Pre  22.05 %      Scores of 19 and below usually indicate a poorer quality of  life in these areas.  A difference of  2-3 points is a clinically meaningful difference.  A difference of 2-3 points in the total score of the Quality of Life Index has been associated with significant improvement in overall quality of life, self-image, physical symptoms, and general  health in studies assessing change in quality of life.  PHQ-9: Recent Review Flowsheet Data    Depression screen [facility] 2/9 12/27/2018 11/09/2018 09/27/2018 03/29/2018 01/19/2018   Decreased Interest 0 0 0 0 0   Down, Depressed, Hopeless 0 0 0 0 0   PHQ - 2 Score 0 0 0 0 0   Altered sleeping 2 - - - -   Tired, decreased energy 3 - - - -   Change in appetite 1 - - - -   Trouble concentrating 0 - - - -   Moving slowly or fidgety/restless 0 - - - -   Suicidal thoughts 0 - - - -   Difficult doing work/chores Not difficult at all - - - -     Interpretation of Total Score  Total Score Depression Severity:  1-4 = Minimal depression, 5-9 = Mild depression, 10-14 = Moderate depression, 15-19 = Moderately severe depression, 20-27 = Severe depression   Psychosocial Evaluation and Intervention: Psychosocial Evaluation - 12/27/18 1547      Psychosocial Evaluation & Interventions   Interventions  Encouraged to exercise with the program and follow exercise prescription;Stress management education    Comments  Struggling with end of life questions and decisions    Expected Outcomes  Develop healthy coping mechanisms to deal with stress    Continue Psychosocial Services   Follow up required by staff       Psychosocial Re-Evaluation: Psychosocial Re-Evaluation    Row Name 10/13/18 1555 12/27/18 1550 01/11/19 1211 02/06/19 1140 03/06/19 1203     Psychosocial Re-Evaluation   Current issues with  None Identified  Current Stress Concerns  Current Stress Concerns  Current Stress Concerns  Current Stress Concerns;Current Anxiety/Panic   Comments  no psychosocial needs identified, no interventions necessary  --  Patient is dealing with new decrease in ejection fraction to 10-15%, have discussed seeing a counsellor for he and his wife to work out end of life issues  Kayson has a positive outlook despite his severe heart failure.  His wife is to have foot surgury in the near future and he will be the sole  caregiver.  Blayton has been taking care of his wife who had extensive foot surgery, this is a source of stress, he has help from his son and a care giver that comes at night to assist.  He also is dealing with the stress of having a chronic illness.   Expected Outcomes  pt will exhibit positive outlook with good coping skills.   --  --  For Ehab to use healthy coping mechanisms for his stressors in his life.  That Masson is handling current stressors in a healthy ways.   Interventions  Encouraged to attend Cardiac Rehabilitation for the exercise  --  Stress management education;Encouraged to attend Pulmonary Rehabilitation for the exercise;Relaxation education  Stress management education;Encouraged to attend Pulmonary Rehabilitation for the exercise  Encouraged to attend Pulmonary Rehabilitation for the exercise;Relaxation education;Stress management education   Continue Psychosocial Services   No Follow up required  --  Follow up required by staff  No Follow up required  Follow up required by staff   Comments  --  --  Is coping well  at this time, but patient's wife is not with patient's end of life issues  Handling stressors well.  Is dealing with the stress of taking care of his wife who recently had extensive foot surgery.     Initial Review   Source of Stress Concerns  --  --  Chronic Illness;Unable to participate in former interests or hobbies;Unable to perform yard/household activities  Chronic Illness;Family  Chronic Illness;Family;Unable to perform yard/household activities      Psychosocial Discharge (Final Psychosocial Re-Evaluation): Psychosocial Re-Evaluation - 03/06/19 1203      Psychosocial Re-Evaluation   Current issues with  Current Stress Concerns;Current Anxiety/Panic    Comments  Masahiro has been taking care of his wife who had extensive foot surgery, this is a source of stress, he has help from his son and a care giver that comes at night to assist.  He also is dealing with the  stress of having a chronic illness.    Expected Outcomes  That Tasha is handling current stressors in a healthy ways.    Interventions  Encouraged to attend Pulmonary Rehabilitation for the exercise;Relaxation education;Stress management education    Continue Psychosocial Services   Follow up required by staff    Comments  Is dealing with the stress of taking care of his wife who recently had extensive foot surgery.      Initial Review   Source of Stress Concerns  Chronic Illness;Family;Unable to perform yard/household activities       Education: Education Goals: Education classes will be provided on a weekly basis, covering required topics. Participant will state understanding/return demonstration of topics presented.  Learning Barriers/Preferences: Learning Barriers/Preferences - 12/27/18 1550      Learning Barriers/Preferences   Learning Barriers  None    Learning Preferences  Audio;Computer/Internet;Group Instruction;Individual Instruction;Pictoral;Skilled Demonstration;Verbal Instruction;Video;Written Material       Education Topics: Risk Factor Reduction:  -Group instruction that is supported by a PowerPoint presentation. Instructor discusses the definition of a risk factor, different risk factors for pulmonary disease, and how the heart and lungs work together.     Nutrition for Pulmonary Patient:  -Group instruction provided by PowerPoint slides, verbal discussion, and written materials to support subject matter. The instructor gives an explanation and review of healthy diet recommendations, which includes a discussion on weight management, recommendations for fruit and vegetable consumption, as well as protein, fluid, caffeine, fiber, sodium, sugar, and alcohol. Tips for eating when patients are short of breath are discussed.   Pursed Lip Breathing:  -Group instruction that is supported by demonstration and informational handouts. Instructor discusses the benefits of pursed  lip and diaphragmatic breathing and detailed demonstration on how to preform both.     Oxygen Safety:  -Group instruction provided by PowerPoint, verbal discussion, and written material to support subject matter. There is an overview of "What is Oxygen" and "Why do we need it".  Instructor also reviews how to create a safe environment for oxygen use, the importance of using oxygen as prescribed, and the risks of noncompliance. There is a brief discussion on traveling with oxygen and resources the patient may utilize.   Oxygen Equipment:  -Group instruction provided by [facility] Staff utilizing handouts, written materials, and equipment demonstrations.   Signs and Symptoms:  -Group instruction provided by written material and verbal discussion to support subject matter. Warning signs and symptoms of infection, stroke, and heart attack are reviewed and when to call the physician/911 reinforced. Tips for preventing the spread of infection discussed.  Advanced Directives:  -Group instruction provided by verbal instruction and written material to support subject matter. Instructor reviews Advanced Directive laws and proper instruction for filling out document.   Pulmonary Video:  -Group video education that reviews the importance of medication and oxygen compliance, exercise, good nutrition, pulmonary hygiene, and pursed lip and diaphragmatic breathing for the pulmonary patient.   Exercise for the Pulmonary Patient:  -Group instruction that is supported by a PowerPoint presentation. Instructor discusses benefits of exercise, core components of exercise, frequency, duration, and intensity of an exercise routine, importance of utilizing pulse oximetry during exercise, safety while exercising, and options of places to exercise outside of rehab.     Pulmonary Medications:  -Verbally interactive group education provided by instructor with focus on inhaled medications and proper  administration.   Anatomy and Physiology of the Respiratory System and Intimacy:  -Group instruction provided by PowerPoint, verbal discussion, and written material to support subject matter. Instructor reviews respiratory cycle and anatomical components of the respiratory system and their functions. Instructor also reviews differences in obstructive and restrictive respiratory diseases with examples of each. Intimacy, Sex, and Sexuality differences are reviewed with a discussion on how relationships can change when diagnosed with pulmonary disease. Common sexual concerns are reviewed.   PULMONARY REHAB OTHER RESPIRATORY from 02/21/2019 in [place]  Date  02/21/19  Educator  -- [Handout]      MD DAY -A group question and answer session with a medical doctor that allows participants to ask questions that relate to their pulmonary disease state.   OTHER EDUCATION -Group or individual verbal, written, or video instructions that support the educational goals of the pulmonary rehab program.   Holiday Eating Survival Tips:  -Group instruction provided by PowerPoint slides, verbal discussion, and written materials to support subject matter. The instructor gives patients tips, tricks, and techniques to help them not only survive but enjoy the holidays despite the onslaught of food that accompanies the holidays.   PULMONARY REHAB OTHER RESPIRATORY from 02/21/2019 in [place]  Date  02/07/19  Educator  -- [Handout]      Knowledge Questionnaire Score: Knowledge Questionnaire Score - 12/27/18 1551      Knowledge Questionnaire Score   Pre Score  14/18       Core Components/Risk Factors/Patient Goals at Admission: Personal Goals and Risk Factors at Admission - 12/27/18 1551      Core Components/Risk Factors/Patient Goals on Admission   Improve shortness of breath with ADL's  Yes    Heart Failure  Yes    Intervention  --    monitor weights and medication compliance   Expected Outcomes  Improve functional capacity of life;Short term: Attendance in program 2-3 days a week with increased exercise capacity. Reported lower sodium intake. Reported increased fruit and vegetable intake. Reports medication compliance.;Short term: Daily weights obtained and reported for increase. Utilizing diuretic protocols set by physician.;Long term: Adoption of self-care skills and reduction of barriers for early signs and symptoms recognition and intervention leading to self-care maintenance.    Stress  Yes    Intervention  Offer individual and/or small group education and counseling on adjustment to heart disease, stress management and health-related lifestyle change. Teach and support self-help strategies.;Refer participants experiencing significant psychosocial distress to appropriate mental health specialists for further evaluation and treatment. When possible, include family members and significant others in education/counseling sessions.    Expected Outcomes  Short Term: Participant demonstrates changes in health-related  behavior, relaxation and other stress management skills, ability to obtain effective social support, and compliance with psychotropic medications if prescribed.;Long Term: Emotional wellbeing is indicated by absence of clinically significant psychosocial distress or social isolation.       Core Components/Risk Factors/Patient Goals Review:  Goals and Risk Factor Review    Row Name 10/13/18 1555 11/10/18 1525 12/27/18 1552 01/11/19 1214 02/06/19 1145     Core Components/Risk Factors/Patient Goals Review   Personal Goals Review  Improve shortness of breath with ADL's;Hypertension;Lipids;Stress  Improve shortness of breath with ADL's;Hypertension;Lipids;Stress  Stress;Improve shortness of breath with ADL's;Heart Failure;Develop more efficient breathing techniques such as purse lipped breathing and diaphragmatic breathing  and practicing self-pacing with activity.  Heart Failure;Improve shortness of breath with ADL's;Develop more efficient breathing techniques such as purse lipped breathing and diaphragmatic breathing and practicing self-pacing with activity.  Heart Failure;Develop more efficient breathing techniques such as purse lipped breathing and diaphragmatic breathing and practicing self-pacing with activity.;Improve shortness of breath with ADL's   Review  Sun has been doing well with exercise since cardiac rehab restarted. Trashaun vital signs have been stable  Zamier has been doing well with exercise since cardiac rehab restarted. Zylan vital signs have been stable. Adilson graduated on 11/10/18 and plans to continue exercising by walking weather permitting.  --  Just started program, too early to see progression towards goals  Heart failure is stable, he is a pleasure to have in the program, exercising on level 4 of the nustep and 2.4 mph and 2% incline on the treadmill.   Expected Outcomes  pt will participate in exercise, nutrition and lifestyle modification to decrease disease progress and improved quality of life  Patient will continue to exercise upon discharge from cardiac rehab continue to follow nutrition and lifestyle modifications.  --  See admission goals  See admission goals.   Bryan Name 03/06/19 1208             Core Components/Risk Factors/Patient Goals Review   Personal Goals Review  Heart Failure;Improve shortness of breath with ADL's;Develop more efficient breathing techniques such as purse lipped breathing and diaphragmatic breathing and practicing self-pacing with activity.;Stress       Review  Has been absent d/t taking care of his wife which is a stressor, heart failure is stable, has become somewhat deconditioned d/t absences.       Expected Outcomes  That Lazer will be able to participate in pulmonary rehab to improve deconditioning when his wife's condition improves.          Core  Components/Risk Factors/Patient Goals at Discharge (Final Review):  Goals and Risk Factor Review - 03/06/19 1208      Core Components/Risk Factors/Patient Goals Review   Personal Goals Review  Heart Failure;Improve shortness of breath with ADL's;Develop more efficient breathing techniques such as purse lipped breathing and diaphragmatic breathing and practicing self-pacing with activity.;Stress    Review  Has been absent d/t taking care of his wife which is a stressor, heart failure is stable, has become somewhat deconditioned d/t absences.    Expected Outcomes  That Brice will be able to participate in pulmonary rehab to improve deconditioning when his wife's condition improves.       ITP Comments: ITP Comments    Row Name 10/13/18 1600           ITP Comments  30 Day ITP Review. Patient has been with good participation and attendance since cardiac rehab resumed  Comments: ITP REVIEW Pt is making expected progress toward pulmonary rehab goals after completing 9 sessions. Recommend continued exercise, life style modification, education, and utilization of breathing techniques to increase stamina and strength and decrease shortness of breath with exertion.

## 2019-03-07 NOTE — Telephone Encounter (Signed)
Patient left VM stating he read that urine retention was a side effect of entresto and he has been experiencing this side effect. Patient requests a call from Elma to discuss adjusting some of his medications so that he can stay on entresto.    Routed to [place]

## 2019-03-07 NOTE — Telephone Encounter (Signed)
Would be very unusual.  If he was not getting it with valsartan, he should not with Entresto.  Would tell him not to change for now.   Will have Lauren give him a call to go over his meds.

## 2019-03-07 NOTE — Telephone Encounter (Signed)
Spoke with Caleb [name] about his concern than [name] is causing urinary retention. I agree that since he was stable on valsartan previously, it is unlikely that [name] is causing the urinary retention, especially given his recent difficulties with volume status (requiring increasing doses of torsemide and a dose of metolazone). Additionally, he does have BPH which could be contributing. He will contact his urologist to see if the BPH could be causing some of his symptoms. He will continue the [facility] for now.

## 2019-03-09 ENCOUNTER — Ambulatory Visit (HOSPITAL_COMMUNITY)
Admission: RE | Admit: 2019-03-09 | Discharge: 2019-03-09 | Disposition: A | Discharge status: Home / Self Care | Payer: Medicare Other | Source: Ambulatory Visit | Attending: Cardiology | Admitting: Cardiology

## 2019-03-09 ENCOUNTER — Encounter (HOSPITAL_COMMUNITY)
Admission: RE | Admit: 2019-03-09 | Discharge: 2019-03-09 | Disposition: A | Discharge status: Home / Self Care | Payer: Medicare Other | Source: Ambulatory Visit | Attending: Cardiology | Admitting: Cardiology

## 2019-03-09 ENCOUNTER — Ambulatory Visit (HOSPITAL_COMMUNITY): Payer: Medicare Other

## 2019-03-09 ENCOUNTER — Other Ambulatory Visit: Payer: Self-pay

## 2019-03-09 ENCOUNTER — Encounter (HOSPITAL_COMMUNITY): Payer: Self-pay | Admitting: Cardiology

## 2019-03-09 VITALS — BP 120/44 | HR 69 | Wt 141.8 lb

## 2019-03-09 DIAGNOSIS — I480 Paroxysmal atrial fibrillation: Secondary | ICD-10-CM

## 2019-03-09 DIAGNOSIS — I272 Pulmonary hypertension, unspecified: Secondary | ICD-10-CM

## 2019-03-09 DIAGNOSIS — Z9889 Other specified postprocedural states: Secondary | ICD-10-CM | POA: Diagnosis not present

## 2019-03-09 DIAGNOSIS — Z8673 Personal history of transient ischemic attack (TIA), and cerebral infarction without residual deficits: Secondary | ICD-10-CM | POA: Insufficient documentation

## 2019-03-09 DIAGNOSIS — Z8249 Family history of ischemic heart disease and other diseases of the circulatory system: Secondary | ICD-10-CM | POA: Insufficient documentation

## 2019-03-09 DIAGNOSIS — E785 Hyperlipidemia, unspecified: Secondary | ICD-10-CM | POA: Insufficient documentation

## 2019-03-09 DIAGNOSIS — G629 Polyneuropathy, unspecified: Secondary | ICD-10-CM | POA: Diagnosis not present

## 2019-03-09 DIAGNOSIS — I2721 Secondary pulmonary arterial hypertension: Secondary | ICD-10-CM | POA: Diagnosis not present

## 2019-03-09 DIAGNOSIS — Z951 Presence of aortocoronary bypass graft: Secondary | ICD-10-CM | POA: Diagnosis not present

## 2019-03-09 DIAGNOSIS — R001 Bradycardia, unspecified: Secondary | ICD-10-CM | POA: Insufficient documentation

## 2019-03-09 DIAGNOSIS — I484 Atypical atrial flutter: Secondary | ICD-10-CM | POA: Insufficient documentation

## 2019-03-09 DIAGNOSIS — I252 Old myocardial infarction: Secondary | ICD-10-CM | POA: Insufficient documentation

## 2019-03-09 DIAGNOSIS — R55 Syncope and collapse: Secondary | ICD-10-CM | POA: Insufficient documentation

## 2019-03-09 DIAGNOSIS — Z79899 Other long term (current) drug therapy: Secondary | ICD-10-CM | POA: Insufficient documentation

## 2019-03-09 DIAGNOSIS — Z7982 Long term (current) use of aspirin: Secondary | ICD-10-CM | POA: Insufficient documentation

## 2019-03-09 DIAGNOSIS — I2510 Atherosclerotic heart disease of native coronary artery without angina pectoris: Secondary | ICD-10-CM | POA: Diagnosis not present

## 2019-03-09 DIAGNOSIS — I5022 Chronic systolic (congestive) heart failure: Secondary | ICD-10-CM

## 2019-03-09 DIAGNOSIS — I4510 Unspecified right bundle-branch block: Secondary | ICD-10-CM | POA: Insufficient documentation

## 2019-03-09 DIAGNOSIS — I080 Rheumatic disorders of both mitral and aortic valves: Secondary | ICD-10-CM | POA: Insufficient documentation

## 2019-03-09 DIAGNOSIS — I6523 Occlusion and stenosis of bilateral carotid arteries: Secondary | ICD-10-CM | POA: Insufficient documentation

## 2019-03-09 DIAGNOSIS — I110 Hypertensive heart disease with heart failure: Secondary | ICD-10-CM | POA: Insufficient documentation

## 2019-03-09 DIAGNOSIS — G4733 Obstructive sleep apnea (adult) (pediatric): Secondary | ICD-10-CM | POA: Insufficient documentation

## 2019-03-09 DIAGNOSIS — I255 Ischemic cardiomyopathy: Secondary | ICD-10-CM | POA: Insufficient documentation

## 2019-03-09 LAB — BASIC METABOLIC PANEL
Anion gap: 11 (ref 5–15)
BUN: 20 mg/dL (ref 8–23)
CO2: 28 mmol/L (ref 22–32)
Calcium: 9 mg/dL (ref 8.9–10.3)
Chloride: 98 mmol/L (ref 98–111)
Creatinine, Ser: 1.07 mg/dL (ref 0.61–1.24)
GFR calc Af Amer: >60 mL/min (ref >60)
GFR calc non Af Amer: >60 mL/min (ref >60)
Glucose, Bld: 82 mg/dL (ref 70–99)
Potassium: 4 mmol/L (ref 3.5–5.1)
Sodium: 137 mmol/L (ref 135–145)

## 2019-03-09 LAB — DIGOXIN LEVEL: Digoxin Level: 0.5 ng/mL — ABNORMAL LOW (ref 0.8–2.0)

## 2019-03-09 MED FILL — EPLERENONE 25 MG TABS: 25 | 30 days supply | Qty: 30 | Fill #11

## 2019-03-09 NOTE — Progress Notes (Signed)
Patient ID: Caleb Moment, PhD, male   DOB: 11-07-1940, 78 y.o.   MRN: [id number]    Advanced Heart Failure Clinic Note   PCP: [name] EP: [name] Cardiology: [name]  78 y.o.with complex past history presents for heart failure followup.  Patient had anterior MI in 2004 and developed ischemic cardiomyopathy as well as mitral regurgitation. He also developed atrial fibrillation.  In 10/14, he had MV repair, Maze, CABG with LIMA-LAD, and LA appendage closure at [facility] in [place].  Subsequently, atrial fibrillation returned and he had an atrial fibrillation ablation in [facility] in 3/15.  He wore a Zio patch in 12/15 and had a low atrial fibrillation burden of 7%.  He has had a long-standing ischemic cardiomyopathy.  In 2015, EF was 20-25%.  Echo in 2016 also showed EF 25-30% but estimated PA pressure suggested severe pulmonary hypertension.     At initial appointment, he reported increased exertional dyspnea over a number of weeks.  RHC was done, showing mildly elevated PCWP with moderate pulmonary HTN and low cardiac index (1.93 thermo, 2.13 Fick).  V/Q scan showed no PE.  I started him on digoxin and have titrated his Lasix to 80 mg daily.  He is now on bisoprolol and able to tolerate it.  At last appointment, I had him try replacing valsartan with Entresto 24/26 bid.  He was unable to tolerate it (made him dizzy, though BP was not low).  Therefore, I had him restart valsartan.  CPX in 4/16 showed mildly decreased functional capacity.  He has tolerated Adcirca 20 mg daily.  He did not tolerate an attempt to uptitrate bisoprolol.  He tried CPAP but was unable to tolerate it.  He was unable to tolerate eplerenone 50 mg daily.  Last echo in 9/17 showed EF 20-25%, stable MV repair, normal RV, and PA systolic pressure 86 mmHg.   He had a Lexiscan Cardiolite in 9/17 that showed infarction, no ischemia.  [place] in 10/17 showed severe mixed pulmonary venous HTN/pulmonary arterial HTN with PVR 4.3  WU and PCWP mildly elevated.  Adcirca was increased to 40 mg daily.   At a prior appointment, he was having more palpitations.  Event monitor in 11/17 showed atrial fibrillation episodes, these were symptomatic.  Since then, the atrial fibrillation seems to have decreased.  He saw [name] to discuss Tikosyn initiation.  No decision was reached at that time, and since palpitations have decreased again, he wants to hold off on Tikosyn for now.   He has OSA.  He cannot tolerate CPAP but is using his oral appliance.    HR was lower in 5/18.  He came into the office with HR around 40.  ECG showed an ectopic atrial rhythm with very small P waves. I stopped bisoprolol and later digoxin.  HR has gone up since.  He wore an event monitor in 6/18 showing rare 3-5 second pauses at night only, rare atrial fibrillation, and rare junctional bradycardia.  He wore an event monitor again in 4/19, this showed rare afib (1% total) with nocturnal bradycardia and 1 nocturnal 3 second pause.  No concerning findings.   He stopped Adcirca as his insurance was no longer covering it.    He saw [name], and it was decided that he would be unlikely to improve much with CRT with his current IVCD (not true LBBB).   Echo (7/19) is comparable to the past with mildly dilated LV, EF 25% with wall motion abnormalities,  mild RV dilation/mild decreased function, mild to moderate AI, stable repaired mitral valve, PASP 76 mmHg.     Given worsening symptoms, he had RHC in 8/19.  This showed evidence for volume overload with severe pulmonary arterial hypertension, PVR 5.8 WU and preserved cardiac output. Torsemide was increased.    He was started on Opsumit.  He says that it helped his breathing but he was unable to continue it due to considerable worsening of his peripheral edema.  He has stopped Opsumit.    After developing increased atrial fibrillation burden, I put him back on bisoprolol 2.5 mg daily.  He developed bradycardia and  presyncope, so this was stopped.  Lightheadedness resolved.  Zio patch in 6/20 off bisoprolol showed average HR 68, short runs of SVT (possible atrial fibrillation) and only nocturnal bradycardia.    Echo was done in 7/20 and reviewed, EF 15%, moderate-severe LV dilation, mild RV dilation with decreased systolic function, mild-moderate MR s/p MV repair, mild-moderate AI, PASP 40 mmHg.   [name] was volume overloaded recently, and I increased torsemide and started him on Entresto. He has lost another 8 lbs.  He is feeling much better.  No significant exertional dyspnea at this point.  He is doing pulmonary rehab.  No orthopnea/PND.  No chest pain.  Feels like he back to his baseline.   6 minute walk (3/16): 381 m  6 minute walk (5/16): 414.5 m 6 minute walk (10/16): 562 m 6 minute walk (1/17): 469 m 6 minute walk (8/17): 488 m 6 minute walk (6/18): 549 m 6 minute walk (11/18): 366 m 6 minute walk (9/19): 518 m 6 minute walk (9/20): 487 m 6 minute walk (12/20): 426 m  Labs (2/15): LDL 144 Labs (8/15): K 4.6, creatinine 0.9 Labs (12/15): HCT 42.3   Labs (2/16): K 4 => 4.2, creatinine 1.05 => 0.92, BNP 268 Labs (3/16): BNP 495 => 320, digoxin 0.4, RF 14.7 (very mild increase), TSH normal, HIV negative, anti-SCL70 negative, creatinine 0.91, K 4.4 Labs (7/16): K 5, creatinine 1.05, BNP 455, vitamin D normal, digoxin 0.4, B12 normal Labs (8/16): digoxin 0.8, BNP 305, K 4.2, creatinine 0.99 Labs (9/16): HCT 43.3, TSH normal, BNP 492 => 278, K 4.3, creatinine 0.97 => 1.04, digoxin 0.6, TSH normal, LDL 154, LDL-P 1654.  Labs (10/16): K 4.7, creatinine 1.1 Labs (1/17): pro-BNP 2065, digoxin 0.3, K 4.3, creatinine 1.10 => 1.28 Labs (3/17): K 4.1, creatinine 1.09, HCT 38.3 Labs (4/17): K 4.4, creatinine 0.99, digoxin 0.8 Labs (5/17): K 4.3, creatinine 1.08, BNP 317, hgb 13.1 Labs (8/17): K 4.6, creatinine 0.99, BNP 392, digoxin 0.5 Labs (9/17): K 4.4, creatinine 1.05 Labs (10/17): digoxin 0.5,  K 4.1, creatinine 1.12, HCT 44.9, digoxin 0.5 Labs (11/17): K 4 => 3.8, creatinine 1.3 => 1.16, digoxin 0.5, BNP 296 Labs (12/17): K 4.1, creatinine 1.15, BNP 294, digoxin 0.7 Labs (2/18): LDL 135, Lp(a) 124, LDL-P 979  Labs (5/18): K 4.3, creatinine 1.07 => 0.95, BNP 224, hgb 14.4, digoxin 0.6 Labs (6/18): K 4.4, creatinine 1.08, hgb 14 Labs (8/18): K 4.1, creatinine 1.05 Labs (12/18): K 3.7, creatinine 0.94 Labs (2/19): K 4.1, creatinine 0.99, BNP 255 Labs (5/19): K 4, creatinine 0.98, LDL 140 Labs (7/19): LDL 136 Labs (9/19): K 4.1, creatinine 0.91 Labs (12/19): K 3.6, creatinine 1.18, BNP 479, hgb 12.9 Labs (3/20): K 3.4, creatinine 1.14 Labs (5/20): K 4.1, creatinine 0.96 Labs (7/20): K 4, creatinine 1.05, BNP 407  Labs (10/20): BNP 489, digoxin < 0.2 Labs (  11/20): K 3.5, creatinine 1.0 Labs (12/20): K 3.1, creatinine 1.26, digoxin < 0.2  PMH: 1. CAD: Anterior MI in 2004.  Cardiac surgery in 10/14 included LIMA-LAD.  - Lexiscan Cardiolite (9/17): EF 35%, infarct present with no ischemia.  2. Chronic mitral regurgitation: 10/14 surgery at [facility] with MV repair, Maze, LIMA-LAD, and LA appendage closure.  3. Atrial fibrillation: Paroxysmal.  He was initially on Tikosyn but had breakthrough atrial fibrillation.  H/o Maze in 2014.  Had recurrent atrial fibrillation with ablation in 3/15 by Dr Ola Spurr in [facility].  Not anticoagulated after 2 severe prostate bleeding episodes.  LA appendage was oversewn with MV surgery.  - Zio patch in 12/15 with low atrial fibrillation burden (7%).  - Holter (9/16) with PACs, PVCs, short atrial fibrillation runs (nothing sustained). - Event monitor (11/17) with runs of atrial fibrillation and flutter.   - Event monitor (6/18) with rare atrial fibrillation - Event monitor 4/19 showing rare afib (1% total) with nocturnal bradycardia and 1 nocturnal 3 second pause.  No concerning findings.  - Zio patch (6/20): Primarily NSR, average HR 68, short  SVT runs (possible atrial fibrillation), occasional nocturnal bradycardia (nothing worrisome).  4. Chronotropic incompetence.  5. HTN 6. Hyperlipidemia: Refuses statin.  7. Peripheral neuropathy 8. GERD 9. H/o BPPV 10. H/o TIA 11. Ischemic cardiomyopathy: cardiac MRI 7/14 with EF 35%, moderate MR, normal RV size and systolic function, extensive anterior and anteroseptal LGE suggestive of non-viable myocardium (this was prior to LIMA-LAD).  Echo 1/15 with EF 20-25%, moderate AI, PA systolic pressure 39 mmHg. Echo (1/16) with EF 20-25%, diffuse hypokinesis with regionality, moderate LV dilation, moderate AI, s/p MV repair with mild MR and normal gradients, RV dilated with mildly decreased systolic function, PA systolic pressure 71 mmHg.   - RHC (2/16) with mean RA 9, PA 61/25 mean 40, mean PCWP 23, CI 2.13/PVR 4.3 (Fick), CI 1.93/PVR 4.7 (thermo).   - CPX (4/16) with peak VO2 17.9, VE/VCO2 34.7 => mildly decreased functional capacity.  - Spironolactone apparently caused cognitive deficits - Intolerant of Coreg due to development of severe alopecia - Unable to uptitrate bisoprolol due to intolerance.  - Lightheaded with Entresto.  - Unable to tolerate increase in valsartan to 80 mg bid.  - Echo (1/17) with EF 30-35%, moderate LV dilation, mild AI, s/p MV repair with mild MR, moderately dilated RV with mildly decreased systolic function, PA systolic pressure 69 mmHg.  - CPX (4/17): peak VO2 18, VE/VCO2 slope 33, RER 1.28 => mild to moderate functional impairment, mildly improved.  - Echo (9/17): EF 20-25% with regional WMAs, normal RV size and systolic function, PASP 86 mmHg, stable repaired mitral valve with mild MR, moderate AI.  - RHC (10/17): mean RA 9, PA 70/23 mean 43, PCWP mean 20, CI 2.96 Fick/2.84 Thermo, PVR 4.3 WU.  - Echo (9/18): severe LV dilation, EF 20-25% with WMAs, s/p MV repair with mild MR, moderate AI, moderate TR, PASP 68 mmHg, normal RV size and systolic function.  - Echo  (7/19): mildly dilated LV, EF 25% with wall motion abnormalities, mild RV dilation/mild decreased function, mild to moderate AI, stable repaired mitral valve (no MS, mild MR), PASP 76 mmHg.   - CPX (8/19): peak VO2 18.9, VE/VCO2 slope 40, RER 1.09 => moderate HF limitation.  - RHC (8/19): mean RA 14, PA 75/28 mean 47, mean PCWP 24, CI 2.44/PVR 5 Fick, CI 2.13/PVR 5.8 thermo.  - Echo (7/20): EF 15%, moderate-severe LV dilation, mild RV dilation  with decreased systolic function, mild-moderate MR s/p MV repair, mild-moderate AI, PASP 40 mmHg.  12. Carotid stenosis: Carotid dopplers (1/16) with 40-59% bilateral ICA stenosis. Carotid dopplers (4/17) with 40-59% BICA stenosis.  - Carotid dopplers (2/18) with < 50% BICA stenosis.  - Carotid dopplers (10/20): Mild stenosis.  13. Aortic insufficiency: Mild-moderate by last echo.  14. Pulmonary HTN: Mixed PAH and pulmonary venous hypertension.  PFTs (9/14) were normal.  V/Q scan (2/16) with no evidence of acute or chronic PE.   - Unable to tolerate Opsumit due to extensive peripheral edema.  15. OSA: Moderate on 5/16 sleep study. Unable to tolerate CPAP.  Repeat sleep study at [facility] in 2/18 was also suggestive of OSA.  16. Venous insufficiency 17. Ventral hernia 18. Bradycardia: Holter (5/18) with overnight pauses up to 4.7 sec (likely due to OSA), occasional short atrial tachycardia runs, no atrial fibrillation, avg HR 70s, 3% PVCs.  - Event monitor (6/18): Rare 3-4 second pauses at night, rare atrial fibrillation, rare runs of junctional bradycardia.   SH: Married, lives in [place], Engineer, water, nonsmoker  FH: CAD  ROS: All systems reviewed and negative except as per HPI.   Current Outpatient Medications  Medication Sig Dispense Refill  . ascorbic acid (VITAMIN C) 1000 MG tablet Take by mouth.    Marland Kitchen aspirin EC 81 MG tablet Take 1 tablet (81 mg total) by mouth daily. 30 tablet 3  . digoxin (LANOXIN) 0.125 MG tablet Take 1 tablet (0.125 mg  total) by mouth daily. 90 tablet 3  . eplerenone (INSPRA) 25 MG tablet TAKE 1 TABLET BY MOUTH ONCE DAILY 30 tablet 11  . magnesium gluconate (MAGONATE) 500 MG tablet Take 500 mg by mouth 2 (two) times daily.     . metolazone (ZAROXOLYN) 2.5 MG tablet Take 1 tablet by mouth every Wednesday.    . Omega-3 Fatty Acids (FISH OIL PO) Take 360 mg by mouth daily.     . potassium chloride (K-DUR,KLOR-CON) 20 MEQ tablet Take 2.5 tablets (50 mEq total) by mouth daily. 225 tablet 3  . sacubitril-valsartan (ENTRESTO) 24-26 MG Take 1 tablet by mouth 2 (two) times daily. 60 tablet 5  . tadalafil (CIALIS) 20 MG tablet Take 10 mg by mouth daily.    Marland Kitchen torsemide (DEMADEX) 20 MG tablet Take 4 tablets (80 mg total) by mouth every morning AND 2 tablets (40 mg total) every evening. 540 tablet 2   No current facility-administered medications for this encounter.   BP (!) 120/44   Pulse 69   Wt 64.3 kg (141 lb 12.8 oz)   SpO2 100%   BMI (P) 20.35 kg/m    Wt Readings from Last 3 Encounters:  03/09/19 64.3 kg (141 lb 12.8 oz)  03/02/19 67.9 kg (149 lb 9.6 oz)  02/21/19 68.7 kg (151 lb 7.3 oz)    General: NAD Neck: No JVD, no thyromegaly or thyroid nodule.  Lungs: Clear to auscultation bilaterally with normal respiratory effort. CV: Nondisplaced PMI.  Heart regular S1/S2, no S3/S4, no murmur.  1+ edema 1/2 up lower legs L>R.  No carotid bruit.  Normal pedal pulses.  Abdomen: Soft, nontender, no hepatosplenomegaly, no distention.  Skin: Intact without lesions or rashes.  Neurologic: Alert and oriented x 3.  Psych: Normal affect. Extremities: No clubbing or cyanosis.  HEENT: Normal.   Assessment/Plan: 1. CAD: S/p LIMA-LAD.  He has decided not to take statins after reviewing the data (I did recommend taking a statin but we have agreed to disagree on this,  he is taking red yeast rice extract).  Lexiscan Cardiolite was done in 9/17 due to atypical chest pain.  This showed prior infarction with no ischemia.  No  recurrence of chest pain since that time despite ongoing exercise.   - Continue ASA 81 daily.  2. S/p mitral valve repair: The MV repair looked stable on 7/20 echo with no MS, mild-moderate MR.  3. Aortic insufficiency: Echo in 9/20 with mild-moderate AI, stable.  4. Chronic systolic CHF: Ischemic cardiomyopathy, EF 15% with mildly decreased RV systolic function on 4/08 echo.  Most recent CPX in 8/19 with moderate HF limitation, mildly worse than prior.  He was volume overloaded on 8/19 RHC with severe pulmonary hypertension but preserved cardiac output.  Recent worsening of volume overload and weight gain, now seems to be back to baseline with NYHA class II symptoms. - Keep torsemide at 80 qam/40 qpm.  BMET today.    - Off bisoprolol with bradycardia.  - Continue digoxin 0.125 daily and check level today.   - Continue Entresto 24/26 bid.  I will have him see HF pharmacist in 3 wks, at that time would like to increase Entresto to 49/51 bid and probably decrease torsemide to 80 mg daily.    - Continue eplerenone 25 (unable to tolerate increase).    - Dapagliflozin would be a good next step for him, will ask pharmacist to discuss with him at followup.      - He would be unlikely to benefit much from CRT, have reviewed with [name] (had IVCD not LBBB in the past, now today has RBBB).   - Continue pulmonary rehab.  - Still needs CPX, will arrange.  - LVAD would not be out of the question, but age and prior sternotomy make it higher risk.  He says that he would not be interested in another heart surgery so will hold off on RHC for now.  5. Atrial fibrillation and atypical atrial flutter: s/p Maze in 10/14, then ablation in 3/15.  Prior to Maze, he was on Tikosyn but had breakthrough.  Currently, he is not anticoagulated due to history of prostate bleeding and his choice.  He did have his LA appendage oversewn at time of MV surgery.   1% atrial fibrillation on 4/19 event monitor.  Zio patch in 6/20 with  short SVT runs (possible atrial fibrillation).  ECG at last appt showed NSR with a long PR interval.   - He is off bisoprolol with bradycardia.    - We have had discussions about what to do with his atrial fibrillation/flutter.  He is symptomatic at times when in atrial fibrillation/fluttter. He had breakthrough on Tikosyn in the past, but has had Maze and atrial fibrillation ablation since that time. He opted to hold off on trying Tikosyn again and currently feels like the atrial fibrillation/flutter burden is manageable.   6. Pulmonary hypertension: Severe by echo in 9/17 and on RHC in 10/17.  RV actually looked ok on 9/17 echo.  Mixed pulmonary venous and pulmonary arterial HTN on RHC.  It is possible that the [facility] component is due to pulmonary vascular remodeling in the setting of chronic mitral regurgitation prior to MV repair. Negative V/Q scan, no evidence for CTEPH.  PFTs normal in 9/14.  He is seeing Dr Lake Bells.  Sleep study showed moderate OSA but he has been unable to tolerate CPAP and is now using an oral device. PASP 76 mmHg by echo in 7/19.  He tried Engineer, site but stopped  it so that he could take a nitric oxide supplement.  I repeated RHC in 8/19.  This showed severe mixed pulmonary venous/pulmonary arterial HTN with PVR 5.8 WU and preserved cardiac output. He was started on Opsumit but did not tolerate due to extensive peripheral edema.  - Breathing improved with Opsumit, but he was unable to tolerate due to severe peripheral edema.     - He is taking tadalafil at 10 mg daily rather than 20 mg daily or 40 mg daily (based on an alternative herbal regimen he is following). I told him that I would rather have him on tadalafil 20 mg daily but as he does not want to do this.  - We have discussed a trial of selexipag.  He will consider it in the future.   7. OSA: Moderate.  Has not tolerated CPAP. Probably plays a role in recurrent atrial fibrillation and pulmonary hypertension as well as nocturnal  sinus pauses.  He has been using his oral device. 8. Carotid stenosis: Mild on 10/20 dopplers.   9. Bradycardia: Nocturnal bradycardia in the past likely related to OSA.  Developed more ongoing bradycardia and bisoprolol and digoxin stopped.  Restarted later on bisoprolol with afib/mild RVR but became bradycardic with pre-syncope and bisoprolol stopped. I think that he has sick sinus syndrome.   I suspect he will eventually need a PPM, but currently, his HR is adequate off bisoprolol, and he has tolerated restarting digoxin.   - He will stay off beta blockers.  - Follow digoxin level.   Followup in 3 wks with HF pharmacist, see me in 6 wks.    Loralie Champagne 03/09/2019

## 2019-03-09 NOTE — Progress Notes (Signed)
Daily Session Note  Patient Details  Name: Caleb VERRETTE, PhD MRN: [id number] Date of Birth: December 26, 1940 Referring Provider:     Pulmonary Rehab Walk Test from 12/27/2018 in [place]  Referring Provider  [name]      Encounter Date: 03/09/2019  Check In: Session Check In - 03/09/19 1433      Check-In   Supervising physician immediately available to respond to emergencies  [facility] immediately available    Physician(s)  Dr. Sloan Leiter    Location  [facility]    Staff Present  Hoy Register, MS, Exercise Physiologist;Joan Leonia Reeves, RN, Roque Cash, RN    Virtual Visit  No    Medication changes reported      No    Fall or balance concerns reported     No    Tobacco Cessation  No Change    Warm-up and Cool-down  Performed on first and last piece of equipment    Resistance Training Performed  Yes    VAD Patient?  No    PAD/SET Patient?  No      Pain Assessment   Currently in Pain?  No/denies    Multiple Pain Sites  No       Capillary Blood Glucose: Results for orders placed or performed during the hospital encounter of 03/09/19 (from the past 24 hour(s))  Basic Metabolic Panel (BMET)     Status: None   Collection Time: 03/09/19  1:00 PM  Result Value Ref Range   Sodium 137 135 - 145 mmol/L   Potassium 4.0 3.5 - 5.1 mmol/L   Chloride 98 98 - 111 mmol/L   CO2 28 22 - 32 mmol/L   Glucose, Bld 82 70 - 99 mg/dL   BUN 20 8 - 23 mg/dL   Creatinine, Ser 1.07 0.61 - 1.24 mg/dL   Calcium 9.0 8.9 - 10.3 mg/dL   GFR calc non Af Amer >60 >60 mL/min   GFR calc Af Amer >60 >60 mL/min   Anion gap 11 5 - 15  Digoxin level     Status: Abnormal   Collection Time: 03/09/19  1:00 PM  Result Value Ref Range   Digoxin Level 0.5 (L) 0.8 - 2.0 ng/mL      Social History   Tobacco Use  Smoking Status Never Smoker  Smokeless Tobacco Never Used    Goals Met:  Exercise tolerated well No report of cardiac concerns or  symptoms Strength training completed today  Goals Unmet:  Not Applicable  Comments: Service time is from 1245 to 59    Dr. Rush Farmer is Medical Director for Pulmonary Rehab at [facility].

## 2019-03-09 NOTE — Patient Instructions (Signed)
No medication changes  Labs today We will only contact you if something comes back abnormal or we need to make some changes. Otherwise no news is good news!  Your physician recommends that you schedule a follow-up appointment in: 3 weeks with pharmacy and 6 weeks with Dr Aundra Dubin  Please call office at [phone] option 2 if you have any questions or concerns.   At the [facility], you and your health needs are our priority. As part of our continuing mission to provide you with exceptional heart care, we have created designated Provider Care Teams. These Care Teams include your primary Cardiologist (physician) and Advanced Practice Providers (APPs- Physician Assistants and Nurse Practitioners) who all work together to provide you with the care you need, when you need it.   You may see any of the following providers on your designated Care Team at your next follow up: Marland Kitchen Dr Glori Bickers . Dr Loralie Champagne . Darrick Grinder, NP . Lyda Jester, PA . Audry Riles, PharmD   Please be sure to bring in all your medications bottles to every appointment.

## 2019-03-14 ENCOUNTER — Encounter (HOSPITAL_COMMUNITY): Payer: Medicare Other

## 2019-03-14 ENCOUNTER — Ambulatory Visit (HOSPITAL_COMMUNITY): Payer: Medicare Other

## 2019-03-15 ENCOUNTER — Telehealth (HOSPITAL_COMMUNITY): Payer: Self-pay

## 2019-03-16 ENCOUNTER — Encounter (HOSPITAL_COMMUNITY): Payer: Medicare Other

## 2019-03-16 ENCOUNTER — Ambulatory Visit (HOSPITAL_COMMUNITY): Payer: Medicare Other

## 2019-03-21 ENCOUNTER — Other Ambulatory Visit: Payer: Self-pay

## 2019-03-21 ENCOUNTER — Ambulatory Visit (HOSPITAL_COMMUNITY): Payer: Medicare Other

## 2019-03-21 ENCOUNTER — Encounter (HOSPITAL_COMMUNITY)
Admission: RE | Admit: 2019-03-21 | Discharge: 2019-03-21 | Disposition: A | Discharge status: Home / Self Care | Payer: Medicare Other | Source: Ambulatory Visit | Attending: Cardiology | Admitting: Cardiology

## 2019-03-21 VITALS — Wt 140.7 lb

## 2019-03-21 DIAGNOSIS — I5022 Chronic systolic (congestive) heart failure: Secondary | ICD-10-CM

## 2019-03-21 NOTE — Progress Notes (Signed)
Daily Session Note  Patient Details  Name: Caleb WOLFINGER, PhD MRN: [id number] Date of Birth: 1940/06/24 Referring Provider:     Pulmonary Rehab Walk Test from 12/27/2018 in [place]  Referring Provider  [name]      Encounter Date: 03/21/2019  Check In: Session Check In - 03/21/19 1321      Check-In   Supervising physician immediately available to respond to emergencies  [facility] immediately available    Physician(s)  Dr. Nevada Crane    Location  [facility]    Staff Present  Rosebud Poles, RN, Bjorn Loser, MS, Exercise Physiologist;Manuel Dall Ysidro Evert, RN    Virtual Visit  No    Medication changes reported      No    Fall or balance concerns reported     No    Tobacco Cessation  No Change    Warm-up and Cool-down  Performed on first and last piece of equipment    Resistance Training Performed  Yes    VAD Patient?  No    PAD/SET Patient?  No      Pain Assessment   Currently in Pain?  No/denies    Multiple Pain Sites  No       Capillary Blood Glucose: No results found for this or any previous visit (from the past 24 hour(s)).  Exercise Prescription Changes - 03/21/19 1500      Response to Exercise   Blood Pressure (Admit)  110/54    Blood Pressure (Exercise)  120/60    Blood Pressure (Exit)  106/58    Heart Rate (Admit)  78 bpm    Heart Rate (Exercise)  97 bpm    Heart Rate (Exit)  79 bpm    Oxygen Saturation (Admit)  99 %    Oxygen Saturation (Exercise)  96 %    Oxygen Saturation (Exit)  99 %    Rating of Perceived Exertion (Exercise)  12    Perceived Dyspnea (Exercise)  1    Duration  Continue with 30 min of aerobic exercise without signs/symptoms of physical distress.    Intensity  THRR unchanged      Resistance Training   Training Prescription  Yes    Weight  orange bands    Reps  10-15    Time  10 Minutes      Treadmill   MPH  2.4    Grade  1    Minutes  15      NuStep   Level  4    SPM  80     Minutes  15    METs  2.8       Social History   Tobacco Use  Smoking Status Never Smoker  Smokeless Tobacco Never Used    Goals Met:  Exercise tolerated well No report of cardiac concerns or symptoms Strength training completed today  Goals Unmet:  Not Applicable  Comments: Service time is from 1255 to 80    Dr. Rush Farmer is Medical Director for Pulmonary Rehab at [facility].

## 2019-03-22 ENCOUNTER — Telehealth (HOSPITAL_COMMUNITY): Payer: Self-pay | Admitting: *Deleted

## 2019-03-22 NOTE — Telephone Encounter (Signed)
Pt left VM stating he needed a call back about losing weight rapidly. I called pt no answer/left VM.

## 2019-03-23 ENCOUNTER — Ambulatory Visit (HOSPITAL_COMMUNITY): Payer: Medicare Other

## 2019-03-23 ENCOUNTER — Other Ambulatory Visit: Payer: Self-pay

## 2019-03-23 ENCOUNTER — Encounter (HOSPITAL_COMMUNITY)
Admission: RE | Admit: 2019-03-23 | Discharge: 2019-03-23 | Disposition: A | Discharge status: Home / Self Care | Payer: Medicare Other | Source: Ambulatory Visit | Attending: Cardiology | Admitting: Cardiology

## 2019-03-23 DIAGNOSIS — I5022 Chronic systolic (congestive) heart failure: Secondary | ICD-10-CM

## 2019-03-27 ENCOUNTER — Other Ambulatory Visit (HOSPITAL_COMMUNITY)
Admission: RE | Admit: 2019-03-27 | Discharge: 2019-03-27 | Disposition: A | Discharge status: Home / Self Care | Payer: Medicare Other | Source: Ambulatory Visit | Attending: Cardiology | Admitting: Cardiology

## 2019-03-27 DIAGNOSIS — Z01812 Encounter for preprocedural laboratory examination: Secondary | ICD-10-CM | POA: Diagnosis not present

## 2019-03-27 DIAGNOSIS — Z20822 Contact with and (suspected) exposure to covid-19: Secondary | ICD-10-CM | POA: Insufficient documentation

## 2019-03-27 NOTE — Progress Notes (Signed)
PCP: [name] EP: [name] Cardiology: [name]  HPI:  79 y.o.with complex past history presents for heart failure followup.  Patient had anterior MI in 2004 and developed ischemic cardiomyopathy as well as mitral regurgitation. He also developed atrial fibrillation.  In 10/14, he had MV repair, Maze, CABG with LIMA-LAD, and LA appendage closure at [facility] in [place].  Subsequently, atrial fibrillation returned and he had an atrial fibrillation ablation in [facility] in 3/15.  He wore a Zio patch in 12/15 and had a low atrial fibrillation burden of 7%.  He has had a long-standing ischemic cardiomyopathy.  In 2015, EF was 20-25%.  Echo in 2016 also showed EF 25-30% but estimated PA pressure suggested severe pulmonary hypertension.     At initial appointment, he reported increased exertional dyspnea over a number of weeks.  RHC was done, showing mildly elevated PCWP with moderate pulmonary HTN and low cardiac index (1.93 thermo, 2.13 Fick).  V/Q scan showed no PE.  [name] started him on digoxin and titrated his Lasix to 80 mg daily.  He is now on bisoprolol and able to tolerate it.  At last appointment, I had him try replacing valsartan with Entresto 24/26 mg bid.  He was unable to tolerate it (made him dizzy, though BP was not low).  Therefore, I had him restart valsartan.  CPX in 4/16 showed mildly decreased functional capacity.  He has tolerated Adcirca 20 mg daily.  He did not tolerate an attempt to uptitrate bisoprolol.  He tried CPAP but was unable to tolerate it.  He was unable to tolerate eplerenone 50 mg daily.  Last echo in 9/17 showed EF 20-25%, stable MV repair, normal RV, and PA systolic pressure 86 mmHg.   He had a Lexiscan Cardiolite in 9/17 that showed infarction, no ischemia.  Palmer in 10/17 showed severe mixed pulmonary venous HTN/pulmonary arterial HTN with PVR 4.3 WU and PCWP mildly elevated.  Adcirca was increased to 40 mg daily.   At a prior appointment, he was  having more palpitations.  Event monitor in 11/17 showed atrial fibrillation episodes, these were symptomatic.  Since then, the atrial fibrillation seems to have decreased.  He saw [name] to discuss Tikosyn initiation.  No decision was reached at that time, and since palpitations have decreased again, he wants to hold off on Tikosyn for now.   He has OSA.  He cannot tolerate CPAP but is using his oral appliance.    HR was lower in 5/18.  He came into the office with HR around 40.  ECG showed an ectopic atrial rhythm with very small P waves. [name] stopped bisoprolol and later digoxin.  HR has gone up since.  He wore an event monitor in 6/18 showing rare 3-5 second pauses at night only, rare atrial fibrillation, and rare junctional bradycardia.  He wore an event monitor again in 4/19, this showed rare afib (1% total) with nocturnal bradycardia and 1 nocturnal 3 second pause.  No concerning findings.   He stopped Adcirca as his insurance was no longer covering it.    He saw [name], and it was decided that he would be unlikely to improve much with CRT with his current IVCD (not true LBBB).   Echo (7/19) is comparable to the past with mildly dilated LV, EF 25% with wall motion abnormalities, mild RV dilation/mild decreased function, mild to moderate AI, stable repaired mitral valve, PASP 76 mmHg.     Given worsening symptoms,  he had RHC in 8/19.  This showed evidence for volume overload with severe pulmonary arterial hypertension, PVR 5.8 WU and preserved cardiac output. Torsemide was increased.    He was started on Opsumit.  He says that it helped his breathing but he was unable to continue it due to considerable worsening of his peripheral edema.  He has stopped Opsumit.    After developing increased atrial fibrillation burden, I put him back on bisoprolol 2.5 mg daily.  He developed bradycardia and presyncope, so this was stopped.  Lightheadedness resolved.  Zio patch in 6/20 off  bisoprolol showed average HR 68, short runs of SVT (possible atrial fibrillation) and only nocturnal bradycardia.    Echo was done in 7/20 and reviewed, EF 15%, moderate-severe LV dilation, mild RV dilation with decreased systolic function, mild-moderate MR s/p MV repair, mild-moderate AI, PASP 40 mmHg.   [name] was volume overloaded recently, so [name] increased torsemide and started him on Entresto. He had lost another 8 lbs.  He was feeling much better.  No significant exertional dyspnea at this point.  He is doing pulmonary rehab.  No orthopnea/PND.  No chest pain.  Felt like he back to his baseline.   Today he returns to HF clinic for pharmacist medication titration. At last visit with MD, valsartan was changed to Entresto 24/26 mg BID and torsemide was increased to 80 mg QAM/40 mg QPM. Overall he reports doing well with that changes. He "feels better than I have felt in a long time". No dizziness, lightheadedness, chest pain or palpitations. His breathing is fine, gets SOB with moderate exertion, which is stable. He has been taking torsemide 60 mg AM/20 mg PM. His weight has been stable at 140 lbs (139.2 lbs in clinic today). No LEE, PND or orthopnea. ReDS reading in clinic is 37%. His appetite is good, much better than when he had excess fluid at beginning of December 2019. He follows a low-sodium diet. Taking all medications as prescribed, except tadalafil, which he discontinued due to lack of benefit.     Marland Kitchen Shortness of breath/dyspnea on exertion? yes - with moderate exertion (stable) . Orthopnea/PND? no . Edema? no . Lightheadedness/dizziness? no . Daily weights at home? yes . Blood pressure/heart rate monitoring at home? Yes - Has been 120/60-70s at home. Was 140/60 in clinic today.  . Following low-sodium/fluid-restricted diet? yes  HF Medications: Entresto 24/26 mg BID Eplerenone 25 mg daily Digoxin 0.125 mg daily Torsemide 80 mg QAM/40 mg QPM Metolazone 2.5 mg  PRN Potassium chloride 50 mEq daily  Has the patient been experiencing any side effects to the medications prescribed?  No  Does the patient have any problems obtaining medications due to transportation or finances?    No - has Medicare Part D.  Understanding of regimen: good Understanding of indications: excellent Potential of compliance: good Patient understands to avoid NSAIDs. Patient understands to avoid decongestants.    Pertinent Lab Values (03/09/19): Marland Kitchen Serum creatinine 1.07, BUN 20, Potassium 4.0, Sodium 137, BNP 488.9, Digoxin 0.5 ng/mL (03/09/19_0 )   Vital Signs: . Weight: 139.2 lbs (last clinic weight: 141.8 lbs) . Blood pressure: 140/60   . Heart rate: 83   Assessment: 1. CAD: S/p LIMA-LAD.  He has decided not to take statins after reviewing the data ([name] did recommend taking a statin but they have agreed to disagree on this, he is taking red yeast rice extract).  Lexiscan Cardiolite was done in 9/17 due to atypical chest pain.  This showed prior infarction with no ischemia.  No recurrence of chest pain since that time despite ongoing exercise.   - Continue ASA 81 daily.  2. S/p mitral valve repair: The MV repair looked stable on 7/20 echo with no MS, mild-moderate MR.  3. Aortic insufficiency: Echo in 9/20 with mild-moderate AI, stable.  4. Chronic systolic CHF: Ischemic cardiomyopathy, EF 15% with mildly decreased RV systolic function on 6/44 echo.  Most recent CPX in 8/19 with moderate HF limitation, mildly worse than prior.  He was volume overloaded on 8/19 RHC with severe pulmonary hypertension but preserved cardiac output.   -Recent worsening of volume overload and weight gain, now seems to be back to baseline with NYHA class II symptoms. Euvolemic on exam today, ReDS 37%. - Continue torsemide 60 qam/20 qpm.   - Off bisoprolol with bradycardia.  - Continue Entresto 24/26 mg BID. Discussed increasing Entresto to 49/51 mg BID today. Although Mr. Schoenberger  reported doing well on the medication, he does not wish to increase it at this time. He wants to have more time at the low-dose to make sure he tolerates it well. Will discuss increasing to the 49/51 mg dose at next HF clinic visit with [name].   - Continue eplerenone 25 mg daily (unable to tolerate 50 mg).    - Continue digoxin 0.125 daily. Digoxin level 0.5 ng/mL on 03/09/19 at 1300.    - Dapagliflozin would be a good next step for him. I discussed the results of DAPA-HF with him and provided trial to him for review. He will review data and decide if he is amenable to trying the medication at future visits.       - He would be unlikely to benefit much from CRT, have reviewed with [name] (had IVCD not LBBB in the past, now today has RBBB).   - Continue pulmonary rehab.  - Still needs CPX, will arrange.  - LVAD would not be out of the question, but age and prior sternotomy make it higher risk.  He says that he would not be interested in another heart surgery so will hold off on RHC for now.  5. Atrial fibrillation and atypical atrial flutter: s/p Maze in 10/14, then ablation in 3/15.  Prior to Maze, he was on Tikosyn but had breakthrough.  Currently, he is not anticoagulated due to history of prostate bleeding and his choice.  He did have his LA appendage oversewn at time of MV surgery.   1% atrial fibrillation on 4/19 event monitor.  Zio patch in 6/20 with short SVT runs (possible atrial fibrillation).  ECG at last appt showed NSR with a long PR interval.   - He is off bisoprolol with bradycardia.    - We have had discussions about what to do with his atrial fibrillation/flutter.  He is symptomatic at times when in atrial fibrillation/fluttter. He had breakthrough on Tikosyn in the past, but has had Maze and atrial fibrillation ablation since that time. He opted to hold off on trying Tikosyn again and currently feels like the atrial fibrillation/flutter burden is manageable.   6. Pulmonary  hypertension: Severe by echo in 9/17 and on RHC in 10/17.  RV actually looked ok on 9/17 echo.  Mixed pulmonary venous and pulmonary arterial HTN on RHC.  It is possible that the [facility] component is due to pulmonary vascular remodeling in the setting of chronic mitral regurgitation prior to MV repair. Negative V/Q scan, no evidence for CTEPH.  PFTs  normal in 9/14.  He is seeing Dr Lake Bells.  Sleep study showed moderate OSA but he has been unable to tolerate CPAP and is now using an oral device. PASP 76 mmHg by echo in 7/19.  He tried Engineer, site but stopped it so that he could take a nitric oxide supplement.  I repeated RHC in 8/19.  This showed severe mixed pulmonary venous/pulmonary arterial HTN with PVR 5.8 WU and preserved cardiac output. He was started on Opsumit but did not tolerate due to extensive peripheral edema.  - Breathing improved with Opsumit, but he was unable to tolerate due to severe peripheral edema.   - He was taking tadalafil at the 10 mg daily dose rather than 20 mg daily or 40 mg daily (based on an alternative herbal regimen he is following). However, he told me he had self-discontinued the tadalafil 2 weeks ago as he did not feel it provided any benefit.  - We have discussed a trial of selexipag.  He will consider it in the future.   7. OSA: Moderate.  Has not tolerated CPAP. Probably plays a role in recurrent atrial fibrillation and pulmonary hypertension as well as nocturnal sinus pauses.  He has been using his oral device. 8. Carotid stenosis: Mild on 10/20 dopplers.   9. Bradycardia: Nocturnal bradycardia in the past likely related to OSA.  Developed more ongoing bradycardia and bisoprolol and digoxin stopped.  Restarted later on bisoprolol with afib/mild RVR but became bradycardic/ with pre-syncope and bisoprolol stopped. I think that he has sick sinus syndrome.   I suspect he will eventually need a PPM, but currently, his HR is adequate off bisoprolol, and he has tolerated restarting  digoxin.   - He will stay off beta blockers.    Plan: 1) Medication changes: Based on clinical presentation, vital signs and recent labs will make no medication changes today. Will consider increasing Entresto to 49/51 mg BID at next visit.  3) Follow-up: 3 weeks with [name].    Audry Riles, PharmD, BCPS, BCCP, CPP Heart Failure Clinic Pharmacist [phone]

## 2019-03-28 ENCOUNTER — Encounter (HOSPITAL_COMMUNITY): Payer: Medicare Other

## 2019-03-28 ENCOUNTER — Ambulatory Visit (HOSPITAL_COMMUNITY): Payer: Medicare Other

## 2019-03-28 LAB — NOVEL CORONAVIRUS, NAA (HOSP ORDER, SEND-OUT TO REF LAB; TAT 18-24 HRS): SARS-CoV-2, NAA: NOT DETECTED

## 2019-03-30 ENCOUNTER — Ambulatory Visit (HOSPITAL_COMMUNITY): Payer: Medicare Other

## 2019-03-30 ENCOUNTER — Encounter (HOSPITAL_COMMUNITY): Payer: Medicare Other

## 2019-03-30 ENCOUNTER — Other Ambulatory Visit: Payer: Self-pay

## 2019-03-30 ENCOUNTER — Ambulatory Visit (HOSPITAL_COMMUNITY): Payer: Medicare Other | Attending: Cardiology

## 2019-03-30 ENCOUNTER — Telehealth: Payer: Self-pay

## 2019-03-30 DIAGNOSIS — I272 Pulmonary hypertension, unspecified: Secondary | ICD-10-CM

## 2019-03-30 DIAGNOSIS — R0609 Other forms of dyspnea: Secondary | ICD-10-CM | POA: Diagnosis not present

## 2019-03-30 DIAGNOSIS — I5022 Chronic systolic (congestive) heart failure: Secondary | ICD-10-CM | POA: Insufficient documentation

## 2019-03-30 NOTE — Telephone Encounter (Signed)
I am okay with transfer-please tell [name] I will miss him and it was an honor to be his physician for these last few years

## 2019-03-30 NOTE — Telephone Encounter (Signed)
Are you ok with transfer?

## 2019-03-30 NOTE — Telephone Encounter (Signed)
Dr. Sharlet Salina has given verbal ok for transfer.

## 2019-03-30 NOTE — Telephone Encounter (Signed)
Patient's wife, Charlann Noss, currently in office at [facility], and states that the patient is requesting to [facility] from Dr Yong Channel ([facility])  to Dr Sharlet Salina. States that Dr Sharlet Salina has already given verbal ok in her appointment, but needs to get the ok from Dr Yong Channel.

## 2019-04-01 MED FILL — ENTRESTO 24 MG-26 MG TABLET: 24-26 | 30 days supply | Qty: 60 | Fill #1

## 2019-04-03 ENCOUNTER — Other Ambulatory Visit: Payer: Self-pay

## 2019-04-03 ENCOUNTER — Ambulatory Visit (HOSPITAL_COMMUNITY)
Admission: RE | Admit: 2019-04-03 | Discharge: 2019-04-03 | Disposition: A | Discharge status: Home / Self Care | Payer: Medicare Other | Source: Ambulatory Visit | Attending: Internal Medicine | Admitting: Internal Medicine

## 2019-04-03 VITALS — BP 140/60 | HR 82 | Wt 139.2 lb

## 2019-04-03 DIAGNOSIS — I4892 Unspecified atrial flutter: Secondary | ICD-10-CM | POA: Insufficient documentation

## 2019-04-03 DIAGNOSIS — I4510 Unspecified right bundle-branch block: Secondary | ICD-10-CM | POA: Insufficient documentation

## 2019-04-03 DIAGNOSIS — I255 Ischemic cardiomyopathy: Secondary | ICD-10-CM | POA: Insufficient documentation

## 2019-04-03 DIAGNOSIS — I2510 Atherosclerotic heart disease of native coronary artery without angina pectoris: Secondary | ICD-10-CM | POA: Diagnosis not present

## 2019-04-03 DIAGNOSIS — G4733 Obstructive sleep apnea (adult) (pediatric): Secondary | ICD-10-CM | POA: Insufficient documentation

## 2019-04-03 DIAGNOSIS — I5022 Chronic systolic (congestive) heart failure: Secondary | ICD-10-CM | POA: Insufficient documentation

## 2019-04-03 DIAGNOSIS — Z951 Presence of aortocoronary bypass graft: Secondary | ICD-10-CM | POA: Insufficient documentation

## 2019-04-03 DIAGNOSIS — I252 Old myocardial infarction: Secondary | ICD-10-CM | POA: Diagnosis not present

## 2019-04-03 DIAGNOSIS — I4891 Unspecified atrial fibrillation: Secondary | ICD-10-CM | POA: Diagnosis not present

## 2019-04-03 DIAGNOSIS — I272 Pulmonary hypertension, unspecified: Secondary | ICD-10-CM | POA: Diagnosis not present

## 2019-04-03 MED ORDER — TORSEMIDE 20 MG PO TABS: ORAL_TABLET | ORAL | 2 refills | Status: DC

## 2019-04-03 NOTE — Patient Instructions (Signed)
It was a pleasure seeing you today!  MEDICATIONS: -No medication changes today -Call if you have questions about your medications.  NEXT APPOINTMENT: Return to clinic in 3 weeks with [name].  In general, to take care of your heart failure: -Limit your fluid intake to 2 Liters (half-gallon) per day.   -Limit your salt intake to ideally 2-3 grams (2000-3000 mg) per day. -Weigh yourself daily and record, and bring that "weight diary" to your next appointment.  (Weight gain of 2-3 pounds in 1 day typically means fluid weight.) -The medications for your heart are to help your heart and help you live longer.   -Please contact us before stopping any of your heart medications.  Call the clinic at [phone] with questions or to reschedule future appointments.

## 2019-04-04 ENCOUNTER — Encounter (HOSPITAL_COMMUNITY): Payer: Medicare Other

## 2019-04-04 ENCOUNTER — Ambulatory Visit (HOSPITAL_COMMUNITY): Payer: Medicare Other

## 2019-04-06 ENCOUNTER — Ambulatory Visit (HOSPITAL_COMMUNITY): Payer: Medicare Other

## 2019-04-06 ENCOUNTER — Encounter (HOSPITAL_COMMUNITY): Payer: Medicare Other

## 2019-04-08 ENCOUNTER — Other Ambulatory Visit (HOSPITAL_COMMUNITY): Payer: Self-pay | Admitting: Cardiology

## 2019-04-10 ENCOUNTER — Other Ambulatory Visit (HOSPITAL_COMMUNITY): Payer: Self-pay

## 2019-04-10 MED FILL — EPLERENONE 25 MG TABS: 25 | 30 days supply | Qty: 30 | Fill #0

## 2019-04-10 NOTE — Telephone Encounter (Signed)
Duplicate/error

## 2019-04-11 ENCOUNTER — Ambulatory Visit (HOSPITAL_COMMUNITY): Payer: Medicare Other

## 2019-04-11 ENCOUNTER — Encounter (HOSPITAL_COMMUNITY): Payer: Medicare Other

## 2019-04-13 ENCOUNTER — Encounter (HOSPITAL_COMMUNITY): Payer: Medicare Other

## 2019-04-13 ENCOUNTER — Ambulatory Visit (HOSPITAL_COMMUNITY): Payer: Medicare Other

## 2019-04-18 ENCOUNTER — Ambulatory Visit (HOSPITAL_COMMUNITY): Payer: Medicare Other

## 2019-04-18 ENCOUNTER — Encounter (HOSPITAL_COMMUNITY): Payer: Medicare Other

## 2019-04-20 ENCOUNTER — Encounter (HOSPITAL_COMMUNITY): Payer: Medicare Other

## 2019-04-20 ENCOUNTER — Ambulatory Visit (HOSPITAL_COMMUNITY): Payer: Medicare Other

## 2019-04-24 MED FILL — DIGOXIN 0.125 MG TABLET: 125 | 90 days supply | Qty: 90 | Fill #0

## 2019-04-25 ENCOUNTER — Ambulatory Visit (HOSPITAL_COMMUNITY)
Admission: RE | Admit: 2019-04-25 | Discharge: 2019-04-25 | Disposition: A | Discharge status: Home / Self Care | Payer: Medicare Other | Source: Ambulatory Visit | Attending: Cardiology | Admitting: Cardiology

## 2019-04-25 ENCOUNTER — Other Ambulatory Visit: Payer: Self-pay

## 2019-04-25 ENCOUNTER — Encounter (HOSPITAL_COMMUNITY): Payer: Self-pay | Admitting: Cardiology

## 2019-04-25 VITALS — BP 118/60 | HR 68 | Wt 144.2 lb

## 2019-04-25 DIAGNOSIS — I252 Old myocardial infarction: Secondary | ICD-10-CM | POA: Diagnosis not present

## 2019-04-25 DIAGNOSIS — Z79899 Other long term (current) drug therapy: Secondary | ICD-10-CM | POA: Diagnosis not present

## 2019-04-25 DIAGNOSIS — I2721 Secondary pulmonary arterial hypertension: Secondary | ICD-10-CM | POA: Insufficient documentation

## 2019-04-25 DIAGNOSIS — I080 Rheumatic disorders of both mitral and aortic valves: Secondary | ICD-10-CM | POA: Insufficient documentation

## 2019-04-25 DIAGNOSIS — E785 Hyperlipidemia, unspecified: Secondary | ICD-10-CM | POA: Insufficient documentation

## 2019-04-25 DIAGNOSIS — I5022 Chronic systolic (congestive) heart failure: Secondary | ICD-10-CM | POA: Diagnosis not present

## 2019-04-25 DIAGNOSIS — Z8249 Family history of ischemic heart disease and other diseases of the circulatory system: Secondary | ICD-10-CM | POA: Diagnosis not present

## 2019-04-25 DIAGNOSIS — Z8673 Personal history of transient ischemic attack (TIA), and cerebral infarction without residual deficits: Secondary | ICD-10-CM | POA: Diagnosis not present

## 2019-04-25 DIAGNOSIS — G4733 Obstructive sleep apnea (adult) (pediatric): Secondary | ICD-10-CM | POA: Diagnosis not present

## 2019-04-25 DIAGNOSIS — Z7982 Long term (current) use of aspirin: Secondary | ICD-10-CM | POA: Insufficient documentation

## 2019-04-25 DIAGNOSIS — I480 Paroxysmal atrial fibrillation: Secondary | ICD-10-CM | POA: Insufficient documentation

## 2019-04-25 DIAGNOSIS — I255 Ischemic cardiomyopathy: Secondary | ICD-10-CM | POA: Insufficient documentation

## 2019-04-25 DIAGNOSIS — I484 Atypical atrial flutter: Secondary | ICD-10-CM | POA: Insufficient documentation

## 2019-04-25 DIAGNOSIS — I110 Hypertensive heart disease with heart failure: Secondary | ICD-10-CM | POA: Insufficient documentation

## 2019-04-25 DIAGNOSIS — I2510 Atherosclerotic heart disease of native coronary artery without angina pectoris: Secondary | ICD-10-CM | POA: Diagnosis not present

## 2019-04-25 DIAGNOSIS — I272 Pulmonary hypertension, unspecified: Secondary | ICD-10-CM | POA: Diagnosis not present

## 2019-04-25 DIAGNOSIS — R001 Bradycardia, unspecified: Secondary | ICD-10-CM | POA: Insufficient documentation

## 2019-04-25 DIAGNOSIS — I872 Venous insufficiency (chronic) (peripheral): Secondary | ICD-10-CM | POA: Insufficient documentation

## 2019-04-25 LAB — BASIC METABOLIC PANEL
Anion gap: 11 (ref 5–15)
BUN: 18 mg/dL (ref 8–23)
CO2: 25 mmol/L (ref 22–32)
Calcium: 8.8 mg/dL — ABNORMAL LOW (ref 8.9–10.3)
Chloride: 95 mmol/L — ABNORMAL LOW (ref 98–111)
Creatinine, Ser: 0.92 mg/dL (ref 0.61–1.24)
GFR calc Af Amer: >60 mL/min (ref >60)
GFR calc non Af Amer: >60 mL/min (ref >60)
Glucose, Bld: 105 mg/dL — ABNORMAL HIGH (ref 70–99)
Potassium: 3.9 mmol/L (ref 3.5–5.1)
Sodium: 131 mmol/L — ABNORMAL LOW (ref 135–145)

## 2019-04-25 LAB — BRAIN NATRIURETIC PEPTIDE: B Natriuretic Peptide: 567.8 pg/mL — ABNORMAL HIGH (ref 0.0–100.0)

## 2019-04-25 LAB — MAGNESIUM: Magnesium: 2.4 mg/dL (ref 1.7–2.4)

## 2019-04-25 LAB — DIGOXIN LEVEL: Digoxin Level: 0.4 ng/mL — ABNORMAL LOW (ref 0.8–2.0)

## 2019-04-25 NOTE — Patient Instructions (Signed)
NO medication changes today!!  Labs today We will only contact you if something comes back abnormal or we need to make some changes. Otherwise no news is good news!  Your physician recommends that you schedule a follow-up appointment in: 2 months with Dr Aundra Dubin  Please call office at [phone] option 2 if you have any questions or concerns.   At the [facility], you and your health needs are our priority. As part of our continuing mission to provide you with exceptional heart care, we have created designated Provider Care Teams. These Care Teams include your primary Cardiologist (physician) and Advanced Practice Providers (APPs- Physician Assistants and Nurse Practitioners) who all work together to provide you with the care you need, when you need it.   You may see any of the following providers on your designated Care Team at your next follow up: Marland Kitchen Dr Glori Bickers . Dr Loralie Champagne . Darrick Grinder, NP . Lyda Jester, PA . Audry Riles, PharmD   Please be sure to bring in all your medications bottles to every appointment.

## 2019-04-26 NOTE — Progress Notes (Signed)
Patient ID: Caleb Moment, PhD, male   DOB: 11-07-1940, 79 y.o.   MRN: [id number]    Advanced Heart Failure Clinic Note   PCP: [name] EP: [name] Cardiology: [name]  79 y.o.with complex past history presents for heart failure followup.  Patient had anterior MI in 2004 and developed ischemic cardiomyopathy as well as mitral regurgitation. He also developed atrial fibrillation.  In 10/14, he had MV repair, Maze, CABG with LIMA-LAD, and LA appendage closure at [facility] in [place].  Subsequently, atrial fibrillation returned and he had an atrial fibrillation ablation in [facility] in 3/15.  He wore a Zio patch in 12/15 and had a low atrial fibrillation burden of 7%.  He has had a long-standing ischemic cardiomyopathy.  In 2015, EF was 20-25%.  Echo in 2016 also showed EF 25-30% but estimated PA pressure suggested severe pulmonary hypertension.     At initial appointment, he reported increased exertional dyspnea over a number of weeks.  RHC was done, showing mildly elevated PCWP with moderate pulmonary HTN and low cardiac index (1.93 thermo, 2.13 Fick).  V/Q scan showed no PE.  I started him on digoxin and have titrated his Lasix to 80 mg daily.  He is now on bisoprolol and able to tolerate it.  At last appointment, I had him try replacing valsartan with Entresto 24/26 bid.  He was unable to tolerate it (made him dizzy, though BP was not low).  Therefore, I had him restart valsartan.  CPX in 4/16 showed mildly decreased functional capacity.  He has tolerated Adcirca 20 mg daily.  He did not tolerate an attempt to uptitrate bisoprolol.  He tried CPAP but was unable to tolerate it.  He was unable to tolerate eplerenone 50 mg daily.  Last echo in 9/17 showed EF 20-25%, stable MV repair, normal RV, and PA systolic pressure 86 mmHg.   He had a Lexiscan Cardiolite in 9/17 that showed infarction, no ischemia.  [place] in 10/17 showed severe mixed pulmonary venous HTN/pulmonary arterial HTN with PVR 4.3  WU and PCWP mildly elevated.  Adcirca was increased to 40 mg daily.   At a prior appointment, he was having more palpitations.  Event monitor in 11/17 showed atrial fibrillation episodes, these were symptomatic.  Since then, the atrial fibrillation seems to have decreased.  He saw [name] to discuss Tikosyn initiation.  No decision was reached at that time, and since palpitations have decreased again, he wants to hold off on Tikosyn for now.   He has OSA.  He cannot tolerate CPAP but is using his oral appliance.    HR was lower in 5/18.  He came into the office with HR around 40.  ECG showed an ectopic atrial rhythm with very small P waves. I stopped bisoprolol and later digoxin.  HR has gone up since.  He wore an event monitor in 6/18 showing rare 3-5 second pauses at night only, rare atrial fibrillation, and rare junctional bradycardia.  He wore an event monitor again in 4/19, this showed rare afib (1% total) with nocturnal bradycardia and 1 nocturnal 3 second pause.  No concerning findings.   He stopped Adcirca as his insurance was no longer covering it.    He saw [name], and it was decided that he would be unlikely to improve much with CRT with his current IVCD (not true LBBB).   Echo (7/19) is comparable to the past with mildly dilated LV, EF 25% with wall motion abnormalities,  mild RV dilation/mild decreased function, mild to moderate AI, stable repaired mitral valve, PASP 76 mmHg.     Given worsening symptoms, he had RHC in 8/19.  This showed evidence for volume overload with severe pulmonary arterial hypertension, PVR 5.8 WU and preserved cardiac output. Torsemide was increased.    He was started on Opsumit.  He says that it helped his breathing but he was unable to continue it due to considerable worsening of his peripheral edema.  He has stopped Opsumit.    After developing increased atrial fibrillation burden, I put him back on bisoprolol 2.5 mg daily.  He developed bradycardia and  presyncope, so this was stopped.  Lightheadedness resolved.  Zio patch in 6/20 off bisoprolol showed average HR 68, short runs of SVT (possible atrial fibrillation) and only nocturnal bradycardia.    Echo was done in 7/20 and reviewed, EF 15%, moderate-severe LV dilation, mild RV dilation with decreased systolic function, mild-moderate MR s/p MV repair, mild-moderate AI, PASP 40 mmHg.   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  No dyspnea walking on flat ground.  Mild dyspnea with stairs.  No lightheadedness.  No chest pain.  Able to do all ADLs without problems.   ECG (personally reviewed): NSR (low voltage P waves), RBBB, LAFB  6 minute walk (3/16): 381 m  6 minute walk (5/16): 414.5 m 6 minute walk (10/16): 562 m 6 minute walk (1/17): 469 m 6 minute walk (8/17): 488 m 6 minute walk (6/18): 549 m 6 minute walk (11/18): 366 m 6 minute walk (9/19): 518 m 6 minute walk (9/20): 487 m 6 minute walk (12/20): 426 m  Labs (2/15): LDL 144 Labs (8/15): K 4.6, creatinine 0.9 Labs (12/15): HCT 42.3   Labs (2/16): K 4 => 4.2, creatinine 1.05 => 0.92, BNP 268 Labs (3/16): BNP 495 => 320, digoxin 0.4, RF 14.7 (very mild increase), TSH normal, HIV negative, anti-SCL70 negative, creatinine 0.91, K 4.4 Labs (7/16): K 5, creatinine 1.05, BNP 455, vitamin D normal, digoxin 0.4, B12 normal Labs (8/16): digoxin 0.8, BNP 305, K 4.2, creatinine 0.99 Labs (9/16): HCT 43.3, TSH normal, BNP 492 => 278, K 4.3, creatinine 0.97 => 1.04, digoxin 0.6, TSH normal, LDL 154, LDL-P 1654.  Labs (10/16): K 4.7, creatinine 1.1 Labs (1/17): pro-BNP 2065, digoxin 0.3, K 4.3, creatinine 1.10 => 1.28 Labs (3/17): K 4.1, creatinine 1.09, HCT 38.3 Labs (4/17): K 4.4, creatinine 0.99, digoxin 0.8 Labs (5/17): K 4.3, creatinine 1.08, BNP 317, hgb 13.1 Labs (8/17): K 4.6, creatinine 0.99, BNP 392, digoxin 0.5 Labs (9/17): K 4.4, creatinine 1.05 Labs (10/17): digoxin 0.5, K 4.1, creatinine 1.12, HCT 44.9,  digoxin 0.5 Labs (11/17): K 4 => 3.8, creatinine 1.3 => 1.16, digoxin 0.5, BNP 296 Labs (12/17): K 4.1, creatinine 1.15, BNP 294, digoxin 0.7 Labs (2/18): LDL 135, Lp(a) 124, LDL-P 979  Labs (5/18): K 4.3, creatinine 1.07 => 0.95, BNP 224, hgb 14.4, digoxin 0.6 Labs (6/18): K 4.4, creatinine 1.08, hgb 14 Labs (8/18): K 4.1, creatinine 1.05 Labs (12/18): K 3.7, creatinine 0.94 Labs (2/19): K 4.1, creatinine 0.99, BNP 255 Labs (5/19): K 4, creatinine 0.98, LDL 140 Labs (7/19): LDL 136 Labs (9/19): K 4.1, creatinine 0.91 Labs (12/19): K 3.6, creatinine 1.18, BNP 479, hgb 12.9 Labs (3/20): K 3.4, creatinine 1.14 Labs (5/20): K 4.1, creatinine 0.96 Labs (7/20): K 4, creatinine 1.05, BNP 407  Labs (10/20): BNP 489, digoxin < 0.2 Labs (11/20): K 3.5, creatinine 1.0 Labs (12/20): K 3.1,  creatinine 1.26 => 1.07, digoxin < 0.2 => 0.5  PMH: 1. CAD: Anterior MI in 2004.  Cardiac surgery in 10/14 included LIMA-LAD.  - Lexiscan Cardiolite (9/17): EF 35%, infarct present with no ischemia.  2. Chronic mitral regurgitation: 10/14 surgery at [facility] with MV repair, Maze, LIMA-LAD, and LA appendage closure.  3. Atrial fibrillation: Paroxysmal.  He was initially on Tikosyn but had breakthrough atrial fibrillation.  H/o Maze in 2014.  Had recurrent atrial fibrillation with ablation in 3/15 by Dr Ola Spurr in [facility].  Not anticoagulated after 2 severe prostate bleeding episodes.  LA appendage was oversewn with MV surgery.  - Zio patch in 12/15 with low atrial fibrillation burden (7%).  - Holter (9/16) with PACs, PVCs, short atrial fibrillation runs (nothing sustained). - Event monitor (11/17) with runs of atrial fibrillation and flutter.   - Event monitor (6/18) with rare atrial fibrillation - Event monitor 4/19 showing rare afib (1% total) with nocturnal bradycardia and 1 nocturnal 3 second pause.  No concerning findings.  - Zio patch (6/20): Primarily NSR, average HR 68, short SVT runs (possible  atrial fibrillation), occasional nocturnal bradycardia (nothing worrisome).  4. Chronotropic incompetence.  5. HTN 6. Hyperlipidemia: Refuses statin.  7. Peripheral neuropathy 8. GERD 9. H/o BPPV 10. H/o TIA 11. Ischemic cardiomyopathy: cardiac MRI 7/14 with EF 35%, moderate MR, normal RV size and systolic function, extensive anterior and anteroseptal LGE suggestive of non-viable myocardium (this was prior to LIMA-LAD).  Echo 1/15 with EF 20-25%, moderate AI, PA systolic pressure 39 mmHg. Echo (1/16) with EF 20-25%, diffuse hypokinesis with regionality, moderate LV dilation, moderate AI, s/p MV repair with mild MR and normal gradients, RV dilated with mildly decreased systolic function, PA systolic pressure 71 mmHg.   - RHC (2/16) with mean RA 9, PA 61/25 mean 40, mean PCWP 23, CI 2.13/PVR 4.3 (Fick), CI 1.93/PVR 4.7 (thermo).   - CPX (4/16) with peak VO2 17.9, VE/VCO2 34.7 => mildly decreased functional capacity.  - Spironolactone apparently caused cognitive deficits - Intolerant of Coreg due to development of severe alopecia - Unable to uptitrate bisoprolol due to intolerance.  - Lightheaded with Entresto.  - Unable to tolerate increase in valsartan to 80 mg bid.  - Echo (1/17) with EF 30-35%, moderate LV dilation, mild AI, s/p MV repair with mild MR, moderately dilated RV with mildly decreased systolic function, PA systolic pressure 69 mmHg.  - CPX (4/17): peak VO2 18, VE/VCO2 slope 33, RER 1.28 => mild to moderate functional impairment, mildly improved.  - Echo (9/17): EF 20-25% with regional WMAs, normal RV size and systolic function, PASP 86 mmHg, stable repaired mitral valve with mild MR, moderate AI.  - RHC (10/17): mean RA 9, PA 70/23 mean 43, PCWP mean 20, CI 2.96 Fick/2.84 Thermo, PVR 4.3 WU.  - Echo (9/18): severe LV dilation, EF 20-25% with WMAs, s/p MV repair with mild MR, moderate AI, moderate TR, PASP 68 mmHg, normal RV size and systolic function.  - Echo (7/19): mildly dilated  LV, EF 25% with wall motion abnormalities, mild RV dilation/mild decreased function, mild to moderate AI, stable repaired mitral valve (no MS, mild MR), PASP 76 mmHg.   - CPX (8/19): peak VO2 18.9, VE/VCO2 slope 40, RER 1.09 => moderate HF limitation.  - RHC (8/19): mean RA 14, PA 75/28 mean 47, mean PCWP 24, CI 2.44/PVR 5 Fick, CI 2.13/PVR 5.8 thermo.  - Echo (7/20): EF 15%, moderate-severe LV dilation, mild RV dilation with decreased systolic function, mild-moderate  MR s/p MV repair, mild-moderate AI, PASP 40 mmHg.  - CPX (1/21): Peak VO2 20, VE/VCO2 slope 46, RER 1.05. Moderate HF limitation, similar to prior.  12. Carotid stenosis: Carotid dopplers (1/16) with 40-59% bilateral ICA stenosis. Carotid dopplers (4/17) with 40-59% BICA stenosis.  - Carotid dopplers (2/18) with < 50% BICA stenosis.  - Carotid dopplers (10/20): Mild stenosis.  13. Aortic insufficiency: Mild-moderate by last echo.  14. Pulmonary HTN: Mixed PAH and pulmonary venous hypertension.  PFTs (9/14) were normal.  V/Q scan (2/16) with no evidence of acute or chronic PE.   - Unable to tolerate Opsumit due to extensive peripheral edema.  15. OSA: Moderate on 5/16 sleep study. Unable to tolerate CPAP.  Repeat sleep study at [facility] in 2/18 was also suggestive of OSA.  16. Venous insufficiency 17. Ventral hernia 18. Bradycardia: Holter (5/18) with overnight pauses up to 4.7 sec (likely due to OSA), occasional short atrial tachycardia runs, no atrial fibrillation, avg HR 70s, 3% PVCs.  - Event monitor (6/18): Rare 3-4 second pauses at night, rare atrial fibrillation, rare runs of junctional bradycardia.   SH: Married, lives in [place], Engineer, water, nonsmoker  FH: CAD  ROS: All systems reviewed and negative except as per HPI.   Current Outpatient Medications  Medication Sig Dispense Refill  . ascorbic acid (VITAMIN C) 1000 MG tablet Take by mouth.    Marland Kitchen aspirin EC 81 MG tablet Take 1 tablet (81 mg total) by mouth daily.  30 tablet 3  . digoxin (LANOXIN) 0.125 MG tablet Take 1 tablet (0.125 mg total) by mouth daily. 90 tablet 3  . eplerenone (INSPRA) 25 MG tablet TAKE 1 TABLET BY MOUTH ONCE DAILY 30 tablet 11  . magnesium gluconate (MAGONATE) 500 MG tablet Take 500 mg by mouth 2 (two) times daily.     . metolazone (ZAROXOLYN) 2.5 MG tablet Take 2.5 mg by mouth as needed.     . Omega-3 Fatty Acids (FISH OIL PO) Take 360 mg by mouth daily.     . potassium chloride (K-DUR,KLOR-CON) 20 MEQ tablet Take 2.5 tablets (50 mEq total) by mouth daily. 225 tablet 3  . sacubitril-valsartan (ENTRESTO) 24-26 MG Take 1 tablet by mouth 2 (two) times daily. 60 tablet 5  . torsemide (DEMADEX) 20 MG tablet Take 3 tablets (60 mg total) by mouth every morning AND 1 tablet (20 mg total) every evening. 540 tablet 2   No current facility-administered medications for this encounter.   BP 118/60   Pulse 68   Wt 65.4 kg (144 lb 3.2 oz)   SpO2 98%   BMI (P) 20.69 kg/m    Wt Readings from Last 3 Encounters:  04/25/19 65.4 kg (144 lb 3.2 oz)  04/03/19 63.1 kg (139 lb 3.2 oz)  03/21/19 63.8 kg (140 lb 10.5 oz)    General: NAD Neck: No JVD, no thyromegaly or thyroid nodule.  Lungs: Clear to auscultation bilaterally with normal respiratory effort. CV: Nondisplaced PMI.  Heart regular S1/S2, no S3/S4, 1/6 SEM RUSB.  1+ ankle edema.  No carotid bruit.  Normal pedal pulses.  Abdomen: Soft, nontender, no hepatosplenomegaly, no distention.  Skin: Intact without lesions or rashes.  Neurologic: Alert and oriented x 3.  Psych: Normal affect. Extremities: No clubbing or cyanosis.  HEENT: Normal.   Assessment/Plan: 1. CAD: S/p LIMA-LAD.  He has decided not to take statins after reviewing the data (I did recommend taking a statin but we have agreed to disagree on this, he is taking red yeast  rice extract).  Lexiscan Cardiolite was done in 9/17 due to atypical chest pain.  This showed prior infarction with no ischemia.  No recurrence of chest  pain since that time despite ongoing exercise.   - Continue ASA 81 daily.  2. S/p mitral valve repair: The MV repair looked stable on 7/20 echo with no MS, mild-moderate MR.  3. Aortic insufficiency: Echo in 9/20 with mild-moderate AI, stable.  4. Chronic systolic CHF: Ischemic cardiomyopathy, EF 15% with mildly decreased RV systolic function on 3/81 echo.  Most recent CPX in 1/21 with moderate HF limitation, no change from prior.  He was volume overloaded on 8/19 RHC with severe pulmonary hypertension but preserved cardiac output.  He is doing better recently, NYHA class II.  He is not volume overloaded on exam.  - He is taking torsemide 60 qam/20 qpm, can continue this dose.  BMET today.   - Off bisoprolol with bradycardia.  - Continue digoxin 0.125 daily and check level today.   - Continue Entresto 24/26 bid.    - Continue eplerenone 25 (unable to tolerate increase).    - I have discussed increasing Entresto vs starting on dapagliflozin with him today.  He wants to think more about it and will let me know at next appt.      - He would be unlikely to benefit much from CRT, have reviewed with [name] (had IVCD not LBBB in the past, now today has RBBB).   5. Atrial fibrillation and atypical atrial flutter: s/p Maze in 10/14, then ablation in 3/15.  Prior to Maze, he was on Tikosyn but had breakthrough.  Currently, he is not anticoagulated due to history of prostate bleeding and his choice.  He did have his LA appendage oversewn at time of MV surgery.   1% atrial fibrillation on 4/19 event monitor.  Zio patch in 6/20 with short SVT runs (possible atrial fibrillation).  I think that he is in NSR today with low voltage P waves.  - He is off bisoprolol with bradycardia.    - We have had discussions about what to do with his atrial fibrillation/flutter.  He is symptomatic at times when in atrial fibrillation/fluttter. He had breakthrough on Tikosyn in the past, but has had Maze and atrial fibrillation  ablation since that time. He opted to hold off on trying Tikosyn again and currently feels like the atrial fibrillation/flutter burden is manageable.   6. Pulmonary hypertension: Severe by echo in 9/17 and on RHC in 10/17.  RV actually looked ok on 9/17 echo.  Mixed pulmonary venous and pulmonary arterial HTN on RHC.  It is possible that the [facility] component is due to pulmonary vascular remodeling in the setting of chronic mitral regurgitation prior to MV repair. Negative V/Q scan, no evidence for CTEPH.  PFTs normal in 9/14.  He is seeing Dr Lake Bells.  Sleep study showed moderate OSA but he has been unable to tolerate CPAP and is now using an oral device. PASP 76 mmHg by echo in 7/19.  He tried Engineer, site but stopped it so that he could take a nitric oxide supplement.  I repeated RHC in 8/19.  This showed severe mixed pulmonary venous/pulmonary arterial HTN with PVR 5.8 WU and preserved cardiac output. He was started on Opsumit but did not tolerate due to extensive peripheral edema.  - Breathing improved with Opsumit, but he was unable to tolerate due to severe peripheral edema.     - He stopped tadalafil again as he  did not feel like it helped (would only take 10 mg daily).  - We have discussed a trial of selexipag.  He will consider it in the future.   7. OSA: Moderate.  Has not tolerated CPAP. Probably plays a role in recurrent atrial fibrillation and pulmonary hypertension as well as nocturnal sinus pauses.  He has been using his oral device. 8. Carotid stenosis: Mild on 10/20 dopplers.   9. Bradycardia: Nocturnal bradycardia in the past likely related to OSA.  Developed more ongoing bradycardia and bisoprolol and digoxin stopped.  Restarted later on bisoprolol with afib/mild RVR but became bradycardic with pre-syncope and bisoprolol stopped. I think that he has sick sinus syndrome.   I suspect he will eventually need a PPM, but currently, his HR is adequate off bisoprolol, and he has tolerated restarting  digoxin.   - He will stay off beta blockers.  - Follow digoxin level.   Followup in 2 months.   Loralie Champagne 04/26/2019

## 2019-05-01 ENCOUNTER — Telehealth: Payer: Self-pay | Admitting: Family Medicine

## 2019-05-01 MED FILL — ENTRESTO 24 MG-26 MG TABLET: 24-26 | 30 days supply | Qty: 60 | Fill #2

## 2019-05-01 NOTE — Telephone Encounter (Signed)
See January 7 phone note tree

## 2019-05-01 NOTE — Telephone Encounter (Signed)
Fine with me, no more than 1 new patient per day

## 2019-05-01 NOTE — Telephone Encounter (Signed)
Per hunter last note  I am okay with transfer-please tell [name] I will miss him and it was an honor to be his physician for these last few years

## 2019-05-01 NOTE — Telephone Encounter (Signed)
Patient called in this morning saying that he is wanting to transfer to [facility] because its more convenient. Is this okay?

## 2019-05-09 MED FILL — EPLERENONE 25 MG TABS: 25 | 30 days supply | Qty: 30 | Fill #1

## 2019-05-16 NOTE — Progress Notes (Signed)
Discharge Progress Report  Patient Details  Name: Caleb GLASHEEN, PhD MRN: [id number] Date of Birth: 30-Oct-1940 Referring Provider:     Pulmonary Rehab Walk Test from 12/27/2018 in [facility]  Referring Provider  [name]       Number of Visits: 12 Reason for Discharge:  Patient reached a stable level of exercise. Patient independent in their exercise. Patient has met program and personal goals.  Smoking History:  Social History   Tobacco Use  Smoking Status Never Smoker  Smokeless Tobacco Never Used    Diagnosis:  51/8841 Chronic systolic congestive heart failure (HCC)  Heart failure, chronic systolic ([place])  ADL [facility]SD: Pulmonary Assessment Scores    Row Name 12/27/18 1544 03/23/19 1458       ADL [facility]SD   ADL Phase  Entry  Exit    SOB Score total  40  13      CAT Score   CAT Score  15  8      mMRC Score   mMRC Score  3  1       Initial Exercise Prescription: Initial Exercise Prescription - 12/27/18 1600      Date of Initial Exercise RX and Referring Provider   Date  12/27/18    Referring Provider  [name]      Treadmill   MPH  2.2    Grade  1    Minutes  15      NuStep   Level  3    SPM  80    Minutes  15      Prescription Details   Frequency (times per week)  2    Duration  Progress to 30 minutes of continuous aerobic without signs/symptoms of physical distress      Intensity   THRR 40-80% of Max Heartrate  56-112    Ratings of Perceived Exertion  11-13    Perceived Dyspnea  0-4      Progression   Progression  Continue to progress workloads to maintain intensity without signs/symptoms of physical distress.      Resistance Training   Training Prescription  Yes    Weight  orange bands    Reps  10-15       Discharge Exercise Prescription (Final Exercise Prescription Changes): Exercise Prescription Changes - 03/21/19 1500      Response to Exercise   Blood Pressure (Admit)  110/54    Blood Pressure  (Exercise)  120/60    Blood Pressure (Exit)  106/58    Heart Rate (Admit)  78 bpm    Heart Rate (Exercise)  97 bpm    Heart Rate (Exit)  79 bpm    Oxygen Saturation (Admit)  99 %    Oxygen Saturation (Exercise)  96 %    Oxygen Saturation (Exit)  99 %    Rating of Perceived Exertion (Exercise)  12    Perceived Dyspnea (Exercise)  1    Duration  Continue with 30 min of aerobic exercise without signs/symptoms of physical distress.    Intensity  THRR unchanged      Resistance Training   Training Prescription  Yes    Weight  orange bands    Reps  10-15    Time  10 Minutes      Treadmill   MPH  2.4    Grade  1    Minutes  15      NuStep   Level  4  SPM  80    Minutes  15    METs  2.8       Functional Capacity: 6 Minute Walk    Row Name 12/27/18 1559 03/23/19 1500       6 Minute Walk   Phase  Initial  Discharge    Distance  1500 feet  1628 feet    Distance % Change  --  8.53 %    Distance Feet Change  --  128 ft    Walk Time  6 minutes  6 minutes    # of Rest Breaks  0  0    MPH  2.84  3.08    METS  3.14  3.46    RPE  14  12    Perceived Dyspnea   3  1    VO2 Peak  10.89  12.1    Symptoms  No  No    Resting HR  86 bpm  76 bpm    Resting BP  128/74  112/50    Resting Oxygen Saturation   97 %  99 %    Exercise Oxygen Saturation  during 6 min walk  89 %  95 %    Max Ex. HR  97 bpm  101 bpm    Max Ex. BP  132/66  132/64    2 Minute Post BP  128/72  106/58      Interval HR   1 Minute HR  95  89    2 Minute HR  90  91    3 Minute HR  92  94    4 Minute HR  97  96    5 Minute HR  96  98    6 Minute HR  96  101    2 Minute Post HR  115 likely in Afib  84    Interval Heart Rate?  Yes  Yes      Interval Oxygen   Interval Oxygen?  Yes  Yes    Baseline Oxygen Saturation %  97 %  99 %    1 Minute Oxygen Saturation %  97 %  99 %    1 Minute Liters of Oxygen  0 L  0 L    2 Minute Oxygen Saturation %  97 %  95 %    2 Minute Liters of Oxygen  0 L  0 L    3 Minute  Oxygen Saturation %  89 %  96 %    3 Minute Liters of Oxygen  0 L  0 L    4 Minute Oxygen Saturation %  91 %  100 %    4 Minute Liters of Oxygen  0 L  0 L    5 Minute Oxygen Saturation %  90 %  100 %    5 Minute Liters of Oxygen  0 L  0 L    6 Minute Oxygen Saturation %  90 %  100 %    6 Minute Liters of Oxygen  0 L  0 L    2 Minute Post Oxygen Saturation %  98 %  100 %    2 Minute Post Liters of Oxygen  0 L  0 L       Psychological, QOL, Others - Outcomes: PHQ 2/9: Depression screen [facility] 2/9 12/27/2018 11/09/2018 09/27/2018 03/29/2018 01/19/2018  Decreased Interest 0 0 0 0 0  Down, Depressed, Hopeless 0 0 0 0 0  PHQ - 2 Score  0 0 0 0 0  Altered sleeping 2 - - - -  Tired, decreased energy 3 - - - -  Change in appetite 1 - - - -  Trouble concentrating 0 - - - -  Moving slowly or fidgety/restless 0 - - - -  Suicidal thoughts 0 - - - -  Difficult doing work/chores Not difficult at all - - - -  Some recent data might be hidden    Quality of Life:   Personal Goals: Goals established at orientation with interventions provided to work toward goal. Personal Goals and Risk Factors at Admission - 12/27/18 1551      Core Components/Risk Factors/Patient Goals on Admission   Improve shortness of breath with ADL's  Yes    Heart Failure  Yes    Intervention  --   monitor weights and medication compliance   Expected Outcomes  Improve functional capacity of life;Short term: Attendance in program 2-3 days a week with increased exercise capacity. Reported lower sodium intake. Reported increased fruit and vegetable intake. Reports medication compliance.;Short term: Daily weights obtained and reported for increase. Utilizing diuretic protocols set by physician.;Long term: Adoption of self-care skills and reduction of barriers for early signs and symptoms recognition and intervention leading to self-care maintenance.    Stress  Yes    Intervention  Offer individual and/or small group education and  counseling on adjustment to heart disease, stress management and health-related lifestyle change. Teach and support self-help strategies.;Refer participants experiencing significant psychosocial distress to appropriate mental health specialists for further evaluation and treatment. When possible, include family members and significant others in education/counseling sessions.    Expected Outcomes  Short Term: Participant demonstrates changes in health-related behavior, relaxation and other stress management skills, ability to obtain effective social support, and compliance with psychotropic medications if prescribed.;Long Term: Emotional wellbeing is indicated by absence of clinically significant psychosocial distress or social isolation.        Personal Goals Discharge: Goals and Risk Factor Review    Row Name 12/27/18 1552 01/11/19 1214 02/06/19 1145 03/06/19 1208       Core Components/Risk Factors/Patient Goals Review   Personal Goals Review  Stress;Improve shortness of breath with ADL's;Heart Failure;Develop more efficient breathing techniques such as purse lipped breathing and diaphragmatic breathing and practicing self-pacing with activity.  Heart Failure;Improve shortness of breath with ADL's;Develop more efficient breathing techniques such as purse lipped breathing and diaphragmatic breathing and practicing self-pacing with activity.  Heart Failure;Develop more efficient breathing techniques such as purse lipped breathing and diaphragmatic breathing and practicing self-pacing with activity.;Improve shortness of breath with ADL's  Heart Failure;Improve shortness of breath with ADL's;Develop more efficient breathing techniques such as purse lipped breathing and diaphragmatic breathing and practicing self-pacing with activity.;Stress    Review  --  Just started program, too early to see progression towards goals  Heart failure is stable, he is a pleasure to have in the program, exercising on level 4  of the nustep and 2.4 mph and 2% incline on the treadmill.  Has been absent d/t taking care of his wife which is a stressor, heart failure is stable, has become somewhat deconditioned d/t absences.    Expected Outcomes  --  See admission goals  See admission goals.  That Guillermo will be able to participate in pulmonary rehab to improve deconditioning when his wife's condition improves.       Exercise Goals and Review: Exercise Goals    Row Name 12/27/18 1606 01/10/19 1555 02/07/19  6283 03/06/19 1032       Exercise Goals   Increase Physical Activity  Yes  Yes  Yes  Yes    Intervention  Provide advice, education, support and counseling about physical activity/exercise needs.;Develop an individualized exercise prescription for aerobic and resistive training based on initial evaluation findings, risk stratification, comorbidities and participant's personal goals.  Provide advice, education, support and counseling about physical activity/exercise needs.;Develop an individualized exercise prescription for aerobic and resistive training based on initial evaluation findings, risk stratification, comorbidities and participant's personal goals.  Provide advice, education, support and counseling about physical activity/exercise needs.;Develop an individualized exercise prescription for aerobic and resistive training based on initial evaluation findings, risk stratification, comorbidities and participant's personal goals.  Provide advice, education, support and counseling about physical activity/exercise needs.;Develop an individualized exercise prescription for aerobic and resistive training based on initial evaluation findings, risk stratification, comorbidities and participant's personal goals.    Expected Outcomes  Short Term: Attend rehab on a regular basis to increase amount of physical activity.;Long Term: Exercising regularly at least 3-5 days a week.;Long Term: Add in home exercise to make exercise part of  routine and to increase amount of physical activity.  Short Term: Attend rehab on a regular basis to increase amount of physical activity.;Long Term: Exercising regularly at least 3-5 days a week.;Long Term: Add in home exercise to make exercise part of routine and to increase amount of physical activity.  Short Term: Attend rehab on a regular basis to increase amount of physical activity.;Long Term: Exercising regularly at least 3-5 days a week.;Long Term: Add in home exercise to make exercise part of routine and to increase amount of physical activity.  Short Term: Attend rehab on a regular basis to increase amount of physical activity.;Long Term: Exercising regularly at least 3-5 days a week.;Long Term: Add in home exercise to make exercise part of routine and to increase amount of physical activity.    Increase Strength and Stamina  Yes  Yes  Yes  Yes    Intervention  Provide advice, education, support and counseling about physical activity/exercise needs.;Develop an individualized exercise prescription for aerobic and resistive training based on initial evaluation findings, risk stratification, comorbidities and participant's personal goals.  Provide advice, education, support and counseling about physical activity/exercise needs.;Develop an individualized exercise prescription for aerobic and resistive training based on initial evaluation findings, risk stratification, comorbidities and participant's personal goals.  Provide advice, education, support and counseling about physical activity/exercise needs.;Develop an individualized exercise prescription for aerobic and resistive training based on initial evaluation findings, risk stratification, comorbidities and participant's personal goals.  Provide advice, education, support and counseling about physical activity/exercise needs.;Develop an individualized exercise prescription for aerobic and resistive training based on initial evaluation findings, risk  stratification, comorbidities and participant's personal goals.    Expected Outcomes  Short Term: Increase workloads from initial exercise prescription for resistance, speed, and METs.;Short Term: Perform resistance training exercises routinely during rehab and add in resistance training at home;Long Term: Improve cardiorespiratory fitness, muscular endurance and strength as measured by increased METs and functional capacity (6MWT)  Short Term: Increase workloads from initial exercise prescription for resistance, speed, and METs.;Short Term: Perform resistance training exercises routinely during rehab and add in resistance training at home;Long Term: Improve cardiorespiratory fitness, muscular endurance and strength as measured by increased METs and functional capacity (6MWT)  Short Term: Increase workloads from initial exercise prescription for resistance, speed, and METs.;Short Term: Perform resistance training exercises routinely during rehab and add in  resistance training at home;Long Term: Improve cardiorespiratory fitness, muscular endurance and strength as measured by increased METs and functional capacity (6MWT)  Short Term: Increase workloads from initial exercise prescription for resistance, speed, and METs.;Short Term: Perform resistance training exercises routinely during rehab and add in resistance training at home;Long Term: Improve cardiorespiratory fitness, muscular endurance and strength as measured by increased METs and functional capacity (6MWT)    Able to understand and use rate of perceived exertion (RPE) scale  Yes  Yes  Yes  Yes    Intervention  Provide education and explanation on how to use RPE scale  Provide education and explanation on how to use RPE scale  Provide education and explanation on how to use RPE scale  Provide education and explanation on how to use RPE scale    Expected Outcomes  Short Term: Able to use RPE daily in rehab to express subjective intensity level;Long Term:   Able to use RPE to guide intensity level when exercising independently  Short Term: Able to use RPE daily in rehab to express subjective intensity level;Long Term:  Able to use RPE to guide intensity level when exercising independently  Short Term: Able to use RPE daily in rehab to express subjective intensity level;Long Term:  Able to use RPE to guide intensity level when exercising independently  Short Term: Able to use RPE daily in rehab to express subjective intensity level;Long Term:  Able to use RPE to guide intensity level when exercising independently    Able to understand and use Dyspnea scale  Yes  Yes  Yes  Yes    Intervention  Provide education and explanation on how to use Dyspnea scale  Provide education and explanation on how to use Dyspnea scale  Provide education and explanation on how to use Dyspnea scale  Provide education and explanation on how to use Dyspnea scale    Expected Outcomes  Short Term: Able to use Dyspnea scale daily in rehab to express subjective sense of shortness of breath during exertion;Long Term: Able to use Dyspnea scale to guide intensity level when exercising independently  Short Term: Able to use Dyspnea scale daily in rehab to express subjective sense of shortness of breath during exertion;Long Term: Able to use Dyspnea scale to guide intensity level when exercising independently  Short Term: Able to use Dyspnea scale daily in rehab to express subjective sense of shortness of breath during exertion;Long Term: Able to use Dyspnea scale to guide intensity level when exercising independently  Short Term: Able to use Dyspnea scale daily in rehab to express subjective sense of shortness of breath during exertion;Long Term: Able to use Dyspnea scale to guide intensity level when exercising independently    Knowledge and understanding of Target Heart Rate Range (THRR)  Yes  Yes  Yes  Yes    Intervention  Provide education and explanation of THRR including how the numbers  were predicted and where they are located for reference  Provide education and explanation of THRR including how the numbers were predicted and where they are located for reference  Provide education and explanation of THRR including how the numbers were predicted and where they are located for reference  Provide education and explanation of THRR including how the numbers were predicted and where they are located for reference    Expected Outcomes  Long Term: Able to use THRR to govern intensity when exercising independently;Short Term: Able to state/look up THRR;Short Term: Able to use daily as guideline for intensity  in rehab  Long Term: Able to use THRR to govern intensity when exercising independently;Short Term: Able to state/look up THRR;Short Term: Able to use daily as guideline for intensity in rehab  Long Term: Able to use THRR to govern intensity when exercising independently;Short Term: Able to state/look up THRR;Short Term: Able to use daily as guideline for intensity in rehab  Long Term: Able to use THRR to govern intensity when exercising independently;Short Term: Able to state/look up THRR;Short Term: Able to use daily as guideline for intensity in rehab    Understanding of Exercise Prescription  Yes  Yes  Yes  Yes    Intervention  Provide education, explanation, and written materials on patient's individual exercise prescription  Provide education, explanation, and written materials on patient's individual exercise prescription  Provide education, explanation, and written materials on patient's individual exercise prescription  Provide education, explanation, and written materials on patient's individual exercise prescription    Expected Outcomes  Short Term: Able to explain program exercise prescription;Long Term: Able to explain home exercise prescription to exercise independently  Short Term: Able to explain program exercise prescription;Long Term: Able to explain home exercise prescription to  exercise independently  Short Term: Able to explain program exercise prescription;Long Term: Able to explain home exercise prescription to exercise independently  Short Term: Able to explain program exercise prescription;Long Term: Able to explain home exercise prescription to exercise independently       Exercise Goals Re-Evaluation: Exercise Goals Re-Evaluation    Row Name 01/10/19 1555 02/07/19 0711 03/06/19 1032         Exercise Goal Re-Evaluation   Exercise Goals Review  Increase Physical Activity;Increase Strength and Stamina;Able to understand and use rate of perceived exertion (RPE) scale;Able to understand and use Dyspnea scale;Knowledge and understanding of Target Heart Rate Range (THRR);Understanding of Exercise Prescription  Increase Physical Activity;Increase Strength and Stamina;Able to understand and use rate of perceived exertion (RPE) scale;Able to understand and use Dyspnea scale;Knowledge and understanding of Target Heart Rate Range (THRR);Understanding of Exercise Prescription  Increase Physical Activity;Increase Strength and Stamina;Able to understand and use rate of perceived exertion (RPE) scale;Able to understand and use Dyspnea scale;Knowledge and understanding of Target Heart Rate Range (THRR);Understanding of Exercise Prescription     Comments  Pt has completed 2 exercise session. Pt had previously finished cardiac rehab a couple of months prior. Pt is motivated to increase his workloads. Pt exercises at 2.7 METs on the stepper. Will continue to monitor and progress as able.  Pt has completed 7 exercise sessions. Pt currently exercises at 2.7 METs on the stepper. Pt has been able to increase his workloads on both the stepper and treadmill. Will continue to monitor and progress as able.  Pt has completed 9 exercise sessions. Pt has missed several visits recently due to taking care of his wife who just had surgery. This has greatly fatigued him. Pt last exercised at 2.6 METs on  the stepper.     Expected Outcomes  Through exercise at rehab and at home, the patient will decrease shortness of breath with daily activities and feel confident in carrying out an exercise regime at home.  Through exercise at rehab and at home, the patient will decrease shortness of breath with daily activities and feel confident in carrying out an exercise regime at home.  Through exercise at rehab and at home, the patient will decrease shortness of breath with daily activities and feel confident in carrying out an exercise regime at home.  Nutrition & Weight - Outcomes: Pre Biometrics - 12/27/18 1519      Pre Biometrics   Height  _0  (1.778 m)  (Pended)         Nutrition:   Nutrition Discharge:   Education Questionnaire Score: Knowledge Questionnaire Score - 03/23/19 1458      Knowledge Questionnaire Score   Post Score  12/18       Goals reviewed with patient; copy given to patient.

## 2019-05-16 NOTE — Addendum Note (Signed)
Encounter addended by: Lance Morin, RN on: 05/16/2019 3:36 PM  Actions taken: Clinical Note Signed, Episode resolved

## 2019-05-25 ENCOUNTER — Encounter: Payer: Self-pay | Admitting: Internal Medicine

## 2019-05-25 ENCOUNTER — Other Ambulatory Visit: Payer: Self-pay

## 2019-05-25 ENCOUNTER — Ambulatory Visit (INDEPENDENT_AMBULATORY_CARE_PROVIDER_SITE_OTHER): Payer: Medicare Other | Admitting: Internal Medicine

## 2019-05-25 VITALS — BP 124/68 | HR 77 | Temp 97.7°F | Ht 70.0 in | Wt 149.0 lb

## 2019-05-25 DIAGNOSIS — R1900 Intra-abdominal and pelvic swelling, mass and lump, unspecified site: Secondary | ICD-10-CM

## 2019-05-25 DIAGNOSIS — I5022 Chronic systolic (congestive) heart failure: Secondary | ICD-10-CM

## 2019-05-25 DIAGNOSIS — I10 Essential (primary) hypertension: Secondary | ICD-10-CM

## 2019-05-25 LAB — COMPREHENSIVE METABOLIC PANEL
ALT: 18 U/L (ref 0–53)
AST: 36 U/L (ref 0–37)
Albumin: 3.9 g/dL (ref 3.5–5.2)
Alkaline Phosphatase: 100 U/L (ref 39–117)
BUN: 22 mg/dL (ref 6–23)
CO2: 30 mEq/L (ref 19–32)
Calcium: 9.9 mg/dL (ref 8.4–10.5)
Chloride: 96 mEq/L (ref 96–112)
Creatinine, Ser: 1.1 mg/dL (ref 0.40–1.50)
GFR: 64.31 mL/min (ref >60.00)
Glucose, Bld: 103 mg/dL — ABNORMAL HIGH (ref 70–99)
Potassium: 4.1 mEq/L (ref 3.5–5.1)
Sodium: 134 mEq/L — ABNORMAL LOW (ref 135–145)
Total Bilirubin: 0.8 mg/dL (ref 0.2–1.2)
Total Protein: 7.7 g/dL (ref 6.0–8.3)

## 2019-05-25 LAB — CBC WITH DIFFERENTIAL/PLATELET
Basophils Absolute: 0.1 10*3/uL (ref 0.0–0.1)
Basophils Relative: 0.8 % (ref 0.0–3.0)
Eosinophils Absolute: 0.2 10*3/uL (ref 0.0–0.7)
Eosinophils Relative: 3.4 % (ref 0.0–5.0)
HCT: 37.3 % — ABNORMAL LOW (ref 39.0–52.0)
Hemoglobin: 12.6 g/dL — ABNORMAL LOW (ref 13.0–17.0)
Lymphocytes Relative: 25.9 % (ref 12.0–46.0)
Lymphs Abs: 1.7 10*3/uL (ref 0.7–4.0)
MCHC: 33.7 g/dL (ref 30.0–36.0)
MCV: 90.2 fl (ref 78.0–100.0)
Monocytes Absolute: 0.7 10*3/uL (ref 0.1–1.0)
Monocytes Relative: 10.6 % (ref 3.0–12.0)
Neutro Abs: 3.9 10*3/uL (ref 1.4–7.7)
Neutrophils Relative %: 59.3 % (ref 43.0–77.0)
Platelets: 244 10*3/uL (ref 150.0–400.0)
RBC: 4.13 Mil/uL — ABNORMAL LOW (ref 4.22–5.81)
RDW: 18 % — ABNORMAL HIGH (ref 11.5–15.5)
WBC: 6.5 10*3/uL (ref 4.0–10.5)

## 2019-05-25 NOTE — Patient Instructions (Signed)
We will get an ultrasound of the stomach and labs.

## 2019-05-25 NOTE — Progress Notes (Signed)
   Subjective:   Patient ID: Caleb Moment, PhD, male    DOB: [birth year], 79 y.o.   MRN: [id number]  HPI The patient is an 79 YO man coming in for transfer of providers and concerns about abdominal pain/swelling (left sided upper, rare, some constipation from his multiple diuretics, taking otc medication for constipation only sometimes, denies change to medicines recently), and blood pressure (no cbc or lfts in some time and he wants to make sure not changed, denies headaches or chest pains, taking entresto and eplerenone and metolazone and torsemide and digoxin) and heart failure ([place] to see [place] and sees [place] locally, no flare today, SOB stable with some fatigue, urinating well with diuretics).   PMH, [facility], social history reviewed and updated  Review of Systems  Constitutional: Positive for fatigue.  HENT: Negative.   Eyes: Negative.   Respiratory: Positive for shortness of breath. Negative for cough and chest tightness.   Cardiovascular: Negative for chest pain, palpitations and leg swelling.  Gastrointestinal: Positive for abdominal distention, abdominal pain and constipation. Negative for anal bleeding, blood in stool, diarrhea, nausea, rectal pain and vomiting.  Musculoskeletal: Negative.   Skin: Negative.   Neurological: Negative.   Psychiatric/Behavioral: Negative.     Objective:  Physical Exam Constitutional:      Appearance: He is well-developed.  HENT:     Head: Normocephalic and atraumatic.  Cardiovascular:     Rate and Rhythm: Normal rate.  Pulmonary:     Effort: Pulmonary effort is normal. No respiratory distress.     Breath sounds: Normal breath sounds. No wheezing or rales.  Abdominal:     General: Bowel sounds are normal. There is distension.     Palpations: Abdomen is soft.     Tenderness: There is no abdominal tenderness. There is no rebound.     Comments: Fullness of the abdomen without fluid wave  Musculoskeletal:   Cervical back: Normal range of motion.  Skin:    General: Skin is warm and dry.  Neurological:     Mental Status: He is alert and oriented to person, place, and time.     Coordination: Coordination normal.     Vitals:   05/25/19 1103  BP: 124/68  Pulse: 77  Temp: 97.7 F (36.5 C)  TempSrc: Oral  SpO2: 98%  Weight: 149 lb (67.6 kg)  Height: _0  (1.778 m)    This visit occurred during the SARS-CoV-2 public health emergency.  Safety protocols were in place, including screening questions prior to the visit, additional usage of staff PPE, and extensive cleaning of exam room while observing appropriate contact time as indicated for disinfecting solutions.   Assessment & Plan:

## 2019-05-26 DIAGNOSIS — R1900 Intra-abdominal and pelvic swelling, mass and lump, unspecified site: Secondary | ICD-10-CM | POA: Insufficient documentation

## 2019-05-26 NOTE — Assessment & Plan Note (Signed)
Could be related to heart failure or constipation. Checking CBC and CMP and US abdomen.

## 2019-05-26 NOTE — Assessment & Plan Note (Signed)
No flare today. Continue current meds.

## 2019-05-26 NOTE — Assessment & Plan Note (Signed)
Checking CBC and CMP. Adjust as needed. BP at goal.

## 2019-06-01 ENCOUNTER — Telehealth (HOSPITAL_COMMUNITY): Payer: Self-pay | Admitting: *Deleted

## 2019-06-01 MED FILL — ENTRESTO 24 MG-26 MG TABLET: 24-26 | 30 days supply | Qty: 60 | Fill #3

## 2019-06-02 ENCOUNTER — Telehealth (HOSPITAL_COMMUNITY): Payer: Self-pay | Admitting: Internal Medicine

## 2019-06-02 ENCOUNTER — Telehealth (HOSPITAL_COMMUNITY): Payer: Self-pay | Admitting: Cardiology

## 2019-06-02 DIAGNOSIS — R0609 Other forms of dyspnea: Secondary | ICD-10-CM

## 2019-06-02 NOTE — Addendum Note (Signed)
Addended by: Kerry Dory on: 06/02/2019 03:41 PM   Modules accepted: Orders

## 2019-06-02 NOTE — Telephone Encounter (Signed)
Patient called to request an updated referral for pulm rehab Reports he was unable to complete program due to covid restrictions/ shut downs, new referral placed

## 2019-06-06 ENCOUNTER — Telehealth (HOSPITAL_COMMUNITY): Payer: Self-pay | Admitting: *Deleted

## 2019-06-08 ENCOUNTER — Other Ambulatory Visit: Payer: Self-pay | Admitting: Internal Medicine

## 2019-06-08 ENCOUNTER — Ambulatory Visit
Admission: RE | Admit: 2019-06-08 | Discharge: 2019-06-08 | Disposition: A | Discharge status: Home / Self Care | Payer: Medicare Other | Source: Ambulatory Visit | Attending: Internal Medicine | Admitting: Internal Medicine

## 2019-06-08 ENCOUNTER — Other Ambulatory Visit: Payer: Self-pay

## 2019-06-08 DIAGNOSIS — K769 Liver disease, unspecified: Secondary | ICD-10-CM

## 2019-06-08 DIAGNOSIS — K8020 Calculus of gallbladder without cholecystitis without obstruction: Secondary | ICD-10-CM | POA: Diagnosis not present

## 2019-06-08 DIAGNOSIS — R1900 Intra-abdominal and pelvic swelling, mass and lump, unspecified site: Secondary | ICD-10-CM

## 2019-06-09 MED FILL — EPLERENONE 25 MG TABS: 25 | 30 days supply | Qty: 30 | Fill #2

## 2019-06-19 ENCOUNTER — Other Ambulatory Visit: Payer: Self-pay

## 2019-06-19 ENCOUNTER — Other Ambulatory Visit: Payer: Self-pay | Admitting: Internal Medicine

## 2019-06-19 ENCOUNTER — Ambulatory Visit
Admission: RE | Admit: 2019-06-19 | Discharge: 2019-06-19 | Disposition: A | Discharge status: Home / Self Care | Payer: Medicare Other | Source: Ambulatory Visit | Attending: Internal Medicine | Admitting: Internal Medicine

## 2019-06-19 DIAGNOSIS — C183 Malignant neoplasm of hepatic flexure: Secondary | ICD-10-CM

## 2019-06-19 DIAGNOSIS — C787 Secondary malignant neoplasm of liver and intrahepatic bile duct: Secondary | ICD-10-CM | POA: Diagnosis not present

## 2019-06-19 DIAGNOSIS — K769 Liver disease, unspecified: Secondary | ICD-10-CM

## 2019-06-19 MED ORDER — IOPAMIDOL (ISOVUE-300) INJECTION 61%
100.0000 mL | Freq: Once | INTRAVENOUS | Status: AC | PRN
  Administered 2019-06-19: 100 mL via INTRAVENOUS

## 2019-06-19 NOTE — Telephone Encounter (Signed)
Olivia Mackie from imaging called to confirm CT results.

## 2019-06-20 ENCOUNTER — Telehealth: Payer: Self-pay | Admitting: Gastroenterology

## 2019-06-20 NOTE — Telephone Encounter (Signed)
Patient scheduled tomorrow 3/31 with Dr. Melanee Left

## 2019-06-20 NOTE — Telephone Encounter (Signed)
Hey Dr. Bryan Lemma- This patient is being referred to [place] for malignant neoplasm of hepatic flexure from Dr. Sharlet Salina at [facility]. I spoke with patient today and he is being referred to Dr. Hilarie Fredrickson but he is out of the office all this week and patient does not exactly know urgency of this referral. He states that he had a CT yesterday that showed cancer but they do not know what stage so he is needing a colorectal biopsy. Was told to send to DOD when we received the referral (3/30 AM) to see how soon patient should be seen. Please advise. Thank you!

## 2019-06-20 NOTE — Telephone Encounter (Signed)
CT results reviewed, and highly concerning for Colon CA at the hepatic flexure with multiple hepatic lesions.  This patient needs to be seen ASAP.  If I do not have any availability in my clinic tomorrow, to be seen by one of the APP's next available, then can be set up for colonoscopy with which ever physician has the first available [place] opening so to expedite this process.

## 2019-06-21 ENCOUNTER — Ambulatory Visit (INDEPENDENT_AMBULATORY_CARE_PROVIDER_SITE_OTHER): Payer: Medicare Other | Admitting: Gastroenterology

## 2019-06-21 ENCOUNTER — Encounter: Payer: Self-pay | Admitting: Gastroenterology

## 2019-06-21 VITALS — BP 120/60 | HR 68 | Temp 98.2°F | Ht 70.0 in | Wt 142.0 lb

## 2019-06-21 DIAGNOSIS — R194 Change in bowel habit: Secondary | ICD-10-CM

## 2019-06-21 DIAGNOSIS — C183 Malignant neoplasm of hepatic flexure: Secondary | ICD-10-CM | POA: Diagnosis not present

## 2019-06-21 DIAGNOSIS — C787 Secondary malignant neoplasm of liver and intrahepatic bile duct: Secondary | ICD-10-CM

## 2019-06-21 NOTE — Progress Notes (Signed)
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 fib, HTN. HLD, GERD,  H/o TIA, ischemic cardiomyopathy, OSA (does not tolerate CPAP) referred to the Gastroenterology Clinic for expedited evaluation of after CT earlier this week highly concerning for metastatic colon cancer as outlined below.  Radiographic evaluation performed for left-sided abdominal pain and swelling for the last several months.  Was seen by his new PCM for this issue on 05/25/2019 with expedited evaluation as below.  Follows with [name] in the advanced heart failure clinic.  Echo in 09/2017 showed EF 25%.  Right heart cath in 10/2017 with severe pulmonary arterial hypertension and preserved cardiac output.  Most recent echo in 09/2018 with EF 15%, moderate-severe LV dilation, mild RV dilation, mild-moderate MR s/p MV repair, mild-moderate AI  States he developed left-sided abdominal swelling about 8-10 months ago, and thought this was due to volume overload.  Developed change in bowel habits over the last few months, described as decreased stool frequency, change in stool consistency, but not necessarily pencil thin stools.  No hematochezia or melena.  Left-sided abdominal pain developed over the last 1-2 months, prompting evaluation as below.   -CEA ordered by PCM  -06/19/2019, CT abdomen/pelvis: Numerous heterogenous low-density masses throughout the liver, measuring up to 6.1 cm.  Annular mass at the hepatic flexure measuring 3.6 cm with mesocolic adenopathy with clustered nodules measuring up to 14 mm adjacent to the mass without obstruction or perforation.  Massive enlargement of the prostate without asymmetry measuring up to 10 cm. -06/08/2019, abdominal ultrasound: Mild cholelithiasis without cholecystitis, normal CBD.  Course echotexture  c/w steatosis and evidence of multiple masses with a 3.7 cm hypoechoic right lobe mass, 3.7 cm hyperechoic right lobe mass, with additional hyperechoic masses measuring 4.8 cm -05/25/2019: Normal liver enzymes, NA 134 otherwise normal BMP.  H/H 12.6/37.3 with MCV/RDW 90/18, normal WBC and PLT -08/17/2018: H/H 14.4/44, MCV/RDW 93/15.8   Past Medical History:  Diagnosis Date  . Allergic rhinitis   . Atrial fibrillation ([place])   . Atypical pneumonia   . CAD (coronary artery disease)   . Cardiomyopathy, ischemic   . Chronic anticoagulation   . Cough   . Dizziness   . GERD (gastroesophageal reflux disease)   . Heart failure, systolic, acute on chronic ([place])   . Hyperlipidemia   . Hypertension   . OSA (obstructive sleep apnea)    Home sleep test 07/05/2009 AHI 8.2  . Pleural effusion   . Positional vertigo   . TIA (transient ischemic attack)      Past Surgical History:  Procedure Laterality Date  . AORTIC VALVE REPAIR  01/09/13  . CARDIAC CATHETERIZATION N/A 01/10/2016   Procedure: Right Heart Cath;  Surgeon: Larey Dresser, MD;  Location: [facility];  Service: Cardiovascular;  Laterality: N/A;  . CORONARY ARTERY BYPASS GRAFT  01/09/13   LAD LIMA, left atrial appendage  . CORONARY STENT PLACEMENT  2004   LAD  . MITRAL VALVE ANNULOPLASTY  01/09/13  . RIGHT HEART CATH N/A 11/16/2017   Procedure: RIGHT HEART CATH;  Surgeon: Larey Dresser, MD;  Location: [facility];  Service: Cardiovascular;  Laterality: N/A;  . RIGHT HEART CATHETERIZATION N/A 05/11/2014   Procedure:  RIGHT HEART CATH;  Surgeon: Larey Dresser, MD;  Location: [facility] CATH LAB;  Service: Cardiovascular;  Laterality: N/A;  . SHOULDER SURGERY    . TEE WITHOUT CARDIOVERSION N/A 09/21/2012   Procedure: TRANSESOPHAGEAL ECHOCARDIOGRAM (TEE);  Surgeon: Larey Dresser, MD;  Location: [facility] ENDOSCOPY;  Service: Cardiovascular;  Laterality: N/A;   Family History  Adopted: Yes  Problem Relation Age of Onset  . Diabetes  Other        [place]  . Heart disease Neg Hx   . Hypertension Neg Hx    Social History   Tobacco Use  . Smoking status: Never Smoker  . Smokeless tobacco: Never Used  Substance Use Topics  . Alcohol use: No  . Drug use: No   Current Outpatient Medications  Medication Sig Dispense Refill  . ascorbic acid (VITAMIN C) 1000 MG tablet Take by mouth.    Marland Kitchen aspirin EC 81 MG tablet Take 1 tablet (81 mg total) by mouth daily. 30 tablet 3  . digoxin (LANOXIN) 0.125 MG tablet Take 1 tablet (0.125 mg total) by mouth daily. 90 tablet 3  . eplerenone (INSPRA) 25 MG tablet TAKE 1 TABLET BY MOUTH ONCE DAILY 30 tablet 11  . magnesium gluconate (MAGONATE) 500 MG tablet Take 500 mg by mouth 2 (two) times daily.     . metolazone (ZAROXOLYN) 2.5 MG tablet Take 2.5 mg by mouth as needed.     . Omega-3 Fatty Acids (FISH OIL PO) Take 360 mg by mouth daily.     . potassium chloride (K-DUR,KLOR-CON) 20 MEQ tablet Take 2.5 tablets (50 mEq total) by mouth daily. 225 tablet 3  . sacubitril-valsartan (ENTRESTO) 24-26 MG Take 1 tablet by mouth 2 (two) times daily. 60 tablet 5  . torsemide (DEMADEX) 20 MG tablet Take 3 tablets (60 mg total) by mouth every morning AND 1 tablet (20 mg total) every evening. 540 tablet 2   No current facility-administered medications for this visit.   Allergies  Allergen Reactions  . Carvedilol Other (See Comments)    Dizziness, uneasiness, alopecia  . Codeine     Rapid thoughts, hallucinations  . Opsumit [Macitentan] Swelling    Per patient, caused extreme ankle swelling.   . Dabigatran Rash  . Pradaxa [Dabigatran Etexilate Mesylate] Rash  . Sulfonamide Derivatives Rash  . Xarelto [Rivaroxaban] Rash     Review of Systems: All systems reviewed and negative except where noted in HPI.     Physical Exam:    Wt Readings from Last 3 Encounters:  06/21/19 142 lb (64.4 kg)  05/25/19 149 lb (67.6 kg)  04/25/19 144 lb 3.2 oz (65.4 kg)    BP 120/60   Pulse 68   Temp 98.2 F  (36.8 C)   Ht _0  (1.778 m)   Wt 142 lb (64.4 kg)   BMI 20.37 kg/m  Constitutional:  Pleasant, in no acute distress. Psychiatric: Normal mood and affect. Behavior is normal. Neurological: Alert and oriented to person place and time. Skin: Skin is warm and dry. No rashes noted.   ASSESSMENT AND PLAN;   1) Mass In Hepatic Flexure 2) Hepatic metastasis 3) Change in bowel habits 4) CAD/CABG 5) CHF with EF 15% 6) Ischemic cardiomyopathy  I had a long discussion today with [name] and his wife regarding recent abdominal imaging notable for annular mass at the hepatic flexure and numerous heterogenous masses throughout the liver, the largest measuring up to 6.1 cm.  Would typically approach with colonoscopy in order to  establish tissue diagnosis.  However, given his significant cardiac history, with most recent EF 15%, he is quite reluctant to undergo a procedure with increased cardiac strain and I tend to agree.  Given the numerous masses in the liver, query approaching with IR guided biopsy, and if establishing adenocarcinoma of colonic origin, could arrive at the same answer with reduced periprocedural risks.  Additionally, given his extensive cardiac history, he had many questions regarding nonchemotherapy based treatment strategies, to include immunotherapy, radiation, etc.  I answered these questions to the best of my ability, but did direct him to have that same conversation with the consulting Oncologist when he has his appointment, as they are much more well versed in conventional and nonconventional therapies.  Has had change in bowel habits which can certainly be attributed to the colonic mass, but not necessarily obstructed clinically or radiographically.  We did briefly discuss colonoscopy with stent placement, but again, concern for risks outweigh benefit at this juncture given cardiac concerns.  He has a referral already in place to the Oncology clinic.  I sent a separate  staff message to discuss much of the above issues, to include plan to hold off on any diagnostic colonoscopy at this juncture, to the Oncology Nurse Navigator.  Will need CT chest to complete staging.  His son is an Anesthesiologist in [place] and the patient gives [place] permission to discuss his medical care with his son if/when he calls for updates.  I spent 65 minutes of time, including in depth chart review, independent review of results as outlined above, communicating results with the patient and his wife directly, face-to-face time with the patient, coordinating care, ordering studies and medications as appropriate, and documentation.     Lavena Bullion, DO, FACG  06/21/2019, 4:14 PM   Hoyt Koch, *

## 2019-06-21 NOTE — Patient Instructions (Signed)
If you are age 81 or older, your body mass index should be between 23-30. Your Body mass index is 20.37 kg/m. If this is out of the aforementioned range listed, please consider follow up with your Primary Care Provider.  If you are age 36 or younger, your body mass index should be between 19-25. Your Body mass index is 20.37 kg/m. If this is out of the aformentioned range listed, please consider follow up with your Primary Care Provider.   It was a pleasure to see you today!  Dr. Bryan Lemma

## 2019-06-22 NOTE — Progress Notes (Signed)
Spoke with patient introduced myself as GI Nurse Navigator and explained my role.  Informed him I have scheduled him an appointment to come in tomorrow 4/2 at 1:30 to see medical oncology.  I asked that he arrive at least 15 minutes early to register.  He also was given my direct number as a resource for questions or concerns.  He verbalized an understanding and was appreciative of being seen so soon.

## 2019-06-23 ENCOUNTER — Other Ambulatory Visit: Payer: Self-pay | Admitting: Physician Assistant

## 2019-06-23 ENCOUNTER — Inpatient Hospital Stay: Payer: Medicare Other | Attending: Physician Assistant | Admitting: Physician Assistant

## 2019-06-23 ENCOUNTER — Encounter (HOSPITAL_COMMUNITY): Payer: Self-pay

## 2019-06-23 ENCOUNTER — Other Ambulatory Visit: Payer: Self-pay

## 2019-06-23 ENCOUNTER — Inpatient Hospital Stay: Payer: Medicare Other

## 2019-06-23 VITALS — BP 126/48 | HR 97 | Temp 98.3°F | Resp 18 | Ht 70.0 in | Wt 142.5 lb

## 2019-06-23 DIAGNOSIS — E785 Hyperlipidemia, unspecified: Secondary | ICD-10-CM

## 2019-06-23 DIAGNOSIS — C189 Malignant neoplasm of colon, unspecified: Secondary | ICD-10-CM

## 2019-06-23 DIAGNOSIS — I2510 Atherosclerotic heart disease of native coronary artery without angina pectoris: Secondary | ICD-10-CM

## 2019-06-23 DIAGNOSIS — Z8673 Personal history of transient ischemic attack (TIA), and cerebral infarction without residual deficits: Secondary | ICD-10-CM | POA: Diagnosis not present

## 2019-06-23 DIAGNOSIS — Z951 Presence of aortocoronary bypass graft: Secondary | ICD-10-CM

## 2019-06-23 DIAGNOSIS — I110 Hypertensive heart disease with heart failure: Secondary | ICD-10-CM | POA: Diagnosis not present

## 2019-06-23 DIAGNOSIS — C787 Secondary malignant neoplasm of liver and intrahepatic bile duct: Secondary | ICD-10-CM | POA: Diagnosis not present

## 2019-06-23 DIAGNOSIS — K6389 Other specified diseases of intestine: Secondary | ICD-10-CM

## 2019-06-23 DIAGNOSIS — C19 Malignant neoplasm of rectosigmoid junction: Secondary | ICD-10-CM

## 2019-06-23 DIAGNOSIS — Z8679 Personal history of other diseases of the circulatory system: Secondary | ICD-10-CM

## 2019-06-23 DIAGNOSIS — R590 Localized enlarged lymph nodes: Secondary | ICD-10-CM

## 2019-06-23 DIAGNOSIS — K219 Gastro-esophageal reflux disease without esophagitis: Secondary | ICD-10-CM | POA: Diagnosis not present

## 2019-06-23 DIAGNOSIS — I509 Heart failure, unspecified: Secondary | ICD-10-CM | POA: Insufficient documentation

## 2019-06-23 DIAGNOSIS — I255 Ischemic cardiomyopathy: Secondary | ICD-10-CM

## 2019-06-23 DIAGNOSIS — K5900 Constipation, unspecified: Secondary | ICD-10-CM | POA: Diagnosis not present

## 2019-06-23 DIAGNOSIS — I272 Pulmonary hypertension, unspecified: Secondary | ICD-10-CM | POA: Diagnosis not present

## 2019-06-23 DIAGNOSIS — C799 Secondary malignant neoplasm of unspecified site: Secondary | ICD-10-CM

## 2019-06-23 DIAGNOSIS — I4891 Unspecified atrial fibrillation: Secondary | ICD-10-CM

## 2019-06-23 DIAGNOSIS — Z7189 Other specified counseling: Secondary | ICD-10-CM

## 2019-06-23 LAB — CBC WITH DIFFERENTIAL (CANCER CENTER ONLY)
Abs Immature Granulocytes: 0.01 10*3/uL (ref 0.00–0.07)
Basophils Absolute: 0 10*3/uL (ref 0.0–0.1)
Basophils Relative: 1 %
Eosinophils Absolute: 0.1 10*3/uL (ref 0.0–0.5)
Eosinophils Relative: 1 %
HCT: 40.2 % (ref 39.0–52.0)
Hemoglobin: 12.9 g/dL — ABNORMAL LOW (ref 13.0–17.0)
Immature Granulocytes: 0 %
Lymphocytes Relative: 17 %
Lymphs Abs: 1.2 10*3/uL (ref 0.7–4.0)
MCH: 30.7 pg (ref 26.0–34.0)
MCHC: 32.1 g/dL (ref 30.0–36.0)
MCV: 95.7 fL (ref 80.0–100.0)
Monocytes Absolute: 0.7 10*3/uL (ref 0.1–1.0)
Monocytes Relative: 10 %
Neutro Abs: 5.1 10*3/uL (ref 1.7–7.7)
Neutrophils Relative %: 71 %
Platelet Count: 265 10*3/uL (ref 150–400)
RBC: 4.2 MIL/uL — ABNORMAL LOW (ref 4.22–5.81)
RDW: 14.5 % (ref 11.5–15.5)
WBC Count: 7.2 10*3/uL (ref 4.0–10.5)
nRBC: 0 % (ref 0.0–0.2)

## 2019-06-23 LAB — CMP (CANCER CENTER ONLY)
ALT: 26 U/L (ref 0–44)
AST: 56 U/L — ABNORMAL HIGH (ref 15–41)
Albumin: 3.7 g/dL (ref 3.5–5.0)
Alkaline Phosphatase: 124 U/L (ref 38–126)
Anion gap: 15 (ref 5–15)
BUN: 20 mg/dL (ref 8–23)
CO2: 28 mmol/L (ref 22–32)
Calcium: 9.4 mg/dL (ref 8.9–10.3)
Chloride: 96 mmol/L — ABNORMAL LOW (ref 98–111)
Creatinine: 1.18 mg/dL (ref 0.61–1.24)
GFR, Est AFR Am: >60 mL/min (ref >60)
GFR, Estimated: 58 mL/min — ABNORMAL LOW (ref >60)
Glucose, Bld: 106 mg/dL — ABNORMAL HIGH (ref 70–99)
Potassium: 3.9 mmol/L (ref 3.5–5.1)
Sodium: 139 mmol/L (ref 135–145)
Total Bilirubin: 1 mg/dL (ref 0.3–1.2)
Total Protein: 8.1 g/dL (ref 6.5–8.1)

## 2019-06-23 NOTE — Progress Notes (Signed)
Met with patient and his wife Caleb [name] after his initial medical oncology appointment with [facility] NP/Dr. Burr Medico. I had previously spoken to them on the phone.  They were provided my business card with my direct phone number and encouraged to call with any questions or concerns.  They both have an understanding that the plan is to proceed with obtaining a CT Chest and IR biopsy of the liver.  Once done we will get them back in for follow up to discuss the next steps.

## 2019-06-23 NOTE — Progress Notes (Unsigned)
Caleb [name] Male, 79 y.o., Nov 01, 1940 MRN:  [id number] Phone:  [phone] (H) PCP:  Hoyt Koch, MD Primary Cvg:  Medicare/Medicare Part A And B Next Appt With Radiology ([facility]-CT 2) 06/27/2019 at 9:30 AM  RE: BIOPSY Received: Today Message Contents  Aletta Edouard, MD  Lenore Cordia      [place] for US guided biopsy of liver lesion.   GY   Previous Messages  ----- Message -----  From: Lenore Cordia  Sent: 06/23/2019  3:11 PM EDT  To: Ir Procedure Requests  Subject: BIOPSY                      Procedure Requested: [place] CORE BIOPSY (LIVER)    Reason for Procedure: Suspicious stage IV CRC. CHF so unable to have colonoscopy. Please obtain multiple livre biopsies to confirm diagnosis.    Provider Requesting: Heilingoetter, Tobe Sos  Provider Telephone:  [phone]

## 2019-06-23 NOTE — Progress Notes (Signed)
Caleb [name] Male, 79 y.o., 15-Apr-1940 MRN:  [id number] Phone:  [phone] (H) PCP:  Hoyt Koch, MD Primary Cvg:  Medicare/Medicare Part A And B Next Appt With Cardiology 06/27/2019 at 1:40 PM  RE: BIOPSY Received: Today Message Contents  Aletta Edouard, MD  Lenore Cordia      [place] for US guided biopsy of liver lesion.   GY   Previous Messages  ----- Message -----  From: Lenore Cordia  Sent: 06/23/2019  3:11 PM EDT  To: Ir Procedure Requests  Subject: BIOPSY                      Procedure Requested: [place] CORE BIOPSY (LIVER)    Reason for Procedure: Suspicious stage IV CRC. CHF so unable to have colonoscopy. Please obtain multiple livre biopsies to confirm diagnosis.    Provider Requesting: Heilingoetter, Tobe Sos  Provider Telephone:  [phone]    Other Info: RAD EXAMS IN Epic

## 2019-06-23 NOTE — Patient Instructions (Signed)
-  We will need a liver biopsy. Be on the lookout from a phone call from interventional radiology soon to schedule this procedure. Once I have the results of your biopsy, I will call the pathology lab and ask them to send your sample for more molecular testing (foundation one).  -I ordered a CT scan of the chest. I actually ordered this for [facility] because I have had better luck getting things scheduled faster.   Constipation, Adult Constipation is when a person:  Poops (has a bowel movement) fewer times in a week than normal.  Has a hard time pooping.  Has poop that is dry, hard, or bigger than normal. Follow these instructions at home: Eating and drinking   Eat foods that have a lot of fiber, such as: ? Fresh fruits and vegetables. ? Whole grains. ? Beans.  Eat less of foods that are high in fat, low in fiber, or overly processed, such as: ? [place] fries. ? Hamburgers. ? Cookies. ? Candy. ? Soda.  Drink enough fluid to keep your pee (urine) clear or pale yellow. General instructions  Exercise regularly or as told by your doctor.  Go to the restroom when you feel like you need to poop. Do not hold it in.  Take over-the-counter and prescription medicines only as told by your doctor. These include any fiber supplements.  Do pelvic floor retraining exercises, such as: ? Doing deep breathing while relaxing your lower belly (abdomen). ? Relaxing your pelvic floor while pooping.  Watch your condition for any changes.  Keep all follow-up visits as told by your doctor. This is important. Contact a doctor if:  You have pain that gets worse.  You have a fever.  You have not pooped for 4 days.  You throw up (vomit).  You are not hungry.  You lose weight.  You are bleeding from the anus.  You have thin, pencil-like poop (stool). Get help right away if:  You have a fever, and your symptoms suddenly get worse.  You leak poop or have blood in your poop.  Your  belly feels hard or bigger than normal (is bloated).  You have very bad belly pain.  You feel dizzy or you faint. This information is not intended to replace advice given to you by your health care provider. Make sure you discuss any questions you have with your health care provider. Document Revised: 02/19/2017 Document Reviewed: 08/28/2015 Elsevier Patient Education  2020 [employer].

## 2019-06-23 NOTE — Progress Notes (Signed)
[place] [phone]   [phone]  CONSULT NOTE  REFERRING PHYSICIAN: Dr. Bryan Lemma MD  REASON FOR CONSULTATION:  Suspicious stage IV colorectal cancer  HPI Caleb Moment, Caleb [name] is a 79 y.o. male with a past medical history significant for CHF with last ejection fraction 10-15% (09/2018), pulmonary hypertension, history of atrial fibrillation, hypertension, reflux, CAD/CABG, hyperlipidemia, GERD, history of TIA, ischemic cardiomyopathy, and history of mitral valve pair is referred to the clinic for evaluation and recommendations regarding suspicious metastatic carcinoma.  The patient's symptoms first began approximately 2 to 3 months ago.  He had been noticing mild, intermittent, dull, left upper quadrant abdominal pain. He rates his pain a 2/10. Does not require pain medication. Around the same time, he began noticing changes in his bowel habits.  Previously, the patient's bowel habits were fairly regular, having achieved a bowel movement almost daily.  More recently, he started developing more constipation requiring stool softeners and laxatives.  He denies any presence of blood in his stools. He also notice abdominal distention. He initially attributed his symptoms to his CHF/volume overload.  The patient saw his PCP for this concern earlier this month.  He had a CT of the abdomen and pelvis performed on 06/19/2019 which demonstrated findings concerning for colon cancer at the hepatic flexure with mesenteric adenopathy and extensive hepatic metastatic disease.   The patient was then seen by his gastroenterologist, Dr. Bryan Lemma.  Given the patient's age and comorbidities with his congestive heart failure, it was determined that it would be high risk to perform a colonoscopy on the patient.  He was referred to the cancer center today for evaluation and recommendations regarding management of his condition.  Today, the patient is feeling fairly well.  He  states that he slept well last night and is not in any pain at this [name].  He denies any recent fever, chills, or night sweats.  His weight has been fairly stable over the last few months (reportedly hovering around 140); however, he notes he had a period approximately 5 months ago in which she had sudden weight loss of 9 pounds or so.  He notes increased fatigue when comparing his stamina to 1 year ago.  He denies any nausea or vomiting.  His last bowel movement was a couple days ago. He states that his appetite has been fairly healthy over the past few months.  He has pulmonary hypertension and has shortness of breath at baseline.  He denies any appreciable change in his shortness of breath.  He denies any chest pain or hemoptysis.  The patient has an enlarged prostate and takes medication for this concern.  Additionally, the patient is on Lasix and urinates frequently without any noticeable difficulty.  The patient is adopted and does not know of his family's medical history.  The patient is a retired Investment banker, operational.  He is accompanied today by his wife, Arbie Cookey.  He has 2 biological children and his wife has 2 children.  He denies any history of drug, smoking, or alcohol use.  He states he drinks approximately 1 alcoholic drink per year at this point time.   HPI  Past Medical History:  Diagnosis Date  . Allergic rhinitis   . Atrial fibrillation ([place])   . Atypical pneumonia   . CAD (coronary artery disease)   . Cardiomyopathy, ischemic   . Chronic anticoagulation   . Cough   . Dizziness   . GERD (gastroesophageal reflux disease)   .  Heart failure, systolic, acute on chronic ([place])   . Hyperlipidemia   . Hypertension   . OSA (obstructive sleep apnea)    Home sleep test 07/05/2009 AHI 8.2  . Pleural effusion   . Positional vertigo   . TIA (transient ischemic attack)     Past Surgical History:  Procedure Laterality Date  . AORTIC VALVE REPAIR  01/09/13  . CARDIAC  CATHETERIZATION N/A 01/10/2016   Procedure: Right Heart Cath;  Surgeon: Larey Dresser, MD;  Location: [facility];  Service: Cardiovascular;  Laterality: N/A;  . CORONARY ARTERY BYPASS GRAFT  01/09/13   LAD LIMA, left atrial appendage  . CORONARY STENT PLACEMENT  2004   LAD  . MITRAL VALVE ANNULOPLASTY  01/09/13  . RIGHT HEART CATH N/A 11/16/2017   Procedure: RIGHT HEART CATH;  Surgeon: Larey Dresser, MD;  Location: [facility];  Service: Cardiovascular;  Laterality: N/A;  . RIGHT HEART CATHETERIZATION N/A 05/11/2014   Procedure: RIGHT HEART CATH;  Surgeon: Larey Dresser, MD;  Location: [facility] CATH LAB;  Service: Cardiovascular;  Laterality: N/A;  . SHOULDER SURGERY    . TEE WITHOUT CARDIOVERSION N/A 09/21/2012   Procedure: TRANSESOPHAGEAL ECHOCARDIOGRAM (TEE);  Surgeon: Larey Dresser, MD;  Location: [facility] ENDOSCOPY;  Service: Cardiovascular;  Laterality: N/A;    Family History  Adopted: Yes  Problem Relation Age of Onset  . Diabetes Other        [place]  . Heart disease Neg Hx   . Hypertension Neg Hx     Social History Social History   Tobacco Use  . Smoking status: Never Smoker  . Smokeless tobacco: Never Used  Substance Use Topics  . Alcohol use: No  . Drug use: No    Allergies  Allergen Reactions  . Carvedilol Other (See Comments)    Dizziness, uneasiness, alopecia  . Codeine     Rapid thoughts, hallucinations  . Opsumit [Macitentan] Swelling    Per patient, caused extreme ankle swelling.   . Dabigatran Rash  . Pradaxa [Dabigatran Etexilate Mesylate] Rash  . Sulfonamide Derivatives Rash  . Xarelto [Rivaroxaban] Rash    Current Outpatient Medications  Medication Sig Dispense Refill  . ascorbic acid (VITAMIN C) 1000 MG tablet Take by mouth.    Marland Kitchen aspirin EC 81 MG tablet Take 1 tablet (81 mg total) by mouth daily. 30 tablet 3  . digoxin (LANOXIN) 0.125 MG tablet Take 1 tablet (0.125 mg total) by mouth daily. 90 tablet 3  . eplerenone (INSPRA) 25 MG  tablet TAKE 1 TABLET BY MOUTH ONCE DAILY 30 tablet 11  . magnesium gluconate (MAGONATE) 500 MG tablet Take 500 mg by mouth 2 (two) times daily.     . metolazone (ZAROXOLYN) 2.5 MG tablet Take 2.5 mg by mouth as needed.     . Omega-3 Fatty Acids (FISH OIL PO) Take 360 mg by mouth daily.     . potassium chloride (K-DUR,KLOR-CON) 20 MEQ tablet Take 2.5 tablets (50 mEq total) by mouth daily. 225 tablet 3  . sacubitril-valsartan (ENTRESTO) 24-26 MG Take 1 tablet by mouth 2 (two) times daily. 60 tablet 5  . torsemide (DEMADEX) 20 MG tablet Take 3 tablets (60 mg total) by mouth every morning AND 1 tablet (20 mg total) every evening. 540 tablet 2   No current facility-administered medications for this visit.    Review of Systems REVIEW OF SYSTEMS:   Review of Systems  Constitutional: Positive for fatigue. negative for appetite change, chills, fever  and unexpected weight change.  HENT: Negative for mouth sores, nosebleeds, sore throat and trouble swallowing.   Eyes: Negative for eye problems and icterus.  Respiratory: Positive for baseline shortness of breath. negative for cough, hemoptysis, and wheezing.  Cardiovascular: Negative for chest pain and leg swelling.  Gastrointestinal: Positive for mild intermittent left upper quadrant pain and constipation. negative for diarrhea, nausea and vomiting.  Genitourinary: Negative for bladder incontinence, difficulty urinating, dysuria, frequency and hematuria.   Musculoskeletal: Negative for back pain, gait problem, neck pain and neck stiffness.  Skin: Negative for itching and rash.  Neurological: Negative for dizziness, extremity weakness, gait problem, headaches, light-headedness and seizures.  Hematological: Negative for adenopathy. Does not bruise/bleed easily.  Psychiatric/Behavioral: Negative for confusion, depression and sleep disturbance. The patient is not nervous/anxious.     PHYSICAL EXAMINATION:  Blood pressure (!) 126/48, pulse 97,  temperature 98.3 F (36.8 C), temperature source Temporal, resp. rate 18, height _0  (1.778 m), weight 142 lb 8 oz (64.6 kg), SpO2 100 %.  ECOG PERFORMANCE STATUS: 1  Physical Exam  Constitutional: Oriented to person, place, and time and well-developed, well-nourished, and in no distress.  HENT:  Head: Normocephalic and atraumatic.  Mouth/Throat: Oropharynx is clear and moist. No oropharyngeal exudate.  Eyes: Conjunctivae are normal. Right eye exhibits no discharge. Left eye exhibits no discharge. No scleral icterus.  Neck: Normal range of motion. Neck supple.  Cardiovascular: Normal rate, regular rhythm, normal heart sounds and intact distal pulses.   Pulmonary/Chest: Effort normal and breath sounds normal. No respiratory distress. No wheezes. No rales.  Abdominal: Soft. Bowel sounds are normal. Exhibits no distension and no mass. There is no tenderness.  Musculoskeletal: Normal range of motion. Exhibits no edema.  Lymphadenopathy:    No cervical adenopathy.  Neurological: Alert and oriented to person, place, and time. Exhibits normal muscle tone. Gait normal. Coordination normal.  Skin: Skin is warm and dry. No rash noted. Not diaphoretic. No erythema. No pallor.  Psychiatric: Mood, memory and judgment normal.  Vitals reviewed.   LABORATORY DATA: Lab Results  Component Value Date   WBC 6.5 05/25/2019   HGB 12.6 (L) 05/25/2019   HCT 37.3 (L) 05/25/2019   MCV 90.2 05/25/2019   PLT 244.0 05/25/2019      Chemistry      Component Value Date/Time   NA 134 (L) 05/25/2019 1152   K 4.1 05/25/2019 1152   CL 96 05/25/2019 1152   CO2 30 05/25/2019 1152   BUN 22 05/25/2019 1152   CREATININE 1.10 05/25/2019 1152   CREATININE 0.99 11/19/2014 1020      Component Value Date/Time   CALCIUM 9.9 05/25/2019 1152   ALKPHOS 100 05/25/2019 1152   AST 36 05/25/2019 1152   ALT 18 05/25/2019 1152   BILITOT 0.8 05/25/2019 1152       RADIOGRAPHIC STUDIES: US Abdomen Complete  Result  Date: 06/08/2019 CLINICAL DATA:  Abdominal swelling 8 months. EXAM: ABDOMEN ULTRASOUND COMPLETE COMPARISON:  CT 02/08/2014 FINDINGS: Gallbladder: Mild cholelithiasis with largest stone measuring 6 mm. Gallbladder wall subjectively is normal in thickness although measurement reveals 4.3 mm. No adjacent free fluid. Negative sonographic Murphy sign. Common bile duct: Diameter: 3.1 mm. Liver: Coarse heterogeneous echotexture compatible with steatosis. Evidence of multiple masses with a rounded hypoechoic mass over the right lobe measuring 3.7 cm. Slightly hyperechoic mass over the right lobe measuring 3.7 cm. Additional hyperechoic mass with central decreased echogenicity over the right lobe measuring 4.8 cm. Portal vein is patent on color  Doppler imaging with normal direction of blood flow towards the liver. IVC: No abnormality visualized. Pancreas: Visualized portion unremarkable. Spleen: Size and appearance within normal limits. Right Kidney: Length: 11.1 cm. Echogenicity within normal limits. No mass or hydronephrosis visualized. Left Kidney: Length: 10.9 cm. Echogenicity within normal limits. 1.6 cm cyst over the upper pole. No mass or hydronephrosis visualized. Abdominal aorta: No aneurysm visualized. Other findings: None. IMPRESSION: 1. Mild cholelithiasis with largest stone measuring 6 mm. Equivocal wall thickening. Recommend clinical correlation for acute cholecystitis. 2. Coarse heterogeneous echogenicity of the liver with suggestion of multiple masses with the largest over the right lobe measuring 4.8 cm. Recommend CT with contrast for further evaluation. 3.  1.6 cm left renal cyst. Electronically Signed   By: Marin Olp M.D.   On: 06/08/2019 12:54   CT Abdomen Pelvis W Contrast  Result Date: 06/19/2019 CLINICAL DATA:  Left-sided abdominal pain for a few months. Liver lesion by ultrasound. EXAM: CT ABDOMEN AND PELVIS WITH CONTRAST TECHNIQUE: Multidetector CT imaging of the abdomen and pelvis was  performed using the standard protocol following bolus administration of intravenous contrast. CONTRAST:  118m ISOVUE-300 IOPAMIDOL (ISOVUE-300) INJECTION 61% COMPARISON:  Right upper quadrant ultrasound from 11 days ago FINDINGS: Lower chest: Low-density left ventricular thinning consistent with remote infarct. Hepatobiliary: There are numerous heterogeneous low-density masses throughout liver, the largest in the right lobe at 6.1 cm. No gross underlying cirrhotic changes.Calcified gallstone without inflammatory changes. Pancreas: Unremarkable. Spleen: Unremarkable. Adrenals/Urinary Tract: Negative adrenals. No hydronephrosis or stone. Small left upper pole renal cystic density. Unremarkable bladder. Stomach/Bowel: Annular mass at the hepatic flexure of the colon, measuring 3.6 cm in length. There is mesocolic adenopathy with clustered nodules measuring up to 14 mm adjacent to the mass. No bowel obstruction or perforation. Vascular/Lymphatic: Atherosclerotic calcification that is widespread. No mass or adenopathy. Reproductive:Massive enlargement of the prostate without asymmetry. Gland measures up to 10 cm craniocaudal. Other: No ascites or pneumoperitoneum. Small fatty epigastric hernia. Musculoskeletal: No acute abnormalities. No noted bony metastasis. L5 chronic bilateral pars defects with L5-S1 anterolisthesis. These results will be called to the ordering clinician or representative by the Radiologist Assistant, and communication documented in the PACS or [employer] IMPRESSION: 1. Findings of colon cancer at the hepatic flexure with mesenteric adenopathy and extensive hepatic metastatic disease. 2. Chronic findings are noted above. Electronically Signed   By: JMonte FantasiaM.D.   On: 06/19/2019 10:45    ASSESSMENT: This is a very pleasant 79year old Caucasian male with:  1.  Suspicious stage IV colorectal cancer of the right hepatic flexure with metastatic adenopathy and extensive hepatic  metastatic disease. -Initial CT scan on 06/19/2019 showed suspicious colorectal cancer with a right hepatic flexure mass with metastatic adenopathy in extensive hepatic metastatic disease.  -Patient at high risk for colonoscopy due to congestive heart failure (ejection fraction 10 to 15% 09/2018).  -The patient was seen with Dr. FBurr Medicotoday.  Dr. FBurr Medicohad a lengthy discussion with the patient about his current condition and recommendations for further work-up.  -Dr. FBurr Medicodiscussed that his condition is not curable but is treatable.  -Dr. FBurr Medicorecommends a less invasive approach for tissue confirmation of his malignancy.  We will arrange for the patient to have a ultrasound core biopsy of one of the liver lesions.  Once obtained, I will send the patient's biopsy for foundation 1 molecular testing so Dr. FBurr Medicocan further assess the best treatment option for him. -Dr. FBurr Medicodiscussed the possible risks associated with  a biopsy including discomfort, bleeding, and infection. -Due to the patient's congestive heart failure, Dr. Burr Medico discussed with the patient likely would not tolerate intensive chemotherapy.  -I will arrange for a CT scan of the chest to complete the staging work-up to rule out any metastatic disease in the chest -We will arrange for a follow-up in person visit a few days after his biopsy for more detailed discussion about his current condition and recommendations for treatment.  In the meantime, the patient may reach out to other institutions for second opinion, as he is interested. -Will order baseline labs today, CEA, CBC, CMP. CBC shows mild anemia 12.9. CMP shows electrolytes, LFT's, and creatinine unremarkable. CEA pending.   2. Constipation -Dr. Burr Medico encouraged the patient to continue to use stool softeners and laxatives.  The goal is to achieve a bowel movement daily.  -Dr. Burr Medico discussed that some of his abdominal pain may be attributed to gas pain. -Handout on constipation education  was given to the patient today.   PLAN: -Arrange for ultrasound guided core biopsy of the liver for tissue diagnosis -Send biopsy for molecular studies (foundation one) once pathology available -CT scan of the chest to complete the staging workup -CBC, CMP, CEA today.  -Dr. Burr Medico will follow up with the patient 2-3 days after his liver biopsy is performed.   Disclaimer: This note was dictated with voice recognition software. Similar sounding words can inadvertently be transcribed and may not be corrected upon review.   Aiden Rao L Viraaj Vorndran June 23, 2019, 2:13 PM  Addendum  I have seen the patient, examined him. I agree with the assessment and and plan and have edited the notes.   I reviewed his recent CT scan image in person with patient and his wife in detail.  This is highly suspicious for metastatic colon cancer to liver.  Due to his significant cardiomyopathy, I agree with Dr. Bryan Lemma the liver biopsy may be a safer approach than colonoscopy, for tissue diagnosis.  Patient is agreeable.  Due to his advanced age and significant comorbidities, especially congestive heart failure with EF 20%, he is not a candidate for intravenous chemotherapy.  I discussed the option of immunotherapy for MSI-H disease, and targeted therapy if he has targetable mutations in his tumor.  I also briefly discussed the option of regorafenib and immunotherapy if he has MSI stable disease.  All questions were answered.  I will see him back after his liver biopsy. Will check his lab including tumor marker and CT chest to complete staging.   Truitt Merle  06/23/2019

## 2019-06-26 LAB — CEA (IN HOUSE-CHCC): CEA (CHCC-In House): 98.19 ng/mL — ABNORMAL HIGH (ref 0.00–5.00)

## 2019-06-26 NOTE — Progress Notes (Signed)
Left voice message for patient regarding appointments that have been scheduled.  It looks he was already notified of CT Chest tomorrow 4/6 and liver biopsy for Friday 4/9.  Informed him I have scheduled a follow up appointment with Dr. Burr Medico for Wednesday 4/14 at 3:40 pm.  I asked that he call back if he has any questions regarding any of his upcoming appointments.

## 2019-06-27 ENCOUNTER — Other Ambulatory Visit: Payer: Self-pay

## 2019-06-27 ENCOUNTER — Encounter (HOSPITAL_COMMUNITY): Payer: Self-pay | Admitting: Cardiology

## 2019-06-27 ENCOUNTER — Ambulatory Visit (HOSPITAL_COMMUNITY)
Admission: RE | Admit: 2019-06-27 | Discharge: 2019-06-27 | Disposition: A | Discharge status: Home / Self Care | Payer: Medicare Other | Source: Ambulatory Visit | Attending: Physician Assistant | Admitting: Physician Assistant

## 2019-06-27 ENCOUNTER — Ambulatory Visit (HOSPITAL_COMMUNITY)
Admission: RE | Admit: 2019-06-27 | Discharge: 2019-06-27 | Disposition: A | Discharge status: Home / Self Care | Payer: Medicare Other | Source: Ambulatory Visit | Attending: Cardiology | Admitting: Cardiology

## 2019-06-27 VITALS — BP 110/40 | HR 86 | Wt 143.4 lb

## 2019-06-27 DIAGNOSIS — Z7982 Long term (current) use of aspirin: Secondary | ICD-10-CM | POA: Diagnosis not present

## 2019-06-27 DIAGNOSIS — Z8673 Personal history of transient ischemic attack (TIA), and cerebral infarction without residual deficits: Secondary | ICD-10-CM | POA: Insufficient documentation

## 2019-06-27 DIAGNOSIS — I255 Ischemic cardiomyopathy: Secondary | ICD-10-CM | POA: Diagnosis not present

## 2019-06-27 DIAGNOSIS — E785 Hyperlipidemia, unspecified: Secondary | ICD-10-CM | POA: Diagnosis not present

## 2019-06-27 DIAGNOSIS — C189 Malignant neoplasm of colon, unspecified: Secondary | ICD-10-CM | POA: Insufficient documentation

## 2019-06-27 DIAGNOSIS — I252 Old myocardial infarction: Secondary | ICD-10-CM | POA: Diagnosis not present

## 2019-06-27 DIAGNOSIS — Z951 Presence of aortocoronary bypass graft: Secondary | ICD-10-CM

## 2019-06-27 DIAGNOSIS — I110 Hypertensive heart disease with heart failure: Secondary | ICD-10-CM | POA: Insufficient documentation

## 2019-06-27 DIAGNOSIS — G4733 Obstructive sleep apnea (adult) (pediatric): Secondary | ICD-10-CM | POA: Diagnosis not present

## 2019-06-27 DIAGNOSIS — I080 Rheumatic disorders of both mitral and aortic valves: Secondary | ICD-10-CM | POA: Insufficient documentation

## 2019-06-27 DIAGNOSIS — R918 Other nonspecific abnormal finding of lung field: Secondary | ICD-10-CM | POA: Diagnosis not present

## 2019-06-27 DIAGNOSIS — I5022 Chronic systolic (congestive) heart failure: Secondary | ICD-10-CM | POA: Diagnosis not present

## 2019-06-27 DIAGNOSIS — I2510 Atherosclerotic heart disease of native coronary artery without angina pectoris: Secondary | ICD-10-CM | POA: Diagnosis not present

## 2019-06-27 DIAGNOSIS — Z79899 Other long term (current) drug therapy: Secondary | ICD-10-CM | POA: Diagnosis not present

## 2019-06-27 DIAGNOSIS — C799 Secondary malignant neoplasm of unspecified site: Secondary | ICD-10-CM

## 2019-06-27 DIAGNOSIS — I480 Paroxysmal atrial fibrillation: Secondary | ICD-10-CM | POA: Diagnosis not present

## 2019-06-27 DIAGNOSIS — Z8249 Family history of ischemic heart disease and other diseases of the circulatory system: Secondary | ICD-10-CM | POA: Insufficient documentation

## 2019-06-27 DIAGNOSIS — C787 Secondary malignant neoplasm of liver and intrahepatic bile duct: Secondary | ICD-10-CM | POA: Diagnosis not present

## 2019-06-27 LAB — DIGOXIN LEVEL: Digoxin Level: 0.7 ng/mL — ABNORMAL LOW (ref 0.8–2.0)

## 2019-06-27 MED ORDER — IOHEXOL 300 MG/ML  SOLN
75.0000 mL | Freq: Once | INTRAMUSCULAR | Status: AC | PRN
  Administered 2019-06-27: 10:00:00 75 mL via INTRAVENOUS

## 2019-06-27 MED ORDER — SODIUM CHLORIDE (PF) 0.9 % IJ SOLN
INTRAMUSCULAR | Status: AC
  Filled 2019-06-27: qty 50

## 2019-06-27 NOTE — Patient Instructions (Signed)
No medication changes today!   Labs today We will only contact you if something comes back abnormal or we need to make some changes. Otherwise no news is good news!   Your physician recommends that you schedule a follow-up appointment in: 3 months with Dr Aundra Dubin.   Please call office at [phone] option 2 if you have any questions or concerns.    At the [facility], you and your health needs are our priority. As part of our continuing mission to provide you with exceptional heart care, we have created designated Provider Care Teams. These Care Teams include your primary Cardiologist (physician) and Advanced Practice Providers (APPs- Physician Assistants and Nurse Practitioners) who all work together to provide you with the care you need, when you need it.   You may see any of the following providers on your designated Care Team at your next follow up: Marland Kitchen Dr Glori Bickers . Dr Loralie Champagne . Darrick Grinder, NP . Lyda Jester, PA . Audry Riles, PharmD   Please be sure to bring in all your medications bottles to every appointment.

## 2019-06-28 ENCOUNTER — Telehealth: Payer: Self-pay | Admitting: Physician Assistant

## 2019-06-28 ENCOUNTER — Other Ambulatory Visit: Payer: Self-pay | Admitting: Physician Assistant

## 2019-06-28 ENCOUNTER — Other Ambulatory Visit: Payer: Self-pay

## 2019-06-28 DIAGNOSIS — E041 Nontoxic single thyroid nodule: Secondary | ICD-10-CM

## 2019-06-28 NOTE — Telephone Encounter (Signed)
Called the patient to review his CT scan. Discussed that I would arrange for a US thyroid to further evaluate the incidental thyroid nodule that was noted on CT scan and to be on the look out for a phone call from that department to schedule this.

## 2019-06-28 NOTE — Progress Notes (Signed)
Patient called to get clarification on his liver biopsy for Friday 4/9.  He states he was told to be NPO after midnight but in the computer it says NPO after 7 am. I called IR to clarify and called patient back and confirmed it is NPO after 7 am.  Also he is asking about gaining some of his weight back.  He is supplementing with protein shake only one every other day.  Instructed him to increase to at least 2 to 3 shakes daily to help him maintain some muscle mass and weight.  He verbalized an understanding.  He had a question whether his wife could be back in short stay after procedure.  I called short stay to clarify and there are no visitors allowed.  They will communicate with her how he is doing and when he is ready to be discharged.  I called him back to let him know.

## 2019-06-28 NOTE — Progress Notes (Signed)
Patient ID: Caleb [name], male   DOB: [birth year], 79 y.o.   MRN: [id number]    Advanced Heart Failure Clinic Note   PCP: Caleb. Yong [name] EP: Caleb. Caryl [name] Cardiology: Caleb. Aundra [name]  79 y.o.with complex past history presents for heart failure followup.  Patient had anterior MI in 2004 and developed ischemic cardiomyopathy as well as mitral regurgitation. He also developed atrial fibrillation.  In 10/14, he had MV repair, Maze, CABG with LIMA-LAD, and LA appendage closure at [facility] in [place].  Subsequently, atrial fibrillation returned and he had an atrial fibrillation ablation in [place] in 3/15.  He wore a Zio patch in 12/15 and had a low atrial fibrillation burden of 7%.  He has had a long-standing ischemic cardiomyopathy.  In 2015, EF was 20-25%.  Echo in 2016 also showed EF 25-30% but estimated PA pressure suggested severe pulmonary hypertension.     At initial appointment, he reported increased exertional dyspnea over a number of weeks.  RHC was done, showing mildly elevated PCWP with moderate pulmonary HTN and low cardiac index (1.93 thermo, 2.13 Fick).  V/Q scan showed no PE.  I started him on digoxin and have titrated his Lasix to 80 mg daily.  He is now on bisoprolol and able to tolerate it.  At last appointment, I had him try replacing valsartan with Entresto 24/26 bid.  He was unable to tolerate it (made him dizzy, though BP was not low).  Therefore, I had him restart valsartan.  CPX in 4/16 showed mildly decreased functional capacity.  He has tolerated Adcirca 20 mg daily.  He did not tolerate an attempt to uptitrate bisoprolol.  He tried CPAP but was unable to tolerate it.  He was unable to tolerate eplerenone 50 mg daily.  Last echo in 9/17 showed EF 20-25%, stable MV repair, normal RV, and PA systolic pressure 86 mmHg.   He had a Lexiscan Cardiolite in 9/17 that showed infarction, no ischemia.  [place] in 10/17 showed severe mixed pulmonary venous HTN/pulmonary arterial HTN with PVR 4.3 WU  and PCWP mildly elevated.  Adcirca was increased to 40 mg daily.   At a prior appointment, he was having more palpitations.  Event monitor in 11/17 showed atrial fibrillation episodes, these were symptomatic.  Since then, the atrial fibrillation seems to have decreased.  He saw Caleb. Caryl [name] to discuss Tikosyn initiation.  No decision was reached at that time, and since palpitations have decreased again, he wants to hold off on Tikosyn for now.   He has OSA.  He cannot tolerate CPAP but is using his oral appliance.    HR was lower in 5/18.  He came into the office with HR around 40.  ECG showed an ectopic atrial rhythm with very small P waves. I stopped bisoprolol and later digoxin.  HR has gone up since.  He wore an event monitor in 6/18 showing rare 3-5 second pauses at night only, rare atrial fibrillation, and rare junctional bradycardia.  He wore an event monitor again in 4/19, this showed rare afib (1% total) with nocturnal bradycardia and 1 nocturnal 3 second pause.  No concerning findings.   He stopped Adcirca as his insurance was no longer covering it.    He saw Caleb. Caryl [name], and it was decided that he would be unlikely to improve much with CRT with his current IVCD (not true LBBB).   Echo (7/19) is comparable to the past with mildly dilated LV, EF 25% with wall motion abnormalities, mild  RV dilation/mild decreased function, mild to moderate AI, stable repaired mitral valve, PASP 76 mmHg.     Given worsening symptoms, he had RHC in 8/19.  This showed evidence for volume overload with severe pulmonary arterial hypertension, PVR 5.8 WU and preserved cardiac output. Torsemide was increased.    He was started on Opsumit.  He says that it helped his breathing but he was unable to continue it due to considerable worsening of his peripheral edema.  He has stopped Opsumit.    After developing increased atrial fibrillation burden, I put him back on bisoprolol 2.5 mg daily.  He developed bradycardia and  presyncope, so this was stopped.  Lightheadedness resolved.  Zio patch in 6/20 off bisoprolol showed average HR 68, short runs of SVT (possible atrial fibrillation) and only nocturnal bradycardia.    Echo was done in 7/20 and reviewed, EF 15%, moderate-severe LV dilation, mild RV dilation with decreased systolic function, mild-moderate MR s/p MV repair, mild-moderate AI, PASP 40 mmHg.   Since I last saw Caleb [name], he has unfortunately been diagnosed with metastatic colon cancer.  He has a mass at the hepatic flexure and mets to the liver.  Plan is for a liver biopsy for definitive diagnosis and then a decision about treatment.  Weight is down 1 lb since last appointment.  Breathing is stable, dyspnea only with heavy exertion, but he is feeling much more fatigued over the last few weeks.  No chest pain.  No lightheadedness.   ECG (personally reviewed): Possible ectopic atrial rhythm with PACs and RBBB.   6 minute walk (3/16): 381 m  6 minute walk (5/16): 414.5 m 6 minute walk (10/16): 562 m 6 minute walk (1/17): 469 m 6 minute walk (8/17): 488 m 6 minute walk (6/18): 549 m 6 minute walk (11/18): 366 m 6 minute walk (9/19): 518 m 6 minute walk (9/20): 487 m 6 minute walk (12/20): 426 m  Labs (2/15): LDL 144 Labs (8/15): K 4.6, creatinine 0.9 Labs (12/15): HCT 42.3   Labs (2/16): K 4 => 4.2, creatinine 1.05 => 0.92, BNP 268 Labs (3/16): BNP 495 => 320, digoxin 0.4, RF 14.7 (very mild increase), TSH normal, HIV negative, anti-SCL70 negative, creatinine 0.91, K 4.4 Labs (7/16): K 5, creatinine 1.05, BNP 455, vitamin D normal, digoxin 0.4, B12 normal Labs (8/16): digoxin 0.8, BNP 305, K 4.2, creatinine 0.99 Labs (9/16): HCT 43.3, TSH normal, BNP 492 => 278, K 4.3, creatinine 0.97 => 1.04, digoxin 0.6, TSH normal, LDL 154, LDL-P 1654.  Labs (10/16): K 4.7, creatinine 1.1 Labs (1/17): pro-BNP 2065, digoxin 0.3, K 4.3, creatinine 1.10 => 1.28 Labs (3/17): K 4.1, creatinine 1.09, HCT 38.3 Labs  (4/17): K 4.4, creatinine 0.99, digoxin 0.8 Labs (5/17): K 4.3, creatinine 1.08, BNP 317, hgb 13.1 Labs (8/17): K 4.6, creatinine 0.99, BNP 392, digoxin 0.5 Labs (9/17): K 4.4, creatinine 1.05 Labs (10/17): digoxin 0.5, K 4.1, creatinine 1.12, HCT 44.9, digoxin 0.5 Labs (11/17): K 4 => 3.8, creatinine 1.3 => 1.16, digoxin 0.5, BNP 296 Labs (12/17): K 4.1, creatinine 1.15, BNP 294, digoxin 0.7 Labs (2/18): LDL 135, Lp(a) 124, LDL-P 979  Labs (5/18): K 4.3, creatinine 1.07 => 0.95, BNP 224, hgb 14.4, digoxin 0.6 Labs (6/18): K 4.4, creatinine 1.08, hgb 14 Labs (8/18): K 4.1, creatinine 1.05 Labs (12/18): K 3.7, creatinine 0.94 Labs (2/19): K 4.1, creatinine 0.99, BNP 255 Labs (5/19): K 4, creatinine 0.98, LDL 140 Labs (7/19): LDL 136 Labs (9/19): K 4.1, creatinine 0.91 Labs (12/19):  K 3.6, creatinine 1.18, BNP 479, hgb 12.9 Labs (3/20): K 3.4, creatinine 1.14 Labs (5/20): K 4.1, creatinine 0.96 Labs (7/20): K 4, creatinine 1.05, BNP 407  Labs (10/20): BNP 489, digoxin < 0.2 Labs (11/20): K 3.5, creatinine 1.0 Labs (12/20): K 3.1, creatinine 1.26 => 1.07, digoxin < 0.2 => 0.5 Labs (4/21): K 3.9, creatinine 1.18, hgb 12.9  PMH: 1. CAD: Anterior MI in 2004.  Cardiac surgery in 10/14 included LIMA-LAD.  - Lexiscan Cardiolite (9/17): EF 35%, infarct present with no ischemia.  2. Chronic mitral regurgitation: 10/14 surgery at [facility] with MV repair, Maze, LIMA-LAD, and LA appendage closure.  3. Atrial fibrillation: Paroxysmal.  He was initially on Tikosyn but had breakthrough atrial fibrillation.  H/o Maze in 2014.  Had recurrent atrial fibrillation with ablation in 3/15 by Caleb [name] in [facility].  Not anticoagulated after 2 severe prostate bleeding episodes.  LA appendage was oversewn with MV surgery.  - Zio patch in 12/15 with low atrial fibrillation burden (7%).  - Holter (9/16) with PACs, PVCs, short atrial fibrillation runs (nothing sustained). - Event monitor (11/17) with runs  of atrial fibrillation and flutter.   - Event monitor (6/18) with rare atrial fibrillation - Event monitor 4/19 showing rare afib (1% total) with nocturnal bradycardia and 1 nocturnal 3 second pause.  No concerning findings.  - Zio patch (6/20): Primarily NSR, average HR 68, short SVT runs (possible atrial fibrillation), occasional nocturnal bradycardia (nothing worrisome).  4. Chronotropic incompetence.  5. HTN 6. Hyperlipidemia: Refuses statin.  7. Peripheral neuropathy 8. GERD 9. H/o BPPV 10. H/o TIA 11. Ischemic cardiomyopathy: cardiac MRI 7/14 with EF 35%, moderate MR, normal RV size and systolic function, extensive anterior and anteroseptal LGE suggestive of non-viable myocardium (this was prior to LIMA-LAD).  Echo 1/15 with EF 20-25%, moderate AI, PA systolic pressure 39 mmHg. Echo (1/16) with EF 20-25%, diffuse hypokinesis with regionality, moderate LV dilation, moderate AI, s/p MV repair with mild MR and normal gradients, RV dilated with mildly decreased systolic function, PA systolic pressure 71 mmHg.   - RHC (2/16) with mean RA 9, PA 61/25 mean 40, mean PCWP 23, CI 2.13/PVR 4.3 (Fick), CI 1.93/PVR 4.7 (thermo).   - CPX (4/16) with peak VO2 17.9, VE/VCO2 34.7 => mildly decreased functional capacity.  - Spironolactone apparently caused cognitive deficits - Intolerant of Coreg due to development of severe alopecia - Unable to uptitrate bisoprolol due to intolerance.  - Lightheaded with Entresto.  - Unable to tolerate increase in valsartan to 80 mg bid.  - Echo (1/17) with EF 30-35%, moderate LV dilation, mild AI, s/p MV repair with mild MR, moderately dilated RV with mildly decreased systolic function, PA systolic pressure 69 mmHg.  - CPX (4/17): peak VO2 18, VE/VCO2 slope 33, RER 1.28 => mild to moderate functional impairment, mildly improved.  - Echo (9/17): EF 20-25% with regional WMAs, normal RV size and systolic function, PASP 86 mmHg, stable repaired mitral valve with mild MR,  moderate AI.  - RHC (10/17): mean RA 9, PA 70/23 mean 43, PCWP mean 20, CI 2.96 Fick/2.84 Thermo, PVR 4.3 WU.  - Echo (9/18): severe LV dilation, EF 20-25% with WMAs, s/p MV repair with mild MR, moderate AI, moderate TR, PASP 68 mmHg, normal RV size and systolic function.  - Echo (7/19): mildly dilated LV, EF 25% with wall motion abnormalities, mild RV dilation/mild decreased function, mild to moderate AI, stable repaired mitral valve (no MS, mild MR), PASP 76 mmHg.   -  CPX (8/19): peak VO2 18.9, VE/VCO2 slope 40, RER 1.09 => moderate HF limitation.  - RHC (8/19): mean RA 14, PA 75/28 mean 47, mean PCWP 24, CI 2.44/PVR 5 Fick, CI 2.13/PVR 5.8 thermo.  - Echo (7/20): EF 15%, moderate-severe LV dilation, mild RV dilation with decreased systolic function, mild-moderate MR s/p MV repair, mild-moderate AI, PASP 40 mmHg.  - CPX (1/21): Peak VO2 20, VE/VCO2 slope 46, RER 1.05. Moderate HF limitation, similar to prior.  12. Carotid stenosis: Carotid dopplers (1/16) with 40-59% bilateral ICA stenosis. Carotid dopplers (4/17) with 40-59% BICA stenosis.  - Carotid dopplers (2/18) with < 50% BICA stenosis.  - Carotid dopplers (10/20): Mild stenosis.  13. Aortic insufficiency: Mild-moderate by last echo.  14. Pulmonary HTN: Mixed PAH and pulmonary venous hypertension.  PFTs (9/14) were normal.  V/Q scan (2/16) with no evidence of acute or chronic PE.   - Unable to tolerate Opsumit due to extensive peripheral edema.  15. OSA: Moderate on 5/16 sleep study. Unable to tolerate CPAP.  Repeat sleep study at [facility] in 2/18 was also suggestive of OSA.  16. Venous insufficiency 17. Ventral hernia 18. Bradycardia: Holter (5/18) with overnight pauses up to 4.7 sec (likely due to OSA), occasional short atrial tachycardia runs, no atrial fibrillation, avg HR 70s, 3% PVCs.  - Event monitor (6/18): Rare 3-4 second pauses at night, rare atrial fibrillation, rare runs of junctional bradycardia.  85. Colon cancer: Diagnosed  2021, metastatic to liver.   SH: Married, lives in [place], Engineer, water, nonsmoker  FH: CAD  ROS: All systems reviewed and negative except as per HPI.   Current Outpatient Medications  Medication Sig Dispense Refill  . ascorbic acid (VITAMIN C) 1000 MG tablet Take by mouth.    Marland Kitchen aspirin EC 81 MG tablet Take 1 tablet (81 mg total) by mouth daily. 30 tablet 3  . digoxin (LANOXIN) 0.125 MG tablet Take 1 tablet (0.125 mg total) by mouth daily. 90 tablet 3  . eplerenone (INSPRA) 25 MG tablet TAKE 1 TABLET BY MOUTH ONCE DAILY 30 tablet 11  . magnesium gluconate (MAGONATE) 500 MG tablet Take 500 mg by mouth 2 (two) times daily.     . metolazone (ZAROXOLYN) 2.5 MG tablet Take 2.5 mg by mouth as needed.     . Omega-3 Fatty Acids (FISH OIL PO) Take 360 mg by mouth daily.     . potassium chloride (K-DUR,KLOR-CON) 20 MEQ tablet Take 2.5 tablets (50 mEq total) by mouth daily. 225 tablet 3  . sacubitril-valsartan (ENTRESTO) 24-26 MG Take 1 tablet by mouth 2 (two) times daily. 60 tablet 5  . torsemide (DEMADEX) 20 MG tablet Take 3 tablets (60 mg total) by mouth every morning AND 1 tablet (20 mg total) every evening. 540 tablet 2   No current facility-administered medications for this encounter.   BP (!) 110/40   Pulse 86   Wt 65 kg (143 lb 6.4 oz)   SpO2 97%   BMI 20.58 kg/m    Wt Readings from Last 3 Encounters:  06/27/19 65 kg (143 lb 6.4 oz)  06/23/19 64.6 kg (142 lb 8 oz)  06/21/19 64.4 kg (142 lb)    General: NAD Neck: No JVD, no thyromegaly or thyroid nodule.  Lungs: Clear to auscultation bilaterally with normal respiratory effort. CV: Nondisplaced PMI.  Heart regular S1/S2, no S3/S4, 1/6 SEM RUSB.  Trace ankle edema.  No carotid bruit.  Normal pedal pulses.  Abdomen: Soft, nontender, no hepatosplenomegaly, no distention.  Skin: Intact without  lesions or rashes.  Neurologic: Alert and oriented x 3.  Psych: Normal affect. Extremities: No clubbing or cyanosis.  HEENT: Normal.    Assessment/Plan: 1. CAD: S/p LIMA-LAD.  He has decided not to take statins after reviewing the data (I did recommend taking a statin but we have agreed to disagree on this, he is taking red yeast rice extract).  Lexiscan Cardiolite was done in 9/17 due to atypical chest pain.  This showed prior infarction with no ischemia.  No recurrence of chest pain since that time despite ongoing exercise.   - Continue ASA 81 daily.  2. S/p mitral valve repair: The MV repair looked stable on 7/20 echo with no MS, mild-moderate MR.  3. Aortic insufficiency: Echo in 9/20 with mild-moderate AI, stable.  4. Chronic systolic CHF: Ischemic cardiomyopathy, EF 15% with mildly decreased RV systolic function on 9/37 echo.  Most recent CPX in 1/21 with moderate HF limitation, no change from prior.  He was volume overloaded on 8/19 RHC with severe pulmonary hypertension but preserved cardiac output.  NYHA class II dyspnea but he has noted significant fatigue.  This may be due to metastatic cancer.  He is not volume overloaded on exam.  - He is taking torsemide 60 qam/20 qpm, can continue this dose.  BMET today.   - Off bisoprolol with bradycardia.  - Continue digoxin 0.125 daily and check level today.   - Continue Entresto 24/26 bid.  I will not increase with soft BP.  - Continue eplerenone 25 (unable to tolerate increase).    - We have discussed dapagliflozin in the past, he wants to hold off on starting new meds for the time being while treatment decisions are made about his cancer.       - He would be unlikely to benefit much from CRT, have reviewed with Caleb. Caryl [name] (had IVCD not LBBB in the past, now today has RBBB).   5. Atrial fibrillation and atypical atrial flutter: s/p Maze in 10/14, then ablation in 3/15.  Prior to Maze, he was on Tikosyn but had breakthrough.  Currently, he is not anticoagulated due to history of prostate bleeding and his choice.  He did have his LA appendage oversewn at time of MV surgery.   1%  atrial fibrillation on 4/19 event monitor.  Zio patch in 6/20 with short SVT runs (possible atrial fibrillation).  I think that he is in NSR vs ectopic atrial rhythm today with low voltage P waves.  - He is off bisoprolol with bradycardia.    - We have had discussions about what to do with his atrial fibrillation/flutter.  He is symptomatic at times when in atrial fibrillation/fluttter. He had breakthrough on Tikosyn in the past, but has had Maze and atrial fibrillation ablation since that time. He opted to hold off on trying Tikosyn again and currently feels like the atrial fibrillation/flutter burden is manageable.   6. Pulmonary hypertension: Severe by echo in 9/17 and on RHC in 10/17.  RV actually looked ok on 9/17 echo.  Mixed pulmonary venous and pulmonary arterial HTN on RHC.  It is possible that the [facility] component is due to pulmonary vascular remodeling in the setting of chronic mitral regurgitation prior to MV repair. Negative V/Q scan, no evidence for CTEPH.  PFTs normal in 9/14.  He is seeing Caleb [name].  Sleep study showed moderate OSA but he has been unable to tolerate CPAP and is now using an oral device. PASP 76 mmHg by echo in 7/19.  He tried Engineer, site but stopped it so that he could take a nitric oxide supplement.  I repeated RHC in 8/19.  This showed severe mixed pulmonary venous/pulmonary arterial HTN with PVR 5.8 WU and preserved cardiac output. He was started on Opsumit but did not tolerate due to extensive peripheral edema.  - Breathing improved with Opsumit, but he was unable to tolerate due to severe peripheral edema.     - He stopped tadalafil again as he did not feel like it helped (would only take 10 mg daily).  - We have discussed a trial of selexipag.  He will consider it in the future.   7. OSA: Moderate.  Has not tolerated CPAP. Probably plays a role in recurrent atrial fibrillation and pulmonary hypertension as well as nocturnal sinus pauses.  He has been using his oral  device. 8. Carotid stenosis: Mild on 10/20 dopplers.   9. Bradycardia: Nocturnal bradycardia in the past likely related to OSA.  Developed more ongoing bradycardia and bisoprolol and digoxin stopped.  Restarted later on bisoprolol with afib/mild RVR but became bradycardic with pre-syncope and bisoprolol stopped. I think that he has sick sinus syndrome.  He could eventually need a PPM, but currently, his HR is adequate off bisoprolol, and he has tolerated restarting digoxin.   - He will stay off beta blockers.  - Follow digoxin level.  10. Metastatic colon cancer: He is going to have a liver biopsy for definitive diagnosis and treatment decisions will then be made.  He is seeing Caleb. Burr [name].   Followup in 3 months.   Loralie Champagne 06/28/2019

## 2019-06-29 ENCOUNTER — Other Ambulatory Visit: Payer: Self-pay | Admitting: Radiology

## 2019-06-29 NOTE — Progress Notes (Signed)
[place]   [phone] [phone]   Clinic Follow up Note   Patient Care Team: Hoyt Koch, MD as PCP - General (Internal Medicine) Elsie Stain, MD (Pulmonary Disease) Deboraha Sprang, MD as Consulting Physician (Cardiology) Jonnie Finner, RN as Oncology Nurse Navigator  Date of Service:  07/05/2019  CHIEF COMPLAINT: Metastatic colorectal adenocarcinoma.  SUMMARY OF ONCOLOGIC HISTORY: Oncology History Overview Note  Suspicious stage IV colorectal cancer   Metastatic carcinoma to liver ([place])  06/08/2019 Imaging   US Abdomen  IMPRESSION: 1. Mild cholelithiasis with largest stone measuring 6 mm. Equivocal wall thickening. Recommend clinical correlation for acute cholecystitis.   2. Coarse heterogeneous echogenicity of the liver with suggestion of multiple masses with the largest over the right lobe measuring 4.8 cm. Recommend CT with contrast for further evaluation.   3.  1.6 cm left renal cyst.   06/19/2019 Imaging   CT AP W Contrast  IMPRESSION: 1. Findings of colon cancer at the hepatic flexure with mesenteric adenopathy and extensive hepatic metastatic disease. 2. Chronic findings are noted above.   06/23/2019 Initial Diagnosis   Metastatic carcinoma to liver ([place]). Suspicious stage IV colorectal cancer.    06/23/2019 Tumor Marker   CEA 98.19    06/27/2019 Imaging   CT Chest W contrast IMPRESSION: 1. Small nonspecific pulmonary nodules are identified measuring up to 4 mm. Small pulmonary metastasis cannot be excluded. 2. Liver metastases. Please see report from diagnostic CT of the abdomen pelvis dated 06/19/2019 for more details. 3. Ascending thoracic aortic aneurysm measuring 4.1 cm. Recommend annual imaging followup by CTA or MRA. This recommendation follows 2010 ACCF/AHA/AATS/ACR/ASA/SCA/SCAI/SIR/STS/SVM Guidelines for the Diagnosis and Management of Patients with Thoracic Aortic Disease. Circulation.  2010; 121: J188-C166. Aortic aneurysm NOS (ICD10-I71.9) 4. 1.5 cm left lobe of thyroid nodule. Recommend thyroid US (ref: J Am Coll Radiol. 2015 Feb;12(2): 143-50).   Aortic Atherosclerosis (ICD10-I70.0).   06/30/2019 Initial Biopsy   FINAL MICROSCOPIC DIAGNOSIS:   A. LIVER, BIOPSY:  - Adenocarcinoma consistent with metastatic colorectal adenocarcinoma.  - See comment.   COMMENT:  There is adenocarcinoma with necrosis and the morphologic features are  consistent with metastatic colorectal adenocarcinoma.  There is  sufficient tissue for additional testing.    Colorectal cancer, stage IV ([place])  07/03/2019 Initial Diagnosis   Colorectal cancer, stage IV ([place])   07/17/2019 -  Chemotherapy   The patient had bevacizumab-bvzr (ZIRABEV) 500 mg in sodium chloride 0.9 % 100 mL chemo infusion, 7.5 mg/kg, Intravenous,  Once, 0 of 4 cycles  for chemotherapy treatment.       CURRENT THERAPY:  PENDING Xeloda and Avastin   INTERVAL HISTORY:  [name] is here for a follow up. He presents to the clinic with his wife. He notes he feels fatigued but denies pain. He notes he is still able to function well at home. He notes he has reached out to other cancer clinics for Clinical trails and second opinions. He notes he is taking Neo40 which is beet root extract and Aericell. He plans to start taking Protein supplement. He also notes being on Mushroom supplements and thinking of AMPK. He notes he would like second opinion to Dr. Jilda Roche and Dr. Donnal Debar. He is fine with his sons knowing about his cancer diagnosis.    REVIEW OF SYSTEMS:   Constitutional: Denies fevers, chills or abnormal weight loss (+) increased fatigue  Eyes: Denies blurriness of vision Ears, nose, mouth, throat, and face: Denies  mucositis or sore throat Respiratory: Denies cough, dyspnea or wheezes Cardiovascular: Denies palpitation, chest discomfort or lower extremity swelling Gastrointestinal:  Denies nausea, heartburn  or change in bowel habits Skin: Denies abnormal skin rashes Lymphatics: Denies new lymphadenopathy or easy bruising Neurological:Denies numbness, tingling or new weaknesses Behavioral/Psych: Mood is stable, no new changes  All other systems were reviewed with the patient and are negative.  MEDICAL HISTORY:  Past Medical History:  Diagnosis Date  . Allergic rhinitis   . Atrial fibrillation ([place])   . Atypical pneumonia   . CAD (coronary artery disease)   . Cardiomyopathy, ischemic   . Chronic anticoagulation   . Cough   . Dizziness   . GERD (gastroesophageal reflux disease)   . Heart failure, systolic, acute on chronic ([place])   . Hyperlipidemia   . Hypertension   . OSA (obstructive sleep apnea)    Home sleep test 07/05/2009 AHI 8.2  . Pleural effusion   . Positional vertigo   . TIA (transient ischemic attack)     SURGICAL HISTORY: Past Surgical History:  Procedure Laterality Date  . AORTIC VALVE REPAIR  01/09/13  . CARDIAC CATHETERIZATION N/A 01/10/2016   Procedure: Right Heart Cath;  Surgeon: Larey Dresser, MD;  Location: [facility];  Service: Cardiovascular;  Laterality: N/A;  . CORONARY ARTERY BYPASS GRAFT  01/09/13   LAD LIMA, left atrial appendage  . CORONARY STENT PLACEMENT  2004   LAD  . MITRAL VALVE ANNULOPLASTY  01/09/13  . RIGHT HEART CATH N/A 11/16/2017   Procedure: RIGHT HEART CATH;  Surgeon: Larey Dresser, MD;  Location: [facility];  Service: Cardiovascular;  Laterality: N/A;  . RIGHT HEART CATHETERIZATION N/A 05/11/2014   Procedure: RIGHT HEART CATH;  Surgeon: Larey Dresser, MD;  Location: [facility] CATH LAB;  Service: Cardiovascular;  Laterality: N/A;  . SHOULDER SURGERY    . TEE WITHOUT CARDIOVERSION N/A 09/21/2012   Procedure: TRANSESOPHAGEAL ECHOCARDIOGRAM (TEE);  Surgeon: Larey Dresser, MD;  Location: [facility] ENDOSCOPY;  Service: Cardiovascular;  Laterality: N/A;    I have reviewed the social history and family history with the patient and  they are unchanged from previous note.  ALLERGIES:  is allergic to carvedilol; codeine; opsumit [macitentan]; dabigatran; pradaxa [dabigatran etexilate mesylate]; sulfonamide derivatives; and xarelto [rivaroxaban].  MEDICATIONS:  Current Outpatient Medications  Medication Sig Dispense Refill  . ascorbic acid (VITAMIN C) 1000 MG tablet Take by mouth.    Marland Kitchen aspirin EC 81 MG tablet Take 1 tablet (81 mg total) by mouth daily. 30 tablet 3  . capecitabine (XELODA) 500 MG tablet Take 3 tablets (1,500 mg total) by mouth 2 (two) times daily after a meal. Take 7 days on and 7 days off 48 tablet 0  . digoxin (LANOXIN) 0.125 MG tablet Take 1 tablet (0.125 mg total) by mouth daily. 90 tablet 3  . eplerenone (INSPRA) 25 MG tablet TAKE 1 TABLET BY MOUTH ONCE DAILY 30 tablet 11  . magnesium gluconate (MAGONATE) 500 MG tablet Take 500 mg by mouth 2 (two) times daily.     . metolazone (ZAROXOLYN) 2.5 MG tablet Take 2.5 mg by mouth as needed.     . Omega-3 Fatty Acids (FISH OIL PO) Take 360 mg by mouth daily.     . potassium chloride (K-DUR,KLOR-CON) 20 MEQ tablet Take 2.5 tablets (50 mEq total) by mouth daily. 225 tablet 3  . sacubitril-valsartan (ENTRESTO) 24-26 MG Take 1 tablet by mouth 2 (two) times daily. 60 tablet 5  .  torsemide (DEMADEX) 20 MG tablet Take 3 tablets (60 mg total) by mouth every morning AND 1 tablet (20 mg total) every evening. 540 tablet 2   No current facility-administered medications for this visit.    PHYSICAL EXAMINATION: ECOG PERFORMANCE STATUS: 2 - Symptomatic, <50% confined to bed  Vitals:   07/05/19 1541  BP: (!) 143/68  Pulse: 76  Resp: 18  Temp: 98.6 F (37 C)  SpO2: 100%   Filed Weights   07/05/19 1541  Weight: 144 lb 3.2 oz (65.4 kg)    Due to COVID19 we will limit examination to appearance. Patient had no complaints.  GENERAL:alert, no distress and comfortable SKIN: skin color normal, no rashes or significant lesions EYES: normal, Conjunctiva are pink and  non-injected, sclera clear  NEURO: alert & oriented x 3 with fluent speech   LABORATORY DATA:  I have reviewed the data as listed CBC Latest Ref Rng & Units 06/30/2019 06/23/2019 05/25/2019  WBC 4.0 - 10.5 K/uL 7.2 7.2 6.5  Hemoglobin 13.0 - 17.0 g/dL 12.8(L) 12.9(L) 12.6(L)  Hematocrit 39.0 - 52.0 % 39.7 40.2 37.3(L)  Platelets 150 - 400 K/uL 261 265 244.0     CMP Latest Ref Rng & Units 06/30/2019 06/23/2019 05/25/2019  Glucose 70 - 99 mg/dL 100(H) 106(H) 103(H)  BUN 8 - 23 mg/dL 26(H) 20 22  Creatinine 0.61 - 1.24 mg/dL 1.05 1.18 1.10  Sodium 135 - 145 mmol/L 135 139 134(L)  Potassium 3.5 - 5.1 mmol/L 4.3 3.9 4.1  Chloride 98 - 111 mmol/L 94(L) 96(L) 96  CO2 22 - 32 mmol/L _0 Calcium 8.9 - 10.3 mg/dL 9.1 9.4 9.9  Total Protein 6.5 - 8.1 g/dL 8.2(H) 8.1 7.7  Total Bilirubin 0.3 - 1.2 mg/dL 1.2 1.0 0.8  Alkaline Phos 38 - 126 U/L 119 124 100  AST 15 - 41 U/L 69(H) 56(H) 36  ALT 0 - 44 U/L _1 RADIOGRAPHIC STUDIES: I have personally reviewed the radiological images as listed and agreed with the findings in the report. No results found.   ASSESSMENT & PLAN:  Caleb [name] is a 79 y.o. male with    1. Stage IV colorectal cancer of the right hepatic flexure with metastatic adenopathy and extensive hepatic metastatic disease. -Initial CT scan on 06/19/2019 showed high suspicion for colorectal cancer with a right hepatic flexure mass with metastatic adenopathy in extensive hepatic metastatic disease.  -Patient at high risk for colonoscopy due to congestive heart failure (ejection fraction 10 to 15% 09/2018).  -We discussed his liver biopsy from 06/30/19 which shows Adenocarcinoma consistent with metastatic colorectal adenocarcinoma. -His CT Chest from 06/27/19 shows small non specific lung nodules, so likely not related to colon cancer.  -I discussed at this stage his cancer is not curable but still treatable. Standard treatment would include chemotherapy but due to his  advanced age and significant comorbidities, especially congestive heart failure with EF 20%, he is not a candidate for intravenous chemotherapy. I discussed option of less intensive oral chemo with Xeloda 99m/m2 BID 1 week on/1 week off with possible IV biological agent Avastin q2-3 weeks. I discussed systemic treatment would be continued for as long as he tolerated and it is controlling his disease. I gave print out medication of Medications.   --Chemotherapy consent: Side effects including but does not limited to, fatigue, nausea, vomiting, diarrhea, mild hair loss, neuropathy, fluid retention, skin toxicity, renal and kidney dysfunction, neutropenic fever, needed for blood  transfusion, bleeding, were discussed with patient in great detail. He is interested, but would like to wait until FO result returns  -We again discussed the goal of therapy is palliative, to prolong his life and improve his quality of life. -I did request MMR and Foundation One testing on biopsy sample to determine if he is eligible for target or immunotherapy such as Keytruda.  Given his right side colon cancer, even if his K-ras and NRAS mutation were negative, I would not recommend EGFR inhibitor as first-line treatment. -His baseline CEA at 98.19 on 06/23/19. I would monitor response to treatment with CEA every 2 weeks and CT scan every 3-4 months.  -I discussed treating him sooner than later is better given his interim disease progression can decrease his performance status and he would no longer be candidate for treatment. He understands.  -He is currently looking for clinical trails and second opinions. I dicussed given heart failure he may not be eligible for clinical trail but I encouraged him to proceed with second opinions soon.  -I encouraged him to eat high protein and calorie diet with less simple sugars. He is fine to proceed with supplements for now, but may have to check interactions if he is on chemo. He understands. I  suggest Vit D and tumeric is fine.  -F/u open    2. Constipation -I encouraged the patient to continue to use stool softeners and laxatives. The goal is to achieve a bowel movement daily.  -I previously discussed that some of his abdominal pain may be attributed to gas pain.   3. Afib, CAD, CHF with EF20%, HTN, S/p stroke -He is concerned about chemo treatment on his heart function. Will monitor heart function and BP on oral chemo and avastin if he chooses to proceed.  -He will continue to f/u with her cardiologist. I will discuss avastin use with his cardiologist.  -He is on [place] and NEO40 which is a beet root extract that acts ad a vasodilator.  -He has small lung nodules as seen on 06/27/19 CT chest, will monitor on future scans.   4. Goal of care discussion  -We again discussed the incurable nature of his cancer, and the overall poor prognosis, especially if he does not have good response to chemotherapy or progress on chemo -The patient understands the goal of care is palliative. -I recommend DNR/DNI, he will think about it     PLAN: -Liver biopsy reviewed, shows metastatic colon cancer  -I will call in Xeloda today to [facility], will not fill until his FO result returns. Will also get Avastin approved by his insurance   -Send records to Dr. Patsy Lager and Med On[facility] at [facility] for second opinion -F/u when his FO result returns    No problem-specific Assessment & Plan notes found for this encounter.   No orders of the defined types were placed in this encounter.  All questions were answered. The patient knows to call the clinic with any problems, questions or concerns. No barriers to learning was detected. The total time spent in the appointment was 45 minutes.     Truitt Merle, MD 07/05/2019   I, Joslyn Devon, am acting as scribe for Truitt Merle, MD.   I have reviewed the above documentation for accuracy and completeness, and I agree with the above.

## 2019-06-30 ENCOUNTER — Other Ambulatory Visit: Payer: Self-pay

## 2019-06-30 ENCOUNTER — Encounter (HOSPITAL_COMMUNITY): Payer: Self-pay

## 2019-06-30 ENCOUNTER — Ambulatory Visit (HOSPITAL_COMMUNITY)
Admission: RE | Admit: 2019-06-30 | Discharge: 2019-06-30 | Disposition: A | Discharge status: Home / Self Care | Payer: Medicare Other | Source: Ambulatory Visit | Attending: Physician Assistant | Admitting: Physician Assistant

## 2019-06-30 DIAGNOSIS — C189 Malignant neoplasm of colon, unspecified: Secondary | ICD-10-CM | POA: Insufficient documentation

## 2019-06-30 DIAGNOSIS — C799 Secondary malignant neoplasm of unspecified site: Secondary | ICD-10-CM | POA: Diagnosis present

## 2019-06-30 DIAGNOSIS — C787 Secondary malignant neoplasm of liver and intrahepatic bile duct: Secondary | ICD-10-CM | POA: Insufficient documentation

## 2019-06-30 DIAGNOSIS — C229 Malignant neoplasm of liver, not specified as primary or secondary: Secondary | ICD-10-CM | POA: Diagnosis not present

## 2019-06-30 DIAGNOSIS — K769 Liver disease, unspecified: Secondary | ICD-10-CM | POA: Diagnosis not present

## 2019-06-30 LAB — COMPREHENSIVE METABOLIC PANEL
ALT: 27 U/L (ref 0–44)
AST: 69 U/L — ABNORMAL HIGH (ref 15–41)
Albumin: 4.1 g/dL (ref 3.5–5.0)
Alkaline Phosphatase: 119 U/L (ref 38–126)
Anion gap: 13 (ref 5–15)
BUN: 26 mg/dL — ABNORMAL HIGH (ref 8–23)
CO2: 28 mmol/L (ref 22–32)
Calcium: 9.1 mg/dL (ref 8.9–10.3)
Chloride: 94 mmol/L — ABNORMAL LOW (ref 98–111)
Creatinine, Ser: 1.05 mg/dL (ref 0.61–1.24)
GFR calc Af Amer: >60 mL/min (ref >60)
GFR calc non Af Amer: >60 mL/min (ref >60)
Glucose, Bld: 100 mg/dL — ABNORMAL HIGH (ref 70–99)
Potassium: 4.3 mmol/L (ref 3.5–5.1)
Sodium: 135 mmol/L (ref 135–145)
Total Bilirubin: 1.2 mg/dL (ref 0.3–1.2)
Total Protein: 8.2 g/dL — ABNORMAL HIGH (ref 6.5–8.1)

## 2019-06-30 LAB — CBC WITH DIFFERENTIAL/PLATELET
Abs Immature Granulocytes: 0.04 10*3/uL (ref 0.00–0.07)
Basophils Absolute: 0 10*3/uL (ref 0.0–0.1)
Basophils Relative: 1 %
Eosinophils Absolute: 0.2 10*3/uL (ref 0.0–0.5)
Eosinophils Relative: 2 %
HCT: 39.7 % (ref 39.0–52.0)
Hemoglobin: 12.8 g/dL — ABNORMAL LOW (ref 13.0–17.0)
Immature Granulocytes: 1 %
Lymphocytes Relative: 20 %
Lymphs Abs: 1.4 10*3/uL (ref 0.7–4.0)
MCH: 30.5 pg (ref 26.0–34.0)
MCHC: 32.2 g/dL (ref 30.0–36.0)
MCV: 94.5 fL (ref 80.0–100.0)
Monocytes Absolute: 0.9 10*3/uL (ref 0.1–1.0)
Monocytes Relative: 12 %
Neutro Abs: 4.7 10*3/uL (ref 1.7–7.7)
Neutrophils Relative %: 64 %
Platelets: 261 10*3/uL (ref 150–400)
RBC: 4.2 MIL/uL — ABNORMAL LOW (ref 4.22–5.81)
RDW: 14 % (ref 11.5–15.5)
WBC: 7.2 10*3/uL (ref 4.0–10.5)
nRBC: 0 % (ref 0.0–0.2)

## 2019-06-30 LAB — PROTIME-INR
INR: 1 (ref 0.8–1.2)
Prothrombin Time: 12.7 seconds (ref 11.4–15.2)

## 2019-06-30 MED ORDER — FENTANYL CITRATE (PF) 100 MCG/2ML IJ SOLN
INTRAMUSCULAR | Status: AC
  Filled 2019-06-30: qty 2

## 2019-06-30 MED ORDER — MIDAZOLAM HCL 2 MG/2ML IJ SOLN
INTRAMUSCULAR | Status: AC
  Filled 2019-06-30: qty 2

## 2019-06-30 MED ORDER — FENTANYL CITRATE (PF) 100 MCG/2ML IJ SOLN
INTRAMUSCULAR | Status: AC | PRN
  Administered 2019-06-30: 50 ug via INTRAVENOUS

## 2019-06-30 MED ORDER — SODIUM CHLORIDE 0.9 % IV SOLN: INTRAVENOUS | Status: DC

## 2019-06-30 MED ORDER — MIDAZOLAM HCL 2 MG/2ML IJ SOLN
INTRAMUSCULAR | Status: AC | PRN
  Administered 2019-06-30: 0.5 mg via INTRAVENOUS
  Administered 2019-06-30: 1 mg via INTRAVENOUS

## 2019-06-30 MED ORDER — GELATIN ABSORBABLE 12-7 MM EX MISC
CUTANEOUS | Status: AC
  Filled 2019-06-30: qty 1

## 2019-06-30 MED ORDER — LIDOCAINE HCL 1 % IJ SOLN
INTRAMUSCULAR | Status: AC
  Filled 2019-06-30: qty 20

## 2019-06-30 NOTE — Consult Note (Signed)
Chief Complaint: Patient was seen in consultation today for image guided liver lesion biopsy  Referring Physician(s): Heilingoetter,Cassandra L/Feng,Y  Supervising Physician: Jacqulynn Cadet  Patient Status: [facility] - Out-pt  History of Present Illness: Caleb [name] is an 79 y.o. male with multiple medical problems including paroxysmal atrial fibrillation, coronary artery disease/ischemic cardiomyopathy, GERD, CHF with ejection fraction of 20%, hyperlipidemia, hypertension, obstructive sleep apnea, prior TIA.  Secondary to abdominal pain/distention patient underwent recent imaging which revealed findings of colon cancer at the hepatic flexure with mesenteric adenopathy and extensive hepatic metastatic disease, small pulmonary nodules, ascending thoracic aortic aneurysm measuring 4.1 cm, 1.5 cm left thyroid nodule.  Due to significant cardiomyopathy GI/oncology services felt that liver biopsy was safer approach than colonoscopy for tissue diagnosis.  He presents today for image guided liver lesion biopsy for further evaluation.  Past Medical History:  Diagnosis Date  . Allergic rhinitis   . Atrial fibrillation ([place])   . Atypical pneumonia   . CAD (coronary artery disease)   . Cardiomyopathy, ischemic   . Chronic anticoagulation   . Cough   . Dizziness   . GERD (gastroesophageal reflux disease)   . Heart failure, systolic, acute on chronic ([place])   . Hyperlipidemia   . Hypertension   . OSA (obstructive sleep apnea)    Home sleep test 07/05/2009 AHI 8.2  . Pleural effusion   . Positional vertigo   . TIA (transient ischemic attack)     Past Surgical History:  Procedure Laterality Date  . AORTIC VALVE REPAIR  01/09/13  . CARDIAC CATHETERIZATION N/A 01/10/2016   Procedure: Right Heart Cath;  Surgeon: Larey Dresser, MD;  Location: [place] [facility];  Service: Cardiovascular;  Laterality: N/A;  . CORONARY ARTERY BYPASS GRAFT  01/09/13   LAD LIMA, left atrial appendage  .  CORONARY STENT PLACEMENT  2004   LAD  . MITRAL VALVE ANNULOPLASTY  01/09/13  . RIGHT HEART CATH N/A 11/16/2017   Procedure: RIGHT HEART CATH;  Surgeon: Larey Dresser, MD;  Location: [facility];  Service: Cardiovascular;  Laterality: N/A;  . RIGHT HEART CATHETERIZATION N/A 05/11/2014   Procedure: RIGHT HEART CATH;  Surgeon: Larey Dresser, MD;  Location: [facility] C[facility] LAB;  Service: Cardiovascular;  Laterality: N/A;  . SHOULDER SURGERY    . TEE WITHOUT CARDIOVERSION N/A 09/21/2012   Procedure: TRANSESOPHAGEAL ECHOCARDIOGRAM (TEE);  Surgeon: Larey Dresser, MD;  Location: [facility] ENDOSCOPY;  Service: Cardiovascular;  Laterality: N/A;    Allergies: Carvedilol, Codeine, Opsumit [macitentan], Dabigatran, Pradaxa [dabigatran etexilate mesylate], Sulfonamide derivatives, and Xarelto [rivaroxaban]  Medications: Prior to Admission medications   Medication Sig Start Date End Date Taking? Authorizing Provider  ascorbic acid (VITAMIN C) 1000 MG tablet Take by mouth. 01/17/13   [provider]  aspirin EC 81 MG tablet Take 1 tablet (81 mg total) by mouth daily. 12/14/17   Larey Dresser, MD  digoxin (LANOXIN) 0.125 MG tablet Take 1 tablet (0.125 mg total) by mouth daily. 03/02/19   Larey Dresser, MD  eplerenone (INSPRA) 25 MG tablet TAKE 1 TABLET BY MOUTH ONCE DAILY 04/10/19   Larey Dresser, MD  magnesium gluconate (MAGONATE) 500 MG tablet Take 500 mg by mouth 2 (two) times daily.     [provider]  metolazone (ZAROXOLYN) 2.5 MG tablet Take 2.5 mg by mouth as needed.     [provider]  Omega-3 Fatty Acids (FISH OIL PO) Take 360 mg by mouth daily.  [provider]  potassium chloride (K-DUR,KLOR-CON) 20 MEQ tablet Take 2.5 tablets (50 mEq total) by mouth daily. 06/06/18   Larey Dresser, MD  sacubitril-valsartan (ENTRESTO) 24-26 MG Take 1 tablet by mouth 2 (two) times daily. 03/02/19   Larey Dresser, MD  torsemide (DEMADEX) 20 MG tablet Take 3 tablets  (60 mg total) by mouth every morning AND 1 tablet (20 mg total) every evening. 04/03/19   Larey Dresser, MD     Family History  Adopted: Yes  Problem Relation Age of Onset  . Diabetes Other        [place]  . Heart disease Neg Hx   . Hypertension Neg Hx     Social History   Socioeconomic History  . Marital status: Married    Spouse name: Not on file  . Number of children: Not on file  . Years of education: Not on file  . Highest education level: Not on file  Occupational History  . Occupation: PHYSICIAN    Employer: [employer]    Comment: Psychologist  Tobacco Use  . Smoking status: Never Smoker  . Smokeless tobacco: Never Used  Substance and Sexual Activity  . Alcohol use: No  . Drug use: No  . Sexual activity: Not on file  Other Topics Concern  . Not on file  Social History Narrative   Married. Wife used to see Dr. Arnoldo Morale. He saw Dr. Sherren Mocha. 2 sons- 1 is anesthesiologist. 2 grandsons.       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       Hobbies:  Model train, reading, walking in woods, prior fishing, prior golfing   Social Determinants of Health   Financial Resource Strain:   . Difficulty of Paying Living Expenses:   Food Insecurity:   . Worried About Charity fundraiser in the Last Year:   . Arboriculturist in the Last Year:   Transportation Needs:   . Film/video editor (Medical):   Marland Kitchen Lack of Transportation (Non-Medical):   Physical Activity:   . Days of Exercise per Week:   . Minutes of Exercise per Session:   Stress:   . Feeling of Stress :   Social Connections:   . Frequency of Communication with Friends and Family:   . Frequency of Social Gatherings with Friends and Family:   . Attends Religious Services:   . Active Member of Clubs or Organizations:   . Attends Archivist Meetings:   Marland Kitchen Marital Status:       Review of Systems currently denies fever,  headache, chest pain, dyspnea, cough, abdominal/back pain, nausea, vomiting or bleeding.  He has had weight loss.  Vital Signs: Blood pressure 135/63, heart rate 81, temp 98.3, respiration 18, O2 sat 100% room air   Physical Exam awake, alert.  Chest clear to auscultation bilaterally.  Heart with regular rate and rhythm.  Abdomen soft, mildly distended, positive bowel sounds, currently nontender.  Trace pretibial edema bilaterally.  Imaging: CT Chest W Contrast  Result Date: 06/27/2019 CLINICAL DATA:  Initial staging. Colorectal cancer. EXAM: CT CHEST WITH CONTRAST TECHNIQUE: Multidetector CT imaging of the chest was performed during intravenous contrast administration. CONTRAST:  62m OMNIPAQUE IOHEXOL 300 MG/ML  SOLN COMPARISON:  01/24/2003. FINDINGS: Cardiovascular: Moderate cardiac enlargement. No pericardial effusion. Previous median sternotomy and CABG procedure. The ascending thoracic aorta measures 4.1 cm, image 82/2. Aortic atherosclerosis. Mediastinum/Nodes: 1.5 cm nodule is  identified arising from the left lobe, image 67/5. Recommend thyroid US (ref: J Am Coll Radiol. 2015 Feb;12(2): 143-50). The trachea appears patent and is midline. Normal appearance of the esophagus. No enlarged mediastinal or hilar lymph nodes. Lungs/Pleura: No pleural effusion, airspace consolidation, or atelectasis. Small scattered pulmonary nodules are identified. -index nodule in the right lower lobe measures 4 mm, image 135/7. -index nodule in the posterior left upper lobe measures 3 mm, image 88/7. Not seen on study from 01/23/2003. Index nodule within the lateral right upper lobe measures 3 mm, image 49/7. Not present on study from 2004. Index nodule in the left lower lobe measures 3 mm, image 108/7. New from 2004. Upper Abdomen: No acute abnormality. Multiple liver metastases are again identified. Please see report from diagnostic CT of the abdomen pelvis dated 06/19/2019 for more details. Musculoskeletal: No acute or  suspicious bone lesions IMPRESSION: 1. Small nonspecific pulmonary nodules are identified measuring up to 4 mm. Small pulmonary metastasis cannot be excluded. 2. Liver metastases. Please see report from diagnostic CT of the abdomen pelvis dated 06/19/2019 for more details. 3. Ascending thoracic aortic aneurysm measuring 4.1 cm. Recommend annual imaging followup by CTA or MRA. This recommendation follows 2010 ACCF/AHA/AATS/ACR/ASA/SCA/SCAI/SIR/STS/SVM Guidelines for the Diagnosis and Management of Patients with Thoracic Aortic Disease. Circulation. 2010; 121: A151-I343. Aortic aneurysm NOS (ICD10-I71.9) 4. 1.5 cm left lobe of thyroid nodule. Recommend thyroid US (ref: J Am Coll Radiol. 2015 Feb;12(2): 143-50). Aortic Atherosclerosis (ICD10-I70.0). Electronically Signed   By: Kerby Moors M.D.   On: 06/27/2019 12:11   US Abdomen Complete  Result Date: 06/08/2019 CLINICAL DATA:  Abdominal swelling 8 months. EXAM: ABDOMEN ULTRASOUND COMPLETE COMPARISON:  CT 02/08/2014 FINDINGS: Gallbladder: Mild cholelithiasis with largest stone measuring 6 mm. Gallbladder wall subjectively is normal in thickness although measurement reveals 4.3 mm. No adjacent free fluid. Negative sonographic Murphy sign. Common bile duct: Diameter: 3.1 mm. Liver: Coarse heterogeneous echotexture compatible with steatosis. Evidence of multiple masses with a rounded hypoechoic mass over the right lobe measuring 3.7 cm. Slightly hyperechoic mass over the right lobe measuring 3.7 cm. Additional hyperechoic mass with central decreased echogenicity over the right lobe measuring 4.8 cm. Portal vein is patent on color Doppler imaging with normal direction of blood flow towards the liver. IVC: No abnormality visualized. Pancreas: Visualized portion unremarkable. Spleen: Size and appearance within normal limits. Right Kidney: Length: 11.1 cm. Echogenicity within normal limits. No mass or hydronephrosis visualized. Left Kidney: Length: 10.9 cm.  Echogenicity within normal limits. 1.6 cm cyst over the upper pole. No mass or hydronephrosis visualized. Abdominal aorta: No aneurysm visualized. Other findings: None. IMPRESSION: 1. Mild cholelithiasis with largest stone measuring 6 mm. Equivocal wall thickening. Recommend clinical correlation for acute cholecystitis. 2. Coarse heterogeneous echogenicity of the liver with suggestion of multiple masses with the largest over the right lobe measuring 4.8 cm. Recommend CT with contrast for further evaluation. 3.  1.6 cm left renal cyst. Electronically Signed   By: Marin Olp M.D.   On: 06/08/2019 12:54   CT Abdomen Pelvis W Contrast  Result Date: 06/19/2019 CLINICAL DATA:  Left-sided abdominal pain for a few months. Liver lesion by ultrasound. EXAM: CT ABDOMEN AND PELVIS WITH CONTRAST TECHNIQUE: Multidetector CT imaging of the abdomen and pelvis was performed using the standard protocol following bolus administration of intravenous contrast. CONTRAST:  133m ISOVUE-300 IOPAMIDOL (ISOVUE-300) INJECTION 61% COMPARISON:  Right upper quadrant ultrasound from 11 days ago FINDINGS: Lower chest: Low-density left ventricular thinning consistent with remote infarct. Hepatobiliary:  There are numerous heterogeneous low-density masses throughout liver, the largest in the right lobe at 6.1 cm. No gross underlying cirrhotic changes.Calcified gallstone without inflammatory changes. Pancreas: Unremarkable. Spleen: Unremarkable. Adrenals/Urinary Tract: Negative adrenals. No hydronephrosis or stone. Small left upper pole renal cystic density. Unremarkable bladder. Stomach/Bowel: Annular mass at the hepatic flexure of the colon, measuring 3.6 cm in length. There is mesocolic adenopathy with clustered nodules measuring up to 14 mm adjacent to the mass. No bowel obstruction or perforation. Vascular/Lymphatic: Atherosclerotic calcification that is widespread. No mass or adenopathy. Reproductive:Massive enlargement of the prostate  without asymmetry. Gland measures up to 10 cm craniocaudal. Other: No ascites or pneumoperitoneum. Small fatty epigastric hernia. Musculoskeletal: No acute abnormalities. No noted bony metastasis. L5 chronic bilateral pars defects with L5-S1 anterolisthesis. These results will be called to the ordering clinician or representative by the Radiologist Assistant, and communication documented in the PACS or [employer]. IMPRESSION: 1. Findings of colon cancer at the hepatic flexure with mesenteric adenopathy and extensive hepatic metastatic disease. 2. Chronic findings are noted above. Electronically Signed   By: Monte Fantasia M.D.   On: 06/19/2019 10:45    Labs:  CBC: Recent Labs    08/17/18 1143 05/25/19 1152 06/23/19 1451  WBC 5.7 6.5 7.2  HGB 14.4 12.6* 12.9*  HCT 44.4 37.3* 40.2  PLT 204 244.0 265    COAGS: No results for input(s): INR, APTT in the last 8760 hours.  BMP: Recent Labs    03/02/19 1253 03/02/19 1253 03/09/19 1300 04/25/19 1448 05/25/19 1152 06/23/19 1451  NA 132*   < > 137 131* 134* 139  K 3.1*   < > 4.0 3.9 4.1 3.9  CL 88*   < > 98 95* 96 96*  CO2 30   < > _0 GLUCOSE 100*   < > 82 105* 103* 106*  BUN 22   < > _1 CALCIUM 9.2   < > 9.0 8.8* 9.9 9.4  CREATININE 1.26*   < > 1.07 0.92 1.10 1.18  GFRNONAA 54*  --  >60 >60  --  58*  GFRAA >60  --  >60 >60  --  >60   < > = values in this interval not displayed.    LIVER FUNCTION TESTS: Recent Labs    05/25/19 1152 06/23/19 1451  BILITOT 0.8 1.0  AST 36 56*  ALT 18 26  ALKPHOS 100 124  PROT 7.7 8.1  ALBUMIN 3.9 3.7    TUMOR MARKERS: No results for input(s): AFPTM, CEA, CA199, CHROMGRNA in the last 8760 hours.  Assessment and Plan: 79 y.o. male with multiple medical problems including paroxysmal atrial fibrillation, coronary artery disease/ischemic cardiomyopathy, GERD, CHF with ejection fraction of 20%, hyperlipidemia, hypertension, obstructive sleep apnea, prior TIA.   Secondary to abdominal pain/distention patient underwent recent imaging which revealed findings of colon cancer at the hepatic flexure with mesenteric adenopathy and extensive hepatic metastatic disease, small pulmonary nodules, ascending thoracic aortic aneurysm measuring 4.1 cm, 1.5 cm left thyroid nodule.  Due to significant cardiomyopathy GI/oncology services felt that liver biopsy was safer approach than colonoscopy for tissue diagnosis.  He presents today for image guided liver lesion biopsy for further evaluation.Risks and benefits of procedure was discussed with the patient  including, but not limited to bleeding, infection, damage to adjacent structures or low yield requiring additional tests.  All of the questions were answered and there is agreement to proceed.  Consent signed and in chart.  LABS PENDING   Thank you for this interesting consult.  I greatly enjoyed meeting BROLIN DAMBROSIA and look forward to participating in their care.  A copy of this report was sent to the requesting provider on this date.  Electronically Signed: D. Rowe Robert, PA-C 06/30/2019, 11:28 AM   I spent a total of 25 minutes  in face to face in clinical consultation, greater than 50% of which was counseling/coordinating care for image guided liver lesion biopsy

## 2019-06-30 NOTE — Discharge Instructions (Signed)
Urgent needs on call MD [phone]  May remove bandaid in 24 hours and shower For Pain Ice Pack  Moderate Conscious Sedation, Adult, Care After These instructions provide you with information about caring for yourself after your procedure. Your health care provider may also give you more specific instructions. Your treatment has been planned according to current medical practices, but problems sometimes occur. Call your health care provider if you have any problems or questions after your procedure. What can I expect after the procedure? After your procedure, it is common:  To feel sleepy for several hours.  To feel clumsy and have poor balance for several hours.  To have poor judgment for several hours.  To vomit if you eat too soon. Follow these instructions at home: For at least 24 hours after the procedure:  Do not: ? Participate in activities where you could fall or become injured. ? Drive. ? Use heavy machinery. ? Drink alcohol. ? Take sleeping pills or medicines that cause drowsiness. ? Make important decisions or sign legal documents. ? Take care of children on your own.  Rest. Eating and drinking  Follow the diet recommended by your health care provider.  If you vomit: ? Drink water, juice, or soup when you can drink without vomiting. ? Make sure you have little or no nausea before eating solid foods. General instructions  Have a responsible adult stay with you until you are awake and alert.  Take over-the-counter and prescription medicines only as told by your health care provider.  If you smoke, do not smoke without supervision.  Keep all follow-up visits as told by your health care provider. This is important. Contact a health care provider if:  You keep feeling nauseous or you keep vomiting.  You feel light-headed.  You develop a rash.  You have a fever. Get help right away if:  You have trouble breathing. This information is not intended to  replace advice given to you by your health care provider. Make sure you discuss any questions you have with your health care provider. Document Revised: 02/19/2017 Document Reviewed: 06/29/2015 Elsevier Patient Education  [place].   Liver Biopsy, Care After These instructions give you information about how to care for yourself after your procedure. Your health care provider may also give you more specific instructions. If you have problems or questions, contact your health care provider. What can I expect after the procedure? After your procedure, it is common to have:  Pain and soreness in the area where the biopsy was done.  Bruising around the area where the biopsy was done.  Sleepiness and fatigue for 1-2 days. Follow these instructions at home: Medicines  Take over-the-counter and prescription medicines only as told by your health care provider.  If you were prescribed an antibiotic medicine, take it as told by your health care provider. Do not stop taking the antibiotic even if you start to feel better.  Do not take medicines such as aspirin and ibuprofen unless your health care provider tells you to take them. These medicines thin your blood and can increase the risk of bleeding.  If you are taking prescription pain medicine, take actions to prevent or treat constipation. Your health care provider may recommend that you: ? Drink enough fluid to keep your urine pale yellow. ? Eat foods that are high in fiber, such as fresh fruits and vegetables, whole grains, and beans. ? Limit foods that are high in fat and processed sugars, such as fried  or sweet foods. ? Take an over-the-counter or prescription medicine for constipation. Incision care  Follow instructions from your health care provider about how to take care of your incision. Make sure you: ? Wash your hands with soap and water before you change your bandage (dressing). If soap and water are not available, use hand  sanitizer. ? Change your dressing as told by your health care provider. ? Leave stitches (sutures), skin glue, or adhesive strips in place. These skin closures may need to stay in place for 2 weeks or longer. If adhesive strip edges start to loosen and curl up, you may trim the loose edges. Do not remove adhesive strips completely unless your health care provider tells you to do that.  Check your incision area every day for signs of infection. Check for: ? Redness, swelling, or pain. ? Fluid or blood. ? Warmth. ? Pus or a bad smell.  Do not take baths, swim, or use a hot tub until your health care provider says it is okay to do so. Activity  Rest at home for 1-2 days, or as directed by your health care provider. ? Avoid sitting for a long time without moving. Get up to take short walks every 1-2 hours. This is important to improve blood flow and breathing. Ask for help if you feel weak or unsteady.  Return to your normal activities as told by your health care provider. Ask your health care provider what activities are safe for you.  Do not drive or use heavy machinery while taking prescription pain medicine.  Do not lift anything that is heavier than 10 lb (4.5 kg), or the limit that your health care provider tells you, until he or she says that it is safe.  Do not play contact sports for 2 weeks after the procedure. General instructions  Do not drink alcohol in the first week after the procedure.  Have someone stay with you for at least 24 hours after the procedure.  It is your responsibility to obtain your test results. Ask your health care provider, or the department that is doing the test: ? When will my results be ready? ? How will I get my results? ? What are my treatment options? ? What other tests do I need? ? What are my next steps?  Keep all follow-up visits as told by your health care provider. This is important. Contact a health care provider if:  You have increased  bleeding from an incision, resulting in more than a small spot of blood.  You have redness, swelling, or increasing pain in any incisions.  You notice a discharge or a bad smell coming from any of your incisions.  You have a fever or chills. Get help right away if:  You develop swelling, bloating, or pain in your abdomen.  You become dizzy or faint.  You develop a rash.  You have nausea or you vomit.  You faint, or you have shortness of breath or difficulty breathing.  You develop chest pain.  You have problems with your speech or vision.  You have trouble with your balance or moving your arms or legs. Summary  After the liver biopsy, it is common to have pain, soreness, and bruising in the area, as well as sleepiness and fatigue.  Take over-the-counter and prescription medicines only as told by your health care provider.  Follow instructions from your health care provider about how to care for your incision. Check the incision area daily for signs  of infection. This information is not intended to replace advice given to you by your health care provider. Make sure you discuss any questions you have with your health care provider. Document Revised: 05/02/2018 Document Reviewed: 03/19/2017 Elsevier Patient Education  2020 [employer].

## 2019-06-30 NOTE — Procedures (Signed)
Interventional Radiology Procedure Note  Procedure: US guided core biopsy of liver  Complications: None  Estimated Blood Loss: None  Recommendations: - Bedrest x 2 hrs - DC home  Signed,  Criselda Peaches, MD

## 2019-07-03 ENCOUNTER — Encounter: Payer: Medicare Other | Admitting: Gastroenterology

## 2019-07-03 ENCOUNTER — Other Ambulatory Visit: Payer: Self-pay | Admitting: Physician Assistant

## 2019-07-03 DIAGNOSIS — C19 Malignant neoplasm of rectosigmoid junction: Secondary | ICD-10-CM

## 2019-07-03 LAB — SURGICAL PATHOLOGY

## 2019-07-03 MED FILL — ENTRESTO 24 MG-26 MG TABLET: 24-26 | 30 days supply | Qty: 60 | Fill #4

## 2019-07-05 ENCOUNTER — Other Ambulatory Visit: Payer: Self-pay

## 2019-07-05 ENCOUNTER — Encounter: Payer: Self-pay | Admitting: Hematology

## 2019-07-05 ENCOUNTER — Inpatient Hospital Stay (HOSPITAL_BASED_OUTPATIENT_CLINIC_OR_DEPARTMENT_OTHER): Payer: Medicare Other | Admitting: Hematology

## 2019-07-05 VITALS — BP 143/68 | HR 76 | Temp 98.6°F | Resp 18 | Ht 70.0 in | Wt 144.2 lb

## 2019-07-05 DIAGNOSIS — I509 Heart failure, unspecified: Secondary | ICD-10-CM | POA: Diagnosis not present

## 2019-07-05 DIAGNOSIS — I110 Hypertensive heart disease with heart failure: Secondary | ICD-10-CM | POA: Diagnosis not present

## 2019-07-05 DIAGNOSIS — C19 Malignant neoplasm of rectosigmoid junction: Secondary | ICD-10-CM | POA: Diagnosis not present

## 2019-07-05 DIAGNOSIS — C787 Secondary malignant neoplasm of liver and intrahepatic bile duct: Secondary | ICD-10-CM | POA: Diagnosis not present

## 2019-07-05 DIAGNOSIS — R590 Localized enlarged lymph nodes: Secondary | ICD-10-CM | POA: Diagnosis not present

## 2019-07-05 DIAGNOSIS — I272 Pulmonary hypertension, unspecified: Secondary | ICD-10-CM | POA: Diagnosis not present

## 2019-07-05 MED ORDER — CAPECITABINE 500 MG PO TABS: 900.0000 mg/m2 | ORAL_TABLET | Freq: Two times a day (BID) | ORAL | 0 refills | Status: DC

## 2019-07-05 MED FILL — EPLERENONE 25 MG TABS: 25 | 30 days supply | Qty: 30 | Fill #3

## 2019-07-05 NOTE — Progress Notes (Signed)
START ON PATHWAY REGIMEN - Colorectal     A cycle is every 21 days:     Capecitabine      Bevacizumab-xxxx   **Always confirm dose/schedule in your pharmacy ordering system**  Patient Characteristics: Distant Metastases, Nonsurgical Candidate, KRAS/NRAS Mutation Positive/Unknown (BRAF V600 Wild-Type/Unknown), Standard Cytotoxic Therapy, First Line Standard Cytotoxic Therapy, Bevacizumab Eligible, PS > 1 Tumor Location: Colon Therapeutic Status: Distant Metastases Microsatellite/Mismatch Repair Status: Unknown BRAF Mutation Status: Awaiting Test Results KRAS/NRAS Mutation Status: Awaiting Test Results Standard Cytotoxic Line of Therapy: First Line Standard Cytotoxic Therapy ECOG Performance Status: 2 Bevacizumab Eligibility: Eligible Intent of Therapy: Non-Curative / Palliative Intent, Discussed with Patient

## 2019-07-06 ENCOUNTER — Telehealth: Payer: Self-pay | Admitting: Pharmacist

## 2019-07-06 ENCOUNTER — Telehealth: Payer: Self-pay

## 2019-07-06 DIAGNOSIS — C19 Malignant neoplasm of rectosigmoid junction: Secondary | ICD-10-CM

## 2019-07-06 MED ORDER — XELODA 500 MG PO TABS: 1500.0000 mg | ORAL_TABLET | Freq: Two times a day (BID) | ORAL | 0 refills | Status: DC

## 2019-07-06 NOTE — Telephone Encounter (Signed)
Oral Oncology Patient Advocate Encounter  After completing a benefits investigation, prior authorization for Xeloda is not required at this time through Medicare B.  Patient's copay is $29.29.    [facility] Phone [phone] Fax [phone] 07/06/2019 10:24 AM

## 2019-07-06 NOTE — Telephone Encounter (Signed)
Oral Chemotherapy Pharmacist Encounter   MD awaiting [facility] results and will let us know when to contact the patient about Xeloda.  Darl Pikes, PharmD, BCPS, BCOP, CPP Hematology/Oncology Clinical Pharmacist [facility] [phone]  07/06/2019 4:24 PM

## 2019-07-06 NOTE — Progress Notes (Signed)
Faxed patients oncology records to Dr. Lorri Frederick, [facility] in [place] at fax [phone] per patient's request.

## 2019-07-06 NOTE — Telephone Encounter (Signed)
Oral Oncology Pharmacist Encounter  Received new prescription for Xeloda (capecitabine) for the treatment of metastatic colon cancer in conjunction with Avastin, planned duration until disease progression or unacceptable drug toxicity.  CMP from 06/30/19 assessed, no relevant lab abnormalities. Prescription dose and frequency assessed.   Current medication list in Epic reviewed, no DDIs with capecitabine identified.  Prescription has been e-scribed to the [facility] for benefits analysis and approval.  Oral Oncology Clinic will continue to follow for insurance authorization, copayment issues, initial counseling and start date.  Darl Pikes, PharmD, BCPS, BCOP, CPP Hematology/Oncology Clinical Pharmacist [facility] [phone]  07/06/2019 1:55 PM

## 2019-07-11 ENCOUNTER — Telehealth: Payer: Self-pay | Admitting: Hematology

## 2019-07-11 NOTE — Telephone Encounter (Signed)
I called pt's son [name] today, per his request. I reviewed his diagnosis, stage, and treatment plan with him in detail. I answered his questions, particularly about role of surgery, and systemic therapy options. He appreciated the call.   I also sent a message to pathology lab to f/u his FO status.  Truitt Merle  07/11/2019

## 2019-07-12 ENCOUNTER — Encounter: Payer: Self-pay | Admitting: Hematology

## 2019-07-14 DIAGNOSIS — C19 Malignant neoplasm of rectosigmoid junction: Secondary | ICD-10-CM | POA: Diagnosis not present

## 2019-07-14 NOTE — Progress Notes (Signed)
Patient calls to let us know that he is having increasing feeling of fatigue, he is feeling the abdominal cramping more often however still intermittent, having regular bowel movements, denies any N/V, when he has the cramping he rates it a 5/10.  Noticed a little instability when getting out of bed (going from laying to standing position). He admits to not drinking enough water.  I have instructed him to increase his fluid intake to at least 6 to 8 glasses a day.  Also to sit on the side of the bed a couple of minutes before standing.   I told him I would make Dr. Burr Medico aware. He verbalized an understanding.

## 2019-07-17 ENCOUNTER — Encounter: Payer: Self-pay | Admitting: Hematology

## 2019-07-17 ENCOUNTER — Other Ambulatory Visit: Payer: Self-pay

## 2019-07-17 ENCOUNTER — Inpatient Hospital Stay (HOSPITAL_BASED_OUTPATIENT_CLINIC_OR_DEPARTMENT_OTHER): Payer: Medicare Other | Admitting: Hematology

## 2019-07-17 ENCOUNTER — Encounter (HOSPITAL_COMMUNITY): Payer: Self-pay | Admitting: Hematology

## 2019-07-17 VITALS — BP 129/48 | HR 77 | Temp 98.5°F | Resp 17 | Ht 70.0 in | Wt 141.1 lb

## 2019-07-17 DIAGNOSIS — C19 Malignant neoplasm of rectosigmoid junction: Secondary | ICD-10-CM | POA: Diagnosis not present

## 2019-07-17 DIAGNOSIS — I5022 Chronic systolic (congestive) heart failure: Secondary | ICD-10-CM | POA: Diagnosis not present

## 2019-07-17 DIAGNOSIS — C787 Secondary malignant neoplasm of liver and intrahepatic bile duct: Secondary | ICD-10-CM | POA: Diagnosis not present

## 2019-07-17 DIAGNOSIS — I110 Hypertensive heart disease with heart failure: Secondary | ICD-10-CM | POA: Diagnosis not present

## 2019-07-17 DIAGNOSIS — I272 Pulmonary hypertension, unspecified: Secondary | ICD-10-CM | POA: Diagnosis not present

## 2019-07-17 DIAGNOSIS — R590 Localized enlarged lymph nodes: Secondary | ICD-10-CM | POA: Diagnosis not present

## 2019-07-17 DIAGNOSIS — I509 Heart failure, unspecified: Secondary | ICD-10-CM | POA: Diagnosis not present

## 2019-07-17 NOTE — Progress Notes (Signed)
Caleb [name]   [phone] [phone]   Clinic Follow up Note   Patient Care Team: Caleb Koch, MD as PCP - General (Internal Medicine) Caleb Stain, MD (Pulmonary Disease) Caleb Sprang, MD as Consulting Physician (Cardiology) Caleb Finner, RN as Oncology Nurse Navigator  Date of Service:  07/17/2019  CHIEF COMPLAINT: Metastatic colorectal adenocarcinoma  SUMMARY OF ONCOLOGIC HISTORY: Oncology History Overview Note  Suspicious stage IV colorectal cancer   Metastatic carcinoma to liver ([place])  06/08/2019 Imaging   US Abdomen  IMPRESSION: 1. Mild cholelithiasis with largest stone measuring 6 mm. Equivocal wall thickening. Recommend clinical correlation for acute cholecystitis.   2. Coarse heterogeneous echogenicity of the liver with suggestion of multiple masses with the largest over the right lobe measuring 4.8 cm. Recommend CT with contrast for further evaluation.   3.  1.6 cm left renal cyst.   06/19/2019 Imaging   CT AP W Contrast  IMPRESSION: 1. Findings of colon cancer at the hepatic flexure with mesenteric adenopathy and extensive hepatic metastatic disease. 2. Chronic findings are noted above.   06/23/2019 Initial Diagnosis   Metastatic carcinoma to liver ([place]). Suspicious stage IV colorectal cancer.    06/23/2019 Tumor Marker   CEA 98.19    06/27/2019 Imaging   CT Chest W contrast IMPRESSION: 1. Small nonspecific pulmonary nodules are identified measuring up to 4 mm. Small pulmonary metastasis cannot be excluded. 2. Liver metastases. Please see report from diagnostic CT of the abdomen pelvis dated 06/19/2019 for more details. 3. Ascending thoracic aortic aneurysm measuring 4.1 cm. Recommend annual imaging followup by CTA or MRA. This recommendation follows 2010 ACCF/AHA/AATS/ACR/ASA/SCA/SCAI/SIR/STS/SVM Guidelines for the Diagnosis and Management of Patients with Thoracic Aortic Disease. Circulation.  2010; 121: P915-A569. Aortic aneurysm NOS (ICD10-I71.9) 4. 1.5 cm left lobe of thyroid nodule. Recommend thyroid US (ref: J Am Coll Radiol. 2015 Feb;12(2): 143-50).   Aortic Atherosclerosis (ICD10-I70.0).   06/30/2019 Initial Biopsy   FINAL MICROSCOPIC DIAGNOSIS:   A. LIVER, BIOPSY:  - Adenocarcinoma consistent with metastatic colorectal adenocarcinoma.  - See comment.   COMMENT:  There is adenocarcinoma with necrosis and the morphologic features are  consistent with metastatic colorectal adenocarcinoma.  There is  sufficient tissue for additional testing.    06/30/2019 Genetic Testing   KRAS G13D mutation NRAS wildtype (codons 12, 13, 59, 61, 117 and 146 in exons 2, 3 and 4) MSI-Stable  Tumor mutational burden - 4Muts/Mb APC [id number] ERBB3 D297Y FAM123B E380 PIK3CA H1047R TP53 R213   Colorectal cancer, stage IV ([place]  07/03/2019 Initial Diagnosis   Colorectal cancer, stage IV ([facility])      CURRENT THERAPY:  PENDING Xeloda and Avastin  INTERVAL HISTORY:  Caleb WOMBLESis here for a follow up. He presents to the clinic with his wife. He noes he is more fatigued and has more frequent left abdominal ache, intermittent. He is still able to function adequately and may need to lay down in the afternoon. He denies N&V and notes he has BM and uses stool softener every 4-5 days. He denies GI bleeding. He has adequate appetite and eating. He emailed Dr. SJilda Rochelast week. His wife and him are interested in some natural supplements to aid his treatment. He is interested in alternative medicine as well. He is on extensive Multivitamins and tumeric. He is considering Milk thistle and berberine natural supplements along with Metformin.     REVIEW OF SYSTEMS:   Constitutional: Denies fevers, chills or abnormal weight loss (+)  Fatigue  Eyes: Denies blurriness of vision Ears, nose, mouth, throat, and face: Denies mucositis or sore throat Respiratory: Denies cough, dyspnea or  wheezes Cardiovascular: Denies palpitation, chest discomfort or lower extremity swelling Gastrointestinal:  Denies nausea, heartburn or change in bowel habits (+) intermittent left abdominal ache Skin: Denies abnormal skin rashes Lymphatics: Denies new lymphadenopathy or easy bruising Neurological:Denies numbness, tingling or new weaknesses Behavioral/Psych: Mood is stable, no new changes  All other systems were reviewed with the patient and are negative.  MEDICAL HISTORY:  Past Medical History:  Diagnosis Date  . Allergic rhinitis   . Atrial fibrillation ([place])   . Atypical pneumonia   . CAD (coronary artery disease)   . Cardiomyopathy, ischemic   . Chronic anticoagulation   . Cough   . Dizziness   . GERD (gastroesophageal reflux disease)   . Heart failure, systolic, acute on chronic ([place])   . Hyperlipidemia   . Hypertension   . OSA (obstructive sleep apnea)    Home sleep test 07/05/2009 AHI 8.2  . Pleural effusion   . Positional vertigo   . TIA (transient ischemic attack)     SURGICAL HISTORY: Past Surgical History:  Procedure Laterality Date  . AORTIC VALVE REPAIR  01/09/13  . CARDIAC CATHETERIZATION N/A 01/10/2016   Procedure: Right Heart Cath;  Surgeon: Larey Dresser, MD;  Location: [facility];  Service: Cardiovascular;  Laterality: N/A;  . CORONARY ARTERY BYPASS GRAFT  01/09/13   LAD LIMA, left atrial appendage  . CORONARY STENT PLACEMENT  2004   LAD  . MITRAL VALVE ANNULOPLASTY  01/09/13  . RIGHT HEART CATH N/A 11/16/2017   Procedure: RIGHT HEART CATH;  Surgeon: Larey Dresser, MD;  Location: [facility];  Service: Cardiovascular;  Laterality: N/A;  . RIGHT HEART CATHETERIZATION N/A 05/11/2014   Procedure: RIGHT HEART CATH;  Surgeon: Larey Dresser, MD;  Location[facility] LAB;  Service: Cardiovascular;  Laterality: N/A;  . SHOULDER SURGERY    . TEE WITHOUT CARDIOVERSION N/A 09/21/2012   Procedure: TRANSESOPHAGEAL ECHOCARDIOGRAM (TEE);  Surgeon:  Larey Dresser, MD;  Location: [facility] ENDOSCOPY;  Service: Cardiovascular;  Laterality: N/A;    I have reviewed the social history and family history with the patient and they are unchanged from previous note.  ALLERGIES:  is allergic to carvedilol; codeine; opsumit [macitentan]; dabigatran; pradaxa [dabigatran etexilate mesylate]; sulfonamide derivatives; and xarelto [rivaroxaban].  MEDICATIONS:  Current Outpatient Medications  Medication Sig Dispense Refill  . ascorbic acid (VITAMIN C) 1000 MG tablet Take by mouth.    Marland Kitchen aspirin EC 81 MG tablet Take 1 tablet (81 mg total) by mouth daily. 30 tablet 3  . digoxin (LANOXIN) 0.125 MG tablet Take 1 tablet (0.125 mg total) by mouth daily. 90 tablet 3  . eplerenone (INSPRA) 25 MG tablet TAKE 1 TABLET BY MOUTH ONCE DAILY 30 tablet 11  . magnesium gluconate (MAGONATE) 500 MG tablet Take 500 mg by mouth 2 (two) times daily.     . metolazone (ZAROXOLYN) 2.5 MG tablet Take 2.5 mg by mouth as needed.     . Omega-3 Fatty Acids (FISH OIL PO) Take 360 mg by mouth daily.     . potassium chloride (K-DUR,KLOR-CON) 20 MEQ tablet Take 2.5 tablets (50 mEq total) by mouth daily. 225 tablet 3  . sacubitril-valsartan (ENTRESTO) 24-26 MG Take 1 tablet by mouth 2 (two) times daily. 60 tablet 5  . torsemide (DEMADEX) 20 MG tablet Take 3 tablets (60 mg total) by  mouth every morning AND 1 tablet (20 mg total) every evening. 540 tablet 2  . XELODA 500 MG tablet Take 3 tablets (1,500 mg total) by mouth 2 (two) times daily after a meal. Take for 7 days, then hold for 7 days. Repeat every 14 days. 84 tablet 0   No current facility-administered medications for this visit.    PHYSICAL EXAMINATION: ECOG PERFORMANCE STATUS: 2 - Symptomatic, <50% confined to bed  Vitals:   07/17/19 1320  BP: (!) 129/48  Pulse: 77  Resp: 17  Temp: 98.5 F (36.9 C)  SpO2: 100%   Filed Weights   07/17/19 1320  Weight: 141 lb 1.6 oz (64 kg)    Due to COVID19 we will limit examination to  appearance. Patient had no complaints.  GENERAL:alert, no distress and comfortable SKIN: skin color normal, no rashes or significant lesions EYES: normal, Conjunctiva are pink and non-injected, sclera clear  NEURO: alert & oriented x 3 with fluent speech   LABORATORY DATA:  I have reviewed the data as listed CBC Latest Ref Rng & Units 06/30/2019 06/23/2019 05/25/2019  WBC 4.0 - 10.5 K/uL 7.2 7.2 6.5  Hemoglobin 13.0 - 17.0 g/dL 12.8(L) 12.9(L) 12.6(L)  Hematocrit 39.0 - 52.0 % 39.7 40.2 37.3(L)  Platelets 150 - 400 K/uL 261 265 244.0     CMP Latest Ref Rng & Units 06/30/2019 06/23/2019 05/25/2019  Glucose 70 - 99 mg/dL 100(H) 106(H) 103(H)  BUN 8 - 23 mg/dL 26(H) 20 22  Creatinine 0.61 - 1.24 mg/dL 1.05 1.18 1.10  Sodium 135 - 145 mmol/L 135 139 134(L)  Potassium 3.5 - 5.1 mmol/L 4.3 3.9 4.1  Chloride 98 - 111 mmol/L 94(L) 96(L) 96  CO2 22 - 32 mmol/L _0 Calcium 8.9 - 10.3 mg/dL 9.1 9.4 9.9  Total Protein 6.5 - 8.1 g/dL 8.2(H) 8.1 7.7  Total Bilirubin 0.3 - 1.2 mg/dL 1.2 1.0 0.8  Alkaline Phos 38 - 126 U/L 119 124 100  AST 15 - 41 U/L 69(H) 56(H) 36  ALT 0 - 44 U/L _1 RADIOGRAPHIC STUDIES: I have personally reviewed the radiological images as listed and agreed with the findings in the report. No results found.   ASSESSMENT & PLAN:  Caleb [name] is a 79 y.o. male with    1.Stage IV colorectal cancer of the right hepatic flexure with metastatic adenopathy and extensive hepatic metastatic disease. -Initial CT scan on 06/19/2019 showed high suspicion for colorectal cancer with a right hepatic flexure mass with metastatic adenopathy and extensive hepatic metastatic disease. -Patient at high risk for colonoscopy due to congestive heart failure (ejection fraction 10 to 15%09/2018).  -His liver biopsy from 06/30/19 shows Adenocarcinoma consistent with metastatic colorectal adenocarcinoma. -I discussed at this stage his cancer is not curable and I do not recommend surgery  since he has no significant bleeding, bowel obstruction or high risk of perforation. I answered his questions about surgery  -Patient is also interested in liver targeted therapy.  He is not a candidate for liver ablation, I discussed the option of embolization, but I do not recommend due to his untreated primary colon cancer and node metastasis. -I discussed standard treatment would include chemotherapy but due to his advanced age and significant comorbidities, especially congestive heart failure with EF 20%, he is not a candidate for intravenous chemotherapy.but I think low intensity chemo such as Xeloda will be an option.  -I discussed his FO results in great detail  with him today. He has KRAS mutation which means he is not eligible for target EGFR inhibitor treatments. He has MSI-Stable disease which means his cancer will not respond to immunotherapy alone such as Keytruda.  -I discussed there is clinical trail data of immunotherapy Nivolumab with oral biological agent Stivarga being more beneficial in MSI-S disease. However, I would not recommend this as first line treatment given Nivo is not FDA approved for his cancer and we has less clinical data about this combination (phase 3 clinical is pending). But I would consider this regimen if he does not response to Xeloda and Avastin. He voiced good understanding.  -I recommend first-line treatment with less intensive oral chemo Xeloda 1511m BID 1 week on/1 week off with IV biological agent Avastin q2 weeks. Goal of therapy is palliative, to prolong his life and improve his quality of life and systemic treatment would be continued for as long as he tolerated and it is controlling his disease. I discussed with cardiologist Dr MAlgernon Huxleywho has cleared him.  We again reviewed the potential side effect from above agents in details, particularly fatigue, cytopenias, skin toxicity, hypertension, proteinuria, small risk of thrombosis and bleeding etc. -His  baseline CEA at 98.19 on 06/23/19. I would monitor response to treatment with CEA every 2 weeks and CT scan every 3-4 months.  -He has been more fatigued lately with more frequent left abdominal aches. He is concerned this is related to his cancer. I discussed if he responds to treatment his cancer symptoms should improve.  -I discussed if he does not want to proceed with treatment hospice care is available to him and we would manage his symptoms.  -He will think about treatment route. He is interested in alternative medicine to help. I will review with pharmacy for supplements he can take on treatment.  -I will call his son also, his anesthesiologist and helps his father to make a final decision. -F/u open. He will have Chemo education class and see me before start of treatment.    2. Constipation -I encouraged the patient to continue to use stool softeners and laxatives.The goal is to achieve a bowel movement daily.  -I previously discussed that some of his abdominal pain may be attributed to gas pain. -BM have been adequate with stool softener every 4-5 days.    3. Afib, CAD, CHF with EF20%, HTN, S/p stroke -He is concerned about chemo treatment on his heart function. Will monitor heart function and BP on oral chemo and avastin if he chooses to proceed.  -He will continue to f/u with her cardiologist. I will discuss avastin use with his cardiologist.  -He is on ASan Luisand NEO40 which is a beet root extract that acts ad a vasodilator.  -He has small lung nodules as seen on 06/27/19 CT chest, will monitor on future scans.   4. Goal of care discussion  -We again discussed the incurable nature of his cancer, and the overall poor prognosis, especially if he does not have good response to chemotherapy or progress on chemo -The patient understands the goal of care is palliative. -I recommend DNR/DNI, he will think about it    PLAN: -I recommend first-line treatment with Xeloda and  Avastin, he will think about it, and let me know his decision -I plan to call his son to update the FO result and treatment recommendation  -will check with our oral pharmacy about his supplements' interaction with chemo    No problem-specific Assessment & Plan  notes found for this encounter.   No orders of the defined types were placed in this encounter.  All questions were answered. The patient knows to call the clinic with any problems, questions or concerns. No barriers to learning was detected. The total time spent in the appointment was 45 minutes.     Truitt Merle, MD 07/17/2019   I, Joslyn Devon, am acting as scribe for Truitt Merle, MD.   I have reviewed the above documentation for accuracy and completeness, and I agree with the above.

## 2019-07-18 ENCOUNTER — Telehealth: Payer: Self-pay | Admitting: Hematology

## 2019-07-18 NOTE — Telephone Encounter (Signed)
No los per 4/26

## 2019-07-18 NOTE — Progress Notes (Signed)
Patient calls with questions regarding Xeloda and Avastin.  I have reviewed both medications, use, route and side effects.  His questions were answered.  He is struggling with making a decision regarding whether to proceed or not with this regimen and will let us know.

## 2019-07-19 NOTE — Progress Notes (Signed)
I received a call from [facility] in [place] requesting records per his request.  I called the patient to confirm he had requested his records be sent to them and he confirmed.  I have faxed most recent office notes, scan results, pathology and molecular testing results to fax [phone].  I received confirmation this went through.

## 2019-07-20 MED FILL — DIGOXIN 0.125 MG TABLET: 125 | 90 days supply | Qty: 90 | Fill #1

## 2019-07-21 NOTE — Progress Notes (Signed)
Patient calls stating that today he has a significant drop in his energy level.  Denies any blood in stools, no black tarry stools, eating and drinking well.  I consulted with Dr. Burr Medico and she feels this is directly related to the cancer.  I called the patient back and informed him.  He verbalized an understanding.  He will monitor it over the weekend and call back on Monday if he feels he needs to be seen or have blood work.

## 2019-07-24 ENCOUNTER — Telehealth: Payer: Self-pay | Admitting: Hematology

## 2019-07-24 NOTE — Telephone Encounter (Signed)
I called pt today to check how he is doing and if he has made a decision on chemotherapy yet.  He has been more fatigued, but still functions relatively well at home.  No other new complaints.  He is exploring other alternative cancer treatment options. He has not ruled out chemo option yet, but certainly not ready to take chemo.  I discussed palliative care at home, he does not feel he needs it now.  He does not have a follow-up appointment, and does not want to schedule for now.  He does plan to return for follow-up and lab work at some point, and will call me when he is ready.  He knows that we are here to support him.  He appreciated the call.  Truitt Merle  07/24/2019

## 2019-07-27 ENCOUNTER — Other Ambulatory Visit: Payer: Self-pay

## 2019-07-27 ENCOUNTER — Other Ambulatory Visit (HOSPITAL_COMMUNITY): Payer: Self-pay | Admitting: Cardiology

## 2019-07-27 DIAGNOSIS — C19 Malignant neoplasm of rectosigmoid junction: Secondary | ICD-10-CM

## 2019-07-27 MED FILL — TORSEMIDE 20 MG TABLET: 20 | 90 days supply | Qty: 540 | Fill #1

## 2019-07-27 NOTE — Progress Notes (Signed)
Patient calls asking to have blood work done on 5/10 and has a lot of questions regarding his nutritional intake and would like expert advice.  I have scheduled him with Ernestene Kiel our nutritionist on Monday 5/10 at 11:15 in person at [facility] and blood work at 12:00 same day.  I have made Dr. Burr Medico aware.

## 2019-07-27 NOTE — Progress Notes (Signed)
bc

## 2019-07-28 ENCOUNTER — Telehealth: Payer: Self-pay

## 2019-07-28 MED FILL — POTASSIUM CHLORIDE CRYS ER: 20 | 90 days supply | Qty: 225 | Fill #0

## 2019-07-28 NOTE — Telephone Encounter (Signed)
Notified patient and his wife that Dr. Burr Medico will call him around 4:00 pm on Monday 5/10 to discuss his lab results.  He verbalized an understanding and was appreciative of the call.

## 2019-07-28 NOTE — Progress Notes (Signed)
[place]   Telephone:([phone]) [phone] Fax:(336) [phone]   Clinic Follow up Note   Patient Care Team: Hoyt Koch, MD as PCP - General (Internal Medicine) Elsie Stain, MD (Pulmonary Disease) Deboraha Sprang, MD as Consulting Physician (Cardiology) Jonnie Finner, RN as Oncology Nurse Navigator   I connected with [name] on 07/31/2019 at  4:00 PM EDT by telephone visit and verified that I am speaking with the correct person using two identifiers.  I discussed the limitations, risks, security and privacy concerns of performing an evaluation and management service by telephone and the availability of in person appointments. I also discussed with the patient that there may be a patient responsible charge related to this service. The patient expressed understanding and agreed to proceed.   Other persons participating in the visit and their role in the encounter:  None   Patient's location:  His home  Provider's location:  My Office   CHIEF COMPLAINT: F/u of Metastaticcolorectal adenocarcinoma  SUMMARY OF ONCOLOGIC HISTORY: Oncology History Overview Note  Suspicious stage IV colorectal cancer   Metastatic carcinoma to liver ([place])  06/08/2019 Imaging   US Abdomen  IMPRESSION: 1. Mild cholelithiasis with largest stone measuring 6 mm. Equivocal wall thickening. Recommend clinical correlation for acute cholecystitis.   2. Coarse heterogeneous echogenicity of the liver with suggestion of multiple masses with the largest over the right lobe measuring 4.8 cm. Recommend CT with contrast for further evaluation.   3.  1.6 cm left renal cyst.   06/19/2019 Imaging   CT AP W Contrast  IMPRESSION: 1. Findings of colon cancer at the hepatic flexure with mesenteric adenopathy and extensive hepatic metastatic disease. 2. Chronic findings are noted above.   06/23/2019 Initial Diagnosis   Metastatic carcinoma to liver ([place]). Suspicious stage IV  colorectal cancer.    06/23/2019 Tumor Marker   CEA 98.19    06/27/2019 Imaging   CT Chest W contrast IMPRESSION: 1. Small nonspecific pulmonary nodules are identified measuring up to 4 mm. Small pulmonary metastasis cannot be excluded. 2. Liver metastases. Please see report from diagnostic CT of the abdomen pelvis dated 06/19/2019 for more details. 3. Ascending thoracic aortic aneurysm measuring 4.1 cm. Recommend annual imaging followup by CTA or MRA. This recommendation follows 2010 ACCF/AHA/AATS/ACR/ASA/SCA/SCAI/SIR/STS/SVM Guidelines for the Diagnosis and Management of Patients with Thoracic Aortic Disease. Circulation. 2010; 121: S937-D428. Aortic aneurysm NOS (ICD10-I71.9) 4. 1.5 cm left lobe of thyroid nodule. Recommend thyroid US (ref: J Am Coll Radiol. 2015 Feb;12(2): 143-50).   Aortic Atherosclerosis (ICD10-I70.0).   06/30/2019 Initial Biopsy   FINAL MICROSCOPIC DIAGNOSIS:   A. LIVER, BIOPSY:  - Adenocarcinoma consistent with metastatic colorectal adenocarcinoma.  - See comment.   COMMENT:  There is adenocarcinoma with necrosis and the morphologic features are  consistent with metastatic colorectal adenocarcinoma.  There is  sufficient tissue for additional testing.    06/30/2019 Genetic Testing   KRAS G13D mutation NRAS wildtype (codons 12, 13, 59, 61, 117 and 146 in exons 2, 3 and 4) MSI-Stable  Tumor mutational burden - 4Muts/Mb APC [id number] ERBB3 D297Y FAM123B E380 PIK3CA H1047R TP53 R213   Colorectal cancer, stage IV ([place]  07/03/2019 Initial Diagnosis   Colorectal cancer, stage IV ([facility])      CURRENT THERAPY:  PENDING Xeloda and Avastin  INTERVAL HISTORY:  Caleb DOMBEKis here for a follow up. He notes he has had a rough day due to fatigue and pulmonary HTN. He has met with dietician  today. He notes epigastric pain that is mild. BM help improve pain. He has been taking Laxative daily to reduce constipation.    REVIEW OF SYSTEMS:   Constitutional:  Denies fevers, chills or abnormal weight loss (+) Fatigue  Eyes: Denies blurriness of vision Ears, nose, mouth, throat, and face: Denies mucositis or sore throat Respiratory: Denies cough, dyspnea or wheezes (+) SOB  Cardiovascular: Denies palpitation, chest discomfort or lower extremity swelling Gastrointestinal:  Denies nausea, heartburn (+) Constipation (+) Epigastric pain  Skin: Denies abnormal skin rashes Lymphatics: Denies new lymphadenopathy or easy bruising Neurological:Denies numbness, tingling or new weaknesses Behavioral/Psych: Mood is stable, no new changes  All other systems were reviewed with the patient and are negative.  MEDICAL HISTORY:  Past Medical History:  Diagnosis Date  . Allergic rhinitis   . Atrial fibrillation ([place])   . Atypical pneumonia   . CAD (coronary artery disease)   . Cardiomyopathy, ischemic   . Chronic anticoagulation   . Cough   . Dizziness   . GERD (gastroesophageal reflux disease)   . Heart failure, systolic, acute on chronic ([place])   . Hyperlipidemia   . Hypertension   . OSA (obstructive sleep apnea)    Home sleep test 07/05/2009 AHI 8.2  . Pleural effusion   . Positional vertigo   . TIA (transient ischemic attack)     SURGICAL HISTORY: Past Surgical History:  Procedure Laterality Date  . AORTIC VALVE REPAIR  01/09/13  . CARDIAC CATHETERIZATION N/A 01/10/2016   Procedure: Right Heart Cath;  Surgeon: Larey Dresser, MD;  Location: [facility];  Service: Cardiovascular;  Laterality: N/A;  . CORONARY ARTERY BYPASS GRAFT  01/09/13   LAD LIMA, left atrial appendage  . CORONARY STENT PLACEMENT  2004   LAD  . MITRAL VALVE ANNULOPLASTY  01/09/13  . RIGHT HEART CATH N/A 11/16/2017   Procedure: RIGHT HEART CATH;  Surgeon: Larey Dresser, MD;  Location: [facility];  Service: Cardiovascular;  Laterality: N/A;  . RIGHT HEART CATHETERIZATION N/A 05/11/2014   Procedure: RIGHT HEART CATH;  Surgeon: Larey Dresser, MD;  Location[facility]  [facility] CATH LAB;  Service: Cardiovascular;  Laterality: N/A;  . SHOULDER SURGERY    . TEE WITHOUT CARDIOVERSION N/A 09/21/2012   Procedure: TRANSESOPHAGEAL ECHOCARDIOGRAM (TEE);  Surgeon: Larey Dresser, MD;  Location: [facility] ENDOSCOPY;  Service: Cardiovascular;  Laterality: N/A;    I have reviewed the social history and family history with the patient and they are unchanged from previous note.  ALLERGIES:  is allergic to carvedilol; codeine; opsumit [macitentan]; dabigatran; pradaxa [dabigatran etexilate mesylate]; sulfonamide derivatives; and xarelto [rivaroxaban].  MEDICATIONS:  Current Outpatient Medications  Medication Sig Dispense Refill  . ascorbic acid (VITAMIN C) 1000 MG tablet Take by mouth.    Marland Kitchen aspirin EC 81 MG tablet Take 1 tablet (81 mg total) by mouth daily. 30 tablet 3  . digoxin (LANOXIN) 0.125 MG tablet Take 1 tablet (0.125 mg total) by mouth daily. 90 tablet 3  . eplerenone (INSPRA) 25 MG tablet TAKE 1 TABLET BY MOUTH ONCE DAILY 30 tablet 11  . magnesium gluconate (MAGONATE) 500 MG tablet Take 500 mg by mouth 2 (two) times daily.     . metolazone (ZAROXOLYN) 2.5 MG tablet Take 2.5 mg by mouth as needed.     . Omega-3 Fatty Acids (FISH OIL PO) Take 360 mg by mouth daily.     . potassium chloride SA (KLOR-CON) 20 MEQ tablet Take 2.5 tablets (50 mEq total)  by mouth daily. 225 tablet 3  . sacubitril-valsartan (ENTRESTO) 24-26 MG Take 1 tablet by mouth 2 (two) times daily. 60 tablet 5  . torsemide (DEMADEX) 20 MG tablet Take 3 tablets (60 mg total) by mouth every morning AND 1 tablet (20 mg total) every evening. 540 tablet 2  . XELODA 500 MG tablet Take 3 tablets (1,500 mg total) by mouth 2 (two) times daily after a meal. Take for 7 days, then hold for 7 days. Repeat every 14 days. 84 tablet 0   No current facility-administered medications for this visit.    PHYSICAL EXAMINATION: ECOG PERFORMANCE STATUS: 2 - Symptomatic, <50% confined to bed  No vitals taken today, Exam not  performed today   LABORATORY DATA:  I have reviewed the data as listed CBC Latest Ref Rng & Units 07/31/2019 06/30/2019 06/23/2019  WBC 4.0 - 10.5 K/uL 6.3 7.2 7.2  Hemoglobin 13.0 - 17.0 g/dL 11.7(L) 12.8(L) 12.9(L)  Hematocrit 39.0 - 52.0 % 36.1(L) 39.7 40.2  Platelets 150 - 400 K/uL 264 261 265     CMP Latest Ref Rng & Units 07/31/2019 06/30/2019 06/23/2019  Glucose 70 - 99 mg/dL 93 100(H) 106(H)  BUN 8 - 23 mg/dL 21 26(H) 20  Creatinine 0.61 - 1.24 mg/dL 0.97 1.05 1.18  Sodium 135 - 145 mmol/L 132(L) 135 139  Potassium 3.5 - 5.1 mmol/L 4.1 4.3 3.9  Chloride 98 - 111 mmol/L 93(L) 94(L) 96(L)  CO2 22 - 32 mmol/L _0 Calcium 8.9 - 10.3 mg/dL 8.9 9.1 9.4  Total Protein 6.5 - 8.1 g/dL 7.4 8.2(H) 8.1  Total Bilirubin 0.3 - 1.2 mg/dL 0.7 1.2 1.0  Alkaline Phos 38 - 126 U/L 117 119 124  AST 15 - 41 U/L 71(H) 69(H) 56(H)  ALT 0 - 44 U/L _1 RADIOGRAPHIC STUDIES: I have personally reviewed the radiological images as listed and agreed with the findings in the report. No results found.   ASSESSMENT & PLAN:  Caleb [name] is a 79 y.o. male with    1.Stage IV colorectal cancer of the right hepatic flexure with metastatic adenopathy and extensive hepatic metastatic disease. -Initial CT scan on 06/19/2019 showedhighsuspicion forcolorectal cancer with a right hepatic flexure mass with metastatic adenopathy and extensive hepatic metastatic disease. -Patient at high risk for colonoscopy due to congestive heart failure (ejection fraction 10 to 15%09/2018). -Hisliverbiopsy from 06/30/19 showsAdenocarcinoma consistent with metastatic colorectal adenocarcinoma. -I discussed at this stage his cancer is not curable and I do not recommend surgery since he has no significant bleeding, bowel obstruction or high risk of perforation. I answered his questions about surgery  -Patient is also interested in liver targeted therapy.  He is not a candidate for liver ablation, I discussed the  option of embolization, but I do not recommend due to his untreated primary colon cancer and node metastasis. -I discussed standard treatment would include chemotherapy but due to his advanced age and significant comorbidities, especially congestive heart failure with EF 20%, he is not a candidate for intravenous chemotherapy.but I think low intensity chemo such as Xeloda will be an option.  -His FO results show he has KRAS mutation which means he is not eligible for target EGFR inhibitor treatments. He has MSI-Stable disease which means his cancer will not respond to immunotherapy alone such as Keytruda. I reviewed in great detail with him.  -I discussed there is clinical trail data of immunotherapy Nivolumab with oral biological agent Stivarga  being more beneficial in MSI-S disease. However, I would not recommend this as first line treatment given Nivo is not FDA approved for his cancer and we has less clinical data about this combination (phase 3 clinical is pending).  -I recommend first-line treatment with less intensive oral chemo Xeloda 1549m BID 1 week on/1 week off with IV biological agent Avastin q2 weeks. Goal of therapy is palliative,to prolong his life and improvehis quality of life and systemic treatment would be continued for as long as he tolerated and it is controlling his disease. I discussed with cardiologist Dr MAlgernon Huxleywho has cleared him.  pt is still considering treatment options, and has not decided about chemo. -I reviewed labs with him today. CBC and CMP WNL except Hg 11.7, sodium 132, albumin 3.2, AST 71. CEA has increased to 144.30. His potassium had normalized but he needs more protein. He has met with dietician today to review weight management.  -He notes he is still strongly considering Xeloda and Avastin treatment. He plans to have final decisions soon.  -f/u open, patient will call me as needed or when he is ready to start chemo   2. Constipation -Iencouraged the  patient to continue to use stool softeners and laxatives.The goal is to achieve a bowel movement daily.  -Ipreviouslydiscussed that some of his abdominal pain may be attributed to gas pain. -BM have been adequate with stool softener every 4-5 days. Continue.  -Dietician who he met today recommended high fiber in diet with high calorie and high protein diet.    3. Afib, CAD, CHFwith EF20%, HTN, S/p stroke -He is concerned about chemo treatment on his heart function. Will monitor heart function and BP on oral chemo and avastin if he chooses to proceed.  -He will continue to f/u with her cardiologist. I will discuss avastin use with his cardiologist.  -He is on ALimestoneand NEO40 which is a beet root extract that acts ad a vasodilator.  -He has small lung nodules as seen on 06/27/19 CT chest, will monitor on future scans.  4.Goal of care discussion  -The patient understands the goal of care is palliative. -I recommend DNR/DNI, he will think about it   PLAN: -Labs reviewed with him today  -He will continue to f/u with dietician  -F/u open, patient will call me for appointment   No problem-specific Assessment & Plan notes found for this encounter.   No orders of the defined types were placed in this encounter.  I discussed the assessment and treatment plan with the patient. The patient was provided an opportunity to ask questions and all were answered. The patient agreed with the plan and demonstrated an understanding of the instructions.  The patient was advised to call back or seek an in-person evaluation if the symptoms worsen or if the condition fails to improve as anticipated.  The total time spent in the appointment was 15 minutes.    YTruitt Merle MD 07/31/2019   I, AJoslyn Devon am acting as scribe for YTruitt Merle MD.   I have reviewed the above documentation for accuracy and completeness, and I agree with the above.

## 2019-07-31 ENCOUNTER — Other Ambulatory Visit: Payer: Self-pay

## 2019-07-31 ENCOUNTER — Other Ambulatory Visit: Payer: Medicare Other

## 2019-07-31 ENCOUNTER — Inpatient Hospital Stay (HOSPITAL_BASED_OUTPATIENT_CLINIC_OR_DEPARTMENT_OTHER): Payer: Medicare Other | Admitting: Hematology

## 2019-07-31 ENCOUNTER — Inpatient Hospital Stay: Payer: Medicare Other

## 2019-07-31 ENCOUNTER — Encounter: Payer: Self-pay | Admitting: Hematology

## 2019-07-31 ENCOUNTER — Inpatient Hospital Stay: Payer: Medicare Other | Attending: Physician Assistant | Admitting: Nutrition

## 2019-07-31 DIAGNOSIS — C19 Malignant neoplasm of rectosigmoid junction: Secondary | ICD-10-CM | POA: Diagnosis not present

## 2019-07-31 DIAGNOSIS — C787 Secondary malignant neoplasm of liver and intrahepatic bile duct: Secondary | ICD-10-CM | POA: Insufficient documentation

## 2019-07-31 DIAGNOSIS — Z8679 Personal history of other diseases of the circulatory system: Secondary | ICD-10-CM | POA: Diagnosis not present

## 2019-07-31 DIAGNOSIS — I4891 Unspecified atrial fibrillation: Secondary | ICD-10-CM | POA: Diagnosis not present

## 2019-07-31 DIAGNOSIS — E785 Hyperlipidemia, unspecified: Secondary | ICD-10-CM | POA: Diagnosis not present

## 2019-07-31 DIAGNOSIS — I272 Pulmonary hypertension, unspecified: Secondary | ICD-10-CM | POA: Diagnosis not present

## 2019-07-31 DIAGNOSIS — K219 Gastro-esophageal reflux disease without esophagitis: Secondary | ICD-10-CM | POA: Insufficient documentation

## 2019-07-31 DIAGNOSIS — Z8673 Personal history of transient ischemic attack (TIA), and cerebral infarction without residual deficits: Secondary | ICD-10-CM | POA: Insufficient documentation

## 2019-07-31 DIAGNOSIS — I255 Ischemic cardiomyopathy: Secondary | ICD-10-CM | POA: Diagnosis not present

## 2019-07-31 DIAGNOSIS — R590 Localized enlarged lymph nodes: Secondary | ICD-10-CM | POA: Diagnosis not present

## 2019-07-31 DIAGNOSIS — I110 Hypertensive heart disease with heart failure: Secondary | ICD-10-CM | POA: Diagnosis not present

## 2019-07-31 DIAGNOSIS — K5900 Constipation, unspecified: Secondary | ICD-10-CM | POA: Insufficient documentation

## 2019-07-31 DIAGNOSIS — I2510 Atherosclerotic heart disease of native coronary artery without angina pectoris: Secondary | ICD-10-CM | POA: Diagnosis not present

## 2019-07-31 DIAGNOSIS — I509 Heart failure, unspecified: Secondary | ICD-10-CM | POA: Diagnosis not present

## 2019-07-31 DIAGNOSIS — Z951 Presence of aortocoronary bypass graft: Secondary | ICD-10-CM | POA: Insufficient documentation

## 2019-07-31 LAB — CMP (CANCER CENTER ONLY)
ALT: 22 U/L (ref 0–44)
AST: 71 U/L — ABNORMAL HIGH (ref 15–41)
Albumin: 3.2 g/dL — ABNORMAL LOW (ref 3.5–5.0)
Alkaline Phosphatase: 117 U/L (ref 38–126)
Anion gap: 11 (ref 5–15)
BUN: 21 mg/dL (ref 8–23)
CO2: 28 mmol/L (ref 22–32)
Calcium: 8.9 mg/dL (ref 8.9–10.3)
Chloride: 93 mmol/L — ABNORMAL LOW (ref 98–111)
Creatinine: 0.97 mg/dL (ref 0.61–1.24)
GFR, Est AFR Am: >60 mL/min (ref >60)
GFR, Estimated: >60 mL/min (ref >60)
Glucose, Bld: 93 mg/dL (ref 70–99)
Potassium: 4.1 mmol/L (ref 3.5–5.1)
Sodium: 132 mmol/L — ABNORMAL LOW (ref 135–145)
Total Bilirubin: 0.7 mg/dL (ref 0.3–1.2)
Total Protein: 7.4 g/dL (ref 6.5–8.1)

## 2019-07-31 LAB — CBC WITH DIFFERENTIAL (CANCER CENTER ONLY)
Abs Immature Granulocytes: 0.03 10*3/uL (ref 0.00–0.07)
Basophils Absolute: 0 10*3/uL (ref 0.0–0.1)
Basophils Relative: 1 %
Eosinophils Absolute: 0.1 10*3/uL (ref 0.0–0.5)
Eosinophils Relative: 2 %
HCT: 36.1 % — ABNORMAL LOW (ref 39.0–52.0)
Hemoglobin: 11.7 g/dL — ABNORMAL LOW (ref 13.0–17.0)
Immature Granulocytes: 1 %
Lymphocytes Relative: 17 %
Lymphs Abs: 1.1 10*3/uL (ref 0.7–4.0)
MCH: 29.7 pg (ref 26.0–34.0)
MCHC: 32.4 g/dL (ref 30.0–36.0)
MCV: 91.6 fL (ref 80.0–100.0)
Monocytes Absolute: 0.6 10*3/uL (ref 0.1–1.0)
Monocytes Relative: 10 %
Neutro Abs: 4.4 10*3/uL (ref 1.7–7.7)
Neutrophils Relative %: 69 %
Platelet Count: 264 10*3/uL (ref 150–400)
RBC: 3.94 MIL/uL — ABNORMAL LOW (ref 4.22–5.81)
RDW: 14.3 % (ref 11.5–15.5)
WBC Count: 6.3 10*3/uL (ref 4.0–10.5)
nRBC: 0 % (ref 0.0–0.2)

## 2019-07-31 LAB — CEA (IN HOUSE-CHCC): CEA (CHCC-In House): 144.3 ng/mL — ABNORMAL HIGH (ref 0.00–5.00)

## 2019-07-31 NOTE — Progress Notes (Signed)
79 year old male diagnosed with colorectal cancer with metastases.  He is a patient of Dr. Burr Medico.  Past medical history includes CHF, atrial fibrillation, CAD, GERD, hyperlipidemia, hypertension, and OSA.  Medications include vitamin C, magnesium, and omega-3 fatty acids.  Labs include sodium 132 and albumin 3.2.  Height: 5 feet 10 inches. Weight: 141.1 pounds April 25. Usual body weight: About 150 pounds in December 2020. BMI: 20.25.  Patient reports he has decided not to have chemotherapy.   He is interested in some alternative treatments.  He plans to discuss with his MD today. He reports he is having some constipation which he attributes to dietary changes. He generally consumes fairly healthy diet with plant-based foods. He has been consuming a whey protein powder. He reports some taste alterations.  Nutrition diagnosis:  Food and nutrition related knowledge deficit related to cancer and associated treatments as evidenced by no prior need for nutrition related information.  Intervention: Provided education on importance of weight maintenance with adequate calories and protein.  Encourage patient to choose foods over supplements when possible. Discussed sugar and cancer, plant-based diet, protein shakes, vitamin mineral supplementation, and food preparation. Multiple questions were answered. Provided fact sheets and contact information.   I have encouraged patient to contact me with any questions.  Monitoring, evaluation, goals: Patient will tolerate adequate calories and protein for weight maintenance and improved quality of life.  Patient will call me with questions or concerns.  **Disclaimer: This note was dictated with voice recognition software. Similar sounding words can inadvertently be transcribed and this note may contain transcription errors which may not have been corrected upon publication of note.**

## 2019-08-07 ENCOUNTER — Telehealth: Payer: Self-pay | Admitting: Hematology

## 2019-08-07 MED FILL — EPLERENONE 25 MG TABS: 25 | 30 days supply | Qty: 30 | Fill #4

## 2019-08-07 NOTE — Telephone Encounter (Signed)
Patient called our GI navigator [place], and reported worsening fatigue and abdominal pain.  I called him back, the pain is more frequent, not unbearable, and he has not tried any pain medication such as Tylenol or ibuprofen.  He denies any fever, nausea, or constipation.  I encouraged him to try Tylenol first, if not enough, I will call in prescription pain medication.  Patient's son also called [place] regarding home oxygen need.  Patient has pulmonary hypertension, but has never used oxygen at home.  He has oxygen meter at home, and his finger pulse ox is around 97 to 98%.  I encouraged him to monitor his oxygen level when he walks around, see if drops below 90%.  I explained to him that home oxygen is only indicated when he has significant hypoxia, ie pulse ox below 88%.  He voiced good understanding, and will monitor his oxygen level at home.  Patient will call me if needed.  We will continue supportive care alone.  I previously recommended palliative home care and he declined.  He appreciated the call.  Truitt Merle  08/07/2019

## 2019-08-11 NOTE — Progress Notes (Signed)
  This encounter was created in error - please disregard.  Nurse Note: Patient declined services

## 2019-08-14 ENCOUNTER — Emergency Department (HOSPITAL_COMMUNITY): Payer: Medicare Other

## 2019-08-14 ENCOUNTER — Encounter (HOSPITAL_COMMUNITY): Payer: Self-pay

## 2019-08-14 ENCOUNTER — Other Ambulatory Visit: Payer: Self-pay

## 2019-08-14 ENCOUNTER — Emergency Department (HOSPITAL_COMMUNITY)
Admission: EM | Admit: 2019-08-14 | Discharge: 2019-08-14 | Disposition: A | Discharge status: Home / Self Care | Payer: Medicare Other | Attending: Emergency Medicine | Admitting: Emergency Medicine

## 2019-08-14 DIAGNOSIS — R0902 Hypoxemia: Secondary | ICD-10-CM | POA: Diagnosis not present

## 2019-08-14 DIAGNOSIS — Z7982 Long term (current) use of aspirin: Secondary | ICD-10-CM | POA: Diagnosis not present

## 2019-08-14 DIAGNOSIS — Z955 Presence of coronary angioplasty implant and graft: Secondary | ICD-10-CM | POA: Diagnosis not present

## 2019-08-14 DIAGNOSIS — I509 Heart failure, unspecified: Secondary | ICD-10-CM | POA: Diagnosis not present

## 2019-08-14 DIAGNOSIS — R0602 Shortness of breath: Secondary | ICD-10-CM | POA: Diagnosis not present

## 2019-08-14 DIAGNOSIS — I5022 Chronic systolic (congestive) heart failure: Secondary | ICD-10-CM | POA: Diagnosis not present

## 2019-08-14 DIAGNOSIS — R42 Dizziness and giddiness: Secondary | ICD-10-CM | POA: Diagnosis not present

## 2019-08-14 DIAGNOSIS — I2510 Atherosclerotic heart disease of native coronary artery without angina pectoris: Secondary | ICD-10-CM | POA: Insufficient documentation

## 2019-08-14 DIAGNOSIS — Z951 Presence of aortocoronary bypass graft: Secondary | ICD-10-CM | POA: Diagnosis not present

## 2019-08-14 DIAGNOSIS — Z79899 Other long term (current) drug therapy: Secondary | ICD-10-CM | POA: Diagnosis not present

## 2019-08-14 DIAGNOSIS — C787 Secondary malignant neoplasm of liver and intrahepatic bile duct: Secondary | ICD-10-CM | POA: Insufficient documentation

## 2019-08-14 DIAGNOSIS — I4510 Unspecified right bundle-branch block: Secondary | ICD-10-CM | POA: Diagnosis not present

## 2019-08-14 DIAGNOSIS — I959 Hypotension, unspecified: Secondary | ICD-10-CM | POA: Diagnosis not present

## 2019-08-14 DIAGNOSIS — I110 Hypertensive heart disease with heart failure: Secondary | ICD-10-CM | POA: Diagnosis not present

## 2019-08-14 DIAGNOSIS — Z85038 Personal history of other malignant neoplasm of large intestine: Secondary | ICD-10-CM | POA: Insufficient documentation

## 2019-08-14 DIAGNOSIS — I5023 Acute on chronic systolic (congestive) heart failure: Secondary | ICD-10-CM | POA: Diagnosis not present

## 2019-08-14 LAB — CBC WITH DIFFERENTIAL/PLATELET
Abs Immature Granulocytes: 0.02 10*3/uL (ref 0.00–0.07)
Basophils Absolute: 0 10*3/uL (ref 0.0–0.1)
Basophils Relative: 1 %
Eosinophils Absolute: 0.1 10*3/uL (ref 0.0–0.5)
Eosinophils Relative: 1 %
HCT: 30.3 % — ABNORMAL LOW (ref 39.0–52.0)
Hemoglobin: 10 g/dL — ABNORMAL LOW (ref 13.0–17.0)
Immature Granulocytes: 0 %
Lymphocytes Relative: 19 %
Lymphs Abs: 1.2 10*3/uL (ref 0.7–4.0)
MCH: 29.9 pg (ref 26.0–34.0)
MCHC: 33 g/dL (ref 30.0–36.0)
MCV: 90.7 fL (ref 80.0–100.0)
Monocytes Absolute: 0.5 10*3/uL (ref 0.1–1.0)
Monocytes Relative: 9 %
Neutro Abs: 4.4 10*3/uL (ref 1.7–7.7)
Neutrophils Relative %: 70 %
Platelets: 298 10*3/uL (ref 150–400)
RBC: 3.34 MIL/uL — ABNORMAL LOW (ref 4.22–5.81)
RDW: 14.8 % (ref 11.5–15.5)
WBC: 6.3 10*3/uL (ref 4.0–10.5)
nRBC: 0 % (ref 0.0–0.2)

## 2019-08-14 LAB — RETICULOCYTES
Immature Retic Fract: 11.3 % (ref 2.3–15.9)
RBC.: 3.54 MIL/uL — ABNORMAL LOW (ref 4.22–5.81)
Retic Count, Absolute: 62.3 10*3/uL (ref 19.0–186.0)
Retic Ct Pct: 1.8 % (ref 0.4–3.1)

## 2019-08-14 LAB — IRON AND TIBC
Iron: 41 ug/dL — ABNORMAL LOW (ref 45–182)
Saturation Ratios: 14 % — ABNORMAL LOW (ref 17.9–39.5)
TIBC: 283 ug/dL (ref 250–450)
UIBC: 242 ug/dL

## 2019-08-14 LAB — BASIC METABOLIC PANEL
Anion gap: 13 (ref 5–15)
BUN: 19 mg/dL (ref 8–23)
CO2: 26 mmol/L (ref 22–32)
Calcium: 8.7 mg/dL — ABNORMAL LOW (ref 8.9–10.3)
Chloride: 93 mmol/L — ABNORMAL LOW (ref 98–111)
Creatinine, Ser: 1 mg/dL (ref 0.61–1.24)
GFR calc Af Amer: >60 mL/min (ref >60)
GFR calc non Af Amer: >60 mL/min (ref >60)
Glucose, Bld: 97 mg/dL (ref 70–99)
Potassium: 3.9 mmol/L (ref 3.5–5.1)
Sodium: 132 mmol/L — ABNORMAL LOW (ref 135–145)

## 2019-08-14 LAB — POC OCCULT BLOOD, ED: Fecal Occult Bld: POSITIVE — AB

## 2019-08-14 LAB — TROPONIN I (HIGH SENSITIVITY)
Troponin I (High Sensitivity): 30 ng/L — ABNORMAL HIGH (ref <18)
Troponin I (High Sensitivity): 32 ng/L — ABNORMAL HIGH (ref <18)

## 2019-08-14 LAB — FERRITIN: Ferritin: 105 ng/mL (ref 24–336)

## 2019-08-14 LAB — FOLATE: Folate: 23 ng/mL (ref >5.9)

## 2019-08-14 LAB — VITAMIN B12: Vitamin B-12: 1311 pg/mL — ABNORMAL HIGH (ref 180–914)

## 2019-08-14 LAB — BRAIN NATRIURETIC PEPTIDE: B Natriuretic Peptide: 969.1 pg/mL — ABNORMAL HIGH (ref 0.0–100.0)

## 2019-08-14 LAB — DIGOXIN LEVEL: Digoxin Level: 0.6 ng/mL — ABNORMAL LOW (ref 0.8–2.0)

## 2019-08-14 MED ORDER — FUROSEMIDE 10 MG/ML IJ SOLN
80.0000 mg | Freq: Once | INTRAMUSCULAR | Status: AC
  Administered 2019-08-14: 80 mg via INTRAVENOUS
  Filled 2019-08-14: qty 8

## 2019-08-14 MED ORDER — FUROSEMIDE 10 MG/ML IJ SOLN: 60.0000 mg | Freq: Once | INTRAMUSCULAR | Status: DC

## 2019-08-14 NOTE — ED Triage Notes (Signed)
Pt from home, BIB [facility] for increasing SOB since 11 am and increased swelling in the L leg predominantly over the last few days, increasing furosemide at home. Recent colon cancer diagnosis with liver involvement. Lightheaded while working on the yard, not orthostatic for [employer]. 12 lead showed R bundle branch block and Afib (hx of Afib).

## 2019-08-14 NOTE — Consult Note (Addendum)
Advanced Heart Failure Team Consult Note   Primary Physician: Caleb Koch, MD PCP-Cardiologist:  No primary care provider on file.  Reason for Consultation: A/C   HPI:    Caleb [name] is seen today for evaluation of heart failure at the request of Caleb [name].   Caleb [name] is an 79 year old with a history of PAH dating back to 2017,  CAD, CABG with LIMA-LAD, LA appendage closure, PAH, OSA, A fib, and chronic systolic heart failure. Recently diagnosed with metastatic colon disease.   He has been followed closely in the HF clinic. Weight has gone up from 140-->145 pounds. Progressive fatigue., leg edema, and shortness of breath. Nonproductive cough. He has increased his torsemide to 60 mg/40 mg with poor response. Complaining of constipation. Denies BRBPR.   Earlier this morning he was acutely short of breath with exertion and called 911. Appetite has been ok. Denies fever or chills. Wants to try and manage this at home.   ED course with pertinent lab/imaging showed hgb down to 11, BNP 969, HS trop 969. WBC 6.3, and creatinine 1. CXR with no acute findings.   Echo 09/2018 EF 10-15%, RV mildly reduced.    Review of Systems: [y] = yes, _0  = no   . General: Weight gain [Y ]; Weight loss _1 ; Anorexia _2 ; Fatigue _3 ; Fever _4 ; Chills _5 ; Weakness [Y ]  . Cardiac: Chest pain/pressure _6 ; Resting SOB _7 ; Exertional SOB [ Y]; Orthopnea [ Y]; Pedal Edema _8 ; Palpitations _9 ; Syncope _10 ; Presyncope _11 ; Paroxysmal nocturnal dyspnea_12   . Pulmonary: Cough _13 ; Wheezing_14 ; Hemoptysis_15 ; Sputum _16 ; Snoring _17   . GI: Vomiting_18 ; Dysphagia_19 ; Melena_20 ; Hematochezia _21 ; Heartburn_22 ; Abdominal pain _23 ; Constipation [ Y]; Diarrhea _24 ; BRBPR _25   . GU: Hematuria_26 ; Dysuria _27 ; Nocturia_28   . Vascular: Pain in legs with walking _29 ; Pain in feet with lying flat _30 ; Non-healing sores _31 ; Stroke _32 ; TIA _33 ; Slurred speech _34 ;  . Neuro: Headaches_35 ; Vertigo_36 ;  Seizures_37 ; Paresthesias_38 ;Blurred vision _39 ; Diplopia _40 ; Vision changes _41   . Ortho/Skin: Arthritis _42 ; Joint pain [ Y]; Muscle pain _43 ; Joint swelling _44 ; Back Pain [ Y]; Rash _45   . Psych: Depression_46 ; Anxiety_47   . Heme: Bleeding problems _48 ; Clotting disorders _49 ; Anemia [Y ]  . Endocrine: Diabetes _50 ; Thyroid dysfunction_51   Home Medications Prior to Admission medications   Medication Sig Start Date End Date Taking? Authorizing Provider  ascorbic acid (VITAMIN C) 1000 MG tablet Take 1,000 mg by mouth daily.  01/17/13  Yes [provider]  digoxin (LANOXIN) 0.125 MG tablet Take 1 tablet (0.125 mg total) by mouth daily. 03/02/19  Yes Caleb Dresser, MD  eplerenone (INSPRA) 25 MG tablet TAKE 1 TABLET BY MOUTH ONCE DAILY Patient taking differently: Take 25 mg by mouth daily.  04/10/19  Yes Caleb Dresser, MD  magnesium gluconate (MAGONATE) 500 MG tablet Take 500 mg by mouth 2 (two) times daily.    Yes [provider]  metolazone (ZAROXOLYN) 2.5 MG tablet Take 2.5 mg by mouth as needed (fluid retention.).    Yes [provider]  Omega-3 Fatty Acids (FISH OIL PO) Take 360 mg by mouth every other day.    Yes [provider]  potassium chloride SA (KLOR-CON) 20 MEQ tablet Take 2.5 tablets (50 mEq total) by mouth daily. Patient taking differently: Take 40 mEq by mouth as needed (Potassium levels.).  07/28/19  Yes Caleb Dresser, MD  sacubitril-valsartan (ENTRESTO) 24-26 MG Take 1 tablet by mouth 2 (two) times daily. 03/02/19  Yes Caleb Dresser, MD  torsemide (DEMADEX) 20 MG tablet Take 3 tablets (60 mg total) by mouth every morning AND 1 tablet (20 mg total) every evening. 04/03/19  Yes Caleb Dresser, MD  aspirin EC 81 MG tablet Take 1 tablet (81 mg total) by mouth daily. Patient not taking: Reported on 08/14/2019 12/14/17   Caleb Dresser, MD  XELODA 500 MG tablet Take 3 tablets (1,500 mg total) by mouth 2 (two) times daily after a meal.  Take for 7 days, then hold for 7 days. Repeat every 14 days. Patient not taking: Reported on 08/14/2019 07/06/19   Caleb Merle, MD    Past Medical History: Past Medical History:  Diagnosis Date  . Allergic rhinitis   . Atrial fibrillation ([place])   . Atypical pneumonia   . CAD (coronary artery disease)   . Cardiomyopathy, ischemic   . Chronic anticoagulation   . Cough   . Dizziness   . GERD (gastroesophageal reflux disease)   . Heart failure, systolic, acute on chronic ([place])   . Hyperlipidemia   . Hypertension   . OSA (obstructive sleep apnea)    Home sleep test 07/05/2009 AHI 8.2  . Pleural effusion   . Positional vertigo   . TIA (transient ischemic attack)     Past Surgical History: Past Surgical History:  Procedure Laterality Date  . AORTIC VALVE REPAIR  01/09/13  . CARDIAC CATHETERIZATION N/A 01/10/2016   Procedure: Right Heart Cath;  Surgeon: Caleb Dresser, MD;  Location: [facility];  Service: Cardiovascular;  Laterality: N/A;  . CORONARY ARTERY BYPASS GRAFT  01/09/13   LAD LIMA, left atrial appendage  . CORONARY STENT PLACEMENT  2004   LAD  . MITRAL VALVE ANNULOPLASTY  01/09/13  . RIGHT HEART CATH N/A 11/16/2017   Procedure: RIGHT HEART CATH;  Surgeon: Caleb Dresser, MD;  Location: [facility];  Service: Cardiovascular;  Laterality: N/A;  . RIGHT HEART CATHETERIZATION N/A 05/11/2014   Procedure: RIGHT HEART CATH;  Surgeon: Caleb Dresser, MD;  Location: [facility] C[facility] LAB;  Service: Cardiovascular;  Laterality: N/A;  . SHOULDER SURGERY    . TEE WITHOUT CARDIOVERSION N/A 09/21/2012   Procedure: TRANSESOPHAGEAL ECHOCARDIOGRAM (TEE);  Surgeon: Caleb Dresser, MD;  Location: [facility] ENDOSCOPY;  Service: Cardiovascular;  Laterality: N/A;    Family History: Family History  Adopted: Yes  Problem Relation Age of Onset  . Diabetes Other        [place]  . Heart disease Neg Hx   . Hypertension Neg Hx     Social History: Social History   Socioeconomic History  .  Marital status: Married    Spouse name: Not on file  . Number of children: Not on file  . Years of education: Not on file  . Highest education level: Not on file  Occupational History  . Occupation: PHYSICIAN    Employer: [employer]    Comment: Psychologist  Tobacco Use  . Smoking status: Never Smoker  . Smokeless tobacco: Never Used  Substance and Sexual Activity  . Alcohol use: No  . Drug use: No  . Sexual activity: Not on file  Other Topics  Concern  . Not on file  Social History Narrative   Married. Wife used to see Caleb. Arnoldo Morale. He saw Caleb. Sherren Mocha. 2 sons- 1 is anesthesiologist. 2 grandsons.       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 [name].       Hobbies:  Model train, reading, walking in woods, prior fishing, prior golfing   Social Determinants of Health   Financial Resource Strain:   . Difficulty of Paying Living Expenses:   Food Insecurity:   . Worried About Charity fundraiser in the Last Year:   . Arboriculturist in the Last Year:   Transportation Needs:   . Film/video editor (Medical):   Marland Kitchen Lack of Transportation (Non-Medical):   Physical Activity:   . Days of Exercise per Week:   . Minutes of Exercise per Session:   Stress:   . Feeling of Stress :   Social Connections:   . Frequency of Communication with Friends and Family:   . Frequency of Social Gatherings with Friends and Family:   . Attends Religious Services:   . Active Member of Clubs or Organizations:   . Attends Archivist Meetings:   Marland Kitchen Marital Status:     Allergies:  Allergies  Allergen Reactions  . Carvedilol Other (See Comments)    Dizziness, uneasiness, alopecia  . Codeine     Rapid thoughts, hallucinations  . Opsumit [Macitentan] Swelling    Per patient, caused extreme ankle swelling.   . Dabigatran Rash  . Pradaxa [Dabigatran Etexilate Mesylate] Rash  . Sulfonamide Derivatives Rash  . Xarelto  [Rivaroxaban] Rash    Objective:    Vital Signs:   Temp:  [98 F (36.7 C)] 98 F (36.7 C) (05/24 1434) Pulse Rate:  [77] 77 (05/24 1421) Resp:  [15] 15 (05/24 1421) BP: (128)/(46) 128/46 (05/24 1421) SpO2:  [100 %] 100 % (05/24 1421) Weight:  [63.5 kg] 63.5 kg (05/24 1435)    Weight change: Filed Weights   08/14/19 1435  Weight: 63.5 kg    Intake/Output:  No intake or output data in the 24 hours ending 08/14/19 1608    Physical Exam    General:  Pale. No resp difficulty HEENT: normal Neck: supple. JVP to jaw  . Carotids 2+ bilat; no bruits. No lymphadenopathy or thyromegaly appreciated. Cor: PMI nondisplaced. Irregular rate & rhythm. No rubs, gallops or murmurs. Lungs: clear Abdomen: soft, nontender, nondistended. No hepatosplenomegaly. No bruits or masses. Good bowel sounds. Extremities: no cyanosis, clubbing, rash,  R and LLE 2+ edema Neuro: alert & orientedx3, cranial nerves grossly intact. moves all 4 extremities w/o difficulty. Affect pleasant   Telemetry   A fib 80s   EKG    A fib 81 bpm   Labs   Basic Metabolic Panel: Recent Labs  Lab 08/14/19 1450  NA 132*  K 3.9  CL 93*  CO2 26  GLUCOSE 97  BUN 19  CREATININE 1.00  CALCIUM 8.7*    Liver Function Tests: No results for input(s): AST, ALT, ALKPHOS, BILITOT, PROT, ALBUMIN in the last 168 hours. No results for input(s): LIPASE, AMYLASE in the last 168 hours. No results for input(s): AMMONIA in the last 168 hours.  CBC: Recent Labs  Lab 08/14/19 1450  WBC 6.3  NEUTROABS 4.4  HGB 10.0*  HCT 30.3*  MCV 90.7  PLT 298    Cardiac Enzymes: No results for input(s): CKTOTAL, CKMB, CKMBINDEX, TROPONINI  in the last 168 hours.  BNP: BNP (last 3 results) Recent Labs    12/16/18 1600 01/16/19 1537 04/25/19 1448  BNP 578.8* 488.9* 567.8*    ProBNP (last 3 results) No results for input(s): PROBNP in the last 8760 hours.   CBG: No results for input(s): GLUCAP in the last 168  hours.  Coagulation Studies: No results for input(s): LABPROT, INR in the last 72 hours.   Imaging   DG Chest Port 1 View  Result Date: 08/14/2019 CLINICAL DATA:  Bilateral feet swelling x3 days, Increasing SOB over last 3 days. Hx Pulm HTN, diabetes, CHF, PAD, MI (17 yrs ago), and A-Fib. EXAM: PORTABLE CHEST 1 VIEW COMPARISON:  Chest radiograph in 05/30/2018, 02/24/2018 FINDINGS: Stable cardiomediastinal contours with enlarged heart size. Surgical changes status post median sternotomy and valvular replacement. The lungs are hyperinflated. The lungs are otherwise clear. No pneumothorax or significant pleural effusion. No acute finding in the visualized skeleton. IMPRESSION: No acute cardiopulmonary process. Electronically Signed   By: Audie Pinto M.D.   On: 08/14/2019 14:59      Medications:     Current Medications:    Infusions:     Assessment/Plan   1.A/C Systolic Heart Failure - ECHO 09/2018 EF 10-15%  Volume overloaded. Give 60 mg IV lasix now. If hes a good response he can be discharged to home with [facility] for IV lasix daily for 2 more days.  - No torsemide for the 2 days then on Thursday start torsemide 80 mg daily.  - Renal function stable.  - Ask [facility] to place unna boots.  - No bb with h/o brady cardia   2. Metastatic Cancer Followed by Caleb [name]  3. Anemia Hgb trending down. Check anemia panel.  - If iron sats low can give feraheme in the outpatient setting.   4.  A fib  s/p Maze in 10/14, then ablation in 3/15.  Prior to Maze, he was on Tikosyn but had breakthrough.  Currently, he is not anticoagulated due to history of prostate bleeding and his choice.  He did have his LA appendage oversewn at time of MV surgery.   1% atrial fibrillation on 4/19 event monitor.  Zio patch in 6/20 with short SVT runs (possible atrial fibrillation).  I think that he is in NSR vs ectopic atrial rhythm today with low voltage P waves.  - He is off bisoprolol with  bradycardia.    - Caleb [name] discussions about what to do with his atrial fibrillation/flutter.  He is symptomatic at times when in atrial fibrillation/fluttter. He opted to hold off on trying Tikosyn again and currently feels like the atrial fibrillation/flutter burden is manageable.   Currently in A fib with controlled rate.  - No anti-coagulation with history of rectal bleeding.  -Referred to [facility].  -Give 80 mg IV lasi 5/25 and 5/26.  - On 5/27 start torsemide 80 mg daily.  -We will set up follow up in HF clinic.     Length of Stay: 0  Caleb Grinder, NP  08/14/2019, 4:08 PM  Advanced Heart Failure Team Pager [phone] (M-F; 7a - 4p)  Please contact Caleb [name] for night-coverage after hours (4p -7a ) and weekends on [email or web address]  Patient seen and examined with the above-signed Advanced Practice Provider and/or Housestaff. I personally reviewed laboratory data, imaging studies and relevant notes. I independently examined the patient and formulated the important aspects of the plan. I have edited the note to reflect any of my  changes or salient points. I have personally discussed the plan with the patient and/or family.  79 y/o male as above with PAH, chronic systolic HF EF 78-58%, CAD s/p CABG , persistent AF and metastatic colon CA. Presents to ER with several day h/o progressive HF symptoms with SOB and LE edema.   General:  Elderly frail male No resp difficulty HEENT: normal Neck: supple. JVP to ear. Carotids 2+ bilat; no bruits. No lymphadenopathy or thryomegaly appreciated. Cor: PMI nondisplaced. Irregular rate & rhythm. No rubs, gallops or murmurs. Lungs: clear Abdomen: soft, nontender, nondistended. No hepatosplenomegaly. No bruits or masses. Good bowel sounds. Extremities: no cyanosis, clubbing, rash, 2-3+ edema Neuro: alert & orientedx3, cranial nerves grossly intact. moves all 4 extremities w/o difficulty. Affect pleasant  He has moderate fluid overload but stable from  a respiratory perspective. Will give one dose of IV lasix here and if he has good response will d/c to [facility] for at least 2 days of IV diuretics and LE wrapping.   D/w Caleb. Aundra [name] as well as Caleb. Tarri [name] from [place] [facility].   Caleb Bickers, MD  5:49 PM

## 2019-08-14 NOTE — ED Provider Notes (Signed)
[facility] EMERGENCY DEPARTMENT Provider Note   CSN: [id number] Arrival date & time: 08/14/19  1409     History No chief complaint on file.   Caleb [name] is a 79 y.o. male.  He has a complicated medical history including CABG, A. fib, pulmonary hypertension, congestive heart failure.  He was recently diagnosed with metastatic colon cancer and is on oral chemo.  He has had increased shortness of breath over the past few weeks.  Constant although worse with any type of activity.  Today he felt lightheaded.  He has been increasing his torsemide at home.  Increased swelling in his lower extremities left greater than right.  No chest pain no abdominal pain.  Cough not productive of any sputum.  No fevers or chills.  The history is provided by the patient.  Shortness of Breath Severity:  Moderate Onset quality:  Gradual Timing:  Constant Progression:  Worsening Chronicity:  New Context: activity   Relieved by:  Nothing Worsened by:  Activity and coughing Ineffective treatments:  Diuretics Associated symptoms: cough   Associated symptoms: no abdominal pain, no chest pain, no fever, no headaches, no hemoptysis, no neck pain, no rash, no sore throat, no sputum production and no vomiting   Risk factors: hx of cancer        Past Medical History:  Diagnosis Date  . Allergic rhinitis   . Atrial fibrillation ([place])   . Atypical pneumonia   . CAD (coronary artery disease)   . Cardiomyopathy, ischemic   . Chronic anticoagulation   . Cough   . Dizziness   . GERD (gastroesophageal reflux disease)   . Heart failure, systolic, acute on chronic ([place])   . Hyperlipidemia   . Hypertension   . OSA (obstructive sleep apnea)    Home sleep test 07/05/2009 AHI 8.2  . Pleural effusion   . Positional vertigo   . TIA (transient ischemic attack)     Patient Active Problem List   Diagnosis Date Noted  . Colorectal cancer, stage IV ([place]) 07/03/2019  . Goals of care,  counseling/discussion 06/23/2019  . Constipation 06/23/2019  . Metastatic carcinoma to liver ([place]) 06/23/2019  . Abdominal swelling 05/26/2019  . Osteoarthritis 01/01/2017  . Episodic lightheadedness 09/03/2016  . Intractable hiccups 06/10/2015  . Pulmonary hypertension ([place]) 05/24/2014  . Gross hematuria 02/07/2014  . Age-related macular degeneration, dry 08/06/2013  . S/P aortic valve repair 02/09/2013  . S/P MVR (mitral valve repair) 02/09/2013  . S/P CABG x 1 02/09/2013  . Cardiomyopathy ([place]) 01/06/2013  . H/O shoulder surgery 01/06/2013  . Dyspnea on exertion 11/25/2012  . OSA (obstructive sleep apnea) 07/13/2012  . Anxiety 01/26/2011  . Chronic systolic heart failure ([place]) 09/11/2010  . PSA, INCREASED 12/17/2008  . SINUS BRADYCARDIA 08/15/2008  . POSITIONAL VERTIGO 07/13/2008  . GERD 03/11/2008  . Hyperlipidemia 03/07/2008  . Essential hypertension 03/07/2008  . Coronary atherosclerosis 03/07/2008  . ALLERGIC RHINITIS 03/07/2008  . ATRIAL FIBRILLATION 02/14/2007    Past Surgical History:  Procedure Laterality Date  . AORTIC VALVE REPAIR  01/09/13  . CARDIAC CATHETERIZATION N/A 01/10/2016   Procedure: Right Heart Cath;  Surgeon: Larey Dresser, MD;  Location: [place] [facility];  Service: Cardiovascular;  Laterality: N/A;  . CORONARY ARTERY BYPASS GRAFT  01/09/13   LAD LIMA, left atrial appendage  . CORONARY STENT PLACEMENT  2004   LAD  . MITRAL VALVE ANNULOPLASTY  01/09/13  . RIGHT HEART CATH N/A 11/16/2017   Procedure: RIGHT  HEART CATH;  Surgeon: Larey Dresser, MD;  Location: [place] [facility];  Service: Cardiovascular;  Laterality: N/A;  . RIGHT HEART CATHETERIZATION N/A 05/11/2014   Procedure: RIGHT HEART CATH;  Surgeon: Larey Dresser, MD;  Location: [facility] CATH LAB;  Service: Cardiovascular;  Laterality: N/A;  . SHOULDER SURGERY    . TEE WITHOUT CARDIOVERSION N/A 09/21/2012   Procedure: TRANSESOPHAGEAL ECHOCARDIOGRAM (TEE);  Surgeon: Larey Dresser, MD;   Location: [facility] ENDOSCOPY;  Service: Cardiovascular;  Laterality: N/A;       Family History  Adopted: Yes  Problem Relation Age of Onset  . Diabetes Other        [place]  . Heart disease Neg Hx   . Hypertension Neg Hx     Social History   Tobacco Use  . Smoking status: Never Smoker  . Smokeless tobacco: Never Used  Substance Use Topics  . Alcohol use: No  . Drug use: No    Home Medications Prior to Admission medications   Medication Sig Start Date End Date Taking? Authorizing Provider  ascorbic acid (VITAMIN C) 1000 MG tablet Take by mouth. 01/17/13   [provider]  aspirin EC 81 MG tablet Take 1 tablet (81 mg total) by mouth daily. 12/14/17   Larey Dresser, MD  digoxin (LANOXIN) 0.125 MG tablet Take 1 tablet (0.125 mg total) by mouth daily. 03/02/19   Larey Dresser, MD  eplerenone (INSPRA) 25 MG tablet TAKE 1 TABLET BY MOUTH ONCE DAILY 04/10/19   Larey Dresser, MD  magnesium gluconate (MAGONATE) 500 MG tablet Take 500 mg by mouth 2 (two) times daily.     [provider]  metolazone (ZAROXOLYN) 2.5 MG tablet Take 2.5 mg by mouth as needed.     [provider]  Omega-3 Fatty Acids (FISH OIL PO) Take 360 mg by mouth daily.     [provider]  potassium chloride SA (KLOR-CON) 20 MEQ tablet Take 2.5 tablets (50 mEq total) by mouth daily. 07/28/19   Larey Dresser, MD  sacubitril-valsartan (ENTRESTO) 24-26 MG Take 1 tablet by mouth 2 (two) times daily. 03/02/19   Larey Dresser, MD  torsemide (DEMADEX) 20 MG tablet Take 3 tablets (60 mg total) by mouth every morning AND 1 tablet (20 mg total) every evening. 04/03/19   Larey Dresser, MD  XELODA 500 MG tablet Take 3 tablets (1,500 mg total) by mouth 2 (two) times daily after a meal. Take for 7 days, then hold for 7 days. Repeat every 14 days. 07/06/19   Truitt Merle, MD    Allergies    Carvedilol, Codeine, Opsumit [macitentan], Dabigatran, Pradaxa [dabigatran etexilate mesylate], Sulfonamide  derivatives, and Xarelto [rivaroxaban]  Review of Systems   Review of Systems  Constitutional: Negative for fever.  HENT: Negative for sore throat.   Eyes: Negative for visual disturbance.  Respiratory: Positive for cough and shortness of breath. Negative for hemoptysis and sputum production.   Cardiovascular: Positive for leg swelling. Negative for chest pain.  Gastrointestinal: Negative for abdominal pain and vomiting.  Genitourinary: Negative for dysuria.  Musculoskeletal: Negative for neck pain.  Skin: Positive for wound. Negative for rash.  Neurological: Negative for headaches.    Physical Exam Updated Vital Signs BP (!) 128/46   Pulse 77   Resp 15   SpO2 100%   Physical Exam Vitals and nursing note reviewed.  Constitutional:      Appearance: He is well-developed.  HENT:     Head: Normocephalic  and atraumatic.  Eyes:     Conjunctiva/sclera: Conjunctivae normal.  Cardiovascular:     Rate and Rhythm: Normal rate. Rhythm irregular.     Pulses: Normal pulses.     Heart sounds: No murmur.  Pulmonary:     Effort: Tachypnea and accessory muscle usage present. No respiratory distress.     Breath sounds: Normal breath sounds.  Abdominal:     Palpations: Abdomen is soft.     Tenderness: There is no abdominal tenderness.  Musculoskeletal:        General: Signs of injury present. No deformity.     Cervical back: Neck supple.     Right lower leg: Edema present.     Left lower leg: Edema present.     Comments: Small skin tear left elbow  Skin:    General: Skin is warm and dry.     Capillary Refill: Capillary refill takes less than 2 seconds.  Neurological:     General: No focal deficit present.     Mental Status: He is alert and oriented to person, place, and time.     ED Results / Procedures / Treatments   Labs (all labs ordered are listed, but only abnormal results are displayed) Labs Reviewed  BASIC METABOLIC PANEL - Abnormal; Notable for the following  components:      Result Value   Sodium 132 (*)    Chloride 93 (*)    Calcium 8.7 (*)    All other components within normal limits  CBC WITH DIFFERENTIAL/PLATELET - Abnormal; Notable for the following components:   RBC 3.34 (*)    Hemoglobin 10.0 (*)    HCT 30.3 (*)    All other components within normal limits  BRAIN NATRIURETIC PEPTIDE - Abnormal; Notable for the following components:   B Natriuretic Peptide 969.1 (*)    All other components within normal limits  DIGOXIN LEVEL - Abnormal; Notable for the following components:   Digoxin Level 0.6 (*)    All other components within normal limits  VITAMIN B12 - Abnormal; Notable for the following components:   Vitamin B-12 1,311 (*)    All other components within normal limits  IRON AND TIBC - Abnormal; Notable for the following components:   Iron 41 (*)    Saturation Ratios 14 (*)    All other components within normal limits  RETICULOCYTES - Abnormal; Notable for the following components:   RBC. 3.54 (*)    All other components within normal limits  POC OCCULT BLOOD, ED - Abnormal; Notable for the following components:   Fecal Occult Bld POSITIVE (*)    All other components within normal limits  TROPONIN I (HIGH SENSITIVITY) - Abnormal; Notable for the following components:   Troponin I (High Sensitivity) 30 (*)    All other components within normal limits  TROPONIN I (HIGH SENSITIVITY) - Abnormal; Notable for the following components:   Troponin I (High Sensitivity) 32 (*)    All other components within normal limits  FOLATE  FERRITIN    EKG EKG Interpretation  Date/Time:  Monday Aug 14 2019 14:21:31 EDT Ventricular Rate:  81 PR Interval:    QRS Duration: 146 QT Interval:  481 QTC Calculation: 559 R Axis:   -63 Text Interpretation: Atrial flutter/fibrillation IVCD, consider atypical RBBB LVH with IVCD and secondary repol abnrm Prolonged QT interval similar to prior 4/21 Confirmed by Aletta Edouard [phone]) on  08/14/2019 2:33:30 PM   Radiology DG Chest Port 1 View  Result Date: 08/14/2019  CLINICAL DATA:  Bilateral feet swelling x3 days, Increasing SOB over last 3 days. Hx Pulm HTN, diabetes, CHF, PAD, MI (17 yrs ago), and A-Fib. EXAM: PORTABLE CHEST 1 VIEW COMPARISON:  Chest radiograph in 05/30/2018, 02/24/2018 FINDINGS: Stable cardiomediastinal contours with enlarged heart size. Surgical changes status post median sternotomy and valvular replacement. The lungs are hyperinflated. The lungs are otherwise clear. No pneumothorax or significant pleural effusion. No acute finding in the visualized skeleton. IMPRESSION: No acute cardiopulmonary process. Electronically Signed   By: Audie Pinto M.D.   On: 08/14/2019 14:59    Procedures Procedures (including critical care time)  Medications Ordered in ED Medications  furosemide (LASIX) injection 80 mg (80 mg Intravenous Given 08/14/19 1733)    ED Course  I have reviewed the triage vital signs and the nursing notes.  Pertinent labs & imaging results that were available during my care of the patient were reviewed by me and considered in my medical decision making (see chart for details).  Clinical Course as of Aug 14 1934  Mon Aug 14, 2019  1450 Discussed with Wannetta Sender from cardiology will have somebody from the team come down and evaluate the patient.   [MB]  1450 Chest x-ray interpreted by me as cardiomegaly no gross infiltrates or effusion.   [MB]  1618 Updated the patient on the plan.  He is surprised that his hemoglobin is dropped.  Will do rectal exam.  Have ordered him some IV Lasix.  Still awaiting cardiology consult.  Will sign out to Dr. Ralene Bathe oncoming ED physician to follow-up with cardiology recommendations   [MB]  1627 Patient's hemoglobin seems to be trending down.  He endorses no obvious bleeding.  Rectal exam done normal sphincter tone brown stool.  Sample sent to lab for guaiac.  Technician was chaperone.   [MB]    Clinical Course  User Index [MB] Hayden Rasmussen, MD   MDM Rules/Calculators/A&P                     This patient complains of increased shortness of breath; this involves an extensive number of treatment Options and is a complaint that carries with it a high risk of complications and Morbidity. The differential includes CHF,, ACS, rapid A. Fib, anemia, metabolic derangement  I ordered, reviewed and interpreted labs, which include CBC showing trending down hemoglobin, chemistry with normal renal function, BNP elevated at 970, troponins mildly elevated, Hemoccult positive I ordered medication IV Lasix I ordered imaging studies which included chest x-ray and I independently    visualized and interpreted imaging which showed cardiomegaly but no gross infiltrates Additional history obtained from patient's wife Previous records obtained and reviewed in epic including last cardiology notes I consulted cardiology and discussed lab and imaging findings  Critical Interventions: None  After the interventions stated above, I reevaluated the patient and found patient to be hemodynamically stable.  Cardiology evaluating the patient and setting up for home CHF management.  Anticipate may be able to be discharged with close follow-up.  Signed out to Dr. Ralene Bathe to follow-up on cardiology recommendations  Final Clinical Impression(s) / ED Diagnoses Final diagnoses:  Acute congestive heart failure, unspecified heart failure type [facility])    Rx / DC Orders ED Discharge Orders    None       Hayden Rasmussen, MD 08/14/19 1944

## 2019-08-14 NOTE — ED Provider Notes (Signed)
Patient care assumed at 1630. Pt with hx/o CHF here with increased SOB.  Cards eval pending.    Cards has evaluated the patient.  Will enroll in CHF hospital at home program.  Treating with IV lasix 65m in the ED and will recheck for urine output.     Pt produced 250cc urine shortly after lasix administration.  Fecal occult is positive for blood, pt is anemic.  Denies black or bloody stools.  He will watch his stool closely for blood.  Plan to d/c home with heart failure to home program.  Discussed close follow up and return precautions.     RQuintella Reichert MD 08/14/19 2014

## 2019-08-14 NOTE — Progress Notes (Signed)
Patient's wife calls stating his breathing is very labored, he got on the phone breathing very labored. He had been trying to contact his cardiologist this morning but not answer.    I advised him to call 911 and go to ED right away.  He agreed.  Dr. Burr Medico was made aware.

## 2019-08-14 NOTE — Plan of Care (Signed)
[facility]  Consult Note  Chief Complaint: BLE edema  History of Present Illness: Caleb [name] is a 79 y.o male with PAH, CAD s/p CABG with LIMA-LAD, LA appendage closure, OSE, Persistent AF, HFrEF, and metastatic colon cancer who presented to the ED with progressive BLE edema and DOE. The patient states that he has been taking his torsemide daily but is noticed progressive fatigue, lower extremity edema, and dyspnea on exertion. For the last couple days he is not been moving around the house due to his shortness of breath. He is increases torsemide from 60 mg/20 mg to 60 mg/40 mg but states that his lower extremity edema continues to be progressive. Earlier this morning he felt that his shortness of breath became acutely worse and this prompted him to call 911 and be transported to the emergency department for further evaluation. He denies medication/dietary nonadherence. He had not had any chest pain, palpitations, abdominal pain, early satiety, fevers/chills, headaches, sore throat.  In the ED he was found to be hemodynamically stable. BMP with stable renal function. His BNP was elevated to 969. EKG shows atrial fibrillation with RBBB and no ischemic changes. HS-troponin at 20. Chest x-ray was without pulmonary edema. He was evaluated by the heart failure team at bedside requested admission to the hospital at home program for IV diuresis.  Meds:  Current Meds  Medication Sig  . ascorbic acid (VITAMIN C) 1000 MG tablet Take 1,000 mg by mouth daily.   . digoxin (LANOXIN) 0.125 MG tablet Take 1 tablet (0.125 mg total) by mouth daily.  Marland Kitchen eplerenone (INSPRA) 25 MG tablet TAKE 1 TABLET BY MOUTH ONCE DAILY (Patient taking differently: Take 25 mg by mouth daily. )  . magnesium gluconate (MAGONATE) 500 MG tablet Take 500 mg by mouth 2 (two) times daily.   . metolazone (ZAROXOLYN) 2.5 MG tablet Take 2.5 mg by mouth as needed (fluid retention.).   Marland Kitchen Omega-3 Fatty Acids (FISH OIL PO) Take 360 mg  by mouth every other day.   . potassium chloride SA (KLOR-CON) 20 MEQ tablet Take 2.5 tablets (50 mEq total) by mouth daily. (Patient taking differently: Take 40 mEq by mouth as needed (Potassium levels.). )  . sacubitril-valsartan (ENTRESTO) 24-26 MG Take 1 tablet by mouth 2 (two) times daily.  Marland Kitchen torsemide (DEMADEX) 20 MG tablet Take 3 tablets (60 mg total) by mouth every morning AND 1 tablet (20 mg total) every evening.   Physical Exam: Blood pressure (!) 128/46, pulse 77, temperature 98 F (36.7 C), temperature source Oral, resp. rate 15, height _0  (1.778 m), weight 63.5 kg, SpO2 100 %.  General: Well nourished male in no acute distress HENT: Normocephalic, atraumatic, moist mucus membranes Pulm: Good air movement with no wheezing or crackles  CV: Irregularly regular rhythm, +JVD Abdomen: Active bowel sounds, soft, non-distended, no tenderness to palpation  Extremities: Pulses palpable in all extremities, 3-4+ BLE edema  Skin: Warm and dry  Neuro: Alert and oriented x 3  Clinical Screening: (ALL ANSWERS MUST BE NO)  Based on current presentation is the patient likely to require: advanced diagnostics, advanced imaging (CT, MRI, nuclear stress testing), cardiac catheterization, EGD/colonoscopy, or lab monitoring not amendable to home monitoring (troponin, >q12 hour labs): NO.  Based on current presentation is the patient is likely to require: mechanical ventilation (invasive and noninvasive, history of intubation) and/or vasoactive medications: NO.  Based on current presentation is the patient likely to require a surgical or IR procedure including but not limited  to intraabdominal abscess drainage, percutaneous nephrostomy tube placement, thoracentesis for parapneumonic effusion, surgical wound debridement: NO.  Based on current presentation is the patient is likely to require: daily specialty consultation, blood transfusions, respiratory isolation/airborne precautions, active  adjustments of opiates or IV pain medications: NO.  Does the patient have barriers that would make it unsafe to provide care in the home including but not limited to severe AMS, active substance use disorder, history of or high risk of noncompliance with primary treatment plan: NO.  Has the patient ever been intubated or do they have a new tracheostomy: NO.  Does the patient have an unstable arrhythmia: NO.  Is hemodialysis likely to be required (i.e. already on HD or newly anuric/sever ATN): NO.  Is there risk for inability to obtain IV access: NO.]  Social Screening: (ALL QUESTIONS MUST BE YES) Does the patient have a home recovery environment? YES.  Is the patient's home recovery environment in an eligible geography [facility])? YES.  Does the patient's home have water, electricity, bathroom, heat/ac, refrigerator? YES.  Does the patient feel safe at home? YES.  Are family/caregivers willing to participate, as needed, while the patient participates [facility].  Is there a person in the home (patient or other) willing/able to take vital signs and answer phone calls? YES.  Is the patient willing to put pets in a secure area while [facility] and affiliated staff are in the home? YES.  Is patient willing/able to participate in the [facility] (this includes [facility] affiliated staff entering the home, and associated services)? YES.  Is the patient/patient's HCP willing/able to sign consent? YES.  Assessment / Plan:  Based on the HPI and information obtained the patient is a candidate for the [facility]. Consent has been signed and the patient has been provided with a copy.   The patient has been enrolled in the [facility] has been notified and will present to the patient's house on 08/15/2019.   Acute on Chronic Heart Failure   - Patient to be admitted to the [facility]  program for acute BID IV diureses.  - Once euvolemic restart Torsemide 76m QD - Bilateral unna boots  - Will follow-up iron studies and help to arrange IV iron in short stay  - Follow-up with HF clinic in 1-2 weeks.   Patient's contact information:  Phone: [phone]: [address] [place] [address] Please do not hesitate to call with questions/concerns.   HIna Homes MD 08/14/2019, 5:10 PM  Cell ([phone]

## 2019-08-15 ENCOUNTER — Other Ambulatory Visit: Payer: Self-pay | Admitting: Internal Medicine

## 2019-08-15 ENCOUNTER — Telehealth: Payer: Self-pay | Admitting: Internal Medicine

## 2019-08-15 DIAGNOSIS — I5022 Chronic systolic (congestive) heart failure: Secondary | ICD-10-CM

## 2019-08-15 NOTE — Telephone Encounter (Signed)
In Error

## 2019-08-15 NOTE — Progress Notes (Signed)
Patient with known heart failure admitted to the [facility] program for acute on chronic heart failure. Iron studies sent in the ED, ferritin 105 and iron saturation 14%. Based on AHA guidelines he would benefit from IV iron.   I have called medical day and scheduled the patient for an iron infusion (Feraheme 521m once). He is scheduled for 08/24/2019 at [place] The patient has been notified of this.   JIna Homes MD

## 2019-08-16 DIAGNOSIS — Z7689 Persons encountering health services in other specified circumstances: Secondary | ICD-10-CM | POA: Diagnosis not present

## 2019-08-16 NOTE — Progress Notes (Signed)
Spoke with patient as follow up to his ED visit on 5/24.  He states he is not doing well despite receiving IV lasix through the [facility] program.  His legs/ankles continue to swell despite adequate urine output.  Some shortness of breath.  He feels like a blood transfusion would make him feel better and cardiology would like to but his hemoglobin is not low enough.  They have however scheduled him for IV iron next week.  I encouraged to call me if I can be of any help.

## 2019-08-17 DIAGNOSIS — I5022 Chronic systolic (congestive) heart failure: Secondary | ICD-10-CM | POA: Diagnosis not present

## 2019-08-17 DIAGNOSIS — I10 Essential (primary) hypertension: Secondary | ICD-10-CM | POA: Diagnosis not present

## 2019-08-18 MED FILL — SODIUM CHLORIDE 1 GM TABLET: 1 | 30 days supply | Qty: 30 | Fill #0

## 2019-08-20 DIAGNOSIS — I5031 Acute diastolic (congestive) heart failure: Secondary | ICD-10-CM | POA: Diagnosis not present

## 2019-08-22 MED FILL — metOLazone 2.5 MG TABS: 2.5 | 30 days supply | Qty: 30 | Fill #0

## 2019-08-23 ENCOUNTER — Telehealth (HOSPITAL_COMMUNITY): Payer: Self-pay | Admitting: *Deleted

## 2019-08-23 ENCOUNTER — Telehealth: Payer: Self-pay | Admitting: Hematology

## 2019-08-23 ENCOUNTER — Telehealth: Payer: Self-pay | Admitting: Internal Medicine

## 2019-08-23 ENCOUNTER — Other Ambulatory Visit: Payer: Self-pay

## 2019-08-23 ENCOUNTER — Telehealth: Payer: Self-pay

## 2019-08-23 DIAGNOSIS — I5022 Chronic systolic (congestive) heart failure: Secondary | ICD-10-CM | POA: Diagnosis not present

## 2019-08-23 DIAGNOSIS — I10 Essential (primary) hypertension: Secondary | ICD-10-CM | POA: Diagnosis not present

## 2019-08-23 DIAGNOSIS — C19 Malignant neoplasm of rectosigmoid junction: Secondary | ICD-10-CM

## 2019-08-23 NOTE — Telephone Encounter (Signed)
Caleb [name] with [facility] wanted to speak with Dr. Sharlet Salina regarding the patients palliative care and that the family is requesting that Dr. Sharlet Salina be involved.

## 2019-08-23 NOTE — Telephone Encounter (Signed)
Contacted patient to verify telephone visit for pre reg °

## 2019-08-23 NOTE — Telephone Encounter (Signed)
Patient's son calls to discuss the possibility of having Palliative Care come in now.  His dad's condition has deteriorated.  He is staying in the bed a lot.  Complaining of increased pain.  He asks about a Fentanyl patch.  I explained that Dr. Burr Medico would probably like to prescribe something short acting first and I would speak to her about this.   He verbalized an understanding.  I have made the referral to Palliative Care.

## 2019-08-23 NOTE — Telephone Encounter (Signed)
Per Asencion Partridge with [facility] pt bp low had a presyncopal episode thinks he may just be dried out. Pt has infusion in medical day at 1pm pts son request we see pt prior to his infusion. Pt added to schedule.

## 2019-08-23 NOTE — Telephone Encounter (Signed)
Telephone call to patient to schedule palliative care visit with patient. Patient/family in agreement with home visit on 08/25/19 at 1:00PM

## 2019-08-24 ENCOUNTER — Other Ambulatory Visit: Payer: Self-pay

## 2019-08-24 ENCOUNTER — Inpatient Hospital Stay: Payer: Medicare Other | Attending: Physician Assistant | Admitting: Hematology

## 2019-08-24 ENCOUNTER — Ambulatory Visit (HOSPITAL_COMMUNITY)
Admission: RE | Admit: 2019-08-24 | Discharge: 2019-08-24 | Disposition: A | Discharge status: Home / Self Care | Payer: Medicare Other | Source: Ambulatory Visit | Attending: Cardiology | Admitting: Cardiology

## 2019-08-24 ENCOUNTER — Encounter (HOSPITAL_COMMUNITY): Payer: Self-pay | Admitting: Cardiology

## 2019-08-24 ENCOUNTER — Encounter (HOSPITAL_COMMUNITY)
Admission: RE | Admit: 2019-08-24 | Discharge: 2019-08-24 | Disposition: A | Discharge status: Home / Self Care | Payer: Medicare Other | Source: Ambulatory Visit | Attending: Cardiology | Admitting: Cardiology

## 2019-08-24 ENCOUNTER — Inpatient Hospital Stay: Payer: Medicare Other | Admitting: Hematology

## 2019-08-24 VITALS — BP 122/40 | HR 71 | Wt 142.8 lb

## 2019-08-24 DIAGNOSIS — Z8673 Personal history of transient ischemic attack (TIA), and cerebral infarction without residual deficits: Secondary | ICD-10-CM | POA: Diagnosis not present

## 2019-08-24 DIAGNOSIS — I484 Atypical atrial flutter: Secondary | ICD-10-CM | POA: Insufficient documentation

## 2019-08-24 DIAGNOSIS — I4510 Unspecified right bundle-branch block: Secondary | ICD-10-CM | POA: Diagnosis not present

## 2019-08-24 DIAGNOSIS — E785 Hyperlipidemia, unspecified: Secondary | ICD-10-CM | POA: Insufficient documentation

## 2019-08-24 DIAGNOSIS — I252 Old myocardial infarction: Secondary | ICD-10-CM | POA: Diagnosis not present

## 2019-08-24 DIAGNOSIS — C189 Malignant neoplasm of colon, unspecified: Secondary | ICD-10-CM | POA: Insufficient documentation

## 2019-08-24 DIAGNOSIS — I255 Ischemic cardiomyopathy: Secondary | ICD-10-CM | POA: Insufficient documentation

## 2019-08-24 DIAGNOSIS — I080 Rheumatic disorders of both mitral and aortic valves: Secondary | ICD-10-CM | POA: Insufficient documentation

## 2019-08-24 DIAGNOSIS — R55 Syncope and collapse: Secondary | ICD-10-CM | POA: Diagnosis not present

## 2019-08-24 DIAGNOSIS — Z79899 Other long term (current) drug therapy: Secondary | ICD-10-CM | POA: Diagnosis not present

## 2019-08-24 DIAGNOSIS — C19 Malignant neoplasm of rectosigmoid junction: Secondary | ICD-10-CM

## 2019-08-24 DIAGNOSIS — G4733 Obstructive sleep apnea (adult) (pediatric): Secondary | ICD-10-CM | POA: Insufficient documentation

## 2019-08-24 DIAGNOSIS — I2510 Atherosclerotic heart disease of native coronary artery without angina pectoris: Secondary | ICD-10-CM | POA: Insufficient documentation

## 2019-08-24 DIAGNOSIS — I5022 Chronic systolic (congestive) heart failure: Secondary | ICD-10-CM | POA: Insufficient documentation

## 2019-08-24 DIAGNOSIS — I480 Paroxysmal atrial fibrillation: Secondary | ICD-10-CM | POA: Insufficient documentation

## 2019-08-24 DIAGNOSIS — Z7982 Long term (current) use of aspirin: Secondary | ICD-10-CM | POA: Diagnosis not present

## 2019-08-24 DIAGNOSIS — I110 Hypertensive heart disease with heart failure: Secondary | ICD-10-CM | POA: Insufficient documentation

## 2019-08-24 DIAGNOSIS — I2721 Secondary pulmonary arterial hypertension: Secondary | ICD-10-CM | POA: Insufficient documentation

## 2019-08-24 DIAGNOSIS — R5383 Other fatigue: Secondary | ICD-10-CM | POA: Diagnosis not present

## 2019-08-24 DIAGNOSIS — R001 Bradycardia, unspecified: Secondary | ICD-10-CM | POA: Diagnosis not present

## 2019-08-24 DIAGNOSIS — R531 Weakness: Secondary | ICD-10-CM | POA: Diagnosis not present

## 2019-08-24 DIAGNOSIS — I872 Venous insufficiency (chronic) (peripheral): Secondary | ICD-10-CM | POA: Insufficient documentation

## 2019-08-24 LAB — BASIC METABOLIC PANEL
Anion gap: 13 (ref 5–15)
BUN: 29 mg/dL — ABNORMAL HIGH (ref 8–23)
CO2: 29 mmol/L (ref 22–32)
Calcium: 9.1 mg/dL (ref 8.9–10.3)
Chloride: 86 mmol/L — ABNORMAL LOW (ref 98–111)
Creatinine, Ser: 1.12 mg/dL (ref 0.61–1.24)
GFR calc Af Amer: >60 mL/min (ref >60)
GFR calc non Af Amer: >60 mL/min (ref >60)
Glucose, Bld: 117 mg/dL — ABNORMAL HIGH (ref 70–99)
Potassium: 3.2 mmol/L — ABNORMAL LOW (ref 3.5–5.1)
Sodium: 128 mmol/L — ABNORMAL LOW (ref 135–145)

## 2019-08-24 MED ORDER — SODIUM CHLORIDE 0.9 % IV SOLN
510.0000 mg | Freq: Once | INTRAVENOUS | Status: AC
  Administered 2019-08-24: 510 mg via INTRAVENOUS
  Filled 2019-08-24: qty 17

## 2019-08-24 NOTE — Progress Notes (Signed)
[place]   [phone] [phone]) [phone]   Clinic Follow up Note   Patient Care Team: Hoyt Koch, MD as PCP - General (Internal Medicine) Elsie Stain, MD (Pulmonary Disease) Deboraha Sprang, MD as Consulting Physician (Cardiology) Jonnie Finner, RN as Oncology Nurse Navigator   I connected with Caleb [name] on 08/24/2019 at  4:00 PM EDT by telephone visit and verified that I am speaking with the correct person using two identifiers.  I discussed the limitations, risks, security and privacy concerns of performing an evaluation and management service by telephone and the availability of in person appointments. I also discussed with the patient that there may be a patient responsible charge related to this service. The patient expressed understanding and agreed to proceed.   Other persons participating in the visit and their role in the encounter:  His wife and son  Patient's location:  Home  Provider's location:  Office   CHIEF COMPLAINT: F/u of Metastaticcolorectal adenocarcinoma   SUMMARY OF ONCOLOGIC HISTORY: Oncology History Overview Note  Suspicious stage IV colorectal cancer   Metastatic carcinoma to liver ([place])  06/08/2019 Imaging   US Abdomen  IMPRESSION: 1. Mild cholelithiasis with largest stone measuring 6 mm. Equivocal wall thickening. Recommend clinical correlation for acute cholecystitis.   2. Coarse heterogeneous echogenicity of the liver with suggestion of multiple masses with the largest over the right lobe measuring 4.8 cm. Recommend CT with contrast for further evaluation.   3.  1.6 cm left renal cyst.   06/19/2019 Imaging   CT AP W Contrast  IMPRESSION: 1. Findings of colon cancer at the hepatic flexure with mesenteric adenopathy and extensive hepatic metastatic disease. 2. Chronic findings are noted above.   06/23/2019 Initial Diagnosis   Metastatic carcinoma to liver ([address]). Suspicious stage IV  colorectal cancer.    06/23/2019 Tumor Marker   CEA 98.19    06/27/2019 Imaging   CT Chest W contrast IMPRESSION: 1. Small nonspecific pulmonary nodules are identified measuring up to 4 mm. Small pulmonary metastasis cannot be excluded. 2. Liver metastases. Please see report from diagnostic CT of the abdomen pelvis dated 06/19/2019 for more details. 3. Ascending thoracic aortic aneurysm measuring 4.1 cm. Recommend annual imaging followup by CTA or MRA. This recommendation follows 2010 ACCF/AHA/AATS/ACR/ASA/SCA/SCAI/SIR/STS/SVM Guidelines for the Diagnosis and Management of Patients with Thoracic Aortic Disease. Circulation. 2010; 121: B147-W295. Aortic aneurysm NOS (ICD10-I71.9) 4. 1.5 cm left lobe of thyroid nodule. Recommend thyroid US (ref: J Am Coll Radiol. 2015 Feb;12(2): 143-50).   Aortic Atherosclerosis (ICD10-I70.0).   06/30/2019 Initial Biopsy   FINAL MICROSCOPIC DIAGNOSIS:   A. LIVER, BIOPSY:  - Adenocarcinoma consistent with metastatic colorectal adenocarcinoma.  - See comment.   COMMENT:  There is adenocarcinoma with necrosis and the morphologic features are  consistent with metastatic colorectal adenocarcinoma.  There is  sufficient tissue for additional testing.    06/30/2019 Genetic Testing   KRAS G13D mutation NRAS wildtype (codons 12, 13, 59, 61, 117 and 146 in exons 2, 3 and 4) MSI-Stable  Tumor mutational burden - 4Muts/Mb APC [id number] ERBB3 D297Y FAM123B E380 PIK3CA H1047R TP53 R213   Col[place], stage IV [place]  07/03/2019 Initial Diagnosis   Colorectal cancer, stage IV ([facility])      CURRENT THERAPY:  Supportive care   INTERVAL HISTORY:  Caleb MINEOis scheduled for a phone visit today.  He presented to the emergency room on Aug 11, 2019 for worsening dyspnea on exertion.  He  was treated for congestive heart failure and discharged home.  He now has home care service with IV diuretics as needed.  He followed up with his cardiologist Dr.  Marigene Ehlers today.  Over the past 2 to 3 weeks, he has developed more fatigue, dyspnea on exertion, and decreased appetite.  His son came to visit him last weekend, and has noticed he spends half of the day time in bed.  He is still able to self-care, and ambulates independently.  However he does not have energy to do any other activities.  He has intermittent abdominal pain, for which he takes Tylenol as needed.  His appetite is low, no nausea, diarrhea, or other new symptoms.  Review of system otherwise negative.   MEDICAL HISTORY:  Past Medical History:  Diagnosis Date  . Allergic rhinitis   . Atrial fibrillation ([place])   . Atypical pneumonia   . CAD (coronary artery disease)   . Cardiomyopathy, ischemic   . Chronic anticoagulation   . Cough   . Dizziness   . GERD (gastroesophageal reflux disease)   . Heart failure, systolic, acute on chronic ([place])   . Hyperlipidemia   . Hypertension   . OSA (obstructive sleep apnea)    Home sleep test 07/05/2009 AHI 8.2  . Pleural effusion   . Positional vertigo   . TIA (transient ischemic attack)     SURGICAL HISTORY: Past Surgical History:  Procedure Laterality Date  . AORTIC VALVE REPAIR  01/09/13  . CARDIAC CATHETERIZATION N/A 01/10/2016   Procedure: Right Heart Cath;  Surgeon: Larey Dresser, MD;  Location: [facility];  Service: Cardiovascular;  Laterality: N/A;  . CORONARY ARTERY BYPASS GRAFT  01/09/13   LAD LIMA, left atrial appendage  . CORONARY STENT PLACEMENT  2004   LAD  . MITRAL VALVE ANNULOPLASTY  01/09/13  . RIGHT HEART CATH N/A 11/16/2017   Procedure: RIGHT HEART CATH;  Surgeon: Larey Dresser, MD;  Location: [facility];  Service: Cardiovascular;  Laterality: N/A;  . RIGHT HEART CATHETERIZATION N/A 05/11/2014   Procedure: RIGHT HEART CATH;  Surgeon: Larey Dresser, MD;  Location: [facility] C[facility];  Service: Cardiovascular;  Laterality: N/A;  . SHOULDER SURGERY    . TEE WITHOUT CARDIOVERSION N/A 09/21/2012    Procedure: TRANSESOPHAGEAL ECHOCARDIOGRAM (TEE);  Surgeon: Larey Dresser, MD;  Location: [facility] ENDOSCOPY;  Service: Cardiovascular;  Laterality: N/A;    I have reviewed the social history and family history with the patient and they are unchanged from previous note.  ALLERGIES:  is allergic to carvedilol; codeine; opsumit [macitentan]; dabigatran; pradaxa [dabigatran etexilate mesylate]; sulfonamide derivatives; and xarelto [rivaroxaban].  MEDICATIONS:  Current Outpatient Medications  Medication Sig Dispense Refill  . ascorbic acid (VITAMIN C) 1000 MG tablet Take 1,000 mg by mouth daily.     . digoxin (LANOXIN) 0.125 MG tablet Take 1 tablet (0.125 mg total) by mouth daily. 90 tablet 3  . eplerenone (INSPRA) 25 MG tablet Take 25 mg by mouth daily.    . magnesium gluconate (MAGONATE) 500 MG tablet Take 500 mg by mouth 2 (two) times daily.     . metolazone (ZAROXOLYN) 2.5 MG tablet Take 2.5 mg by mouth as needed (fluid retention.).     Marland Kitchen Omega-3 Fatty Acids (FISH OIL PO) Take 360 mg by mouth every other day.     . potassium chloride SA (KLOR-CON) 20 MEQ tablet Take 40 mEq by mouth as needed.    . sacubitril-valsartan (ENTRESTO) 24-26 MG Take  1 tablet by mouth 2 (two) times daily. 60 tablet 5  . torsemide (DEMADEX) 20 MG tablet Take 3 tablets (60 mg total) by mouth every morning AND 1 tablet (20 mg total) every evening. 540 tablet 2   No current facility-administered medications for this visit.    PHYSICAL EXAMINATION: ECOG PERFORMANCE STATUS: 3 - Symptomatic, >50% confined to bed  No vitals taken today, Exam not performed today   LABORATORY DATA:  I have reviewed the data as listed CBC Latest Ref Rng & Units 08/14/2019 07/31/2019 06/30/2019  WBC 4.0 - 10.5 K/uL 6.3 6.3 7.2  Hemoglobin 13.0 - 17.0 g/dL 10.0(L) 11.7(L) 12.8(L)  Hematocrit 39.0 - 52.0 % 30.3(L) 36.1(L) 39.7  Platelets 150 - 400 K/uL 298 264 261     CMP Latest Ref Rng & Units 08/24/2019 08/14/2019 07/31/2019  Glucose 70 - 99  mg/dL 117(H) 97 93  BUN 8 - 23 mg/dL 29(H) 19 21  Creatinine 0.61 - 1.24 mg/dL 1.12 1.00 0.97  Sodium 135 - 145 mmol/L 128(L) 132(L) 132(L)  Potassium 3.5 - 5.1 mmol/L 3.2(L) 3.9 4.1  Chloride 98 - 111 mmol/L 86(L) 93(L) 93(L)  CO2 22 - 32 mmol/L _0 Calcium 8.9 - 10.3 mg/dL 9.1 8.7(L) 8.9  Total Protein 6.5 - 8.1 g/dL - - 7.4  Total Bilirubin 0.3 - 1.2 mg/dL - - 0.7  Alkaline Phos 38 - 126 U/L - - 117  AST 15 - 41 U/L - - 71(H)  ALT 0 - 44 U/L - - 22      RADIOGRAPHIC STUDIES: I have personally reviewed the radiological images as listed and agreed with the findings in the report. No results found.   ASSESSMENT & PLAN:  RASHAAD HALLSTROM is a 79 y.o. male with    1.Stage IV colorectal cancer of the right hepatic flexure with metastatic adenopathy and extensive hepatic metastatic disease. -Initial CT scan on 06/19/2019 showedhighsuspicion forcolorectal cancer with a right hepatic flexure mass with metastatic adenopathyandextensive hepatic metastatic disease. -Hisliverbiopsy from 06/30/19 showsAdenocarcinoma consistent with metastatic colorectal adenocarcinoma. -I previously recommended first-line systemic treatment with oral Xeloda and IV bevacizumab, he has been thinking about it, but he never committed to try.  -His FO results show he has KRAS mutation which means he is not eligible for target EGFR inhibitor treatments. He has MSI-Stable disease which means his cancer will not respond to immunotherapy alonesuch as Beryle Flock. I reviewed in great detail with him.  -He is overall condition has declined in the past 2 months, his performance status is 3 now.  I do not think he is a candidate for chemotherapy. -I recommend palliative care and hospice, with focus on quality of life.  I discussed with patient, his wife, and his son extensively today.  Both patient and his family agree.  I referred him to palliative care last week, they have appointment early next week.  I  encouraged him to consider hospice.  -I will see him as needed. -I will remain to be his MD if he enrolls to hospice program.  2. Afib, CAD, CHFwith EF20%, HTN, S/p stroke -He is concerned about chemo treatment on his heart function. Will monitor heart function and BP on oral chemo and avastin if he chooses to proceed.  -He will continue to f/u with her cardiologist. I will discuss avastin use with his cardiologist.  -He is on Aericell and NEO40 which is a beet root extract that acts ad a vasodilator.  -He has small lung nodules  as seen on 06/27/19 CT chest, will monitor on future scans.  3.Goal of care discussion  -The patient understands the goal of care is palliative. -I recommend DNR/DNI, he will think about it   PLAN: -Due to his worsening performance status, I do not think he is a candidate for chemotherapy at this point. -I recommend hospice.  He is going to meet palliative care early next week, and will discuss the option of hospice also -I will see him as needed, but will remain to be his provider if he enrolls to hospice    No problem-specific Assessment & Plan notes found for this encounter.   No orders of the defined types were placed in this encounter.  I discussed the assessment and treatment plan with the patient. The patient was provided an opportunity to ask questions and all were answered. The patient agreed with the plan and demonstrated an understanding of the instructions.  The patient was advised to call back or seek an in-person evaluation if the symptoms worsen or if the condition fails to improve as anticipated.  The total time spent in the appointment was 30 minutes.    Truitt Merle, MD 08/24/2019   I, Joslyn Devon, am acting as scribe for Truitt Merle, MD.   I have reviewed the above documentation for accuracy and completeness, and I agree with the above.

## 2019-08-24 NOTE — Discharge Instructions (Signed)

## 2019-08-24 NOTE — Progress Notes (Signed)
Patient ID: Caleb [name], male   DOB: 11/25/1940, 79 y.o.   MRN: [id number]    Advanced Heart Failure Clinic Note   PCP: [name] EP: [name] Cardiology: [name]  79 y.o.with complex past history presents for heart failure followup.  Patient had anterior MI in 2004 and developed ischemic cardiomyopathy as well as mitral regurgitation. He also developed atrial fibrillation.  In 10/14, he had MV repair, Maze, CABG with LIMA-LAD, and LA appendage closure at [facility] in [place].  Subsequently, atrial fibrillation returned and he had an atrial fibrillation ablation in [facility] in 3/15.  He wore a Zio patch in 12/15 and had a low atrial fibrillation burden of 7%.  He has had a long-standing ischemic cardiomyopathy.  In 2015, EF was 20-25%.  Echo in 2016 also showed EF 25-30% but estimated PA pressure suggested severe pulmonary hypertension.     At initial appointment, he reported increased exertional dyspnea over a number of weeks.  RHC was done, showing mildly elevated PCWP with moderate pulmonary HTN and low cardiac index (1.93 thermo, 2.13 Fick).  V/Q scan showed no PE.  I started him on digoxin and have titrated his Lasix to 80 mg daily.  He is now on bisoprolol and able to tolerate it.  At last appointment, I had him try replacing valsartan with Entresto 24/26 bid.  He was unable to tolerate it (made him dizzy, though BP was not low).  Therefore, I had him restart valsartan.  CPX in 4/16 showed mildly decreased functional capacity.  He has tolerated Adcirca 20 mg daily.  He did not tolerate an attempt to uptitrate bisoprolol.  He tried CPAP but was unable to tolerate it.  He was unable to tolerate eplerenone 50 mg daily.  Last echo in 9/17 showed EF 20-25%, stable MV repair, normal RV, and PA systolic pressure 86 mmHg.   He had a Lexiscan Cardiolite in 9/17 that showed infarction, no ischemia.  [name] in 10/17 showed severe mixed pulmonary venous HTN/pulmonary arterial HTN with PVR 4.3 WU  and PCWP mildly elevated.  Adcirca was increased to 40 mg daily.   At a prior appointment, he was having more palpitations.  Event monitor in 11/17 showed atrial fibrillation episodes, these were symptomatic.  Since then, the atrial fibrillation seems to have decreased.  He saw [name] to discuss Tikosyn initiation.  No decision was reached at that time, and since palpitations have decreased again, he wants to hold off on Tikosyn for now.   He has OSA.  He cannot tolerate CPAP but is using his oral appliance.    HR was lower in 5/18.  He came into the office with HR around 40.  ECG showed an ectopic atrial rhythm with very small P waves. I stopped bisoprolol and later digoxin.  HR has gone up since.  He wore an event monitor in 6/18 showing rare 3-5 second pauses at night only, rare atrial fibrillation, and rare junctional bradycardia.  He wore an event monitor again in 4/19, this showed rare afib (1% total) with nocturnal bradycardia and 1 nocturnal 3 second pause.  No concerning findings.   He stopped Adcirca as his insurance was no longer covering it.    He saw [name], and it was decided that he would be unlikely to improve much with CRT with his current IVCD (not true LBBB).   Echo (7/19) is comparable to the past with mildly dilated LV, EF 25% with wall motion abnormalities, mild  RV dilation/mild decreased function, mild to moderate AI, stable repaired mitral valve, PASP 76 mmHg.     Given worsening symptoms, he had RHC in 8/19.  This showed evidence for volume overload with severe pulmonary arterial hypertension, PVR 5.8 WU and preserved cardiac output. Torsemide was increased.    He was started on Opsumit.  He says that it helped his breathing but he was unable to continue it due to considerable worsening of his peripheral edema.  He has stopped Opsumit.    After developing increased atrial fibrillation burden, I put him back on bisoprolol 2.5 mg daily.  He developed bradycardia and  presyncope, so this was stopped.  Lightheadedness resolved.  Zio patch in 6/20 off bisoprolol showed average HR 68, short runs of SVT (possible atrial fibrillation) and only nocturnal bradycardia.    Echo was done in 7/20 and reviewed, EF 15%, moderate-severe LV dilation, mild RV dilation with decreased systolic function, mild-moderate MR s/p MV repair, mild-moderate AI, PASP 40 mmHg.   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  He has a mass at the hepatic flexure and mets to the liver.  Liver biopsy showed colonic adenocarcinoma.   He was seen in the ER about a week ago with volume overload and got IV Lasix in the ER as well as at home with the hospital at home program.  He returns today for followup of CHF.  Performance function is much worse.  He feels very weak and fatigued all the time, short of breath after walking 80-90 feet.  He does not qualify for oxygen by walk test today.  He has an Fe infusion scheduled for today.  No chest pain.   REDS clip 28%.    ECG (personally reviewed): NSR, 1st degree AVB, low voltage, RBBB   6 minute walk (3/16): 381 m  6 minute walk (5/16): 414.5 m 6 minute walk (10/16): 562 m 6 minute walk (1/17): 469 m 6 minute walk (8/17): 488 m 6 minute walk (6/18): 549 m 6 minute walk (11/18): 366 m 6 minute walk (9/19): 518 m 6 minute walk (9/20): 487 m 6 minute walk (12/20): 426 m  Labs (2/15): LDL 144 Labs (8/15): K 4.6, creatinine 0.9 Labs (12/15): HCT 42.3   Labs (2/16): K 4 => 4.2, creatinine 1.05 => 0.92, BNP 268 Labs (3/16): BNP 495 => 320, digoxin 0.4, RF 14.7 (very mild increase), TSH normal, HIV negative, anti-SCL70 negative, creatinine 0.91, K 4.4 Labs (7/16): K 5, creatinine 1.05, BNP 455, vitamin D normal, digoxin 0.4, B12 normal Labs (8/16): digoxin 0.8, BNP 305, K 4.2, creatinine 0.99 Labs (9/16): HCT 43.3, TSH normal, BNP 492 => 278, K 4.3, creatinine 0.97 => 1.04, digoxin 0.6, TSH normal, LDL 154, LDL-P 1654.  Labs  (10/16): K 4.7, creatinine 1.1 Labs (1/17): pro-BNP 2065, digoxin 0.3, K 4.3, creatinine 1.10 => 1.28 Labs (3/17): K 4.1, creatinine 1.09, HCT 38.3 Labs (4/17): K 4.4, creatinine 0.99, digoxin 0.8 Labs (5/17): K 4.3, creatinine 1.08, BNP 317, hgb 13.1 Labs (8/17): K 4.6, creatinine 0.99, BNP 392, digoxin 0.5 Labs (9/17): K 4.4, creatinine 1.05 Labs (10/17): digoxin 0.5, K 4.1, creatinine 1.12, HCT 44.9, digoxin 0.5 Labs (11/17): K 4 => 3.8, creatinine 1.3 => 1.16, digoxin 0.5, BNP 296 Labs (12/17): K 4.1, creatinine 1.15, BNP 294, digoxin 0.7 Labs (2/18): LDL 135, Lp(a) 124, LDL-P 979  Labs (5/18): K 4.3, creatinine 1.07 => 0.95, BNP 224, hgb 14.4, digoxin 0.6 Labs (6/18): K 4.4, creatinine 1.08, hgb  14 Labs (8/18): K 4.1, creatinine 1.05 Labs (12/18): K 3.7, creatinine 0.94 Labs (2/19): K 4.1, creatinine 0.99, BNP 255 Labs (5/19): K 4, creatinine 0.98, LDL 140 Labs (7/19): LDL 136 Labs (9/19): K 4.1, creatinine 0.91 Labs (12/19): K 3.6, creatinine 1.18, BNP 479, hgb 12.9 Labs (3/20): K 3.4, creatinine 1.14 Labs (5/20): K 4.1, creatinine 0.96 Labs (7/20): K 4, creatinine 1.05, BNP 407  Labs (10/20): BNP 489, digoxin < 0.2 Labs (11/20): K 3.5, creatinine 1.0 Labs (12/20): K 3.1, creatinine 1.26 => 1.07, digoxin < 0.2 => 0.5 Labs (4/21): K 3.9, creatinine 1.18, hgb 12.9 Labs (5/21): digoxin 0.6, K 3.9, creatinine 1.0  PMH: 1. CAD: Anterior MI in 2004.  Cardiac surgery in 10/14 included LIMA-LAD.  - Lexiscan Cardiolite (9/17): EF 35%, infarct present with no ischemia.  2. Chronic mitral regurgitation: 10/14 surgery at [facility] with MV repair, Maze, LIMA-LAD, and LA appendage closure.  3. Atrial fibrillation: Paroxysmal.  He was initially on Tikosyn but had breakthrough atrial fibrillation.  H/o Maze in 2014.  Had recurrent atrial fibrillation with ablation in 3/15 by Dr Ola Spurr in [facility].  Not anticoagulated after 2 severe prostate bleeding episodes.  LA appendage was  oversewn with MV surgery.  - Zio patch in 12/15 with low atrial fibrillation burden (7%).  - Holter (9/16) with PACs, PVCs, short atrial fibrillation runs (nothing sustained). - Event monitor (11/17) with runs of atrial fibrillation and flutter.   - Event monitor (6/18) with rare atrial fibrillation - Event monitor 4/19 showing rare afib (1% total) with nocturnal bradycardia and 1 nocturnal 3 second pause.  No concerning findings.  - Zio patch (6/20): Primarily NSR, average HR 68, short SVT runs (possible atrial fibrillation), occasional nocturnal bradycardia (nothing worrisome).  4. Chronotropic incompetence.  5. HTN 6. Hyperlipidemia: Refuses statin.  7. Peripheral neuropathy 8. GERD 9. H/o BPPV 10. H/o TIA 11. Ischemic cardiomyopathy: cardiac MRI 7/14 with EF 35%, moderate MR, normal RV size and systolic function, extensive anterior and anteroseptal LGE suggestive of non-viable myocardium (this was prior to LIMA-LAD).  Echo 1/15 with EF 20-25%, moderate AI, PA systolic pressure 39 mmHg. Echo (1/16) with EF 20-25%, diffuse hypokinesis with regionality, moderate LV dilation, moderate AI, s/p MV repair with mild MR and normal gradients, RV dilated with mildly decreased systolic function, PA systolic pressure 71 mmHg.   - RHC (2/16) with mean RA 9, PA 61/25 mean 40, mean PCWP 23, CI 2.13/PVR 4.3 (Fick), CI 1.93/PVR 4.7 (thermo).   - CPX (4/16) with peak VO2 17.9, VE/VCO2 34.7 => mildly decreased functional capacity.  - Spironolactone apparently caused cognitive deficits - Intolerant of Coreg due to development of severe alopecia - Unable to uptitrate bisoprolol due to intolerance.  - Lightheaded with Entresto.  - Unable to tolerate increase in valsartan to 80 mg bid.  - Echo (1/17) with EF 30-35%, moderate LV dilation, mild AI, s/p MV repair with mild MR, moderately dilated RV with mildly decreased systolic function, PA systolic pressure 69 mmHg.  - CPX (4/17): peak VO2 18, VE/VCO2 slope 33,  RER 1.28 => mild to moderate functional impairment, mildly improved.  - Echo (9/17): EF 20-25% with regional WMAs, normal RV size and systolic function, PASP 86 mmHg, stable repaired mitral valve with mild MR, moderate AI.  - RHC (10/17): mean RA 9, PA 70/23 mean 43, PCWP mean 20, CI 2.96 Fick/2.84 Thermo, PVR 4.3 WU.  - Echo (9/18): severe LV dilation, EF 20-25% with WMAs, s/p MV repair with mild  MR, moderate AI, moderate TR, PASP 68 mmHg, normal RV size and systolic function.  - Echo (7/19): mildly dilated LV, EF 25% with wall motion abnormalities, mild RV dilation/mild decreased function, mild to moderate AI, stable repaired mitral valve (no MS, mild MR), PASP 76 mmHg.   - CPX (8/19): peak VO2 18.9, VE/VCO2 slope 40, RER 1.09 => moderate HF limitation.  - RHC (8/19): mean RA 14, PA 75/28 mean 47, mean PCWP 24, CI 2.44/PVR 5 Fick, CI 2.13/PVR 5.8 thermo.  - Echo (7/20): EF 15%, moderate-severe LV dilation, mild RV dilation with decreased systolic function, mild-moderate MR s/p MV repair, mild-moderate AI, PASP 40 mmHg.  - CPX (1/21): Peak VO2 20, VE/VCO2 slope 46, RER 1.05. Moderate HF limitation, similar to prior.  12. Carotid stenosis: Carotid dopplers (1/16) with 40-59% bilateral ICA stenosis. Carotid dopplers (4/17) with 40-59% BICA stenosis.  - Carotid dopplers (2/18) with < 50% BICA stenosis.  - Carotid dopplers (10/20): Mild stenosis.  13. Aortic insufficiency: Mild-moderate by last echo.  14. Pulmonary HTN: Mixed PAH and pulmonary venous hypertension.  PFTs (9/14) were normal.  V/Q scan (2/16) with no evidence of acute or chronic PE.   - Unable to tolerate Opsumit due to extensive peripheral edema.  15. OSA: Moderate on 5/16 sleep study. Unable to tolerate CPAP.  Repeat sleep study at [facility] in 2/18 was also suggestive of OSA.  16. Venous insufficiency 17. Ventral hernia 18. Bradycardia: Holter (5/18) with overnight pauses up to 4.7 sec (likely due to OSA), occasional short atrial  tachycardia runs, no atrial fibrillation, avg HR 70s, 3% PVCs.  - Event monitor (6/18): Rare 3-4 second pauses at night, rare atrial fibrillation, rare runs of junctional bradycardia.  93. Colon cancer: Diagnosed 2021, metastatic to liver (stage IV).   SH: Married, lives in [place], Engineer, water, nonsmoker  FH: CAD  ROS: All systems reviewed and negative except as per HPI.   Current Outpatient Medications  Medication Sig Dispense Refill  . ascorbic acid (VITAMIN C) 1000 MG tablet Take 1,000 mg by mouth daily.     . digoxin (LANOXIN) 0.125 MG tablet Take 1 tablet (0.125 mg total) by mouth daily. 90 tablet 3  . eplerenone (INSPRA) 25 MG tablet Take 25 mg by mouth daily.    . magnesium gluconate (MAGONATE) 500 MG tablet Take 500 mg by mouth 2 (two) times daily.     . metolazone (ZAROXOLYN) 2.5 MG tablet Take 2.5 mg by mouth as needed (fluid retention.).     Marland Kitchen Omega-3 Fatty Acids (FISH OIL PO) Take 360 mg by mouth every other day.     . potassium chloride SA (KLOR-CON) 20 MEQ tablet Take 40 mEq by mouth as needed.    . sacubitril-valsartan (ENTRESTO) 24-26 MG Take 1 tablet by mouth 2 (two) times daily. 60 tablet 5  . torsemide (DEMADEX) 20 MG tablet Take 3 tablets (60 mg total) by mouth every morning AND 1 tablet (20 mg total) every evening. 540 tablet 2   No current facility-administered medications for this encounter.   BP (!) 122/40   Pulse 71   Wt 64.8 kg (142 lb 12.8 oz)   SpO2 98%   BMI 20.49 kg/m    Wt Readings from Last 3 Encounters:  08/24/19 64.8 kg (142 lb 12.8 oz)  08/14/19 63.5 kg (140 lb)  07/17/19 64 kg (141 lb 1.6 oz)    General: NAD Neck: No JVD, no thyromegaly or thyroid nodule.  Lungs: Clear to auscultation bilaterally with normal respiratory  effort. CV: Nondisplaced PMI.  Heart regular S1/S2, no S3/S4, 1/6 SEM RUSB.  2+ edema 1/2 to knees bilaterally.  No carotid bruit.  Normal pedal pulses.  Abdomen: Soft, nontender, no hepatosplenomegaly, no distention.    Skin: Intact without lesions or rashes.  Neurologic: Alert and oriented x 3.  Psych: Normal affect. Extremities: No clubbing or cyanosis.  HEENT: Normal.   Assessment/Plan: 1. CAD: S/p LIMA-LAD.  He has decided not to take statins after reviewing the data (I did recommend taking a statin but we have agreed to disagree on this, he is taking red yeast rice extract).  Lexiscan Cardiolite was done in 9/17 due to atypical chest pain.  This showed prior infarction with no ischemia.  No recurrence of chest pain since that time despite ongoing exercise.   - Continue ASA 81 daily.  2. S/p mitral valve repair: The MV repair looked stable on 7/20 echo with no MS, mild-moderate MR.  3. Aortic insufficiency: Echo in 9/20 with mild-moderate AI, stable.  4. Chronic systolic CHF: Ischemic cardiomyopathy, EF 15% with mildly decreased RV systolic function on 3/41 echo.  Most recent CPX in 1/21 with moderate HF limitation, no change from prior.  He was volume overloaded on 8/19 RHC with severe pulmonary hypertension but preserved cardiac output.  He is very weak and fatigued, but I do not think that he has significant volume overload from CHF => REDS clip is low (28%) and my exam does not suggest volume overload.  I think that the peripheral edema is from venous insufficiency/hypoproteinemia. Weakness/fatigue may be due to metastatic colon cancer.  - He is taking torsemide 60 qam/20 qpm, can continue this dose.  BMET today.   - Off bisoprolol with bradycardia.  - Continue digoxin 0.125 daily and check level today.   - Continue Entresto 24/26 bid.  I will not increase with soft BP.  - Continue eplerenone 25 (unable to tolerate increase).    - We have discussed dapagliflozin in the past, he wants to hold off on starting new meds for the time being while treatment decisions are made about his cancer.       - He would be unlikely to benefit much from CRT, have reviewed with [name] (had IVCD not LBBB in the past, now  today has RBBB).   5. Atrial fibrillation and atypical atrial flutter: s/p Maze in 10/14, then ablation in 3/15.  Prior to Maze, he was on Tikosyn but had breakthrough.  Currently, he is not anticoagulated due to history of prostate bleeding and his choice.  He did have his LA appendage oversewn at time of MV surgery.   1% atrial fibrillation on 4/19 event monitor.  Zio patch in 6/20 with short SVT runs (possible atrial fibrillation).  NSR today.  - He is off bisoprolol with bradycardia.    - We have had discussions about what to do with his atrial fibrillation/flutter.  He is symptomatic at times when in atrial fibrillation/fluttter. He had breakthrough on Tikosyn in the past, but has had Maze and atrial fibrillation ablation since that time. He opted to hold off on trying Tikosyn again and currently feels like the atrial fibrillation/flutter burden is manageable.   6. Pulmonary hypertension: Severe by echo in 9/17 and on RHC in 10/17.  RV actually looked ok on 9/17 echo.  Mixed pulmonary venous and pulmonary arterial HTN on RHC.  It is possible that the [facility] component is due to pulmonary vascular remodeling in the setting of chronic  mitral regurgitation prior to MV repair. Negative V/Q scan, no evidence for CTEPH.  PFTs normal in 9/14.  He is seeing Dr [place] Bells.  Sleep study showed moderate OSA but he has been unable to tolerate CPAP and is now using an oral device. PASP 76 mmHg by echo in 7/19.  He tried Engineer, site but stopped it so that he could take a nitric oxide supplement.  I repeated RHC in 8/19.  This showed severe mixed pulmonary venous/pulmonary arterial HTN with PVR 5.8 WU and preserved cardiac output. He was started on Opsumit but did not tolerate due to extensive peripheral edema.  - Breathing improved with Opsumit, but he was unable to tolerate due to severe peripheral edema.     - He stopped tadalafil again as he did not feel like it helped (would only take 10 mg daily).  - We have discussed a  trial of selexipag.  He will consider it in the future.   7. OSA: Moderate.  Has not tolerated CPAP. Probably plays a role in recurrent atrial fibrillation and pulmonary hypertension as well as nocturnal sinus pauses.  He has been using his oral device. 8. Carotid stenosis: Mild on 10/20 dopplers.   9. Bradycardia: Nocturnal bradycardia in the past likely related to OSA.  Developed more ongoing bradycardia and bisoprolol and digoxin stopped.  Restarted later on bisoprolol with afib/mild RVR but became bradycardic with pre-syncope and bisoprolol stopped. I think that he has sick sinus syndrome.  He could eventually need a PPM, but currently, his HR is adequate off bisoprolol, and he has tolerated restarting digoxin.   - He will stay off beta blockers.  - Follow digoxin level.  10. Metastatic colon cancer: He is seeing Dr. Burr Medico.  Not good candidate for surgery or intensive chemotherapy. He is considering low intensity chemo Verdene Rio).   Followup in 1 month.   Loralie Champagne 08/24/2019

## 2019-08-24 NOTE — Progress Notes (Signed)
Received response from [facility] for home oximetry monitor.  They will pull order and f/u with patient

## 2019-08-24 NOTE — Telephone Encounter (Signed)
Happy to talk to them.

## 2019-08-24 NOTE — Patient Instructions (Signed)
No medication changes today  You have been ordered for overnight oximetry to monitor your oxygen saturation while you sleep.  You will be contacted by home health regarding the details of this test.   Labs today We will only contact you if something comes back abnormal or we need to make some changes. Otherwise no news is good news!  Your physician recommends that you schedule a follow-up appointment in: 1 month. Keep your next scheduled appointment.   Please call office at [phone] option 2 if you have any questions or concerns.   At the [facility], you and your health needs are our priority. As part of our continuing mission to provide you with exceptional heart care, we have created designated Provider Care Teams. These Care Teams include your primary Cardiologist (physician) and Advanced Practice Providers (APPs- Physician Assistants and Nurse Practitioners) who all work together to provide you with the care you need, when you need it.   You may see any of the following providers on your designated Care Team at your next follow up:  Dr Glori Bickers  Dr Haynes Kerns, NP  Lyda Jester, [place]  Audry Riles, PharmD   Please be sure to bring in all your medications bottles to every appointment.

## 2019-08-25 ENCOUNTER — Other Ambulatory Visit: Payer: Medicare Other

## 2019-08-25 ENCOUNTER — Other Ambulatory Visit: Payer: Self-pay

## 2019-08-25 ENCOUNTER — Other Ambulatory Visit: Payer: Medicare Other | Admitting: *Deleted

## 2019-08-25 ENCOUNTER — Telehealth (HOSPITAL_COMMUNITY): Payer: Self-pay

## 2019-08-25 VITALS — BP 100/47 | HR 68 | Temp 97.6°F | Resp 20

## 2019-08-25 DIAGNOSIS — I5022 Chronic systolic (congestive) heart failure: Secondary | ICD-10-CM

## 2019-08-25 DIAGNOSIS — Z515 Encounter for palliative care: Secondary | ICD-10-CM

## 2019-08-25 MED ORDER — POTASSIUM CHLORIDE CRYS ER 20 MEQ PO TBCR: 20.0000 meq | EXTENDED_RELEASE_TABLET | Freq: Every day | ORAL | 3 refills | Status: DC

## 2019-08-25 NOTE — Telephone Encounter (Signed)
-----  Message from Larey Dresser, MD sent at 08/24/2019 10:14 PM EDT ----- Decrease po fluid intake with low sodium concentration.  Increase total daily KCl by 20 mEq daily. BMET 10 days.

## 2019-08-25 NOTE — Telephone Encounter (Signed)
Patients son advised and verbalized understanding. Lab appt schedule and lab order entered, med list updated to reflect changes

## 2019-08-26 ENCOUNTER — Encounter: Payer: Self-pay | Admitting: Hematology

## 2019-08-28 NOTE — Progress Notes (Signed)
COMMUNITY PALLIATIVE CARE SW NOTE  PATIENT NAME: Caleb [name] DOB: 1940/04/17 MRN: [id number]  PRIMARY CARE PROVIDER: Hoyt Koch, MD  RESPONSIBLE PARTY:  Acct ID - Guarantor Home Phone Work Phone Relationship Acct Type  [id number] Caleb [name], [phone]  Self P/F     [address] [place], [place] [address]     PLAN OF CARE and INTERVENTIONS:             1. GOALS OF CARE/ ADVANCE CARE PLANNING:  Patient's goal is to remain in his home as independent and safe as possible. He is a DNR. 2. SOCIAL/EMOTIONAL/SPIRITUAL ASSESSMENT/ INTERVENTIONS:  SW and RN-Caleb [name] completed a face-to-face visit with patient, his wife-Caleb [name] and his son-Caleb [name] at his home. Patient was sitting at the kitchen table finishing up his lunch. He reported that he was not feeling well-fatigued and tired. The team provided personal introductions, education regarding palliative care program/services and visit frequency. Patient and his family provided verbal consent to services. Patient and family provided information regarding patient medical/social history and current status. Patient reported that he napping more throughout the day. He is having  Profound shortness of breath and abdominal pain, fatigue, constipation. His appetite is poor as report often feeling nausea, full and have no taste. Patient is also hard of hearing, but is engaged. A lengthy conversation was conducted with the family regarding their goals and processed interventions that maybe effective with patient's shortness of breath and fatigue. The palliative care RN will consult with the palliative care nurse practitioner and primary care physician for recommendations. Support provided to his wife-Caleb [name] who is experiencing caregiver fatigue. SW encouraged self-care and discussed the fact that in-home help may be needed for PCG and patient. PCG has a Theatre manager for a Charity fundraiser and will reach out to her. Other resources such as [facility] who she has past experience with was also discussed.  Patient has been married to his second wife-Carol for 36 years. They have four children. Patient is a PhD in Psychology. He has no religious affiliation. Patient has advanced directives. SW provided supportive presence, active listening, education, reinforced palliative care support, obtained background information for assessment purposes, processed needs and coping. 3. PATIENT/CAREGIVER EDUCATION/ COPING: Patient is having increased fatigue and shortness of breath. His wife is experiencing caregiver fatigue. In-home assistance was strongly recommended for them both. 4. PERSONAL EMERGENCY PLAN:  911 can be activated for emergencies. The availability of support from the palliative care team was reinforced. 5. COMMUNITY RESOURCES COORDINATION/ HEALTH CARE NAVIGATION:  PCG to hire private caregiver or contact agency for caregiver assistance. 6. FINANCIAL/LEGAL CONCERNS/INTERVENTIONS: No financial or legal issues.      SOCIAL HX:  Social History   Tobacco Use  . Smoking status: Never Smoker  . Smokeless tobacco: Never Used  Substance Use Topics  . Alcohol use: No    CODE STATUS: DNR ADVANCED DIRECTIVES: No MOST FORM COMPLETE:  No HOSPICE EDUCATION PROVIDED: Yes  PPS: Patient ambulates independently in his home, but he his gait is unsteady and he experiences a great deal of fatigue and shortness of breath. His appetite is poor.   Duration of visit and documentation: 75 minutes.      [address] [place], [place]

## 2019-08-30 ENCOUNTER — Telehealth: Payer: Self-pay | Admitting: Hematology

## 2019-08-30 MED ORDER — HYDROCODONE-ACETAMINOPHEN 5-300 MG PO TABS: 0.5000 | ORAL_TABLET | ORAL | 0 refills | Status: DC | PRN

## 2019-08-30 NOTE — Telephone Encounter (Signed)
Pt called and requested fentanyl patch.  He discussed his pain management with his son, who is an anesthesiologist, and another friend who is a pain specialist, and both recommended him to start with fentanyl patch at 40mg/hr. patient states he has abdominal pain at the level around 5-6, mostly constant still able to function (although overall he has been deteriorating with more shortness of breath and fatigue), he has not used any narcotics, he takes Tylenol as needed which helps a little.  I discussed the potential side effect from narcotics, and recommend him to start with low-dose short acting narcotics, such as hydrocodone or oxycodone, to see how his body reacts.  I am concerned that fentanyl patch may cause severe sedation, constipation to him given his narcotics nave.  After lengthy discussion, he agrees to try hydrocodone, according to [facility].  He will start at half tablet every 4-6 hours as needed.  He knows to use more laxatives for his constipation.   He is under palliative care now, we again reviewed the benefit of hospice program.  He will think about it.   I asked patient to give me a call within the next 4-5 days, to review his pain management again.  He agrees, and appreciated the call.  YTruitt Merle 08/30/2019

## 2019-08-31 ENCOUNTER — Telehealth: Payer: Self-pay

## 2019-08-31 ENCOUNTER — Telehealth: Payer: Self-pay | Admitting: Gastroenterology

## 2019-08-31 ENCOUNTER — Telehealth (HOSPITAL_COMMUNITY): Payer: Self-pay

## 2019-08-31 NOTE — Telephone Encounter (Signed)
I spoke to [facility] staff regarding hydrocodone 5/325 prescription and confirmed this prescription has been filled.  This prescription is not showing on [name] med list this am.

## 2019-08-31 NOTE — Telephone Encounter (Signed)
Pt reported that the medications that he takes causes constipation.  Pt would like a call back to discuss.

## 2019-08-31 NOTE — Telephone Encounter (Signed)
Spoke to patient who complains of constipation. He had a bowel movement 2 days ago and felt relief. Patient will try MOM as directed. He has tried Mag Citrate without much relief. Patient will call back if he does not get relief from MOM.

## 2019-08-31 NOTE — Telephone Encounter (Signed)
Home health nurse called to inquire on behalf of patients wife, if [name] needed to keep his pending lab appointment since [facility] was going to draw the labs. I informed her that he does not need to keep that lab appointment and I went ahead and cancelled the lab appt.

## 2019-08-31 NOTE — Telephone Encounter (Signed)
Received VM from [facility] stating they do not carry the hydrocodone 5/300, they do carry the hydrocodone 5/325.  They will need a new prescription for the 5/325.  Message given to Dr. Burr Medico

## 2019-09-03 ENCOUNTER — Other Ambulatory Visit
Admission: RE | Admit: 2019-09-03 | Discharge: 2019-09-03 | Disposition: A | Discharge status: Home / Self Care | Payer: Medicare Other | Source: Ambulatory Visit | Attending: Internal Medicine | Admitting: Internal Medicine

## 2019-09-03 DIAGNOSIS — Z029 Encounter for administrative examinations, unspecified: Secondary | ICD-10-CM | POA: Insufficient documentation

## 2019-09-03 LAB — CBC WITH DIFFERENTIAL/PLATELET
Abs Immature Granulocytes: 0.04 10*3/uL (ref 0.00–0.07)
Immature Granulocytes: 1 %
nRBC: 0 % (ref 0.0–0.2)

## 2019-09-03 LAB — URINALYSIS, COMPLETE (UACMP) WITH MICROSCOPIC
Bilirubin Urine: NEGATIVE
Glucose, UA: NEGATIVE mg/dL
Hgb urine dipstick: NEGATIVE
Ketones, ur: NEGATIVE mg/dL
Leukocytes,Ua: NEGATIVE
Nitrite: NEGATIVE
Protein, ur: NEGATIVE mg/dL
Specific Gravity, Urine: 1.012 (ref 1.005–1.030)
Squamous Epithelial / HPF: NONE SEEN (ref 0–5)
pH: 7 (ref 5.0–8.0)

## 2019-09-03 LAB — BASIC METABOLIC PANEL
Anion gap: 14 (ref 5–15)
BUN: 23 mg/dL (ref 8–23)
CO2: 27 mmol/L (ref 22–32)
Calcium: 8.6 mg/dL — ABNORMAL LOW (ref 8.9–10.3)
Chloride: 88 mmol/L — ABNORMAL LOW (ref 98–111)
Creatinine, Ser: 1.33 mg/dL — ABNORMAL HIGH (ref 0.61–1.24)
GFR calc Af Amer: 58 mL/min — ABNORMAL LOW (ref >60)
GFR calc non Af Amer: 50 mL/min — ABNORMAL LOW (ref >60)
Glucose, Bld: 69 mg/dL — ABNORMAL LOW (ref 70–99)
Potassium: 4.5 mmol/L (ref 3.5–5.1)
Sodium: 129 mmol/L — ABNORMAL LOW (ref 135–145)

## 2019-09-04 ENCOUNTER — Other Ambulatory Visit: Payer: Self-pay

## 2019-09-04 ENCOUNTER — Other Ambulatory Visit (HOSPITAL_COMMUNITY): Payer: Medicare Other

## 2019-09-04 DIAGNOSIS — C19 Malignant neoplasm of rectosigmoid junction: Secondary | ICD-10-CM

## 2019-09-05 ENCOUNTER — Other Ambulatory Visit: Payer: Self-pay

## 2019-09-05 ENCOUNTER — Encounter (HOSPITAL_COMMUNITY): Payer: Self-pay | Admitting: Hematology

## 2019-09-05 ENCOUNTER — Other Ambulatory Visit: Payer: Self-pay | Admitting: Medical

## 2019-09-05 ENCOUNTER — Inpatient Hospital Stay: Payer: Medicare Other

## 2019-09-05 ENCOUNTER — Inpatient Hospital Stay (HOSPITAL_BASED_OUTPATIENT_CLINIC_OR_DEPARTMENT_OTHER): Payer: Medicare Other | Admitting: Hematology

## 2019-09-05 ENCOUNTER — Inpatient Hospital Stay (HOSPITAL_COMMUNITY)
Admit: 2019-09-05 | Discharge: 2019-09-07 | DRG: 951 | Disposition: A | Discharge status: Hospice | Payer: Medicare Other | Source: Ambulatory Visit | Attending: Hematology | Admitting: Hematology

## 2019-09-05 ENCOUNTER — Inpatient Hospital Stay (HOSPITAL_BASED_OUTPATIENT_CLINIC_OR_DEPARTMENT_OTHER): Payer: Medicare Other | Admitting: Medical

## 2019-09-05 VITALS — BP 109/52 | HR 67 | Temp 97.3°F | Resp 16

## 2019-09-05 DIAGNOSIS — Z833 Family history of diabetes mellitus: Secondary | ICD-10-CM

## 2019-09-05 DIAGNOSIS — Z885 Allergy status to narcotic agent status: Secondary | ICD-10-CM | POA: Diagnosis not present

## 2019-09-05 DIAGNOSIS — Z955 Presence of coronary angioplasty implant and graft: Secondary | ICD-10-CM | POA: Diagnosis not present

## 2019-09-05 DIAGNOSIS — Z7189 Other specified counseling: Secondary | ICD-10-CM

## 2019-09-05 DIAGNOSIS — Z888 Allergy status to other drugs, medicaments and biological substances status: Secondary | ICD-10-CM | POA: Diagnosis not present

## 2019-09-05 DIAGNOSIS — Z20822 Contact with and (suspected) exposure to covid-19: Secondary | ICD-10-CM | POA: Diagnosis present

## 2019-09-05 DIAGNOSIS — I272 Pulmonary hypertension, unspecified: Secondary | ICD-10-CM | POA: Diagnosis present

## 2019-09-05 DIAGNOSIS — Z66 Do not resuscitate: Secondary | ICD-10-CM | POA: Diagnosis present

## 2019-09-05 DIAGNOSIS — C787 Secondary malignant neoplasm of liver and intrahepatic bile duct: Secondary | ICD-10-CM

## 2019-09-05 DIAGNOSIS — R627 Adult failure to thrive: Secondary | ICD-10-CM | POA: Diagnosis present

## 2019-09-05 DIAGNOSIS — K219 Gastro-esophageal reflux disease without esophagitis: Secondary | ICD-10-CM | POA: Diagnosis present

## 2019-09-05 DIAGNOSIS — E860 Dehydration: Secondary | ICD-10-CM | POA: Diagnosis present

## 2019-09-05 DIAGNOSIS — C189 Malignant neoplasm of colon, unspecified: Secondary | ICD-10-CM | POA: Diagnosis not present

## 2019-09-05 DIAGNOSIS — K5900 Constipation, unspecified: Secondary | ICD-10-CM | POA: Diagnosis present

## 2019-09-05 DIAGNOSIS — I2510 Atherosclerotic heart disease of native coronary artery without angina pectoris: Secondary | ICD-10-CM | POA: Diagnosis present

## 2019-09-05 DIAGNOSIS — Z515 Encounter for palliative care: Secondary | ICD-10-CM | POA: Diagnosis not present

## 2019-09-05 DIAGNOSIS — I4891 Unspecified atrial fibrillation: Secondary | ICD-10-CM | POA: Diagnosis present

## 2019-09-05 DIAGNOSIS — G4733 Obstructive sleep apnea (adult) (pediatric): Secondary | ICD-10-CM | POA: Diagnosis present

## 2019-09-05 DIAGNOSIS — Z79899 Other long term (current) drug therapy: Secondary | ICD-10-CM

## 2019-09-05 DIAGNOSIS — R0602 Shortness of breath: Secondary | ICD-10-CM

## 2019-09-05 DIAGNOSIS — I429 Cardiomyopathy, unspecified: Secondary | ICD-10-CM

## 2019-09-05 DIAGNOSIS — Z8673 Personal history of transient ischemic attack (TIA), and cerebral infarction without residual deficits: Secondary | ICD-10-CM | POA: Diagnosis not present

## 2019-09-05 DIAGNOSIS — E785 Hyperlipidemia, unspecified: Secondary | ICD-10-CM | POA: Diagnosis present

## 2019-09-05 DIAGNOSIS — C19 Malignant neoplasm of rectosigmoid junction: Secondary | ICD-10-CM | POA: Diagnosis not present

## 2019-09-05 DIAGNOSIS — I110 Hypertensive heart disease with heart failure: Secondary | ICD-10-CM | POA: Diagnosis present

## 2019-09-05 DIAGNOSIS — R630 Anorexia: Secondary | ICD-10-CM | POA: Diagnosis present

## 2019-09-05 DIAGNOSIS — Z882 Allergy status to sulfonamides status: Secondary | ICD-10-CM | POA: Diagnosis not present

## 2019-09-05 DIAGNOSIS — I5022 Chronic systolic (congestive) heart failure: Secondary | ICD-10-CM | POA: Diagnosis present

## 2019-09-05 DIAGNOSIS — I255 Ischemic cardiomyopathy: Secondary | ICD-10-CM | POA: Diagnosis present

## 2019-09-05 DIAGNOSIS — Z951 Presence of aortocoronary bypass graft: Secondary | ICD-10-CM | POA: Diagnosis not present

## 2019-09-05 DIAGNOSIS — Z7401 Bed confinement status: Secondary | ICD-10-CM

## 2019-09-05 LAB — CBC WITH DIFFERENTIAL (CANCER CENTER ONLY)
Abs Immature Granulocytes: 0.04 10*3/uL (ref 0.00–0.07)
Basophils Absolute: 0 10*3/uL (ref 0.0–0.1)
Basophils Relative: 0 %
Eosinophils Absolute: 0.2 10*3/uL (ref 0.0–0.5)
Eosinophils Relative: 2 %
HCT: 34.8 % — ABNORMAL LOW (ref 39.0–52.0)
Hemoglobin: 11.5 g/dL — ABNORMAL LOW (ref 13.0–17.0)
Immature Granulocytes: 1 %
Lymphocytes Relative: 15 %
Lymphs Abs: 1.2 10*3/uL (ref 0.7–4.0)
MCH: 29.6 pg (ref 26.0–34.0)
MCHC: 33 g/dL (ref 30.0–36.0)
MCV: 89.7 fL (ref 80.0–100.0)
Monocytes Absolute: 0.8 10*3/uL (ref 0.1–1.0)
Monocytes Relative: 10 %
Neutro Abs: 5.7 10*3/uL (ref 1.7–7.7)
Neutrophils Relative %: 72 %
Platelet Count: 293 10*3/uL (ref 150–400)
RBC: 3.88 MIL/uL — ABNORMAL LOW (ref 4.22–5.81)
RDW: 17.9 % — ABNORMAL HIGH (ref 11.5–15.5)
WBC Count: 7.9 10*3/uL (ref 4.0–10.5)
nRBC: 0 % (ref 0.0–0.2)

## 2019-09-05 LAB — COMPREHENSIVE METABOLIC PANEL
ALT: 42 U/L (ref 0–44)
AST: 94 U/L — ABNORMAL HIGH (ref 15–41)
Albumin: 3 g/dL — ABNORMAL LOW (ref 3.5–5.0)
Alkaline Phosphatase: 254 U/L — ABNORMAL HIGH (ref 38–126)
Anion gap: 12 (ref 5–15)
BUN: 24 mg/dL — ABNORMAL HIGH (ref 8–23)
CO2: 28 mmol/L (ref 22–32)
Calcium: 8.8 mg/dL — ABNORMAL LOW (ref 8.9–10.3)
Chloride: 91 mmol/L — ABNORMAL LOW (ref 98–111)
Creatinine, Ser: 1.44 mg/dL — ABNORMAL HIGH (ref 0.61–1.24)
GFR calc Af Amer: 53 mL/min — ABNORMAL LOW (ref >60)
GFR calc non Af Amer: 46 mL/min — ABNORMAL LOW (ref >60)
Glucose, Bld: 96 mg/dL (ref 70–99)
Potassium: 4.5 mmol/L (ref 3.5–5.1)
Sodium: 131 mmol/L — ABNORMAL LOW (ref 135–145)
Total Bilirubin: 1.6 mg/dL — ABNORMAL HIGH (ref 0.3–1.2)
Total Protein: 7.3 g/dL (ref 6.5–8.1)

## 2019-09-05 LAB — SARS CORONAVIRUS 2 (TAT 6-24 HRS): SARS Coronavirus 2: NEGATIVE

## 2019-09-05 MED ORDER — LORAZEPAM 1 MG PO TABS: 1.0000 mg | ORAL_TABLET | ORAL | Status: DC | PRN

## 2019-09-05 MED ORDER — MORPHINE SULFATE (PF) 2 MG/ML IV SOLN: 2.0000 mg | INTRAVENOUS | Status: DC | PRN

## 2019-09-05 MED ORDER — SACUBITRIL-VALSARTAN 24-26 MG PO TABS
1.0000 | ORAL_TABLET | Freq: Two times a day (BID) | ORAL | Status: DC
  Administered 2019-09-05 – 2019-09-07 (×4): 1 via ORAL
  Filled 2019-09-05 (×4): qty 1

## 2019-09-05 MED ORDER — ONDANSETRON HCL 4 MG/2ML IJ SOLN
INTRAMUSCULAR | Status: AC
  Filled 2019-09-05: qty 4

## 2019-09-05 MED ORDER — LORAZEPAM 2 MG/ML IJ SOLN: 1.0000 mg | INTRAMUSCULAR | Status: DC | PRN

## 2019-09-05 MED ORDER — SODIUM CHLORIDE 0.9 % IV SOLN
8.0000 mg | Freq: Three times a day (TID) | INTRAVENOUS | Status: DC | PRN
  Filled 2019-09-05: qty 4

## 2019-09-05 MED ORDER — SODIUM CHLORIDE 0.9 % IV SOLN: INTRAVENOUS | Status: AC

## 2019-09-05 MED ORDER — ONDANSETRON 4 MG PO TBDP: 8.0000 mg | ORAL_TABLET | Freq: Three times a day (TID) | ORAL | Status: DC | PRN

## 2019-09-05 MED ORDER — SODIUM CHLORIDE 0.9% FLUSH
3.0000 mL | INTRAVENOUS | Status: DC | PRN
  Administered 2019-09-05: 3 mL via INTRAVENOUS
  Filled 2019-09-05: qty 10

## 2019-09-05 MED ORDER — HYDROXYZINE HCL 25 MG PO TABS: 25.0000 mg | ORAL_TABLET | Freq: Three times a day (TID) | ORAL | Status: DC | PRN

## 2019-09-05 MED ORDER — POLYETHYLENE GLYCOL 3350 17 G PO PACK
17.0000 g | PACK | Freq: Four times a day (QID) | ORAL | Status: DC | PRN
  Administered 2019-09-05: 17 g via ORAL
  Filled 2019-09-05: qty 1

## 2019-09-05 MED ORDER — MILK AND MOLASSES ENEMA
1.0000 | Freq: Every day | RECTAL | Status: DC | PRN
  Administered 2019-09-07: 240 mL via RECTAL
  Filled 2019-09-05: qty 240

## 2019-09-05 MED ORDER — ONDANSETRON HCL 4 MG/2ML IJ SOLN
8.0000 mg | Freq: Once | INTRAMUSCULAR | Status: AC
  Administered 2019-09-05: 8 mg via INTRAVENOUS

## 2019-09-05 MED ORDER — SENNOSIDES 8.8 MG/5ML PO SYRP
10.0000 mL | ORAL_SOLUTION | Freq: Every day | ORAL | Status: DC
  Administered 2019-09-05 – 2019-09-06 (×2): 10 mL via ORAL
  Filled 2019-09-05 (×2): qty 10

## 2019-09-05 MED ORDER — PROCHLORPERAZINE MALEATE 10 MG PO TABS: 10.0000 mg | ORAL_TABLET | Freq: Four times a day (QID) | ORAL | Status: DC | PRN

## 2019-09-05 MED ORDER — SODIUM CHLORIDE 0.9 % IV SOLN
INTRAVENOUS | Status: DC
  Filled 2019-09-05: qty 250

## 2019-09-05 MED ORDER — ACETAMINOPHEN 160 MG/5ML PO SOLN: 500.0000 mg | Freq: Four times a day (QID) | ORAL | Status: DC | PRN

## 2019-09-05 MED ORDER — DIGOXIN 125 MCG PO TABS
0.1250 mg | ORAL_TABLET | Freq: Every day | ORAL | Status: DC
  Administered 2019-09-06 – 2019-09-07 (×2): 0.125 mg via ORAL
  Filled 2019-09-05 (×2): qty 1

## 2019-09-05 MED ORDER — MORPHINE SULFATE (PF) 2 MG/ML IV SOLN: 1.0000 mg | INTRAVENOUS | Status: DC | PRN

## 2019-09-05 MED ORDER — HYDROCODONE-ACETAMINOPHEN 5-325 MG PO TABS: 1.0000 | ORAL_TABLET | ORAL | Status: DC | PRN

## 2019-09-05 MED ORDER — ZOLPIDEM TARTRATE 5 MG PO TABS
5.0000 mg | ORAL_TABLET | Freq: Every evening | ORAL | Status: DC | PRN
  Filled 2019-09-05: qty 1

## 2019-09-05 MED ORDER — SODIUM CHLORIDE 0.9 % IV SOLN
10.0000 mg | Freq: Once | INTRAVENOUS | Status: AC
  Administered 2019-09-05: 10 mg via INTRAVENOUS
  Filled 2019-09-05: qty 10

## 2019-09-05 MED ORDER — SODIUM CHLORIDE 0.9 % IV SOLN: Freq: Once | INTRAVENOUS | Status: DC

## 2019-09-05 MED ORDER — BISACODYL 10 MG RE SUPP
10.0000 mg | Freq: Four times a day (QID) | RECTAL | Status: DC | PRN
  Administered 2019-09-05 – 2019-09-07 (×2): 10 mg via RECTAL
  Filled 2019-09-05: qty 1

## 2019-09-05 MED ORDER — LORAZEPAM 2 MG/ML IJ SOLN: 0.5000 mg | INTRAMUSCULAR | Status: DC | PRN

## 2019-09-05 MED ORDER — PROCHLORPERAZINE EDISYLATE 10 MG/2ML IJ SOLN: 10.0000 mg | Freq: Four times a day (QID) | INTRAMUSCULAR | Status: DC | PRN

## 2019-09-05 NOTE — H&P (Signed)
Caleb [name]  [phone]   HEMATOLOGY ONCOLOGY INPATIENT ADMISSION HISTORY AND PHYSICAL   Caleb [name]  DOB: 1940/08/21  MR#: [id number]  CSN#: [id number]    Patient Care Team: Hoyt Koch, MD as PCP - General (Internal Medicine) Elsie Stain, MD (Pulmonary Disease) Deboraha Sprang, MD as Consulting Physician (Cardiology) Jonnie Finner, RN as Oncology Nurse Navigator  Chief complain: Failure to thrive at home  History of present illness:   Caleb [name] is a 79 year old gentleman with recently diagnosed metastatic colon cancer, extensive heart disease, presented with worsening fatigue, anorexia, abdominal pain and body aches, failure to thrive at home.  He is being admitted directly from my office for symptom management, and transition to hospice care.  He was diagnosed with metastatic colon cancer to liver and lymph nodes in March 2021, palliative chemo was discussed offered to him initially, he declined.  He has history of coronary artery disease, A. fib, hypertension, congestive heart failure with EF 20%, pulmonary hypertension, history of stroke.  He has been falling with his cardiologist Dr. Algernon Huxley closely.  He has developed worsening shortness of breath on exertion in the past few months, was seen in the ED on Aug 14, 2019, and have tried intravenous diuretics at home, without significant improvement.  He has gradually developed worsening fatigue, intermittent abdominal pain in the right upper quadrant, and body aches since his cancer diagnosis.  His symptom has been much worse in the past week, to the point it was difficult for him to ambulate at home since yesterday.  He denies any fever or chills, productive cough, or dysuria.  I have previously referred him to palliative care, and strongly encouraged him to consider hospice he previously declined.  He came in today due to the worsening symptoms, seen in my office, with stable vital signs.   Goal of care discussed again, he is ready for hospice.  I will admit him to hospital for symptom management and transition him to hospice care.  MEDICAL HISTORY:  Past Medical History:  Diagnosis Date   Allergic rhinitis    Atrial fibrillation ([place])    Atypical pneumonia    CAD (coronary artery disease)    Cardiomyopathy, ischemic    Chronic anticoagulation    Cough    Dizziness    GERD (gastroesophageal reflux disease)    Heart failure, systolic, acute on chronic (HCC)    Hyperlipidemia    Hypertension    OSA (obstructive sleep apnea)    Home sleep test 07/05/2009 AHI 8.2   Pleural effusion    Positional vertigo    TIA (transient ischemic attack)     SURGICAL HISTORY: Past Surgical History:  Procedure Laterality Date   AORTIC VALVE REPAIR  01/09/13   CARDIAC CATHETERIZATION N/A 01/10/2016   Procedure: Right Heart Cath;  Surgeon: Larey Dresser, MD;  Location: [place] [facility];  Service: Cardiovascular;  Laterality: N/A;   CORONARY ARTERY BYPASS GRAFT  01/09/13   LAD LIMA, left atrial appendage   CORONARY STENT PLACEMENT  2004   LAD   MITRAL VALVE ANNULOPLASTY  01/09/13   RIGHT HEART CATH N/A 11/16/2017   Procedure: RIGHT HEART CATH;  Surgeon: Larey Dresser, MD;  Location: [place] [facility];  Service: Cardiovascular;  Laterality: N/A;   RIGHT HEART CATHETERIZATION N/A 05/11/2014   Procedure: RIGHT HEART CATH;  Surgeon: Larey Dresser, MD;  Location: [facility];  Service: Cardiovascular;  Laterality: N/A;   SHOULDER  SURGERY     TEE WITHOUT CARDIOVERSION N/A 09/21/2012   Procedure: TRANSESOPHAGEAL ECHOCARDIOGRAM (TEE);  Surgeon: Larey Dresser, MD;  Location: [facility];  Service: Cardiovascular;  Laterality: N/A;    SOCIAL HISTORY: Social History   Socioeconomic History   Marital status: Married    Spouse name: Not on file   Number of children: Not on file   Years of education: Not on file   Highest education level: Not on file    Occupational History   Occupation: PHYSICIAN    Employer: [employer]    Comment: Psychologist  Tobacco Use   Smoking status: Never Smoker   Smokeless tobacco: Never Used  Scientific laboratory technician Use: Never used  Substance and Sexual Activity   Alcohol use: No   Drug use: No   Sexual activity: Not on file  Other Topics Concern   Not on file  Social History Narrative   Married. Wife used to see Dr. Arnoldo Morale. He saw Dr. Sherren Mocha. 2 sons- 1 is anesthesiologist. 2 grandsons.       Phd- clinical psychology basically at [employer]. 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       Hobbies:  Model train, reading, walking in woods, prior fishing, prior golfing   Social Determinants of Health   Financial Resource Strain:    Difficulty of Paying Living Expenses:   Food Insecurity:    Worried About Charity fundraiser in the Last Year:    Arboriculturist in the Last Year:   Transportation Needs:    Film/video editor (Medical):    Lack of Transportation (Non-Medical):   Physical Activity:    Days of Exercise per Week:    Minutes of Exercise per Session:   Stress:    Feeling of Stress :   Social Connections:    Frequency of Communication with Friends and Family:    Frequency of Social Gatherings with Friends and Family:    Attends Religious Services:    Active Member of Clubs or Organizations:    Attends Music therapist:    Marital Status:   Intimate Partner Violence:    Fear of Current or Ex-Partner:    Emotionally Abused:    Physically Abused:    Sexually Abused:     FAMILY HISTORY: Family History  Adopted: Yes  Problem Relation Age of Onset   Diabetes Other        [place]   Heart disease Neg Hx    Hypertension Neg Hx     ALLERGIES:  is allergic to carvedilol, codeine, opsumit [macitentan], dabigatran, pradaxa [dabigatran etexilate mesylate], sulfonamide derivatives, and xarelto  [rivaroxaban].  MEDICATIONS:  No current facility-administered medications for this encounter.   Current Outpatient Medications  Medication Sig Dispense Refill   ascorbic acid (VITAMIN C) 1000 MG tablet Take 1,000 mg by mouth daily.      digoxin (LANOXIN) 0.125 MG tablet Take 1 tablet (0.125 mg total) by mouth daily. 90 tablet 3   eplerenone (INSPRA) 25 MG tablet Take 25 mg by mouth daily.     HYDROcodone-Acetaminophen 5-300 MG TABS Take 0.5-1 tablets by mouth every 4 (four) hours as needed. 30 tablet 0   magnesium gluconate (MAGONATE) 500 MG tablet Take 500 mg by mouth 2 (two) times daily.      metolazone (ZAROXOLYN) 2.5 MG tablet Take 2.5 mg by mouth as needed (fluid retention.).      Omega-3 Fatty Acids (FISH OIL PO) Take  360 mg by mouth every other day.      potassium chloride SA (KLOR-CON) 20 MEQ tablet Take 1 tablet (20 mEq total) by mouth daily. 90 tablet 3   sacubitril-valsartan (ENTRESTO) 24-26 MG Take 1 tablet by mouth 2 (two) times daily. 60 tablet 5   torsemide (DEMADEX) 20 MG tablet Take 3 tablets (60 mg total) by mouth every morning AND 1 tablet (20 mg total) every evening. 540 tablet 2   Facility-Administered Medications Ordered in Other Encounters  Medication Dose Route Frequency Provider Last Rate Last Admin   0.9 %  sodium chloride infusion   Intravenous Continuous Truitt Merle, MD       0.9 %  sodium chloride infusion   Intravenous Continuous Truitt Merle, MD 50 mL/hr at 09/05/19 1418 New Bag at 09/05/19 1418   dexamethasone (DECADRON) 10 mg in sodium chloride 0.9 % 50 mL IVPB  10 mg Intravenous Once Truitt Merle, MD        REVIEW OF SYSTEMS:   (+) Very fatigued, with poor appetite, low oral intake. (+) Bilateral lower extremity edema (+) Dyspnea on exertion, no chest pain or productive cough (+) Right upper quadrant abdominal pain, and diffuse body aches  PHYSICAL EXAMINATION: ECOG PERFORMANCE STATUS: 4 - Bedbound GENERAL:alert, chronically  ill-appearing SKIN: skin color, texture, turgor are normal, no rashes or significant lesions EYES: normal, conjunctiva are pink and non-injected, sclera clear OROPHARYNX:no exudate, no erythema and lips, buccal mucosa, and tongue normal  NECK: supple, thyroid normal size, non-tender, without nodularity LYMPH:  no palpable lymphadenopathy in the cervical, axillary or inguinal LUNGS: clear to auscultation and percussion with normal breathing effort HEART: regular rate & rhythm and no murmurs and no lower extremity edema ABDOMEN:abdomen soft, non-tender and normal bowel sounds Musculoskeletal:no cyanosis of digits and no clubbing, mild pitting edema up to knee on both legs PSYCH: alert & oriented x 3 with fluent speech NEURO: no focal motor/sensory deficits  LABORATORY DATA:  I have reviewed the data as listed Lab Results  Component Value Date   WBC 7.9 09/05/2019   HGB 11.5 (L) 09/05/2019   HCT 34.8 (L) 09/05/2019   MCV 89.7 09/05/2019   PLT 293 09/05/2019   Recent Labs    06/23/19 1451 06/23/19 1451 06/30/19 1134 06/30/19 1134 07/31/19 1236 07/31/19 1236 08/14/19 1450 08/24/19 1249 09/03/19 1430  NA 139   < > 135   < > 132*   < > 132* 128* 129*  K 3.9   < > 4.3   < > 4.1   < > 3.9 3.2* 4.5  CL 96*   < > 94*   < > 93*   < > 93* 86* 88*  CO2 28   < > 28   < > 28   < > _0 GLUCOSE 106*   < > 100*   < > 93   < > 97 117* 69*  BUN 20   < > 26*   < > 21   < > 19 29* 23  CREATININE 1.18   < > 1.05   < > 0.97  --  1.00 1.12 1.33*  CALCIUM 9.4   < > 9.1   < > 8.9   < > 8.7* 9.1 8.6*  GFRNONAA 58*   < > >60   < > >60  --  >60 >60 50*  GFRAA >60   < > >60   < > >60  --  >60 >60 58*  PROT  8.1  --  8.2*  --  7.4  --   --   --   --   ALBUMIN 3.7  --  4.1  --  3.2*  --   --   --   --   AST 56*  --  69*  --  71*  --   --   --   --   ALT 26  --  27  --  22  --   --   --   --   ALKPHOS 124  --  119  --  117  --   --   --   --   BILITOT 1.0  --  1.2  --  0.7  --   --   --   --    <  > = values in this interval not displayed.    RADIOGRAPHIC STUDIES: I have personally reviewed the radiological images as listed and agreed with the findings in the report. DG Chest Port 1 View  Result Date: 08/14/2019 CLINICAL DATA:  Bilateral feet swelling x3 days, Increasing SOB over last 3 days. Hx Pulm HTN, diabetes, CHF, PAD, MI (17 yrs ago), and A-Fib. EXAM: PORTABLE CHEST 1 VIEW COMPARISON:  Chest radiograph in 05/30/2018, 02/24/2018 FINDINGS: Stable cardiomediastinal contours with enlarged heart size. Surgical changes status post median sternotomy and valvular replacement. The lungs are hyperinflated. The lungs are otherwise clear. No pneumothorax or significant pleural effusion. No acute finding in the visualized skeleton. IMPRESSION: No acute cardiopulmonary process. Electronically Signed   By: Audie Pinto M.D.   On: 08/14/2019 14:59    ASSESSMENT & PLAN: Mr. Revoir is a 79 year old gentleman with recently diagnosed metastatic colon cancer, extensive heart disease, presented with worsening fatigue, anorexia, abdominal pain and body aches, failure to thrive at home.  He is being admitted directly from my office for symptom management, and transition to hospice care.   1.  Metastatic colon cancer 2.  Failure to thrive 3.  Moderate protein and nutrition 4.  Atrial fibrillation, hypertension, coronary artery disease, with congestive heart failure EF 20% 5.  History of stroke 6. DNR  Recommendations: -Patient has agreed DNR/DNI, and ready for hospice care. -I will give him gentle IV hydration with normal saline 50 cc/hR for a total of 500ML today -We will give Zofran and dexamethasone in the clinic -We will place comfort care orders, -I have called palliative care consult for assistance -We have called hospice team, they will evaluate him tomorrow, and decide if he is a candidate for inpatient hospice.   All questions were answered. The patient knows to call the clinic with  any problems, questions or concerns.      Truitt Merle, MD 09/05/2019 2:33 PM

## 2019-09-05 NOTE — Progress Notes (Signed)
[facility])  Mr. Alamo is our current community based palliative care patient.  Noted hospital admission and Dr. Lewayne Bunting recommendation.   Discussed with Dr. Domingo Cocking.  ACC will speak with hospital team and see what disposition plans are leaning towards in the am.  Venia Carbon RN, BSN, [facility] Liaison

## 2019-09-05 NOTE — Progress Notes (Signed)
Pt was seen in my office today and was admitted to [facility] hospital, please see my admission note.  Caleb [name]  09/05/2019

## 2019-09-05 NOTE — Progress Notes (Signed)
Report called to [place], [place]

## 2019-09-05 NOTE — Patient Instructions (Signed)
Dehydration, Adult Dehydration is a condition in which there is not enough water or other fluids in the body. This happens when a person loses more fluids than he or she takes in. Important organs, such as the kidneys, brain, and heart, cannot function without a proper amount of fluids. Any loss of fluids from the body can lead to dehydration. Dehydration can be mild, moderate, or severe. It should be treated right away to prevent it from becoming severe. What are the causes? Dehydration may be caused by:  Conditions that cause loss of water or other fluids, such as diarrhea, vomiting, or sweating or urinating a lot.  Not drinking enough fluids, especially when you are ill or doing activities that require a lot of energy.  Other illnesses and conditions, such as fever or infection.  Certain medicines, such as medicines that remove excess fluid from the body (diuretics).  Lack of safe drinking water.  Not being able to get enough water and food. What increases the risk? The following factors may make you more likely to develop this condition:  Having a long-term (chronic) illness that has not been treated properly, such as diabetes, heart disease, or kidney disease.  Being 67 years of age or older.  Having a disability.  Living in a place that is high in altitude, where thinner, drier air causes more fluid loss.  Doing exercises that put stress on your body for a long time (endurance sports). What are the signs or symptoms? Symptoms of dehydration depend on how severe it is. Mild or moderate dehydration  Thirst.  Dry lips or dry mouth.  Dizziness or light-headedness, especially when standing up from a seated position.  Muscle cramps.  Dark urine. Urine may be the color of tea.  Less urine or tears produced than usual.  Headache. Severe dehydration  Changes in skin. Your skin may be cold and clammy, blotchy, or pale. Your skin also may not return to normal after being  lightly pinched and released.  Little or no tears, urine, or sweat.  Changes in vital signs, such as rapid breathing and low blood pressure. Your pulse may be weak or may be faster than 100 beats a minute when you are sitting still.  Other changes, such as: ? Feeling very thirsty. ? Sunken eyes. ? Cold hands and feet. ? Confusion. ? Being very tired (lethargic) or having trouble waking from sleep. ? Short-term weight loss. ? Loss of consciousness. How is this diagnosed? This condition is diagnosed based on your symptoms and a physical exam. You may have blood and urine tests to help confirm the diagnosis. How is this treated? Treatment for this condition depends on how severe it is. Treatment should be started right away. Do not wait until dehydration becomes severe. Severe dehydration is an emergency and needs to be treated in a hospital.  Mild or moderate dehydration can be treated at home. You may be asked to: ? Drink more fluids. ? Drink an oral rehydration solution (ORS). This drink helps restore proper amounts of fluids and salts and minerals in the blood (electrolytes).  Severe dehydration can be treated: ? With IV fluids. ? By correcting abnormal levels of electrolytes. This is often done by giving electrolytes through a tube that is passed through your nose and into your stomach (nasogastric tube, or NG tube). ? By treating the underlying cause of dehydration. Follow these instructions at home: Oral rehydration solution If told by your health care provider, drink an ORS:  Make  an ORS by following instructions on the package.  Start by drinking small amounts, about  cup (120 mL) every 5-10 minutes.  Slowly increase how much you drink until you have taken the amount recommended by your health care provider. Eating and drinking         Drink enough clear fluid to keep your urine pale yellow. If you were told to drink an ORS, finish the ORS first and then start slowly  drinking other clear fluids. Drink fluids such as: ? Water. Do not drink only water. Doing that can lead to hyponatremia, which is having too little salt (sodium) in the body. ? Water from ice chips you suck on. ? Fruit juice that you have added water to (diluted fruit juice). ? Low-calorie sports drinks.  Eat foods that contain a healthy balance of electrolytes, such as bananas, oranges, potatoes, tomatoes, and spinach.  Do not drink alcohol.  Avoid the following: ? Drinks that contain a lot of sugar. These include high-calorie sports drinks, fruit juice that is not diluted, and soda. ? Caffeine. ? Foods that are greasy or contain a lot of fat or sugar. General instructions  Take over-the-counter and prescription medicines only as told by your health care provider.  Do not take sodium tablets. Doing that can lead to having too much sodium in the body (hypernatremia).  Return to your normal activities as told by your health care provider. Ask your health care provider what activities are safe for you.  Keep all follow-up visits as told by your health care provider. This is important. Contact a health care provider if:  You have muscle cramps, pain, or discomfort, such as: ? Pain in your abdomen and the pain gets worse or stays in one area (localizes). ? Stiff neck.  You have a rash.  You are more irritable than usual.  You are sleepier or have a harder time waking than usual.  You feel weak or dizzy.  You feel very thirsty. Get help right away if you have:  Any symptoms of severe dehydration.  Symptoms of vomiting, such as: ? You cannot eat or drink without vomiting. ? Vomiting gets worse or does not go away. ? Vomit includes blood or green matter (bile).  Symptoms that get worse with treatment.  A fever.  A severe headache.  Problems with urination or bowel movements, such as: ? Diarrhea that gets worse or does not go away. ? Blood in your stool (feces). This  may cause stool to look black and tarry. ? Not urinating, or urinating only a small amount of very dark urine, within 6-8 hours.  Trouble breathing. These symptoms may represent a serious problem that is an emergency. Do not wait to see if the symptoms will go away. Get medical help right away. Call your local emergency services (911 in the [place] Do not drive yourself to the hospital. Summary  Dehydration is a condition in which there is not enough water or other fluids in the body. This happens when a person loses more fluids than he or she takes in.  Treatment for this condition depends on how severe it is. Treatment should be started right away. Do not wait until dehydration becomes severe.  Drink enough clear fluid to keep your urine pale yellow. If you were told to drink an oral rehydration solution (ORS), finish the ORS first and then start slowly drinking other clear fluids.  Take over-the-counter and prescription medicines only as told by your health care  provider.  Get help right away if you have any symptoms of severe dehydration. This information is not intended to replace advice given to you by your health care provider. Make sure you discuss any questions you have with your health care provider. Document Revised: 10/20/2018 Document Reviewed: 10/20/2018 Elsevier Patient Education  [place].

## 2019-09-05 NOTE — Progress Notes (Signed)
Received message from patient's wife Arbie Cookey requesting he be seen by Dr. Burr Medico today.  I scheduled for lab at 1:00 and to see Dr. Burr Medico at 1:20.  I spoke to the patient directly about Hospice and that we feel like it would be a great support and benefit to them at this time.  I believe he is in agreement but will discuss at his visit this afternoon.

## 2019-09-05 NOTE — Progress Notes (Signed)
COMMUNITY PALLIATIVE CARE RN NOTE  PATIENT NAME: Caleb [name] DOB: [birth year] MRN: [id number]  PRIMARY CARE PROVIDER: Hoyt Koch, MD  RESPONSIBLE PARTY:  Acct ID - Guarantor Home Phone Work Phone Relationship Acct Type  [id number] ROCKO, [id number]  Self P/F     [address] [place], [place] [address]   Covid-19 Pre-screening Negative  PLAN OF CARE and INTERVENTION:  1. ADVANCE CARE PLANNING/GOALS OF CARE: Goal is for patient to remain at home with his wife.  2. PATIENT/CAREGIVER EDUCATION: Explained Palliative care services, symptom management, dyspnea and edema management, safe mobility/transfers 3. DISEASE STATUS: Joint visit made with Palliative care SW, Caleb [name]. Met with patient, his wife Caleb [name] and son Caleb [name] in the home. Patient reports intermittent abdominal pain. He is using Tylenol for now to help manage his pain. He is dealing with low sodium levels. Last reading was 128. He is taking salt tablets which causes nausea afterwards. Today he took Zofran 30 minutes prior and ate some cantaloupe which contained the tablet, followed by drinking lime with water. This seemed to help and he denies nausea at this time. His biggest concern he feels is his shortness of breath. He feels that it is profound and causes suffering. He does experience dyspnea with any exertion. He was seen at the Heart failure clinic yesterday to receive an IV infusion. He was placed on supplemental oxygen, but this did not seem to improve his breathing. His oxygen levels are usually in the upper 90s, and is 98% today on room air. He also has pulmonary hypertension. He has extreme fatigue. He remains able to perform ADLs independently, but he requires frequent rest periods and it takes a while. The worst part of his day seems to be in the mornings where at times he is unable to get out of bed. He is sleeping later into the day. His appetite is slowly diminishing. He reports early satiety and food tasting  different. He is continent of both bowel and bladder, but does have issues with constipation at times. Last BM was yesterday, but took a lot of effort even after using a Dulcolax suppository. Recommended adding Senna S to his medication regimen to help. He has pitting pedal edema and prior to end of visit left the table to sit in his recliner in order to elevate his feet. We discussed caregiver/sitter options and encouraged wife to request assistance. She reports that she is tired and not sleeping well. Patient is agreeable to future visits from Palliative care. Will continue to monitor.   HISTORY OF PRESENT ILLNESS:  This is a 79 yo male with a diagnosis of colorectal cancer with mets to the liver. Palliative care has been asked to follow patient for additional support. Will visit patient monthly and PRN.  CODE STATUS: DNR  ADVANCED DIRECTIVES: N MOST FORM: no PPS: 50%   PHYSICAL EXAM:   VITALS: Today's Vitals   08/25/19 1327  BP: (!) 100/47  Pulse: 68  Resp: 20  Temp: 97.6 F (36.4 C)  TempSrc: Temporal  SpO2: 98%  PainSc: 2   PainLoc: Abdomen    LUNGS: clear to auscultation  CARDIAC: Cor RRR EXTREMITIES: pitting edema bilateral lower extremities SKIN: Exposed skin is dry and intact  NEURO: Alert and oriented x 3, increased generalized weakness, ambulatory   (Duration of visit and documentation 90 minutes)   Daryl Eastern, RN BSN

## 2019-09-06 ENCOUNTER — Other Ambulatory Visit (HOSPITAL_COMMUNITY): Payer: Self-pay | Admitting: Cardiology

## 2019-09-06 DIAGNOSIS — I429 Cardiomyopathy, unspecified: Secondary | ICD-10-CM

## 2019-09-06 NOTE — Progress Notes (Signed)
Palliative care progress note  Reason for visit: Goals of care and symptom management in light of metastatic colon cancer and advanced heart failure  I met today with [name], his wife, and a friend Caleb [name]) at the bedside today.  We discussed first symptoms and he reports that he did have a bowel movement is feeling better today.  He currently displays uncontrolled pain, shortness of breath, nausea, constipation.  We spent a good deal of time discussing care plan and hospice benefits.  At this point, the major consideration is for what will be plan for medical management of his advanced heart failure on discharge home with hospice.  He is currently taking several medications that I am concerned would not be on hospice formulary.  We discussed plan for hospice to hopefully weigh in on what their recommendations would be for medical management of his heart failure.  Per patient's wife, Dr. Burr Medico is also planning to reach out to [name] for his input as well.  We discussed that if he desires to stay on his current heart failure medications and they are not covered by hospice, they may decide to pay for these medications out-of-pocket to keep him on the regimen that has done best for him at home.  -Caleb [name] is still interested in electing his hospice benefits on discharge from the hospital.  Referral was placed with your care collective and they will meet with him and family later today to discuss home hospice. -From a symptom standpoint, Caleb [name] is feeling fairly well today per his report.  Continue same management. -The largest barrier at this point in time is developing plan for his heart failure on discharge.  He is currently receiving digoxin as well as Entresto, but his home diuretics are currently being held as he came in dehydrated.  We will continue to work toward discharge home with hospice while also awaiting input from hospice and [name] regarding what may be the best regimen for his  heart failure with goal of being home with the support of hospice but continuing to feel as well as he can for as long as possible.  Total time: 25 minutes  Greater than 50%  of this time was spent counseling and coordinating care related to the above assessment and plan.  Micheline Rough, MD [facility] [phone]

## 2019-09-06 NOTE — Consult Note (Signed)
Palliative care consult note  Reason for consult: Goals of care and symptom management in light of metastatic colon cancer and advanced heart failure  Palliative care consult received.  Chart reviewed including personal review of pertinent labs and imaging.    Briefly, [name] is an 79 year old male with past medical history of recently diagnosed metastatic colon cancer, advanced heart failure (EF 20%), coronary disease, A. fib, hypertension, pulmonary hypertension and stroke who presented to outpatient oncology office with worsened shortness of breath, fatigue, abdominal pain, weakness, and constipation.  He was evaluated by Dr. Burr Medico and admitted to the hospital for worsening symptoms with a goal of improving symptom management and transitioning to hospice care.  I met today with [name] and his wife, Caleb [name].  He is a very pleasant gentleman with good insight into his overall situation medical conditions.  He is a retired Investment banker, operational.  We discussed clinical course as well as wishes moving forward in regard to  care plan in light of his advanced cancer and increasing symptom burden.  Concepts specific to code status and benefits and burdens of hospitalization discussed.  We discussed difference between a aggressive medical intervention path and a palliative, comfort focused care path.  Values and goals of care important to patient and family were attempted to be elicited.  At this point, [name] reports that he is interested in hospice services to ensure that he does not suffer moving forward.  We discussed options for hospice care including home hospice, hospice services in a long-term care facility, and residential hospice.  He reports that his desire is to be at home for at least a period of time and that he would like to work to transition home with the support of hospice.  Discussed the typical benefits that hospice provides in the home and does not provide 24-hour in-home care.   He and his wife would like time to further discuss, however, they have been working to hire more private duty help and he believes that a combination of this and hospice support at home may allow him to be in his own home for a little while longer.  When he reaches a point where being at home, even with the support of hospice, becomes too much of a burden his goal would be to transition to residential hospice.  I have called and discussed with [facility] collective who follows him as an outpatient.  I requested a follow-up tomorrow to discuss further regarding hospice options and help determine if he would best be served by working transition home with hospice support versus consideration for residential hospice.    We also discussed working toward aggressive symptom management moving forward.  We discussed symptoms of pain, anxiety, shortness of breath, nausea, constipation, and insomnia.  At this point in time, he is very much focused on constipation as being the predominant thing that is causing him distress.  We discussed options for management of symptoms moving forward.  He has been trying to avoid taking any opioids as he knows this will worsen his constipation.  He does have morphine ordered on an as-needed basis and I encouraged him to take it if pain becomes uncontrolled.  We also discussed his nausea and how Zofran can be a constipating medication as well.  He will continue to take antiemetics as needed for symptoms, however, he feels as though he is sometimes given them without really needing them and wants to wait to see if his symptoms recur prior to  taking any more antiemetics.  When he does need further medication for nausea, he will try Compazine rather than Zofran to see if it is more effective and may be less constipating for him.  Just prior to my encounter, he had a Dulcolax suppository and took some MiraLAX.  I will also add on some Senokot nightly to see if this helps as well.  At this  point, he is not interested in an enema, however, he would like 1 to be ordered in case he wants to do it later this evening.  He also reports that he is concerned about giving up his remote monitoring for his heart failure when he enrolls in hospice.  He understands his prognosis is limited, however, he is worried about stopping all of his heart failure medications.  He specifically asked to restart his Entresto and digoxin.  We also discussed benefits and burdens of diuretics, particularly based upon the fact that he appears to be a little dehydrated and we are working to hydrate him at this point in time.  I did restart his Entresto and digoxin this evening per his request, and will plan to discuss further with hospice regarding care plan for his heart failure moving forward.  -[name] would like to elect his hospice benefits moving forward.  I have placed a referral to [facility] whom follows with him as an outpatient through their palliative care program.  He tells me this evening that his goal is to be back in his own home with hospice support but he is open to the idea of residential hospice when his care needs become too great to be at home.  Requested hospice to further differentiate best care venue for him on discharge from the hospital. -From a symptom standpoint, [name] is most concerned with constipation at this point.  We will try to minimize constipating medications, addition of Senokot as well as continuation of MiraLAX, and enema as needed this evening. -We will need further guidance from hospice on management of his heart failure moving forward.  I did restart his digoxin and Entresto per his request.  I did not restart diuretics at this point as we are working to give him some gentle hydration due to concern for dehydration.  On discharge, would recommend scripts for: - Morphine Concentrate 80m/0.5ml: 534m(0.2539msublingual every 1 hour as needed for pain or shortness of breath:  Disp 34m14mLorazepam 2mg/13mconcentrated solution: 1mg (61mml) s64mingual every 4 hours as needed for anxiety: Disp 34ml - 35mol 2mg/ml s23mtion: 0.5mg (0.2574m subl61mal every 4 hours as needed for agitation or nausea: Disp 34ml  - Sen57mdes 8.8MG/5ML syrup: 10mL daily a20mdtime - Miralax 17g oral daily prn constipation   Start time:1800 End time:1900 Total time: 60 minutes  Greater than 50%  of this time was spent counseling and coordinating care related to the above assessment and plan.  Chardonnay Holzmann,Micheline Roughlth [phone]

## 2019-09-06 NOTE — Progress Notes (Signed)
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 he did not sleep well last night, and did not want to take Ambien due to the concern of side effects. Still has bilateral leg edema. No significant dyspnea at rest. He was sitting in recliner with legs elevated when I saw him around noon.   Objective:  Vitals:   09/06/19 0645 09/06/19 1156  BP: (!) 107/47 (!) 96/47  Pulse: 71 65  Resp: 20 20  Temp: 98.1 F (36.7 C) 97.6 F (36.4 C)  SpO2: 98% 99%    There is no height or weight on file to calculate BMI.  Intake/Output Summary (Last 24 hours) at 09/06/2019 2141 Last data filed at 09/06/2019 0900 Gross per 24 hour  Intake 120 ml  Output --  Net 120 ml     Sclerae unicteric  Oropharynx clear  No peripheral adenopathy  Lungs clear -- no rales or rhonchi  Heart regular rate and rhythm  Abdomen benign  MSK no focal spinal tenderness, no peripheral edema  Neuro nonfocal    CBG (last 3)  No results for input(s): GLUCAP in the last 72 hours.   Labs:  Urine Studies No results for input(s): UHGB, CRYS in the last 72 hours.  Invalid input(s): UACOL, UAPR, USPG, UPH, UTP, UGL, UKET, UBIL, UNIT, UROB, ULEU, UEPI, UWBC, URBC, UBAC, CAST, UCOM, BILUA  Basic Metabolic Panel: Recent Labs  Lab 09/03/19 1430 09/05/19 1412  NA 129* 131*  K 4.5 4.5  CL 88* 91*  CO2 27 28  GLUCOSE 69* 96  BUN 23 24*  CREATININE 1.33* 1.44*  CALCIUM 8.6* 8.8*   GFR Estimated Creatinine Clearance: 37.5 mL/min (A) (by C-G formula based on SCr of 1.44 mg/dL (H)). Liver Function Tests: Recent Labs  Lab 09/05/19 1412  AST 94*  ALT 42  ALKPHOS 254*  BILITOT 1.6*  PROT 7.3  ALBUMIN 3.0*   No results for input(s): LIPASE, AMYLASE in the last 168 hours. No results for input(s): AMMONIA in the last 168 hours. Coagulation profile No  results for input(s): INR, PROTIME in the last 168 hours.  CBC: Recent Labs  Lab 09/03/19 1430 09/05/19 1412  WBC SPECIMEN CLOTTED 7.9  NEUTROABS SPECIMEN CLOTTED 5.7  HGB SPECIMEN CLOTTED 11.5*  HCT SPECIMEN CLOTTED 34.8*  MCV SPECIMEN CLOTTED 89.7  PLT SPECIMEN CLOTTED 293   Cardiac Enzymes: No results for input(s): CKTOTAL, CKMB, CKMBINDEX, TROPONINI in the last 168 hours. BNP: Invalid input(s): POCBNP CBG: No results for input(s): GLUCAP in the last 168 hours. D-Dimer No results for input(s): DDIMER in the last 72 hours. Hgb A1c No results for input(s): HGBA1C in the last 72 hours. Lipid Profile No results for input(s): CHOL, HDL, LDLCALC, TRIG, CHOLHDL, LDLDIRECT in the last 72 hours. Thyroid function studies No results for input(s): TSH, T4TOTAL, T3FREE, THYROIDAB in the last 72 hours.  Invalid input(s): FREET3 Anemia work up No results for input(s): VITAMINB12, FOLATE, FERRITIN, TIBC, IRON, RETICCTPCT in the last 72 hours. Microbiology Recent Results (from the past 240 hour(s))  SARS Coronavirus 2 (TAT 6-24 hrs)     Status: None   Collection Time: 09/05/19  2:00 PM   Specimen: Nasopharyngeal Swab  Result Value Ref Range Status   SARS Coronavirus 2 NEGATIVE NEGATIVE Final    Comment: (NOTE) SARS-CoV-2 target nucleic acids are NOT  DETECTED.  The SARS-CoV-2 RNA is generally detectable in upper and lower respiratory specimens during the acute phase of infection. Negative results do not preclude SARS-CoV-2 infection, do not rule out co-infections with other pathogens, and should not be used as the sole basis for treatment or other patient management decisions. Negative results must be combined with clinical observations, patient history, and epidemiological information. The expected result is Negative.  Fact Sheet for Patients: SugarRoll.be  Fact Sheet for Healthcare Providers: https://www.woods-mathews.com/  This test  is not yet approved or cleared by the [place] FDA and  has been authorized for detection and/or diagnosis of SARS-CoV-2 by FDA under an Emergency Use Authorization (EUA). This EUA will remain  in effect (meaning this test can be used) for the duration of the COVID-19 declaration under Se ction 564(b)(1) of the Act, 21 U.S.C. section 360bbb-3(b)(1), unless the authorization is terminated or revoked sooner.  Performed at [facility], [place] [address] [place], [place] [address]       Studies:  No results found.  Assessment: 79 y.o.  Mr. Novosel is a 79 year old gentleman with recently diagnosed metastatic colon cancer, extensive heart disease, presented with worsening fatigue, anorexia, abdominal pain and body aches, failure to thrive at home.  He is being admitted directly from my office for symptom management, and transition to hospice care.   1.  Metastatic colon cancer 2.  Failure to thrive 3.  Moderate protein and nutrition 4.  Atrial fibrillation, hypertension, coronary artery disease, with congestive heart failure EF 20% 5.  History of stroke 6. DNR 7. B/l leg edema   Plan:  -he overall improved some with gentle hydration and symptom management. I have stopped his IV fluids today, and encourage him to drink about 20-30oz a day.  -he would like to go home, his wife has arranged home aids (about 4 hours a day 3-4 days a week), she thinks she will be able to handle it. In this case, I will plan to discharge him with home hospice service. -He has met hospice service liaison yesterday, hospice will unlikely cover his cardiac medication (digoxin and Entresto). I were discussed with his cardiologist Dr. Algernon Huxley to see if he still recommends patient to continue this medications when he is under hospice care, or if he would recommend alternative medicines which are safer and cheaper.  -I appreciate palliative care team Dr. Kirstie Mirza excellent care  -Patient will use home  medication melatonin for insomnia tonight, I'll also add low-dose Benadryl as needed for insomnia also -Plan to discharge him home with hospice tomorrow if it is set up.  Truitt Merle  09/06/2019

## 2019-09-06 NOTE — Progress Notes (Signed)
Manufacturing engineer [facility])  Received request from [facility] for hospice services at home after discharge.  Chart and pt information reviewed by [facility] physician; patient is deemed eligible for hospice services.    Hospital liaison spoke with patient and Mrs. Word at bedside to initiate education related to hospice philosophy and services and to answer any questions at this time. Both verbalized understanding of information given.  Per discussion the planis, at time of discharge, to initiate hospice services.  Pease send signed and completed DNR home with pt/family.  Please provide prescriptions at discharge as needed to ensure ongoing symptom management.    DME needs discussed. Family denies any DME needs at this time.  Address has been verified and is correct in the chart.  Above information shared with Jorene Guest Manager.  Please call with any questions or concerns.  Thank you for the opportunity to participate in this patient's care.  Domenic Moras, BSN, RN [facility] [phone] (24h on call)

## 2019-09-06 NOTE — Progress Notes (Signed)
Pt was awake and alert when I arrived; his wife was bedside. They expressed that today was a better day than yesterday. Both were tearful during our conversation. They shared that they are spiritual but not religious.  He asked about my perspective regarding death and dying. We talked about Old Testament stories about how the patriots called in their families and announced their departure in some cases and how Jesus announced his death as laying down his life and we talked about the potential application. His wife tearfully told him she would release him when he is ready. We talked about how it is more difficult to leave our loved ones. 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 He shared his origin as a person who was adopted and when he found his father as an adult his father did not want to deal with it. He was very forthcoming in talking about his journey of life. Our visit was interrupted by a phone call and I told them I would return. As of this writing he was still on the call. I will follow-up with a visit at a more convenient time. Please page if assistance is needed prior to that time. [place], MDiv   09/06/19 1900  Clinical Encounter Type  Visited With Patient and family together

## 2019-09-06 NOTE — Progress Notes (Signed)
The patient was seen in infusion for COVID-19 testing prior to a planned admission.  A specimen was collected and submitted to the lab.  Sandi Mealy, MHS, PA-C Physician Assistant

## 2019-09-07 ENCOUNTER — Telehealth: Payer: Self-pay

## 2019-09-07 MED ORDER — LORAZEPAM 2 MG/ML PO CONC: 0.5000 mg | ORAL | 0 refills | Status: AC | PRN

## 2019-09-07 MED ORDER — HYDROXYZINE HCL 25 MG PO TABS: 25.0000 mg | ORAL_TABLET | Freq: Three times a day (TID) | ORAL | 0 refills | Status: DC | PRN

## 2019-09-07 MED ORDER — HALOPERIDOL LACTATE 2 MG/ML PO CONC
0.5000 mg | ORAL | 0 refills | Status: DC | PRN
Start: 2019-09-07 — End: 2019-09-14

## 2019-09-07 MED ORDER — POLYETHYLENE GLYCOL 3350 17 G PO PACK: 17.0000 g | PACK | Freq: Four times a day (QID) | ORAL | 0 refills | Status: DC | PRN

## 2019-09-07 MED ORDER — ONDANSETRON 8 MG PO TBDP: 8.0000 mg | ORAL_TABLET | Freq: Three times a day (TID) | ORAL | 0 refills | Status: DC | PRN

## 2019-09-07 MED ORDER — ACETAMINOPHEN 160 MG/5ML PO SOLN: 500.0000 mg | Freq: Four times a day (QID) | ORAL | 0 refills | Status: DC | PRN

## 2019-09-07 MED ORDER — MORPHINE SULFATE (CONCENTRATE) 10 MG /0.5 ML PO SOLN: 10.0000 mg | Freq: Two times a day (BID) | ORAL | 0 refills | Status: DC | PRN

## 2019-09-07 MED ORDER — TORSEMIDE 20 MG PO TABS: ORAL_TABLET | ORAL | 2 refills | Status: DC

## 2019-09-07 MED ORDER — PROCHLORPERAZINE MALEATE 10 MG PO TABS: 10.0000 mg | ORAL_TABLET | Freq: Four times a day (QID) | ORAL | 0 refills | Status: AC | PRN

## 2019-09-07 MED ORDER — SENNOSIDES 8.8 MG/5ML PO SYRP: 10.0000 mL | ORAL_SOLUTION | Freq: Every day | ORAL | 0 refills | Status: DC

## 2019-09-07 MED ORDER — BISACODYL 10 MG RE SUPP: 10.0000 mg | Freq: Four times a day (QID) | RECTAL | 0 refills | Status: DC | PRN

## 2019-09-07 MED FILL — MORPHINE SULFATE (CONCENTRA: 20 | 30 days supply | Qty: 30 | Fill #0

## 2019-09-07 MED FILL — PROCHLORPERAZINE 10 MG TAB: 10 | 7 days supply | Qty: 30 | Fill #0

## 2019-09-07 MED FILL — LORAZEPAM 2 MG/ML CONC: 2 | 16 days supply | Qty: 30 | Fill #0

## 2019-09-07 MED FILL — hydrOXYzine HCL 25 MG TABS: 25 | 10 days supply | Qty: 30 | Fill #0

## 2019-09-07 MED FILL — ONDANSETRON ODT 8 MG TABLET: 8 | 6 days supply | Qty: 20 | Fill #0

## 2019-09-07 MED FILL — ENTRESTO 24 MG-26 MG TABLET: 24-26 | 30 days supply | Qty: 60 | Fill #0

## 2019-09-07 MED FILL — HALOPERIDOL LAC 2 MG/ML CON: 2 | 16 days supply | Qty: 30 | Fill #0

## 2019-09-07 NOTE — Discharge Summary (Addendum)
Discharge Summary  Patient ID: Caleb [name] MRN: [id number] DOB/AGE: 12/17/1940 79 y.o.  Admit date: 09/05/2019 Discharge date: 09/07/2019  Discharge Diagnoses:  Active Problems:   Goals of care, counseling/discussion   Colorectal cancer, stage IV ([place])   Metastatic colon cancer to liver [facility])   Discharged Condition: stable  Discharge Labs:    CBC    Component Value Date/Time   WBC 7.9 09/05/2019 1412   WBC SPECIMEN CLOTTED 09/03/2019 1430   RBC 3.88 (L) 09/05/2019 1412   HGB 11.5 (L) 09/05/2019 1412   HCT 34.8 (L) 09/05/2019 1412   PLT 293 09/05/2019 1412   MCV 89.7 09/05/2019 1412   MCH 29.6 09/05/2019 1412   MCHC 33.0 09/05/2019 1412   RDW 17.9 (H) 09/05/2019 1412   LYMPHSABS 1.2 09/05/2019 1412   MONOABS 0.8 09/05/2019 1412   EOSABS 0.2 09/05/2019 1412   BASOSABS 0.0 09/05/2019 1412   CMP Latest Ref Rng & Units 09/05/2019 09/03/2019 08/24/2019  Glucose 70 - 99 mg/dL 96 69(L) 117(H)  BUN 8 - 23 mg/dL 24(H) 23 29(H)  Creatinine 0.61 - 1.24 mg/dL 1.44(H) 1.33(H) 1.12  Sodium 135 - 145 mmol/L 131(L) 129(L) 128(L)  Potassium 3.5 - 5.1 mmol/L 4.5 4.5 3.2(L)  Chloride 98 - 111 mmol/L 91(L) 88(L) 86(L)  CO2 22 - 32 mmol/L _0 Calcium 8.9 - 10.3 mg/dL 8.8(L) 8.6(L) 9.1  Total Protein 6.5 - 8.1 g/dL 7.3 - -  Total Bilirubin 0.3 - 1.2 mg/dL 1.6(H) - -  Alkaline Phos 38 - 126 U/L 254(H) - -  AST 15 - 41 U/L 94(H) - -  ALT 0 - 44 U/L 42 - -    Consults: Palliative Care  Procedures: None  Disposition:  Discharge disposition: 01-Home or Self Care      Allergies as of 09/07/2019      Reactions   Carvedilol Other (See Comments)   Dizziness, uneasiness, alopecia   Codeine    Rapid thoughts, hallucinations   Opsumit [macitentan] Swelling   Per patient, caused extreme ankle swelling.    Dabigatran Rash   Pradaxa [dabigatran Etexilate Mesylate] Rash   Sulfonamide Derivatives Rash   Xarelto [rivaroxaban] Rash      Medication List    STOP taking these  medications   ascorbic acid 1000 MG tablet Commonly known as: VITAMIN C   Cod Liver Oil 5000-500 UNIT/5ML Oil   eplerenone 25 MG tablet Commonly known as: INSPRA   HYDROcodone-Acetaminophen 5-300 MG Tabs   magnesium gluconate 500 MG tablet Commonly known as: MAGONATE   metolazone 2.5 MG tablet Commonly known as: ZAROXOLYN   MULTIVITAMIN ADULTS PO   potassium chloride SA 20 MEQ tablet Commonly known as: KLOR-CON     TAKE these medications   acetaminophen 160 MG/5ML solution Commonly known as: TYLENOL Take 15.6 mLs (500 mg total) by mouth every 6 (six) hours as needed for moderate pain or fever.   bisacodyl 10 MG suppository Commonly known as: DULCOLAX Place 1 suppository (10 mg total) rectally 4 (four) times daily as needed for moderate constipation.   digoxin 0.125 MG tablet Commonly known as: Lanoxin Take 1 tablet (0.125 mg total) by mouth daily.   haloperidol 2 MG/ML solution Commonly known as: HALDOL Take 0.3 mLs (0.6 mg total) by mouth every 4 (four) hours as needed for agitation (Sleep).   hydrOXYzine 25 MG tablet Commonly known as: ATARAX/VISTARIL Take 1 tablet (25 mg total) by mouth 3 (three) times daily as needed for itching (hiccups).  LORazepam 2 MG/ML concentrated solution Commonly known as: ATIVAN Take 0.3 mLs (0.6 mg total) by mouth every 4 (four) hours as needed for anxiety.   morphine CONCENTRATE 10 mg / 0.5 ml concentrated solution Take 0.5 mLs (10 mg total) by mouth every 12 (twelve) hours as needed for severe pain or shortness of breath.   ondansetron 8 MG disintegrating tablet Commonly known as: ZOFRAN-ODT Take 1 tablet (8 mg total) by mouth every 8 (eight) hours as needed for nausea or vomiting.   polyethylene glycol 17 g packet Commonly known as: MIRALAX / GLYCOLAX Take 17 g by mouth 4 (four) times daily as needed for mild constipation.   prochlorperazine 10 MG tablet Commonly known as: COMPAZINE Take 1 tablet (10 mg total) by mouth  every 6 (six) hours as needed for nausea or vomiting.   sacubitril-valsartan 24-26 MG Commonly known as: ENTRESTO Take 1 tablet by mouth 2 (two) times daily.   sennosides 8.8 MG/5ML syrup Commonly known as: SENOKOT Take 10 mLs by mouth at bedtime.   torsemide 20 MG tablet Commonly known as: DEMADEX Take 1.5 tablets (30 mg total) by mouth every morning AND 0.5 tablets (10 mg total) every evening. What changed: See the new instructions.       HPI:  Caleb [name] is a 79 year old gentleman gentleman with recently diagnosed metastatic colon cancer, extensive heart disease, presented with worsening fatigue, anorexia, abdominal pain and body aches, failure to thrive at home.  He is being admitted directly from my office for symptom management, and transition to hospice care.  He was diagnosed with metastatic colon cancer to liver and lymph nodes in March 2021, palliative chemo was discussed offered to him initially, he declined.  He has history of coronary artery disease, A. fib, hypertension, congestive heart failure with EF 20%, pulmonary hypertension, history of stroke.  He has been falling with his cardiologist Dr. Algernon [name] closely.  He has developed worsening shortness of breath on exertion in the past few months, was seen in the ED on Aug 14, 2019, and have tried intravenous diuretics at home, without significant improvement.  He has gradually developed worsening fatigue, intermittent abdominal pain in the right upper quadrant, and body aches since his cancer diagnosis.  His symptom has been much worse in the past week, to the point it was difficult for him to ambulate at home since yesterday.  He denies any fever or chills, productive cough, or dysuria.  I have previously referred him to palliative care, and strongly encouraged him to consider hospice he previously declined.  He came in today due to the worsening symptoms, seen in my office, with stable vital signs.  Goal of care discussed again, he is  ready for hospice.  I will admit him to hospital for symptom management and transition him to hospice care.  Hospital Course:  Caleb [name] was directly admitted to the hospital from the cancer center.  On the day admission, he was seen by the hospice liaison as well as the palliative care team.  The patient opted to elect his hospice benefits moving forward.  The patient's goal was to return home with hospice support but is open to the idea of residential hospice when his care needs become too great to be at home.  His biggest issue during hospitalization was constipation.  Senokot and MiraLAX were added.  Additional recommendations for symptom control were also made by palliative care which we appreciate.  He was seen by [facility] hospice liaison on 09/06/2019 with  plans to discharged home on 09/07/2019.  The patient denied need for DME.  The patient is a DO NOT RESUSCITATE and out of facility DNR form was signed.  The patient would like to continue some of his cardiac medications.  We have reached out to his primary cardiologist who has recommended continuation of Entresto, digoxin, and to reduce torsemide dose by 50%.  Gust with hospice liaison who states that hospice will cover his digoxin and torsemide but will not cover the [facility].  The patient refilled his Entresto yesterday so has at least a 30-day supply at home.  His cardiologist also indicated that he could contact their office and they may have samples to provide to him.  The patient was seen on the morning of discharge and reported feeling better with the exception of fatigue/weakness.  Constipation improved during hospitalization.  The patient agrees to discharge to home with hospice today.  Exam:  BP (!) 102/47    Pulse 70    Temp 98.2 F (36.8 C) (Oral)    Resp 16    SpO2 96%     Sclerae unicteric             Oropharynx clear             No peripheral adenopathy             Lungs clear -- no rales or rhonchi              Heart regular rate and rhythm             Abdomen benign             MSK no focal spinal tenderness, no peripheral edema             Neuro nonfocal  Discharge Instructions    Diet - low sodium heart healthy   Complete by: As directed    Increase activity slowly   Complete by: As directed       Signed: Mikey Bussing 09/07/2019, 10:03 AM   Addendum   I have seen the patient, examined him. I agree with the assessment and and plan and have edited the notes.   I strongly encouraged pt to have a walker (I spoke with case manager Alinda Sierras today and she will arrange), hospital bed and wheelchair at home which will be arranged by hospice. He agreed. Alinda Sierras will communicate with hospice program. Pt had BM after enema today, and was discharged home in stable condition. Hospice will meet them at home tomorrow. Medication reviewed with pt and his wife. He knows to call me if needed.   Truitt Merle  09/07/2019

## 2019-09-07 NOTE — Telephone Encounter (Signed)
Geri with [facility] calling and states that the patient is getting discharged from the hospital and would like to know if Dr Sharlet Salina would be the attending? Please advise. CB#: [phone]

## 2019-09-07 NOTE — Progress Notes (Signed)
Palliative care progress note  Reason for visit: Goals of care and symptom management in light of metastatic colon cancer and advanced heart failure  I met today with Caleb [name] and his wife today.  Reports that he wishes he would have a bowel movement before he leaves.Marland Kitchen  He currently denies uncontrolled pain, shortness of breath, nausea, constipation.  Reviewed plan for heart failure medications on discharge.  Also briefly discussed other symptom management needs.  His wife and stated that she remains very concerned about the amount of help that she is going to have a need when she returns home.  She expressed that she wants to talk further with hospice about what she needs to be arranging at home.  We began to have some discussion regarding plans to transition home, but she then received call from hospice agency.  I excused myself at that point so she could speak with hospice agency on the phone as they will be better able to answer her questions about plan whenever he returns home.  -Overall goal remains transition home with hospice support.  His wife reports having many questions about logistics of going home with hospice.  While I was in the room, she received phone call from hospice agency and I stepped out so she can discuss concerns and questions further with hospice agency. -From a symptom standpoint, Caleb [name] is feeling fairly well today per his report.  He would like to have a bowel movement before he leaves.  Enema ordered as needed. Continue same management. -Dr. Burr Medico reached out to Dr. Algernon Huxley regarding plan for heart failure management on discharge.  Plan will be to continue Entresto and digoxin at current dose and continue torsemide at 50% of current dose.  I discussed with hospice liaison this morning and they are aware of this discharge regimen for his heart failure.  Total time: 15 minutes  Greater than 50%  of this time was spent counseling and coordinating care related to the  above assessment and plan.  Micheline Rough, MD [place] Team [phone]

## 2019-09-07 NOTE — TOC Transition Note (Signed)
Transition of Care [facility]) - CM/SW Discharge Note   Patient Details  Name: Caleb [name] MRN: [id number] Date of Birth: 1940-08-27  [facility]) CM/SW Contact:  Lynnell Catalan, RN Phone Number: 09/07/2019, 12:02 PM   Clinical Narrative:     Pt to dc home with hospice today. Yellow DNR signed and to be sent home with patient. ACC to contact wife to set up time for home visit.

## 2019-09-07 NOTE — Telephone Encounter (Signed)
Roland Earl has been informed that PCP will be the attending.

## 2019-09-08 DIAGNOSIS — Z8673 Personal history of transient ischemic attack (TIA), and cerebral infarction without residual deficits: Secondary | ICD-10-CM | POA: Diagnosis not present

## 2019-09-08 DIAGNOSIS — D630 Anemia in neoplastic disease: Secondary | ICD-10-CM | POA: Diagnosis not present

## 2019-09-08 DIAGNOSIS — Z6820 Body mass index (BMI) 20.0-20.9, adult: Secondary | ICD-10-CM | POA: Diagnosis not present

## 2019-09-08 DIAGNOSIS — C19 Malignant neoplasm of rectosigmoid junction: Secondary | ICD-10-CM | POA: Diagnosis not present

## 2019-09-08 DIAGNOSIS — C787 Secondary malignant neoplasm of liver and intrahepatic bile duct: Secondary | ICD-10-CM | POA: Diagnosis not present

## 2019-09-08 DIAGNOSIS — R627 Adult failure to thrive: Secondary | ICD-10-CM | POA: Diagnosis not present

## 2019-09-08 DIAGNOSIS — E46 Unspecified protein-calorie malnutrition: Secondary | ICD-10-CM | POA: Diagnosis not present

## 2019-09-08 DIAGNOSIS — N183 Chronic kidney disease, stage 3 unspecified: Secondary | ICD-10-CM | POA: Diagnosis not present

## 2019-09-08 DIAGNOSIS — I2510 Atherosclerotic heart disease of native coronary artery without angina pectoris: Secondary | ICD-10-CM | POA: Diagnosis not present

## 2019-09-08 DIAGNOSIS — I272 Pulmonary hypertension, unspecified: Secondary | ICD-10-CM | POA: Diagnosis not present

## 2019-09-08 DIAGNOSIS — I509 Heart failure, unspecified: Secondary | ICD-10-CM | POA: Diagnosis not present

## 2019-09-08 DIAGNOSIS — I4891 Unspecified atrial fibrillation: Secondary | ICD-10-CM | POA: Diagnosis not present

## 2019-09-08 DIAGNOSIS — I130 Hypertensive heart and chronic kidney disease with heart failure and stage 1 through stage 4 chronic kidney disease, or unspecified chronic kidney disease: Secondary | ICD-10-CM | POA: Diagnosis not present

## 2019-09-08 DIAGNOSIS — C7989 Secondary malignant neoplasm of other specified sites: Secondary | ICD-10-CM | POA: Diagnosis not present

## 2019-09-11 DIAGNOSIS — C787 Secondary malignant neoplasm of liver and intrahepatic bile duct: Secondary | ICD-10-CM | POA: Diagnosis not present

## 2019-09-11 DIAGNOSIS — E46 Unspecified protein-calorie malnutrition: Secondary | ICD-10-CM | POA: Diagnosis not present

## 2019-09-11 DIAGNOSIS — C7989 Secondary malignant neoplasm of other specified sites: Secondary | ICD-10-CM | POA: Diagnosis not present

## 2019-09-11 DIAGNOSIS — R627 Adult failure to thrive: Secondary | ICD-10-CM | POA: Diagnosis not present

## 2019-09-11 DIAGNOSIS — D630 Anemia in neoplastic disease: Secondary | ICD-10-CM | POA: Diagnosis not present

## 2019-09-11 DIAGNOSIS — C19 Malignant neoplasm of rectosigmoid junction: Secondary | ICD-10-CM | POA: Diagnosis not present

## 2019-09-12 ENCOUNTER — Other Ambulatory Visit: Payer: Self-pay | Admitting: Medical

## 2019-09-12 DIAGNOSIS — D630 Anemia in neoplastic disease: Secondary | ICD-10-CM | POA: Diagnosis not present

## 2019-09-12 DIAGNOSIS — E46 Unspecified protein-calorie malnutrition: Secondary | ICD-10-CM | POA: Diagnosis not present

## 2019-09-12 DIAGNOSIS — C7989 Secondary malignant neoplasm of other specified sites: Secondary | ICD-10-CM | POA: Diagnosis not present

## 2019-09-12 DIAGNOSIS — C19 Malignant neoplasm of rectosigmoid junction: Secondary | ICD-10-CM | POA: Diagnosis not present

## 2019-09-12 DIAGNOSIS — C787 Secondary malignant neoplasm of liver and intrahepatic bile duct: Secondary | ICD-10-CM | POA: Diagnosis not present

## 2019-09-12 DIAGNOSIS — R627 Adult failure to thrive: Secondary | ICD-10-CM | POA: Diagnosis not present

## 2019-09-12 MED ORDER — TRAMADOL HCL 50 MG PO TABS: ORAL_TABLET | ORAL | 0 refills | Status: DC

## 2019-09-12 MED FILL — traMADol HCL 50 MG TABS: 50 | 12 days supply | Qty: 90 | Fill #0

## 2019-09-13 DIAGNOSIS — D630 Anemia in neoplastic disease: Secondary | ICD-10-CM | POA: Diagnosis not present

## 2019-09-13 DIAGNOSIS — C787 Secondary malignant neoplasm of liver and intrahepatic bile duct: Secondary | ICD-10-CM | POA: Diagnosis not present

## 2019-09-13 DIAGNOSIS — C7989 Secondary malignant neoplasm of other specified sites: Secondary | ICD-10-CM | POA: Diagnosis not present

## 2019-09-13 DIAGNOSIS — C19 Malignant neoplasm of rectosigmoid junction: Secondary | ICD-10-CM | POA: Diagnosis not present

## 2019-09-13 DIAGNOSIS — E46 Unspecified protein-calorie malnutrition: Secondary | ICD-10-CM | POA: Diagnosis not present

## 2019-09-13 DIAGNOSIS — R627 Adult failure to thrive: Secondary | ICD-10-CM | POA: Diagnosis not present

## 2019-09-14 ENCOUNTER — Other Ambulatory Visit: Payer: Self-pay

## 2019-09-14 ENCOUNTER — Ambulatory Visit (HOSPITAL_COMMUNITY)
Admission: RE | Admit: 2019-09-14 | Discharge: 2019-09-14 | Disposition: A | Discharge status: Home / Self Care | Source: Ambulatory Visit | Attending: Cardiology | Admitting: Cardiology

## 2019-09-14 ENCOUNTER — Encounter (HOSPITAL_COMMUNITY): Payer: Self-pay | Admitting: Cardiology

## 2019-09-14 VITALS — BP 94/30 | HR 71 | Wt 149.2 lb

## 2019-09-14 DIAGNOSIS — I110 Hypertensive heart disease with heart failure: Secondary | ICD-10-CM | POA: Insufficient documentation

## 2019-09-14 DIAGNOSIS — I255 Ischemic cardiomyopathy: Secondary | ICD-10-CM | POA: Insufficient documentation

## 2019-09-14 DIAGNOSIS — E785 Hyperlipidemia, unspecified: Secondary | ICD-10-CM | POA: Diagnosis not present

## 2019-09-14 DIAGNOSIS — K219 Gastro-esophageal reflux disease without esophagitis: Secondary | ICD-10-CM | POA: Insufficient documentation

## 2019-09-14 DIAGNOSIS — I480 Paroxysmal atrial fibrillation: Secondary | ICD-10-CM | POA: Insufficient documentation

## 2019-09-14 DIAGNOSIS — C787 Secondary malignant neoplasm of liver and intrahepatic bile duct: Secondary | ICD-10-CM | POA: Diagnosis not present

## 2019-09-14 DIAGNOSIS — G629 Polyneuropathy, unspecified: Secondary | ICD-10-CM | POA: Insufficient documentation

## 2019-09-14 DIAGNOSIS — Z8673 Personal history of transient ischemic attack (TIA), and cerebral infarction without residual deficits: Secondary | ICD-10-CM | POA: Diagnosis not present

## 2019-09-14 DIAGNOSIS — Z7982 Long term (current) use of aspirin: Secondary | ICD-10-CM | POA: Insufficient documentation

## 2019-09-14 DIAGNOSIS — I5022 Chronic systolic (congestive) heart failure: Secondary | ICD-10-CM | POA: Diagnosis not present

## 2019-09-14 DIAGNOSIS — I080 Rheumatic disorders of both mitral and aortic valves: Secondary | ICD-10-CM | POA: Diagnosis not present

## 2019-09-14 DIAGNOSIS — G4733 Obstructive sleep apnea (adult) (pediatric): Secondary | ICD-10-CM | POA: Diagnosis not present

## 2019-09-14 DIAGNOSIS — Z8249 Family history of ischemic heart disease and other diseases of the circulatory system: Secondary | ICD-10-CM | POA: Diagnosis not present

## 2019-09-14 DIAGNOSIS — I2721 Secondary pulmonary arterial hypertension: Secondary | ICD-10-CM | POA: Insufficient documentation

## 2019-09-14 DIAGNOSIS — Z951 Presence of aortocoronary bypass graft: Secondary | ICD-10-CM | POA: Insufficient documentation

## 2019-09-14 DIAGNOSIS — C189 Malignant neoplasm of colon, unspecified: Secondary | ICD-10-CM | POA: Insufficient documentation

## 2019-09-14 DIAGNOSIS — E46 Unspecified protein-calorie malnutrition: Secondary | ICD-10-CM | POA: Diagnosis not present

## 2019-09-14 DIAGNOSIS — I872 Venous insufficiency (chronic) (peripheral): Secondary | ICD-10-CM | POA: Diagnosis not present

## 2019-09-14 DIAGNOSIS — D630 Anemia in neoplastic disease: Secondary | ICD-10-CM | POA: Diagnosis not present

## 2019-09-14 DIAGNOSIS — I2510 Atherosclerotic heart disease of native coronary artery without angina pectoris: Secondary | ICD-10-CM | POA: Insufficient documentation

## 2019-09-14 DIAGNOSIS — Z79899 Other long term (current) drug therapy: Secondary | ICD-10-CM | POA: Insufficient documentation

## 2019-09-14 DIAGNOSIS — R001 Bradycardia, unspecified: Secondary | ICD-10-CM | POA: Insufficient documentation

## 2019-09-14 DIAGNOSIS — R627 Adult failure to thrive: Secondary | ICD-10-CM | POA: Diagnosis not present

## 2019-09-14 DIAGNOSIS — C7989 Secondary malignant neoplasm of other specified sites: Secondary | ICD-10-CM | POA: Diagnosis not present

## 2019-09-14 DIAGNOSIS — I252 Old myocardial infarction: Secondary | ICD-10-CM | POA: Insufficient documentation

## 2019-09-14 DIAGNOSIS — C19 Malignant neoplasm of rectosigmoid junction: Secondary | ICD-10-CM | POA: Diagnosis not present

## 2019-09-14 LAB — BASIC METABOLIC PANEL
Anion gap: 12 (ref 5–15)
BUN: 20 mg/dL (ref 8–23)
CO2: 25 mmol/L (ref 22–32)
Calcium: 8.7 mg/dL — ABNORMAL LOW (ref 8.9–10.3)
Chloride: 91 mmol/L — ABNORMAL LOW (ref 98–111)
Creatinine, Ser: 1.11 mg/dL (ref 0.61–1.24)
GFR calc Af Amer: >60 mL/min (ref >60)
GFR calc non Af Amer: >60 mL/min (ref >60)
Glucose, Bld: 102 mg/dL — ABNORMAL HIGH (ref 70–99)
Potassium: 4.1 mmol/L (ref 3.5–5.1)
Sodium: 128 mmol/L — ABNORMAL LOW (ref 135–145)

## 2019-09-14 LAB — CBC
HCT: 35.5 % — ABNORMAL LOW (ref 39.0–52.0)
Hemoglobin: 11.5 g/dL — ABNORMAL LOW (ref 13.0–17.0)
MCH: 30.2 pg (ref 26.0–34.0)
MCHC: 32.4 g/dL (ref 30.0–36.0)
MCV: 93.2 fL (ref 80.0–100.0)
Platelets: 288 10*3/uL (ref 150–400)
RBC: 3.81 MIL/uL — ABNORMAL LOW (ref 4.22–5.81)
RDW: 18.7 % — ABNORMAL HIGH (ref 11.5–15.5)
WBC: 6.1 10*3/uL (ref 4.0–10.5)
nRBC: 0 % (ref 0.0–0.2)

## 2019-09-14 LAB — DIGOXIN LEVEL: Digoxin Level: 1.2 ng/mL (ref 0.8–2.0)

## 2019-09-14 MED ORDER — METOLAZONE 2.5 MG PO TABS
ORAL_TABLET | ORAL | 3 refills | Status: DC
Start: 2019-09-14 — End: 2019-10-02

## 2019-09-14 MED ORDER — TORSEMIDE 20 MG PO TABS: 40.0000 mg | ORAL_TABLET | Freq: Two times a day (BID) | ORAL | 3 refills | Status: AC

## 2019-09-14 MED FILL — metOLazone 2.5 MG TABS: 2.5 | 10 days supply | Qty: 10 | Fill #0

## 2019-09-14 NOTE — Patient Instructions (Addendum)
Labs done today. We will contact you only if your labs are abnormal.   START Metolazone 2.25m take only as directed by the [facility]F Clinic(TAKE 1 TABLET BY MOUTH TODAY, ALSO TAKE AN EXTRA 20MEQ(1 TABLET) OF POTASSIUM TODAY AS WELL)   INCREASE Torsemide 424m2 tablets) by mouth 2 times daily.   No other medication changes were made. Please continue all other medications as prescribed.  PLEASE MAKE SURE YOU ARE WEARING YOUR COMPRESSION STOCKINGS TO HELP WITH FLUID RETENTION/SWELLING.  Your physician recommends that you schedule a follow-up appointment in: 1 week for a lab only appointment([facility] home [facility] can draw). Please keep your pending follow up with Dr. McAundra Dubinuly 6th @ 2:20pm.  If you have any questions or concerns before your next appointment please send [place] message through [facility] call our office at [phone]   TO LEAVE A MESSAGE FOR THE NURSE SELECT OPTION 2, PLEASE LEAVE A MESSAGE INCLUDING: . YOUR NAME . DATE OF BIRTH . CALL BACK NUMBER . REASON FOR CALL**this is important as we prioritize the call backs  YOU WILL RECEIVE A CALL BACK THE SAME DAY AS LONG AS YOU CALL BEFORE 4:00 PM   Do the following things EVERYDAY: 1) Weigh yourself in the morning before breakfast. Write it down and keep it in a log. 2) Take your medicines as prescribed 3) Eat low salt foods--Limit salt (sodium) to 2000 mg per day.  4) Stay as active as you can everyday 5) Limit all fluids for the day to less than 2 liters   At the [facility] and your health needs are our priority. As part of our continuing mission to provide you with exceptional heart care, we have created designated Provider Care Teams. These Care Teams include your primary Cardiologist (physician) and Advanced Practice Providers (APPs- Physician Assistants and Nurse Practitioners) who all work together to provide you with the care you need, when you need it.   You may see any of the following providers on  your designated Care Team at your next follow up: . Marland Kitchenr DaGlori Bickers Dr DaLoralie Champagne AmDarrick GrinderNP . BrLyda JesterPA . LaAudry RilesPharmD   Please be sure to bring in all your medications bottles to every appointment.

## 2019-09-14 NOTE — Progress Notes (Signed)
Medication Samples have been provided to the patient.  Drug name: [name]       Strength: 24/27m        Qty: 2  LOT: [id number] Exp.Date: 8/22  Dosing instructions: take 1 tab Twice daily   The patient has been instructed regarding the correct time, dose, and frequency of taking this medication, including desired effects and most common side effects.   Caleb [name] 10:44 AM 09/14/2019

## 2019-09-14 NOTE — Progress Notes (Signed)
Patient ID: Caleb [name], male   DOB: 24-Jun-1940, 79 y.o.   MRN: [id number]    Advanced Heart Failure Clinic Note   PCP: [name] EP: [name] Cardiology: [name]  79 y.o.with complex past history presents for heart failure followup.  Patient had anterior MI in 2004 and developed ischemic cardiomyopathy as well as mitral regurgitation. He also developed atrial fibrillation.  In 10/14, he had MV repair, Maze, CABG with LIMA-LAD, and LA appendage closure at [facility] in [place].  Subsequently, atrial fibrillation returned and he had an atrial fibrillation ablation in [facility] in 3/15.  He wore a Zio patch in 12/15 and had a low atrial fibrillation burden of 7%.  He has had a long-standing ischemic cardiomyopathy.  In 2015, EF was 20-25%.  Echo in 2016 also showed EF 25-30% but estimated PA pressure suggested severe pulmonary hypertension.     At initial appointment, he reported increased exertional dyspnea over a number of weeks.  RHC was done, showing mildly elevated PCWP with moderate pulmonary HTN and low cardiac index (1.93 thermo, 2.13 Fick).  V/Q scan showed no PE.  I started him on digoxin and have titrated his Lasix to 80 mg daily.  He is now on bisoprolol and able to tolerate it.  At last appointment, I had him try replacing valsartan with Entresto 24/26 bid.  He was unable to tolerate it (made him dizzy, though BP was not low).  Therefore, I had him restart valsartan.  CPX in 4/16 showed mildly decreased functional capacity.  He has tolerated Adcirca 20 mg daily.  He did not tolerate an attempt to uptitrate bisoprolol.  He tried CPAP but was unable to tolerate it.  He was unable to tolerate eplerenone 50 mg daily.  Last echo in 9/17 showed EF 20-25%, stable MV repair, normal RV, and PA systolic pressure 86 mmHg.   He had a Lexiscan Cardiolite in 9/17 that showed infarction, no ischemia.  [place] in 10/17 showed severe mixed pulmonary venous HTN/pulmonary arterial HTN with PVR 4.3 WU  and PCWP mildly elevated.  Adcirca was increased to 40 mg daily.   At a prior appointment, he was having more palpitations.  Event monitor in 11/17 showed atrial fibrillation episodes, these were symptomatic.  Since then, the atrial fibrillation seems to have decreased.  He saw [name] to discuss Tikosyn initiation.  No decision was reached at that time, and since palpitations have decreased again, he wants to hold off on Tikosyn for now.   He has OSA.  He cannot tolerate CPAP but is using his oral appliance.    HR was lower in 5/18.  He came into the office with HR around 40.  ECG showed an ectopic atrial rhythm with very small P waves. I stopped bisoprolol and later digoxin.  HR has gone up since.  He wore an event monitor in 6/18 showing rare 3-5 second pauses at night only, rare atrial fibrillation, and rare junctional bradycardia.  He wore an event monitor again in 4/19, this showed rare afib (1% total) with nocturnal bradycardia and 1 nocturnal 3 second pause.  No concerning findings.   He stopped Adcirca as his insurance was no longer covering it.    He saw [name], and it was decided that he would be unlikely to improve much with CRT with his current IVCD (not true LBBB).   Echo (7/19) is comparable to the past with mildly dilated LV, EF 25% with wall motion abnormalities, mild  RV dilation/mild decreased function, mild to moderate AI, stable repaired mitral valve, PASP 76 mmHg.     Given worsening symptoms, he had RHC in 8/19.  This showed evidence for volume overload with severe pulmonary arterial hypertension, PVR 5.8 WU and preserved cardiac output. Torsemide was increased.    He was started on Opsumit.  He says that it helped his breathing but he was unable to continue it due to considerable worsening of his peripheral edema.  He has stopped Opsumit.    After developing increased atrial fibrillation burden, I put him back on bisoprolol 2.5 mg daily.  He developed bradycardia and  presyncope, so this was stopped.  Lightheadedness resolved.  Zio patch in 6/20 off bisoprolol showed average HR 68, short runs of SVT (possible atrial fibrillation) and only nocturnal bradycardia.    Echo was done in 7/20 and reviewed, EF 15%, moderate-severe LV dilation, mild RV dilation with decreased systolic function, mild-moderate MR s/p MV repair, mild-moderate AI, PASP 40 mmHg.   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  He has a mass at the hepatic flexure and mets to the liver.  Liver biopsy showed colonic adenocarcinoma.  He declined palliative chemotherapy and is now under hospice care.   He continues to have severe fatigue and weakness that has been progressive since his metastatic cancer diagnosis.  He is short of breath walking across the house.  No lightheadedness, no chest pain. No palpitations. He is concerned about increased lower extremity edema and scrotal edema.   REDS clip 25%.    ECG (personally reviewed): Ectopic atrial rhythm, RBBB, LAFB  6 minute walk (3/16): 381 m  6 minute walk (5/16): 414.5 m 6 minute walk (10/16): 562 m 6 minute walk (1/17): 469 m 6 minute walk (8/17): 488 m 6 minute walk (6/18): 549 m 6 minute walk (11/18): 366 m 6 minute walk (9/19): 518 m 6 minute walk (9/20): 487 m 6 minute walk (12/20): 426 m  Labs (2/15): LDL 144 Labs (8/15): K 4.6, creatinine 0.9 Labs (12/15): HCT 42.3   Labs (2/16): K 4 => 4.2, creatinine 1.05 => 0.92, BNP 268 Labs (3/16): BNP 495 => 320, digoxin 0.4, RF 14.7 (very mild increase), TSH normal, HIV negative, anti-SCL70 negative, creatinine 0.91, K 4.4 Labs (7/16): K 5, creatinine 1.05, BNP 455, vitamin D normal, digoxin 0.4, B12 normal Labs (8/16): digoxin 0.8, BNP 305, K 4.2, creatinine 0.99 Labs (9/16): HCT 43.3, TSH normal, BNP 492 => 278, K 4.3, creatinine 0.97 => 1.04, digoxin 0.6, TSH normal, LDL 154, LDL-P 1654.  Labs (10/16): K 4.7, creatinine 1.1 Labs (1/17): pro-BNP 2065, digoxin 0.3, K  4.3, creatinine 1.10 => 1.28 Labs (3/17): K 4.1, creatinine 1.09, HCT 38.3 Labs (4/17): K 4.4, creatinine 0.99, digoxin 0.8 Labs (5/17): K 4.3, creatinine 1.08, BNP 317, hgb 13.1 Labs (8/17): K 4.6, creatinine 0.99, BNP 392, digoxin 0.5 Labs (9/17): K 4.4, creatinine 1.05 Labs (10/17): digoxin 0.5, K 4.1, creatinine 1.12, HCT 44.9, digoxin 0.5 Labs (11/17): K 4 => 3.8, creatinine 1.3 => 1.16, digoxin 0.5, BNP 296 Labs (12/17): K 4.1, creatinine 1.15, BNP 294, digoxin 0.7 Labs (2/18): LDL 135, Lp(a) 124, LDL-P 979  Labs (5/18): K 4.3, creatinine 1.07 => 0.95, BNP 224, hgb 14.4, digoxin 0.6 Labs (6/18): K 4.4, creatinine 1.08, hgb 14 Labs (8/18): K 4.1, creatinine 1.05 Labs (12/18): K 3.7, creatinine 0.94 Labs (2/19): K 4.1, creatinine 0.99, BNP 255 Labs (5/19): K 4, creatinine 0.98, LDL 140 Labs (7/19): LDL  136 Labs (9/19): K 4.1, creatinine 0.91 Labs (12/19): K 3.6, creatinine 1.18, BNP 479, hgb 12.9 Labs (3/20): K 3.4, creatinine 1.14 Labs (5/20): K 4.1, creatinine 0.96 Labs (7/20): K 4, creatinine 1.05, BNP 407  Labs (10/20): BNP 489, digoxin < 0.2 Labs (11/20): K 3.5, creatinine 1.0 Labs (12/20): K 3.1, creatinine 1.26 => 1.07, digoxin < 0.2 => 0.5 Labs (4/21): K 3.9, creatinine 1.18, hgb 12.9 Labs (5/21): digoxin 0.6, K 3.9, creatinine 1.0 Labs (6/21): K 4.5, creatinine 1.44, hgb 11.5, albumen 3  PMH: 1. CAD: Anterior MI in 2004.  Cardiac surgery in 10/14 included LIMA-LAD.  - Lexiscan Cardiolite (9/17): EF 35%, infarct present with no ischemia.  2. Chronic mitral regurgitation: 10/14 surgery at [facility] with MV repair, Maze, LIMA-LAD, and LA appendage closure.  3. Atrial fibrillation: Paroxysmal.  He was initially on Tikosyn but had breakthrough atrial fibrillation.  H/o Maze in 2014.  Had recurrent atrial fibrillation with ablation in 3/15 by Dr Ola Spurr in [facility].  Not anticoagulated after 2 severe prostate bleeding episodes.  LA appendage was oversewn with MV  surgery.  - Zio patch in 12/15 with low atrial fibrillation burden (7%).  - Holter (9/16) with PACs, PVCs, short atrial fibrillation runs (nothing sustained). - Event monitor (11/17) with runs of atrial fibrillation and flutter.   - Event monitor (6/18) with rare atrial fibrillation - Event monitor 4/19 showing rare afib (1% total) with nocturnal bradycardia and 1 nocturnal 3 second pause.  No concerning findings.  - Zio patch (6/20): Primarily NSR, average HR 68, short SVT runs (possible atrial fibrillation), occasional nocturnal bradycardia (nothing worrisome).  4. Chronotropic incompetence.  5. HTN 6. Hyperlipidemia: Refuses statin.  7. Peripheral neuropathy 8. GERD 9. H/o BPPV 10. H/o TIA 11. Ischemic cardiomyopathy: cardiac MRI 7/14 with EF 35%, moderate MR, normal RV size and systolic function, extensive anterior and anteroseptal LGE suggestive of non-viable myocardium (this was prior to LIMA-LAD).  Echo 1/15 with EF 20-25%, moderate AI, PA systolic pressure 39 mmHg. Echo (1/16) with EF 20-25%, diffuse hypokinesis with regionality, moderate LV dilation, moderate AI, s/p MV repair with mild MR and normal gradients, RV dilated with mildly decreased systolic function, PA systolic pressure 71 mmHg.   - RHC (2/16) with mean RA 9, PA 61/25 mean 40, mean PCWP 23, CI 2.13/PVR 4.3 (Fick), CI 1.93/PVR 4.7 (thermo).   - CPX (4/16) with peak VO2 17.9, VE/VCO2 34.7 => mildly decreased functional capacity.  - Spironolactone apparently caused cognitive deficits - Intolerant of Coreg due to development of severe alopecia - Unable to uptitrate bisoprolol due to intolerance.  - Lightheaded with Entresto.  - Unable to tolerate increase in valsartan to 80 mg bid.  - Echo (1/17) with EF 30-35%, moderate LV dilation, mild AI, s/p MV repair with mild MR, moderately dilated RV with mildly decreased systolic function, PA systolic pressure 69 mmHg.  - CPX (4/17): peak VO2 18, VE/VCO2 slope 33, RER 1.28 => mild to  moderate functional impairment, mildly improved.  - Echo (9/17): EF 20-25% with regional WMAs, normal RV size and systolic function, PASP 86 mmHg, stable repaired mitral valve with mild MR, moderate AI.  - RHC (10/17): mean RA 9, PA 70/23 mean 43, PCWP mean 20, CI 2.96 Fick/2.84 Thermo, PVR 4.3 WU.  - Echo (9/18): severe LV dilation, EF 20-25% with WMAs, s/p MV repair with mild MR, moderate AI, moderate TR, PASP 68 mmHg, normal RV size and systolic function.  - Echo (7/19): mildly dilated LV, EF  25% with wall motion abnormalities, mild RV dilation/mild decreased function, mild to moderate AI, stable repaired mitral valve (no MS, mild MR), PASP 76 mmHg.   - CPX (8/19): peak VO2 18.9, VE/VCO2 slope 40, RER 1.09 => moderate HF limitation.  - RHC (8/19): mean RA 14, PA 75/28 mean 47, mean PCWP 24, CI 2.44/PVR 5 Fick, CI 2.13/PVR 5.8 thermo.  - Echo (7/20): EF 15%, moderate-severe LV dilation, mild RV dilation with decreased systolic function, mild-moderate MR s/p MV repair, mild-moderate AI, PASP 40 mmHg.  - CPX (1/21): Peak VO2 20, VE/VCO2 slope 46, RER 1.05. Moderate HF limitation, similar to prior.  12. Carotid stenosis: Carotid dopplers (1/16) with 40-59% bilateral ICA stenosis. Carotid dopplers (4/17) with 40-59% BICA stenosis.  - Carotid dopplers (2/18) with < 50% BICA stenosis.  - Carotid dopplers (10/20): Mild stenosis.  13. Aortic insufficiency: Mild-moderate by last echo.  14. Pulmonary HTN: Mixed PAH and pulmonary venous hypertension.  PFTs (9/14) were normal.  V/Q scan (2/16) with no evidence of acute or chronic PE.   - Unable to tolerate Opsumit due to extensive peripheral edema.  15. OSA: Moderate on 5/16 sleep study. Unable to tolerate CPAP.  Repeat sleep study at [facility] in 2/18 was also suggestive of OSA.  16. Venous insufficiency 17. Ventral hernia 18. Bradycardia: Holter (5/18) with overnight pauses up to 4.7 sec (likely due to OSA), occasional short atrial tachycardia runs, no  atrial fibrillation, avg HR 70s, 3% PVCs.  - Event monitor (6/18): Rare 3-4 second pauses at night, rare atrial fibrillation, rare runs of junctional bradycardia.  30. Colon cancer: Diagnosed 2021, metastatic to liver (stage IV).   SH: Married, lives in [place], Engineer, water, nonsmoker  FH: CAD  ROS: All systems reviewed and negative except as per HPI.   Current Outpatient Medications  Medication Sig Dispense Refill   acetaminophen (TYLENOL) 160 MG/5ML solution Take 15.6 mLs (500 mg total) by mouth every 6 (six) hours as needed for moderate pain or fever. 120 mL 0   digoxin (LANOXIN) 0.125 MG tablet Take 1 tablet (0.125 mg total) by mouth daily. 90 tablet 3   ENTRESTO 24-26 MG TAKE 1 TABLET BY MOUTH 2 (TWO) TIMES DAILY. 60 tablet 5   LORazepam (ATIVAN) 2 MG/ML concentrated solution Take 0.3 mLs (0.6 mg total) by mouth every 4 (four) hours as needed for anxiety. 30 mL 0   Morphine Sulfate (MORPHINE CONCENTRATE) 10 mg / 0.5 ml concentrated solution Take 0.5 mLs (10 mg total) by mouth every 12 (twelve) hours as needed for severe pain or shortness of breath. 30 mL 0   ondansetron (ZOFRAN-ODT) 8 MG disintegrating tablet Take 1 tablet (8 mg total) by mouth every 8 (eight) hours as needed for nausea or vomiting. 20 tablet 0   potassium chloride SA (KLOR-CON) 20 MEQ tablet Take 1 tablet by mouth daily.     prochlorperazine (COMPAZINE) 10 MG tablet Take 1 tablet (10 mg total) by mouth every 6 (six) hours as needed for nausea or vomiting. 30 tablet 0   sennosides (SENOKOT) 8.8 MG/5ML syrup Take 10 mLs by mouth at bedtime. 240 mL 0   torsemide (DEMADEX) 20 MG tablet Take 2 tablets (40 mg total) by mouth 2 (two) times daily. 360 tablet 3   traMADol (ULTRAM) 50 MG tablet 1 to 2 tablets PO QID prn, Pain 90 tablet 0   metolazone (ZAROXOLYN) 2.5 MG tablet Take only as directed by the CHF clinic 10 tablet 3   No current facility-administered medications for  this encounter.   BP (!) 94/30  (Patient Position: Lying left side)    Pulse 71    Wt 67.7 kg (149 lb 3.2 oz)    SpO2 99%    BMI 21.41 kg/m    Wt Readings from Last 3 Encounters:  09/14/19 67.7 kg (149 lb 3.2 oz)  09/07/19 66.5 kg (146 lb 9.6 oz)  08/24/19 64.8 kg (142 lb 12.8 oz)    General: NAD Neck: No JVD, no thyromegaly or thyroid nodule.  Lungs: Clear to auscultation bilaterally with normal respiratory effort. CV: Nondisplaced PMI.  Heart regular S1/S2, no S3/S4, 1/6 SEM RUSB.  2+ edema to thighs.  No carotid bruit.  Normal pedal pulses.  Abdomen: Soft, nontender, no hepatosplenomegaly, no distention.  Skin: Intact without lesions or rashes.  Neurologic: Alert and oriented x 3.  Psych: Normal affect. Extremities: No clubbing or cyanosis.  HEENT: Normal.   Assessment/Plan: 1. CAD: S/p LIMA-LAD.  He decided not to take statins after reviewing the data (I did recommend taking a statin but we have agreed to disagree on this, he is taking red yeast rice extract).  Lexiscan Cardiolite was done in 9/17 due to atypical chest pain.  This showed prior infarction with no ischemia.  No recurrence of chest pain since that time despite ongoing exercise.   - Continue ASA 81 daily.  2. S/p mitral valve repair: The MV repair looked stable on 7/20 echo with no MS, mild-moderate MR.  3. Aortic insufficiency: Echo in 9/20 with mild-moderate AI, stable.  4. Chronic systolic CHF: Ischemic cardiomyopathy, EF 15% with mildly decreased RV systolic function on 4/91 echo.  Most recent CPX in 1/21 with moderate HF limitation, no change from prior.  He was volume overloaded on 8/19 RHC with severe pulmonary hypertension but preserved cardiac output.  He is very weak and fatigued, but I do not think that he has significant volume overload from CHF => REDS clip is low (25%) with no JVD.  I think that the peripheral edema is from venous insufficiency/hypoproteinemia. Weakness/fatigue may be due to metastatic colon cancer.  The swelling, however, is  uncomfortable, and he is having a hard time urinating with scrotal edema.  Weight is up 7 lbs.  - I will let him increase torsemide to 40 mg bid and will have him take metolazone 2.5 mg x 1 with an extra KCl 20.  BMET today and again in 1 week.  - Increase protein in diet, would use dietary supplement.  - Wear compression stockings.  - Off bisoprolol with bradycardia.  - Continue digoxin 0.125 daily and check level today.   - Continue Entresto 24/26 bid.  - He is off eplerenone.       - He would be unlikely to benefit much from CRT, have reviewed with [name] (had IVCD not LBBB in the past, now today has RBBB).   5. Atrial fibrillation and atypical atrial flutter: s/p Maze in 10/14, then ablation in 3/15.  Prior to Maze, he was on Tikosyn but had breakthrough.  Currently, he is not anticoagulated due to history of prostate bleeding and his choice.  He did have his LA appendage oversewn at time of MV surgery.   1% atrial fibrillation on 4/19 event monitor.  Zio patch in 6/20 with short SVT runs (possible atrial fibrillation).  Ectopic atrial rhythm today.  - He is off bisoprolol with bradycardia.    6. Pulmonary hypertension: Severe by echo in 9/17 and on RHC in 10/17.  RV actually looked ok on 9/17 echo.  Mixed pulmonary venous and pulmonary arterial HTN on RHC.  It is possible that the [facility] component is due to pulmonary vascular remodeling in the setting of chronic mitral regurgitation prior to MV repair. Negative V/Q scan, no evidence for CTEPH.  PFTs normal in 9/14.  He is seeing Dr Lake Bells.  Sleep study showed moderate OSA but he has been unable to tolerate CPAP and is now using an oral device. PASP 76 mmHg by echo in 7/19.  He tried Engineer, site but stopped it so that he could take a nitric oxide supplement.  I repeated RHC in 8/19.  This showed severe mixed pulmonary venous/pulmonary arterial HTN with PVR 5.8 WU and preserved cardiac output. He was started on Opsumit but did not tolerate due to  extensive peripheral edema.  - Breathing improved with Opsumit, but he was unable to tolerate due to severe peripheral edema.     - He stopped tadalafil again as he did not feel like it helped (would only take 10 mg daily).  7. OSA: Moderate.  Has not tolerated CPAP. Probably plays a role in recurrent atrial fibrillation and pulmonary hypertension as well as nocturnal sinus pauses.  He has been using his oral device. 8. Carotid stenosis: Mild on 10/20 dopplers.   9. Bradycardia: Nocturnal bradycardia in the past likely related to OSA.  Developed more ongoing bradycardia and bisoprolol and digoxin stopped.  Restarted later on bisoprolol with afib/mild RVR but became bradycardic with pre-syncope and bisoprolol stopped. I think that he has sick sinus syndrome.  He could eventually need a PPM, but currently, his HR is adequate off bisoprolol, and he has tolerated restarting digoxin.   - He will stay off beta blockers.  - Follow digoxin level.  10. Metastatic colon cancer: He is seeing Dr. Burr Medico.  Not good candidate for surgery or intensive chemotherapy. He decided against palliative chemotherapy and is now being followed by hospice.   Followup in 1 month.   Loralie Champagne 09/14/2019

## 2019-09-14 NOTE — Progress Notes (Signed)
ReDS Vest / Clip - 09/14/19 0800      ReDS Vest / Clip   Station Marker C    Ruler Value 27    ReDS Value Range Low volume    ReDS Actual Value 25    Anatomical Comments sitting

## 2019-09-16 DIAGNOSIS — C787 Secondary malignant neoplasm of liver and intrahepatic bile duct: Secondary | ICD-10-CM | POA: Diagnosis not present

## 2019-09-16 DIAGNOSIS — E46 Unspecified protein-calorie malnutrition: Secondary | ICD-10-CM | POA: Diagnosis not present

## 2019-09-16 DIAGNOSIS — C19 Malignant neoplasm of rectosigmoid junction: Secondary | ICD-10-CM | POA: Diagnosis not present

## 2019-09-16 DIAGNOSIS — R627 Adult failure to thrive: Secondary | ICD-10-CM | POA: Diagnosis not present

## 2019-09-16 DIAGNOSIS — D630 Anemia in neoplastic disease: Secondary | ICD-10-CM | POA: Diagnosis not present

## 2019-09-16 DIAGNOSIS — C7989 Secondary malignant neoplasm of other specified sites: Secondary | ICD-10-CM | POA: Diagnosis not present

## 2019-09-18 DIAGNOSIS — C19 Malignant neoplasm of rectosigmoid junction: Secondary | ICD-10-CM | POA: Diagnosis not present

## 2019-09-18 DIAGNOSIS — C7989 Secondary malignant neoplasm of other specified sites: Secondary | ICD-10-CM | POA: Diagnosis not present

## 2019-09-18 DIAGNOSIS — E46 Unspecified protein-calorie malnutrition: Secondary | ICD-10-CM | POA: Diagnosis not present

## 2019-09-18 DIAGNOSIS — D630 Anemia in neoplastic disease: Secondary | ICD-10-CM | POA: Diagnosis not present

## 2019-09-18 DIAGNOSIS — R627 Adult failure to thrive: Secondary | ICD-10-CM | POA: Diagnosis not present

## 2019-09-18 DIAGNOSIS — C787 Secondary malignant neoplasm of liver and intrahepatic bile duct: Secondary | ICD-10-CM | POA: Diagnosis not present

## 2019-09-20 ENCOUNTER — Telehealth: Payer: Self-pay | Admitting: Internal Medicine

## 2019-09-20 DIAGNOSIS — C787 Secondary malignant neoplasm of liver and intrahepatic bile duct: Secondary | ICD-10-CM | POA: Diagnosis not present

## 2019-09-20 DIAGNOSIS — C7989 Secondary malignant neoplasm of other specified sites: Secondary | ICD-10-CM | POA: Diagnosis not present

## 2019-09-20 DIAGNOSIS — C19 Malignant neoplasm of rectosigmoid junction: Secondary | ICD-10-CM | POA: Diagnosis not present

## 2019-09-20 DIAGNOSIS — D630 Anemia in neoplastic disease: Secondary | ICD-10-CM | POA: Diagnosis not present

## 2019-09-20 DIAGNOSIS — E46 Unspecified protein-calorie malnutrition: Secondary | ICD-10-CM | POA: Diagnosis not present

## 2019-09-20 DIAGNOSIS — R627 Adult failure to thrive: Secondary | ICD-10-CM | POA: Diagnosis not present

## 2019-09-20 NOTE — Telephone Encounter (Signed)
Form received and given to PCP.

## 2019-09-20 NOTE — Telephone Encounter (Signed)
Forms completed and faxed.

## 2019-09-20 NOTE — Telephone Encounter (Signed)
    Collie Siad from [facility] requesting order for admission to their facility Please call [phone]  She is also sending over a fax for completion

## 2019-09-20 NOTE — Telephone Encounter (Signed)
Filled out, please call to find out remaining info for FL-2 and print medication list to accompany.

## 2019-09-21 ENCOUNTER — Other Ambulatory Visit (HOSPITAL_COMMUNITY): Payer: Medicare Other

## 2019-09-21 DIAGNOSIS — R627 Adult failure to thrive: Secondary | ICD-10-CM | POA: Diagnosis not present

## 2019-09-21 DIAGNOSIS — I4891 Unspecified atrial fibrillation: Secondary | ICD-10-CM | POA: Diagnosis not present

## 2019-09-21 DIAGNOSIS — Z6820 Body mass index (BMI) 20.0-20.9, adult: Secondary | ICD-10-CM | POA: Diagnosis not present

## 2019-09-21 DIAGNOSIS — C19 Malignant neoplasm of rectosigmoid junction: Secondary | ICD-10-CM | POA: Diagnosis not present

## 2019-09-21 DIAGNOSIS — N183 Chronic kidney disease, stage 3 unspecified: Secondary | ICD-10-CM | POA: Diagnosis not present

## 2019-09-21 DIAGNOSIS — E46 Unspecified protein-calorie malnutrition: Secondary | ICD-10-CM | POA: Diagnosis not present

## 2019-09-21 DIAGNOSIS — I2510 Atherosclerotic heart disease of native coronary artery without angina pectoris: Secondary | ICD-10-CM | POA: Diagnosis not present

## 2019-09-21 DIAGNOSIS — I130 Hypertensive heart and chronic kidney disease with heart failure and stage 1 through stage 4 chronic kidney disease, or unspecified chronic kidney disease: Secondary | ICD-10-CM | POA: Diagnosis not present

## 2019-09-21 DIAGNOSIS — I509 Heart failure, unspecified: Secondary | ICD-10-CM | POA: Diagnosis not present

## 2019-09-21 DIAGNOSIS — C787 Secondary malignant neoplasm of liver and intrahepatic bile duct: Secondary | ICD-10-CM | POA: Diagnosis not present

## 2019-09-21 DIAGNOSIS — C7989 Secondary malignant neoplasm of other specified sites: Secondary | ICD-10-CM | POA: Diagnosis not present

## 2019-09-21 DIAGNOSIS — Z8673 Personal history of transient ischemic attack (TIA), and cerebral infarction without residual deficits: Secondary | ICD-10-CM | POA: Diagnosis not present

## 2019-09-21 DIAGNOSIS — D630 Anemia in neoplastic disease: Secondary | ICD-10-CM | POA: Diagnosis not present

## 2019-09-21 DIAGNOSIS — I272 Pulmonary hypertension, unspecified: Secondary | ICD-10-CM | POA: Diagnosis not present

## 2019-09-22 DIAGNOSIS — I5022 Chronic systolic (congestive) heart failure: Secondary | ICD-10-CM | POA: Diagnosis not present

## 2019-09-22 DIAGNOSIS — C7989 Secondary malignant neoplasm of other specified sites: Secondary | ICD-10-CM | POA: Diagnosis not present

## 2019-09-22 DIAGNOSIS — C787 Secondary malignant neoplasm of liver and intrahepatic bile duct: Secondary | ICD-10-CM | POA: Diagnosis not present

## 2019-09-22 DIAGNOSIS — C19 Malignant neoplasm of rectosigmoid junction: Secondary | ICD-10-CM | POA: Diagnosis not present

## 2019-09-22 DIAGNOSIS — R627 Adult failure to thrive: Secondary | ICD-10-CM | POA: Diagnosis not present

## 2019-09-22 DIAGNOSIS — D630 Anemia in neoplastic disease: Secondary | ICD-10-CM | POA: Diagnosis not present

## 2019-09-22 DIAGNOSIS — E46 Unspecified protein-calorie malnutrition: Secondary | ICD-10-CM | POA: Diagnosis not present

## 2019-09-22 DIAGNOSIS — D649 Anemia, unspecified: Secondary | ICD-10-CM | POA: Diagnosis not present

## 2019-09-22 LAB — BASIC METABOLIC PANEL
BUN: 25 — AB (ref 4–21)
CO2: 28 — AB (ref 13–22)
Chloride: 81 — AB (ref 99–108)
Creatinine: 0.9 (ref 0.6–1.3)
Glucose: 73
Potassium: 4.3 (ref 3.4–5.3)
Sodium: 126 — AB (ref 137–147)

## 2019-09-22 LAB — COMPREHENSIVE METABOLIC PANEL: Calcium: 9.2 (ref 8.7–10.7)

## 2019-09-25 DIAGNOSIS — C19 Malignant neoplasm of rectosigmoid junction: Secondary | ICD-10-CM | POA: Diagnosis not present

## 2019-09-25 DIAGNOSIS — C787 Secondary malignant neoplasm of liver and intrahepatic bile duct: Secondary | ICD-10-CM | POA: Diagnosis not present

## 2019-09-25 DIAGNOSIS — D630 Anemia in neoplastic disease: Secondary | ICD-10-CM | POA: Diagnosis not present

## 2019-09-25 DIAGNOSIS — C7989 Secondary malignant neoplasm of other specified sites: Secondary | ICD-10-CM | POA: Diagnosis not present

## 2019-09-25 DIAGNOSIS — R627 Adult failure to thrive: Secondary | ICD-10-CM | POA: Diagnosis not present

## 2019-09-25 DIAGNOSIS — E46 Unspecified protein-calorie malnutrition: Secondary | ICD-10-CM | POA: Diagnosis not present

## 2019-09-26 ENCOUNTER — Non-Acute Institutional Stay (SKILLED_NURSING_FACILITY): Payer: Medicare Other | Admitting: Internal Medicine

## 2019-09-26 ENCOUNTER — Encounter (HOSPITAL_COMMUNITY): Payer: Medicare Other | Admitting: Cardiology

## 2019-09-26 DIAGNOSIS — R1900 Intra-abdominal and pelvic swelling, mass and lump, unspecified site: Secondary | ICD-10-CM

## 2019-09-26 DIAGNOSIS — C787 Secondary malignant neoplasm of liver and intrahepatic bile duct: Secondary | ICD-10-CM | POA: Diagnosis not present

## 2019-09-26 DIAGNOSIS — E46 Unspecified protein-calorie malnutrition: Secondary | ICD-10-CM | POA: Diagnosis not present

## 2019-09-26 DIAGNOSIS — D630 Anemia in neoplastic disease: Secondary | ICD-10-CM | POA: Diagnosis not present

## 2019-09-26 DIAGNOSIS — C7989 Secondary malignant neoplasm of other specified sites: Secondary | ICD-10-CM | POA: Diagnosis not present

## 2019-09-26 DIAGNOSIS — I5022 Chronic systolic (congestive) heart failure: Secondary | ICD-10-CM

## 2019-09-26 DIAGNOSIS — C189 Malignant neoplasm of colon, unspecified: Secondary | ICD-10-CM | POA: Diagnosis not present

## 2019-09-26 DIAGNOSIS — I480 Paroxysmal atrial fibrillation: Secondary | ICD-10-CM

## 2019-09-26 DIAGNOSIS — I272 Pulmonary hypertension, unspecified: Secondary | ICD-10-CM

## 2019-09-26 DIAGNOSIS — R627 Adult failure to thrive: Secondary | ICD-10-CM | POA: Diagnosis not present

## 2019-09-26 DIAGNOSIS — C19 Malignant neoplasm of rectosigmoid junction: Secondary | ICD-10-CM | POA: Diagnosis not present

## 2019-09-26 DIAGNOSIS — K5900 Constipation, unspecified: Secondary | ICD-10-CM

## 2019-09-26 NOTE — Progress Notes (Signed)
Provider:  Rexene Edison. Mariea Clonts, D.O., C.M.D. Location:  [facility] Room Number: 155 Place of Service:  SNF (31)  PCP: Hoyt Koch, MD Patient Care Team: Hoyt Koch, MD as PCP - General (Internal Medicine) Elsie Stain, MD (Pulmonary Disease) Deboraha Sprang, MD as Consulting Physician (Cardiology) Jonnie Finner, RN as Oncology Nurse Navigator  Extended Emergency Contact Information Primary Emergency Contact: Boule,Carol Address: [address]          [place], [place] [address] [name] of [place] Phone: [phone] Mobile Phone: [phone] Relation: Spouse Secondary Emergency Contact: Justice [place] Phone: [phone] Mobile Phone: [phone] Relation: Son Interpreter needed? No  Code Status: DNR, hospice Goals of Care: Advanced Directive information Advanced Directives 09/28/2019  Does Patient Have a Medical Advance Directive? Yes  Type of Paramedic of [place];Living will;Out of facility DNR (pink MOST or yellow form)  Does patient want to make changes to medical advance directive? No - Guardian declined  Copy of [place] in Chart? Yes - validated most recent copy scanned in chart (See row information)  Would patient like information on creating a medical advance directive? -  Pre-existing out of facility DNR order (yellow form or pink MOST form) Pink MOST/Yellow Form most recent copy in chart - Physician notified to receive inpatient order      Chief Complaint  Patient presents with  . New Admit To SNF    New Admit to Rehab     HPI: Patient is a 78 y.o. male seen today for admission to [place] [place] [facility] 09/20/19 on hospice care due to metastatic colon cancer to his liver.  He had been hospitalized 6/15-6/17/21 with failure to thrive.  He has a h/o recently diagnosed metastatic colon cancer in 3/21, extensive heart disease  (CAD, afib, htn, CHF with EF20%, pulmonary htn followed at CHF clinic.  He was admitted by Dr. Burr Medico at that time for symptom management.  He had been offered palliative chemo at the time of metastastic dx but declined.  He has been struggling with dyspnea and was following the cardiology.  He more recently developed worsening fatigue, intermittent RUQ abdominal pain and overall body aches.  This had gotten worse the week before his hospital admission.  When Dr. Burr Medico saw him prior to his hospital admission, he was then ready for hospice care.  He agreed to DNR/DNI code status.  He was given IVFs gently, zofran, dexamethasone, and comfort care orders.  Palliative care team assisted and called hospice.  Apparently, he'd intended to return home with hospice and was open to residential hospice when care needs became too much; however, he has come to [facility] as an alternative.  He struggled with constipation during his hospital admission and was started on senokot and miralax.  He opted to continue some of his cardiac meds and they opted to keep entresto, dig and a decreased dose of torsemide.  He got a 30 day supply of the entresto before switching to hospice but they will not cover it for refills though cardiology will garner samples from their office if needed.  He was feeling better at discharge except persistent fatigue and weakness per heme/onc note.      He was seen 6/24 at cardiology, [name], and had 7# weight gain with considerable abdominal and scrotal swelling that was interfering with his ability to urinate.  Torsemide was increased to 73m po bid and he  was given metolazone 2.63m once and an extra 220m potassium.  Bmp checked then and to be repeated at one week.  He is to wear compression stockings.  He is to f/u with CHF clinic again in a month.  Apparently, it was becoming challenging to manage at home and [facility]  Since coming here 6/30, he was having increased pain  but did not tolerate morphine.  He was placed on tramadol every 4 hours scheduled.  He does refuse it at times due to dysphagia and nausea.  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  He's trying to stay more alert for her.  He always thought his heart would kill him not cancer.    Past Medical History:  Diagnosis Date  . Allergic rhinitis   . Atrial fibrillation ([facility]  . Atypical pneumonia   . CAD (coronary artery disease)   . Cardiomyopathy, ischemic   . Chronic anticoagulation   . Cough   . Dizziness   . GERD (gastroesophageal reflux disease)   . Heart failure, systolic, acute on chronic ([facility]  . Hyperlipidemia   . Hypertension   . OSA (obstructive sleep apnea)    Home sleep test 07/05/2009 AHI 8.2  . Pleural effusion   . Positional vertigo   . TIA (transient ischemic attack)    Past Surgical History:  Procedure Laterality Date  . AORTIC VALVE REPAIR  01/09/13  . CARDIAC CATHETERIZATION N/A 01/10/2016   Procedure: Right Heart Cath;  Surgeon: DaLarey DresserMD;  [facility];  Service: Cardiovascular;  Laterality: N/A;  . CORONARY ARTERY BYPASS GRAFT  01/09/13   LAD LIMA, left atrial appendage  . CORONARY STENT PLACEMENT  2004   LAD  . MITRAL VALVE ANNULOPLASTY  01/09/13  . RIGHT HEART CATH N/A 11/16/2017   Procedure: RIGHT HEART CATH;  Surgeon: McLarey DresserMD;  [facility];  Service: Cardiovascular;  Laterality: N/A;  . RIGHT HEART CATHETERIZATION N/A 05/11/2014   Procedure: RIGHT HEART CATH;  Surgeon: DaLarey DresserMD;  [facility];  Service: Cardiovascular;  Laterality: N/A;  . SHOULDER SURGERY    . TEE WITHOUT CARDIOVERSION N/A 09/21/2012   Procedure: TRANSESOPHAGEAL ECHOCARDIOGRAM (TEE);  Surgeon: DaLarey DresserMD;  [facility];  Service: Cardiovascular;  Laterality: N/A;    Social History   Socioeconomic History  . Marital status: Married    Spouse name: Not on file  . Number of children: Not on file  . Years of education: Not on file  . Highest education level: Not on file  Occupational History  . Occupation: PHYSICIAN    Employer: [employer]  Comment: Psychologist  Tobacco Use  . Smoking status: Never Smoker  . Smokeless tobacco: Never Used  Vaping Use  . Vaping Use: Never used  Substance and Sexual Activity  . Alcohol use: No  . Drug use: No  . Sexual activity: Not on file  Other Topics Concern  . Not on file  Social History Narrative   Married. Wife used to see Dr. JeArnoldo MoraleHe saw Dr. ToSherren Mocha2 sons- 1 is anesthesiologist. 2 grandsons.       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       Hobbies:  Model train, reading, walking in woods, prior fishing, prior golfing   Social Determinants of Health   Financial Resource Strain:   . Difficulty of Paying Living Expenses:   Food Insecurity:   . Worried About Charity fundraiser in the Last Year:   . Arboriculturist in the Last Year:   Transportation Needs:   . Film/video editor (Medical):   Marland Kitchen Lack of Transportation (Non-Medical):   Physical Activity:   . Days of Exercise per Week:   . Minutes of Exercise per Session:   Stress:   . Feeling of Stress :   Social Connections:   . Frequency of Communication with Friends and Family:   . Frequency of Social Gatherings with Friends and Family:   . Attends Religious Services:   . Active Member of Clubs or Organizations:   . Attends Archivist Meetings:   Marland Kitchen Marital Status:     reports that he has never smoked. He has never used smokeless tobacco. He reports that he does not drink alcohol and does not use drugs.  Functional Status Survey:    Family History  Adopted: Yes  Problem  Relation Age of Onset  . Diabetes Other        [place]  . Heart disease Neg Hx   . Hypertension Neg Hx     Health Maintenance  Topic Date Due  . PNA vac Low Risk Adult (2 of 2 - PCV13) 05/21/2009  . INFLUENZA VACCINE  10/22/2019  . TETANUS/TDAP  10/21/2022  . COVID-19 Vaccine  Completed    Allergies  Allergen Reactions  . Carvedilol Other (See Comments)    Dizziness, uneasiness, alopecia  . Codeine     Rapid thoughts, hallucinations  . Opsumit [Macitentan] Swelling    Per patient, caused extreme ankle swelling.   . Dabigatran Rash  . Pradaxa [Dabigatran Etexilate Mesylate] Rash  . Sulfonamide Derivatives Rash  . Xarelto [Rivaroxaban] Rash    Outpatient Encounter Medications as of 09/26/2019  Medication Sig  . digoxin (LANOXIN) 0.125 MG tablet Take 1 tablet (0.125 mg total) by mouth daily. (Patient taking differently: Take 0.0625 mg by mouth daily. )  . ENTRESTO 24-26 MG TAKE 1 TABLET BY MOUTH 2 (TWO) TIMES DAILY.  Marland Kitchen LORazepam (ATIVAN) 2 MG/ML concentrated solution Take 0.3 mLs (0.6 mg total) by mouth every 4 (four) hours as needed for anxiety.  . potassium chloride SA (KLOR-CON) 20 MEQ tablet Take 1 tablet by mouth every other day.   . prochlorperazine (COMPAZINE) 10 MG tablet Take 1 tablet (10 mg total) by mouth every 6 (six) hours as needed for nausea or vomiting.  . torsemide (DEMADEX) 20 MG tablet Take 2 tablets (40 mg total) by mouth 2 (two) times daily. (Patient taking differently: Take 60 mg by mouth 2 (two) times daily. Take 60 mg in the am and 20 mg in the pm)  . traMADol (ULTRAM) 50 MG tablet 1 to 2 tablets PO QID prn, Pain (Patient taking differently: Take 50 mg by mouth 4 (four) times daily as needed. )  . [DISCONTINUED] acetaminophen (TYLENOL) 160 MG/5ML solution Take 15.6 mLs (500 mg total) by mouth every 6 (six) hours as needed for moderate pain or fever.  . [DISCONTINUED] metolazone (ZAROXOLYN) 2.5 MG tablet Take only as directed by the CHF clinic  . [DISCONTINUED]  Morphine Sulfate (MORPHINE CONCENTRATE) 10 mg / 0.5 ml concentrated solution Take 0.5  mLs (10 mg total) by mouth every 12 (twelve) hours as needed for severe pain or shortness of breath.  . [DISCONTINUED] ondansetron (ZOFRAN-ODT) 8 MG disintegrating tablet Take 1 tablet (8 mg total) by mouth every 8 (eight) hours as needed for nausea or vomiting.  . [DISCONTINUED] sennosides (SENOKOT) 8.8 MG/5ML syrup Take 10 mLs by mouth at bedtime.   No facility-administered encounter medications on file as of 09/26/2019.    Review of Systems  Constitutional: Positive for malaise/fatigue and weight loss. Negative for chills and fever.  HENT: Negative for congestion and sore throat.        Dysphagia for pills and some food  Eyes: Negative for blurred vision.  Respiratory: Positive for shortness of breath. Negative for cough.        Hiccups  Cardiovascular: Positive for leg swelling. Negative for chest pain and palpitations.  Gastrointestinal: Positive for abdominal pain and constipation. Negative for blood in stool and melena.  Genitourinary: Negative for dysuria.  Musculoskeletal: Positive for back pain. Negative for falls and joint pain.  Skin: Negative for itching and rash.  Neurological: Positive for dizziness. Negative for loss of consciousness.  Endo/Heme/Allergies: Bruises/bleeds easily.  Psychiatric/Behavioral: Positive for depression. Negative for memory loss. The patient does not have insomnia.     Vitals:   09/28/19 1129  BP: (!) 94/50  Pulse: 68  Temp: (!) 97 F (36.1 C)  SpO2: 97%  Weight: 149 lb 3.2 oz (67.7 kg)  Height: _0  (1.778 m)   Body mass index is 21.41 kg/m. Physical Exam Vitals reviewed.  Constitutional:      General: He is not in acute distress.    Appearance: He is ill-appearing.     Comments: Cachectic male sitting up in chair  HENT:     Head: Normocephalic and atraumatic.     Right Ear: External ear normal.     Left Ear: External ear normal.      Mouth/Throat:     Mouth: Mucous membranes are dry.     Pharynx: Oropharynx is clear.  Eyes:     General: Scleral icterus present.  Cardiovascular:     Rate and Rhythm: Rhythm irregular.     Pulses: Normal pulses.     Heart sounds: Normal heart sounds.  Pulmonary:     Effort: Pulmonary effort is normal.     Breath sounds: Rales present.  Abdominal:     General: Bowel sounds are normal. There is distension.     Tenderness: There is no abdominal tenderness. There is no guarding or rebound.     Comments: hepatomegaly  Musculoskeletal:        General: Normal range of motion.     Cervical back: Neck supple.     Right lower leg: Edema present.     Left lower leg: Edema present.  Skin:    General: Skin is warm and dry.     Coloration: Skin is jaundiced.     Comments: Mild jaundice  Neurological:     General: No focal deficit present.     Mental Status: He is alert.     Motor: Weakness present.  Psychiatric:        Mood and Affect: Mood normal.        Behavior: Behavior normal.        Thought Content: Thought content normal.        Judgment: Judgment normal.     Labs reviewed: Basic Metabolic Panel: Recent Labs    04/25/19 1448 05/25/19 1152  09/03/19 1430 09/03/19 1430 09/05/19 1412 09/14/19 0912 09/22/19 0000  NA 131*   < > 129*   < > 131* 128* 126*  K 3.9   < > 4.5   < > 4.5 4.1 4.3  CL 95*   < > 88*   < > 91* 91* 81*  CO2 25   < > 27   < > 28 25 28*  GLUCOSE 105*   < > 69*  --  96 102*  --   BUN 18   < > 23   < > 24* 20 25*  CREATININE 0.92   < > 1.33*   < > 1.44* 1.11 0.9  CALCIUM 8.8*   < > 8.6*   < > 8.8* 8.7* 9.2  MG 2.4  --   --   --   --   --   --    < > = values in this interval not displayed.   Liver Function Tests: Recent Labs    06/30/19 1134 07/31/19 1236 09/05/19 1412  AST 69* 71* 94*  ALT 27 22 42  ALKPHOS 119 117 254*  BILITOT 1.2 0.7 1.6*  PROT 8.2* 7.4 7.3  ALBUMIN 4.1 3.2* 3.0*   No results for input(s): LIPASE, AMYLASE in the last  8760 hours. No results for input(s): AMMONIA in the last 8760 hours. CBC: Recent Labs    08/14/19 1450 08/14/19 1450 09/03/19 1430 09/05/19 1412 09/14/19 0912  WBC 6.3   < > SPECIMEN CLOTTED 7.9 6.1  NEUTROABS 4.4  --  SPECIMEN CLOTTED 5.7  --   HGB 10.0*   < > SPECIMEN CLOTTED 11.5* 11.5*  HCT 30.3*   < > SPECIMEN CLOTTED 34.8* 35.5*  MCV 90.7   < > SPECIMEN CLOTTED 89.7 93.2  PLT 298   < > SPECIMEN CLOTTED 293 288   < > = values in this interval not displayed.   Cardiac Enzymes: No results for input(s): CKTOTAL, CKMB, CKMBINDEX, TROPONINI in the last 8760 hours. BNP: Invalid input(s): POCBNP Lab Results  Component Value Date   HGBA1C 5.5 08/17/2017   Lab Results  Component Value Date   TSH 2.043 12/05/2014   Lab Results  Component Value Date   VITAMINB12 1,311 (H) 08/14/2019   Lab Results  Component Value Date   FOLATE 23.0 08/14/2019   Lab Results  Component Value Date   IRON 41 (L) 08/14/2019   TIBC 283 08/14/2019   FERRITIN 105 08/14/2019    Imaging and Procedures obtained prior to SNF admission: No results found.  Assessment/Plan 1. Metastatic colon cancer to liver [facility]) -he is here for hospice care -he has accepted that his prognosis is poor and he does not want to suffer in pain -using tramadol right now for pain -discussed how he may need stronger meds for his comfort later and they could make him less arousable and interactive -he is still hesitant about this at this stage  2. Chronic systolic heart failure (HCC) -cont torsemide to help with respiratory status at this point, but intake very poor -did not tolerate morphine for comfort initially  3. Constipation, unspecified constipation type -will add colace liquid since he struggles with tablets -keep supps on hand if needed  4. Abdominal swelling -due to his colon ca with mets to liver, ascites  5. Pulmonary hypertension ([place]) -longstanding issue with dyspnea   6. Paroxysmal atrial  fibrillation (HCC) -rate controlled, cont meds as tolerates for now  7. Failure to thrive in adult -intake  quite poor with considerable weight loss -due to colon cancer  Family/ staff Communication: discussed with pt, rehab nursing  Labs/tests ordered:  Has one set of f/u labs from hospital at one week--cbc, bmp  Hasset Chaviano L. Joniya Boberg, D.O. [facility] [address], [place] [address] Cell Phone (Mon-Fri 8am-5pm):  [phone] On Call:  [phone] & follow prompts after 5pm & weekends Office Phone:  [phone] Office Fax:  [phone]

## 2019-09-27 DIAGNOSIS — R627 Adult failure to thrive: Secondary | ICD-10-CM | POA: Diagnosis not present

## 2019-09-27 DIAGNOSIS — C7989 Secondary malignant neoplasm of other specified sites: Secondary | ICD-10-CM | POA: Diagnosis not present

## 2019-09-27 DIAGNOSIS — D630 Anemia in neoplastic disease: Secondary | ICD-10-CM | POA: Diagnosis not present

## 2019-09-27 DIAGNOSIS — C19 Malignant neoplasm of rectosigmoid junction: Secondary | ICD-10-CM | POA: Diagnosis not present

## 2019-09-27 DIAGNOSIS — E46 Unspecified protein-calorie malnutrition: Secondary | ICD-10-CM | POA: Diagnosis not present

## 2019-09-27 DIAGNOSIS — C787 Secondary malignant neoplasm of liver and intrahepatic bile duct: Secondary | ICD-10-CM | POA: Diagnosis not present

## 2019-09-28 ENCOUNTER — Other Ambulatory Visit: Payer: Self-pay | Admitting: Internal Medicine

## 2019-09-28 ENCOUNTER — Encounter: Payer: Self-pay | Admitting: Internal Medicine

## 2019-09-28 DIAGNOSIS — R110 Nausea: Secondary | ICD-10-CM

## 2019-09-28 DIAGNOSIS — C189 Malignant neoplasm of colon, unspecified: Secondary | ICD-10-CM

## 2019-09-28 MED ORDER — DRONABINOL 2.5 MG PO CAPS: 2.5000 mg | ORAL_CAPSULE | Freq: Two times a day (BID) | ORAL | 0 refills | Status: DC

## 2019-09-29 DIAGNOSIS — D630 Anemia in neoplastic disease: Secondary | ICD-10-CM | POA: Diagnosis not present

## 2019-09-29 DIAGNOSIS — R627 Adult failure to thrive: Secondary | ICD-10-CM | POA: Diagnosis not present

## 2019-09-29 DIAGNOSIS — E46 Unspecified protein-calorie malnutrition: Secondary | ICD-10-CM | POA: Diagnosis not present

## 2019-09-29 DIAGNOSIS — C19 Malignant neoplasm of rectosigmoid junction: Secondary | ICD-10-CM | POA: Diagnosis not present

## 2019-09-29 DIAGNOSIS — C787 Secondary malignant neoplasm of liver and intrahepatic bile duct: Secondary | ICD-10-CM | POA: Diagnosis not present

## 2019-09-29 DIAGNOSIS — C7989 Secondary malignant neoplasm of other specified sites: Secondary | ICD-10-CM | POA: Diagnosis not present

## 2019-10-02 ENCOUNTER — Encounter: Payer: Self-pay | Admitting: Adult Health

## 2019-10-02 ENCOUNTER — Non-Acute Institutional Stay (SKILLED_NURSING_FACILITY): Payer: Medicare Other | Admitting: Adult Health

## 2019-10-02 DIAGNOSIS — R110 Nausea: Secondary | ICD-10-CM | POA: Diagnosis not present

## 2019-10-02 DIAGNOSIS — C19 Malignant neoplasm of rectosigmoid junction: Secondary | ICD-10-CM | POA: Diagnosis not present

## 2019-10-02 DIAGNOSIS — I5022 Chronic systolic (congestive) heart failure: Secondary | ICD-10-CM

## 2019-10-02 DIAGNOSIS — E46 Unspecified protein-calorie malnutrition: Secondary | ICD-10-CM | POA: Diagnosis not present

## 2019-10-02 DIAGNOSIS — C787 Secondary malignant neoplasm of liver and intrahepatic bile duct: Secondary | ICD-10-CM

## 2019-10-02 DIAGNOSIS — Z7189 Other specified counseling: Secondary | ICD-10-CM | POA: Diagnosis not present

## 2019-10-02 DIAGNOSIS — K5903 Drug induced constipation: Secondary | ICD-10-CM

## 2019-10-02 DIAGNOSIS — C7989 Secondary malignant neoplasm of other specified sites: Secondary | ICD-10-CM | POA: Diagnosis not present

## 2019-10-02 DIAGNOSIS — D630 Anemia in neoplastic disease: Secondary | ICD-10-CM | POA: Diagnosis not present

## 2019-10-02 DIAGNOSIS — R066 Hiccough: Secondary | ICD-10-CM | POA: Diagnosis not present

## 2019-10-02 DIAGNOSIS — C189 Malignant neoplasm of colon, unspecified: Secondary | ICD-10-CM | POA: Diagnosis not present

## 2019-10-02 DIAGNOSIS — R627 Adult failure to thrive: Secondary | ICD-10-CM | POA: Diagnosis not present

## 2019-10-02 MED ORDER — OXYCODONE HCL 5 MG/5ML PO SOLN: 5.0000 mg | Freq: Four times a day (QID) | ORAL | 0 refills | Status: DC | PRN

## 2019-10-02 NOTE — Progress Notes (Signed)
Location:  Occupational psychologist of Service:  SNF (31) Provider:   Cindi Carbon, [place] [phone]   Hoyt Koch, MD  Patient Care Team: Hoyt Koch, MD as PCP - General (Internal Medicine) Elsie Stain, MD (Pulmonary Disease) Deboraha Sprang, MD as Consulting Physician (Cardiology) Jonnie Finner, RN as Oncology Nurse Navigator  Extended Emergency Contact Information Primary Emergency Contact: Rossy,Carol Address: [address]          [place], [place] [address] [name] of [place] Phone: [phone] Mobile Phone: [phone] Relation: Spouse Secondary Emergency Contact: Justice [place] Phone: [phone] Mobile Phone: [phone] Relation: Son Interpreter needed? No  Code Status:  DNR Goals of care: Advanced Directive information Advanced Directives 09/28/2019  Does Patient Have a Medical Advance Directive? Yes  Type of Paramedic of [place];Living will;Out of facility DNR (pink MOST or yellow form)  Does patient want to make changes to medical advance directive? No - Guardian declined  Copy of [place] in Chart? Yes - validated most recent copy scanned in chart (See row information)  Would patient like information on creating a medical advance directive? -  Pre-existing out of facility DNR order (yellow form or pink MOST form) Pink MOST/Yellow Form most recent copy in chart - Physician notified to receive inpatient order     Chief Complaint  Patient presents with  . Acute Visit    seeing things, hiccups    HPI:  Pt is a 79 y.o. male seen today for an acute visit for seeing things and hiccups. He is currently residing in the [facility] due to progressive decline associated with metastatic colon cancer with liver mets. He is having some swelling in his feet but no abd bloating. Has DOE and progressive  weakness and requires more assistance.  His main issue is chronic hiccups. He is using ativan for this, as well as atarax. He has tried thorazine but had dizziness and confusion with this med. He also has some nausea but no vomiting and is using zofran. He has some abd pain and uses ultram which he says helps. He is having constipation as well. He is followed by hospice care and desires for comfort measures only and no hospitalizations. He reports that he is not ready to stop his cardiac meds and wants to take some supplements at home for his heart. He would like his meds spaced out more because if he swallow too much at one time the hiccups get worse. He also wants a more comfortable bed. The nurse has tried an air mattress and top cover. His wife is at the bedside and has questions about his care.   He is not eating well and sleeping more. He reports that he can't take the morphine due to s/e of confusion, dizziness, etc.    Past Medical History:  Diagnosis Date  . Allergic rhinitis   . Atrial fibrillation ([place])   . Atypical pneumonia   . CAD (coronary artery disease)   . Cardiomyopathy, ischemic   . Chronic anticoagulation   . Cough   . Dizziness   . GERD (gastroesophageal reflux disease)   . Heart failure, systolic, acute on chronic ([place])   . Hyperlipidemia   . Hypertension   . OSA (obstructive sleep apnea)    Home sleep test 07/05/2009 AHI 8.2  . Pleural effusion   . Positional vertigo   . TIA (transient  ischemic attack)    Past Surgical History:  Procedure Laterality Date  . AORTIC VALVE REPAIR  01/09/13  . CARDIAC CATHETERIZATION N/A 01/10/2016   Procedure: Right Heart Cath;  Surgeon: Larey Dresser, MD;  Location: [facility];  Service: Cardiovascular;  Laterality: N/A;  . CORONARY ARTERY BYPASS GRAFT  01/09/13   LAD LIMA, left atrial appendage  . CORONARY STENT PLACEMENT  2004   LAD  . MITRAL VALVE ANNULOPLASTY  01/09/13  . RIGHT HEART CATH N/A 11/16/2017    Procedure: RIGHT HEART CATH;  Surgeon: Larey Dresser, MD;  Location: [facility];  Service: Cardiovascular;  Laterality: N/A;  . RIGHT HEART CATHETERIZATION N/A 05/11/2014   Procedure: RIGHT HEART CATH;  Surgeon: Larey Dresser, MD;  [facility];  Service: Cardiovascular;  Laterality: N/A;  . SHOULDER SURGERY    . TEE WITHOUT CARDIOVERSION N/A 09/21/2012   Procedure: TRANSESOPHAGEAL ECHOCARDIOGRAM (TEE);  Surgeon: Larey Dresser, MD;  Location: [facility];  Service: Cardiovascular;  Laterality: N/A;    Allergies  Allergen Reactions  . Carvedilol Other (See Comments)    Dizziness, uneasiness, alopecia  . Codeine     Rapid thoughts, hallucinations  . Opsumit [Macitentan] Swelling    Per patient, caused extreme ankle swelling.   . Dabigatran Rash  . Pradaxa [Dabigatran Etexilate Mesylate] Rash  . Sulfonamide Derivatives Rash  . Xarelto [Rivaroxaban] Rash    Outpatient Encounter Medications as of 10/02/2019  Medication Sig  . acetaminophen (TYLENOL) 500 MG tablet Take 1,000 mg by mouth every 4 (four) hours as needed.  . docusate (COLACE) 50 MG/5ML liquid Take by mouth 2 (two) times daily.  Marland Kitchen glycerin adult 2 g suppository Place 1 suppository rectally as needed for constipation.  . melatonin 3 MG TABS tablet Take 3 mg by mouth at bedtime.  . metoCLOPramide (REGLAN) 5 MG tablet Take 5 mg by mouth 4 (four) times daily.  . ondansetron (ZOFRAN) 4 MG tablet Take 4 mg by mouth every 6 (six) hours as needed for nausea or vomiting.  Marland Kitchen oxyCODONE (ROXICODONE) 5 MG/5ML solution Take 5 mLs (5 mg total) by mouth every 6 (six) hours as needed for severe pain.  Marland Kitchen senna (SENOKOT) 8.6 MG TABS tablet Take 2 tablets by mouth in the morning and at bedtime.  . traMADol (ULTRAM) 50 MG tablet Take 50 mg by mouth 2 (two) times daily.  . [DISCONTINUED] oxyCODONE (ROXICODONE) 5 MG/5ML solution Take 5 mg by mouth every 6 (six) hours as needed for severe pain.  Marland Kitchen digoxin (LANOXIN) 0.125 MG  tablet Take 1 tablet (0.125 mg total) by mouth daily. (Patient taking differently: Take 0.0625 mg by mouth daily. )  . dronabinol (MARINOL) 2.5 MG capsule Take 1 capsule (2.5 mg total) by mouth 2 (two) times daily before a meal.  . ENTRESTO 24-26 MG TAKE 1 TABLET BY MOUTH 2 (TWO) TIMES DAILY.  Marland Kitchen LORazepam (ATIVAN) 2 MG/ML concentrated solution Take 0.3 mLs (0.6 mg total) by mouth every 4 (four) hours as needed for anxiety.  . potassium chloride SA (KLOR-CON) 20 MEQ tablet Take 1 tablet by mouth every other day.   . prochlorperazine (COMPAZINE) 10 MG tablet Take 1 tablet (10 mg total) by mouth every 6 (six) hours as needed for nausea or vomiting.  . torsemide (DEMADEX) 20 MG tablet Take 2 tablets (40 mg total) by mouth 2 (two) times daily. (Patient taking differently: Take 60 mg by mouth 2 (two) times daily. Take 60 mg in  the am and 20 mg in the pm)  . traMADol (ULTRAM) 50 MG tablet 1 to 2 tablets PO QID prn, Pain (Patient taking differently: Take 50 mg by mouth 4 (four) times daily as needed. )  . [DISCONTINUED] acetaminophen (TYLENOL) 160 MG/5ML solution Take 15.6 mLs (500 mg total) by mouth every 6 (six) hours as needed for moderate pain or fever.  . [DISCONTINUED] metolazone (ZAROXOLYN) 2.5 MG tablet Take only as directed by the CHF clinic  . [DISCONTINUED] Morphine Sulfate (MORPHINE CONCENTRATE) 10 mg / 0.5 ml concentrated solution Take 0.5 mLs (10 mg total) by mouth every 12 (twelve) hours as needed for severe pain or shortness of breath.  . [DISCONTINUED] ondansetron (ZOFRAN-ODT) 8 MG disintegrating tablet Take 1 tablet (8 mg total) by mouth every 8 (eight) hours as needed for nausea or vomiting.  . [DISCONTINUED] sennosides (SENOKOT) 8.8 MG/5ML syrup Take 10 mLs by mouth at bedtime.   No facility-administered encounter medications on file as of 10/02/2019.    Review of Systems  Constitutional: Positive for activity change, appetite change and fatigue. Negative for chills, diaphoresis and  fever.  HENT: Negative for congestion.   Respiratory: Positive for shortness of breath (on exertion). Negative for cough, wheezing and stridor.   Cardiovascular: Positive for leg swelling. Negative for chest pain.  Gastrointestinal: Positive for abdominal pain, constipation and nausea. Negative for abdominal distention, anal bleeding, blood in stool, diarrhea, rectal pain and vomiting.  Genitourinary: Negative for difficulty urinating and dysuria.  Musculoskeletal: Positive for gait problem. Negative for arthralgias and back pain.  Neurological: Positive for weakness. Negative for tremors, syncope and speech difficulty.  Psychiatric/Behavioral: Positive for confusion and hallucinations. Negative for agitation, behavioral problems, decreased concentration and dysphoric mood.    Immunization History  Administered Date(s) Administered  . Fluad Quad(high Dose 65+) 12/13/2018  . Influenza Split 12/24/2014  . Influenza Whole 12/17/2008, 01/27/2010, 12/21/2011  . Influenza, High Dose Seasonal PF 01/08/2016, 12/28/2016, 01/13/2018  . Influenza,inj,Quad PF,6+ Mos 11/25/2012, 01/09/2014  . Influenza-Unspecified 12/22/2014  . PFIZER SARS-COV-2 Vaccination 04/18/2019, 05/09/2019  . Pneumococcal Polysaccharide-23 05/21/2008  . Tdap 10/20/2012   Pertinent  Health Maintenance Due  Topic Date Due  . PNA vac Low Risk Adult (2 of 2 - PCV13) 05/21/2009  . INFLUENZA VACCINE  10/22/2019   Fall Risk  12/27/2018 01/19/2018 11/30/2017 10/22/2017 02/27/2016  Falls in the past year? 0 No No No No  Comment - - - Emmi Telephone Survey: data to providers prior to load Emmi Telephone Survey: data to providers prior to load  Risk for fall due to : - - Other (Comment) - -  Risk for fall due to: Comment - - Lightheadedness. Osteoarthritis- hips. - -  Follow up Falls prevention discussed - - - -   Functional Status Survey:    There were no vitals filed for this visit. There is no height or weight on file to  calculate BMI. Physical Exam Constitutional:      General: He is not in acute distress.    Appearance: He is not diaphoretic.  HENT:     Head: Normocephalic and atraumatic.     Mouth/Throat:     Mouth: Mucous membranes are moist.     Pharynx: Oropharynx is clear. No oropharyngeal exudate.  Neck:     Thyroid: No thyromegaly.     Vascular: No JVD.     Trachea: No tracheal deviation.  Cardiovascular:     Rate and Rhythm: Normal rate and regular rhythm.  Heart sounds: No murmur heard.   Pulmonary:     Effort: Pulmonary effort is normal. No respiratory distress.     Breath sounds: Normal breath sounds. No wheezing.  Abdominal:     General: Bowel sounds are normal. There is no distension.     Palpations: Abdomen is soft. There is mass (RUQ).     Tenderness: There is no abdominal tenderness. There is no right CVA tenderness, left CVA tenderness, guarding or rebound.  Musculoskeletal:     Right lower leg: Edema present.     Left lower leg: Edema present.  Lymphadenopathy:     Cervical: No cervical adenopathy.  Skin:    General: Skin is warm and dry.     Coloration: Skin is jaundiced.  Neurological:     Mental Status: He is alert and oriented to person, place, and time.     Cranial Nerves: No cranial nerve deficit.  Psychiatric:        Mood and Affect: Mood normal.     Labs reviewed: Recent Labs    04/25/19 1448 05/25/19 1152 09/03/19 1430 09/03/19 1430 09/05/19 1412 09/14/19 0912 09/22/19 0000  NA 131*   < > 129*   < > 131* 128* 126*  K 3.9   < > 4.5   < > 4.5 4.1 4.3  CL 95*   < > 88*   < > 91* 91* 81*  CO2 25   < > 27   < > 28 25 28*  GLUCOSE 105*   < > 69*  --  96 102*  --   BUN 18   < > 23   < > 24* 20 25*  CREATININE 0.92   < > 1.33*   < > 1.44* 1.11 0.9  CALCIUM 8.8*   < > 8.6*   < > 8.8* 8.7* 9.2  MG 2.4  --   --   --   --   --   --    < > = values in this interval not displayed.   Recent Labs    06/30/19 1134 07/31/19 1236 09/05/19 1412  AST 69*  71* 94*  ALT 27 22 42  ALKPHOS 119 117 254*  BILITOT 1.2 0.7 1.6*  PROT 8.2* 7.4 7.3  ALBUMIN 4.1 3.2* 3.0*   Recent Labs    08/14/19 1450 08/14/19 1450 09/03/19 1430 09/05/19 1412 09/14/19 0912  WBC 6.3   < > SPECIMEN CLOTTED 7.9 6.1  NEUTROABS 4.4  --  SPECIMEN CLOTTED 5.7  --   HGB 10.0*   < > SPECIMEN CLOTTED 11.5* 11.5*  HCT 30.3*   < > SPECIMEN CLOTTED 34.8* 35.5*  MCV 90.7   < > SPECIMEN CLOTTED 89.7 93.2  PLT 298   < > SPECIMEN CLOTTED 293 288   < > = values in this interval not displayed.   Lab Results  Component Value Date   TSH 2.043 12/05/2014   Lab Results  Component Value Date   HGBA1C 5.5 08/17/2017   Lab Results  Component Value Date   CHOL 206 (H) 10/07/2017   HDL 53 10/07/2017   LDLCALC 136 (H) 10/07/2017   LDLDIRECT 143.9 05/11/2013   TRIG 84 10/07/2017   CHOLHDL 3.9 10/07/2017    Significant Diagnostic Results in last 30 days:  No results found.  Assessment/Plan 1. Metastatic colon cancer to liver [facility]) Showing signs of decline with lethargy, jaundice, weakness, decreased appetite, and fluid retention Will add oxycodone for pain, sob, or hiccups. D/C morphine  from the [facility] as he stated he had side effects when he tried this in the past including confusion.  We discussed that the hallucinations he is having could be due to ativan, marinol, liver issues, or progression of disease. He declined labs such as BMP and ammonia levels. He is leaning toward comfort care at this time. He may have a foley for comfort at needed, order written.   2. Hiccups Will add Reglan 5 mg tid scheduled to help with this issue, along with constipation issues and nausea.  Continue atarax as needed, also may try Oxycodone as above  3. Drug-induced constipation Continue colace, senna, and as needed glycerin supp. See #2  4. Nausea without vomiting See #2  5. Chronic systolic heart failure (HCC) Edematous on exam but eating only small amts. He wants to continue to take  his cardiac meds at this point. He would like his meds spread out throughout the day so he doesn't have to take too much at one time. He has declined hospitalizations. I did not change his diuretics as he seems to be nearing end of life and would not benefit from additional intervention  6. Advanced care planning Dr. Vicente Serene unfortunately has colon cancer with mets to the liver and is followed by hospice. I spoke with him and his wife at the bedside. They asked if he was nearing the end of life and it does appear that he is. He is eating less, sleeping more, having more symptoms, and needs more assistance due to weakness. He desires to be kept comfortable at [facility] with no hospitalizations. He does not want any labs or aggressive care at this time. We discussed that he has many symptoms and sensitivity to several meds making it difficult to find a regimen that works. Our goal is to keep him comfortable and avoid s/e if possible while recognizing that any meds that he takes for symptom relief can cause s/e. Support provided to his wife with appreciation expressed.   I spent 60 minutes in counseling and coordination of care for this resident.   Family/ staff Communication: discussed with the resident and his wife Ms. Kasik   Labs/tests ordered:  Declined lab work at this time due to goals of care

## 2019-10-03 ENCOUNTER — Emergency Department (HOSPITAL_COMMUNITY)
Admission: EM | Admit: 2019-10-03 | Discharge: 2019-10-03 | Disposition: A | Discharge status: Home / Self Care | Attending: Emergency Medicine | Admitting: Emergency Medicine

## 2019-10-03 ENCOUNTER — Non-Acute Institutional Stay (SKILLED_NURSING_FACILITY): Payer: Medicare Other | Admitting: Internal Medicine

## 2019-10-03 ENCOUNTER — Encounter (HOSPITAL_COMMUNITY): Payer: Self-pay | Admitting: Emergency Medicine

## 2019-10-03 ENCOUNTER — Encounter: Payer: Self-pay | Admitting: Internal Medicine

## 2019-10-03 DIAGNOSIS — R339 Retention of urine, unspecified: Secondary | ICD-10-CM | POA: Insufficient documentation

## 2019-10-03 DIAGNOSIS — I4891 Unspecified atrial fibrillation: Secondary | ICD-10-CM | POA: Diagnosis not present

## 2019-10-03 DIAGNOSIS — M2550 Pain in unspecified joint: Secondary | ICD-10-CM | POA: Diagnosis not present

## 2019-10-03 DIAGNOSIS — N401 Enlarged prostate with lower urinary tract symptoms: Secondary | ICD-10-CM | POA: Diagnosis not present

## 2019-10-03 DIAGNOSIS — E871 Hypo-osmolality and hyponatremia: Secondary | ICD-10-CM | POA: Diagnosis not present

## 2019-10-03 DIAGNOSIS — R0902 Hypoxemia: Secondary | ICD-10-CM | POA: Diagnosis not present

## 2019-10-03 DIAGNOSIS — Z951 Presence of aortocoronary bypass graft: Secondary | ICD-10-CM | POA: Insufficient documentation

## 2019-10-03 DIAGNOSIS — Z8673 Personal history of transient ischemic attack (TIA), and cerebral infarction without residual deficits: Secondary | ICD-10-CM | POA: Insufficient documentation

## 2019-10-03 DIAGNOSIS — I110 Hypertensive heart disease with heart failure: Secondary | ICD-10-CM | POA: Diagnosis not present

## 2019-10-03 DIAGNOSIS — K5903 Drug induced constipation: Secondary | ICD-10-CM | POA: Diagnosis not present

## 2019-10-03 DIAGNOSIS — Z8505 Personal history of malignant neoplasm of liver: Secondary | ICD-10-CM | POA: Insufficient documentation

## 2019-10-03 DIAGNOSIS — I5022 Chronic systolic (congestive) heart failure: Secondary | ICD-10-CM | POA: Diagnosis not present

## 2019-10-03 DIAGNOSIS — R066 Hiccough: Secondary | ICD-10-CM

## 2019-10-03 DIAGNOSIS — C189 Malignant neoplasm of colon, unspecified: Secondary | ICD-10-CM | POA: Diagnosis not present

## 2019-10-03 DIAGNOSIS — Z79899 Other long term (current) drug therapy: Secondary | ICD-10-CM | POA: Insufficient documentation

## 2019-10-03 DIAGNOSIS — I2510 Atherosclerotic heart disease of native coronary artery without angina pectoris: Secondary | ICD-10-CM | POA: Insufficient documentation

## 2019-10-03 DIAGNOSIS — I959 Hypotension, unspecified: Secondary | ICD-10-CM | POA: Diagnosis not present

## 2019-10-03 DIAGNOSIS — C7989 Secondary malignant neoplasm of other specified sites: Secondary | ICD-10-CM | POA: Diagnosis not present

## 2019-10-03 DIAGNOSIS — E46 Unspecified protein-calorie malnutrition: Secondary | ICD-10-CM | POA: Diagnosis not present

## 2019-10-03 DIAGNOSIS — I4510 Unspecified right bundle-branch block: Secondary | ICD-10-CM | POA: Diagnosis not present

## 2019-10-03 DIAGNOSIS — I5023 Acute on chronic systolic (congestive) heart failure: Secondary | ICD-10-CM | POA: Diagnosis not present

## 2019-10-03 DIAGNOSIS — R338 Other retention of urine: Secondary | ICD-10-CM | POA: Diagnosis not present

## 2019-10-03 DIAGNOSIS — C19 Malignant neoplasm of rectosigmoid junction: Secondary | ICD-10-CM | POA: Diagnosis not present

## 2019-10-03 DIAGNOSIS — R110 Nausea: Secondary | ICD-10-CM

## 2019-10-03 DIAGNOSIS — D630 Anemia in neoplastic disease: Secondary | ICD-10-CM | POA: Diagnosis not present

## 2019-10-03 DIAGNOSIS — C787 Secondary malignant neoplasm of liver and intrahepatic bile duct: Secondary | ICD-10-CM | POA: Diagnosis not present

## 2019-10-03 DIAGNOSIS — R350 Frequency of micturition: Secondary | ICD-10-CM | POA: Diagnosis present

## 2019-10-03 DIAGNOSIS — R627 Adult failure to thrive: Secondary | ICD-10-CM | POA: Diagnosis not present

## 2019-10-03 DIAGNOSIS — R5381 Other malaise: Secondary | ICD-10-CM | POA: Diagnosis not present

## 2019-10-03 DIAGNOSIS — R52 Pain, unspecified: Secondary | ICD-10-CM | POA: Diagnosis not present

## 2019-10-03 DIAGNOSIS — Z7401 Bed confinement status: Secondary | ICD-10-CM | POA: Diagnosis not present

## 2019-10-03 HISTORY — DX: Malignant (primary) neoplasm, unspecified: C80.1

## 2019-10-03 LAB — CBC WITH DIFFERENTIAL/PLATELET
Abs Immature Granulocytes: 0.03 10*3/uL (ref 0.00–0.07)
Basophils Absolute: 0.1 10*3/uL (ref 0.0–0.1)
Basophils Relative: 1 %
Eosinophils Absolute: 0.2 10*3/uL (ref 0.0–0.5)
Eosinophils Relative: 2 %
HCT: 41.2 % (ref 39.0–52.0)
Hemoglobin: 13.9 g/dL (ref 13.0–17.0)
Immature Granulocytes: 0 %
Lymphocytes Relative: 12 %
Lymphs Abs: 1.2 10*3/uL (ref 0.7–4.0)
MCH: 31.1 pg (ref 26.0–34.0)
MCHC: 33.7 g/dL (ref 30.0–36.0)
MCV: 92.2 fL (ref 80.0–100.0)
Monocytes Absolute: 1 10*3/uL (ref 0.1–1.0)
Monocytes Relative: 10 %
Neutro Abs: 7.4 10*3/uL (ref 1.7–7.7)
Neutrophils Relative %: 75 %
Platelets: 286 10*3/uL (ref 150–400)
RBC: 4.47 MIL/uL (ref 4.22–5.81)
RDW: 17.9 % — ABNORMAL HIGH (ref 11.5–15.5)
WBC: 9.9 10*3/uL (ref 4.0–10.5)
nRBC: 0 % (ref 0.0–0.2)

## 2019-10-03 LAB — URINALYSIS, ROUTINE W REFLEX MICROSCOPIC
Bacteria, UA: NONE SEEN
Bilirubin Urine: NEGATIVE
Glucose, UA: NEGATIVE mg/dL
Ketones, ur: NEGATIVE mg/dL
Leukocytes,Ua: NEGATIVE
Nitrite: NEGATIVE
Protein, ur: NEGATIVE mg/dL
Specific Gravity, Urine: 1.008 (ref 1.005–1.030)
pH: 6 (ref 5.0–8.0)

## 2019-10-03 LAB — BASIC METABOLIC PANEL
Anion gap: 13 (ref 5–15)
BUN: 25 mg/dL — ABNORMAL HIGH (ref 8–23)
CO2: 30 mmol/L (ref 22–32)
Calcium: 8.5 mg/dL — ABNORMAL LOW (ref 8.9–10.3)
Chloride: 79 mmol/L — ABNORMAL LOW (ref 98–111)
Creatinine, Ser: 1.28 mg/dL — ABNORMAL HIGH (ref 0.61–1.24)
GFR calc Af Amer: >60 mL/min (ref >60)
GFR calc non Af Amer: 53 mL/min — ABNORMAL LOW (ref >60)
Glucose, Bld: 95 mg/dL (ref 70–99)
Potassium: 4.6 mmol/L (ref 3.5–5.1)
Sodium: 122 mmol/L — ABNORMAL LOW (ref 135–145)

## 2019-10-03 MED ORDER — OXYCODONE HCL 5 MG/5ML PO SOLN
5.0000 mg | Freq: Once | ORAL | Status: AC
  Administered 2019-10-03: 5 mg via ORAL
  Filled 2019-10-03: qty 5

## 2019-10-03 MED ORDER — OXYCODONE HCL 5 MG/5ML PO SOLN: 10.0000 mg | Freq: Three times a day (TID) | ORAL | 0 refills | Status: DC

## 2019-10-03 MED ORDER — OXYCODONE HCL 5 MG/5ML PO SOLN: 5.0000 mg | ORAL | 0 refills | Status: AC | PRN

## 2019-10-03 NOTE — Progress Notes (Signed)
Location:  [facility] Room Number: 155 Place of Service:  SNF (31) Provider:  Maddyson Keil L. Mariea Clonts, D.O., C.M.D.  Hoyt Koch, MD  Patient Care Team: Hoyt Koch, MD as PCP - General (Internal Medicine) Elsie Stain, MD (Pulmonary Disease) Deboraha Sprang, MD as Consulting Physician (Cardiology) Jonnie Finner, RN as Oncology Nurse Navigator  Extended Emergency Contact Information Primary Emergency Contact: Caleb [name],Caleb [name] Address: [address]          [place], [place] [address] [name] of [place] Phone: [phone] Mobile Phone: [phone] Relation: Spouse Secondary Emergency Contact: Justice [place] Phone: [phone] Mobile Phone: [phone] Relation: Son Interpreter needed? No  Code Status:  DNR Goals of care: Advanced Directive information Advanced Directives 09/28/2019  Does Patient Have a Medical Advance Directive? Yes  Type of Paramedic of [place];Living will;Out of facility DNR (pink MOST or yellow form)  Does patient want to make changes to medical advance directive? No - Guardian declined  Copy of [place] in Chart? Yes - validated most recent copy scanned in chart (See row information)  Would patient like information on creating a medical advance directive? -  Pre-existing out of facility DNR order (yellow form or pink MOST form) Pink MOST/Yellow Form most recent copy in chart - Physician notified to receive inpatient order     Chief Complaint  Patient presents with  . Acute Visit    Cathether placement    HPI:  Pt is a 79 y.o. male with metastatic colon cancer to his liver, ascites, hyponatremia seen today for an acute visit for urinary retention, ongoing difficult to control pain, hiccups, dyspnea and nausea.  Initially, when I saw him, Caleb [name] described wanting his wife to be more accepting of his desire to  pass away.  He himself was ready to go last Tuesday already.  I visited with his wife today who had not been present for the initial visit we had.  She expressed that she now accepts that it's his decision and he is not comfortable as he is.  She knows that medications that make him comfortable may make him less responsive, confused or unresponsive, but she will accept that because it's his decision, not hers.  They've been married 40 years and she does not want him to go, but says she knows that is selfish.  I did share with him that we'd had this conversation and he's now open to more medications for comfort.  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  Unfortunately, over the course of today, he's developed urinary retention of at least 800cc on bladder scan here.  Nursing has tried a few times now to get the catheter with different tricks and maneuvers to no avail.  Urology office could not get him in for foley insertion and [facility] transportation was not available to their office either way.  We are sending him to the ED via EMS just for the catheter insertion.  He can then return for continued hospice care here at [facility].    Past Medical History:  Diagnosis Date  . Allergic rhinitis   . Atrial fibrillation ([place])   . Atypical pneumonia   . CAD (coronary artery disease)   . Cardiomyopathy, ischemic   . Chronic anticoagulation   . Cough   . Dizziness   .  GERD (gastroesophageal reflux disease)   . Heart failure, systolic, acute on chronic ([place])   . Hyperlipidemia   . Hypertension   . OSA (obstructive sleep apnea)    Home sleep test 07/05/2009 AHI 8.2  . Pleural effusion   . Positional vertigo   . TIA (transient ischemic attack)    Past Surgical History:  Procedure Laterality Date  . AORTIC VALVE REPAIR  01/09/13  . CARDIAC CATHETERIZATION N/A 01/10/2016   Procedure: Right  Heart Cath;  Surgeon: Larey Dresser, MD;  Location: [facility];  Service: Cardiovascular;  Laterality: N/A;  . CORONARY ARTERY BYPASS GRAFT  01/09/13   LAD LIMA, left atrial appendage  . CORONARY STENT PLACEMENT  2004   LAD  . MITRAL VALVE ANNULOPLASTY  01/09/13  . RIGHT HEART CATH N/A 11/16/2017   Procedure: RIGHT HEART CATH;  Surgeon: Larey Dresser, MD;  Location: [facility];  Service: Cardiovascular;  Laterality: N/A;  . RIGHT HEART CATHETERIZATION N/A 05/11/2014   Procedure: RIGHT HEART CATH;  Surgeon: Larey Dresser, MD;  Location: [facility] CATH LAB;  Service: Cardiovascular;  Laterality: N/A;  . SHOULDER SURGERY    . TEE WITHOUT CARDIOVERSION N/A 09/21/2012   Procedure: TRANSESOPHAGEAL ECHOCARDIOGRAM (TEE);  Surgeon: Larey Dresser, MD;  Location: [facility];  Service: Cardiovascular;  Laterality: N/A;    Allergies  Allergen Reactions  . Carvedilol Other (See Comments)    Dizziness, uneasiness, alopecia  . Codeine     Rapid thoughts, hallucinations  . Opsumit [Macitentan] Swelling    Per patient, caused extreme ankle swelling.   . Dabigatran Rash  . Pradaxa [Dabigatran Etexilate Mesylate] Rash  . Sulfonamide Derivatives Rash  . Xarelto [Rivaroxaban] Rash    Outpatient Encounter Medications as of 10/03/2019  Medication Sig  . acetaminophen (TYLENOL) 500 MG tablet Take 1,000 mg by mouth every 4 (four) hours as needed.  . digoxin (LANOXIN) 0.125 MG tablet Take 1 tablet (0.125 mg total) by mouth daily. (Patient taking differently: Take 0.0625 mg by mouth daily. )  . docusate (COLACE) 50 MG/5ML liquid Take by mouth 2 (two) times daily.  Marland Kitchen dronabinol (MARINOL) 2.5 MG capsule Take 1 capsule (2.5 mg total) by mouth 2 (two) times daily before a meal.  . ENTRESTO 24-26 MG TAKE 1 TABLET BY MOUTH 2 (TWO) TIMES DAILY.  Marland Kitchen glycerin adult 2 g suppository Place 1 suppository rectally as needed for constipation.  Marland Kitchen LORazepam (ATIVAN) 2 MG/ML concentrated solution Take 0.3 mLs  (0.6 mg total) by mouth every 4 (four) hours as needed for anxiety.  . melatonin 3 MG TABS tablet Take 3 mg by mouth at bedtime.  . metoCLOPramide (REGLAN) 5 MG tablet Take 5 mg by mouth 4 (four) times daily.  . ondansetron (ZOFRAN) 4 MG tablet Take 4 mg by mouth every 6 (six) hours as needed for nausea or vomiting.  Marland Kitchen oxyCODONE (ROXICODONE) 5 MG/5ML solution Take 5 mLs (5 mg total) by mouth every 6 (six) hours as needed for severe pain.  . potassium chloride SA (KLOR-CON) 20 MEQ tablet Take 1 tablet by mouth every other day.   . prochlorperazine (COMPAZINE) 10 MG tablet Take 1 tablet (10 mg total) by mouth every 6 (six) hours as needed for nausea or vomiting.  . senna (SENOKOT) 8.6 MG TABS tablet Take 2 tablets by mouth in the morning and at bedtime.  . torsemide (DEMADEX) 20 MG tablet Take 2 tablets (40 mg total) by mouth 2 (two) times daily. (Patient taking  differently: Take 60 mg by mouth 2 (two) times daily. Take 60 mg in the am and 20 mg in the pm)  . traMADol (ULTRAM) 50 MG tablet 1 to 2 tablets PO QID prn, Pain (Patient taking differently: Take 50 mg by mouth 4 (four) times daily as needed. )  . traMADol (ULTRAM) 50 MG tablet Take 50 mg by mouth 2 (two) times daily.   No facility-administered encounter medications on file as of 10/03/2019.    Review of Systems  Constitutional: Positive for malaise/fatigue and weight loss. Negative for chills and fever.       Very little po intake at all  HENT: Negative for congestion.   Eyes:       Glasses  Respiratory: Positive for shortness of breath. Negative for cough.        Hiccups  Cardiovascular: Positive for leg swelling. Negative for chest pain and palpitations.  Gastrointestinal: Positive for abdominal pain. Negative for blood in stool, constipation, diarrhea and melena.  Genitourinary:       Urinary retention, unable to void   Musculoskeletal: Positive for back pain. Negative for falls and joint pain.       In the bed  Neurological:  Negative for dizziness and loss of consciousness.  Psychiatric/Behavioral: Positive for depression. Negative for memory loss. The patient does not have insomnia.     Immunization History  Administered Date(s) Administered  . Fluad Quad(high Dose 65+) 12/13/2018  . Influenza Split 12/24/2014  . Influenza Whole 12/17/2008, 01/27/2010, 12/21/2011  . Influenza, High Dose Seasonal PF 01/08/2016, 12/28/2016, 01/13/2018  . Influenza,inj,Quad PF,6+ Mos 11/25/2012, 01/09/2014  . Influenza-Unspecified 12/22/2014  . PFIZER SARS-COV-2 Vaccination 04/18/2019, 05/09/2019  . Pneumococcal Polysaccharide-23 05/21/2008  . Tdap 10/20/2012   Pertinent  Health Maintenance Due  Topic Date Due  . PNA vac Low Risk Adult (2 of 2 - PCV13) 05/21/2009  . INFLUENZA VACCINE  10/22/2019   Fall Risk  12/27/2018 01/19/2018 11/30/2017 10/22/2017 02/27/2016  Falls in the past year? 0 No No No No  Comment - - - Emmi Telephone Survey: data to providers prior to load Emmi Telephone Survey: data to providers prior to load  Risk for fall due to : - - Other (Comment) - -  Risk for fall due to: Comment - - Lightheadedness. Osteoarthritis- hips. - -  Follow up Falls prevention discussed - - - -      Vitals:   10/03/19 1645  BP: 115/65  Pulse: 82  Temp: 98 F (36.7 C)  Weight: 144 lb 3.2 oz (65.4 kg)  Height: _0  (1.778 m)   Body mass index is 20.69 kg/m. Physical Exam Constitutional:      General: He is in acute distress.     Appearance: He is ill-appearing.     Comments: Awake but lethargic  HENT:     Head: Normocephalic and atraumatic.  Eyes:     Comments: glasses  Cardiovascular:     Rate and Rhythm: Normal rate and regular rhythm.  Pulmonary:     Effort: Pulmonary effort is normal.     Breath sounds: Rales present.     Comments: Actively hiccupping Abdominal:     General: Bowel sounds are normal. There is distension.     Tenderness: There is abdominal tenderness. There is no guarding or rebound.      Comments: Suprapubic mass  Musculoskeletal:     Right lower leg: Edema present.     Left lower leg: Edema present.     Comments:  Cachectic, staying in bed  Skin:    General: Skin is warm and dry.     Coloration: Skin is jaundiced.  Neurological:     Comments: Drowsy, but able to carry on conversation, confused  Psychiatric:        Mood and Affect: Mood normal.    Labs reviewed: Recent Labs    04/25/19 1448 05/25/19 1152 09/03/19 1430 09/03/19 1430 09/05/19 1412 09/14/19 0912 09/22/19 0000  NA 131*   < > 129*   < > 131* 128* 126*  K 3.9   < > 4.5   < > 4.5 4.1 4.3  CL 95*   < > 88*   < > 91* 91* 81*  CO2 25   < > 27   < > 28 25 28*  GLUCOSE 105*   < > 69*  --  96 102*  --   BUN 18   < > 23   < > 24* 20 25*  CREATININE 0.92   < > 1.33*   < > 1.44* 1.11 0.9  CALCIUM 8.8*   < > 8.6*   < > 8.8* 8.7* 9.2  MG 2.4  --   --   --   --   --   --    < > = values in this interval not displayed.   Recent Labs    06/30/19 1134 07/31/19 1236 09/05/19 1412  AST 69* 71* 94*  ALT 27 22 42  ALKPHOS 119 117 254*  BILITOT 1.2 0.7 1.6*  PROT 8.2* 7.4 7.3  ALBUMIN 4.1 3.2* 3.0*   Recent Labs    08/14/19 1450 08/14/19 1450 09/03/19 1430 09/05/19 1412 09/14/19 0912  WBC 6.3   < > SPECIMEN CLOTTED 7.9 6.1  NEUTROABS 4.4  --  SPECIMEN CLOTTED 5.7  --   HGB 10.0*   < > SPECIMEN CLOTTED 11.5* 11.5*  HCT 30.3*   < > SPECIMEN CLOTTED 34.8* 35.5*  MCV 90.7   < > SPECIMEN CLOTTED 89.7 93.2  PLT 298   < > SPECIMEN CLOTTED 293 288   < > = values in this interval not displayed.   Lab Results  Component Value Date   TSH 2.043 12/05/2014   Lab Results  Component Value Date   HGBA1C 5.5 08/17/2017   Lab Results  Component Value Date   CHOL 206 (H) 10/07/2017   HDL 53 10/07/2017   LDLCALC 136 (H) 10/07/2017   LDLDIRECT 143.9 05/11/2013   TRIG 84 10/07/2017   CHOLHDL 3.9 10/07/2017    Assessment/Plan 1. Urinary retention -due to ascites, colon ca with mets -nursing here  unable to place foley despite multiple attempts and best efforts -will send to ED via EMS for this due to 800cc retention (and had more to drink to get pain medications down)  2. Benign prostatic hyperplasia with urinary retention -has known BPH and hd been following with Dr. Amalia Hailey -suspect he's not got this in combo with his mets causing the retention  3. Metastatic colon cancer to liver (HCC) -pain remains an issue with tramadol and oxycodone and marinol -d/c tramadol and marinol due to lack of benefit and challenges with pills -start oxycodone liquid 342m po tid scheduled and [id number] prn breakthrough pain or dyspnea  4. Hiccups -not responding fully to any txs--reglan, oxycodone, compazine -try haldol (did not tolerate years ago, but he's willing to take more things now to be comfortable) -start haldol 42m32mo OR IM q6h prn hallucinations, psychosis, anxiety, hiccups  5. Drug-induced constipation -has colace liquid and supps as needed--nursing did not have concern with this for him at present  6. Nausea without vomiting -on zofran, also has had reglan for this and his hiccups, also has compazine now  7. Chronic systolic heart failure (HCC) -has persistent edema of his feet and legs even when in bed--struggling with po meds and not taking them all so missing torsemide  Advance care planning Spent around 30 minutes meeting with his wife and another 30 minutes with pt discussing current state and medication changes to improve his comfort as he nears end of life--prognosis appears to be less than a week at this point (shared with wife at her request)--she was going to update their son.    Family/ staff Communication: discussed with rehab nurses, nurse manager, patient's wife  Labs/tests ordered:  Sent out for foley placement via EMS when other options were not possible  Elinore Shults L. Israel Werts, D.O. [facility] [address], [place]  [address] Cell Phone (Mon-Fri 8am-5pm):  [phone] On Call:  [phone] & follow prompts after 5pm & weekends Office Phone:  [phone] Office Fax:  [phone]

## 2019-10-03 NOTE — ED Notes (Signed)
Spoke with Erasmo Downer from [facility] who called stating that patient isnt to be admitted. He is end of life with Hospice [facility] and sent to Ed just for foley catheter placement and to be sent back to nursing facility.  Made Dr Zenia Resides aware.

## 2019-10-03 NOTE — ED Notes (Signed)
[facility] contacted for transportation. Paperwork at nurses station.

## 2019-10-03 NOTE — ED Notes (Signed)
Facility updated, [facility] at  Bedside.

## 2019-10-03 NOTE — ED Provider Notes (Signed)
[facility] DEPT Provider Note   CSN: [id number] Arrival date & time: 10/03/19  1657     History Chief Complaint  Patient presents with  . Urinary Frequency    Caleb [name] is a 79 y.o. male.  79 year old male presents with urine retention x2 weeks.  According to EMS, he has been urinating about 100 cc a day.  No fever or back pain.  Does have a history of BPH.  Patient given 100 mcg of fentanyl prior to arrival and transported here.        Past Medical History:  Diagnosis Date  . Allergic rhinitis   . Atrial fibrillation ([place])   . Atypical pneumonia   . CAD (coronary artery disease)   . Cardiomyopathy, ischemic   . Chronic anticoagulation   . Cough   . Dizziness   . GERD (gastroesophageal reflux disease)   . Heart failure, systolic, acute on chronic ([place])   . Hyperlipidemia   . Hypertension   . OSA (obstructive sleep apnea)    Home sleep test 07/05/2009 AHI 8.2  . Pleural effusion   . Positional vertigo   . TIA (transient ischemic attack)     Patient Active Problem List   Diagnosis Date Noted  . Metastatic colon cancer to liver ([place]) 09/05/2019  . Colorectal cancer, stage IV ([place]) 07/03/2019  . Goals of care, counseling/discussion 06/23/2019  . Constipation 06/23/2019  . Metastatic carcinoma to liver ([place]) 06/23/2019  . Abdominal swelling 05/26/2019  . Osteoarthritis 01/01/2017  . Episodic lightheadedness 09/03/2016  . Intractable hiccups 06/10/2015  . Pulmonary hypertension ([place]) 05/24/2014  . Gross hematuria 02/07/2014  . Age-related macular degeneration, dry 08/06/2013  . S/P aortic valve repair 02/09/2013  . S/P MVR (mitral valve repair) 02/09/2013  . S/P CABG x 1 02/09/2013  . Cardiomyopathy ([place]) 01/06/2013  . H/O shoulder surgery 01/06/2013  . Dyspnea on exertion 11/25/2012  . OSA (obstructive sleep apnea) 07/13/2012  . Anxiety 01/26/2011  . Chronic systolic heart failure ([place]) 09/11/2010  . PSA, INCREASED  12/17/2008  . SINUS BRADYCARDIA 08/15/2008  . POSITIONAL VERTIGO 07/13/2008  . GERD 03/11/2008  . Hyperlipidemia 03/07/2008  . Essential hypertension 03/07/2008  . Coronary atherosclerosis 03/07/2008  . ALLERGIC RHINITIS 03/07/2008  . ATRIAL FIBRILLATION 02/14/2007    Past Surgical History:  Procedure Laterality Date  . AORTIC VALVE REPAIR  01/09/13  . CARDIAC CATHETERIZATION N/A 01/10/2016   Procedure: Right Heart Cath;  Surgeon: Larey Dresser, MD;  Location: [facility];  Service: Cardiovascular;  Laterality: N/A;  . CORONARY ARTERY BYPASS GRAFT  01/09/13   LAD LIMA, left atrial appendage  . CORONARY STENT PLACEMENT  2004   LAD  . MITRAL VALVE ANNULOPLASTY  01/09/13  . RIGHT HEART CATH N/A 11/16/2017   Procedure: RIGHT HEART CATH;  Surgeon: Larey Dresser, MD;  Location: [facility];  Service: Cardiovascular;  Laterality: N/A;  . RIGHT HEART CATHETERIZATION N/A 05/11/2014   Procedure: RIGHT HEART CATH;  Surgeon: Larey Dresser, MD;  Location[facility] CATH LAB;  Service: Cardiovascular;  Laterality: N/A;  . SHOULDER SURGERY    . TEE WITHOUT CARDIOVERSION N/A 09/21/2012   Procedure: TRANSESOPHAGEAL ECHOCARDIOGRAM (TEE);  Surgeon: Larey Dresser, MD;  Location: [facility] ENDOSCOPY;  Service: Cardiovascular;  Laterality: N/A;       Family History  Adopted: Yes  Problem Relation Age of Onset  . Diabetes Other        [place]  . Heart disease Neg Hx   .  Hypertension Neg Hx     Social History   Tobacco Use  . Smoking status: Never Smoker  . Smokeless tobacco: Never Used  Vaping Use  . Vaping Use: Never used  Substance Use Topics  . Alcohol use: No  . Drug use: No    Home Medications Prior to Admission medications   Medication Sig Start Date End Date Taking? Authorizing Provider  acetaminophen (TYLENOL) 500 MG tablet Take 1,000 mg by mouth every 4 (four) hours as needed.    [provider]  digoxin (LANOXIN) 0.125 MG tablet Take 0.125 mg by mouth daily.  Special instruction: 1/2 tablet or 62.5 mcg; oral resident reported this change on admission which was made by his cardiologist    [provider]  docusate (COLACE) 50 MG/5ML liquid Take by mouth 2 (two) times daily.    [provider]  dronabinol (MARINOL) 2.5 MG capsule Take 1 capsule (2.5 mg total) by mouth 2 (two) times daily before a meal. 09/28/19   Reed, Tiffany L, DO  ENTRESTO 24-26 MG TAKE 1 TABLET BY MOUTH 2 (TWO) TIMES DAILY. 09/07/19   Larey Dresser, MD  glycerin adult 2 g suppository Place 1 suppository rectally as needed for constipation.    [provider]  LORazepam (ATIVAN) 2 MG/ML concentrated solution Take 0.3 mLs (0.6 mg total) by mouth every 4 (four) hours as needed for anxiety. 09/07/19   Maryanna Shape, NP  melatonin 3 MG TABS tablet Take 3 mg by mouth at bedtime.    [provider]  metoCLOPramide (REGLAN) 5 MG tablet Take 5 mg by mouth 4 (four) times daily.    [provider]  ondansetron (ZOFRAN) 4 MG tablet Take 4 mg by mouth every 6 (six) hours as needed for nausea or vomiting.    [provider]  oxyCODONE (ROXICODONE) 5 MG/5ML solution Take 5 mLs (5 mg total) by mouth every 4 (four) hours as needed for severe pain. 10/03/19   Reed, Tiffany L, DO  oxyCODONE (ROXICODONE) 5 MG/5ML solution Take 10 mLs (10 mg total) by mouth in the morning, at noon, and at bedtime. 10/03/19   Reed, Tiffany L, DO  potassium chloride SA (KLOR-CON) 20 MEQ tablet Take 1 tablet by mouth every other day.  01/24/13   [provider]  prochlorperazine (COMPAZINE) 10 MG tablet Take 1 tablet (10 mg total) by mouth every 6 (six) hours as needed for nausea or vomiting. 09/07/19   Curcio, Roselie Awkward, NP  senna (SENOKOT) 8.6 MG TABS tablet Take 2 tablets by mouth in the morning and at bedtime.    [provider]  torsemide (DEMADEX) 20 MG tablet Take 2 tablets (40 mg total) by mouth 2 (two) times daily. 09/14/19   Larey Dresser, MD    traMADol Veatrice Bourbon) 50 MG tablet 1 to 2 tablets PO QID prn, Pain 09/12/19   Harle Stanford., PA-C    Allergies    Carvedilol, Codeine, Opsumit [macitentan], Dabigatran, Pradaxa [dabigatran etexilate mesylate], Sulfonamide derivatives, and Xarelto [rivaroxaban]  Review of Systems   Review of Systems  All other systems reviewed and are negative.   Physical Exam Updated Vital Signs There were no vitals taken for this visit.  Physical Exam Vitals and nursing note reviewed.  Constitutional:      General: He is not in acute distress.    Appearance: Normal appearance. He is well-developed. He is not toxic-appearing.  HENT:     Head: Normocephalic and atraumatic.  Eyes:  General: Lids are normal.     Conjunctiva/sclera: Conjunctivae normal.     Pupils: Pupils are equal, round, and reactive to light.  Neck:     Thyroid: No thyroid mass.     Trachea: No tracheal deviation.  Cardiovascular:     Rate and Rhythm: Normal rate and regular rhythm.     Heart sounds: Normal heart sounds. No murmur heard.  No gallop.   Pulmonary:     Effort: Pulmonary effort is normal. No respiratory distress.     Breath sounds: Normal breath sounds. No stridor. No decreased breath sounds, wheezing, rhonchi or rales.  Abdominal:     General: Bowel sounds are normal. There is no distension.     Palpations: Abdomen is soft.     Tenderness: There is abdominal tenderness in the suprapubic area. There is no rebound.  Musculoskeletal:        General: No tenderness. Normal range of motion.     Cervical back: Normal range of motion and neck supple.  Skin:    General: Skin is warm and dry.     Findings: No abrasion or rash.     Comments: Jaundice noted  Neurological:     Mental Status: He is alert and oriented to person, place, and time.     GCS: GCS eye subscore is 4. GCS verbal subscore is 5. GCS motor subscore is 6.     Cranial Nerves: No cranial nerve deficit.     Sensory: No sensory deficit.   Psychiatric:        Attention and Perception: He is inattentive.        Mood and Affect: Affect is flat.        Speech: Speech is delayed.     ED Results / Procedures / Treatments   Labs (all labs ordered are listed, but only abnormal results are displayed) Labs Reviewed  URINE CULTURE  CBC WITH DIFFERENTIAL/PLATELET  BASIC METABOLIC PANEL  URINALYSIS, ROUTINE W REFLEX MICROSCOPIC    EKG None  Radiology No results found.  Procedures Procedures (including critical care time)  Medications Ordered in ED Medications - No data to display  ED Course  I have reviewed the triage vital signs and the nursing notes.  Pertinent labs & imaging results that were available during my care of the patient were reviewed by me and considered in my medical decision making (see chart for details).    MDM Rules/Calculators/A&P                          Patient currently on hospice care with comfort measures only.  Does have some worsening hyponatremia but family wishes to have no further interventions done.  Foley catheter placed by nursing and UA negative for infection.  Will discharge back to his facility Final Clinical Impression(s) / ED Diagnoses Final diagnoses:  None    Rx / DC Orders ED Discharge Orders    None       Lacretia Leigh, MD 10/03/19 1929

## 2019-10-03 NOTE — ED Triage Notes (Signed)
Per [facility] pt from [facility] for Urinary retention x 2 weeks due to enlarged prostate. Put out at most 155m estimating 8012min bladder.  Pt is Hospice patient for colon cancer.  Given Fentanyl 100 mcg in route via 20g in right hand Per Facility jaundice normal as well as hiccups.

## 2019-10-04 ENCOUNTER — Other Ambulatory Visit: Payer: Self-pay | Admitting: Internal Medicine

## 2019-10-04 DIAGNOSIS — C189 Malignant neoplasm of colon, unspecified: Secondary | ICD-10-CM

## 2019-10-04 MED ORDER — OXYCODONE HCL 5 MG/5ML PO SOLN: 10.0000 mg | Freq: Three times a day (TID) | ORAL | 0 refills | Status: AC

## 2019-10-05 ENCOUNTER — Non-Acute Institutional Stay (SKILLED_NURSING_FACILITY): Payer: Medicare Other | Admitting: Adult Health

## 2019-10-05 ENCOUNTER — Telehealth: Payer: Self-pay

## 2019-10-05 ENCOUNTER — Encounter: Payer: Self-pay | Admitting: Adult Health

## 2019-10-05 DIAGNOSIS — K5903 Drug induced constipation: Secondary | ICD-10-CM | POA: Diagnosis not present

## 2019-10-05 DIAGNOSIS — C189 Malignant neoplasm of colon, unspecified: Secondary | ICD-10-CM

## 2019-10-05 DIAGNOSIS — C787 Secondary malignant neoplasm of liver and intrahepatic bile duct: Secondary | ICD-10-CM | POA: Diagnosis not present

## 2019-10-05 DIAGNOSIS — R627 Adult failure to thrive: Secondary | ICD-10-CM | POA: Diagnosis not present

## 2019-10-05 DIAGNOSIS — D630 Anemia in neoplastic disease: Secondary | ICD-10-CM | POA: Diagnosis not present

## 2019-10-05 DIAGNOSIS — C19 Malignant neoplasm of rectosigmoid junction: Secondary | ICD-10-CM | POA: Diagnosis not present

## 2019-10-05 DIAGNOSIS — R339 Retention of urine, unspecified: Secondary | ICD-10-CM

## 2019-10-05 DIAGNOSIS — R410 Disorientation, unspecified: Secondary | ICD-10-CM

## 2019-10-05 DIAGNOSIS — E46 Unspecified protein-calorie malnutrition: Secondary | ICD-10-CM | POA: Diagnosis not present

## 2019-10-05 DIAGNOSIS — C7989 Secondary malignant neoplasm of other specified sites: Secondary | ICD-10-CM | POA: Diagnosis not present

## 2019-10-05 LAB — URINE CULTURE: Culture: NO GROWTH

## 2019-10-05 MED ORDER — LORAZEPAM 2 MG/ML PO CONC: 0.5000 mg | Freq: Three times a day (TID) | ORAL | 0 refills | Status: AC

## 2019-10-05 NOTE — Telephone Encounter (Signed)
The dosing should be 0.5 mg tid scheduled of Ativan. This was clarified by Candida Peeling RN with [facility]. There is a glitch in the system regarding prescribing Ativan liquid as it only lets you prescribed 0.6 mg not 0.100m.

## 2019-10-05 NOTE — Progress Notes (Signed)
Location:  Occupational psychologist of Service:  SNF (31) Provider:   Cindi Carbon, ANP [place] [phone]   Gayland Curry, DO  Patient Care Team: Gayland Curry, DO as PCP - General (Geriatric Medicine) Elsie Stain, MD (Pulmonary Disease) Deboraha Sprang, MD as Consulting Physician (Cardiology) Jonnie Finner, RN as Oncology Nurse Navigator  Extended Emergency Contact Information Primary Emergency Contact: Lipinski,Carol Address: [place], [place] [address] [name] of [place] Phone: [phone] Mobile Phone: [phone] Relation: Spouse Secondary Emergency Contact: Justice [place] Phone: [phone] Mobile Phone: [phone] Relation: Son Interpreter needed? No  Code Status:  DNR Goals of care: Advanced Directive information Advanced Directives 10/03/2019  Does Patient Have a Medical Advance Directive? Yes  Type of Paramedic of [place];Living will;Out of facility DNR (pink MOST or yellow form)  Does patient want to make changes to medical advance directive? -  Copy of [place] in Chart? -  Would patient like information on creating a medical advance directive? -  Pre-existing out of facility DNR order (yellow form or pink MOST form) -     Chief Complaint  Patient presents with  . Acute Visit    delirium    HPI:  Pt is a 79 y.o. male seen today for an acute visit for delirium. He is followed by hospice due to metastatic colon cancer to the liver. He continues with weakness, low appetite, abd pain, and jaundice. He has hiccups but this symptom has improved with scheduled reglan and oxycodone. He feels his pain in the abd is well controlled at this time. He has not had a BM in 5 days and does not like to take liquid colace. Last night he was very confused and paranoid. He was also anxious and short of breath. Sats were  normal.  He called 911 last night due to sob and anxiety. His wishes are to be DNR and not go to the hospital. The only reason he went to the ER earlier this week was due to urinary retention due to bph where a foley cath was placed.  The staff talked him out of going to the ER. Oxygen was applied and symptoms improved. He had been refusing to take ativan for anxiety but early this morning he finally agreed which helped. This morning he is more oriented and agrees to more medication to provide comfort.    Past Medical History:  Diagnosis Date  . Allergic rhinitis   . Atrial fibrillation ([place])   . Atypical pneumonia   . CAD (coronary artery disease)   . Cancer ([place])    colon  . Cardiomyopathy, ischemic   . Chronic anticoagulation   . Cough   . Dizziness   . GERD (gastroesophageal reflux disease)   . Heart failure, systolic, acute on chronic ([place])   . Hyperlipidemia   . Hypertension   . OSA (obstructive sleep apnea)    Home sleep test 07/05/2009 AHI 8.2  . Pleural effusion   . Positional vertigo   . TIA (transient ischemic attack)    Past Surgical History:  Procedure Laterality Date  . AORTIC VALVE REPAIR  01/09/13  . CARDIAC CATHETERIZATION N/A 01/10/2016   Procedure: Right Heart Cath;  Surgeon: Larey Dresser, MD;  Location: [facility];  Service: Cardiovascular;  Laterality: N/A;  . CORONARY ARTERY BYPASS GRAFT  01/09/13   LAD LIMA, left atrial appendage  . CORONARY STENT PLACEMENT  2004   LAD  . MITRAL VALVE ANNULOPLASTY  01/09/13  . RIGHT HEART CATH N/A 11/16/2017   Procedure: RIGHT HEART CATH;  Surgeon: Larey Dresser, MD;  Location: [facility];  Service: Cardiovascular;  Laterality: N/A;  . RIGHT HEART CATHETERIZATION N/A 05/11/2014   Procedure: RIGHT HEART CATH;  Surgeon: Larey Dresser, MD;  Location: [facility];  Service: Cardiovascular;  Laterality: N/A;  . SHOULDER SURGERY    . TEE WITHOUT CARDIOVERSION N/A 09/21/2012   Procedure: TRANSESOPHAGEAL  ECHOCARDIOGRAM (TEE);  Surgeon: Larey Dresser, MD;  Location: [facility];  Service: Cardiovascular;  Laterality: N/A;    Allergies  Allergen Reactions  . Carvedilol Other (See Comments)    Dizziness, uneasiness, alopecia  . Codeine     Rapid thoughts, hallucinations  . Opsumit [Macitentan] Swelling    Per patient, caused extreme ankle swelling.   . Dabigatran Rash  . Pradaxa [Dabigatran Etexilate Mesylate] Rash  . Sulfonamide Derivatives Rash  . Xarelto [Rivaroxaban] Rash    Outpatient Encounter Medications as of 10/05/2019  Medication Sig  . bisacodyl (DULCOLAX) 10 MG suppository Place 10 mg rectally every other day.  . haloperidol (HALDOL) 1 MG tablet Take 1 mg by mouth every 6 (six) hours as needed for agitation. May take IM if not able to swallow  . LORazepam (ATIVAN) 2 MG/ML concentrated solution Take 0.3 mLs (0.6 mg total) by mouth in the morning, at noon, and at bedtime.  . [DISCONTINUED] LORazepam (ATIVAN) 2 MG/ML concentrated solution Take 0.5 mg by mouth in the morning, at noon, and at bedtime.  Marland Kitchen acetaminophen (TYLENOL) 500 MG tablet Take 1,000 mg by mouth every 4 (four) hours as needed.  . digoxin (LANOXIN) 0.125 MG tablet Take 0.125 mg by mouth daily. Special instruction: 1/2 tablet or 62.5 mcg; oral resident reported this change on admission which was made by his cardiologist  . [name] 24-26 MG TAKE 1 TABLET BY MOUTH 2 (TWO) TIMES DAILY.  Marland Kitchen glycerin adult 2 g suppository Place 1 suppository rectally as needed for constipation.  Marland Kitchen LORazepam (ATIVAN) 2 MG/ML concentrated solution Take 0.3 mLs (0.6 mg total) by mouth every 4 (four) hours as needed for anxiety.  . melatonin 3 MG TABS tablet Take 3 mg by mouth at bedtime.  . metoCLOPramide (REGLAN) 5 MG tablet Take 5 mg by mouth 4 (four) times daily.  . ondansetron (ZOFRAN) 4 MG tablet Take 4 mg by mouth every 6 (six) hours as needed for nausea or vomiting.  Marland Kitchen oxyCODONE (ROXICODONE) 5 MG/5ML solution Take 5 mLs (5 mg  total) by mouth every 4 (four) hours as needed for severe pain.  Marland Kitchen oxyCODONE (ROXICODONE) 5 MG/5ML solution Take 10 mLs (10 mg total) by mouth in the morning, at noon, and at bedtime.  . potassium chloride SA (KLOR-CON) 20 MEQ tablet Take 1 tablet by mouth every other day.   . prochlorperazine (COMPAZINE) 10 MG tablet Take 1 tablet (10 mg total) by mouth every 6 (six) hours as needed for nausea or vomiting.  . senna (SENOKOT) 8.6 MG TABS tablet Take 2 tablets by mouth in the morning and at bedtime.  . torsemide (DEMADEX) 20 MG tablet Take 2 tablets (40 mg total) by mouth 2 (two) times daily.  . [DISCONTINUED] docusate (COLACE) 50 MG/5ML liquid Take by mouth 2 (two) times daily.  . [DISCONTINUED] traMADol (ULTRAM) 50 MG tablet 1 to 2 tablets PO  QID prn, Pain   No facility-administered encounter medications on file as of 10/05/2019.    Review of Systems  Constitutional: Positive for activity change, appetite change and fatigue. Negative for chills, diaphoresis and fever.  HENT: Negative for congestion.   Respiratory: Positive for shortness of breath (on exertion). Negative for cough, wheezing and stridor.   Cardiovascular: Positive for leg swelling. Negative for chest pain.  Gastrointestinal: Positive for abdominal pain, constipation and nausea. Negative for abdominal distention, anal bleeding, blood in stool, diarrhea, rectal pain and vomiting.  Genitourinary: Negative for difficulty urinating and dysuria.  Musculoskeletal: Positive for gait problem. Negative for arthralgias and back pain.  Neurological: Positive for weakness. Negative for tremors, syncope and speech difficulty.  Psychiatric/Behavioral: Positive for confusion and hallucinations. Negative for agitation, behavioral problems, decreased concentration and dysphoric mood.    Immunization History  Administered Date(s) Administered  . Fluad Quad(high Dose 65+) 12/13/2018  . Influenza Split 12/24/2014  . Influenza Whole 12/17/2008,  01/27/2010, 12/21/2011  . Influenza, High Dose Seasonal PF 01/08/2016, 12/28/2016, 01/13/2018  . Influenza,inj,Quad PF,6+ Mos 11/25/2012, 01/09/2014  . Influenza-Unspecified 12/22/2014  . PFIZER SARS-COV-2 Vaccination 04/18/2019, 05/09/2019  . Pneumococcal Polysaccharide-23 05/21/2008  . Tdap 10/20/2012   Pertinent  Health Maintenance Due  Topic Date Due  . PNA vac Low Risk Adult (2 of 2 - PCV13) 05/21/2009  . INFLUENZA VACCINE  10/22/2019   Fall Risk  12/27/2018 01/19/2018 11/30/2017 10/22/2017 02/27/2016  Falls in the past year? 0 No No No No  Comment - - - Emmi Telephone Survey: data to providers prior to load Emmi Telephone Survey: data to providers prior to load  Risk for fall due to : - - Other (Comment) - -  Risk for fall due to: Comment - - Lightheadedness. Osteoarthritis- hips. - -  Follow up Falls prevention discussed - - - -   Functional Status Survey:    Vitals:   10/05/19 1016  Resp: 20  Temp: 97.9 F (36.6 C)  SpO2: 98%   There is no height or weight on file to calculate BMI. Physical Exam Constitutional:      General: He is not in acute distress.    Appearance: He is not diaphoretic.  HENT:     Head: Normocephalic and atraumatic.     Mouth/Throat:     Mouth: Mucous membranes are moist.     Pharynx: Oropharynx is clear. No oropharyngeal exudate.  Neck:     Thyroid: No thyromegaly.     Vascular: No JVD.     Trachea: No tracheal deviation.  Cardiovascular:     Rate and Rhythm: Normal rate and regular rhythm.     Heart sounds: No murmur heard.   Pulmonary:     Effort: Pulmonary effort is normal. No respiratory distress.     Breath sounds: Normal breath sounds. No wheezing.  Abdominal:     General: Bowel sounds are normal. There is no distension.     Palpations: Abdomen is soft. There is mass (RUQ).     Tenderness: There is no abdominal tenderness. There is no right CVA tenderness, left CVA tenderness, guarding or rebound.  Musculoskeletal:     Right lower  leg: Edema present.     Left lower leg: Edema present.  Lymphadenopathy:     Cervical: No cervical adenopathy.  Skin:    General: Skin is warm and dry.     Coloration: Skin is jaundiced.  Neurological:     Mental Status: He is alert and oriented to person,  place, and time.     Cranial Nerves: No cranial nerve deficit.  Psychiatric:        Mood and Affect: Mood normal.     Labs reviewed: Recent Labs    04/25/19 1448 05/25/19 1152 09/05/19 1412 09/05/19 1412 09/14/19 0912 09/22/19 0000 10/03/19 1740  NA 131*   < > 131*   < > 128* 126* 122*  K 3.9   < > 4.5   < > 4.1 4.3 4.6  CL 95*   < > 91*   < > 91* 81* 79*  CO2 25   < > 28   < > 25 28* 30  GLUCOSE 105*   < > 96  --  102*  --  95  BUN 18   < > 24*   < > 20 25* 25*  CREATININE 0.92   < > 1.44*   < > 1.11 0.9 1.28*  CALCIUM 8.8*   < > 8.8*   < > 8.7* 9.2 8.5*  MG 2.4  --   --   --   --   --   --    < > = values in this interval not displayed.   Recent Labs    06/30/19 1134 07/31/19 1236 09/05/19 1412  AST 69* 71* 94*  ALT 27 22 42  ALKPHOS 119 117 254*  BILITOT 1.2 0.7 1.6*  PROT 8.2* 7.4 7.3  ALBUMIN 4.1 3.2* 3.0*   Recent Labs    09/03/19 1430 09/03/19 1430 09/05/19 1412 09/14/19 0912 10/03/19 1740  WBC SPECIMEN CLOTTED  --  7.9 6.1 9.9  NEUTROABS SPECIMEN CLOTTED  --  5.7  --  7.4  HGB SPECIMEN CLOTTED  --  11.5* 11.5* 13.9  HCT SPECIMEN CLOTTED   < > 34.8* 35.5* 41.2  MCV SPECIMEN CLOTTED   < > 89.7 93.2 92.2  PLT SPECIMEN CLOTTED  --  293 288 286   < > = values in this interval not displayed.   Lab Results  Component Value Date   TSH 2.043 12/05/2014   Lab Results  Component Value Date   HGBA1C 5.5 08/17/2017   Lab Results  Component Value Date   CHOL 206 (H) 10/07/2017   HDL 53 10/07/2017   LDLCALC 136 (H) 10/07/2017   LDLDIRECT 143.9 05/11/2013   TRIG 84 10/07/2017   CHOLHDL 3.9 10/07/2017    Significant Diagnostic Results in last 30 days:  No results  found.  Assessment/Plan  1. Acute delirium Add Ativan 0.5 mg tid scheduled   2. Drug-induced constipation Add dulcolax supp QOD D/C Colace due to dislike   3. Metastatic colon cancer to liver [facility]) Progressing towards end of life with decreased appetite, weakness, jaundice, and increased pain. Continue oxycodone 10 mg tid which seems to help with pain and hiccups.   4. Urinary retention Maintain foley per [facility] protocol. Recommended ordering additional 10 or 12 Fr caths in case we need to replace the one he has as the nurses were not able to place a 14 or 16 Frcath earlier in the week.   Family/ staff Communication: discussed with the resident and his wife Ms. Longo   Labs/tests ordered:  NA

## 2019-10-05 NOTE — Telephone Encounter (Signed)
Ailene Ravel from [facility] - [phone], called about the lorazepam for this patient. She states there was an e-prescribed dosing of 0.6 mg and then a handwritten order from the facility for 0.5 mg dosing. She states she needs to clarify the correct dosing.

## 2019-10-06 DIAGNOSIS — C787 Secondary malignant neoplasm of liver and intrahepatic bile duct: Secondary | ICD-10-CM | POA: Diagnosis not present

## 2019-10-06 DIAGNOSIS — R627 Adult failure to thrive: Secondary | ICD-10-CM | POA: Diagnosis not present

## 2019-10-06 DIAGNOSIS — C7989 Secondary malignant neoplasm of other specified sites: Secondary | ICD-10-CM | POA: Diagnosis not present

## 2019-10-06 DIAGNOSIS — D630 Anemia in neoplastic disease: Secondary | ICD-10-CM | POA: Diagnosis not present

## 2019-10-06 DIAGNOSIS — E46 Unspecified protein-calorie malnutrition: Secondary | ICD-10-CM | POA: Diagnosis not present

## 2019-10-06 DIAGNOSIS — C19 Malignant neoplasm of rectosigmoid junction: Secondary | ICD-10-CM | POA: Diagnosis not present

## 2019-10-07 DIAGNOSIS — D630 Anemia in neoplastic disease: Secondary | ICD-10-CM | POA: Diagnosis not present

## 2019-10-07 DIAGNOSIS — R627 Adult failure to thrive: Secondary | ICD-10-CM | POA: Diagnosis not present

## 2019-10-07 DIAGNOSIS — C7989 Secondary malignant neoplasm of other specified sites: Secondary | ICD-10-CM | POA: Diagnosis not present

## 2019-10-07 DIAGNOSIS — C19 Malignant neoplasm of rectosigmoid junction: Secondary | ICD-10-CM | POA: Diagnosis not present

## 2019-10-07 DIAGNOSIS — C787 Secondary malignant neoplasm of liver and intrahepatic bile duct: Secondary | ICD-10-CM | POA: Diagnosis not present

## 2019-10-07 DIAGNOSIS — E46 Unspecified protein-calorie malnutrition: Secondary | ICD-10-CM | POA: Diagnosis not present

## 2019-10-08 DIAGNOSIS — E46 Unspecified protein-calorie malnutrition: Secondary | ICD-10-CM | POA: Diagnosis not present

## 2019-10-08 DIAGNOSIS — C7989 Secondary malignant neoplasm of other specified sites: Secondary | ICD-10-CM | POA: Diagnosis not present

## 2019-10-08 DIAGNOSIS — R627 Adult failure to thrive: Secondary | ICD-10-CM | POA: Diagnosis not present

## 2019-10-08 DIAGNOSIS — C19 Malignant neoplasm of rectosigmoid junction: Secondary | ICD-10-CM | POA: Diagnosis not present

## 2019-10-08 DIAGNOSIS — D630 Anemia in neoplastic disease: Secondary | ICD-10-CM | POA: Diagnosis not present

## 2019-10-08 DIAGNOSIS — C787 Secondary malignant neoplasm of liver and intrahepatic bile duct: Secondary | ICD-10-CM | POA: Diagnosis not present

## 2019-10-09 DIAGNOSIS — C7989 Secondary malignant neoplasm of other specified sites: Secondary | ICD-10-CM | POA: Diagnosis not present

## 2019-10-09 DIAGNOSIS — R627 Adult failure to thrive: Secondary | ICD-10-CM | POA: Diagnosis not present

## 2019-10-09 DIAGNOSIS — D630 Anemia in neoplastic disease: Secondary | ICD-10-CM | POA: Diagnosis not present

## 2019-10-09 DIAGNOSIS — C19 Malignant neoplasm of rectosigmoid junction: Secondary | ICD-10-CM | POA: Diagnosis not present

## 2019-10-09 DIAGNOSIS — E46 Unspecified protein-calorie malnutrition: Secondary | ICD-10-CM | POA: Diagnosis not present

## 2019-10-09 DIAGNOSIS — C787 Secondary malignant neoplasm of liver and intrahepatic bile duct: Secondary | ICD-10-CM | POA: Diagnosis not present

## 2019-10-22 DEATH — deceased
